# Patient Record
Sex: Male | Born: 1955 | Race: Black or African American | Hispanic: No | Smoking: Former smoker
Health system: Southern US, Community
[De-identification: ages and names within clinical notes are randomized; demographics above are authoritative.]

## PROBLEM LIST (undated history)

## (undated) DIAGNOSIS — N182 Chronic kidney disease, stage 2 (mild): Secondary | ICD-10-CM

## (undated) DIAGNOSIS — E785 Hyperlipidemia, unspecified: Secondary | ICD-10-CM

## (undated) DIAGNOSIS — I491 Atrial premature depolarization: Secondary | ICD-10-CM

## (undated) DIAGNOSIS — I219 Acute myocardial infarction, unspecified: Secondary | ICD-10-CM

## (undated) DIAGNOSIS — K859 Acute pancreatitis without necrosis or infection, unspecified: Secondary | ICD-10-CM

## (undated) DIAGNOSIS — G629 Polyneuropathy, unspecified: Secondary | ICD-10-CM

## (undated) DIAGNOSIS — G4730 Sleep apnea, unspecified: Secondary | ICD-10-CM

## (undated) DIAGNOSIS — K7460 Unspecified cirrhosis of liver: Secondary | ICD-10-CM

## (undated) DIAGNOSIS — I8393 Asymptomatic varicose veins of bilateral lower extremities: Secondary | ICD-10-CM

## (undated) DIAGNOSIS — E876 Hypokalemia: Secondary | ICD-10-CM

## (undated) DIAGNOSIS — I5032 Chronic diastolic (congestive) heart failure: Secondary | ICD-10-CM

## (undated) DIAGNOSIS — E119 Type 2 diabetes mellitus without complications: Secondary | ICD-10-CM

## (undated) DIAGNOSIS — I10 Essential (primary) hypertension: Secondary | ICD-10-CM

## (undated) DIAGNOSIS — IMO0002 Reserved for concepts with insufficient information to code with codable children: Secondary | ICD-10-CM

## (undated) DIAGNOSIS — R809 Proteinuria, unspecified: Secondary | ICD-10-CM

## (undated) DIAGNOSIS — I2510 Atherosclerotic heart disease of native coronary artery without angina pectoris: Secondary | ICD-10-CM

## (undated) DIAGNOSIS — I4510 Unspecified right bundle-branch block: Secondary | ICD-10-CM

## (undated) HISTORY — PX: CORONARY ARTERY BYPASS GRAFT: SHX141

## (undated) HISTORY — DX: Chronic kidney disease, stage 2 (mild): N18.2

## (undated) HISTORY — DX: Hypomagnesemia: E83.42

## (undated) HISTORY — DX: Chronic diastolic (congestive) heart failure: I50.32

## (undated) HISTORY — DX: Hyperlipidemia, unspecified: E78.5

## (undated) HISTORY — DX: Atrial premature depolarization: I49.1

## (undated) HISTORY — DX: Unspecified cirrhosis of liver: K74.60

## (undated) HISTORY — DX: Hypokalemia: E87.6

## (undated) HISTORY — DX: Atherosclerotic heart disease of native coronary artery without angina pectoris: I25.10

## (undated) HISTORY — DX: Proteinuria, unspecified: R80.9

## (undated) HISTORY — DX: Polyneuropathy, unspecified: G62.9

## (undated) HISTORY — DX: Unspecified right bundle-branch block: I45.10

## (undated) HISTORY — DX: Asymptomatic varicose veins of bilateral lower extremities: I83.93

## (undated) HISTORY — DX: Sleep apnea, unspecified: G47.30

## (undated) HISTORY — DX: Type 2 diabetes mellitus without complications: E11.9

## (undated) HISTORY — PX: OTHER SURGICAL HISTORY: SHX169

## (undated) HISTORY — DX: Acute myocardial infarction, unspecified: I21.9

## (undated) HISTORY — DX: Morbid (severe) obesity due to excess calories: E66.01

## (undated) HISTORY — DX: Acute pancreatitis without necrosis or infection, unspecified: K85.90

## (undated) HISTORY — DX: Reserved for concepts with insufficient information to code with codable children: IMO0002

## (undated) HISTORY — DX: Essential (primary) hypertension: I10

---

## 1997-06-12 ENCOUNTER — Encounter: Admission: RE | Admit: 1997-06-12 | Discharge: 1997-09-10 | Payer: Self-pay | Admitting: Endocrinology

## 1997-11-19 ENCOUNTER — Emergency Department (HOSPITAL_COMMUNITY): Admission: EM | Admit: 1997-11-19 | Discharge: 1997-11-19 | Payer: Self-pay | Admitting: Emergency Medicine

## 1997-11-20 ENCOUNTER — Ambulatory Visit (HOSPITAL_COMMUNITY): Admission: RE | Admit: 1997-11-20 | Discharge: 1997-11-20 | Payer: Self-pay | Admitting: Emergency Medicine

## 1997-12-05 ENCOUNTER — Emergency Department (HOSPITAL_COMMUNITY): Admission: EM | Admit: 1997-12-05 | Discharge: 1997-12-06 | Payer: Self-pay | Admitting: Emergency Medicine

## 1998-02-09 ENCOUNTER — Emergency Department (HOSPITAL_COMMUNITY): Admission: EM | Admit: 1998-02-09 | Discharge: 1998-02-09 | Payer: Self-pay | Admitting: Emergency Medicine

## 1998-04-18 ENCOUNTER — Encounter: Admission: RE | Admit: 1998-04-18 | Discharge: 1998-04-19 | Payer: Self-pay | Admitting: Internal Medicine

## 1998-07-25 ENCOUNTER — Encounter: Admission: RE | Admit: 1998-07-25 | Discharge: 1998-07-25 | Payer: Self-pay | Admitting: Internal Medicine

## 1998-09-28 ENCOUNTER — Encounter: Payer: Self-pay | Admitting: Emergency Medicine

## 1998-09-28 ENCOUNTER — Inpatient Hospital Stay (HOSPITAL_COMMUNITY): Admission: EM | Admit: 1998-09-28 | Discharge: 1998-10-01 | Payer: Self-pay | Admitting: Emergency Medicine

## 1998-09-29 ENCOUNTER — Encounter: Payer: Self-pay | Admitting: Endocrinology

## 1998-10-25 ENCOUNTER — Ambulatory Visit (HOSPITAL_COMMUNITY): Admission: RE | Admit: 1998-10-25 | Discharge: 1998-10-25 | Payer: Self-pay | Admitting: Endocrinology

## 1998-11-05 ENCOUNTER — Emergency Department (HOSPITAL_COMMUNITY): Admission: EM | Admit: 1998-11-05 | Discharge: 1998-11-05 | Payer: Self-pay | Admitting: Emergency Medicine

## 1998-11-05 ENCOUNTER — Encounter: Admission: RE | Admit: 1998-11-05 | Discharge: 1999-02-03 | Payer: Self-pay | Admitting: Orthopedic Surgery

## 1998-11-14 ENCOUNTER — Emergency Department (HOSPITAL_COMMUNITY): Admission: EM | Admit: 1998-11-14 | Discharge: 1998-11-14 | Payer: Self-pay | Admitting: Emergency Medicine

## 1998-11-15 ENCOUNTER — Encounter: Payer: Self-pay | Admitting: Emergency Medicine

## 1999-01-30 ENCOUNTER — Ambulatory Visit: Admission: RE | Admit: 1999-01-30 | Discharge: 1999-01-30 | Payer: Self-pay | Admitting: Internal Medicine

## 1999-02-19 ENCOUNTER — Encounter: Admission: RE | Admit: 1999-02-19 | Discharge: 1999-05-20 | Payer: Self-pay | Admitting: Orthopedic Surgery

## 1999-06-10 ENCOUNTER — Encounter: Admission: RE | Admit: 1999-06-10 | Discharge: 1999-09-08 | Payer: Self-pay | Admitting: Orthopedic Surgery

## 2000-08-06 ENCOUNTER — Encounter: Admission: RE | Admit: 2000-08-06 | Discharge: 2000-08-06 | Payer: Self-pay | Admitting: Internal Medicine

## 2000-09-21 ENCOUNTER — Encounter: Admission: RE | Admit: 2000-09-21 | Discharge: 2000-09-21 | Payer: Self-pay | Admitting: Internal Medicine

## 2000-09-30 ENCOUNTER — Encounter: Admission: RE | Admit: 2000-09-30 | Discharge: 2000-09-30 | Payer: Self-pay | Admitting: Internal Medicine

## 2000-11-17 ENCOUNTER — Encounter: Admission: RE | Admit: 2000-11-17 | Discharge: 2000-11-17 | Payer: Self-pay | Admitting: Internal Medicine

## 2001-02-04 ENCOUNTER — Emergency Department (HOSPITAL_COMMUNITY): Admission: EM | Admit: 2001-02-04 | Discharge: 2001-02-04 | Payer: Self-pay

## 2001-03-05 ENCOUNTER — Emergency Department (HOSPITAL_COMMUNITY): Admission: EM | Admit: 2001-03-05 | Discharge: 2001-03-05 | Payer: Self-pay | Admitting: Emergency Medicine

## 2001-03-05 ENCOUNTER — Encounter: Payer: Self-pay | Admitting: Emergency Medicine

## 2001-04-07 ENCOUNTER — Emergency Department (HOSPITAL_COMMUNITY): Admission: EM | Admit: 2001-04-07 | Discharge: 2001-04-07 | Payer: Self-pay | Admitting: Emergency Medicine

## 2001-04-07 ENCOUNTER — Encounter: Payer: Self-pay | Admitting: Emergency Medicine

## 2001-06-28 ENCOUNTER — Encounter (HOSPITAL_BASED_OUTPATIENT_CLINIC_OR_DEPARTMENT_OTHER): Admission: RE | Admit: 2001-06-28 | Discharge: 2001-07-01 | Payer: Self-pay | Admitting: Internal Medicine

## 2001-10-11 ENCOUNTER — Encounter (HOSPITAL_BASED_OUTPATIENT_CLINIC_OR_DEPARTMENT_OTHER): Admission: RE | Admit: 2001-10-11 | Discharge: 2002-01-09 | Payer: Self-pay | Admitting: Orthopedic Surgery

## 2001-11-19 ENCOUNTER — Emergency Department (HOSPITAL_COMMUNITY): Admission: EM | Admit: 2001-11-19 | Discharge: 2001-11-19 | Payer: Self-pay | Admitting: Emergency Medicine

## 2001-12-13 ENCOUNTER — Emergency Department (HOSPITAL_COMMUNITY): Admission: EM | Admit: 2001-12-13 | Discharge: 2001-12-14 | Payer: Self-pay | Admitting: Emergency Medicine

## 2001-12-14 ENCOUNTER — Encounter: Payer: Self-pay | Admitting: Emergency Medicine

## 2001-12-14 ENCOUNTER — Emergency Department (HOSPITAL_COMMUNITY): Admission: EM | Admit: 2001-12-14 | Discharge: 2001-12-14 | Payer: Self-pay | Admitting: Emergency Medicine

## 2002-03-02 ENCOUNTER — Encounter: Admission: RE | Admit: 2002-03-02 | Discharge: 2002-03-02 | Payer: Self-pay | Admitting: Internal Medicine

## 2002-05-09 ENCOUNTER — Emergency Department (HOSPITAL_COMMUNITY): Admission: EM | Admit: 2002-05-09 | Discharge: 2002-05-09 | Payer: Self-pay | Admitting: Emergency Medicine

## 2002-10-25 ENCOUNTER — Emergency Department (HOSPITAL_COMMUNITY): Admission: EM | Admit: 2002-10-25 | Discharge: 2002-10-25 | Payer: Self-pay | Admitting: Emergency Medicine

## 2003-01-19 ENCOUNTER — Encounter (HOSPITAL_BASED_OUTPATIENT_CLINIC_OR_DEPARTMENT_OTHER): Admission: RE | Admit: 2003-01-19 | Discharge: 2003-04-19 | Payer: Self-pay | Admitting: Internal Medicine

## 2003-01-26 ENCOUNTER — Encounter: Admission: RE | Admit: 2003-01-26 | Discharge: 2003-01-26 | Payer: Self-pay | Admitting: Internal Medicine

## 2003-02-11 DIAGNOSIS — I219 Acute myocardial infarction, unspecified: Secondary | ICD-10-CM

## 2003-02-11 HISTORY — DX: Acute myocardial infarction, unspecified: I21.9

## 2003-02-14 ENCOUNTER — Encounter: Admission: RE | Admit: 2003-02-14 | Discharge: 2003-02-14 | Payer: Self-pay | Admitting: Internal Medicine

## 2003-02-23 ENCOUNTER — Ambulatory Visit (HOSPITAL_BASED_OUTPATIENT_CLINIC_OR_DEPARTMENT_OTHER): Admission: RE | Admit: 2003-02-23 | Discharge: 2003-02-23 | Payer: Self-pay | Admitting: Internal Medicine

## 2003-03-01 ENCOUNTER — Inpatient Hospital Stay (HOSPITAL_COMMUNITY): Admission: AD | Admit: 2003-03-01 | Discharge: 2003-03-14 | Payer: Self-pay | Admitting: *Deleted

## 2003-09-20 ENCOUNTER — Encounter (HOSPITAL_BASED_OUTPATIENT_CLINIC_OR_DEPARTMENT_OTHER): Admission: RE | Admit: 2003-09-20 | Discharge: 2003-12-19 | Payer: Self-pay | Admitting: Internal Medicine

## 2003-12-19 ENCOUNTER — Encounter (HOSPITAL_BASED_OUTPATIENT_CLINIC_OR_DEPARTMENT_OTHER): Admission: RE | Admit: 2003-12-19 | Discharge: 2004-01-10 | Payer: Self-pay | Admitting: Internal Medicine

## 2004-01-02 ENCOUNTER — Ambulatory Visit: Payer: Self-pay | Admitting: *Deleted

## 2004-01-03 ENCOUNTER — Ambulatory Visit: Payer: Self-pay

## 2004-01-24 ENCOUNTER — Ambulatory Visit: Payer: Self-pay | Admitting: Cardiology

## 2004-02-08 ENCOUNTER — Ambulatory Visit: Payer: Self-pay | Admitting: *Deleted

## 2004-03-05 ENCOUNTER — Encounter (HOSPITAL_BASED_OUTPATIENT_CLINIC_OR_DEPARTMENT_OTHER): Admission: RE | Admit: 2004-03-05 | Discharge: 2004-04-19 | Payer: Self-pay | Admitting: Internal Medicine

## 2004-07-15 ENCOUNTER — Ambulatory Visit: Payer: Self-pay | Admitting: *Deleted

## 2004-07-18 ENCOUNTER — Ambulatory Visit: Payer: Self-pay | Admitting: *Deleted

## 2004-08-19 ENCOUNTER — Encounter: Admission: RE | Admit: 2004-08-19 | Discharge: 2004-08-19 | Payer: Self-pay | Admitting: Endocrinology

## 2004-11-11 ENCOUNTER — Ambulatory Visit: Payer: Self-pay | Admitting: Cardiology

## 2005-04-22 ENCOUNTER — Ambulatory Visit: Payer: Self-pay | Admitting: Cardiology

## 2005-11-04 ENCOUNTER — Ambulatory Visit: Payer: Self-pay | Admitting: Cardiovascular Disease

## 2005-11-17 ENCOUNTER — Ambulatory Visit: Payer: Self-pay

## 2005-11-27 ENCOUNTER — Ambulatory Visit: Payer: Self-pay | Admitting: Cardiology

## 2006-01-07 ENCOUNTER — Ambulatory Visit: Payer: Self-pay | Admitting: Cardiology

## 2006-04-13 ENCOUNTER — Ambulatory Visit: Payer: Self-pay | Admitting: Cardiology

## 2006-08-21 ENCOUNTER — Ambulatory Visit: Payer: Self-pay | Admitting: Internal Medicine

## 2006-12-24 ENCOUNTER — Ambulatory Visit: Payer: Self-pay | Admitting: Cardiology

## 2006-12-31 ENCOUNTER — Ambulatory Visit: Payer: Self-pay

## 2007-01-04 ENCOUNTER — Encounter: Payer: Self-pay | Admitting: Internal Medicine

## 2007-01-04 DIAGNOSIS — K7460 Unspecified cirrhosis of liver: Secondary | ICD-10-CM | POA: Insufficient documentation

## 2007-01-04 DIAGNOSIS — Z794 Long term (current) use of insulin: Secondary | ICD-10-CM

## 2007-01-04 DIAGNOSIS — E785 Hyperlipidemia, unspecified: Secondary | ICD-10-CM | POA: Insufficient documentation

## 2007-01-04 DIAGNOSIS — I10 Essential (primary) hypertension: Secondary | ICD-10-CM

## 2007-01-04 DIAGNOSIS — E1149 Type 2 diabetes mellitus with other diabetic neurological complication: Secondary | ICD-10-CM

## 2007-01-04 DIAGNOSIS — G4733 Obstructive sleep apnea (adult) (pediatric): Secondary | ICD-10-CM | POA: Insufficient documentation

## 2007-01-04 DIAGNOSIS — I219 Acute myocardial infarction, unspecified: Secondary | ICD-10-CM | POA: Insufficient documentation

## 2007-01-04 DIAGNOSIS — J40 Bronchitis, not specified as acute or chronic: Secondary | ICD-10-CM | POA: Insufficient documentation

## 2007-01-04 DIAGNOSIS — K859 Acute pancreatitis without necrosis or infection, unspecified: Secondary | ICD-10-CM | POA: Insufficient documentation

## 2007-02-08 ENCOUNTER — Ambulatory Visit: Payer: Self-pay | Admitting: Internal Medicine

## 2007-02-12 ENCOUNTER — Encounter: Payer: Self-pay | Admitting: Internal Medicine

## 2007-06-03 ENCOUNTER — Ambulatory Visit: Payer: Self-pay | Admitting: Internal Medicine

## 2007-06-24 ENCOUNTER — Encounter: Payer: Self-pay | Admitting: Internal Medicine

## 2007-09-02 ENCOUNTER — Encounter: Payer: Self-pay | Admitting: Internal Medicine

## 2007-12-23 ENCOUNTER — Ambulatory Visit: Payer: Self-pay | Admitting: Cardiology

## 2008-01-14 ENCOUNTER — Ambulatory Visit: Payer: Self-pay | Admitting: Cardiology

## 2008-09-07 ENCOUNTER — Telehealth: Payer: Self-pay | Admitting: Cardiology

## 2008-09-20 ENCOUNTER — Telehealth: Payer: Self-pay | Admitting: Cardiology

## 2008-09-26 ENCOUNTER — Telehealth: Payer: Self-pay | Admitting: Cardiology

## 2008-10-19 ENCOUNTER — Telehealth: Payer: Self-pay | Admitting: Internal Medicine

## 2008-10-19 ENCOUNTER — Encounter: Payer: Self-pay | Admitting: Internal Medicine

## 2008-10-20 ENCOUNTER — Ambulatory Visit: Payer: Self-pay | Admitting: Internal Medicine

## 2008-11-09 ENCOUNTER — Emergency Department (HOSPITAL_COMMUNITY): Admission: EM | Admit: 2008-11-09 | Discharge: 2008-11-09 | Payer: Self-pay | Admitting: Emergency Medicine

## 2008-11-14 ENCOUNTER — Encounter: Payer: Self-pay | Admitting: Internal Medicine

## 2008-12-25 DIAGNOSIS — I2510 Atherosclerotic heart disease of native coronary artery without angina pectoris: Secondary | ICD-10-CM

## 2008-12-26 ENCOUNTER — Ambulatory Visit: Payer: Self-pay | Admitting: Cardiology

## 2009-03-05 ENCOUNTER — Telehealth: Payer: Self-pay | Admitting: Cardiology

## 2009-04-05 ENCOUNTER — Inpatient Hospital Stay (HOSPITAL_COMMUNITY): Admission: EM | Admit: 2009-04-05 | Discharge: 2009-04-06 | Payer: Self-pay | Admitting: Emergency Medicine

## 2009-04-05 ENCOUNTER — Telehealth: Payer: Self-pay | Admitting: Cardiology

## 2009-04-05 ENCOUNTER — Encounter: Payer: Self-pay | Admitting: Cardiology

## 2009-04-05 ENCOUNTER — Ambulatory Visit: Payer: Self-pay | Admitting: Cardiovascular Disease

## 2009-04-30 ENCOUNTER — Ambulatory Visit: Payer: Self-pay | Admitting: Cardiology

## 2009-04-30 DIAGNOSIS — I739 Peripheral vascular disease, unspecified: Secondary | ICD-10-CM | POA: Insufficient documentation

## 2009-07-16 ENCOUNTER — Encounter: Admission: RE | Admit: 2009-07-16 | Discharge: 2009-07-16 | Payer: Self-pay | Admitting: Orthopedic Surgery

## 2009-10-19 ENCOUNTER — Telehealth (INDEPENDENT_AMBULATORY_CARE_PROVIDER_SITE_OTHER): Payer: Self-pay | Admitting: *Deleted

## 2009-10-23 ENCOUNTER — Telehealth (INDEPENDENT_AMBULATORY_CARE_PROVIDER_SITE_OTHER): Payer: Self-pay | Admitting: *Deleted

## 2009-10-26 ENCOUNTER — Encounter: Payer: Self-pay | Admitting: Internal Medicine

## 2009-12-27 ENCOUNTER — Ambulatory Visit: Payer: Self-pay | Admitting: Cardiology

## 2010-01-07 ENCOUNTER — Telehealth (INDEPENDENT_AMBULATORY_CARE_PROVIDER_SITE_OTHER): Payer: Self-pay

## 2010-01-08 ENCOUNTER — Ambulatory Visit: Payer: Self-pay | Admitting: Cardiology

## 2010-01-08 ENCOUNTER — Ambulatory Visit: Payer: Self-pay

## 2010-01-08 ENCOUNTER — Encounter: Payer: Self-pay | Admitting: Internal Medicine

## 2010-01-08 ENCOUNTER — Encounter: Payer: Self-pay | Admitting: *Deleted

## 2010-01-08 ENCOUNTER — Encounter (HOSPITAL_COMMUNITY)
Admission: RE | Admit: 2010-01-08 | Discharge: 2010-03-12 | Payer: Self-pay | Source: Home / Self Care | Attending: Cardiology | Admitting: Cardiology

## 2010-01-08 ENCOUNTER — Encounter: Payer: Self-pay | Admitting: Cardiology

## 2010-01-10 ENCOUNTER — Telehealth (INDEPENDENT_AMBULATORY_CARE_PROVIDER_SITE_OTHER): Payer: Self-pay | Admitting: Radiology

## 2010-01-10 LAB — CONVERTED CEMR LAB
Alkaline Phosphatase: 79 units/L (ref 39–117)
Bilirubin, Direct: 0.1 mg/dL (ref 0.0–0.3)
HDL: 40.9 mg/dL (ref >39.00)
LDL Cholesterol: 80 mg/dL (ref 0–99)
Total Bilirubin: 0.6 mg/dL (ref 0.3–1.2)
Total CHOL/HDL Ratio: 4
Total Protein: 7.3 g/dL (ref 6.0–8.3)
Triglycerides: 166 mg/dL — ABNORMAL HIGH (ref 0.0–149.0)
VLDL: 33.2 mg/dL (ref 0.0–40.0)

## 2010-01-21 ENCOUNTER — Encounter: Payer: Self-pay | Admitting: Cardiology

## 2010-03-03 ENCOUNTER — Encounter: Payer: Self-pay | Admitting: Orthopedic Surgery

## 2010-03-12 ENCOUNTER — Telehealth: Payer: Self-pay | Admitting: Cardiology

## 2010-03-12 NOTE — Progress Notes (Signed)
Summary: Nuclear results  Phone Note Call from Patient Call back at Home Phone [phone]   Caller: Patient Call For: MD Summary of Call: Pt called this AM wanting Stress test and Lab results. Told him Dr. Jenene Slicker nurse would be in touch. He stated it would be okay to leave results on Answering machine if he was not there. Initial call taken by: Leonia Corona, RT-N,  January 10, 2010 8:41 AM

## 2010-03-12 NOTE — Assessment & Plan Note (Signed)
Summary: Cardiology Nuclear Testing   Nuclear Med Background Indications for Stress Test: Evaluation for Ischemia, Graft Patency   History: Abnormal EKG, CABG, Heart Catheterization, Myocardial Infarction, Myocardial Perfusion Study  History Comments: '05 Cath>CABG x5. 11/20/05 MPS: (-) ischemia.  Symptoms: DOE, Palpitations, SOB    Nuclear Pre-Procedure Cardiac Risk Factors: Claudication, Family History - CAD, History of Smoking, Hypertension, IDDM Type 2, Lipids, PVD, RBBB Caffeine/Decaff Intake: 8:30 PM  NPO After: 11:00 PM Lungs: clear IV 0.9% NS with Angio Cath: 22g     IV Site: R Antecubital IV Started by: Irean Hong, RN Chest Size (in) 52     Height (in): 71 Weight (lb): 285 BMI: 39.89 Tech Comments: Held metoprolol this am.   Nuclear Med Study 1 or 2 day study:  1 day     Stress Test Type:  Treadmill/Lexiscan Reading MD:  Cassell Clement, MD     Referring MD:  J.Hochrein Resting Radionuclide:  Technetium 8m Tetrofosmin     Resting Radionuclide Dose:  11.0 mCi  Stress Radionuclide:  Technetium 29m Tetrofosmin     Stress Radionuclide Dose:  33.0 mCi   Stress Protocol  Max Systolic BP: 181 mm Hg Lexiscan: 0.4 mg   Stress Test Technologist:  Milana Na, EMT-P     Nuclear Technologist:  Doyne Keel, CNMT  Rest Procedure  Myocardial perfusion imaging was performed at rest 45 minutes following the intravenous administration of Technetium 38m Tetrofosmin.  Stress Procedure  The patient received IV Lexiscan 0.4 mg over 15-seconds with concurrent low level exercise and then Technetium 26m Tetrofosmin was injected at 30-seconds while the patient continued walking one more minute.  There were no significant changes and rare pacs with Lexiscan.  Quantitative spect images were obtained after a 45 minute delay.  QPS Raw Data Images:  Normal; no motion artifact; normal heart/lung ratio. Stress Images:  Normal homogeneous uptake in all areas of the myocardium. Rest  Images:  Normal homogeneous uptake in all areas of the myocardium. Subtraction (SDS):  No evidence of ischemia. Transient Ischemic Dilatation:  1.07  (Normal <1.22)  Lung/Heart Ratio:  0.34  (Normal <0.45)  Quantitative Gated Spect Images QGS EDV:  115 ml QGS ESV:  48 ml QGS EF:  58 % QGS cine images:  Normal LV systolic function.  Findings Normal nuclear study      Overall Impression  Exercise Capacity: Lexiscan with mild exercise. BP Response: Normal blood pressure response. Clinical Symptoms: No chest pain ECG Impression: No significant ST segment change suggestive of ischemia.  RBBB. Overall Impression: Normal stress nuclear study. Overall Impression Comments: No ischemia.  Normal LV systolic function.  Appended Document: Cardiology Nuclear Testing  Called patient and left message on machine of normal results

## 2010-03-12 NOTE — Assessment & Plan Note (Signed)
Summary: 1 yr      Allergies Added:   Visit Type:  Follow-up  CC:  CAD.  History of Present Illness: The patient presents for followup of his known coronary disease. He was hospitalized earlier this year with chest pain but ruled out. He has not had a stress perfusion study in over 4 years. His bypass grafts are now 57 years old. He is not exercising as I have encouraged him to do. With his activities of daily living he is not getting chest pressure, neck or arm discomfort. He is not having any new palpitations, presyncope or syncope. He is having no new PND or orthopnea. He has had slowly progressive weight gain. He does have leg pain when he walks. This is particularly in left side in the popliteal region. He had normal ABIs in 2009.  Current Medications (verified): 1)  Cozaar 50 Mg Tabs (Losartan Potassium) .... Take One Tablet By Mouth Daily 2)  Aspirin Ec 325 Mg Tbec (Aspirin) .... Take One Tablet By Mouth Daily 3)  Inspra 25 Mg  Tabs (Eplerenone) .Marland Kitchen.. 1 By Mouth Daily 4)  Simvastatin 80 Mg  Tabs (Simvastatin) .... Take 1 Tablet By Mouth Once A Day 5)  Furosemide 20 Mg Tabs (Furosemide) .... Take 1 Tablet By Mouth Two Times A Day 6)  Humalog Mix 75/25 Pen 75-25 % Susp (Insulin Lispro Prot & Lispro) .... As Directed 7)  Nitrostat 0.4 Mg Subl (Nitroglycerin) .Marland Kitchen.. 1 Tablet Under Tongue At Onset of Chest Pain; You May Repeat Every 5 Minutes For Up To 3 Doses. 8)  Metoprolol Tartrate 50 Mg Tabs (Metoprolol Tartrate) .... Take 1 Tablet By Mouth Once A Day 9)  Bipap 12/ 5 [facility] 10)  Lantus 100 Unit/ml Soln (Insulin Glargine) .... As Directed 11)  Replacement Bpap Mask, Filter and Supplies.  Allergies (verified): 1)  ! Ibuprofen  Past History:  Past Medical History: Reviewed history from 12/25/2008 and no changes required. CAD (ICD-414.00 (status post CABG in 2005 with a LIMA to       the LAD, left radial to second circumflex marginal, saphenous vein       graft to PDA, saphenous vein  graft to lateral subbranch of the       ramus intermediate, and sequential saphenous vein graft to the       medial subbranch of the ramus intermediate).  PANCREATITIS (ICD-577.0) CIRRHOSIS (ICD-571.5) DM (ICD-250.00) BRONCHITIS (ICD-490) HYPERLIPIDEMIA (ICD-272.4) SLEEP APNEA (ICD-780.57) MI (ICD-410.90) HYPERTENSION NEC (ICD-997.91)  Past Surgical History: Reviewed history from 01/04/2007 and no changes required. ENT Surgery Cornary Bypass Graft  Review of Systems       As stated in the HPI and negative for all other systems.   Vital Signs:  Patient profile:   55 year old male Height:      71 inches Weight:      282 pounds BMI:     39.47 Pulse rate:   70 / minute Resp:     18 per minute BP sitting:   102 / 68  (right arm)  Vitals Entered By: Marrion Coy, CNA (December 27, 2009 10:12 AM)  Physical Exam  General:  Well developed, well nourished, in no acute distress. Head:  normocephalic and atraumatic Eyes:  PERRLA/EOM intact; conjunctiva and lids normal. Mouth:  Poor dentition. Oral mucosa normal. Neck:  Neck supple, no JVD. No masses, thyromegaly or abnormal cervical nodes. Chest Wall:  Well healed sternotomy scar Lungs:  Clear bilaterally to auscultation and percussion. Abdomen:  Bowel  sounds positive; abdomen soft and non-tender without masses, organomegaly, or hernias noted. No hepatosplenomegaly. obese Msk:  Back normal, normal gait. Muscle strength and tone normal. Extremities:  No clubbing or cyanosis. Neurologic:  Alert and oriented x 3. Skin:  Intact without lesions or rashes. Psych:  Normal affect.   Detailed Cardiovascular Exam  Neck    Carotids: Carotids full and equal bilaterally without bruits.      Neck Veins: Normal, no JVD.    Heart    Inspection: no deformities or lifts noted.      Palpation: normal PMI with no thrills palpable.      Auscultation: regular rate and rhythm, S1, S2 without murmurs, rubs, gallops, or clicks.    Vascular     Abdominal Aorta: no palpable masses, pulsations, or audible bruits.      Femoral Pulses: normal femoral pulses bilaterally.      Pedal Pulses: diminished left dorsalis pedis pulse and diminished left posterior tibial pulse.      Radial Pulses: absent left radial pulse.      Peripheral Circulation: no clubbing, cyanosis, or edema noted with normal capillary refill.     EKG  Procedure date:  12/27/2009  Findings:      Normal sinus rhythm, rate 77, axis within normal limits, and intervals within normal limits, left ventricular hypertrophy by voltage criteria, and QT prolonged, nonspecific T-wave changes  Impression & Recommendations:  Problem # 1:  CAD (ICD-414.00) The patient is having no new symptoms. However, he is not particularly active. I want him to be on an exercise regimen. He needs screening with a stress test prior to starting this given his past history. However, he says he would not be able to walk on a treadmill. Therefore, he will have a pharmacologic stress perfusion study. Orders: Nuclear Stress Test (Nuc Stress Test) EKG w/ Interpretation (93000)  Problem # 2:  OBESITY, UNSPECIFIED (ICD-278.00) We had a long discussion about the need for weight loss with diet and exercise.  Problem # 3:  PVD (ICD-443.9) He does have palpable pulses with slightly diminished bilaterally. He had normal ABIs 2009. I am going to encourage walking first and if he gets worse I will screen with further imaging.  Problem # 4:  DM (ICD-250.00) His primary physician is no longer working. I have given him the name of one of our physicians to follow up with as a new patient to manage his diabetes and other primary care issues.  Problem # 5:  HYPERLIPIDEMIA (ICD-272.4) I will check a lipid profile. He has been on 80 mg of simvastatin for greater than one year and has no symptoms and can continue this. I will check liver enzymes.  Problem # 6:  HYPERTENSION NEC (ICD-997.91) His blood pressure is  controlled. He will continue the meds as listed. Orders: EKG w/ Interpretation (93000)  Patient Instructions: 1)  Your physician recommends that you schedule a follow-up appointment in: 12 months with Dr Antoine Poche 2)  Your physician recommends that you return for a FASTING lipid and liver profile: same day as stress test  272.0 v58.69 3)  Your physician recommends that you continue on your current medications as directed. Please refer to the Current Medication list given to you today. 4)  You have been referred to [facility] [address] to establish 5)  Your physician has requested that you have an adenosine myoview.  For further information please visit [email or web address]  Please follow instruction sheet, as given.

## 2010-03-12 NOTE — Progress Notes (Signed)
Summary: tightness in chest   Phone Note Call from Patient Call back at Home Phone [phone]   Caller: Patient Reason for Call: Talk to Nurse Details for Reason: per pt calling, c/o tightness by breast area, gas pocket,  Initial call taken by: Lorne Skeens,  April 05, 2009 4:48 PM  Follow-up for Phone Call        spoke w/pt has been having chest tightness under left breast all day, he is not sure if it's gas or his heart, but he is very concerned, he has had a bowel movement today, pt feels if it gets worse he may go to ER advised he should go ahead on over and get checked out, pt agreeable, Bjorn Loser is aware, note faxed to ER Meredith Staggers, RN  April 05, 2009 4:55 PM

## 2010-03-12 NOTE — Progress Notes (Signed)
Summary: bi-pap supplies  Phone Note Call from Patient Call back at [phone]   Caller: Patient Call For: young Summary of Call: need bi-pap prescript [facility] to [facility] Initial call taken by: Rickard Patience,  October 23, 2009 2:56 PM  Follow-up for Phone Call        RX signed by CDY and faxed to [facility] at [phone].Marland Kitchen Pt is aware.Michel Bickers [facility]  October 23, 2009 3:29 PM    New/Updated Medications: * REPLACEMENT BPAP MASK, FILTER AND SUPPLIES.  Prescriptions: REPLACEMENT BPAP MASK, FILTER AND SUPPLIES.   #1 x 0   Entered by:   Michel Bickers CMA   Authorized by:   Waymon Budge MD   Signed by:   Michel Bickers CMA on 10/23/2009   Method used:   Printed then faxed to ...       Chief Executive Officer Environmental education officer)       [address]       [place], [place]  [address]       Ph: [phone]       Fax: [phone]   RxID:   [id number] REPLACEMENT CPAP MASK, FILTER AND SUPPLIES.   #1 x 0   Entered by:   Michel Bickers CMA   Authorized by:   Waymon Budge MD   Signed by:   Michel Bickers CMA on 10/23/2009   Method used:   Printed then faxed to ...       Chief Executive Officer Environmental education officer)       [address]       [place], [place]  [address]       Ph: [phone]       Fax: [phone]   RxID:   [id number]   Appended Document: Orders Update- using BiPAP 12/5 [facility] Medications Added * BIPAP 12/ 5 [facility]        Update med list   Clinical Lists Changes  Medications: Changed medication from * CPAP [facility] unsure of setting.. to * BIPAP 12/ 5 [facility]

## 2010-03-12 NOTE — Letter (Signed)
Summary: ER Notification  Architectural technologist, Main Office  [address]   [place], [place] [address]   Phone: [phone]  Fax: [phone]    April 05, 2009 4:56 PM  [name] [name]  The above referenced patient has been advised to report directly to the Emergency Room. Please see below for more information:  Dx: ____chest tightness________     Private Vehicle  ______xxx_________ or EMS:  ________________   Orders:  Yes ______ or No  __xxx_____   Notify upon arrival:    Trish [phone]       Or _________________   Thank you,    [facility]

## 2010-03-12 NOTE — Progress Notes (Signed)
Summary: rx for bipap supplies  Phone Note Call from Patient Call back at Home Phone [phone]   Caller: Patient Call For: young Reason for Call: Talk to Nurse Summary of Call: pt needs rx for bipap supplies...mask, filters, etc/...  Send order/rx to [facility] Initial call taken by: Eugene Gavia,  October 19, 2009 11:02 AM  Follow-up for Phone Call        Spoke with pt's brother.  He states that [place] needs new order for bipap mask and filter.  Pt was last seen 9/10' and never followed up.  Is it okay to send order now as long as he schedules an rov? Pls advise thanks Follow-up by: Vernie Murders,  October 19, 2009 11:27 AM  Additional Follow-up for Phone Call Additional follow up Details #1::        OK- please get the appointment made. I am passing this on to [facility] I put order on med list Additional Follow-up by: Waymon Budge MD,  October 19, 2009 1:04 PM    Additional Follow-up for Phone Call Additional follow up Details #2::    order faxed to [facility] for cpap supplies  Follow-up by: Oneita Jolly,  October 19, 2009 3:34 PM  New/Updated Medications: * REPLACEMENT CPAP MASK, FILTER AND SUPPLIES.

## 2010-03-12 NOTE — Assessment & Plan Note (Signed)
Summary: eph/jss  Medications Added LANTUS 100 UNIT/ML SOLN (INSULIN GLARGINE) as directed        Visit Type:  Follow-up Primary Provider:  Dr. Joselyn Arrow  CC:  CAD.  History of Present Illness: The patient presents for followup of known coronary disease. Since I last saw him he was hospitalized overnight and ruled out for a myocardial infarction. Since that time he has had no further chest discomfort. He denies any neck or arm discomfort. Said no palpitations, presyncope or syncope. He walks to his neighbors house routinely and with this has had no symptoms. He says is about 3 blocks and involves a hill. He denies any resting shortness of breath and has had no PND or orthopnea.  Problems Prior to Update: 1)  Obesity, Unspecified  (ICD-278.00) 2)  Cad  (ICD-414.00) 3)  Pancreatitis  (ICD-577.0) 4)  Cirrhosis  (ICD-571.5) 5)  Dm  (ICD-250.00) 6)  Bronchitis  (ICD-490) 7)  Hyperlipidemia  (ICD-272.4) 8)  Sleep Apnea  (ICD-780.57) 9)  Mi  (ICD-410.90) 10)  Hypertension Nec  (ICD-997.91)  Current Medications (verified): 1)  Cozaar 50 Mg Tabs (Losartan Potassium) .... Take One Tablet By Mouth Daily 2)  Aspirin Ec 325 Mg Tbec (Aspirin) .... Take One Tablet By Mouth Daily 3)  Inspra 25 Mg  Tabs (Eplerenone) .Marland Kitchen.. 1 By Mouth Daily 4)  Simvastatin 80 Mg  Tabs (Simvastatin) .... Take 1 Tablet By Mouth Once A Day 5)  Furosemide 20 Mg Tabs (Furosemide) .... Take 1 Tablet By Mouth Two Times A Day 6)  Humalog Mix 75/25 Pen 75-25 % Susp (Insulin Lispro Prot & Lispro) .... As Directed 7)  Nitrostat 0.4 Mg Subl (Nitroglycerin) .Marland Kitchen.. 1 Tablet Under Tongue At Onset of Chest Pain; You May Repeat Every 5 Minutes For Up To 3 Doses. 8)  Metoprolol Tartrate 50 Mg Tabs (Metoprolol Tartrate) .... Take 1 Tablet By Mouth Once A Day 9)  Cpap [facility] .... Unsure of Setting.. 10)  Lantus 100 Unit/ml Soln (Insulin Glargine) .... As Directed  Allergies: 1)  ! Ibuprofen  Past History:  Past Medical  History: Reviewed history from 12/25/2008 and no changes required. CAD (ICD-414.00 (status post CABG in 2005 with a LIMA to       the LAD, left radial to second circumflex marginal, saphenous vein       graft to PDA, saphenous vein graft to lateral subbranch of the       ramus intermediate, and sequential saphenous vein graft to the       medial subbranch of the ramus intermediate).  PANCREATITIS (ICD-577.0) CIRRHOSIS (ICD-571.5) DM (ICD-250.00) BRONCHITIS (ICD-490) HYPERLIPIDEMIA (ICD-272.4) SLEEP APNEA (ICD-780.57) MI (ICD-410.90) HYPERTENSION NEC (ICD-997.91)  Past Surgical History: Reviewed history from 01/04/2007 and no changes required. ENT Surgery Cornary Bypass Graft  Review of Systems       As stated in the HPI and negative for all other systems.   Vital Signs:  Patient profile:   55 year old male Height:      71 inches Weight:      280 pounds BMI:     39.19 Pulse rate:   78 / minute BP sitting:   106 / 69  (right arm) Cuff size:   large  Vitals Entered By: Oswald Hillock (April 30, 2009 4:53 PM)  Physical Exam  General:  Well developed, well nourished, in no acute distress. Head:  normocephalic and atraumatic Eyes:  PERRLA/EOM intact; conjunctiva and lids normal. Mouth:  Teeth, gums and palate normal. Oral mucosa  normal. Neck:  Neck supple, no JVD. No masses, thyromegaly or abnormal cervical nodes. Chest Wall:  Well healed sternotomy scar Lungs:  Clear bilaterally to auscultation and percussion. Heart:  regular rate and rhythm, S1, S2 without murmurs, rubs, gallops, or clicks Abdomen:  Bowel sounds positive; abdomen soft and non-tender without masses, organomegaly, or hernias noted. No hepatosplenomegaly. obese Msk:  Back normal, normal gait. Muscle strength and tone normal. Pulses:  diminished left dorsalis pedis pulse and diminished left posterior tibial pulse.   Extremities:  No clubbing or cyanosis. Neurologic:  Alert and oriented x 3. Skin:   Intact without lesions or rashes. Psych:  Normal affect.   Impression & Recommendations:  Problem # 1:  CAD (ICD-414.00) The patient has had no new symptoms. Since I last saw him he is perhaps precipitating slightly more aggressively and risk reduction. At this point no further cardiovascular testing is suggested. Orders: EKG w/ Interpretation (93000)  Problem # 2:  OBESITY, UNSPECIFIED (ICD-278.00) I gave him specific instructions on how to lose weight. Hopefully he will comply.  Problem # 3:  HYPERTENSION NEC (ICD-997.91) His blood pressure is controlled on the meds as listed. He should at weight loss to his blood pressure treatment.  Problem # 4:  HYPERLIPIDEMIA (ICD-272.4) I reviewed the lipids that were done in the hospital. He is at target at his lipids. He would not be able to afford therapeutic equivalent doses of Lipitor or Crestor so he will remain on the simvastatin despite the black box warning.  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 He does get some claudication but it has been quite stable. At the time he did see Dr. Clovis Riley and it was suggested that he continue conservative management. He will continue with this.  Patient Instructions: 1)  Your physician recommends that you schedule a follow-up appointment in: 1 year with Dr Antoine Poche 2)  Your physician recommends that you continue on your current medications as directed. Please refer to the Current Medication list given to you today.

## 2010-03-12 NOTE — Letter (Signed)
Summary: LMN/[facility]  LMN/[facility]   Imported By: Lester Hartford 11/01/2009 11:30:31  _____________________________________________________________________  External Attachment:    Type:   Image     Comment:   External Document

## 2010-03-12 NOTE — Progress Notes (Signed)
Summary: CALLING ABOUT MEDICATION  Medications Added FUROSEMIDE 20 MG TABS (FUROSEMIDE) Take 1 tablet by mouth two times a day       Phone Note Call from Patient Call back at [phone]   Caller: Patient Summary of Call: PT CALLING ABOUT MEDICATIONS Initial call taken by: Judie Grieve,  March 05, 2009 2:28 PM  Follow-up for Phone Call        Returning pt's call with no answer. Marrion Coy, CNA  March 05, 2009 2:48 PM    Follow-up by: Marrion Coy, CNA,  March 05, 2009 2:48 PM  Additional Follow-up for Phone Call Additional follow up Details #1::        per pt calling back phone # [phone] Lorne Skeens  March 07, 2009 11:57 AM   spoke w/pt wants furosemide prescription for 20mg  two times a day, which is what he has been taking, will send in Ben Bolt, RN  March 07, 2009 12:11 PM     New/Updated Medications: FUROSEMIDE 20 MG TABS (FUROSEMIDE) Take 1 tablet by mouth two times a day Prescriptions: FUROSEMIDE 20 MG TABS (FUROSEMIDE) Take 1 tablet by mouth two times a day  #180 x 3   Entered by:   Meredith Staggers, RN   Authorized by:   Rollene Rotunda, MD, [facility]   Signed by:   Meredith Staggers, RN on 03/07/2009   Method used:   Faxed to ...       Chief Executive Officer Environmental education officer)       [address]       [place], [place]  [address]       Ph: [phone]       Fax: [phone]   RxID:   [id number]

## 2010-03-12 NOTE — Progress Notes (Signed)
Summary: Nuc. Pre-Procedure  Phone Note Outgoing Call Call back at [facility] Phone [phone]   Call placed by: Irean Hong, RN,  January 07, 2010 2:50 PM Summary of Call: Left message with information on Myoview Information Sheet (see scanned document for details).      Nuclear Med Background Indications for Stress Test: Evaluation for Ischemia, Graft Patency   History: CABG, Heart Catheterization, Myocardial Infarction, Myocardial Perfusion Study  History Comments: '05 Cath>CABG x5. 11/20/05 MPS: (-) ischemia.     Nuclear Pre-Procedure Cardiac Risk Factors: Claudication, Family History - CAD, History of Smoking, Hypertension, IDDM Type 2, Lipids, PVD, RBBB Height (in): 71

## 2010-03-14 NOTE — Letter (Signed)
Summary: Custom - Lipid  [facility], Main Office  [address]   [place], [place] [address]   Phone: [phone]  Fax: [phone]         January 21, 2010 MRN: [id number]     [name] [name] [address] [place], [place]  [address]     Dear [name],  We have reviewed your cholesterol results.  They are as follows:     Total Cholesterol:    154 (Desirable: less than 200)       HDL  Cholesterol:     40.90  (Desirable: greater than 40 for men and 50 for women)       LDL Cholesterol:       80  (Desirable: less than 100 for low risk and less than 70 for moderate to high risk)       Triglycerides:       166.0  (Desirable: less than 150)  Our recommendations include:  No changes in medications or treatment  Call our office at the number listed above if you have any questions.  Lowering your LDL cholesterol is important, but it is only one of a large number of "risk factors" that may indicate that you are at risk for heart disease, stroke or other complications of hardening of the arteries.  Other risk factors include:   A.  Cigarette Smoking* B.  High Blood Pressure* C.  Obesity* D.   Low HDL Cholesterol (see yours above)* E.   Diabetes Mellitus (higher risk if your is uncontrolled) F.  Family history of premature heart disease G.  Previous history of stroke or cardiovascular disease        *These are risk factors YOU HAVE CONTROL OVER.  For more information, visit .  There is now evidence that lowering the TOTAL CHOLESTEROL AND LDL CHOLESTEROL can reduce the risk of heart disease.  The American Heart Association recommends the following guidelines for the treatment of elevated cholesterol:  1.  If there is now current heart disease and less than two risk factors, TOTAL CHOLESTEROL should be less than 200 and LDL CHOLESTEROL should be less than 100. 2.  If there is current heart disease or two or more risk factors, TOTAL CHOLESTEROL should be less  than 200 and LDL CHOLESTEROL should be less than 70.  A diet low in cholesterol, saturated fat, and calories is the cornerstone of treatment for elevated cholesterol.  Cessation of smoking and exercise are also important in the management of elevated cholesterol and preventing vascular disease.  Studies have shown that 30 to 60 minutes of physical activity most days can help lower blood pressure, lower cholesterol, and keep your weight at a healthy level.  Drug therapy is used when cholesterol levels do not respond to therapeutic lifestyle changes (smoking cessation, diet, and exercise) and remains unacceptably high.  If medication is started, it is important to have you levels checked periodically to evaluate the need for further treatment options.      Thank you,       Sander Nephew, RN for Dr Rollene Rotunda [facility] Team

## 2010-03-20 NOTE — Progress Notes (Signed)
Summary: rx refill/PT RETURNING YOUR CALL   Phone Note From Pharmacy   Caller: [facility] Summary of Call: pt needs a new prescription for COZAAR 50 MG TABS fax to #[phone] Initial call taken by: Roe Coombs,  March 12, 2010 8:43 AM  Follow-up for Phone Call        [facility] for pt to call back. Pt RX was sent in on 03/08/10. Will clarify with pt.  Marrion Coy, CNA  March 13, 2010 2:15 PM  Follow-up by: Marrion Coy, CNA,  March 13, 2010 2:15 PM  Additional Follow-up for Phone Call Additional follow up Details #1::        PT Lorelei Pont PT # [phone] Additional Follow-up by: Roe Coombs,  March 14, 2010 10:17 AM    Additional Follow-up for Phone Call Additional follow up Details #2::    Spoke with  pt. Pt is out of meds. Will send in RX to local pharmacy. Marrion Coy, CNA  March 14, 2010 12:30 PM  Follow-up by: Marrion Coy, CNA,  March 14, 2010 12:30 PM  Prescriptions: COZAAR 50 MG TABS (LOSARTAN POTASSIUM) Take one tablet by mouth daily  #30 x 2   Entered by:   Marrion Coy, CNA   Authorized by:   Rollene Rotunda, MD, [facility]   Signed by:   Marrion Coy, CNA on 03/14/2010   Method used:   Electronically to        [facility] [address] (retail)       [address]       [place], [place]  [id number]       Ph: [phone]       Fax: [phone]   RxID:   [id number]

## 2010-05-01 LAB — CBC
HCT: 42.6 % (ref 39.0–52.0)
MCV: 97.3 fL (ref 78.0–100.0)
MCV: 97.8 fL (ref 78.0–100.0)
Platelets: 163 10*3/uL (ref 150–400)
Platelets: 176 10*3/uL (ref 150–400)
RBC: 4.71 MIL/uL (ref 4.22–5.81)
RDW: 13.8 % (ref 11.5–15.5)
WBC: 7.7 10*3/uL (ref 4.0–10.5)

## 2010-05-01 LAB — GLUCOSE, CAPILLARY: Glucose-Capillary: 198 mg/dL — ABNORMAL HIGH (ref 70–99)

## 2010-05-01 LAB — DIFFERENTIAL
Basophils Relative: 1 % (ref 0–1)
Basophils Relative: 1 % (ref 0–1)
Eosinophils Absolute: 0.2 10*3/uL (ref 0.0–0.7)
Lymphocytes Relative: 45 % (ref 12–46)
Lymphs Abs: 3.2 10*3/uL (ref 0.7–4.0)
Lymphs Abs: 4.1 10*3/uL — ABNORMAL HIGH (ref 0.7–4.0)
Monocytes Absolute: 0.7 10*3/uL (ref 0.1–1.0)
Monocytes Relative: 10 % (ref 3–12)
Monocytes Relative: 10 % (ref 3–12)
Neutro Abs: 2.7 10*3/uL (ref 1.7–7.7)
Neutro Abs: 3.1 10*3/uL (ref 1.7–7.7)
Neutrophils Relative %: 34 % — ABNORMAL LOW (ref 43–77)
Neutrophils Relative %: 43 % (ref 43–77)

## 2010-05-01 LAB — URINALYSIS, ROUTINE W REFLEX MICROSCOPIC
Glucose, UA: NEGATIVE mg/dL
Hgb urine dipstick: NEGATIVE
Protein, ur: NEGATIVE mg/dL
Specific Gravity, Urine: 1.016 (ref 1.005–1.030)
Urobilinogen, UA: 1 mg/dL (ref 0.0–1.0)

## 2010-05-01 LAB — RAPID URINE DRUG SCREEN, HOSP PERFORMED: Tetrahydrocannabinol: NOT DETECTED

## 2010-05-01 LAB — POCT CARDIAC MARKERS
CKMB, poc: 1.7 ng/mL (ref 1.0–8.0)
Troponin i, poc: <0.05 ng/mL (ref 0.00–0.09)

## 2010-05-01 LAB — HEPARIN LEVEL (UNFRACTIONATED): Heparin Unfractionated: 0.11 IU/mL — ABNORMAL LOW (ref 0.30–0.70)

## 2010-05-01 LAB — CARDIAC PANEL(CRET KIN+CKTOT+MB+TROPI)
Relative Index: 1.3 (ref 0.0–2.5)
Total CK: 140 U/L (ref 7–232)
Troponin I: 0.03 ng/mL (ref 0.00–0.06)

## 2010-05-01 LAB — BASIC METABOLIC PANEL
BUN: 10 mg/dL (ref 6–23)
Calcium: 9.2 mg/dL (ref 8.4–10.5)
Creatinine, Ser: 1.03 mg/dL (ref 0.4–1.5)
GFR calc Af Amer: >60 mL/min (ref >60)
GFR calc non Af Amer: >60 mL/min (ref >60)

## 2010-05-01 LAB — CK TOTAL AND CKMB (NOT AT ARMC): Relative Index: 1.3 (ref 0.0–2.5)

## 2010-05-01 LAB — LIPID PANEL
HDL: 44 mg/dL (ref >39)
LDL Cholesterol: 84 mg/dL (ref 0–99)
Total CHOL/HDL Ratio: 3.5 RATIO
Triglycerides: 118 mg/dL (ref <150)
VLDL: 24 mg/dL (ref 0–40)

## 2010-05-01 LAB — URINE MICROSCOPIC-ADD ON

## 2010-05-01 LAB — TROPONIN I: Troponin I: 0.01 ng/mL (ref 0.00–0.06)

## 2010-05-08 ENCOUNTER — Other Ambulatory Visit: Payer: Self-pay | Admitting: Cardiology

## 2010-05-09 NOTE — Telephone Encounter (Signed)
[address] °

## 2010-05-11 ENCOUNTER — Other Ambulatory Visit: Payer: Self-pay | Admitting: *Deleted

## 2010-05-11 MED ORDER — METOPROLOL TARTRATE 50 MG PO TABS: 50.0000 mg | ORAL_TABLET | Freq: Every day | ORAL | Status: DC

## 2010-06-19 ENCOUNTER — Other Ambulatory Visit: Payer: Self-pay | Admitting: *Deleted

## 2010-06-19 MED ORDER — EPLERENONE 25 MG PO TABS: 25.0000 mg | ORAL_TABLET | Freq: Every day | ORAL | Status: DC

## 2010-06-25 NOTE — Assessment & Plan Note (Signed)
[name] [name]                            CARDIOLOGY OFFICE NOTE   [name] [name], [name] [name]                      MRN:          [id number]  DATE:12/23/2007                            DOB:          09/16/1955    PRIMARY CARE PHYSICIAN:  Lavonda Jumbo, MD   REASON FOR PRESENTATION:  The patient with coronary artery disease.   HISTORY OF PRESENT ILLNESS:  The patient is a pleasant 55 year old  African American gentleman with coronary artery disease as described  below.  He returns for yearly followup.  Since I last saw him, he has  done well.  He has had no new cardiovascular symptoms.  He never really  had symptoms that he recalls in the first place.  However, he remains  active.  He states he walks about half hour a day.  With this, he denies  any chest discomfort, neck or arm discomfort.  He has had no  palpitation, presyncope, or syncope.  He has had no PND or orthopnea.  He continues to maintain his weight and does not really restrict his  calories.  He has his diabetes followed by diabetologist as well as Dr.  Lynelle Doctor.  He states his lipids are also well controlled.   PAST MEDICAL HISTORY:  1. Coronary artery disease (status post CABG in 2005 with a LIMA to      the LAD, left radial to second circumflex marginal, saphenous vein      graft to PDA, saphenous vein graft to lateral subbranch of the      ramus intermediate, and sequential saphenous vein graft to the      medial subbranch of the ramus intermediate).  2. Sleep apnea (he reports he does use a CPAP).  3. Diabetes mellitus.  4. Nephropathy.  5. Distal SFA occlusion (0.98 ABI on the right, 0.69 on the left).  6. Dyslipidemia.  7. Morbid obesity.   ALLERGIES/INTOLERANCES:  IBUPROFEN.   MEDICATIONS:  1. Cozaar 100 mg daily.  2. Aspirin 325 mg daily.  3. Insulin.  4. Keppra 25 mg daily.  5. Inspra 25 mg daily.  6. Furosemide 40 mg daily.  7. Metoprolol 50 mg daily.  8. Simvastatin 80 mg  daily.   REVIEW OF SYSTEMS:  As stated in HPI, otherwise negative for other  systems.   PHYSICAL EXAMINATION:  GENERAL:  The patient is in no distress.  VITAL SIGNS:  Blood pressure 105/67, heart rate 66 and regular, weight  272 pounds, and body mass index 38.  HEENT:  Eyes unremarkable.  Pupils are equal, round, and reactive to  light.  Fundi not visualized.  Oral mucosa normal.  NECK:  No jugular vein distention, 45 degrees and carotid upstroke brisk  and symmetrical.  No bruits.  No thyromegaly.  LYMPHATICS:  No cervical, axillary, or inguinal adenopathy.  LUNGS:  Clear to auscultation bilaterally.  BACK:  No costovertebral angle tenderness.  CHEST:  A well-healed sternotomy scar.  HEART:  PMI not displaced or sustained, S1 and S2 within normal limits.  No S3.  No S4.  No clicks, rubs, or murmurs.  ABDOMEN:  Obese, positive bowel sounds normal in frequency and pitch.  No bruits.  No rebound.  No guarding or midline pulsatile mass.  No  hepatomegaly or splenomegaly.  SKIN:  No rashes.  EXTREMITIES:  A 2+ pulses, right radial; absent left radial, 1+ dorsalis  pedis and posterior tibialis on the right; absent dorsalis pedis and  posterior tibialis on the left.  No cyanosis or clubbing.  No edema.  NEUROLOGIC:  Oriented to person, place, and time.  Cranial nerves II  through XII grossly intact.  Motor grossly intact.   EKG sinus rhythm, right bundle-branch block, leftward axis, no acute ST-  T wave changes.   ASSESSMENT/PLAN:  1. Coronary artery disease.  The patient is having no new symptoms.      No further cardiovascular testing is suggested as he had a stress      test was negative in 2007.  I might do this next year or sooner if      he has symptoms.  2. Obesity.  We had another long discussion about weight loss.  He      clearly does not diet like I would like him to.  He eats a lot of      rice.  We discussed some elements of the [employer].  3. Peripheral vascular  disease.  He is not getting any new      claudication though he gets occasional mild leg discomfort.  This      has not changed.  We will continue with risk reduction.  4. Diabetes per Dr. Lynelle Doctor.  5. Dyslipidemia.  He is going to fax me his latest results.  I will      defer him to his primary care physician.  The goal should be an LDL      less than 70 and HDL greater than 50.  6. Hypertension.  Blood pressure is well controlled and he is to      continue his meds as listed.  7. Followup.  I will see him back in 1 year or sooner if he has any      new symptoms.     Rollene Rotunda, MD, [name]  Electronically Signed    JH/MedQ  DD: 12/23/2007  DT: 12/24/2007  Job #: [id number]   cc:   Lavonda Jumbo, M.D.

## 2010-06-25 NOTE — Assessment & Plan Note (Signed)
[facility]                             PULMONARY OFFICE NOTE   NAME:Calkin, AHAD COLARUSSO                      MRN:          [id number]  DATE:08/21/2006                            DOB:          [birth year]    PULMONARY NEW PATIENT.   PROBLEM:  Sleep apnea.   HISTORY:  This 55 year old man had been followed years ago at [facility].  He had had a sleep study, making diagnosis of  obstructive sleep apnea and has been using CPAP from [facility] which was set  at 19 CWP in 2005.  He is not sleeping consistently at night, now  frequently waking and wants to be reassessed.  His current mask leaks  air.  Bedtime is between 10 p.m. at midnight, estimating 30 minutes  sleep latency and waking 3-4 times during the night before finally up at  5 a.m.  He does not think his weight has changed a lot.   MEDICATION:  1. Cozaar 100 mg.  2. Aspirin 325 mg.  3. Inspra 25 mg.  4. Vytorin 10/40.  5. Furosemide 20 mg.  6. Metoprolol 50 mg.  7. Insulin.  8. PRN use of nitroglycerin.   DRUG INTOLERANCE:  IBUPROFEN, WHICH UPSET STOMACH.   REVIEW OF SYSTEMS:  Snoring, daytime sleepiness, occasionally wakes  choking or gasping, nonrestorative sleep, occasional chest tightness for  which he asks a metered inhaler.  He had used one in the past.  Little  wheeze, no purulent sputum, no exertional chest pain, no edema.   PAST HISTORY:  1. Hypertension.  2. Myocardial infarction.  3. Coronary bypass graft (Cardiology, Dr. Antoine Poche).  4. Bronchitis but no other lung disease diagnosed.  5. Diabetes.  6. Elevated cholesterol.  7. Sleep apnea.  8. Blood clots but he does not know what kind.  9. He has had no ENT surgery.  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   SOCIAL HISTORY:  1. Never smoked.  2. Former heavy alcohol.  3. Not married.  4. 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   FAMILY HISTORY:  1. Sleep apnea.  2. Heart disease.  3. Cancer.   OBJECTIVE:  Weight 264 pounds, BP 122/70, pulse 76, room air saturation  98%.  He is alert and seems comfortable.  HEENT:  Missing teeth, palate spacing 4/4, nasal airway unobstructed.  No thyromegaly or stridor.  No neck vein distention.  CHEST:  Quiet lung sounds, no rales, rhonchi or wheeze.  No dullness.  HEART:  Regular without murmur or gallop.  ABDOMEN:  Obese.  EXTREMITIES:  No edema or cyanosis.   IMPRESSION:  1. Obstructive sleep apnea.  We need to establish best pressures and      look for his old sleep studies.  2. Bronchitis, mild but he requests an inhaler which was discussed.   PLAN:  1. Sample Ventolin HFA inhaler 2 puffs q.i.d. p.r.n.  2. CPAP titration, [facility] will provide a new mask.  3. Schedule return 1  month, earlier p.r.n.     Clinton D. Maple Hudson, MD, [facility], FACP  Electronically Signed    CDY/MedQ  DD: 08/21/2006  DT: 08/23/2006  Job #: [id number]   cc:   Dorisann Frames, M.D.

## 2010-06-25 NOTE — Assessment & Plan Note (Signed)
[facility]                            CARDIOLOGY OFFICE NOTE   NAME:Akey, BRELAND TROUTEN                      MRN:          [id number]  DATE:12/24/2006                            DOB:          10-13-1955    PRIMARY CARE PHYSICIAN:  Lavonda Jumbo, M.D.   REASON FOR PRESENTATION:  Evaluate patient with coronary disease and  peripheral vascular disease.   HISTORY OF PRESENT ILLNESS:  The patient is a 55 year old gentleman with  a history of coronary artery disease as described below.  Since last  being seen by Dr. Samule Ohm he has done relatively well.  He has had no  chest discomfort, neck or arm discomfort.  He has had no palpitations,  presyncope, or syncope.  He has had no PND or orthopnea.  He does not  walk or exercise as much as he has been told to.  He does get some  claudication in his left leg which seems to be stable.  He does not have  any resting leg pain.  He follows closely with Dr. Talmage Nap for his  diabetes and Dr. Lynelle Doctor for secondary risk reduction and other problems.  He unfortunately has not lost any weight and remains morbidly obese.   PAST MEDICAL HISTORY:  1. Coronary artery disease (status post CABG in 2005, with a LIMA to      the LAD, left radial to 2nd circumflex marginal, saphenous vein      graft to PDA, saphenous vein graft to a lateral sub-branch of a      ramus intermediate, and sequential saphenous vein graft to a medial      sub-branch of the ramus intermediate).  2. Sleep apnea (currently not on CPAP).  3. Diabetes mellitus.  4. Nephropathy.  5. Distal SFA occlusion (0.98 ABI on the right, 0.69 on the left).  6. Dyslipidemia.   ALLERGIES:  IBUPROFEN.   MEDICATIONS:  1. Cozaar 100 mg daily.  2. Aspirin 325 mg daily.  3. Insulin.  4. Inspra 25 mg daily.  5. Vytorin 10/80 daily.  6. Furosemide 40 mg daily.  7. Metoprolol 50 mg daily.   REVIEW OF SYSTEMS:  As stated in the HPI and otherwise negative for  other  systems.   PHYSICAL EXAMINATION:  GENERAL:  The patient is in no distress.  VITAL SIGNS:  Blood pressure 109/72, heart rate 70 and regular, weight  270 pounds, body mass index 38.  HEENT:  Eyelids unremarkable.  Pupils are equal, round, and reactive to  light.  Fundi not visualized.  Oral mucosa unremarkable.  NECK:  No jugular venous distention.  Wave form within normal limits.  Carotid upstroke brisk and symmetric.  No bruits.  No thyromegaly.  LYMPHATICS:  No cervical, axillary, or inguinal adenopathy.  LUNGS:  Clear to auscultation bilaterally.  BACK:  No costovertebral angle tenderness.  CHEST:  Well healed sternotomy scar.  HEART:  PMI not displaced or sustained.  S1 and S2 within normal limits.  No S3, no S4, no clicks, no rubs, no murmurs.  ABDOMEN:  Obese.  Positive bowel sounds, normal in frequency  and pitch.  No bruits.  No rebound.  No guarding.  No midline pulsatile mass.  No  hepatomegaly.  No splenomegaly.  SKIN:  No rashes.  No nodules.  EXTREMITIES:  Two plus right radial.  Absent left radial.  Two plus  dorsalis pedis and posterior tibialis on the right.  Absent dorsalis  pedis and posterior tibialis on the left.  No edema.  No cyanosis.  No  clubbing.  NEUROLOGIC:  Oriented to person, place and time.  Cranial nerves II-XII  grossly intact.  Motor grossly intact.   ASSESSMENT/PLAN:  1. Coronary disease.  The patient is doing well after coronary artery      bypass graft.  He had a stress perfusion study in 2007.  No further      cardiovascular testing is suggested at this point.  He should      participate more aggressively in risk reduction.  2. Obesity.  We discussed the need to loose weight with diet and      exercise.  I am not sure whether he will ever be compliant with      this.  3. Peripheral vascular disease.  I will get ABIs again this year to      make sure there has not been some critical change.  He has no      resting symptoms, however.  4. Sleep  apnea.  I have taken the liberty of getting him an      appointment followup with Dr. Maple Hudson.  He missed his last one.  He      needs to be titrated on a sleep mask.  5. Diabetes per Dr. Talmage Nap.  6. Dyslipidemia.  He recently had blood work drawn by Dr. Lynelle Doctor and I      would be happy to review this.  7. Hypertension.  Blood pressure is well controlled.  He will continue      medications as listed.   FOLLOWUP:  I will see him back in 1 year or sooner as needed.     Rollene Rotunda, MD, [facility]  Electronically Signed    JH/MedQ  DD: 12/24/2006  DT: 12/24/2006  Job #: [id number]   cc:   Lavonda Jumbo, M.D.

## 2010-06-28 NOTE — Assessment & Plan Note (Signed)
[facility]                              CARDIOLOGY OFFICE NOTE   NAME:[name], [name] GRANIER                      MRN:          [id number]  DATE:11/04/2005                            DOB:          July 17, 1955    This is a 55 year old African-American male patient with history of coronary  artery disease status post CABG in 2005.  He also has peripheral vascular  disease with claudication symptoms on his left lower extremity and known  distal left superficial femoral artery occlusion with reconstitution of a  popliteal artery and probable tibial artery as well.  Left ABI is 0.57 in  2005.   The patient presents today with week history of worsening pain on his left  side.  He says whenever he stands for a short period of time or tries to do  any type of work or exercise he has a pain that begins in his left lower  back and goes all the way down to his knee with aching.  He can sit down for  10 minutes and the pain eases.  He has no rest pain.  Also when he walks  from here out to the parking lot, he does have to stop because of severe  pain in his lower calf.   Patient also says he has had some chest tightness if he over eats or if he  talks, but necessarily when he exercises.  Another complaint is increase in  edema but he admits to eating packages of Ramen noodles as well as hot dogs  and bologna.  He knows he needs to cut back on his salt, but feels he has a  lot of fluid on board.   CURRENT MEDICATIONS:  1. Metoprolol 50 mg 1/2 b.i.d.  2. Furosemide 20 mg daily.  3. Actos 30 mg daily.  4. Zocor 40 mg daily.  5. Cozaar 100 mg daily.  6. Aspirin 325 daily.  7. Insulin.  8. Inspra 25 mg daily.  9. Vytorin 10/40 daily.   PHYSICAL EXAMINATION:  This is a pleasant 55 year old African-American male  in no acute distress.  Blood pressure 118/66.  Pulse 83. Weight 273.  NECK:  Is without JVD, HJR, bruit or thyroid enlargement.  LUNGS:  Decreased  breath sounds but they are clear anterior, posterior and  lateral.  HEART:  Regular rate and rhythm at 80 beats per minute.  Normal S1, S2.  Positive S4.  No murmur, rub, bruit, thrill or heave noted.  ABDOMEN:  Is obese. Normoactive bowel sounds are heard throughout.  EXTREMITIES:  +1 bilateral ankle and pretibial edema.  Poor distal pulses on  the left.  1+ on the right.   IMPRESSION:  1. Coronary artery disease status post coronary artery bypass graft in      January 2005 with the left internal mammary artery to the left anterior      descending, left radial artery to the circumflex marginal, saphenous      vein graft is at posterior descending artery, and saphenous vein graft      to the lateral sub-branch of  the ramus, and sequential saphenous vein      graft to the medial sub-branch of the ramus.  2. Good left ventricular function.  3. Peripheral vascular disease with worsening claudication symptoms and      known distal left superficial femoral artery occlusion.  4. Chest tightness.  Question ischemic.  5. Hyperlipidemia.  6. Diabetes mellitus.  Insulin dependent.  7. Hypertension.   EKG:  Normal sinus rhythm with right bundle branch block.   PLAN AT THIS TIME:  Because of his recent symptoms of chest tightness, we  will order an adenosine Cardiolite to rule out ischemia.  I have asked him  to cut back on his salt intake and told him he could take an extra Lasix at  this time for excessive fluid.  I will order a lower extremity arterial  Dopplers to follow up his claudication.  He also may have an orthopedic  problem as well as claudication symptoms that needs to be looked at further.  He will follow up with Dr. Samule Ohm after these tests are complete.                                  Jacolyn Reedy, PA-C                              Veverly Fells. Excell Seltzer, MD   ML/MedQ  DD:  11/04/2005  DT:  11/06/2005  Job #:  [id number]

## 2010-06-28 NOTE — Consult Note (Signed)
NAME:  [name] [name], [name] [name]                         ACCOUNT NO.:  [id number]   MEDICAL RECORD NO.:  [id number]                   PATIENT TYPE:  INP   LOCATION:  2302                                 FACILITY:  [facility]   PHYSICIAN:  Salvatore Decent. Cornelius Moras, M.D.              DATE OF BIRTH:  31-May-1955   DATE OF CONSULTATION:  03/01/2003  DATE OF DISCHARGE:                                   CONSULTATION   REFERRING PHYSICIAN:  Carole Binning, M.D. [facility]   PRIMARY CARE PHYSICIAN:  Lonia Blood, M.D. at the [facility].   REASON FOR CONSULTATION:  Severe three vessel coronary artery disease with  class II stable angina.   HISTORY OF PRESENT ILLNESS:  The patient is a 55 year old African-American  male with no previous history of coronary artery disease, but risk factors  including type 2 diabetes mellitus and a longstanding history of tobacco  abuse.  The patient was referred by Dr. Mikeal Hawthorne to Dr. [name] for  evaluation of exertional chest pain.  The patient describes a one-year  history of symptoms of substernal chest tightness that is typically brought  on with physical exertion or eating meals.  His pain is usually relieved by  rest.  He has mild exertional shortness of breath.  He denies any episodes  of chest pain occurring at rest or with minimal activity and he denies any  history of nocturnal chest pain, orthopnea, paroxysmal nocturnal dyspnea,  palpitations, or syncope. He has had mild intermittent lower extremity  edema.  He was initially evaluated by Dr. [name] on January 14, and he  subsequently underwent an exercise treadmill test that day.  This was found  to be positive with significant ST segment depression and T wave inversion  in the inferior and anterolateral leads that occurred during the recovery  phase.  He was subsequently scheduled for elective cardiac catheterization.  This was performed today by Dr. [name] and notable for the findings  of  severe three vessel coronary artery disease and preserved left ventricular  function.  Cardiac surgical consultation has been requested.   REVIEW OF SYSTEMS:  GENERAL:  The patient reports feeling well with normal  appetite.  He has not been gaining or losing weight recently.  CARDIAC:  Notable for symptoms of exertional substernal chest tightness as described.  The patient has exertional shortness of breath.  The patient denies resting  shortness of breath, resting chest pain, syncope, palpitations.  RESPIRATORY:  Notable for recent upper respiratory tract infection and  history of bronchitis on a chronic basis in the past.  The patient denies  any productive cough, hemoptysis, or wheezing.  The patient has recently  been diagnosed with obstructive sleep apnea.  GASTROINTESTINAL:  Negative  with exception of history of postprandial epigastric substernal chest  tightness that sounds more suggestive of angina, but at times seems to be  relieved by antacids.  The patient denies a history of hematochezia,  hematemesis, or melena.  The patient has a history of pancreatitis and  alcoholic cirrhosis in the distant past, but has not been active recently.  NEUROLOGY:  Negative.  The patient does have mild numbness in both feet that  is chronic.  The patient denies history suggestive of recent TIA or previous  stroke.  MUSCULOSKELETAL:  Notable for some history of gout and  osteoarthritis.  The patient also apparently had some nerve damage involving  his right arm for which he is disabled following an injury several years  ago.  The patient has occasional problems with his right ankle swelling and  hurting.  PERIPHEROVASCULAR:  The patient describes pain in his left calf  brought on by ambulation and relieved by rest.  This has gone on for more  than two years and occurs with less than one block of ambulation.  GENITOURINARY:  Negative with exception of some nocturia.  The patient  denies  urinary frequency or urgency.  INFECTIOUS:  Notable for recent flare-  up of acute bronchitis which has now improved.  The patient denies fevers or  chills.  HEMATOLOGIC:  Negative.  HEENT:  Notable for poor dentition.  The  patient states he may have an abscess on the upper side.  There is no pain  or loose teeth at present by his report.  He wears corrective glasses for  eyesight for driving.   PAST MEDICAL HISTORY:  1. Type 2 diabetes mellitus.  2. Longstanding tobacco abuse.  3. 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  4. Pancreatitis in the distant past.  5. Obstructive sleep apnea.  6. 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  7. Degenerative arthritis and osteoarthritis.  8. Gout.   PAST SURGICAL HISTORY:  1. Right ulnar nerve reconstruction.  2. Left knee arthroscopy x2.  3. Jaw surgery secondary to abscess distant past.   FAMILY HISTORY:  Notable in that the patient's father had coronary artery  disease and underwent coronary artery bypass grafting.   SOCIAL HISTORY:  The patient is disabled secondary to his work-related  injury.  He lives with his brother and girlfriend.  He is currently on  Medicaid.  He is single.  He has a longstanding history of heavy tobacco  abuse, smoking between 1/2 and 1 pack of cigarettes a day for many years.  The patient has remote history of heavy alcohol abuse, although, he quit  drinking in 1987.   MEDICATIONS:  1. Glucovance 2.5/500 one tablet twice daily.  2. Allegra 60 mg twice daily as needed.  3. Albuterol metered dose inhaler as needed.  4. Tussionex as needed.   As of January 14, the patient has been taking aspirin 325 mg daily, Toprol  XL 25 mg daily.  The patient uses Zantac over-the-counter as needed for  indigestion.   ALLERGIES:  No known drug allergies. The patient reports GI upset with  ibuprofen.  PHYSICAL EXAMINATION:  GENERAL:  A well-appearing, mildly obese, African-  American male who  appears his stated age and in no acute distress.  VITAL SIGNS:  Blood pressure 125/83, pulse 60 and regular.  He is afebrile.  HEENT:  Essentially unrevealing, although, he does have poor dentition.  NECK:  Supple.  There is no cervical or supraclavicular lymphadenopathy.  There is no jugular venous distention.  There is no carotid bruits.  CHEST:  Clear and symmetrical breath sounds bilaterally.  No wheezes or  rhonchi noted.  HEART:  Regular rate and rhythm.  No murmurs, rubs, or gallops noted.  ABDOMEN:  Mildly obese, but soft and nontender.  Bowel sounds are present.  There are no palpable masses.  The liver edge is not enlarged.  EXTREMITIES:  Warm and well-perfused.  There is no lower extremity edema.  Distal pulses are diminished, particularly on the left lower leg in  comparison with the right.  There is a palpable right posterior tibial  pulse, but no palpable pulse on the left.  There is no sign of venous  insufficiency.  There are mild changes of diabetes with hemosiderosis and  some shininess of the skin, but there are no open skin lesions or  ulcerations.  Examination of the left hand is notable for palpable radial  and ulnar pulse with an intact palmar arch circulation based upon Alan test.  RECTAL:  GENITOURINARY:  Both deferred.  NEUROLOGY:  Grossly nonfocal and symmetrical throughout.   DIAGNOSTIC TESTS:  Cardiac catheterization performed today by Dr. [name]  has been reviewed.  This demonstrates severe three vessel coronary artery  disease with preserved left ventricular function.  Specifically, there is  40% distal stenosis of the left main coronary artery.  There is 60 to 70%  stenosis of the proximal left anterior descending coronary artery beginning  just after the bifurcation of the left main coronary artery.  There is  subtotal occlusion of a large ramus intermediate branch which bifurcates.  There is 70% stenosis of the left circumflex coronary artery before a  large  second circumflex marginal branch.  There is 100% occlusion of the mid right  coronary artery with left to right collateral filling of the distal right  coronary circulation.  Overall ejection fraction is estimated at 55 to 60%  with mild inferior wall hypokinesis.   LABORATORY DATA:  Blood work obtained January 14, includes hemoglobin 15.8,  hematocrit 45.7%, white blood cell count 7500, platelet count 214,000.  Coagulation profile was normal. Serum electrolytes were normal with a  baseline serum creatinine of 1.2.   12-lead electrocardiogram demonstrates normal sinus rhythm with first degree  AV block.  Hemoglobin A1C obtained in December of 2004 was severely elevated  at 9.6.   IMPRESSION:  Severe three vessel coronary artery disease with class II  exertional angina and preserved left ventricular function.  The patient has type 2 diabetes mellitus which has not been well controlled.  He has  longstanding and ongoing tobacco abuse.  He has remote history of alcohol  abuse with alcoholic cirrhosis and a history of pancreatitis in the past.  He has obstructive sleep apnea and likely peripheral vascular disease with  left lower extremity claudication.  I believe coronary artery disease would  best be treated with surgical revascularization.   PLAN:  I have outlined options at length with the patient and his family.  Alternative treatment strategies have been discussed.  He understands and  accepts all associated risks of surgery including, but not limited to risk  of death, stroke, myocardial infarction, congestive heart failure,  respiratory failure, pneumonia, bleeding requiring blood transfusions,  arrhythmia, infection, and recurrent coronary artery disease.  All of his  questions have been addressed.  We tentatively plan to proceed with surgery  first thing tomorrow.  Salvatore Decent. Cornelius Moras, M.D.    CHO/MEDQ  D:  03/01/2003  T:   03/02/2003  Job:  [id number]   cc:   Carole Binning, M.D. [facility]   Lonia Blood, M.D.  [address] Resident  [place], [place] [address]  Fax: [phone]

## 2010-06-28 NOTE — Assessment & Plan Note (Signed)
[facility]                            CARDIOLOGY OFFICE NOTE   NAME:Mizner, KENNIS WISSMANN                      MRN:          [id number]  DATE:01/07/2006                            DOB:          05-17-1955    PRIMARY CARDIOLOGIST:  Dr. Randa Evens.   PRIMARY CARE PHYSICIAN:  Dr. Dorisann Frames at [facility], [address], [place].  Fax  number [phone].   PATIENT PROFILE:  A 55 year old, African-American male with a prior  history of CAD and peripheral vascular disease who presents for office  followup.   PROBLEMS:  1. Coronary artery disease.      a.     March 02, 2003, status post coronary artery bypass graft       x5 with left internal mammary artery to the left anterior       descending, left radial artery to the obtuse marginal 2, vein       graft to the posterior descending artery, and sequential vein       graft to the lateral and medial sub branches of the ramus       intermedius performed by Dr. Cornelius Moras.      b.     November 20, 2005, adenosine Myoview revealing no scar or       ischemia with septal motion consistent with prior coronary artery       bypass graft, ejection fraction 63%.  2. Hypertension.  3. Hyperlipidemia.  4. Peripheral vascular disease.      a.     May 2005, ankle-brachial index suggestive of distal left       superficial femoral artery occlusion with reconstitution with the       popliteal artery and probable tibial disease.      b.     November 17, 2005, repeat ankle-brachial indices revealing an       ankle-brachial index on the right of 0.98, ankle-brachial index on       the left of 0.69.  Findings felt to be stable.  5. Type 2 diabetes mellitus.  6. Obesity.  7. Normal left ventricular function.   HISTORY OF PRESENT ILLNESS:  A 55 year old, African-American male who  was last seen in clinic in September 2007.  At that time, he was  complaining of left leg  discomfort which was primarily arthritic and  knee pain for which he sees Dr. Wyline Mood.  He did note, though, that he  had some left calf discomfort if he over exerted himself.  He was also  having some atypical chest tightness and subsequently underwent ABIs  which were stable as well as an adenosine Myoview which showed no  ischemia with nl LV function as outlined in the problem list. Since his  last visit, he has done relatively well.  He is on Disability and does  not exercise very often but denies any chest pain or shortness of  breath.  For the most part, he is able to do the things that he would  like to do.  He does experience  left calf pain approximately twice a  week if he over exerts himself like when walking long distances.  This  tightness lasts about 3 minutes and resolves with rest or decrease in  pace.  His biggest complaint is left knee pain which he believes is  arthritic in nature and has followup with Dr. Wyline Mood scheduled.   Home medications:  1. Metoprolol 50 mg 1/2 tablet b.i.d.  2. Cozaar 100 mg daily.  3. Aspirin 325 mg daily.  4. Humalog 20 units in the a.m. and 40 units in the p.m.  5. Inspra 25 mg daily.  6. Vytorin 10/40 mg daily.  7. Lasix 40 mg daily.   PHYSICAL EXAM:  Blood pressure 100/60, heart rate 80, respirations 16.  He is afebrile.  His weight is 267.12 pounds, down from 273 pounds in  September.  A pleasant, African-American male in no acute distress, awake, alert,  and oriented x3.  NECK:  No bruits or JVD.  LUNGS:  Respirations regular and unlabored, clear to auscultation.  CARDIAC:  Regular S1, S2.  No S3, S4, murmurs.  ABDOMEN:  Obese, soft, nontender, nondistended.  Bowel sounds present  x4.  EXTREMITIES:  Warm, dry without clubbing, cyanosis, and trace lower  extremity edema.  Dorsalis pedis pulses are 2+ bilaterally.   ACCESSORY CLINICAL FINDINGS:  Please see problem list for ABI and  Myoview results.   ASSESSMENT AND PLAN:  1.  Coronary artery disease.  Mr. Coufal continues to do reasonably      well without any chest pain, shortness of breath.  He recently had      a negative Myoview in October.  I will continue his beta blocker,      angiotensin receptor blocker, aspirin, statin.  2. Hypertension, well controlled.  Continue beta blocker and      angiotensin receptor blocker.  3. Hyperlipidemia.  Continue statin therapy.  The patient is      tolerating this well.  4. Type 2 diabetes mellitus followed by his primary care physician.      He remains on insulin therapy as prescribed.  5. Peripheral vascular disease.  He has recently underwent ankle-      brachial indices which are stable.  He has had stable symptoms over      several years.  He is encouraged to walk more and, otherwise,      continue aspirin and statin therapy.   DISPOSITION:  The patient will followup with Dr. Samule Ohm in approximately  3 months or sooner if necessary.      Nicolasa Ducking, ANP  Electronically Signed      Salvadore Farber, MD  Electronically Signed   CB/MedQ  DD: 01/07/2006  DT: 01/08/2006  Job #: [id number]

## 2010-06-28 NOTE — Op Note (Signed)
NAME:  [name], [name]                         ACCOUNT NO.:  [id number]   MEDICAL RECORD NO.:  [id number]                   PATIENT TYPE:  INP   LOCATION:  2302                                 FACILITY:  [facility]   PHYSICIAN:  Salvatore Decent. Cornelius Moras, M.D.              DATE OF BIRTH:  October 16, 1955   DATE OF PROCEDURE:  03/02/2003  DATE OF DISCHARGE:                                 OPERATIVE REPORT   PREOPERATIVE DIAGNOSIS:  Severe three vessel coronary artery disease with  class 2 progressive angina.   POSTOPERATIVE DIAGNOSIS:  Severe three vessel coronary artery disease with  class 2 progressive angina.   PROCEDURE:  Median sternotomy for coronary artery bypass grafting x 5 (left  right internal mammary artery to distal left anterior descending  coronary  artery, left radial artery to second circumflex marginal branch, saphenous  vein graft to posterior descending coronary artery, saphenous vein graft to  lateral sub branch of the ramus intermedius branch, and sequential saphenous  vein graft to medial sub branch of the ramus intermedius branch, endoscopic  saphenous vein harvest from right thigh).   SURGEON:  Salvatore Decent. Cornelius Moras, M.D.   ASSISTANT:  Gwenith Daily. Tyrone Sage, M.D.   SECOND ASSISTANT:  Rowe Clack, P.A.-C.   THIRD ASSISTANT:  Jerold Coombe, P.A.-C.   ANESTHESIA:  General.   BRIEF CLINICAL NOTE:  The patient is a 55 year old African American male  with a history of type 2 diabetes mellitus and hyperlipidemia.  He presents  with symptoms of exertional angina.  He underwent an exercise treadmill  stress test by Dr. [name] and this was abnormal.  He underwent  elective cardiac catheterization on January 19 by Dr. [name].  This  demonstrates severe three vessel coronary artery disease with preserved left  ventricular  function.  A full consultation note has been dictated  previously.  The patient and his family have been counseled at risk  regarding the indications,  risks, and potential benefits of coronary artery  bypass grafting.  Alternative treatment strategies have been discussed.  He  understands and accepts all associated risks of surgery and desires to  proceed as described.   OPERATIVE NOTE IN DETAIL:  The patient was brought to the operating room on  the above mentioned date and central monitoring is established by the  anesthesia service under the care and direction of Dr. [name].  Specifically, a Swan-Ganz catheter was placed through the right internal  jugular approach.  A right radial arterial line is placed.  Intravenous  antibiotics are administered. With the patient lying supine on the operating  table, the patient's left hand carefully examined.  A pulse oximetry probe  was placed on his left index finger.  The pulse oximetry wave form is  monitored while the left radial pulse is briefly occluded.  There remains a  normal pulse oximetry wave form despite occlusion of the radial pulse  consistent with an intact palmar arch circulation.  General endotracheal  anesthesia was induced uneventfully.  A Foley catheter was placed.  The  patient's chest, left upper extremity, abdomen, both groins, and both lower  extremities are prepped and draped in a sterile manner.   The left radial artery is harvested to be utilized as a conduit for bypass  grafting through a longitudinal incision along the volar aspect of the  forearm.  Care was taken during the dissection to avoid injury to the  superficial thenar branch or the left radial nerve.  The Harmonic scalpel  was utilized to divide all intervening side branches of the artery and its  associated veins.  The artery was mobilized from just below the first  osseous perforating branch below the antecubital fossa all the way to just  above the wrist.  The distal end of the artery was transected and the distal  stump doubly oversewn with suture ligatures.  The artery was then flushed  with  heparinized solution and the proximal end of the artery transected and  the proximal stump doubly oversewn with suture ligatures.  The radial artery  conduit is removed and placed in a small container with heparinized  patient's blood and a small amount of papaverine solution for storage.  The  forearm incision is irrigated with saline solution and inspected for  hemostasis.  The forearm incision is then closed using standard two layer  closure with running absorbable suture.   Simultaneously, saphenous vein is obtained from the patient's right thigh  using endoscopic vein harvest technique.  The saphenous vein is felt to be a  good quality conduit as is the left radial artery conduit.   A median sternotomy incision was performed and the left internal mammary  artery dissected from the chest wall and prepared for bypass grafting.  The  left internal mammary artery is somewhat small caliber but good quality and  has excellent forward flow.  The patient is heparinized systemically.  The  pericardium was opened.  The ascending aorta is normal in appearance.  The  ascending aorta and right atrium are cannulated for cardiopulmonary bypass.  Adequate heparinization is verified.  Cardiopulmonary bypass was begun and  the surface of the heart is inspected.  The heart is essentially normal with  very mild left ventricular  hypertrophy.  Distal sites are selected for  coronary bypass grafting.  Portions of saphenus vein, the left radial  artery, and left internal mammary artery are all prepared for bypass  grafting.  A cardioplegia catheter was placed in the ascending aorta.  A  temperature probe was placed in the left ventricular septum.   The patient was cooled to 32 degrees systemic temperature.  The aortic Cross  clamp was applied and cardioplegia delivered in an antegrade fashion through  the aortic root.  Ice saline flush is applied for topical hypothermia.  The initial cardioplegia arrest  and myocardial cooling is felt to be excellent.  Repeat doses of cardioplegia are administered intermittently throughout the  crossclamp portion of the operation, both through the aortic root and down  the subsequently placed vein grafts to maintain septal temperature below 15  degrees Centigrade.   The following distal coronary anastomoses are performed:   1. The posterior descending artery was grafted with a saphenous vein graft     in an end-to-side fashion.  This coronary measures 1.5 mm in diameter and     is of fair quality at the site of distal  bypass.  2. The second circumflex marginal branch is grafted with the left radial     artery in an end-to-side fashion.  This coronary measures 1.5 mm in     diameter and is of good quality at the site of distal bypass.  3. The lateral sub branch of the ramus intermediate graft is grafted with a     saphenous vein graft in a side-to-side fashion.  The ramus intermediate     branch is a large vessel which bifurcates proximally.  The lateral sub     branch is sub totally occluded and this is the anastomosis performed at     this juncture.  The vessel measures 1.5 mm in diameter at the site of     distal bypass, is intramyocardial, but is otherwise fair to good quality     target.  4. The medial sub branch of the ramus intermediate graft is grafted using a     sequential saphenous vein graft off the vein placed to the lateral sub     branch.  This coronary measures 1.5 mm in diameter and is of good quality     at the site of distal bypass.  5. The distal left anterior descending coronary artery is grafted with the     left internal mammary artery in an end-to-side fashion. This coronary     measures 2 mm in diameter and is of good quality.   All three proximal anastomoses are performed directly to the ascending aorta  prior to removal of the aortic Cross clamp.  The proximal end of the left  radial artery is sewn directly to the aorta as is  both saphenous vein  grafts.  All air is evacuated from the aortic root.  The septal temperature  is noted to rise rapidly upon reperfusion of the left internal mammary  artery.  The aortic Cross clamp is then removed after a total Cross clamp  time of 85 minutes.  The heart is defibrillated and sinus rhythm  subsequently resumes spontaneously.  All proximal and distal anastomoses are  inspected for hemostasis and appropriate graft orientation. Epicardial  pacing wires are affixed to the right ventricular outflow tract and to the  right atrial appendage.  The patient is rewarmed to greater than 37 degrees  Centigrade temperature.  The patient is weaned from cardiopulmonary bypass  without difficulty.  The patient's rhythm at separation from bypass is  normal sinus rhythm.  No inotropic support is required.  Total  cardiopulmonary bypass time for the operation is 105 minutes.   The venous and arterial cannulae are removed uneventfully.  Protamine is administered to reverse the anticoagulation.  The mediastinum and the left  chest are irrigated with saline solution containing vancomycin.  Meticulous  surgical hemostasis is ascertained.  The mediastinum and the left chest are  drained with three chest tubes placed through separate stab incisions  inferiorly.  The median sternotomy incision is closed in routine fashion  using double strand sternal wire for the sternal closure.  The right lower  extremity incision is closed in multiple layers in routine fashion. All skin  incisions are closed with subcuticular skin closure.  The patient tolerated  the procedure well and is transported to the surgical intensive care unit in  stable condition.  There are no interoperative complications.  All sponge,  instrument, and needle counts were verified as correct at the completion of  the operation.  No blood products were administered.  Salvatore Decent. Cornelius Moras,  M.D.    CHO/MEDQ  D:  03/02/2003  T:  03/02/2003  Job:  [id number]   cc:   Carole Binning, M.D. [facility]

## 2010-06-28 NOTE — Assessment & Plan Note (Signed)
[facility]                            CARDIOLOGY OFFICE NOTE   NAME:[name] [name], [name] [name]                      MRN:          [id number]  DATE:04/13/2006                            DOB:          03-Nov-1955    PRIMARY CARE PHYSICIAN:  Dorisann Frames, M.D., [facility].   HISTORY OF PRESENT ILLNESS:  [name] [name] is a 55 year old gentleman with  coronary disease and peripheral arterial disease.  He is status post  coronary artery bypass grafting in January 2005.  Recent exercise test  demonstrated no evidence of ischemia or scar and an ejection fraction of  63%.   He also has lower extremity peripheral arterial disease with occlusion  of the left superficial femoral artery and an ABI of 0.69 on that side.  No significant disease on the right.   [name] [name] says he is doing well.  Unfortunately, he is not getting any  exercise.  He does say he is not having any claudication, chest  discomfort, PND, orthopnea, edema, palpitations, or syncope.  He tells  me today for the first time that he has had discomfort in his left  forearm and hand ever since his bypass surgery in which the left radial  artery was used.  He says he has throbbing pain when the hand is raised,  but it is better when it is low.  He has never had any tissue loss on  that hand.   CURRENT MEDICATIONS:  1. Cozaar 100 mg daily.  2. Aspirin 325 mg daily.  3. Insulin.  4. Inspra 25 mg daily.  5. Vytorin 10/40 one daily.  6. Lasix 40 mg twice daily.  7. Metoprolol succinate 50 mg daily.   PHYSICAL EXAMINATION:  GENERAL:  He is generally well appearing, in no  distress.  VITAL SIGNS: Heart rate 67, blood pressure 100/70.  Weight 271 pounds.  NECK:  He has no jugular venous distention, thyromegaly, or  lymphadenopathy.  LUNGS:  Respiratory effort is normal.  Lungs are clear to auscultation.  CARDIOVASCULAR:  He has a nondisplaced point of maximal cardiac impulse.  He  has a regular rate and rhythm without murmur, rub, or gallop.  ABDOMEN: Soft, nondistended, nontender.  There is no hepatosplenomegaly.  Bowel sounds are normal.  There is no midline pulsatile mass.  EXTREMITIES:  The left radial pulse is absent.  The left ulnar pulse is  2+.  I checked pulse oximetry on all five fingers of the left hand.  Pulse oximetry signal is excellent and equal in all five fingers,  implying normal perfusion and a patent palmar arch.   IMPRESSION AND RECOMMENDATIONS:  1. Coronary disease: Doing nicely after CABG.  Continue current      medical therapy including aspirin, beta blocker, ARB.  2. Hypertension: Very nicely controlled.  Continue current      medications.  3. Type 2 diabetes mellitus:  Followed by Dr. Talmage Nap.  4. Peripheral arterial disease:  Asymptomatic.  Continue conservative      management.  Encouraged exercise on a daily basis.  5. Hypercholesterolemia:  Managed by  Dr. Talmage Nap.  Goal LDL less than      70.  6. Left arm pain:  This does not appear to be ischemic despite the      radial artery harvest.  I asked him to discuss this with his      orthopedist.     Salvadore Farber, MD  Electronically Signed    WED/MedQ  DD: 04/13/2006  DT: 04/13/2006  Job #: [id number]

## 2010-06-28 NOTE — Cardiovascular Report (Signed)
NAME:  [name] [name], [name] [name]                         ACCOUNT NO.:  [id number]   MEDICAL RECORD NO.:  [id number]                   PATIENT TYPE:  INP   LOCATION:  2399                                 FACILITY:  [facility]   PHYSICIAN:  Carole Binning, M.D. [facility]         DATE OF BIRTH:  16-Dec-1955   DATE OF PROCEDURE:  03/01/2003  DATE OF DISCHARGE:                              CARDIAC CATHETERIZATION   PROCEDURE PERFORMED:  Left heart catheterization with coronary angiography  and left ventriculography.   INDICATIONS:  Mr. Mcelreath is a 55 year old male with diabetes mellitus.  He  has been having exertional angina.  An exercise treadmill test was markedly  positive with 2-3 mm of downsloping ST segment depression in the inferior  and anterolateral leads occurring during the recovery phase of exercise.  We, therefore, opted to proceed with cardiac catheterization.   PROCEDURAL NOTE:  A 6-French sheath was placed in the right femoral artery.  Coronary angiography was performed with 6-French JL4 and JR4 catheters.  Left ventriculography was performed with an angled pigtail catheter.   CONTRAST:  Omnipaque.   COMPLICATIONS:  There were no complications.   RESULTS:   HEMODYNAMICS:  1. Left ventricular pressure 108/16.  2. Aortic pressure 108/74.  3. There is no aortic valve gradient.   LEFT VENTRICULOGRAM:  There is mild hypokinesis of the inferior wall.  Ejection fraction is calculated at 58%.  There is no mitral regurgitation.   CORONARY ANGIOGRAPHY (RIGHT DOMINANT):  1. Left main has a tubular 40-50% stenosis in the mid to distal vessel.  2. Left anterior descending artery has a 60% at its ostium.  The LAD gives     rise to a small first diagonal branch.  3. Left circumflex has a diffuse 75% stenosis in the mid vessel.  The     circumflex gives rise to a large branch in ramus intermedius.  In the     proximal portion of the ramus intermedius there is a long 90% stenosis     associated  with what appears to be a spontaneous dissection.  There is     TIMI-2 flow in the vessel beyond this.  The circumflex also gives rise to     a normal-sized first obtuse marginal branch and a large second obtuse     marginal branch.  4. Right coronary artery is a dominant vessel.  There is a diffuse 70%     stenosis in the proximal vessel followed by 100% occlusion of the mid     vessel.  The distal right coronary artery consisting of a normal-sized     posterior descending artery and 3 small posterolateral branches fills via     left-to-right collaterals.   IMPRESSION:  1. Left ventricular systolic function in the low range of normal with mild     hypokinesis of the inferior wall.  2. 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   PLAN:  The patient will be referred for coronary artery bypass surgery.                                               Carole Binning, M.D. [facility]    MWP/MEDQ  D:  03/01/2003  T:  03/02/2003  Job:  [id number]   cc:   Lonia Blood, M.D.  [address] Resident  [place], [place] [address]  Fax: [phone]   Cardiac Cath Lab

## 2010-06-28 NOTE — Discharge Summary (Signed)
NAME:  [name] [name], [name] [name]                         ACCOUNT NO.:  [id number]   MEDICAL RECORD NO.:  [id number]                   PATIENT TYPE:  INP   LOCATION:  2023                                 FACILITY:  [facility]   PHYSICIAN:  Salvatore Decent. Cornelius Moras, M.D.              DATE OF BIRTH:  1955/05/27   DATE OF ADMISSION:  03/01/2003  DATE OF DISCHARGE:  03/14/2003                                 DISCHARGE SUMMARY   ADMISSION DIAGNOSIS:  Coronary artery disease.   PAST MEDICAL HISTORY:  1. Diabetes mellitus type 2.  2. 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  3. Pancreatitis in the distant past.  4. Obstructive sleep apnea.  5. Degenerative arthritis and osteoarthritis.  6. Gout.  7. Longstanding tobacco abuse.  8. 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   PAST SURGICAL HISTORY:  1. Left knee arthroscopy x2.  2. Jaw surgery secondary to abscess in the distant past.   ALLERGIES:  No known drug allergies. He does have GI upset with IBUPROFEN.   DISCHARGE DIAGNOSES:  1. Severe three vessel coronary artery disease with class II stable angina,     status post coronary artery bypass grafting.  2. Postoperative colonic ileus, resolving.   HISTORY OF PRESENT ILLNESS:  [name] [name] is a 55 year old African American  male.  He is followed by Dr. Mikeal Hawthorne at the [facility] and  he reported to her a one year history of symptoms of substernal chest  tightness brought on by activity or eating.  Because of these symptoms, Dr.  Mikeal Hawthorne referred [name] [name] to Dr. [name].  He was evaluated by Dr.  [name] on February 25, 2003, this evaluation included an exercise  treadmill test.  The test was found to be positive with significant ST  segment depression and T-wave inversion during the recovery phase.  Dr.  [name] recommended proceeding with cardiac catheterization and [name] [name]  agreed with this plan.   HOSPITAL COURSE:  March 01, 2003, [name] [name] was electively admitted to  [facility] in the care of Dr. Loraine Leriche Pulsipher.  He  underwent cardiac catheterization which revealed severe three vessel  coronary artery disease with preserved left ventricular function.  Ejection  fraction was estimated to be 58%.  Because his lesions were not amenable to  PCI, cardiac surgery consultation was requested.  He was evaluated later in  the day by Dr. Tressie Stalker.  After examination of the patient and review  of the available records, Dr. Cornelius Moras agreed that preceding with coronary  artery bypass grafting was the preferred treatment choice for this  gentleman.  The procedure risks and benefits were all discussed with [name] and he elected to proceed with surgery.  He was scheduled for March 02, 2003.   Preoperative arterial evaluation included a carotid Duplex examination which  revealed no significant carotid artery stenosis.  His upper extremity  examination included Allen's tests, these were normal bilaterally.  Lower  extremity examination included ankle brachial indices, results on the right  greater than 1.0, on the left 0.57.   On March 02, 2003, [name] [name] underwent the following surgical procedure  with Dr. Tressie Stalker.  Coronary artery bypass grafting x5.  Grafts placed  at the time of the procedure were left internal mammary artery graft to left  anterior descending coronary artery, left radial artery was grafted to the  second obtuse marginal artery, saphenous vein was grafted to posterior  descending coronary artery, saphenous vein was grafted in sequential fashion  to the first intermediate and second intermediate artery.  Vein was  harvested from the right thigh via the endovein harvesting technique through  the mid calf via an open surgical technique.  [name] [name] tolerated this  procedure well, transferring in stable condition to the SICU.  He remained   hemodynamically stable in the immediate postoperative period and was  extubated several hours after arrival in the intensive care unit.  [name] required only routine care in the intensive care unit and was  transferred to unit 2000 on the afternoon of postoperative day #1.   Of note, his admitting hemoglobin A1C was elevated at 9.6.  He was seen in  consultation by the diabetes team.  He will be followed as an outpatient by  the nutrition diabetes management center.   [name] [name] had a transient rise in his white blood cells after surgery on  postoperative day #2.  This was felt most likely to be due to some  atelectasis.  His white blood cells were down to 9.6 by postoperative day  #5.  He also required oxygen supplementation for several days after surgery.  In addition to his atelectasis, he also had some volume overload.  He was  treated with Lasix as well as encouraged aggressive pulmonary toilet with  incentive spirometer and the flutter valve.  His supplemental oxygen was  slowly weaned and he was able to be removed from the oxygen on postoperative  day #7.  His oxygen saturations have remained 94 to 98% on room air.  [name] postoperative course was slowed by a colonic ileus.   Postoperative day #5, his abdomen was noted to be quite distended.  He had  few bowel sounds.  He was mildly tender.  He also had some diarrhea.  He was  treated conservatively.  An abdominal series revealed a mild colonic ileus.  He continued to have hypoactive bowel sounds and abdominal distention,  however, he also continued to pass flatus and some loose bowel movements.   By postoperative day #8, since his ileus was not improving, GI consultation  was requested.  He was evaluated by Dr. Russella Dar.  His recommendation was to  add Reglan and Zelnorm for bowel function.  Over the next several days, he  clinically made improvements.  His abdominal distention decreased.  His bowel sounds increased.   He continued to have some loose stools and  continued to pass flatus.   March 12, 2003, GI recommendation was to continue Zelnorm and Reglan for  two more days and then discontinue.  [name] [name] will be followed up in Dr.  Ardell Isaacs office in March.  [name] [name] has otherwise been making very good  progress and recovering from his surgery.  He has been working with the  cardiac rehab staff since postoperative  day #2 and is ambulating  independently now.   This morning, March 14, 2003, postoperative day #11, he reports feeling  very well.  His vital signs are stable.  Blood pressure 106/60, he is  afebrile.  His room air saturations 99%.  He has had fair glycemic control  since his surgery.  On March 13, 2003, glucose levels ranged 71 to 173.  He will require close follow-up of his diabetes after discharge from the  hospital.  His weight is 216.8 pounds.  This is well below his 229 pound  preoperative weight.  His heart is maintaining normal sinus rhythm,  approximately 67 beats per minute, his lungs are clear to auscultation  bilaterally.  He is tolerating his diet well.  On examination this morning,  his abdomen is softer with active bowel sounds.  It is nontender.  All of  his incisions continue to heal well.  His left hand is neurovascularly  intact.  He does have mild right lower extremity edema.  He is ambulating  independently in the hallway.  His pain is well controlled.  [name] [name] is  stable from cardiac surgery standpoint.  GI service also feels he is ready  for discharge and will be followed up as an outpatient.  [name] [name] is  discharged home this afternoon, March 13, 2002.   LABORATORY DATA:  March 10, 2003, CBC with white blood cells 10.7,  hemoglobin 12.5, hematocrit 36.1, platelets 336.  Chemistries included  sodium 132, potassium 4.3, chloride 99, CO2 25, BUN 9, creatinine 1.1,  glucose 92.  Admission hemoglobin A1C 9.8.   CONDITION ON DISCHARGE:  Improved.    DISCHARGE INSTRUCTIONS:  1. Medications:     a. Lopressor 25 mg b.i.d.     b. Glucotrol XL 5 mg daily.     c. Actos 30 mg daily.     d. Zocor 40 mg daily.     e. Aspirin 325 mg daily.     f. Colace 200 mg daily.  2. Pain management:  He may have Tylenol 325 mg one to two p.o. q.4h. for     mild pain.  3. Activity:  He has been asked to refrain from any driving or heavy     lifting, pushing or pulling.  Also instructed to continue his breathing     exercises and daily walking.  4. His diet should continue to be a heart healthy diabetic diet.  5. Wound care:  He is to shower daily with mild soap and water.  If his     incision shows any sign of infection or if he has a fever greater than     101 degrees F., he is to call Dr. Orvan July office.  6. Follow-up:     a. He should expect contact from the outpatient diabetes education center        for diabetes classes.     b. Dr. Mikeal Hawthorne should see him at the [facility]       approximately a week after discharge, specifically to discuss his        diabetes medications.     c. Dr. [name] would like to see him in his office on March 28, 2003, at 2:15 p.m.  He will have a chest x-ray that day.     d. Dr. Cornelius Moras would like to see him at the [facility] office in approximately        three  weeks.  The office will call for date and time for this        appointment.     e. Dr. Russella Dar would like to see him in his office in mid to late March of        this year.  He has been asked to call to arrange that appointment and        he has been given the office phone number.      Toribio Harbour, N.P.                  Salvatore Decent. Cornelius Moras, M.D.    CTK/MEDQ  D:  03/14/2003  T:  03/15/2003  Job:  [id number]   cc:   Lonia Blood, M.D.  [address] Resident  [place], [place] [address]  Fax: [phone]

## 2010-07-17 ENCOUNTER — Encounter: Payer: Self-pay | Admitting: Cardiology

## 2010-08-12 ENCOUNTER — Encounter: Payer: Self-pay | Admitting: Cardiology

## 2010-08-12 ENCOUNTER — Ambulatory Visit (INDEPENDENT_AMBULATORY_CARE_PROVIDER_SITE_OTHER): Payer: Medicare Other | Admitting: Cardiology

## 2010-08-12 VITALS — BP 96/78 | HR 73 | Resp 16 | Ht 71.0 in | Wt 282.0 lb

## 2010-08-12 DIAGNOSIS — G4730 Sleep apnea, unspecified: Secondary | ICD-10-CM

## 2010-08-12 DIAGNOSIS — E669 Obesity, unspecified: Secondary | ICD-10-CM

## 2010-08-12 DIAGNOSIS — I2510 Atherosclerotic heart disease of native coronary artery without angina pectoris: Secondary | ICD-10-CM

## 2010-08-12 DIAGNOSIS — R609 Edema, unspecified: Secondary | ICD-10-CM | POA: Insufficient documentation

## 2010-08-12 DIAGNOSIS — E119 Type 2 diabetes mellitus without complications: Secondary | ICD-10-CM

## 2010-08-12 DIAGNOSIS — IMO0002 Reserved for concepts with insufficient information to code with codable children: Secondary | ICD-10-CM

## 2010-08-12 DIAGNOSIS — E785 Hyperlipidemia, unspecified: Secondary | ICD-10-CM

## 2010-08-12 NOTE — Assessment & Plan Note (Signed)
He does not wear the CPAP as he should.  I encouraged better compliance.

## 2010-08-12 NOTE — Assessment & Plan Note (Signed)
The blood pressure is at target. No change in medications is indicated. We will continue with therapeutic lifestyle changes (TLC).  

## 2010-08-12 NOTE — Assessment & Plan Note (Signed)
I discussed conservative therapies .  I will check an echo to evaluate for the possibility of elevated pulmonary pressures especially in the face of his sleep apnea.

## 2010-08-12 NOTE — Progress Notes (Signed)
HPI The patient presents for follow up of known CAD. At the last appt I sent him for a stress perfusion study.  This demonstrated no ischemia or infarct.  The patient denies any new symptoms such as chest discomfort, neck or arm discomfort. There has been no new shortness of breath, PND or orthopnea. There have been no reported palpitations, presyncope or syncope.  He has not been successful with weight loss and he has had continued lower extremity edema.   Allergies  Allergen Reactions  . Ibuprofen     Current Outpatient Prescriptions  Medication Sig Dispense Refill  . aspirin EC 325 MG tablet Take 325 mg by mouth daily.        Marland Kitchen eplerenone (INSPRA) 25 MG tablet Take 1 tablet (25 mg total) by mouth daily.  30 tablet  8  . Febuxostat (ULORIC) 80 MG TABS Take by mouth.        . furosemide (LASIX) 20 MG tablet Take 20 mg by mouth 2 (two) times daily.        . insulin glargine (LANTUS) 100 UNIT/ML injection Inject into the skin as directed.        . insulin lispro protamine-insulin lispro (HUMALOG MIX 75/25) (75-25) 100 UNIT/ML SUSP Inject into the skin as directed.        Marland Kitchen losartan (COZAAR) 50 MG tablet Take 50 mg by mouth daily.        . metoprolol (LOPRESSOR) 50 MG tablet take 1 tablet by mouth once daily  90 tablet  3  . nitroGLYCERIN (NITROSTAT) 0.4 MG SL tablet Place 0.4 mg under the tongue every 5 (five) minutes as needed.        . simvastatin (ZOCOR) 80 MG tablet Take 80 mg by mouth daily.        Marland Kitchen DISCONTD: metoprolol (LOPRESSOR) 50 MG tablet Take 1 tablet (50 mg total) by mouth daily.  30 tablet  11    Past Medical History  Diagnosis Date  . CAD (coronary artery disease)     status post CABG in 2005 w LIMA to LAD, left radial to second circumflex marginal, saphenous vein graft to PDA, saphenous vein graft to lateral subbrach of ramus intermediate, and sequential saphenous vein graft to the medial subbranch of ramus intermediate  . Acute pancreatitis   . Cirrhosis of liver without  mention of alcohol   . Type II or unspecified type diabetes mellitus without mention of complication, not stated as uncontrolled   . Bronchitis, not specified as acute or chronic   . Other and unspecified hyperlipidemia   . Unspecified sleep apnea   . Acute myocardial infarction, unspecified site, episode of care unspecified   . Complications affecting other specified body systems, hypertension     Past Surgical History  Procedure Date  . Ent surgery   . Coronary artery bypass graft     ROS:  As stated in the HPI and negative for all other systems.  PHYSICAL EXAM BP 96/78  Pulse 73  Resp 16  Ht 5\' 11"  (1.803 m)  Wt 282 lb (127.914 kg)  BMI 39.33 kg/m2 GENERAL:  Well appearing HEENT:  Pupils equal round and reactive, fundi not visualized, oral mucosa unremarkable NECK:  7 cm jugular venous distention, waveform within normal limits, carotid upstroke brisk and symmetric, no bruits, no thyromegaly LYMPHATICS:  No cervical, inguinal adenopathy LUNGS:  Clear to auscultation bilaterally BACK:  No CVA tenderness CHEST:  Well healed sternotomy scar. HEART:  PMI not displaced or sustained,S1  and S2 within normal limits, no S3, no S4, no clicks, no rubs, no murmurs ABD:  Flat, positive bowel sounds normal in frequency in pitch, no bruits, no rebound, no guarding, no midline pulsatile mass, no hepatomegaly, no splenomegaly EXT:  2 plus pulses throughout, moderate edema, no cyanosis no clubbing, right radial scar SKIN:  No rashes no nodules NEURO:  Cranial nerves II through XII grossly intact, motor grossly intact throughout PSYCH:  Cognitively intact, oriented to person place and time   EKG:  Normal sinus rhythm, rate 73, right bundle branch block, left atrial enlargement, QTC prolonged, no acute ST-T wave changes.  ASSESSMENT AND PLAN

## 2010-08-12 NOTE — Assessment & Plan Note (Signed)
The patient has no new sypmtoms.  No further cardiovascular testing is indicated.  We will continue with aggressive risk reduction and meds as listed.  

## 2010-08-12 NOTE — Assessment & Plan Note (Signed)
The patient understands the need to lose weight with diet and exercise. We have discussed specific strategies for this.  

## 2010-08-12 NOTE — Patient Instructions (Signed)
You are being scheduled for a 2 D Echo due to your edema. This is to look at your heart pumping function. Please continue your current medications as listed Follow up with Dr Antoine Poche in 6 months.

## 2010-08-13 ENCOUNTER — Ambulatory Visit (HOSPITAL_COMMUNITY): Payer: Medicare Other | Attending: Family Medicine | Admitting: Radiology

## 2010-08-13 DIAGNOSIS — E119 Type 2 diabetes mellitus without complications: Secondary | ICD-10-CM | POA: Insufficient documentation

## 2010-08-13 DIAGNOSIS — I10 Essential (primary) hypertension: Secondary | ICD-10-CM | POA: Insufficient documentation

## 2010-08-13 DIAGNOSIS — R609 Edema, unspecified: Secondary | ICD-10-CM

## 2010-08-13 DIAGNOSIS — I079 Rheumatic tricuspid valve disease, unspecified: Secondary | ICD-10-CM | POA: Insufficient documentation

## 2010-08-13 DIAGNOSIS — I2510 Atherosclerotic heart disease of native coronary artery without angina pectoris: Secondary | ICD-10-CM | POA: Insufficient documentation

## 2010-08-13 DIAGNOSIS — E785 Hyperlipidemia, unspecified: Secondary | ICD-10-CM | POA: Insufficient documentation

## 2010-09-27 ENCOUNTER — Other Ambulatory Visit: Payer: Self-pay | Admitting: *Deleted

## 2010-09-27 MED ORDER — LOSARTAN POTASSIUM 50 MG PO TABS: 50.0000 mg | ORAL_TABLET | Freq: Every day | ORAL | Status: DC

## 2010-09-27 NOTE — Telephone Encounter (Signed)
rx sent in today

## 2010-11-13 ENCOUNTER — Other Ambulatory Visit: Payer: Self-pay

## 2010-11-13 MED ORDER — NITROGLYCERIN 0.4 MG SL SUBL: 0.4000 mg | SUBLINGUAL_TABLET | SUBLINGUAL | Status: DC | PRN

## 2010-11-14 ENCOUNTER — Telehealth: Payer: Self-pay | Admitting: Cardiology

## 2010-11-14 NOTE — Telephone Encounter (Signed)
Pharmacy calling wanting clarification of nitroglycerin rx please call

## 2010-11-14 NOTE — Telephone Encounter (Signed)
Called [name] [name] - NTG will be refilled with 4 bottle of #25 tablets.

## 2010-12-19 ENCOUNTER — Telehealth: Payer: Self-pay | Admitting: Cardiology

## 2010-12-19 NOTE — Telephone Encounter (Signed)
Pt wants to talk to you about procedure he is having please call

## 2010-12-20 ENCOUNTER — Telehealth: Payer: Self-pay | Admitting: Cardiology

## 2010-12-20 NOTE — Telephone Encounter (Signed)
Pt calling because he is scheduled to have a colonoscopy and wants to know if it is OK.  He is esp. concerned about his DM.  Instructed pt that the MD doing the procedure should instruct him on how to take his insulin and what to do if his BS drops.  He will call them on Monday morning.

## 2010-12-20 NOTE — Telephone Encounter (Signed)
Home phone number disconnected. Other number rang and someone picked up but wouldn't say anything.

## 2010-12-20 NOTE — Telephone Encounter (Signed)
Pt rtn call to pam °

## 2011-02-21 ENCOUNTER — Telehealth: Payer: Self-pay | Admitting: Cardiology

## 2011-02-21 MED ORDER — ALPRAZOLAM 0.25 MG PO TABS: 0.2500 mg | ORAL_TABLET | Freq: Three times a day (TID) | ORAL | Status: DC | PRN

## 2011-02-21 NOTE — Telephone Encounter (Signed)
New Problem:    A relative of the patient called in to report, while the patient sat nearby (perfectly coherent with his operational cell phone) that his sister passed and he is having chest tightness when he thinks about her. He is not taking his nitro pills but would like you to prescribe something to help him calm down when his chest gets tight. His pharmacy is [facility] on [address]. Please call back.

## 2011-02-21 NOTE — Telephone Encounter (Signed)
Xanax 2.5 mg q8 hours prn anxiety.  Disp number 20 no refills.

## 2011-02-21 NOTE — Telephone Encounter (Signed)
Will forward to MD.  

## 2011-04-11 ENCOUNTER — Telehealth: Payer: Self-pay | Admitting: Internal Medicine

## 2011-04-11 MED ORDER — AZITHROMYCIN 250 MG PO TABS: ORAL_TABLET | ORAL | Status: AC

## 2011-04-11 NOTE — Telephone Encounter (Signed)
Called, spoke with pt.    He c/o runny nose, prod cough with a small amount of white mucus, and increased SOB x 1 wk.  Denies wheezing, chest tightness, and f/c/s.  He is requesting rx to be sent to pharm.  However, he was last seen by Dr. Maple Hudson 10/20/2008.  He is aware he will need to come in for OV.  We did schedule this for May 07, 2011 (first available) but pt would like to be worked in sooner as he is sick now.  Dr. Delena Bali, pls advise if pt can be worked in?  Thanks!  [facility] [address] [employer].   Allergies verified Allergies  Allergen Reactions  . Ibuprofen

## 2011-04-11 NOTE — Telephone Encounter (Signed)
Per CY-okay to give Zpak #1 take as directed no refills and keep appt for 05-07-11 to follow up with CY. Patient is aware of RX sent and to keep appt.

## 2011-04-23 ENCOUNTER — Telehealth: Payer: Self-pay | Admitting: Internal Medicine

## 2011-04-23 NOTE — Telephone Encounter (Signed)
This has been signed and faxed back 

## 2011-04-23 NOTE — Telephone Encounter (Signed)
Form received and placed on CDY cart for review. 

## 2011-04-23 NOTE — Telephone Encounter (Signed)
[name] [name] with [facility] refaxing forms for Dr. Young. 

## 2011-04-30 ENCOUNTER — Telehealth: Payer: Self-pay | Admitting: Internal Medicine

## 2011-04-30 NOTE — Telephone Encounter (Signed)
[facility] WHERE FORM IS SO WE CAN REFAX--THANKS TD

## 2011-05-01 NOTE — Telephone Encounter (Signed)
I called [facility] and asked that they refax another form as the one we faxed back to them has been sent to HIM to scan in [facility] goes to [address] scanning office. I will await a call back as I left this on voicemail for the pharmacy department at [facility].

## 2011-05-02 NOTE — Telephone Encounter (Signed)
This information is on CY's cart to sign and refax to [facility].

## 2011-05-02 NOTE — Telephone Encounter (Signed)
CY has signed the paperwork and I have faxed back to [facility]; sent to HIM to scan in [facility].

## 2011-05-07 ENCOUNTER — Ambulatory Visit (INDEPENDENT_AMBULATORY_CARE_PROVIDER_SITE_OTHER): Payer: Medicare Other | Admitting: Internal Medicine

## 2011-05-07 ENCOUNTER — Encounter: Payer: Self-pay | Admitting: Internal Medicine

## 2011-05-07 VITALS — BP 110/58 | HR 74 | Ht 71.0 in | Wt 291.0 lb

## 2011-05-07 DIAGNOSIS — J40 Bronchitis, not specified as acute or chronic: Secondary | ICD-10-CM

## 2011-05-07 DIAGNOSIS — I219 Acute myocardial infarction, unspecified: Secondary | ICD-10-CM

## 2011-05-07 DIAGNOSIS — J45909 Unspecified asthma, uncomplicated: Secondary | ICD-10-CM

## 2011-05-07 DIAGNOSIS — G4730 Sleep apnea, unspecified: Secondary | ICD-10-CM

## 2011-05-07 DIAGNOSIS — G4733 Obstructive sleep apnea (adult) (pediatric): Secondary | ICD-10-CM

## 2011-05-07 MED ORDER — FLUTICASONE PROPIONATE 50 MCG/ACT NA SUSP: NASAL | Status: DC

## 2011-05-07 NOTE — Patient Instructions (Signed)
Order- DME- [facility]ra    Autotitrate CPAP for pressure recommendation 5-20 cwp x 2 weeks   Dx OSA  Order- schedule PFT dx asthma  Script sent for Flonase nasal spray for allergy

## 2011-05-07 NOTE — Progress Notes (Signed)
05/06/11- 31 yoM former smoker with sleep apnea, bronchitis, SAR, CAD/MI/CABG LOV-10/20/08  He comes for recheck. Has not worn CPAP since MI/CABG. Thinks he is breathing well. Does admit he needs to restart CPAP. Daytime sleepiness, snoring, aware he stops breathing at night.  Occasional cough with cold. Does get "congested" with a little wheeze and he coughs with grass mowing. Still has a nebulizer machine which helps.  ROS-see HPI Constitutional:   No-   weight loss, night sweats, fevers, chills, fatigue, lassitude. HEENT:   No-  headaches, difficulty swallowing, tooth/dental problems, sore throat,       No-  sneezing, itching, ear ache, nasal congestion, post nasal drip,  CV:  No-   chest pain, orthopnea, PND, swelling in lower extremities, anasarca,  dizziness, palpitations Resp: + shortness of breath with exertion or at rest.              No-   productive cough,  No non-productive cough,  No- coughing up of blood.              No-   change in color of mucus.  + wheezing.   Skin: No-   rash or lesions. GI:  No-   heartburn, indigestion, abdominal pain, nausea, vomiting,  GU: . MS:  No-   joint pain or swelling.   Neuro-     nothing unusual Psych:  No- change in mood or affect. No depression or anxiety.  No memory loss.   OBJ- Physical Exam General- Alert, Oriented, Affect-appropriate, Distress- none acute, obese Skin- rash-none, lesions- none, excoriation- none Lymphadenopathy- none Head- atraumatic            Eyes- Gross vision intact, PERRLA, conjunctivae and secretions clear            Ears- Hearing, canals-normal            Nose- Clear, no-Septal dev, mucus, polyps, erosion, perforation             Throat- Mallampati IV , mucosa clear , drainage- none, tonsils- atrophic, missing teeth Neck- flexible , trachea midline, no stridor , thyroid nl, carotid no bruit Chest - symmetrical excursion , unlabored           Heart/CV- RRR , no murmur , no gallop  , no rub, nl s1 s2                 - JVD- none , edema- none, stasis changes- none, varices- none           Lung- clear to P&A, wheeze- none, cough- none , dullness-none, rub- none           Chest wall-  Abd-  Br/ Gen/ Rectal- Not done, not indicated Extrem- cyanosis- none, clubbing, none, atrophy- none, strength- nl. Vascular graft donor site scar left forearm. Neuro- grossly intact to observation

## 2011-05-11 NOTE — Assessment & Plan Note (Signed)
Plan PFT 

## 2011-05-11 NOTE — Assessment & Plan Note (Signed)
He had been using BiPAP because he could not tolerate necessary pressures by CPAP in the past. [employer].  Plan- autotitration.

## 2011-05-14 ENCOUNTER — Other Ambulatory Visit: Payer: Self-pay | Admitting: *Deleted

## 2011-05-14 MED ORDER — EPLERENONE 25 MG PO TABS: 25.0000 mg | ORAL_TABLET | Freq: Every day | ORAL | Status: DC

## 2011-05-23 ENCOUNTER — Telehealth: Payer: Self-pay | Admitting: Internal Medicine

## 2011-05-23 MED ORDER — FLUTICASONE PROPIONATE 50 MCG/ACT NA SUSP: NASAL | Status: DC

## 2011-05-23 NOTE — Telephone Encounter (Signed)
P 

## 2011-05-23 NOTE — Telephone Encounter (Signed)
Pt needed RX resent to pharmacy.

## 2011-05-23 NOTE — Telephone Encounter (Signed)
Will send back to [facility] to hold until papers come through for CY.

## 2011-05-23 NOTE — Telephone Encounter (Signed)
Per CY-yes okay to order.

## 2011-05-23 NOTE — Telephone Encounter (Signed)
I spoke with patient to find out what was going on; he advised that he is having more increased SOB and coughing-doesn't feel like he needs and abx or cough syrup at this time; however [facility] is requesting to do pulse ox test on patient. CY Please advise if you are okay with sending order. Thanks    P.S. Pt rescheduled his PFT from today 05-23-2011 to 05-29-2011. Didn't feel he could perform the test today.

## 2011-06-10 ENCOUNTER — Telehealth: Payer: Self-pay | Admitting: Internal Medicine

## 2011-06-10 DIAGNOSIS — J449 Chronic obstructive pulmonary disease, unspecified: Secondary | ICD-10-CM

## 2011-06-10 NOTE — Telephone Encounter (Signed)
I spoke with patient about results and he verbalized understanding and had no questions. Order has been sent to [facility] per pt request

## 2011-06-10 NOTE — Telephone Encounter (Signed)
On ONOX 06/05/11-  He desaturated on room air almost 34 minutes.  Recommend - order DME Home O2 for sleep 2 l/m  Dx COPD

## 2011-06-10 NOTE — Telephone Encounter (Signed)
Called, spoke with [place].  Calling to check on the status of o2 order that was faxed on 06/06/11.  States he faxed over order for o2 along with a summary on the overnight pulse ox.  Dr. Maple Hudson, have you seen this?  Pls advise.  Thank you.

## 2011-06-12 ENCOUNTER — Encounter: Payer: Self-pay | Admitting: Internal Medicine

## 2011-06-13 ENCOUNTER — Telehealth: Payer: Self-pay | Admitting: Internal Medicine

## 2011-06-13 NOTE — Telephone Encounter (Signed)
I spoke with [name] [name] from [facility] and she states they have already received order and pt is already set up. She is going to call jake and make him aware of this. Nothing further was needed

## 2011-06-18 ENCOUNTER — Ambulatory Visit (INDEPENDENT_AMBULATORY_CARE_PROVIDER_SITE_OTHER): Payer: Medicare Other | Admitting: Internal Medicine

## 2011-06-18 DIAGNOSIS — J40 Bronchitis, not specified as acute or chronic: Secondary | ICD-10-CM

## 2011-06-18 DIAGNOSIS — R0602 Shortness of breath: Secondary | ICD-10-CM

## 2011-06-18 LAB — PULMONARY FUNCTION TEST

## 2011-06-18 NOTE — Progress Notes (Signed)
PFT done today. 

## 2011-06-19 ENCOUNTER — Ambulatory Visit (INDEPENDENT_AMBULATORY_CARE_PROVIDER_SITE_OTHER): Payer: Medicare Other | Admitting: Internal Medicine

## 2011-06-19 ENCOUNTER — Ambulatory Visit (INDEPENDENT_AMBULATORY_CARE_PROVIDER_SITE_OTHER)
Admission: RE | Admit: 2011-06-19 | Discharge: 2011-06-19 | Disposition: A | Discharge status: Home / Self Care | Payer: Medicare Other | Source: Ambulatory Visit | Attending: Internal Medicine | Admitting: Internal Medicine

## 2011-06-19 ENCOUNTER — Encounter: Payer: Self-pay | Admitting: Internal Medicine

## 2011-06-19 VITALS — BP 110/62 | HR 77 | Ht 71.0 in | Wt 292.0 lb

## 2011-06-19 DIAGNOSIS — G4730 Sleep apnea, unspecified: Secondary | ICD-10-CM

## 2011-06-19 DIAGNOSIS — R0989 Other specified symptoms and signs involving the circulatory and respiratory systems: Secondary | ICD-10-CM

## 2011-06-19 DIAGNOSIS — R0600 Dyspnea, unspecified: Secondary | ICD-10-CM

## 2011-06-19 DIAGNOSIS — J40 Bronchitis, not specified as acute or chronic: Secondary | ICD-10-CM

## 2011-06-19 DIAGNOSIS — R0609 Other forms of dyspnea: Secondary | ICD-10-CM

## 2011-06-19 NOTE — Patient Instructions (Signed)
Continue oxygen at night at 2 L  Work with Christoper Allegra to do the CPAP download as planned  Order- CXR   Dx dyspnea

## 2011-06-19 NOTE — Progress Notes (Signed)
05/06/11- 58 yoM former smoker with sleep apnea, bronchitis, SAR, CAD/MI/CABG LOV-10/20/08  He comes for recheck. Has not worn CPAP since MI/CABG. Thinks he is breathing well. Does admit he needs to restart CPAP. Daytime sleepiness, snoring, aware he stops breathing at night.  Occasional cough with cold. Does get "congested" with a little wheeze and he coughs with grass mowing. Still has a nebulizer machine which helps.  06/19/11- 89 yoM former smoker with sleep apnea, bronchitis, SAR, CAD/MI/CABG Not wearing CPAP at this time-getting with [facility] to check pressure to restart; However he is still using O2; review PFT with patient. Has not used CPAP since his CABG in 2005, planning autotitration with his DME company/ [facility] to restart as discussed previously. Continues oxygen 2 L for sleep/ [employer]. PFT 06/18/2011-moderate restriction. Flows are normal for measure volume with FEV1/FVC 0.84 and insignificant response to bronchodilator. DLCO 52%.  ROS-see HPI Constitutional:   No-   weight loss, night sweats, fevers, chills, fatigue, lassitude. HEENT:   No-  headaches, difficulty swallowing, tooth/dental problems, sore throat,       No-  sneezing, itching, ear ache, nasal congestion, post nasal drip,  CV:  No-   chest pain, orthopnea, PND, swelling in lower extremities, anasarca,  dizziness, palpitations Resp: + shortness of breath with exertion or at rest.              No-   productive cough,  No non-productive cough,  No- coughing up of blood.              No-   change in color of mucus.  + wheezing.   Skin: No-   rash or lesions. GI:  No-   heartburn, indigestion, abdominal pain, nausea, vomiting,  GU: . MS:  No-   joint pain or swelling.   Neuro-     nothing unusual Psych:  No- change in mood or affect. No depression or anxiety.  No memory loss.   OBJ- Physical Exam General- Alert, Oriented, Affect-appropriate, Distress- none acute, obese Skin- rash-none, lesions- none, excoriation- none.  Thickened skin on back ? Lipodystrophy? Brother is similar. Lymphadenopathy- none Head- atraumatic            Eyes- Gross vision intact, PERRLA, conjunctivae and secretions clear            Ears- Hearing, canals-normal            Nose- Clear, no-Septal dev, mucus, polyps, erosion, perforation             Throat- Mallampati IV , mucosa clear , drainage- none, tonsils- atrophic, missing teeth Neck- flexible , trachea midline, no stridor , thyroid nl, carotid no bruit Chest - symmetrical excursion , unlabored           Heart/CV- RRR , no murmur , no gallop  , no rub, nl s1 s2                           - JVD- none , edema- none, stasis changes- none, varices- none           Lung- clear to P&A, wheeze- none, cough- none , dullness-none, rub- none           Chest wall- sternotomy scar Abd-  Br/ Gen/ Rectal- Not done, not indicated Extrem- cyanosis- none, clubbing, none, atrophy- none, strength- nl. Vascular graft donor site scar left forearm. Neuro- grossly intact to observation

## 2011-06-22 NOTE — Assessment & Plan Note (Signed)
Last chest x-ray in 2010 showed cardiomegaly and mild bronchitis changes. I'm not sure if these are the basis for a moderate restrictive deficit PFT. Plan-chest x-ray

## 2011-06-22 NOTE — Assessment & Plan Note (Signed)
Medical importance of CPAP therapy for sleep apnea was reviewed in the context of his cardiac disease and complex medical problems. He expresses intent to go forward with CPAP.

## 2011-06-26 NOTE — Progress Notes (Signed)
Quick Note:  Pt aware of results. ______ 

## 2011-06-27 ENCOUNTER — Encounter: Payer: Self-pay | Admitting: Internal Medicine

## 2011-09-02 ENCOUNTER — Telehealth: Payer: Self-pay | Admitting: Internal Medicine

## 2011-09-02 NOTE — Telephone Encounter (Signed)
Called, spoke with pt's brother, Link Snuffer, who is requesting we send an order to [place] for pt's bipap machine because [facility] cancelled the previous order bc pt did not show up for his appts.  Link Snuffer was not exactly sure of what the order needed to say so advised I would call [employer] to clarify this.  Sharyn Creamer at [phone], spoke with Okey Regal who states they did cancel the order sent from our office in March bc pt did not come in for his appt.  She is unsure why pt is calling here for a new order and requesting to call his brother, Link Snuffer, to clarify what they are trying to do.  Okey Regal states she will call back with an update.

## 2011-09-02 NOTE — Telephone Encounter (Signed)
Okey Regal called back.  States she spoke with pt's brother, Link Snuffer.  Pt does have a bipap but Eddie reported to [place] that it is messed up.  Okey Regal advised Eddie to have pt contact them.  They will then see what is wrong with the machine.  If anything is needed from our office, Okey Regal states Christoper Allegra will then contact us for what is needed.  Nothing further needed at this time.  I did call Link Snuffer, pt's brother.  He is understanding of plan and voiced no further questions/concerns at this time.

## 2011-10-10 ENCOUNTER — Other Ambulatory Visit: Payer: Self-pay | Admitting: Cardiology

## 2011-10-10 NOTE — Telephone Encounter (Signed)
Pt uses [facility], for effient

## 2011-10-14 ENCOUNTER — Telehealth: Payer: Self-pay | Admitting: Cardiology

## 2011-10-14 MED ORDER — EPLERENONE 25 MG PO TABS: 25.0000 mg | ORAL_TABLET | Freq: Every day | ORAL | Status: DC

## 2011-10-14 NOTE — Telephone Encounter (Signed)
Fax Received. Refill Completed. [name] [name] (R.M.A)   

## 2011-10-14 NOTE — Telephone Encounter (Signed)
F/u   Pt called to f/u on status of refill as verified preferred pharmacy.  No rx to pick up, please research and call pt to advise.

## 2011-10-20 ENCOUNTER — Other Ambulatory Visit: Payer: Self-pay | Admitting: *Deleted

## 2011-10-20 MED ORDER — NITROGLYCERIN 0.4 MG SL SUBL: 0.4000 mg | SUBLINGUAL_TABLET | SUBLINGUAL | Status: DC | PRN

## 2011-10-21 ENCOUNTER — Encounter: Payer: Self-pay | Admitting: Cardiology

## 2011-10-21 ENCOUNTER — Ambulatory Visit (INDEPENDENT_AMBULATORY_CARE_PROVIDER_SITE_OTHER): Payer: Medicare Other | Admitting: Cardiology

## 2011-10-21 VITALS — BP 120/74 | HR 77 | Ht 71.0 in | Wt 277.4 lb

## 2011-10-21 DIAGNOSIS — I219 Acute myocardial infarction, unspecified: Secondary | ICD-10-CM

## 2011-10-21 DIAGNOSIS — R079 Chest pain, unspecified: Secondary | ICD-10-CM

## 2011-10-21 DIAGNOSIS — IMO0002 Reserved for concepts with insufficient information to code with codable children: Secondary | ICD-10-CM

## 2011-10-21 DIAGNOSIS — I739 Peripheral vascular disease, unspecified: Secondary | ICD-10-CM

## 2011-10-21 DIAGNOSIS — E785 Hyperlipidemia, unspecified: Secondary | ICD-10-CM

## 2011-10-21 DIAGNOSIS — I2510 Atherosclerotic heart disease of native coronary artery without angina pectoris: Secondary | ICD-10-CM

## 2011-10-21 NOTE — Progress Notes (Signed)
HPI The patient presents for follow up of known CAD.  Since I last saw him he has had some discomfort in his chest.  He thinks that this might have been indigestion. In retrospect he never really had any symptoms with his previous heart attack. He's not exercising but he does push a lawnmower and says he does not bring on chest discomfort. He denies any new shortness of breath, PND or orthopnea. He's had no palpitations, presyncope or syncope. He's not been eating as much and has had a 15 pound weight loss. She's had less swelling in his legs.    Allergies  Allergen Reactions  . Ibuprofen     Current Outpatient Prescriptions  Medication Sig Dispense Refill  . ALPRAZolam (XANAX) 0.25 MG tablet Take 1 tablet (0.25 mg total) by mouth 3 (three) times daily as needed for sleep (for anxiety).  20 tablet  0  . aspirin EC 325 MG tablet Take 325 mg by mouth daily.        Marland Kitchen eplerenone (INSPRA) 25 MG tablet Take 1 tablet (25 mg total) by mouth daily.  30 tablet  4  . Febuxostat (ULORIC) 80 MG TABS Take by mouth.        . fluticasone (FLONASE) 50 MCG/ACT nasal spray 1-2 puffs into each nostril, every night at bedtime  16 g  prn  . furosemide (LASIX) 20 MG tablet Take 20 mg by mouth 2 (two) times daily.        . insulin glargine (LANTUS) 100 UNIT/ML injection Inject into the skin as directed.        . insulin lispro protamine-insulin lispro (HUMALOG MIX 75/25) (75-25) 100 UNIT/ML SUSP Inject into the skin as directed.        Marland Kitchen losartan (COZAAR) 50 MG tablet Take 1 tablet (50 mg total) by mouth daily.  90 tablet  3  . metoprolol (LOPRESSOR) 50 MG tablet take 1 tablet by mouth once daily  90 tablet  3  . nitroGLYCERIN (NITROSTAT) 0.4 MG SL tablet Place 1 tablet (0.4 mg total) under the tongue every 5 (five) minutes as needed.  25 tablet  3  . rosuvastatin (CRESTOR) 40 MG tablet Take 40 mg by mouth daily.      Marland Kitchen DISCONTD: simvastatin (ZOCOR) 80 MG tablet Take 80 mg by mouth daily.          Past Medical  History  Diagnosis Date  . CAD (coronary artery disease)     status post CABG in 2005 w LIMA to LAD, left radial to second circumflex marginal, saphenous vein graft to PDA, saphenous vein graft to lateral subbrach of ramus intermediate, and sequential saphenous vein graft to the medial subbranch of ramus intermediate  . Acute pancreatitis   . Cirrhosis of liver without mention of alcohol   . Type II or unspecified type diabetes mellitus without mention of complication, not stated as uncontrolled   . Bronchitis, not specified as acute or chronic   . Other and unspecified hyperlipidemia   . Unspecified sleep apnea   . Acute myocardial infarction, unspecified site, episode of care unspecified   . Complications affecting other specified body systems, hypertension     Past Surgical History  Procedure Date  . Ent surgery   . Coronary artery bypass graft     ROS:  As stated in the HPI and negative for all other systems.  PHYSICAL EXAM BP 120/74  Pulse 77  Ht 5\' 11"  (1.803 m)  Wt 277 lb 6.4  oz (125.828 kg)  BMI 38.69 kg/m2 GENERAL:  Well appearing HEENT:  Pupils equal round and reactive, fundi not visualized, oral mucosa unremarkable, poor dentition.   NECK:  No jugular venous distention, waveform within normal limits, carotid upstroke brisk and symmetric, no bruits, no thyromegaly LYMPHATICS:  No cervical, inguinal adenopathy LUNGS:  Clear to auscultation bilaterally BACK:  No CVA tenderness CHEST:  Well healed sternotomy scar. HEART:  PMI not displaced or sustained,S1 and S2 within normal limits, no S3, no S4, no clicks, no rubs, no murmurs ABD:  Flat, positive bowel sounds normal in frequency in pitch, no bruits, no rebound, no guarding, no midline pulsatile mass, no hepatomegaly, no splenomegaly, obese, umbilical hernia EXT:  2 plus pulses throughout, trace edema, no cyanosis no clubbing, right radial scar SKIN:  No rashes no nodules NEURO:  Cranial nerves II through XII grossly  intact, motor grossly intact throughout PSYCH:  Cognitively intact, oriented to person place and time   EKG:  Normal sinus rhythm, rate 74, right bundle branch block, left atrial enlargement, QTC prolonged, no acute ST-T wave changes.  10/21/2011  ASSESSMENT AND PLAN  CAD -  Given his chest discomfort now I would like to screen him with an exercise treadmill test. However, he says he would not be a walk on a treadmill. Therefore, he will have a [employer].   OBESITY, UNSPECIFIED -  I am so proud of him for his weight loss and I encourage more of the same.    Edema -  This resolved.  No change in therapy is indicated.   Hyperlipidemia - He is followed by endocrinology for management of this and his diabetes. I will defer to their management.   HYPERTENSION NEC -  The blood pressure is at target. No change in medications is indicated. We will continue with therapeutic lifestyle changes (TLC).

## 2011-10-21 NOTE — Patient Instructions (Addendum)
The current medical regimen is effective;  continue present plan and medications.  Your physician has requested that you have a lexiscan myoview. For further information please visit [email or web address]. Please follow instruction sheet, as given.  Follow up in 1 year with Dr Hochrein.  You will receive a letter in the mail 2 months before you are due.  Please call us when you receive this letter to schedule your follow up appointment.  

## 2011-10-23 ENCOUNTER — Ambulatory Visit (INDEPENDENT_AMBULATORY_CARE_PROVIDER_SITE_OTHER): Payer: Medicare Other | Admitting: Internal Medicine

## 2011-10-23 ENCOUNTER — Encounter: Payer: Self-pay | Admitting: Internal Medicine

## 2011-10-23 VITALS — BP 122/60 | HR 73 | Ht 71.0 in | Wt 280.2 lb

## 2011-10-23 DIAGNOSIS — Z23 Encounter for immunization: Secondary | ICD-10-CM

## 2011-10-23 DIAGNOSIS — G4733 Obstructive sleep apnea (adult) (pediatric): Secondary | ICD-10-CM

## 2011-10-23 DIAGNOSIS — J40 Bronchitis, not specified as acute or chronic: Secondary | ICD-10-CM

## 2011-10-23 MED ORDER — ALBUTEROL SULFATE HFA 108 (90 BASE) MCG/ACT IN AERS: 2.0000 | INHALATION_SPRAY | RESPIRATORY_TRACT | Status: DC | PRN

## 2011-10-23 NOTE — Progress Notes (Signed)
05/06/11- 85 yoM former smoker with sleep apnea, bronchitis, SAR, CAD/MI/CABG LOV-10/20/08  He comes for recheck. Has not worn CPAP since MI/CABG. Thinks he is breathing well. Does admit he needs to restart CPAP. Daytime sleepiness, snoring, aware he stops breathing at night.  Occasional cough with cold. Does get "congested" with a little wheeze and he coughs with grass mowing. Still has a nebulizer machine which helps.  06/19/11- 18 yoM former smoker with sleep apnea, bronchitis, SAR, CAD/MI/CABG Not wearing CPAP at this time-getting with [employer] to check pressure to restart; However he is still using O2; review PFT with patient. Has not used CPAP since his CABG in 2005, planning autotitration with his DME company/ [employer] to restart as discussed previously. Continues oxygen 2 L for sleep/ [employer]. PFT 06/18/2011-moderate restriction. Flows are normal for measure volume with FEV1/FVC 0.84 and insignificant response to bronchodilator. DLCO 52%.  10/23/11- 1 yoM former smoker with sleep apnea, bronchitis, SAR, CAD/MI/CABG    wife here Not using CPAP (due to recent moved) and has not used O2 in about 1 month. They will be in their new home until next month. Wife confirms that he is snoring now without his CPAP. He is pending a cardiac stress test and I again explained concerns of untreated sleep apnea and hypoxemia with background cardiac disease. He feels well. Occasional use of rescue inhaler with little wheeze or cough. CXR 06/26/11-  IMPRESSION:  Stable cardiac enlargement.  Original Report Authenticated By: Rosealee Albee, M.D.   ROS-see HPI Constitutional:   No-   weight loss, night sweats, fevers, chills, fatigue, lassitude. HEENT:   No-  headaches, difficulty swallowing, tooth/dental problems, sore throat,       No-  sneezing, itching, ear ache, nasal congestion, post nasal drip,  CV:  No-   chest pain, orthopnea, PND, swelling in lower extremities, anasarca,  dizziness, palpitations Resp: +  shortness of breath with exertion or at rest.              No-   productive cough,  No non-productive cough,  No- coughing up of blood.              No-   change in color of mucus.  + Occasional wheezing.   Skin: No-   rash or lesions. GI:  No-   heartburn, indigestion, abdominal pain, nausea, vomiting,  GU: . MS:  No-   joint pain or swelling.   Neuro-     nothing unusual Psych:  No- change in mood or affect. No depression or anxiety.  No memory loss.   OBJ- Physical Exam General- Alert, Oriented, Affect-appropriate, Distress- none acute, obese Skin- rash-none, lesions- none, excoriation- none. Thickened skin on back ? Lipodystrophy? Brother is similar. Lymphadenopathy- none Head- atraumatic            Eyes- Gross vision intact, PERRLA, conjunctivae and secretions clear            Ears- Hearing, canals-normal            Nose- Clear, no-Septal dev, mucus, polyps, erosion, perforation             Throat- Mallampati IV , mucosa clear , drainage- none, tonsils- atrophic, +missing teeth Neck- flexible , trachea midline, no stridor , thyroid nl, carotid no bruit Chest - symmetrical excursion , unlabored           Heart/CV- RRR , no murmur , no gallop  , no rub, nl s1 s2                           -  JVD- none , edema- none, stasis changes- none, varices- none           Lung- clear to P&A, wheeze- none, cough- none , dullness-none, rub- none           Chest wall- sternotomy scar Abd-  Br/ Gen/ Rectal- Not done, not indicated Extrem- cyanosis- none, clubbing, none, atrophy- none, strength- nl. Vascular graft donor site scar left forearm. Neuro- grossly intact to observation

## 2011-10-23 NOTE — Patient Instructions (Addendum)
Order- DME Gaspar Garbe   On room air/ off CPAP ( At [address] [phone], [place])   Dx OSA  Flu vax  Refill script for ventolin rescue inhaler sent

## 2011-10-29 ENCOUNTER — Ambulatory Visit (HOSPITAL_COMMUNITY): Payer: Medicare Other | Attending: Cardiology | Admitting: Radiology

## 2011-10-29 VITALS — BP 110/72 | Ht 71.0 in | Wt 277.0 lb

## 2011-10-29 DIAGNOSIS — R079 Chest pain, unspecified: Secondary | ICD-10-CM

## 2011-10-29 DIAGNOSIS — E119 Type 2 diabetes mellitus without complications: Secondary | ICD-10-CM | POA: Insufficient documentation

## 2011-10-29 DIAGNOSIS — I4510 Unspecified right bundle-branch block: Secondary | ICD-10-CM | POA: Insufficient documentation

## 2011-10-29 DIAGNOSIS — I10 Essential (primary) hypertension: Secondary | ICD-10-CM | POA: Insufficient documentation

## 2011-10-29 DIAGNOSIS — I739 Peripheral vascular disease, unspecified: Secondary | ICD-10-CM | POA: Insufficient documentation

## 2011-10-29 DIAGNOSIS — I2510 Atherosclerotic heart disease of native coronary artery without angina pectoris: Secondary | ICD-10-CM

## 2011-10-29 MED ORDER — TECHNETIUM TC 99M SESTAMIBI GENERIC - CARDIOLITE
10.0000 | Freq: Once | INTRAVENOUS | Status: AC | PRN
  Administered 2011-10-29: 10 via INTRAVENOUS

## 2011-10-29 MED ORDER — TECHNETIUM TC 99M SESTAMIBI GENERIC - CARDIOLITE
30.0000 | Freq: Once | INTRAVENOUS | Status: AC | PRN
  Administered 2011-10-29: 30 via INTRAVENOUS

## 2011-10-29 MED ORDER — REGADENOSON 0.4 MG/5ML IV SOLN
0.4000 mg | Freq: Once | INTRAVENOUS | Status: AC
  Administered 2011-10-29: 0.4 mg via INTRAVENOUS

## 2011-10-29 NOTE — Progress Notes (Signed)
[facility] SITE 3 NUCLEAR MED [address] [place] [address] [phone]  Cardiology Nuclear Med Study  [name] [name] is a 56 y.o. male     MRN : [id number]     DOB: August 12, 1955  Procedure Date: 10/29/2011  Nuclear Med Background Indication for Stress Test:  Evaluation for Ischemia and Graft Patency History:  '05 MI-Heart Cath-CABGx5 08/13/10 ECHO: EF: 50-55% Cardiac Risk Factors: History of Smoking, Hypertension, IDDM Type 2, Lipids, PVD and RBBB  Symptoms:  Chest Pain   Nuclear Pre-Procedure Caffeine/Decaff Intake:  None > 12 hrs NPO After: 11:00pm   Lungs:  clear O2 Sat: 96% on room air. IV 0.9% NS with Angio Cath:  22g  IV Site: R Antecubital x 1, tolerated well IV Started by:  Irean Hong, RN  Chest Size (in):  46 Cup Size: n/a  Height: 5\' 11"  (1.803 m)  Weight:  277 lb (125.646 kg)  BMI:  Body mass index is 38.63 kg/(m^2). Tech Comments:  FBS was 161 at 7:00am, no insulin today.Took metoprolol this am    Nuclear Med Study 1 or 2 day study: 1 day  Stress Test Type:  Treadmill/Lexiscan  Reading MD: Marca Ancona, MD  Order Authorizing Provider:  Rollene Rotunda, MD  Resting Radionuclide: Technetium 86m Sestamibi  Resting Radionuclide Dose: 11.0 mCi   Stress Radionuclide:  Technetium 48m Sestamibi  Stress Radionuclide Dose: 33.0 mCi           Stress Protocol Rest HR: 69 Stress HR: 100  Rest BP: 110/72 Stress BP: 147/70  Exercise Time (min): n/a METS: n/a   Predicted Max HR: 163 bpm % Max HR: 61.35 bpm Rate Pressure Product: 21308   Dose of Adenosine (mg):  n/a Dose of Lexiscan: 0.4 mg  Dose of Atropine (mg): n/a Dose of Dobutamine: n/a mcg/kg/min (at max HR)  Stress Test Technologist: Milana Na, EMT-P  Nuclear Technologist:  Domenic Polite, CNMT     Rest Procedure:  Myocardial perfusion imaging was performed at rest 45 minutes following the intravenous administration of Technetium 69m Sestamibi. Rest ECG: NSR-RBBB  Stress  Procedure:  The patient received IV Lexiscan 0.4 mg over 15-seconds with concurrent low level exercise and then Technetium 39m Sestamibi was injected at 30-seconds while the patient continued walking one more minute. There were no significant changes with Lexiscan. Quantitative spect images were obtained after a 45-minute delay. Stress ECG: No significant change from baseline ECG  QPS Raw Data Images:  Normal; no motion artifact; normal heart/lung ratio. Stress Images:  Normal homogeneous uptake in all areas of the myocardium. Rest Images:  Normal homogeneous uptake in all areas of the myocardium. Subtraction (SDS):  There is no evidence of scar or ischemia. Transient Ischemic Dilatation (Normal <1.22):  0.91 Lung/Heart Ratio (Normal <0.45):  0,40  Quantitative Gated Spect Images QGS EDV:  100 ml QGS ESV:  42 ml  Impression Exercise Capacity:  Lexiscan with no exercise. BP Response:  Normal blood pressure response. Clinical Symptoms:  Short of breath, weird. ECG Impression:  RBBB, no change with infusion.  Comparison with Prior Nuclear Study: No images to compare  Overall Impression:  Normal stress nuclear study.  LV Ejection Fraction: 58%.  LV Wall Motion:  septal bounce noted consistent with prior CABG.   Marca Ancona 10/29/2011

## 2011-10-31 NOTE — Assessment & Plan Note (Signed)
Plan-refill Ventolin, flu vaccine

## 2011-10-31 NOTE — Assessment & Plan Note (Signed)
He is staying at his brothers home for another month and discontinued oxygen and CPAP until reestablished at the new address. I educated him and his wife about obstructive sleep apnea and cardiac disease in the importance of compliance with treatment. We can at least assess overnight oximetry on room air. Plan-overnight oximetry on room air

## 2011-11-25 ENCOUNTER — Encounter: Payer: Self-pay | Admitting: Internal Medicine

## 2012-04-01 ENCOUNTER — Other Ambulatory Visit: Payer: Self-pay | Admitting: Cardiology

## 2012-04-09 ENCOUNTER — Other Ambulatory Visit: Payer: Self-pay

## 2012-04-09 MED ORDER — EPLERENONE 25 MG PO TABS: 25.0000 mg | ORAL_TABLET | Freq: Every day | ORAL | Status: DC

## 2012-04-09 NOTE — Telephone Encounter (Signed)
Patient called in to request refill for Inspra 25 mg be sent to [facility]. Refilled 90 R-1

## 2012-04-21 ENCOUNTER — Telehealth: Payer: Self-pay | Admitting: Internal Medicine

## 2012-04-21 NOTE — Telephone Encounter (Signed)
[facility]-was hung up on several times; Rx's on CY's cart to have CY sign and Ill fax back in the morning.

## 2012-04-22 ENCOUNTER — Ambulatory Visit: Payer: Medicare Other | Admitting: Internal Medicine

## 2012-04-22 NOTE — Telephone Encounter (Signed)
[facility] aware rx will be faxed today. Carron Curie, CMA

## 2012-05-17 ENCOUNTER — Telehealth: Payer: Self-pay | Admitting: Internal Medicine

## 2012-05-17 MED ORDER — FLUTICASONE PROPIONATE 50 MCG/ACT NA SUSP: NASAL | Status: DC

## 2012-05-17 NOTE — Telephone Encounter (Signed)
Pt seen 10-2011 and due for appt April 21,2014; pt aware that Rx has been sent and to keep appt for more refills. Nothing more needed at this time.

## 2012-05-17 NOTE — Telephone Encounter (Signed)
lmomtcb x1 

## 2012-05-31 ENCOUNTER — Ambulatory Visit: Payer: Medicare Other | Admitting: Internal Medicine

## 2012-06-11 ENCOUNTER — Ambulatory Visit: Payer: Medicare Other | Admitting: Internal Medicine

## 2012-09-23 ENCOUNTER — Other Ambulatory Visit: Payer: Self-pay | Admitting: Cardiology

## 2012-09-24 ENCOUNTER — Telehealth: Payer: Self-pay | Admitting: Cardiology

## 2012-09-24 NOTE — Telephone Encounter (Signed)
Pt requesting refill of Inspra at [facility]

## 2012-09-29 ENCOUNTER — Other Ambulatory Visit: Payer: Self-pay

## 2012-09-29 MED ORDER — EPLERENONE 25 MG PO TABS: ORAL_TABLET | ORAL | Status: DC

## 2012-10-26 ENCOUNTER — Encounter: Payer: Self-pay | Admitting: Cardiology

## 2012-10-26 ENCOUNTER — Ambulatory Visit (INDEPENDENT_AMBULATORY_CARE_PROVIDER_SITE_OTHER): Payer: Medicare Other | Admitting: Cardiology

## 2012-10-26 VITALS — BP 130/78 | HR 76 | Ht 71.0 in | Wt 291.1 lb

## 2012-10-26 DIAGNOSIS — I739 Peripheral vascular disease, unspecified: Secondary | ICD-10-CM

## 2012-10-26 DIAGNOSIS — I2510 Atherosclerotic heart disease of native coronary artery without angina pectoris: Secondary | ICD-10-CM

## 2012-10-26 DIAGNOSIS — IMO0002 Reserved for concepts with insufficient information to code with codable children: Secondary | ICD-10-CM

## 2012-10-26 MED ORDER — TORSEMIDE 20 MG PO TABS: 20.0000 mg | ORAL_TABLET | Freq: Two times a day (BID) | ORAL | Status: DC

## 2012-10-26 MED ORDER — POTASSIUM CHLORIDE CRYS ER 20 MEQ PO TBCR: 20.0000 meq | EXTENDED_RELEASE_TABLET | Freq: Every day | ORAL | Status: DC

## 2012-10-26 NOTE — Progress Notes (Signed)
HPI The patient presents for follow up of known CAD. Last year when I saw him he did have some chest discomfort.  I sent him for a stress perfusion study which demonstrated no evidence of ischemia or infarct.  Since I last saw him he has had no further chest discomfort. Unfortunately he's gained about 14 pounds. I had a conversation with him about diet and clearly is not following any dietary restrictions and doesn't watch salt. He does have some increased lower extremity swelling. He denies any new PND or orthopnea. He's not having any chest pressure, neck or arm discomfort. He's not having any palpitations, presyncope or syncope.   Allergies  Allergen Reactions  . Ibuprofen     Current Outpatient Prescriptions  Medication Sig Dispense Refill  . albuterol (VENTOLIN HFA) 108 (90 BASE) MCG/ACT inhaler Inhale 2 puffs into the lungs every 4 (four) hours as needed for wheezing or shortness of breath.  1 Inhaler  prn  . ALPRAZolam (XANAX) 0.25 MG tablet Take 1 tablet (0.25 mg total) by mouth 3 (three) times daily as needed for sleep (for anxiety).  20 tablet  0  . aspirin EC 325 MG tablet Take 325 mg by mouth daily.        Marland Kitchen eplerenone (INSPRA) 25 MG tablet take 1 tablet by mouth once daily  60 tablet  0  . Febuxostat (ULORIC) 80 MG TABS Take by mouth.        . fluticasone (FLONASE) 50 MCG/ACT nasal spray 1-2 puffs into each nostril, every night at bedtime  16 g  0  . furosemide (LASIX) 20 MG tablet Take 20 mg by mouth 2 (two) times daily.        . insulin glargine (LANTUS) 100 UNIT/ML injection Inject into the skin as directed.        . insulin lispro protamine-insulin lispro (HUMALOG MIX 75/25) (75-25) 100 UNIT/ML SUSP Inject into the skin as directed.        Marland Kitchen losartan (COZAAR) 50 MG tablet Take 1 tablet (50 mg total) by mouth daily.  90 tablet  3  . metoprolol (LOPRESSOR) 50 MG tablet take 1 tablet by mouth once daily  90 tablet  3  . nitroGLYCERIN (NITROSTAT) 0.4 MG SL tablet Place 1 tablet  (0.4 mg total) under the tongue every 5 (five) minutes as needed.  25 tablet  3  . rosuvastatin (CRESTOR) 40 MG tablet Take 40 mg by mouth daily.      . [DISCONTINUED] simvastatin (ZOCOR) 80 MG tablet Take 80 mg by mouth daily.         No current facility-administered medications for this visit.    Past Medical History  Diagnosis Date  . CAD (coronary artery disease)     status post CABG in 2005 w LIMA to LAD, left radial to second circumflex marginal, saphenous vein graft to PDA, saphenous vein graft to lateral subbrach of ramus intermediate, and sequential saphenous vein graft to the medial subbranch of ramus intermediate  . Acute pancreatitis   . Cirrhosis of liver without mention of alcohol   . Type II or unspecified type diabetes mellitus without mention of complication, not stated as uncontrolled   . Bronchitis, not specified as acute or chronic   . Other and unspecified hyperlipidemia   . Unspecified sleep apnea   . Acute myocardial infarction, unspecified site, episode of care unspecified   . Complications affecting other specified body systems, hypertension     Past Surgical History  Procedure  Laterality Date  . Ent surgery    . Coronary artery bypass graft      ROS:  As stated in the HPI and negative for all other systems.  PHYSICAL EXAM BP 130/78  Pulse 76  Ht 5\' 11"  (1.803 m)  Wt 291 lb 1.9 oz (132.051 kg)  BMI 40.62 kg/m2  SpO2 97% GENERAL:  Well appearing HEENT:  Pupils equal round and reactive, fundi not visualized, oral mucosa unremarkable, poor dentition.   NECK:  No jugular venous distention, waveform within normal limits, carotid upstroke brisk and symmetric, no bruits, no thyromegaly LYMPHATICS:  No cervical, inguinal adenopathy LUNGS:  Clear to auscultation bilaterally BACK:  No CVA tenderness CHEST:  Well healed sternotomy scar. HEART:  PMI not displaced or sustained,S1 and S2 within normal limits, no S3, no S4, no clicks, no rubs, no murmurs ABD:   Flat, positive bowel sounds normal in frequency in pitch, no bruits, no rebound, no guarding, no midline pulsatile mass, no hepatomegaly, no splenomegaly, obese, umbilical hernia EXT:  2 plus pulses throughout, trace edema, no cyanosis no clubbing, right radial scar SKIN:  No rashes no nodules, tense edema NEURO:  Cranial nerves II through XII grossly intact, motor grossly intact throughout PSYCH:  Cognitively intact, oriented to person place and time   EKG:  Normal sinus rhythm, rate 74, right bundle branch block, left atrial enlargement, QTC prolonged, no acute ST-T wave changes.  10/26/2012  ASSESSMENT AND PLAN  CAD -  The patient has no new sypmtoms.  No further cardiovascular testing is indicated.  We will continue with aggressive risk reduction and meds as listed.   OBESITY, UNSPECIFIED -  We again had a long discussion about the need to change his diet. He is not at all taking care of himself.   Edema -  With his increased edema I will switch him to Demadex 20 mg daily. I will add potassium. He will have a basic metabolic profile in 7 days.   Hyperlipidemia - He is followed by endocrinology for management of this and his diabetes. I will defer to their management.   HYPERTENSION NEC -  The blood pressure is at target. No change in medications is indicated. We will continue with therapeutic lifestyle changes (TLC).

## 2012-10-26 NOTE — Patient Instructions (Addendum)
Please stop Furosemide Start Demadex 20 mg twice a day and potassium chloride 20 MEQ a day Continue all other medications as listed.  Return in 1 week for blood work (BMP)  Follow up in 1 year with Dr Antoine Poche.  You will receive a letter in the mail 2 months before you are due.  Please call us when you receive this letter to schedule your follow up appointment.

## 2012-11-01 ENCOUNTER — Other Ambulatory Visit (INDEPENDENT_AMBULATORY_CARE_PROVIDER_SITE_OTHER): Payer: Medicare Other

## 2012-11-01 DIAGNOSIS — I2510 Atherosclerotic heart disease of native coronary artery without angina pectoris: Secondary | ICD-10-CM

## 2012-11-01 DIAGNOSIS — IMO0002 Reserved for concepts with insufficient information to code with codable children: Secondary | ICD-10-CM

## 2012-11-02 ENCOUNTER — Other Ambulatory Visit: Payer: Medicare Other

## 2012-11-02 LAB — BASIC METABOLIC PANEL
BUN: 15 mg/dL (ref 6–23)
CO2: 25 mEq/L (ref 19–32)
Chloride: 100 mEq/L (ref 96–112)
Glucose, Bld: 353 mg/dL — ABNORMAL HIGH (ref 70–99)
Potassium: 3.7 mEq/L (ref 3.5–5.1)

## 2013-03-04 ENCOUNTER — Telehealth: Payer: Self-pay | Admitting: Cardiology

## 2013-03-04 NOTE — Telephone Encounter (Signed)
New Message  Pt called states that his throat is swollen and is requesting a script for an antibiotic. Please call

## 2013-03-04 NOTE — Telephone Encounter (Signed)
Pt aware to call PCP

## 2013-03-31 ENCOUNTER — Telehealth: Payer: Self-pay | Admitting: Internal Medicine

## 2013-03-31 MED ORDER — IPRATROPIUM BROMIDE 0.02 % IN SOLN: 0.5000 mg | Freq: Four times a day (QID) | RESPIRATORY_TRACT | Status: DC | PRN

## 2013-03-31 MED ORDER — ALBUTEROL SULFATE (2.5 MG/3ML) 0.083% IN NEBU: 2.5000 mg | INHALATION_SOLUTION | Freq: Four times a day (QID) | RESPIRATORY_TRACT | Status: DC | PRN

## 2013-03-31 NOTE — Telephone Encounter (Signed)
Called and spoke with Caren Griffins. She reports she has RX's on pt for ipratropium and albuterol nebs. They have PRN directions. Medicare will no longer cover directions to state PRN. She has refaxed over RX's she has on file. I do not even see either of these medications on his list. Please advise CDY thanks

## 2013-03-31 NOTE — Telephone Encounter (Signed)
i think we can give an interval- like every 6 hours, if needed, and still be ok. We just can't just say take prn.

## 2013-03-31 NOTE — Telephone Encounter (Signed)
OK- Rx was sent to the pt's pharmacy  Pt aware that he must keep appt for additional refills

## 2013-03-31 NOTE — Telephone Encounter (Signed)
We last saw him in 2013 and do not show duoneb/ ipratropium + albuterol on his med list.  We can send Rx for albuterol 0.083 # 75, 1 every 6 hours as needed, and ipratropium 0.5 mg # 75 to take 1 of each by neb, every 6 hours if needed, ref x 3  He needs to make [place] appointment.

## 2013-03-31 NOTE — Telephone Encounter (Signed)
I called and spoke with the pt and made him an appt for March  However, the note below states that meds will not be covered when written to take PRN  Please advise thanks!

## 2013-04-22 ENCOUNTER — Telehealth: Payer: Self-pay | Admitting: Internal Medicine

## 2013-04-22 NOTE — Telephone Encounter (Signed)
Rx cannot say as needed. Phone note on 2-19 discuss this as well. Per CY ok to say every 6 hours and leave off the as needed part. Pharmacy is aware. Bayonne Bing, CMA

## 2013-04-29 ENCOUNTER — Ambulatory Visit (INDEPENDENT_AMBULATORY_CARE_PROVIDER_SITE_OTHER): Payer: Medicare Other | Admitting: Internal Medicine

## 2013-04-29 ENCOUNTER — Encounter: Payer: Self-pay | Admitting: Internal Medicine

## 2013-04-29 VITALS — BP 130/80 | HR 72 | Ht 71.0 in | Wt 290.2 lb

## 2013-04-29 DIAGNOSIS — G4733 Obstructive sleep apnea (adult) (pediatric): Secondary | ICD-10-CM

## 2013-04-29 DIAGNOSIS — E669 Obesity, unspecified: Secondary | ICD-10-CM

## 2013-04-29 DIAGNOSIS — J40 Bronchitis, not specified as acute or chronic: Secondary | ICD-10-CM

## 2013-04-29 MED ORDER — ALBUTEROL SULFATE HFA 108 (90 BASE) MCG/ACT IN AERS: 2.0000 | INHALATION_SPRAY | RESPIRATORY_TRACT | Status: DC | PRN

## 2013-04-29 MED ORDER — IPRATROPIUM BROMIDE 0.02 % IN SOLN: 0.5000 mg | Freq: Four times a day (QID) | RESPIRATORY_TRACT | Status: DC | PRN

## 2013-04-29 MED ORDER — ALPRAZOLAM 0.25 MG PO TABS: 0.2500 mg | ORAL_TABLET | Freq: Three times a day (TID) | ORAL | Status: DC | PRN

## 2013-04-29 MED ORDER — ALBUTEROL SULFATE (2.5 MG/3ML) 0.083% IN NEBU: 2.5000 mg | INHALATION_SOLUTION | Freq: Four times a day (QID) | RESPIRATORY_TRACT | Status: DC | PRN

## 2013-04-29 MED ORDER — FLUTICASONE PROPIONATE 50 MCG/ACT NA SUSP: NASAL | Status: DC

## 2013-04-29 NOTE — Patient Instructions (Signed)
Meds refilled  Order- schedule split protocol NPSG     Dx OSA   Please call as needed

## 2013-04-29 NOTE — Progress Notes (Signed)
05/06/11- 49 yoM former smoker with sleep apnea, bronchitis, SAR, CAD/MI/CABG LOV-10/20/08  He comes for recheck. Has not worn CPAP since MI/CABG. Thinks he is breathing well. Does admit he needs to restart CPAP. Daytime sleepiness, snoring, aware he stops breathing at night.  Occasional cough with cold. Does get "congested" with a little wheeze and he coughs with grass mowing. Still has a nebulizer machine which helps.  06/19/11- 43 yoM former smoker with sleep apnea, bronchitis, SAR, CAD/MI/CABG Not wearing CPAP at this time-getting with [employer] to check pressure to restart; However he is still using O2; review PFT with patient. Has not used CPAP since his CABG in 2005, planning autotitration with his DME company/ [employer] to restart as discussed previously. Continues oxygen 2 L for sleep/ [employer]. PFT 06/18/2011-moderate restriction. Flows are normal for measure volume with FEV1/FVC 0.84 and insignificant response to bronchodilator. DLCO 52%.  10/23/11- 21 yoM former smoker with sleep apnea, bronchitis, SAR, CAD/MI/CABG    wife here Not using CPAP (due to recent moved) and has not used O2 in about 1 month. They will be in their new home until next month. Wife confirms that he is snoring now without his CPAP. He is pending a cardiac stress test and I again explained concerns of untreated sleep apnea and hypoxemia with background cardiac disease. He feels well. Occasional use of rescue inhaler with little wheeze or cough. CXR 06/26/11-  IMPRESSION:  Stable cardiac enlargement.  Original Report Authenticated By: Angelita Ingles, M.D.   04/29/13- 19 yoM former smoker with sleep apnea, bronchitis, SAR, CAD/MI/CABG    wife here FOLLOWS FOR: has not used CPAP since 2005 after his open heart surgery. Wife reports loud snoring. No routine breathing problems, cough or wheeze. No recent cardiac event. Does not eat rescue inhaler and is not using home oxygen/[employer]. When he tried using CPAP after his heart  surgery, the pressure hurt his sternum.  ROS-see HPI Constitutional:   No-   weight loss, night sweats, fevers, chills, fatigue, lassitude. HEENT:   No-  headaches, difficulty swallowing, tooth/dental problems, sore throat,       No-  sneezing, itching, ear ache, nasal congestion, post nasal drip,  CV:  No-   chest pain, orthopnea, PND, swelling in lower extremities, anasarca,  dizziness, palpitations Resp: no-shortness of breath with exertion or at rest.              No-   productive cough,  No non-productive cough,  No- coughing up of blood.              No-   change in color of mucus.  + Occasional wheezing.   Skin: No-   rash or lesions. GI:  No-   heartburn, indigestion, abdominal pain, nausea, vomiting,  GU: . MS:  No-   joint pain or swelling.   Neuro-     nothing unusual Psych:  No- change in mood or affect. No depression or anxiety.  No memory loss.   OBJ- Physical Exam General- Alert, Oriented, Affect-appropriate, Distress- none acute, obese Skin- rash-none, lesions- none, excoriation- none. Thickened skin on back ? Lipodystrophy? Brother is similar. Lymphadenopathy- none Head- atraumatic            Eyes- Gross vision intact, PERRLA, conjunctivae and secretions clear            Ears- Hearing, canals-normal            Nose- Clear, no-Septal dev, mucus, polyps, erosion, perforation  Throat- Mallampati IV , mucosa clear , drainage- none, tonsils- atrophic, +missing teeth Neck- flexible , trachea midline, no stridor , thyroid nl, carotid no bruit Chest - symmetrical excursion , unlabored           Heart/CV- RRR , no murmur , no gallop  , no rub, nl s1 s2                           - JVD- none , edema- none, stasis changes- none, varices- none           Lung- clear to P&A, wheeze- none, cough- none , dullness-none, rub- none           Chest wall- sternotomy scar Abd-  Br/ Gen/ Rectal- Not done, not indicated Extrem- cyanosis- none, clubbing, none, atrophy- none,  strength- nl. Vascular graft donor site scar left forearm. Neuro- grossly intact to observation

## 2013-05-09 ENCOUNTER — Other Ambulatory Visit: Payer: Self-pay | Admitting: Cardiology

## 2013-05-11 ENCOUNTER — Encounter (HOSPITAL_BASED_OUTPATIENT_CLINIC_OR_DEPARTMENT_OTHER): Payer: Medicare Other

## 2013-05-20 NOTE — Assessment & Plan Note (Signed)
Plan-refill nebulizer solution. watch need to resume oxygen during sleep

## 2013-05-20 NOTE — Assessment & Plan Note (Signed)
Weight loss discussed and encouraged

## 2013-05-20 NOTE — Assessment & Plan Note (Signed)
We discussed sleep apnea and CPAP. Recognized need to update sleep study. Plan- schedule sleep study to reestablish diagnosis

## 2013-06-09 ENCOUNTER — Encounter (HOSPITAL_BASED_OUTPATIENT_CLINIC_OR_DEPARTMENT_OTHER): Payer: Medicare Other

## 2013-06-22 ENCOUNTER — Other Ambulatory Visit: Payer: Self-pay | Admitting: Orthopaedic Surgery

## 2013-06-22 DIAGNOSIS — R221 Localized swelling, mass and lump, neck: Secondary | ICD-10-CM

## 2013-06-22 DIAGNOSIS — M5104 Intervertebral disc disorders with myelopathy, thoracic region: Secondary | ICD-10-CM

## 2013-06-22 DIAGNOSIS — M542 Cervicalgia: Secondary | ICD-10-CM

## 2013-06-26 ENCOUNTER — Encounter (HOSPITAL_BASED_OUTPATIENT_CLINIC_OR_DEPARTMENT_OTHER): Payer: Medicare Other

## 2013-06-29 ENCOUNTER — Other Ambulatory Visit: Payer: Medicare Other

## 2013-06-30 ENCOUNTER — Ambulatory Visit: Payer: Medicare Other | Admitting: Internal Medicine

## 2013-07-05 ENCOUNTER — Telehealth: Payer: Self-pay | Admitting: Internal Medicine

## 2013-07-05 ENCOUNTER — Inpatient Hospital Stay
Admission: RE | Admit: 2013-07-05 | Discharge: 2013-07-05 | Disposition: A | Discharge status: Home / Self Care | Payer: Medicare Other | Source: Ambulatory Visit | Attending: Orthopaedic Surgery | Admitting: Orthopaedic Surgery

## 2013-07-05 ENCOUNTER — Other Ambulatory Visit: Payer: Medicare Other

## 2013-07-05 NOTE — Discharge Instructions (Signed)

## 2013-07-05 NOTE — Telephone Encounter (Signed)
The Rx has been received for CY. This has been signed and faxed back to [facility] [phone]. I attempted to call pharmacy multiple times and was hung up on. I will leave for Triage to attempt to call back in the AM so they are aware.

## 2013-07-06 NOTE — Telephone Encounter (Signed)
lmtcb x1 

## 2013-07-07 NOTE — Telephone Encounter (Signed)
Called and spoke with [facility] and they did receive the rx and they are waiting on the pharmacists to review the order for the meds.  Nothing further is needed.

## 2013-08-17 ENCOUNTER — Other Ambulatory Visit: Payer: Self-pay | Admitting: *Deleted

## 2013-08-17 MED ORDER — NITROGLYCERIN 0.4 MG SL SUBL: 0.4000 mg | SUBLINGUAL_TABLET | SUBLINGUAL | Status: DC | PRN

## 2013-09-16 IMAGING — CR DG CHEST 2V
2 series · 2 of 2 positions shown · non-contrast
Comparison: 04/05/2009

CLINICAL DATA: Chest tightness

CHEST - 2 VIEW

[view not recorded (1 of 2)]
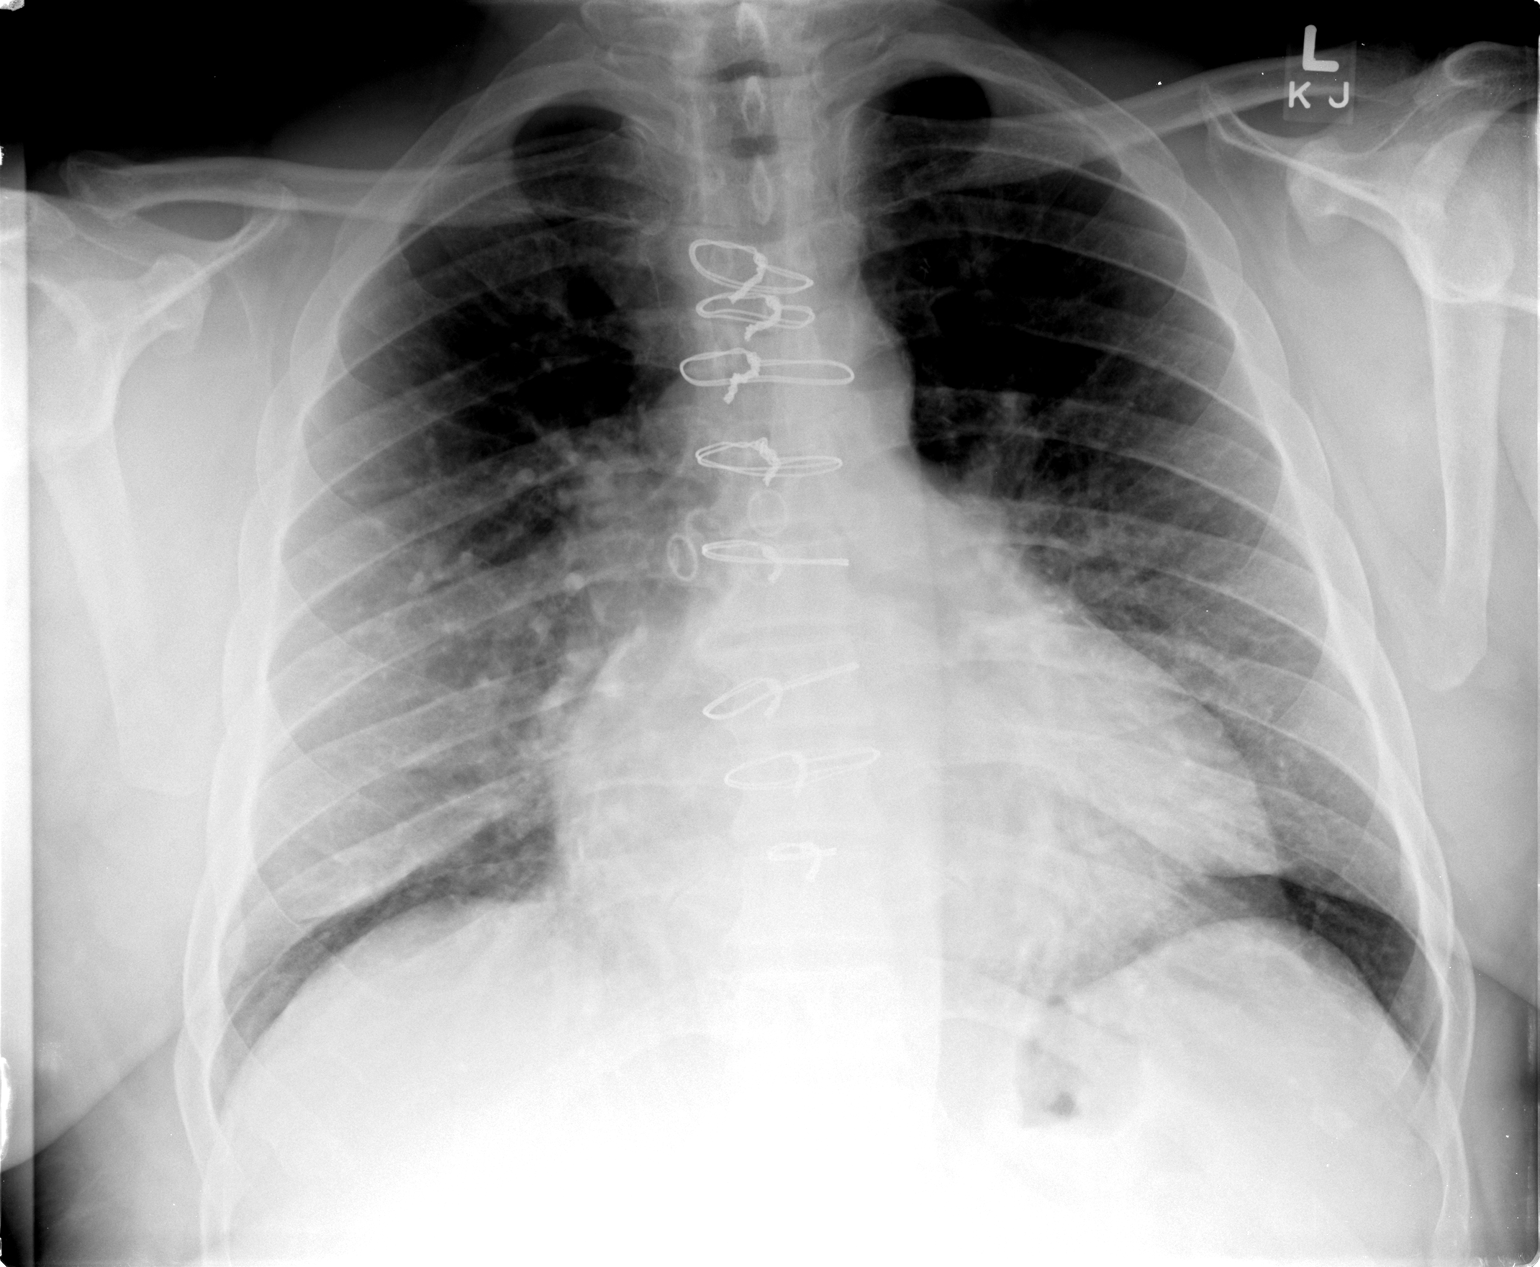

[view not recorded (2 of 2)]
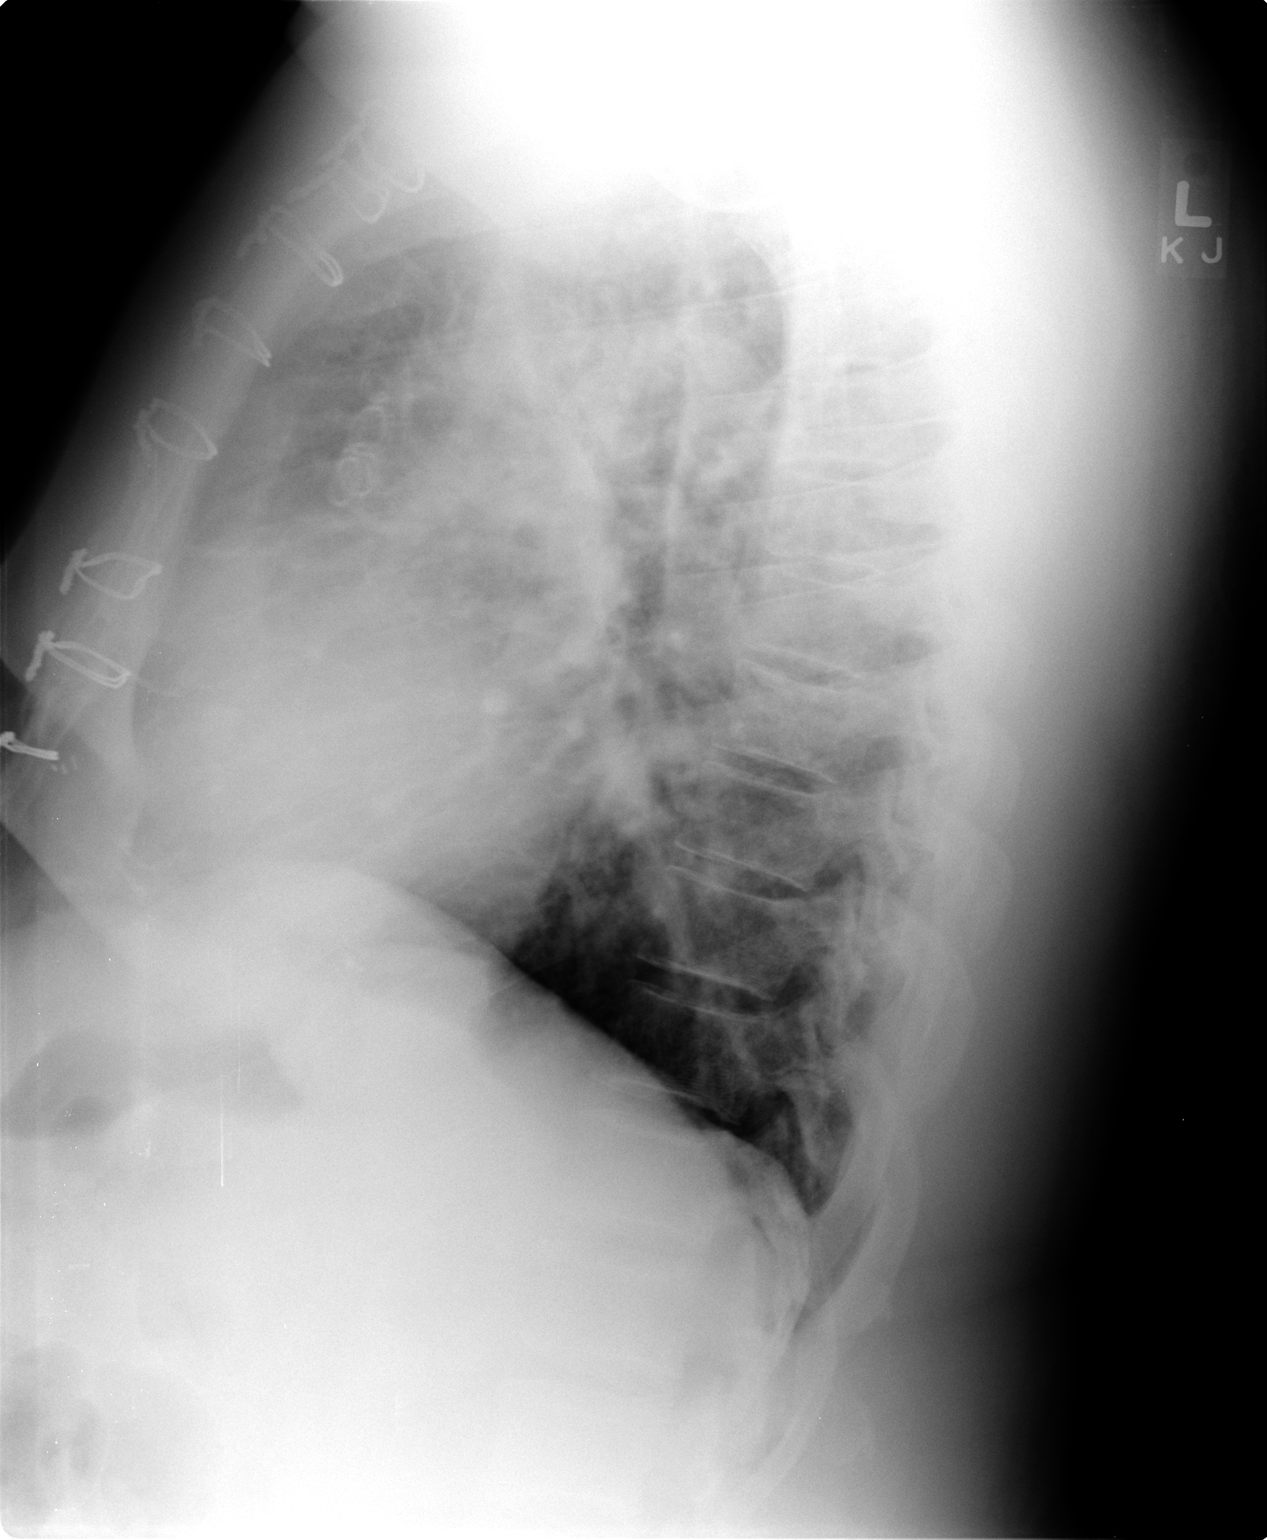

[2 of 2 positions shown; findings below may reference images not displayed]

FINDINGS: Prior median sternotomy and CABG procedure.

Stable moderate cardiac enlargement.

No pleural effusion or edema.

No airspace consolidation.
IMPRESSION: Stable cardiac enlargement.

## 2013-10-27 ENCOUNTER — Encounter: Payer: Self-pay | Admitting: Cardiology

## 2013-10-27 ENCOUNTER — Ambulatory Visit (INDEPENDENT_AMBULATORY_CARE_PROVIDER_SITE_OTHER): Payer: Medicare Other | Admitting: Cardiology

## 2013-10-27 VITALS — BP 116/70 | HR 77 | Ht 71.0 in | Wt 285.7 lb

## 2013-10-27 DIAGNOSIS — I2510 Atherosclerotic heart disease of native coronary artery without angina pectoris: Secondary | ICD-10-CM

## 2013-10-27 DIAGNOSIS — I2583 Coronary atherosclerosis due to lipid rich plaque: Principal | ICD-10-CM

## 2013-10-27 DIAGNOSIS — I739 Peripheral vascular disease, unspecified: Secondary | ICD-10-CM

## 2013-10-27 NOTE — Progress Notes (Signed)
HPI The patient presents for follow up of known CAD.  Since I last saw him he has had some chest pain.  This is sporadic.  He thinks that it is similar to reflux but is not sure that it is not similar to his previous reflux.  He does also describe some left leg pain which limits his ambulation.  He reports that he watches his diet more.  However, he is not exercising.  He does have left greater than right lower extremity swelling that did improve with demadex.   He does have some increased lower extremity swelling. He denies any new PND or orthopnea. . He's not having any palpitations, presyncope or syncope.   Allergies  Allergen Reactions  . Ibuprofen     Current Outpatient Prescriptions  Medication Sig Dispense Refill  . albuterol (PROVENTIL) (2.5 MG/3ML) 0.083% nebulizer solution Take 3 mLs (2.5 mg total) by nebulization every 6 (six) hours as needed for wheezing or shortness of breath.  75 mL  prn  . albuterol (VENTOLIN HFA) 108 (90 BASE) MCG/ACT inhaler Inhale 2 puffs into the lungs every 4 (four) hours as needed for wheezing or shortness of breath.  1 Inhaler  prn  . ALPRAZolam (XANAX) 0.25 MG tablet Take 1 tablet (0.25 mg total) by mouth 3 (three) times daily as needed for sleep (for anxiety).  20 tablet  5  . aspirin EC 325 MG tablet Take 325 mg by mouth daily.        Marland Kitchen eplerenone (INSPRA) 25 MG tablet take 1 tablet by mouth once daily  90 tablet  1  . Febuxostat (ULORIC) 80 MG TABS Take by mouth.        . fluticasone (FLONASE) 50 MCG/ACT nasal spray 1-2 puffs into each nostril, every night at bedtime  16 g  prn  . insulin glargine (LANTUS) 100 UNIT/ML injection Inject into the skin as directed. 60-70 units every morning      . insulin lispro protamine-insulin lispro (HUMALOG MIX 75/25) (75-25) 100 UNIT/ML SUSP Inject into the skin as directed.        Marland Kitchen ipratropium (ATROVENT) 0.02 % nebulizer solution Take 2.5 mLs (0.5 mg total) by nebulization every 6 (six) hours as needed for  wheezing or shortness of breath.  75 mL  prn  . JARDIANCE 25 MG TABS Take 1 tablet by mouth daily.      Marland Kitchen losartan (COZAAR) 50 MG tablet Take 25 mg by mouth daily.      . metoprolol (LOPRESSOR) 50 MG tablet take 1 tablet by mouth once daily  90 tablet  3  . nitroGLYCERIN (NITROSTAT) 0.4 MG SL tablet Place 1 tablet (0.4 mg total) under the tongue every 5 (five) minutes as needed.  25 tablet  3  . potassium chloride SA (K-DUR,KLOR-CON) 20 MEQ tablet Take 1 tablet (20 mEq total) by mouth daily.  30 tablet  11  . rosuvastatin (CRESTOR) 40 MG tablet Take 40 mg by mouth daily.      Marland Kitchen torsemide (DEMADEX) 20 MG tablet Take 1 tablet (20 mg total) by mouth 2 (two) times daily.  60 tablet  11  . [DISCONTINUED] simvastatin (ZOCOR) 80 MG tablet Take 80 mg by mouth daily.         No current facility-administered medications for this visit.    Past Medical History  Diagnosis Date  . CAD (coronary artery disease)     status post CABG in 2005 w LIMA to LAD, left radial to second  circumflex marginal, saphenous vein graft to PDA, saphenous vein graft to lateral subbrach of ramus intermediate, and sequential saphenous vein graft to the medial subbranch of ramus intermediate  . Acute pancreatitis   . Cirrhosis of liver without mention of alcohol   . Type II or unspecified type diabetes mellitus without mention of complication, not stated as uncontrolled   . Bronchitis, not specified as acute or chronic   . Other and unspecified hyperlipidemia   . Unspecified sleep apnea   . Acute myocardial infarction, unspecified site, episode of care unspecified   . Complications affecting other specified body systems, hypertension     Past Surgical History  Procedure Laterality Date  . Ent surgery    . Coronary artery bypass graft      ROS:  As stated in the HPI and negative for all other systems.  PHYSICAL EXAM BP 116/70  Pulse 77  Ht 5\' 11"  (1.803 m)  Wt 285 lb 11.2 oz (129.593 kg)  BMI 39.86 kg/m2 GENERAL:   Well appearing HEENT:  Pupils equal round and reactive, fundi not visualized, oral mucosa unremarkable, poor dentition.   NECK:  No jugular venous distention, waveform within normal limits, carotid upstroke brisk and symmetric, no bruits, no thyromegaly LYMPHATICS:  No cervical, inguinal adenopathy LUNGS:  Clear to auscultation bilaterally BACK:  No CVA tenderness CHEST:  Well healed sternotomy scar. HEART:  PMI not displaced or sustained,S1 and S2 within normal limits, no S3, no S4, no clicks, no rubs, no murmurs ABD:  Flat, positive bowel sounds normal in frequency in pitch, no bruits, no rebound, no guarding, no midline pulsatile mass, no hepatomegaly, no splenomegaly, obese, umbilical hernia EXT:  2 plus pulses throughout, left leg edema mild, no cyanosis no clubbing, right radial scar SKIN:  No rashes no nodules, tense edema NEURO:  Cranial nerves II through XII grossly intact, motor grossly intact throughout PSYCH:  Cognitively intact, oriented to person place and time   EKG:  Normal sinus rhythm, rate 77, right bundle branch block, left atrial enlargement, QTC prolonged, no acute ST-T wave changes, PACs, no acute ST T wave changes.  10/27/2013  ASSESSMENT AND PLAN  CAD -  Given his chest pain stress testing is indicated.  He would not be able to walk on a treadmill and so he will have a [employer].   OBESITY, UNSPECIFIED -  The patient understands the need to lose weight with diet and exercise. We have discussed specific strategies for this.  Edema -  Demadex 20 mg was added daily. He will continue this and we did talk about salt and fluid retriction.    PVD -  He has some foot pain.  I will check ABIs.    Hyperlipidemia - He is followed by endocrinology for management of this and his diabetes. I will defer to their management.   HYPERTENSION NEC -  The blood pressure is at target. No change in medications is indicated. We will continue with therapeutic lifestyle  changes (TLC).

## 2013-10-27 NOTE — Patient Instructions (Signed)
Your physician recommends that you schedule a follow-up appointment in:  6 months With dr. Percival Spanish  We are ordering two test for you to get done

## 2013-11-03 ENCOUNTER — Encounter (HOSPITAL_COMMUNITY): Payer: Medicare Other

## 2013-11-04 ENCOUNTER — Encounter (HOSPITAL_COMMUNITY): Payer: Medicare Other

## 2013-11-09 ENCOUNTER — Telehealth (HOSPITAL_COMMUNITY): Payer: Self-pay

## 2013-11-09 NOTE — Telephone Encounter (Signed)
Encounter complete. 

## 2013-11-10 ENCOUNTER — Inpatient Hospital Stay (HOSPITAL_COMMUNITY): Admission: RE | Admit: 2013-11-10 | Payer: Medicare Other | Source: Ambulatory Visit

## 2013-11-15 ENCOUNTER — Encounter (HOSPITAL_COMMUNITY): Payer: Medicare Other

## 2013-11-15 ENCOUNTER — Ambulatory Visit (HOSPITAL_COMMUNITY)
Admission: RE | Admit: 2013-11-15 | Discharge: 2013-11-15 | Disposition: A | Discharge status: Home / Self Care | Payer: Medicare Other | Source: Ambulatory Visit | Attending: Cardiovascular Disease | Admitting: Cardiovascular Disease

## 2013-11-15 ENCOUNTER — Telehealth (HOSPITAL_COMMUNITY): Payer: Self-pay | Admitting: *Deleted

## 2013-11-15 DIAGNOSIS — E785 Hyperlipidemia, unspecified: Secondary | ICD-10-CM | POA: Insufficient documentation

## 2013-11-15 DIAGNOSIS — I739 Peripheral vascular disease, unspecified: Secondary | ICD-10-CM | POA: Diagnosis not present

## 2013-11-15 DIAGNOSIS — J449 Chronic obstructive pulmonary disease, unspecified: Secondary | ICD-10-CM | POA: Diagnosis not present

## 2013-11-15 DIAGNOSIS — R079 Chest pain, unspecified: Secondary | ICD-10-CM | POA: Insufficient documentation

## 2013-11-15 DIAGNOSIS — I10 Essential (primary) hypertension: Secondary | ICD-10-CM | POA: Diagnosis not present

## 2013-11-15 DIAGNOSIS — E119 Type 2 diabetes mellitus without complications: Secondary | ICD-10-CM | POA: Insufficient documentation

## 2013-11-15 DIAGNOSIS — R0600 Dyspnea, unspecified: Secondary | ICD-10-CM | POA: Insufficient documentation

## 2013-11-15 DIAGNOSIS — R42 Dizziness and giddiness: Secondary | ICD-10-CM | POA: Insufficient documentation

## 2013-11-15 DIAGNOSIS — I2510 Atherosclerotic heart disease of native coronary artery without angina pectoris: Secondary | ICD-10-CM | POA: Insufficient documentation

## 2013-11-15 DIAGNOSIS — I4510 Unspecified right bundle-branch block: Secondary | ICD-10-CM | POA: Insufficient documentation

## 2013-11-15 DIAGNOSIS — I2583 Coronary atherosclerosis due to lipid rich plaque: Secondary | ICD-10-CM

## 2013-11-15 MED ORDER — TECHNETIUM TC 99M SESTAMIBI GENERIC - CARDIOLITE
30.7000 | Freq: Once | INTRAVENOUS | Status: AC | PRN
  Administered 2013-11-15: 30.7 via INTRAVENOUS

## 2013-11-15 MED ORDER — REGADENOSON 0.4 MG/5ML IV SOLN
0.4000 mg | Freq: Once | INTRAVENOUS | Status: AC
  Administered 2013-11-15: 0.4 mg via INTRAVENOUS

## 2013-11-15 MED ORDER — AMINOPHYLLINE 25 MG/ML IV SOLN
75.0000 mg | Freq: Once | INTRAVENOUS | Status: AC
  Administered 2013-11-15: 75 mg via INTRAVENOUS

## 2013-11-15 NOTE — Procedures (Addendum)
[name] [address] [place] [address] [phone]  Cardiology Nuclear Med Study  [name] [name] is a 58 y.o. male     MRN : [id number]     DOB: 01-26-1956  Procedure Date: 11/15/2013  Nuclear Med Background Indication for Stress Test:  Graft Patency and Abnormal EKG History:  COPD and CAD;MI;CABG X5-2005;SEIZURES;Last NUC MPI on 10/19/2011-nonischemic;EF=58% Cardiac Risk Factors: Family History - CAD, History of Smoking, Hypertension, IDDM Type 2, Lipids, Obesity, PVD and RBBB  Symptoms:  Chest Pain, DOE, Fatigue and Light-Headedness   Nuclear Pre-Procedure Caffeine/Decaff Intake:  1:00am NPO After: 11am   IV Site: R Hand  IV 0.9% NS with Angio Cath:  22g  Chest Size (in):  52"  IV Started by: [name] Course, RN  Height: 5\' 11"  (1.803 m)  Cup Size: n/a  BMI:  Body mass index is 39.91 kg/(m^2). Weight:  286 lb (129.729 kg)   Tech Comments:  n/a    Nuclear Med Study 1 or 2 day study: 2 day  Stress Test Type:  [place] Provider:  Benjamin [name]   Resting Radionuclide: Technetium 64m Sestamibi  Resting Radionuclide Dose: 30.1 mCi   Stress Radionuclide:  Technetium 71m Sestamibi  Stress Radionuclide Dose: 30.7 mCi           Stress Protocol Rest HR: 78 Stress HR: 91  Rest BP: 119/68 Stress BP: 129/65  Exercise Time (min): n/a METS: n/a   Predicted Max HR: 161 bpm % Max HR: 56.52 bpm Rate Pressure Product: 12922  Dose of Adenosine (mg):  n/a Dose of Lexiscan: 0.4 mg  Dose of Atropine (mg): n/a Dose of Dobutamine: n/a mcg/kg/min (at max HR)  Stress Test Technologist: [name] [name], CCT Nuclear Technologist: [name] [name], CNMT   Rest Procedure:  Myocardial perfusion imaging was performed at rest 45 minutes following the intravenous administration of Technetium 84m Sestamibi. Stress Procedure:  The patient received IV Lexiscan 0.4 mg over 15-seconds.  Technetium 63m Sestamibi injected  at 30-seconds.  Patient experienced SOB and 75 mg Aminophylline IV was administered.  There were no significant changes with Lexiscan.  Quantitative spect images were obtained after a 45 minute delay.  Transient Ischemic Dilatation (Normal <1.22):  0.60 QGS EDV:  n/a ml QGS ESV:  n/a ml LV Ejection Fraction: Study not gated       Rest ECG: NSR-RBBB  Stress ECG: No significant change from baseline ECG  QPS Raw Data Images:  Normal; no motion artifact; normal heart/lung ratio. Stress Images:  Normal homogeneous uptake in all areas of the myocardium. Rest Images:  Normal homogeneous uptake in all areas of the myocardium. Subtraction (SDS):  No evidence of ischemia.  Impression Exercise Capacity:  Lexiscan with no exercise. BP Response:  Normal blood pressure response. Clinical Symptoms:  No significant symptoms noted. ECG Impression:  No significant ST segment change suggestive of ischemia. Comparison with Prior Nuclear Study: No significant change from previous study  Overall Impression:  Normal stress nuclear study.  LV Wall Motion:  Non gated study   [name] Harp, MD  11/16/2013 3:59 PM

## 2013-11-16 ENCOUNTER — Encounter (HOSPITAL_COMMUNITY): Payer: Medicare Other

## 2013-11-16 ENCOUNTER — Ambulatory Visit (HOSPITAL_COMMUNITY)
Admission: RE | Admit: 2013-11-16 | Discharge: 2013-11-16 | Disposition: A | Discharge status: Home / Self Care | Payer: Medicare Other | Source: Ambulatory Visit | Attending: Cardiovascular Disease | Admitting: Cardiovascular Disease

## 2013-11-16 DIAGNOSIS — J449 Chronic obstructive pulmonary disease, unspecified: Secondary | ICD-10-CM | POA: Insufficient documentation

## 2013-11-16 DIAGNOSIS — Z794 Long term (current) use of insulin: Secondary | ICD-10-CM | POA: Insufficient documentation

## 2013-11-16 DIAGNOSIS — I739 Peripheral vascular disease, unspecified: Secondary | ICD-10-CM | POA: Diagnosis not present

## 2013-11-16 DIAGNOSIS — I10 Essential (primary) hypertension: Secondary | ICD-10-CM | POA: Diagnosis not present

## 2013-11-16 DIAGNOSIS — I4510 Unspecified right bundle-branch block: Secondary | ICD-10-CM | POA: Diagnosis not present

## 2013-11-16 DIAGNOSIS — E669 Obesity, unspecified: Secondary | ICD-10-CM | POA: Diagnosis not present

## 2013-11-16 DIAGNOSIS — I2510 Atherosclerotic heart disease of native coronary artery without angina pectoris: Secondary | ICD-10-CM | POA: Insufficient documentation

## 2013-11-16 DIAGNOSIS — E119 Type 2 diabetes mellitus without complications: Secondary | ICD-10-CM | POA: Diagnosis not present

## 2013-11-16 DIAGNOSIS — E785 Hyperlipidemia, unspecified: Secondary | ICD-10-CM | POA: Diagnosis not present

## 2013-11-16 DIAGNOSIS — Z8249 Family history of ischemic heart disease and other diseases of the circulatory system: Secondary | ICD-10-CM | POA: Insufficient documentation

## 2013-11-16 DIAGNOSIS — R9431 Abnormal electrocardiogram [ECG] [EKG]: Secondary | ICD-10-CM | POA: Diagnosis present

## 2013-11-16 DIAGNOSIS — Z951 Presence of aortocoronary bypass graft: Secondary | ICD-10-CM | POA: Insufficient documentation

## 2013-11-16 DIAGNOSIS — I252 Old myocardial infarction: Secondary | ICD-10-CM | POA: Diagnosis not present

## 2013-11-16 DIAGNOSIS — Z87891 Personal history of nicotine dependence: Secondary | ICD-10-CM | POA: Insufficient documentation

## 2013-11-16 MED ORDER — TECHNETIUM TC 99M SESTAMIBI GENERIC - CARDIOLITE
30.1000 | Freq: Once | INTRAVENOUS | Status: AC | PRN
  Administered 2013-11-16: 30.1 via INTRAVENOUS

## 2013-11-16 NOTE — Progress Notes (Signed)
Spoke with friend - he will have patient return call for stress test results

## 2013-11-23 ENCOUNTER — Other Ambulatory Visit: Payer: Self-pay | Admitting: Cardiology

## 2013-12-20 ENCOUNTER — Other Ambulatory Visit: Payer: Self-pay | Admitting: Cardiology

## 2013-12-26 ENCOUNTER — Other Ambulatory Visit: Payer: Self-pay | Admitting: *Deleted

## 2013-12-26 DIAGNOSIS — E878 Other disorders of electrolyte and fluid balance, not elsewhere classified: Secondary | ICD-10-CM

## 2014-02-13 ENCOUNTER — Telehealth: Payer: Self-pay | Admitting: Cardiology

## 2014-02-13 NOTE — Telephone Encounter (Signed)
Pt recently switched from Lantus to [facility] for long acting insulin coverage.  Pt having concern because he read about potential side effects of new medicine. States "feels a bit nervous" and heart felt like it was "fluttering a bit". Denies CP, SOB, BP & pulse normal when checked.  Advised to f/u w/ prescriber, he states he is seeing him today to have him switch back to Lantus.

## 2014-02-13 NOTE — Telephone Encounter (Signed)
Please call,question about his medicine(Toujeo).He have been having some side effects.

## 2014-06-13 ENCOUNTER — Other Ambulatory Visit: Payer: Self-pay | Admitting: Cardiology

## 2014-06-13 DIAGNOSIS — I739 Peripheral vascular disease, unspecified: Secondary | ICD-10-CM

## 2014-06-22 ENCOUNTER — Encounter (HOSPITAL_COMMUNITY): Payer: Medicaid Other

## 2014-06-23 ENCOUNTER — Telehealth (HOSPITAL_COMMUNITY): Payer: Self-pay | Admitting: *Deleted

## 2014-06-23 MED ORDER — NITROGLYCERIN 0.4 MG SL SUBL: 0.4000 mg | SUBLINGUAL_TABLET | SUBLINGUAL | Status: DC | PRN

## 2014-06-23 NOTE — Telephone Encounter (Signed)
Spoke with pt, refill sent to the pharmacy. Patient denies chest pain. Follow up scheduled

## 2014-06-23 NOTE — Telephone Encounter (Signed)
Pt wants nitroglycerin called into his pharmacy

## 2014-06-30 ENCOUNTER — Other Ambulatory Visit: Payer: Self-pay | Admitting: Cardiology

## 2014-07-18 ENCOUNTER — Inpatient Hospital Stay (HOSPITAL_COMMUNITY): Admission: RE | Admit: 2014-07-18 | Payer: Medicaid Other | Source: Ambulatory Visit

## 2014-08-10 ENCOUNTER — Encounter (HOSPITAL_COMMUNITY): Payer: Medicaid Other

## 2014-08-22 ENCOUNTER — Other Ambulatory Visit: Payer: Self-pay | Admitting: Cardiology

## 2014-08-22 ENCOUNTER — Ambulatory Visit (INDEPENDENT_AMBULATORY_CARE_PROVIDER_SITE_OTHER): Payer: Medicare Other | Admitting: Cardiology

## 2014-08-22 ENCOUNTER — Ambulatory Visit (HOSPITAL_COMMUNITY)
Admission: RE | Admit: 2014-08-22 | Discharge: 2014-08-22 | Disposition: A | Discharge status: Home / Self Care | Payer: Medicare Other | Source: Ambulatory Visit | Attending: Cardiology | Admitting: Cardiology

## 2014-08-22 ENCOUNTER — Encounter: Payer: Self-pay | Admitting: Cardiology

## 2014-08-22 VITALS — BP 108/62 | HR 75 | Ht 71.0 in | Wt 281.1 lb

## 2014-08-22 DIAGNOSIS — I739 Peripheral vascular disease, unspecified: Secondary | ICD-10-CM

## 2014-08-22 DIAGNOSIS — I2510 Atherosclerotic heart disease of native coronary artery without angina pectoris: Secondary | ICD-10-CM | POA: Insufficient documentation

## 2014-08-22 DIAGNOSIS — E119 Type 2 diabetes mellitus without complications: Secondary | ICD-10-CM | POA: Diagnosis not present

## 2014-08-22 DIAGNOSIS — R609 Edema, unspecified: Secondary | ICD-10-CM | POA: Diagnosis not present

## 2014-08-22 DIAGNOSIS — I10 Essential (primary) hypertension: Secondary | ICD-10-CM | POA: Diagnosis not present

## 2014-08-22 DIAGNOSIS — E785 Hyperlipidemia, unspecified: Secondary | ICD-10-CM | POA: Diagnosis not present

## 2014-08-22 NOTE — Progress Notes (Signed)
HPI The patient presents for follow up of known CAD.  At the last visit he had some chest pain and lower extremity pain and edema.  He had a Lexiscan Myoview that was negative for evidence of ischemia.  He has done OK since then although he continues to have lower extremity edema.  He reports that he is on his feet all of the time.  The patient denies any new symptoms such as chest discomfort, neck or arm discomfort. There has been no new shortness of breath, PND or orthopnea. There have been no reported palpitations, presyncope or syncope.  He walks his grandchildren and will push a mower without cardiac complaints.     Allergies  Allergen Reactions  . Ibuprofen     Current Outpatient Prescriptions  Medication Sig Dispense Refill  . albuterol (PROVENTIL) (2.5 MG/3ML) 0.083% nebulizer solution Take 3 mLs (2.5 mg total) by nebulization every 6 (six) hours as needed for wheezing or shortness of breath. 75 mL prn  . albuterol (VENTOLIN HFA) 108 (90 BASE) MCG/ACT inhaler Inhale 2 puffs into the lungs every 4 (four) hours as needed for wheezing or shortness of breath. 1 Inhaler prn  . ALPRAZolam (XANAX) 0.25 MG tablet Take 1 tablet (0.25 mg total) by mouth 3 (three) times daily as needed for sleep (for anxiety). 20 tablet 5  . aspirin EC 325 MG tablet Take 325 mg by mouth daily.      Marland Kitchen eplerenone (INSPRA) 25 MG tablet take 1 tablet by mouth once daily 30 tablet 3  . Febuxostat (ULORIC) 80 MG TABS Take by mouth.      . fluticasone (FLONASE) 50 MCG/ACT nasal spray 1-2 puffs into each nostril, every night at bedtime 16 g prn  . insulin glargine (LANTUS) 100 UNIT/ML injection Inject into the skin as directed. 60-70 units every morning    . insulin lispro protamine-insulin lispro (HUMALOG MIX 75/25) (75-25) 100 UNIT/ML SUSP Inject into the skin as directed.      Marland Kitchen ipratropium (ATROVENT) 0.02 % nebulizer solution Take 2.5 mLs (0.5 mg total) by nebulization every 6 (six) hours as needed for wheezing or  shortness of breath. 75 mL prn  . JARDIANCE 25 MG TABS Take 1 tablet by mouth daily.    Marland Kitchen losartan (COZAAR) 50 MG tablet Take 25 mg by mouth daily.    . metoprolol (LOPRESSOR) 50 MG tablet take 1 tablet by mouth once daily 90 tablet 3  . nitroGLYCERIN (NITROSTAT) 0.4 MG SL tablet Place 1 tablet (0.4 mg total) under the tongue every 5 (five) minutes as needed. 25 tablet 3  . potassium chloride SA (K-DUR,KLOR-CON) 20 MEQ tablet Take 1 tablet (20 mEq total) by mouth daily. 30 tablet 11  . rosuvastatin (CRESTOR) 40 MG tablet Take 40 mg by mouth daily.    Marland Kitchen torsemide (DEMADEX) 20 MG tablet take 1 tablet by mouth twice a day 60 tablet 6  . traMADol (ULTRAM) 50 MG tablet Take 1 tablet by mouth as needed.  0  . [DISCONTINUED] simvastatin (ZOCOR) 80 MG tablet Take 80 mg by mouth daily.       No current facility-administered medications for this visit.    Past Medical History  Diagnosis Date  . CAD (coronary artery disease)     status post CABG in 2005 w LIMA to LAD, left radial to second circumflex marginal, saphenous vein graft to PDA, saphenous vein graft to lateral subbrach of ramus intermediate, and sequential saphenous vein graft to the medial subbranch  of ramus intermediate  . Acute pancreatitis   . Cirrhosis of liver without mention of alcohol   . Type II or unspecified type diabetes mellitus without mention of complication, not stated as uncontrolled   . Bronchitis, not specified as acute or chronic   . Other and unspecified hyperlipidemia   . Unspecified sleep apnea   . Acute myocardial infarction, unspecified site, episode of care unspecified   . Complications affecting other specified body systems, hypertension     Past Surgical History  Procedure Laterality Date  . Ent surgery    . Coronary artery bypass graft      ROS:  As stated in the HPI and negative for all other systems.  PHYSICAL EXAM BP 108/62 mmHg  Pulse 75  Ht 5\' 11"  (1.803 m)  Wt 281 lb 1.6 oz (127.506 kg)  BMI  39.22 kg/m2 GENERAL:  Well appearing HEENT:  Pupils equal round and reactive, fundi not visualized, oral mucosa unremarkable, poor dentition.   NECK:  No jugular venous distention, waveform within normal limits, carotid upstroke brisk and symmetric, no bruits, no thyromegaly LYMPHATICS:  No cervical, inguinal adenopathy LUNGS:  Clear to auscultation bilaterally BACK:  No CVA tenderness CHEST:  Well healed sternotomy scar. HEART:  PMI not displaced or sustained,S1 and S2 within normal limits, no S3, no S4, no clicks, no rubs, no murmurs ABD:  Flat, positive bowel sounds normal in frequency in pitch, no bruits, no rebound, no guarding, no midline pulsatile mass, no hepatomegaly, no splenomegaly, obese, umbilical hernia EXT:  2 plus pulses throughout, left leg edema mild, no cyanosis no clubbing, right radial scar SKIN:  No rashes no nodules, tense edema NEURO:  Cranial nerves II through XII grossly intact, motor grossly intact throughout PSYCH:  Cognitively intact, oriented to person place and time   EKG:  Normal sinus rhythm, rate 77, right bundle branch block, left atrial enlargement, QTC prolonged, no acute ST-T wave changes, PACs, no acute ST T wave changes.  08/22/2014  ASSESSMENT AND PLAN  CAD -  The patient has no new sypmtoms.  No further cardiovascular testing is indicated.  We will continue with aggressive risk reduction and meds as listed.   OBESITY, UNSPECIFIED -  The patient understands the need to lose weight with diet and exercise. We have discussed specific strategies for this.  Edema -  We discussed conservative therapy with knee high compression stockings, keeping feet elevated and watching salt.   PVD -  I will check the results of the ABIs done today.    Hyperlipidemia - He is followed by endocrinology for management of this and his diabetes. I will defer to their management.   HYPERTENSION NEC -  The blood pressure is at target. No change in medications is  indicated. We will continue with therapeutic lifestyle changes (TLC).

## 2014-08-22 NOTE — Patient Instructions (Signed)
Your physician wants you to follow-up in: 1 Year. You will receive a reminder letter in the mail two months in advance. If you don't receive a letter, please call our office to schedule the follow-up appointment.  

## 2014-08-23 ENCOUNTER — Encounter: Payer: Self-pay | Admitting: Cardiology

## 2014-10-10 ENCOUNTER — Telehealth: Payer: Self-pay | Admitting: Internal Medicine

## 2014-10-10 NOTE — Telephone Encounter (Signed)
Received request from pharmacy for medication refill. Patient has not been seen in our office since 04/2013 Called and spoke with patient, advised him that he needs an appointment Patient scheduled to see Dr. Annamaria Boots 10/12/2014 at 3:15pm  Nothing further needed.

## 2014-10-12 ENCOUNTER — Telehealth: Payer: Self-pay | Admitting: Internal Medicine

## 2014-10-12 ENCOUNTER — Ambulatory Visit: Payer: Medicare Other | Admitting: Internal Medicine

## 2014-10-12 NOTE — Telephone Encounter (Signed)
Im sorry but I can't refill xanax. Ask PCP for this.  Routine ov as available.   Perhaps TP can see him sooner.

## 2014-10-12 NOTE — Telephone Encounter (Signed)
Spoke with pt, states he had an appt today with CY but had to cancel d/t car troubles.  Pt last seen 04/2013.  Pt requesting xanax refill- I asked pt if his pcp has been filling this since we last saw him, states he has not been taking this since CY last filled it.  Next available ov is 02/2015.    CY please advise if you're ok with refilling xanax, and if pt can be worked in sooner than Jan. 2017.  thanks

## 2014-10-12 NOTE — Telephone Encounter (Signed)
Called and spoke pt. Informed him of the recs per CY. Appt made with TP on 9.8.2016. Pt verbalized understanding and denied any further questions or concerns at this time.

## 2014-10-19 ENCOUNTER — Ambulatory Visit: Payer: Medicare Other | Admitting: Adult Health

## 2014-10-20 ENCOUNTER — Encounter: Payer: Self-pay | Admitting: Adult Health

## 2014-10-20 ENCOUNTER — Ambulatory Visit (INDEPENDENT_AMBULATORY_CARE_PROVIDER_SITE_OTHER): Payer: Medicare Other | Admitting: Adult Health

## 2014-10-20 ENCOUNTER — Telehealth: Payer: Self-pay | Admitting: Internal Medicine

## 2014-10-20 VITALS — BP 138/78 | HR 85 | Temp 98.6°F | Ht 71.0 in | Wt 285.0 lb

## 2014-10-20 DIAGNOSIS — J40 Bronchitis, not specified as acute or chronic: Secondary | ICD-10-CM

## 2014-10-20 DIAGNOSIS — G4733 Obstructive sleep apnea (adult) (pediatric): Secondary | ICD-10-CM

## 2014-10-20 NOTE — Telephone Encounter (Signed)
Opened in error

## 2014-10-20 NOTE — Progress Notes (Signed)
05/06/11- 1 yoM former smoker with sleep apnea, bronchitis, SAR, CAD/MI/CABG LOV-10/20/08  He comes for recheck. Has not worn CPAP since MI/CABG. Thinks he is breathing well. Does admit he needs to restart CPAP. Daytime sleepiness, snoring, aware he stops breathing at night.  Occasional cough with cold. Does get "congested" with a little wheeze and he coughs with grass mowing. Still has a nebulizer machine which helps.  06/19/11- 9 yoM former smoker with sleep apnea, bronchitis, SAR, CAD/MI/CABG Not wearing CPAP at this time-getting with [employer] to check pressure to restart; However he is still using O2; review PFT with patient. Has not used CPAP since his CABG in 2005, planning autotitration with his DME company/ [employer] to restart as discussed previously. Continues oxygen 2 L for sleep/ [employer]. PFT 06/18/2011-moderate restriction. Flows are normal for measure volume with FEV1/FVC 0.84 and insignificant response to bronchodilator. DLCO 52%.  10/23/11- 10 yoM former smoker with sleep apnea, bronchitis, SAR, CAD/MI/CABG    wife here Not using CPAP (due to recent moved) and has not used O2 in about 1 month. They will be in their new home until next month. Wife confirms that he is snoring now without his CPAP. He is pending a cardiac stress test and I again explained concerns of untreated sleep apnea and hypoxemia with background cardiac disease. He feels well. Occasional use of rescue inhaler with little wheeze or cough. CXR 06/26/11-  IMPRESSION:  Stable cardiac enlargement.  Original Report Authenticated By: Angelita Ingles, M.D.   04/29/13- 59 yoM former smoker with sleep apnea, bronchitis, SAR, CAD/MI/CABG    wife here FOLLOWS FOR: has not used CPAP since 2005 after his open heart surgery. Wife reports loud snoring. No routine breathing problems, cough or wheeze. No recent cardiac event. Does not eat rescue inhaler and is not using home oxygen/[employer]. When he tried using CPAP after his heart  surgery, the pressure hurt his sternum  10/20/2014 Follow up OSA /Bronchitis  Returns for work in  Wants to get back on CPAP .  Was suppose to have sleep study but did not go for repeat study .  Previously on CPAP greater then 10 years ago, Was seen last year and recommended to have a repeat sleep study . He continues to have daytime sleepiness. We discussed setting up sleep study .  Has some sob on/off, no chest pain, wheezing or orthopnea.  Seems to be chronic for patient. No cough or fever.    ROS-see HPI Constitutional:   No-   weight loss, night sweats, fevers, chills, + fatigue, lassitude. HEENT:   No-  headaches, difficulty swallowing, tooth/dental problems, sore throat,       No-  sneezing, itching, ear ache, nasal congestion, post nasal drip,  CV:  No-   chest pain, orthopnea, PND, swelling in lower extremities, anasarca,  dizziness, palpitations Resp:             No-   productive cough,  No non-productive cough,  No- coughing up of blood.              No-   change in color of mucus.  Skin: No-   rash or lesions. GI:  No-   heartburn, indigestion, abdominal pain, nausea, vomiting,  GU: . MS:  No-   joint pain or swelling.   Neuro-     nothing unusual Psych:  No- change in mood or affect. No depression or anxiety.  No memory loss.   OBJ- Physical Exam General- Alert, Oriented, Affect-appropriate,  Distress- none acute, obese Vitals signs reviewed  Skin- rash-none, lesions- none, excoriation- none.  Lymphadenopathy- none Head- atraumatic            Eyes- Gross vision intact, PERRLA, conjunctivae and secretions clear            Ears- Hearing, canals-normal            Nose- Clear, no-Septal dev, mucus, polyps, erosion, perforation             Throat- Mallampati IV , mucosa clear , drainage- none, tonsils- atrophic, poor dentition  Neck- flexible , trachea midline, no stridor , thyroid nl, carotid no bruit Chest - symmetrical excursion , unlabored           Heart/CV- RRR , no  murmur , no gallop  , no rub, nl s1 s2                           - JVD- none , edema- none, stasis changes- none, varices- none           Lung- clear to P&A, wheeze- none, cough- none , dullness-none, rub- none           Chest wall- sternotomy scar Abd-  Br/ Gen/ Rectal- Not done, not indicated Extrem- cyanosis- none, clubbing, none, atrophy- none, strength- nl. Vascular graft donor site scar left forearm. Neuro- grossly intact to observation

## 2014-10-20 NOTE — Patient Instructions (Addendum)
We will set you up for a split night sleep study .  Work on weight loss .  Do not drive if sleepy.  Follow up Dr. Annamaria Boots  In 6-8 weeks and As needed   Flu shot today

## 2014-10-23 ENCOUNTER — Other Ambulatory Visit: Payer: Self-pay | Admitting: Cardiology

## 2014-10-24 NOTE — Assessment & Plan Note (Signed)
Appears stable without flare   Plan  Flu shot today

## 2014-10-24 NOTE — Assessment & Plan Note (Signed)
Previous OSA -will need repeat Sleep study to restart CPAP   Plan  We will set you up for a split night sleep study .  Work on weight loss .  Do not drive if sleepy.  Follow up Dr. Annamaria Boots  In 6-8 weeks and As needed

## 2015-01-12 ENCOUNTER — Encounter (HOSPITAL_BASED_OUTPATIENT_CLINIC_OR_DEPARTMENT_OTHER): Payer: Medicare Other

## 2015-01-15 ENCOUNTER — Other Ambulatory Visit: Payer: Self-pay | Admitting: Cardiology

## 2015-01-16 NOTE — Telephone Encounter (Signed)
Rx request sent to pharmacy.  

## 2015-02-15 ENCOUNTER — Ambulatory Visit: Payer: Medicare Other | Admitting: Internal Medicine

## 2015-02-15 DIAGNOSIS — G4733 Obstructive sleep apnea (adult) (pediatric): Secondary | ICD-10-CM

## 2015-02-15 NOTE — Progress Notes (Signed)
Pt was not seen by MD today as he had not had sleep study done. Pt is getting appt set up with Lynnae Sandhoff at sleep lab and will let me know when appt is so I can ensure ROV with CY.

## 2015-02-25 ENCOUNTER — Encounter (HOSPITAL_COMMUNITY): Payer: Self-pay | Admitting: Emergency Medicine

## 2015-02-25 ENCOUNTER — Emergency Department (HOSPITAL_COMMUNITY): Payer: Medicare Other

## 2015-02-25 ENCOUNTER — Emergency Department (HOSPITAL_COMMUNITY)
Admission: EM | Admit: 2015-02-25 | Discharge: 2015-02-25 | Disposition: A | Discharge status: Home / Self Care | Payer: Medicare Other | Attending: Emergency Medicine | Admitting: Emergency Medicine

## 2015-02-25 DIAGNOSIS — Z7982 Long term (current) use of aspirin: Secondary | ICD-10-CM | POA: Diagnosis not present

## 2015-02-25 DIAGNOSIS — I252 Old myocardial infarction: Secondary | ICD-10-CM | POA: Insufficient documentation

## 2015-02-25 DIAGNOSIS — Z951 Presence of aortocoronary bypass graft: Secondary | ICD-10-CM | POA: Diagnosis not present

## 2015-02-25 DIAGNOSIS — Z7951 Long term (current) use of inhaled steroids: Secondary | ICD-10-CM | POA: Insufficient documentation

## 2015-02-25 DIAGNOSIS — Z794 Long term (current) use of insulin: Secondary | ICD-10-CM | POA: Insufficient documentation

## 2015-02-25 DIAGNOSIS — Z8669 Personal history of other diseases of the nervous system and sense organs: Secondary | ICD-10-CM | POA: Insufficient documentation

## 2015-02-25 DIAGNOSIS — Z8719 Personal history of other diseases of the digestive system: Secondary | ICD-10-CM | POA: Diagnosis not present

## 2015-02-25 DIAGNOSIS — Z87891 Personal history of nicotine dependence: Secondary | ICD-10-CM | POA: Insufficient documentation

## 2015-02-25 DIAGNOSIS — I2510 Atherosclerotic heart disease of native coronary artery without angina pectoris: Secondary | ICD-10-CM | POA: Diagnosis not present

## 2015-02-25 DIAGNOSIS — F419 Anxiety disorder, unspecified: Secondary | ICD-10-CM | POA: Insufficient documentation

## 2015-02-25 DIAGNOSIS — E119 Type 2 diabetes mellitus without complications: Secondary | ICD-10-CM | POA: Insufficient documentation

## 2015-02-25 DIAGNOSIS — Z79899 Other long term (current) drug therapy: Secondary | ICD-10-CM | POA: Insufficient documentation

## 2015-02-25 DIAGNOSIS — E785 Hyperlipidemia, unspecified: Secondary | ICD-10-CM | POA: Diagnosis not present

## 2015-02-25 DIAGNOSIS — R251 Tremor, unspecified: Secondary | ICD-10-CM | POA: Diagnosis not present

## 2015-02-25 DIAGNOSIS — Z8709 Personal history of other diseases of the respiratory system: Secondary | ICD-10-CM | POA: Insufficient documentation

## 2015-02-25 DIAGNOSIS — R000 Tachycardia, unspecified: Secondary | ICD-10-CM | POA: Insufficient documentation

## 2015-02-25 LAB — BASIC METABOLIC PANEL
Anion gap: 10 (ref 5–15)
BUN: 16 mg/dL (ref 6–20)
CO2: 27 mmol/L (ref 22–32)
CREATININE: 1.44 mg/dL — AB (ref 0.61–1.24)
Calcium: 8.8 mg/dL — ABNORMAL LOW (ref 8.9–10.3)
Chloride: 100 mmol/L — ABNORMAL LOW (ref 101–111)
GFR, EST AFRICAN AMERICAN: 59 mL/min — AB (ref >60)
GFR, EST NON AFRICAN AMERICAN: 51 mL/min — AB (ref >60)
Glucose, Bld: 195 mg/dL — ABNORMAL HIGH (ref 65–99)
POTASSIUM: 3.6 mmol/L (ref 3.5–5.1)
SODIUM: 137 mmol/L (ref 135–145)

## 2015-02-25 LAB — I-STAT TROPONIN, ED
TROPONIN I, POC: 0 ng/mL (ref 0.00–0.08)
Troponin i, poc: 0 ng/mL (ref 0.00–0.08)

## 2015-02-25 LAB — URINALYSIS, ROUTINE W REFLEX MICROSCOPIC
Bilirubin Urine: NEGATIVE
Glucose, UA: >1000 mg/dL — AB
Hgb urine dipstick: NEGATIVE
Ketones, ur: NEGATIVE mg/dL
LEUKOCYTES UA: NEGATIVE
Nitrite: NEGATIVE
PROTEIN: NEGATIVE mg/dL
Specific Gravity, Urine: 1.016 (ref 1.005–1.030)
pH: 5 (ref 5.0–8.0)

## 2015-02-25 LAB — CBC WITH DIFFERENTIAL/PLATELET
BASOS PCT: 0 %
Basophils Absolute: 0 10*3/uL (ref 0.0–0.1)
EOS ABS: 0.1 10*3/uL (ref 0.0–0.7)
Eosinophils Relative: 1 %
HCT: 46.9 % (ref 39.0–52.0)
Hemoglobin: 15.7 g/dL (ref 13.0–17.0)
Lymphocytes Relative: 8 %
Lymphs Abs: 1.1 10*3/uL (ref 0.7–4.0)
MCH: 33.1 pg (ref 26.0–34.0)
MCHC: 33.5 g/dL (ref 30.0–36.0)
MCV: 98.7 fL (ref 78.0–100.0)
Monocytes Absolute: 1.1 10*3/uL — ABNORMAL HIGH (ref 0.1–1.0)
Monocytes Relative: 8 %
NEUTROS PCT: 83 %
Neutro Abs: 11.4 10*3/uL — ABNORMAL HIGH (ref 1.7–7.7)
Platelets: 200 10*3/uL (ref 150–400)
RBC: 4.75 MIL/uL (ref 4.22–5.81)
RDW: 14.2 % (ref 11.5–15.5)
WBC: 13.7 10*3/uL — ABNORMAL HIGH (ref 4.0–10.5)

## 2015-02-25 LAB — I-STAT CG4 LACTIC ACID, ED
LACTIC ACID, VENOUS: 1.75 mmol/L (ref 0.5–2.0)
Lactic Acid, Venous: 2.23 mmol/L (ref 0.5–2.0)

## 2015-02-25 LAB — BRAIN NATRIURETIC PEPTIDE: B Natriuretic Peptide: 54.3 pg/mL (ref 0.0–100.0)

## 2015-02-25 LAB — URINE MICROSCOPIC-ADD ON: Bacteria, UA: NONE SEEN

## 2015-02-25 LAB — MAGNESIUM: Magnesium: 1.7 mg/dL (ref 1.7–2.4)

## 2015-02-25 MED ORDER — ACETAMINOPHEN 325 MG PO TABS
325.0000 mg | ORAL_TABLET | Freq: Once | ORAL | Status: AC
  Administered 2015-02-25: 325 mg via ORAL
  Filled 2015-02-25 (×2): qty 1

## 2015-02-25 MED ORDER — ACETAMINOPHEN 325 MG PO TABS
650.0000 mg | ORAL_TABLET | Freq: Once | ORAL | Status: AC
  Administered 2015-02-25: 650 mg via ORAL
  Filled 2015-02-25: qty 2

## 2015-02-25 MED ORDER — LORAZEPAM 0.5 MG PO TABS
0.5000 mg | ORAL_TABLET | Freq: Once | ORAL | Status: AC
  Administered 2015-02-25: 0.5 mg via ORAL
  Filled 2015-02-25: qty 1

## 2015-02-25 MED ORDER — SODIUM CHLORIDE 0.9 % IV BOLUS (SEPSIS)
500.0000 mL | Freq: Once | INTRAVENOUS | Status: AC
  Administered 2015-02-25: 500 mL via INTRAVENOUS

## 2015-02-25 NOTE — ED Notes (Signed)
Awake. Verbally responsive. A/O x4. Resp even and unlabored. No audible adventitious breath sounds noted. ABC's intact. ST on monitor. Pt having episodes of ST at 135-145bpm on monitor. Dr. Viviana Simpler aware with new orders noted. Family at bedside.

## 2015-02-25 NOTE — ED Notes (Addendum)
Awake. Verbally responsive. A/O x4. Resp even and unlabored. No audible adventitious breath sounds noted. ABC's intact. ST on monitor. Family at bedside. 

## 2015-02-25 NOTE — ED Notes (Addendum)
Awake. Verbally responsive. A/O x4. Resp even and unlabored. No audible adventitious breath sounds noted. ABC's intact. ST on monitor. Pt requesting to sit on side of bed for comfort. Family at bedside.

## 2015-02-25 NOTE — ED Notes (Signed)
Awake. Verbally responsive. A/O x4. Resp even and unlabored. No audible adventitious breath sounds noted. ABC's intact. ST on monitor. Family at bedside. 

## 2015-02-25 NOTE — ED Notes (Signed)
Awake. Verbally responsive. A/O x4. Resp even and unlabored. No audible adventitious breath sounds noted. ABC's intact.  

## 2015-02-25 NOTE — ED Notes (Signed)
Bed: [id number] Expected date:  Expected time:  Means of arrival:  Comments: EMS 60 yo male with uncontrolled tremors/SOB

## 2015-02-25 NOTE — Discharge Instructions (Signed)
°  Return without fail for worsening symptoms, including palpitations, feeling like you are about to pass out or will pass out, difficulty breathing, chest pain, vomiting and unable to keep down food/fluids, fever, or any other symptoms concerning to you. Please follow-up with your primary care doctor this week for close follow-up.     Panic Attacks Panic attacks are sudden, short feelings of great fear or discomfort. You may have them for no reason when you are relaxed, when you are uneasy (anxious), or when you are sleeping.  HOME CARE  Take all your medicines as told.  Check with your doctor before starting new medicines.  Keep all doctor visits. GET HELP IF:  You are not able to take your medicines as told.  Your symptoms do not get better.  Your symptoms get worse. GET HELP RIGHT AWAY IF:  Your attacks seem different than your normal attacks.  You have thoughts about hurting yourself or others.  You take panic attack medicine and you have a side effect. MAKE SURE YOU:  Understand these instructions.  Will watch your condition.  Will get help right away if you are not doing well or get worse.   This information is not intended to replace advice given to you by your health care provider. Make sure you discuss any questions you have with your health care provider.   Document Released: 03/01/2010 Document Revised: 11/17/2012 Document Reviewed: 09/10/2012 Elsevier Interactive Patient Education [employer].

## 2015-02-25 NOTE — ED Notes (Signed)
Awake. Verbally responsive. A/O x4. Resp even and unlabored. No audible adventitious breath sounds noted. ABC's intact. ST on monitor. IV infusing NS at 578ml/hr without difficulty. Family at bedside.

## 2015-02-25 NOTE — ED Notes (Addendum)
Per [facility]  Pt. From home with complaint of panic attack with tremors, pt. Stated that he woke up at 2am this morning with anxiety and started shaking . Pt. Stated that he had this tremors several years ago every time he got anxious. Pt. Stated that he is on multiple maintenance of medications and he is taking them regularly. Denies hx of seizure.Pt. Also stated that he feels like he have hernia on his right groin which he just felt also this morning, pain at 5/10. Pt. Ambulatory upon arrival to ED with steady gait. No SOB. No tremors noted upon arrival to ED.

## 2015-02-25 NOTE — ED Provider Notes (Signed)
CSN: [id number]     Arrival date & time 02/25/15  [id number] History   First MD Initiated Contact with Patient 02/25/15 [phone]     Chief Complaint  Patient presents with  . Panic Attack  . Tremors     (Consider location/radiation/quality/duration/timing/severity/associated sxs/prior Treatment) HPI 60 year old male who presents with panic attack. History of CAD, HTN, HLD, DM. States history of anxiety with panic attacks for which he has taken xanax, prescribed by his PCP. States many stressors recently, including the funeral of a friend today. States he was worried about bills he has not yet paid and was thinking about them all last night. Also with a friend in the hospital with aneurysm, he states. States he woke up at 2 AM in the morning feeling very shaky, tremulous, anxious, and short of breath. He took all his morning medication at [facility] and went to bed. Woke up at 5 AM again with shakiness, tremulousness, and anxiety. Says this is typical of his panic attacks, but last one was a few years ago in the setting of his sister's death. Denies any chest pain, syncope or near syncope, orthopnea or PND, increased lower extremity edema, cough, recent illnesses, fevers or chills, abdominal pain, nausea or vomiting, HA, numbness/weakness, melena, hematochezia. States that he otherwise has been in his usual state of health.   Past Medical History  Diagnosis Date  . CAD (coronary artery disease)     status post CABG in 2005 w LIMA to LAD, left radial to second circumflex marginal, saphenous vein graft to PDA, saphenous vein graft to lateral subbrach of ramus intermediate, and sequential saphenous vein graft to the medial subbranch of ramus intermediate  . Acute pancreatitis   . Cirrhosis of liver without mention of alcohol   . Type II or unspecified type diabetes mellitus without mention of complication, not stated as uncontrolled   . Bronchitis, not specified as acute or chronic   . Other and unspecified  hyperlipidemia   . Unspecified sleep apnea   . Acute myocardial infarction, unspecified site, episode of care unspecified   . Complications affecting other specified body systems, hypertension    Past Surgical History  Procedure Laterality Date  . Ent surgery    . Coronary artery bypass graft     Family History  Problem Relation Age of Onset  . Breast cancer Mother   . Prostate cancer Father   . Coronary artery disease Father     underwent CABG   Social History  Substance Use Topics  . Smoking status: Former Smoker    Quit date: 10/20/2004  . Smokeless tobacco: None  . Alcohol Use: Yes     Comment: occasional    Review of Systems 10/14 systems reviewed and are negative other than those stated in the HPI   Allergies  Ibuprofen  Home Medications   Prior to Admission medications   Medication Sig Start Date End Date Taking? Authorizing Provider  ALPRAZolam (XANAX) 0.25 MG tablet Take 1 tablet (0.25 mg total) by mouth 3 (three) times daily as needed for sleep (for anxiety). 04/29/13  Yes Deneise Lever, MD  aspirin EC 325 MG tablet Take 325 mg by mouth daily.     Yes Historical Provider, MD  eplerenone (INSPRA) 25 MG tablet take 1 tablet by mouth once daily 10/24/14  Yes Minus Breeding, MD  fluticasone [facility]) 50 MCG/ACT nasal spray Place 1 spray into both nostrils daily.   Yes Historical Provider, MD  HUMALOG KWIKPEN 200 UNIT/ML  SOPN Inject 10-30 Units into the skin 3 (three) times daily with meals. 20 units with smaller meals 30 units with regular meals and 10 units with snacks 02/21/15  Yes Historical Provider, MD  JARDIANCE 25 MG TABS Take 25 mg by mouth daily.  04/03/13  Yes Historical Provider, MD  LANTUS SOLOSTAR 100 UNIT/ML Solostar Pen Inject 70 Units into the skin every morning.  02/02/15  Yes Historical Provider, MD  losartan (COZAAR) 50 MG tablet Take 25 mg by mouth daily. 09/27/10  Yes Minus Breeding, MD  metoprolol (LOPRESSOR) 50 MG tablet take 1 tablet by mouth  once daily 05/08/10  Yes Minus Breeding, MD  nitroGLYCERIN (NITROSTAT) 0.4 MG SL tablet Place 1 tablet (0.4 mg total) under the tongue every 5 (five) minutes as needed. 06/23/14  Yes Minus Breeding, MD  rosuvastatin (CRESTOR) 20 MG tablet Take 20 mg by mouth every evening. 12/31/14  Yes Historical Provider, MD  torsemide (DEMADEX) 20 MG tablet take 1 tablet by mouth twice a day 01/16/15  Yes Minus Breeding, MD  albuterol (PROVENTIL) (2.5 MG/3ML) 0.083% nebulizer solution Take 3 mLs (2.5 mg total) by nebulization every 6 (six) hours as needed for wheezing or shortness of breath. Patient not taking: Reported on 02/25/2015 04/29/13   Deneise Lever, MD  ipratropium (ATROVENT) 0.02 % nebulizer solution Take 2.5 mLs (0.5 mg total) by nebulization every 6 (six) hours as needed for wheezing or shortness of breath. Patient not taking: Reported on 02/25/2015 04/29/13   Deneise Lever, MD  potassium chloride SA (K-DUR,KLOR-CON) 20 MEQ tablet Take 1 tablet (20 mEq total) by mouth daily. Patient not taking: Reported on 10/20/2014 10/26/12   Minus Breeding, MD   BP 95/62 mmHg  Pulse 121  Temp(Src) 98.7 F (37.1 C) (Oral)  Resp 18  SpO2 96% Physical Exam Physical Exam  Nursing note and vitals reviewed. Constitutional: Well developed, well nourished, non-toxic, and in no acute distress Head: Normocephalic and atraumatic.  Mouth/Throat: Oropharynx is clear and moist.  Neck: Normal range of motion. Neck supple.  Cardiovascular: Normal rate and regular rhythm.  +1 pitting edema (stable per patient) Pulmonary/Chest: Effort normal and breath sounds normal.  Abdominal: Soft. Obese. There is no tenderness. There is no rebound and no guarding.  Musculoskeletal: Normal range of motion.  Neurological: Alert, no facial droop, fluent speech, moves all extremities symmetrically Skin: Skin is warm and dry.  Psychiatric: Cooperative  ED Course  Procedures (including critical care time) Labs Review Labs Reviewed  CBC  WITH DIFFERENTIAL/PLATELET - Abnormal; Notable for the following:    WBC 13.7 (*)    Neutro Abs 11.4 (*)    Monocytes Absolute 1.1 (*)    All other components within normal limits  BASIC METABOLIC PANEL - Abnormal; Notable for the following:    Chloride 100 (*)    Glucose, Bld 195 (*)    Creatinine, Ser 1.44 (*)    Calcium 8.8 (*)    GFR calc non Af Amer 51 (*)    GFR calc Af Amer 59 (*)    All other components within normal limits  URINALYSIS, ROUTINE W REFLEX MICROSCOPIC (NOT AT [facility]) - Abnormal; Notable for the following:    Glucose, UA >1000 (*)    All other components within normal limits  URINE MICROSCOPIC-ADD ON - Abnormal; Notable for the following:    Squamous Epithelial / LPF 0-5 (*)    All other components within normal limits  I-STAT CG4 LACTIC ACID, ED - Abnormal; Notable for the  following:    Lactic Acid, Venous 2.23 (*)    All other components within normal limits  BRAIN NATRIURETIC PEPTIDE  MAGNESIUM  I-STAT TROPOININ, ED  I-STAT TROPOININ, ED  I-STAT CG4 LACTIC ACID, ED  I-STAT CG4 LACTIC ACID, ED    Imaging Review Dg Chest 2 View  02/25/2015  CLINICAL DATA:  Shortness of breath with anxiety and tremors. EXAM: CHEST  2 VIEW COMPARISON:  06/19/2011 FINDINGS: Median sternotomy wires unchanged. Lungs are adequately inflated with mild prominence of the perihilar markings suggesting vascular congestion. No evidence of lobar consolidation or effusion. Mild stable cardiomegaly. Remainder of the exam is unchanged. IMPRESSION: Mild stable cardiomegaly with mild vascular congestion. Electronically Signed   By: Marin Olp M.D.   On: 02/25/2015 07:54   I have personally reviewed and evaluated these images and lab results as part of my medical decision-making.   EKG Interpretation   Date/Time:  Sunday February 25 2015 09:52:35 EST Ventricular Rate:  135 PR Interval:  172 QRS Duration: 118 QT Interval:  367 QTC Calculation: 550 R Axis:   -32 Text Interpretation:   Sinus tachycardia Consider right atrial enlargement  Right bundle branch block LVH by voltage ST elevation, consider inferior  injury Prolonged QT interval Confirmed by LIU MD, Hinton Dyer [id number]) on  02/25/2015 10:00:34 AM      MDM   Final diagnoses:  Tremulousness  Tachycardia  Anxiety    60 year old male with history of CAD, DM, HTN, HLD who presents with two episodes of anxiety, sob, tremulousness this morning, now resolved. He is well appearing on arrival, calm, and says aside from occasional restlessness, he feels much better than before. Initial vital signs are within normal limits, and not concerning. Cardiopulmonary exam unremarkable and remainder of exam non-focal. Presentation seems consistent with stated prior history of panic attack and anxiety, but given age and comorbidities additional work up was pursued.   Basic blood work without significant electrolyte or metabolic derangements. Serial troponins are negative. CXR aside from mild pulmonary vascular congestion without evidence of fluid overload by history/exam. EKG initially showing bigeminy, which resolved on cardiac monitor. Becomes increasingly intermittently tachycardic and was briefly 120-130s for a few minutes, before coming down to 110s on it's own. States he was asymptomatic during this. EKg during tachycardic suggestive of sinus tachycardia. Unclear of etiology of this at this time. Unlikely PE as having no other symptoms and no risk factors for this. On metoprolol and states that he believes he did take this medication this morning. No evidence of infectious causes. Discussed with patient, unclear of etiology of tachycardia and discussed potential admission for observation and cardiac monitoring. He states that he would like to be discharged home. Reviewed risk and benefits, and reviewed strict return instructions. He expressed understanding of all discharge instructions and felt comfortable with the plan of care.   Forde Dandy, MD 02/25/15 [phone]

## 2015-07-02 ENCOUNTER — Telehealth: Payer: Self-pay | Admitting: Internal Medicine

## 2015-07-02 NOTE — Telephone Encounter (Signed)
Spoke with pt and scheduled appt for Friday. Nothing further needed.

## 2015-07-02 NOTE — Telephone Encounter (Signed)
Pt can be worked in on Wednesday or Friday in held spots. Thanks

## 2015-07-02 NOTE — Telephone Encounter (Signed)
Pt requesting a visit with Dr Annamaria Boots today if possible for increased allergies and some SOB x 1 week.  Please advise if this patient can be worked in to the be seen this week. Thanks.  (this patient and friend Rollene Fare are both [facility] patient's and want to be seen the same day as they ride together)

## 2015-07-06 ENCOUNTER — Encounter: Payer: Self-pay | Admitting: Internal Medicine

## 2015-07-06 ENCOUNTER — Ambulatory Visit (INDEPENDENT_AMBULATORY_CARE_PROVIDER_SITE_OTHER): Payer: Medicare Other | Admitting: Internal Medicine

## 2015-07-06 VITALS — BP 132/78 | HR 82 | Ht 71.0 in | Wt 292.8 lb

## 2015-07-06 DIAGNOSIS — J40 Bronchitis, not specified as acute or chronic: Secondary | ICD-10-CM

## 2015-07-06 DIAGNOSIS — E669 Obesity, unspecified: Secondary | ICD-10-CM

## 2015-07-06 DIAGNOSIS — G4733 Obstructive sleep apnea (adult) (pediatric): Secondary | ICD-10-CM

## 2015-07-06 DIAGNOSIS — I519 Heart disease, unspecified: Secondary | ICD-10-CM

## 2015-07-06 MED ORDER — ALBUTEROL SULFATE HFA 108 (90 BASE) MCG/ACT IN AERS: 2.0000 | INHALATION_SPRAY | Freq: Four times a day (QID) | RESPIRATORY_TRACT | Status: DC | PRN

## 2015-07-06 MED ORDER — FLUTICASONE PROPIONATE 50 MCG/ACT NA SUSP: 1.0000 | Freq: Every day | NASAL | Status: DC

## 2015-07-06 NOTE — Assessment & Plan Note (Signed)
Most of his exertional dyspnea reflects deconditioning, obesity and cardiac status Plan-refill meds as requested and discussed

## 2015-07-06 NOTE — Patient Instructions (Addendum)
Please reorder split NPSG   Dx OSA, hx heart disease  Refills sent for flonase and albuterol rescue inhaler

## 2015-07-06 NOTE — Assessment & Plan Note (Signed)
We need to update documentation before ordering CPAP. I think he is willing to go forward now with repeat sleep study as discussed.

## 2015-07-06 NOTE — Progress Notes (Signed)
05/06/11- 62 yoM former smoker with sleep apnea, bronchitis, SAR, CAD/MI/CABG LOV-10/20/08  He comes for recheck. Has not worn CPAP since MI/CABG. Thinks he is breathing well. Does admit he needs to restart CPAP. Daytime sleepiness, snoring, aware he stops breathing at night.  Occasional cough with cold. Does get "congested" with a little wheeze and he coughs with grass mowing. Still has a nebulizer machine which helps.  06/19/11- 47 yoM former smoker with sleep apnea, bronchitis, SAR, CAD/MI/CABG Not wearing CPAP at this time-getting with [employer] to check pressure to restart; However he is still using O2; review PFT with patient. Has not used CPAP since his CABG in 2005, planning autotitration with his DME company/ [employer] to restart as discussed previously. Continues oxygen 2 L for sleep/ [employer]. PFT 06/18/2011-moderate restriction. Flows are normal for measure volume with FEV1/FVC 0.84 and insignificant response to bronchodilator. DLCO 52%.  10/23/11- 40 yoM former smoker with sleep apnea, bronchitis, SAR, CAD/MI/CABG    wife here Not using CPAP (due to recent moved) and has not used O2 in about 1 month. They will be in their new home until next month. Wife confirms that he is snoring now without his CPAP. He is pending a cardiac stress test and I again explained concerns of untreated sleep apnea and hypoxemia with background cardiac disease. He feels well. Occasional use of rescue inhaler with little wheeze or cough. CXR 06/26/11-  IMPRESSION:  Stable cardiac enlargement.  Original Report Authenticated By: Angelita Ingles, M.D.   04/29/13- 10 yoM former smoker with sleep apnea, bronchitis, SAR, CAD/MI/CABG    wife here FOLLOWS FOR: has not used CPAP since 2005 after his open heart surgery. Wife reports loud snoring. No routine breathing problems, cough or wheeze. No recent cardiac event. Does not eat rescue inhaler and is not using home oxygen/[employer]. When he tried using CPAP after his heart  surgery, the pressure hurt his sternum.  07/06/2015-60 year old male former smoker followed for OSA, chronic bronchitis, SAR, CAD/MI/CABG    LOV 10/20/14- NP- scheduled split-night sleep study FOLLOWS FOR: Never had sleep study; has SOB with exertion at times.  Blames heat and humidity for exertional dyspnea with little cough or wheeze. Admits daytime sleepiness and wife still reports loud snoring. He agrees to go forward with sleep study previously scheduled but he never set a date before. CXR 02/25/2015- IMPRESSION: Mild stable cardiomegaly with mild vascular congestion. Electronically Signed  By: Marin Olp M.D.  On: 02/25/2015 07:54  ROS-see HPI Constitutional:   No-   weight loss, night sweats, fevers, chills, fatigue, lassitude. HEENT:   No-  headaches, difficulty swallowing, tooth/dental problems, sore throat,       No-  sneezing, itching, ear ache, nasal congestion, post nasal drip,  CV:  No-   chest pain, orthopnea, PND, swelling in lower extremities, anasarca,  dizziness, palpitations Resp: + of breath with exertion or at rest.              No-   productive cough,  No non-productive cough,  No- coughing up of blood.              No-   change in color of mucus.  + Occasional wheezing.   Skin: No-   rash or lesions. GI:  No-   heartburn, indigestion, abdominal pain, nausea, vomiting,  GU: . MS:  No-   joint pain or swelling.   Neuro-     nothing unusual Psych:  No- change in mood or affect. No depression or  anxiety.  No memory loss.   OBJ- Physical Exam General- Alert, Oriented, Affect-appropriate, Distress- none acute, obese, + yawning Skin- rash-none, lesions- none, excoriation- none. Lymphadenopathy- none Head- atraumatic            Eyes- Gross vision intact, PERRLA, conjunctivae and secretions clear            Ears- Hearing, canals-normal            Nose- Clear, no-Septal dev, mucus, polyps, erosion, perforation             Throat- Mallampati IV , mucosa clear ,  drainage- none, tonsils- atrophic, +missing teeth Neck- flexible , trachea midline, no stridor , thyroid nl, carotid no bruit Chest - symmetrical excursion , unlabored           Heart/CV- RRR , no murmur , no gallop  , no rub, nl s1 s2                           - JVD- none , edema- none, stasis changes- none, varices- none           Lung- clear to P&A, wheeze- none, cough- none , dullness-none, rub- none           Chest wall- sternotomy scar Abd-  Br/ Gen/ Rectal- Not done, not indicated Extrem- cyanosis- none, clubbing, none, atrophy- none, strength- nl. Vascular graft donor site scar left forearm. Neuro- grossly intact to observation

## 2015-07-06 NOTE — Assessment & Plan Note (Signed)
He remains too heavy. Discussed effort to lose weight.

## 2015-08-20 ENCOUNTER — Other Ambulatory Visit: Payer: Self-pay | Admitting: *Deleted

## 2015-08-20 MED ORDER — EPLERENONE 25 MG PO TABS: 25.0000 mg | ORAL_TABLET | Freq: Every day | ORAL | Status: DC

## 2015-08-31 ENCOUNTER — Ambulatory Visit (HOSPITAL_BASED_OUTPATIENT_CLINIC_OR_DEPARTMENT_OTHER): Payer: Medicare Other

## 2015-09-24 ENCOUNTER — Other Ambulatory Visit: Payer: Self-pay | Admitting: Cardiology

## 2015-09-24 NOTE — Telephone Encounter (Signed)
Rx(s) sent to pharmacy electronically.  

## 2015-09-26 ENCOUNTER — Telehealth: Payer: Self-pay | Admitting: Cardiology

## 2015-09-26 MED ORDER — TORSEMIDE 20 MG PO TABS: 20.0000 mg | ORAL_TABLET | Freq: Two times a day (BID) | ORAL | 1 refills | Status: DC

## 2015-09-26 NOTE — Telephone Encounter (Signed)
Spoke with pt, Follow up scheduled and refill sent to the pharmacy.

## 2015-09-26 NOTE — Telephone Encounter (Signed)
°*  STAT* If patient is at the pharmacy, call can be transferred to refill team.   1. Which medications need to be refilled? (please list name of each medication and dose if known) Demiex   2. Which pharmacy/location (including street and city if local pharmacy) is medication to be sent to? [facility] on [address]. Do they need a 30 day or 90 day supply? [place]

## 2015-10-11 ENCOUNTER — Encounter (HOSPITAL_BASED_OUTPATIENT_CLINIC_OR_DEPARTMENT_OTHER): Payer: Medicare Other

## 2015-10-18 ENCOUNTER — Ambulatory Visit (INDEPENDENT_AMBULATORY_CARE_PROVIDER_SITE_OTHER): Payer: Medicare Other | Admitting: Physician Assistant

## 2015-10-18 ENCOUNTER — Encounter: Payer: Self-pay | Admitting: Physician Assistant

## 2015-10-18 VITALS — BP 120/60 | HR 80 | Ht 71.0 in | Wt 287.0 lb

## 2015-10-18 DIAGNOSIS — I5032 Chronic diastolic (congestive) heart failure: Secondary | ICD-10-CM | POA: Diagnosis not present

## 2015-10-18 DIAGNOSIS — R0609 Other forms of dyspnea: Secondary | ICD-10-CM

## 2015-10-18 DIAGNOSIS — M79606 Pain in leg, unspecified: Secondary | ICD-10-CM

## 2015-10-18 MED ORDER — LOSARTAN POTASSIUM 50 MG PO TABS: 25.0000 mg | ORAL_TABLET | Freq: Every day | ORAL | 11 refills | Status: DC

## 2015-10-18 MED ORDER — TORSEMIDE 20 MG PO TABS: 20.0000 mg | ORAL_TABLET | Freq: Two times a day (BID) | ORAL | 11 refills | Status: DC

## 2015-10-18 MED ORDER — POTASSIUM CHLORIDE CRYS ER 20 MEQ PO TBCR: 20.0000 meq | EXTENDED_RELEASE_TABLET | Freq: Every day | ORAL | 11 refills | Status: DC

## 2015-10-18 MED ORDER — NITROGLYCERIN 0.4 MG SL SUBL: 0.4000 mg | SUBLINGUAL_TABLET | SUBLINGUAL | 11 refills | Status: DC | PRN

## 2015-10-18 MED ORDER — METOPROLOL TARTRATE 50 MG PO TABS: 50.0000 mg | ORAL_TABLET | Freq: Every day | ORAL | 11 refills | Status: DC

## 2015-10-18 MED ORDER — ROSUVASTATIN CALCIUM 20 MG PO TABS: 20.0000 mg | ORAL_TABLET | Freq: Every evening | ORAL | 11 refills | Status: DC

## 2015-10-18 MED ORDER — EPLERENONE 25 MG PO TABS: 25.0000 mg | ORAL_TABLET | Freq: Every day | ORAL | 11 refills | Status: DC

## 2015-10-18 NOTE — Progress Notes (Signed)
Cardiology Office Note   Date:  10/18/2015   ID:  [name], DOB 1955/04/15, MRN [id number]  PCP:  Antony Blackbird, MD  Cardiologist:  Dr Pedro Earls, Suanne Marker, PA-C   History of Present Illness: [name] [name] is a 60 y.o. male with a history of MI, CABG 2005 w/ LIMA-LAD, L rad-OM2, SVG-PDA, SVG-RI lat sub-branch-SVG-RI mid sub-branch, HTN, HLD, DM, OSA, pancreatitis, remote tob/ETOH use, morbid obesity, MV 2015 was normal, not gated; EF 50-55% by echo 2012  Saw Dr Annamaria Boots 06/2015, O2 2lpm qhs, not on CPAP, DOE felt 2nd deconditioning, obesity, cardiac status  NEHAN BROCKS presents for routine evaluation and follow-up.  In some ways, Mr. Wirtanen has been doing very well. He is able to use a push on his yard only having to stop once. He is short of breath climbing stairs, but this has not changed in the last 6 months or so. He is able to walk on flat ground for 30 minutes at a time, and does this on a regular basis.  He admits to putting salt on everything. He drinks a great deal of soda. He no longer eats fried foods and is not making some of the more fat laden dishes that he used to make. He quit smoking at the time of his bypass surgery, and has not started back. He quit drinking in 1987 and has not started back.  He is concerned about his lower extremity edema. It is worse on the right than on the left. The right lower extremity is the one where things were taken for his bypass surgery. He admits that he tends to fall asleep on the couch, with his legs down. He usually ends up sleeping there all night long. He doesn't think that he has problems lying flat to sleep, he just feels like he falls asleep on the couch and never gets up. He admits that if he elevates his legs which he used to do in a recliner, the edema is much less.  After a while walking, he has L>R lower extremity pain, but this is not new and has not changed recently. He does not have pain in his feet, numbness or  tingling.   Past Medical History:  Diagnosis Date  . Acute myocardial infarction, unspecified site, episode of care unspecified 2005  . Acute pancreatitis   . Bronchitis, not specified as acute or chronic   . CAD (coronary artery disease)    status post CABG in 2005 w LIMA to LAD, left radial to second circumflex marginal, saphenous vein graft to PDA, saphenous vein graft to lateral subbrach of ramus intermediate, and sequential saphenous vein graft to the medial subbranch of ramus intermediate  . Cirrhosis of liver without mention of alcohol   . Complications affecting other specified body systems, hypertension   . Other and unspecified hyperlipidemia   . Type II or unspecified type diabetes mellitus without mention of complication, not stated as uncontrolled   . Unspecified sleep apnea     Past Surgical History:  Procedure Laterality Date  . CORONARY ARTERY BYPASS GRAFT    . ENT surgery      Current Outpatient Prescriptions  Medication Sig Dispense Refill  . albuterol (PROVENTIL HFA;VENTOLIN HFA) 108 (90 Base) MCG/ACT inhaler Inhale 2 puffs into the lungs every 6 (six) hours as needed for wheezing or shortness of breath. 1 Inhaler 12  . albuterol (PROVENTIL) (2.5 MG/3ML) 0.083% nebulizer solution Take 3 mLs (2.5 mg total) by  nebulization every 6 (six) hours as needed for wheezing or shortness of breath. 75 mL prn  . ALPRAZolam (XANAX) 0.25 MG tablet Take 1 tablet (0.25 mg total) by mouth 3 (three) times daily as needed for sleep (for anxiety). 20 tablet 5  . aspirin EC 325 MG tablet Take 325 mg by mouth daily.      Marland Kitchen eplerenone (INSPRA) 25 MG tablet Take 1 tablet (25 mg total) by mouth daily. PLEASE CONTACT OFFICE FOR ADDITIONAL REFILLS 2ND ATTEMPT 15 tablet 0  . fluticasone (FLONASE) 50 MCG/ACT nasal spray Place 1 spray into both nostrils daily. 16 g 12  . HUMALOG KWIKPEN 200 UNIT/ML SOPN Inject 10-30 Units into the skin 3 (three) times daily with meals. 20 units with smaller meals  30 units with regular meals and 10 units with snacks  0  . ipratropium (ATROVENT) 0.02 % nebulizer solution Take 2.5 mLs (0.5 mg total) by nebulization every 6 (six) hours as needed for wheezing or shortness of breath. 75 mL prn  . JARDIANCE 25 MG TABS Take 25 mg by mouth daily.     Marland Kitchen LANTUS SOLOSTAR 100 UNIT/ML Solostar Pen Inject 70 Units into the skin every morning.   0  . losartan (COZAAR) 50 MG tablet Take 25 mg by mouth daily.    . metoprolol (LOPRESSOR) 50 MG tablet take 1 tablet by mouth once daily 90 tablet 3  . nitroGLYCERIN (NITROSTAT) 0.4 MG SL tablet Place 1 tablet (0.4 mg total) under the tongue every 5 (five) minutes as needed. 25 tablet 3  . potassium chloride SA (K-DUR,KLOR-CON) 20 MEQ tablet Take 1 tablet (20 mEq total) by mouth daily. (Patient not taking: Reported on 07/06/2015) 30 tablet 11  . rosuvastatin (CRESTOR) 20 MG tablet Take 20 mg by mouth every evening.  0  . torsemide (DEMADEX) 20 MG tablet Take 1 tablet (20 mg total) by mouth 2 (two) times daily. 60 tablet 1   No current facility-administered medications for this visit.     Allergies:   Ibuprofen    Social History:  The patient  reports that he quit smoking about 11 years ago. He does not have any smokeless tobacco history on file. He reports that he drinks alcohol.   Family History:  The patient's family history includes Breast cancer in his mother; Coronary artery disease in his father; Prostate cancer in his father.    ROS:  Please see the history of present illness. All other systems are reviewed and negative.    PHYSICAL EXAM: VS:  BP 120/60   Pulse 80   Ht 5\' 11"  (1.803 m)   Wt 287 lb (130.2 kg)   BMI 40.03 kg/m  , BMI Body mass index is 40.03 kg/m. GEN: Well nourished, well developed, male in no acute distress  HEENT: normal for age  Neck: no JVD seen but difficult to assess secondary to body habitus, no carotid bruit, no masses Cardiac: RRR; soft murmur, no rubs, or gallops Respiratory:   Decreased breath sounds bases bilaterally, normal work of breathing GI: soft, nontender, nondistended, + BS MS: no deformity or atrophy; R>L lower extremity edema; distal pulses are 2+ in both upper extremities, decreased in both lower extremities but both feet are warm and capillary refill is present but delayed   Skin: warm and dry, no rash Neuro:  Strength and sensation are intact Psych: euthymic mood, full affect   EKG:  EKG is not ordered today.   Recent Labs: 02/25/2015: B Natriuretic Peptide 54.3; BUN  16; Creatinine, Ser 1.44; Hemoglobin 15.7; Magnesium 1.7; Platelets 200; Potassium 3.6; Sodium 137    Lipid Panel    Component Value Date/Time   CHOL 154 01/08/2010 0837   TRIG 166.0 (H) 01/08/2010 0837   HDL 40.90 01/08/2010 0837   CHOLHDL 4 01/08/2010 0837   VLDL 33.2 01/08/2010 0837   LDLCALC 80 01/08/2010 0837     Wt Readings from Last 3 Encounters:  10/18/15 287 lb (130.2 kg)  07/06/15 292 lb 12.8 oz (132.8 kg)  10/20/14 285 lb (129.3 kg)     Other studies Reviewed: Additional studies/ records that were reviewed today include: Office notes and testing.  ASSESSMENT AND PLAN:  1.  Chronic diastolic CHF: His weight is stable. His symptoms do not seem to have been greatly changed. At this time, I am encouraging dietary compliance with reducing his sodium intake and reducing his soda intake. He is also encouraged to get a recliner or other means to elevate his legs. He thinks that there is a low likelihood of him not continuing to follow sleep in front of the TV. He is encouraged to track his weight at home. If his symptoms worsen or sore extremity edema worsens, he is to contact us. For now, no medication changes. We are happy to refill all of his cardiac medications.  2. Leg pain with ambulation, possible PAD: He has decreased lower extremity pulses although the edema may contribute to this. He feels that his symptoms are baseline. He had ABIs ordered in 2015 that were  never completed. Follow for change in symptoms.  3. Dyspnea on exertion: His symptoms seem at baseline. Most of his volume overload is in his lower extremities. His neck veins are difficult to see because of his body habitus, but he does not have a lot of congestion in his lungs. Continue low-dose Demadex and follow up with Dr. Annamaria Boots.   Current medicines are reviewed at length with the patient today.  The patient has concerns regarding medicines.  The following changes have been made:  Refills on cardiac medications were put in  Labs/ tests ordered today include:  No orders of the defined types were placed in this encounter. His labs are followed by his primary care physician and by his diabetes doctor, we will obtain copies   Disposition:   FU with Dr Percival Spanish  Signed, Rosaria Ferries, PA-C  10/18/2015 1:43 PM    [place] Phone: [phone]; Fax: [phone]  This note was written with the assistance of speech recognition software. Please excuse any transcriptional errors.

## 2015-10-18 NOTE — Patient Instructions (Signed)
Elevate feet as much as possible while sitting  Low sodium diet  No fried foods  Decrease or stop soda's  Continue walking   Your physician wants you to follow-up in: 1 year with Dr.Hochrein. Low-Sodium Eating Plan Sodium raises blood pressure and causes water to be held in the body. Getting less sodium from food will help lower your blood pressure, reduce any swelling, and protect your heart, liver, and kidneys. We get sodium by adding salt (sodium chloride) to food. Most of our sodium comes from canned, boxed, and frozen foods. Restaurant foods, fast foods, and pizza are also very high in sodium. Even if you take medicine to lower your blood pressure or to reduce fluid in your body, getting less sodium from your food is important. WHAT IS MY PLAN? Most people should limit their sodium intake to 2,300 mg a day. Your health care provider recommends that you limit your sodium intake to __________ a day.  WHAT DO I NEED TO KNOW ABOUT THIS EATING PLAN? For the low-sodium eating plan, you will follow these general guidelines:  Choose foods with a % Daily Value for sodium of less than 5% (as listed on the food label).   Use salt-free seasonings or herbs instead of table salt or sea salt.   Check with your health care provider or pharmacist before using salt substitutes.   Eat fresh foods.  Eat more vegetables and fruits.  Limit canned vegetables. If you do use them, rinse them well to decrease the sodium.   Limit cheese to 1 oz (28 g) per day.   Eat lower-sodium products, often labeled as "lower sodium" or "no salt added."  Avoid foods that contain monosodium glutamate (MSG). MSG is sometimes added to [place] food and some canned foods.  Check food labels (Nutrition Facts labels) on foods to learn how much sodium is in one serving.  Eat more home-cooked food and less restaurant, buffet, and fast food.  When eating at a restaurant, ask that your food be prepared with less  salt, or no salt if possible.  HOW DO I READ FOOD LABELS FOR SODIUM INFORMATION? The Nutrition Facts label lists the amount of sodium in one serving of the food. If you eat more than one serving, you must multiply the listed amount of sodium by the number of servings. Food labels may also identify foods as:  Sodium free--Less than 5 mg in a serving.  Very low sodium--35 mg or less in a serving.  Low sodium--140 mg or less in a serving.  Light in sodium--50% less sodium in a serving. For example, if a food that usually has 300 mg of sodium is changed to become light in sodium, it will have 150 mg of sodium.  Reduced sodium--25% less sodium in a serving. For example, if a food that usually has 400 mg of sodium is changed to reduced sodium, it will have 300 mg of sodium. WHAT FOODS CAN I EAT? Grains Low-sodium cereals, including oats, puffed wheat and rice, and shredded wheat cereals. Low-sodium crackers. Unsalted rice and pasta. Lower-sodium bread.  Vegetables Frozen or fresh vegetables. Low-sodium or reduced-sodium canned vegetables. Low-sodium or reduced-sodium tomato sauce and paste. Low-sodium or reduced-sodium tomato and vegetable juices.  Fruits Fresh, frozen, and canned fruit. Fruit juice.  Meat and Other Protein Products Low-sodium canned tuna and salmon. Fresh or frozen meat, poultry, seafood, and fish. Lamb. Unsalted nuts. Dried beans, peas, and lentils without added salt. Unsalted canned beans. Homemade soups without salt. Eggs.  Dairy Milk. Soy milk. Ricotta cheese. Low-sodium or reduced-sodium cheeses. Yogurt.  Condiments Fresh and dried herbs and spices. Salt-free seasonings. Onion and garlic powders. Low-sodium varieties of mustard and ketchup. Fresh or refrigerated horseradish. Lemon juice.  Fats and Oils Reduced-sodium salad dressings. Unsalted butter.  Other Unsalted popcorn and pretzels.  The items listed above may not be a complete list of recommended foods  or beverages. Contact your dietitian for more options. WHAT FOODS ARE NOT RECOMMENDED? Grains Instant hot cereals. Bread stuffing, pancake, and biscuit mixes. Croutons. Seasoned rice or pasta mixes. Noodle soup cups. Boxed or frozen macaroni and cheese. Self-rising flour. Regular salted crackers. Vegetables Regular canned vegetables. Regular canned tomato sauce and paste. Regular tomato and vegetable juices. Frozen vegetables in sauces. Salted [place] fries. Olives. Angie Fava. Relishes. Sauerkraut. Salsa. Meat and Other Protein Products Salted, canned, smoked, spiced, or pickled meats, seafood, or fish. Bacon, ham, sausage, hot dogs, corned beef, chipped beef, and packaged luncheon meats. Salt pork. Jerky. Pickled herring. Anchovies, regular canned tuna, and sardines. Salted nuts. Dairy Processed cheese and cheese spreads. Cheese curds. Blue cheese and cottage cheese. Buttermilk.  Condiments Onion and garlic salt, seasoned salt, table salt, and sea salt. Canned and packaged gravies. Worcestershire sauce. Tartar sauce. Barbecue sauce. Teriyaki sauce. Soy sauce, including reduced sodium. Steak sauce. Fish sauce. Oyster sauce. Cocktail sauce. Horseradish that you find on the shelf. Regular ketchup and mustard. Meat flavorings and tenderizers. Bouillon cubes. Hot sauce. Tabasco sauce. Marinades. Taco seasonings. Relishes. Fats and Oils Regular salad dressings. Salted butter. Margarine. Ghee. Bacon fat.  Other Potato and tortilla chips. Corn chips and puffs. Salted popcorn and pretzels. Canned or dried soups. Pizza. Frozen entrees and pot pies.  The items listed above may not be a complete list of foods and beverages to avoid. Contact your dietitian for more information.   This information is not intended to replace advice given to you by your health care provider. Make sure you discuss any questions you have with your health care provider.   Document Released: 07/19/2001 Document Revised:  02/17/2014 Document Reviewed: 12/01/2012 Elsevier Interactive Patient Education 2016 [place] will receive a reminder letter in the mail two months in advance. If you don't receive a letter, please call our office to schedule the follow-up appointment.

## 2015-11-06 ENCOUNTER — Ambulatory Visit: Payer: Medicare Other | Admitting: Internal Medicine

## 2015-12-13 ENCOUNTER — Telehealth: Payer: Self-pay

## 2015-12-13 DIAGNOSIS — I2510 Atherosclerotic heart disease of native coronary artery without angina pectoris: Secondary | ICD-10-CM

## 2015-12-13 NOTE — Telephone Encounter (Signed)
Patient called Dr.Jordan received lab work from your PCP.He advised you need to have cmet and lipid panel.Advised you can have done any day next week at [facility] lab.Lab orders mailed to patient.

## 2016-03-24 ENCOUNTER — Ambulatory Visit (INDEPENDENT_AMBULATORY_CARE_PROVIDER_SITE_OTHER): Payer: Medicare Other | Admitting: Family Medicine

## 2016-03-24 ENCOUNTER — Encounter: Payer: Self-pay | Admitting: Family Medicine

## 2016-03-24 VITALS — BP 106/62 | HR 79 | Temp 97.5°F | Ht 68.01 in | Wt 287.0 lb

## 2016-03-24 DIAGNOSIS — I5032 Chronic diastolic (congestive) heart failure: Secondary | ICD-10-CM | POA: Diagnosis not present

## 2016-03-24 DIAGNOSIS — R609 Edema, unspecified: Secondary | ICD-10-CM

## 2016-03-24 DIAGNOSIS — E1149 Type 2 diabetes mellitus with other diabetic neurological complication: Secondary | ICD-10-CM | POA: Diagnosis not present

## 2016-03-24 DIAGNOSIS — IMO0001 Reserved for inherently not codable concepts without codable children: Secondary | ICD-10-CM

## 2016-03-24 DIAGNOSIS — G4733 Obstructive sleep apnea (adult) (pediatric): Secondary | ICD-10-CM

## 2016-03-24 DIAGNOSIS — I25708 Atherosclerosis of coronary artery bypass graft(s), unspecified, with other forms of angina pectoris: Secondary | ICD-10-CM | POA: Diagnosis not present

## 2016-03-24 DIAGNOSIS — R600 Localized edema: Secondary | ICD-10-CM | POA: Diagnosis not present

## 2016-03-24 DIAGNOSIS — G63 Polyneuropathy in diseases classified elsewhere: Secondary | ICD-10-CM

## 2016-03-24 DIAGNOSIS — I10 Essential (primary) hypertension: Secondary | ICD-10-CM

## 2016-03-24 DIAGNOSIS — I213 ST elevation (STEMI) myocardial infarction of unspecified site: Secondary | ICD-10-CM | POA: Diagnosis not present

## 2016-03-24 DIAGNOSIS — M792 Neuralgia and neuritis, unspecified: Secondary | ICD-10-CM

## 2016-03-24 DIAGNOSIS — Z794 Long term (current) use of insulin: Secondary | ICD-10-CM

## 2016-03-24 DIAGNOSIS — I739 Peripheral vascular disease, unspecified: Secondary | ICD-10-CM | POA: Diagnosis not present

## 2016-03-24 DIAGNOSIS — K859 Acute pancreatitis without necrosis or infection, unspecified: Secondary | ICD-10-CM | POA: Diagnosis not present

## 2016-03-24 DIAGNOSIS — K7030 Alcoholic cirrhosis of liver without ascites: Secondary | ICD-10-CM | POA: Diagnosis not present

## 2016-03-24 LAB — COMPREHENSIVE METABOLIC PANEL
ALT: 10 U/L (ref 9–46)
AST: 17 U/L (ref 10–35)
Albumin: 3.9 g/dL (ref 3.6–5.1)
Alkaline Phosphatase: 80 U/L (ref 40–115)
BUN: 16 mg/dL (ref 7–25)
CO2: 25 mmol/L (ref 20–31)
Calcium: 8.6 mg/dL (ref 8.6–10.3)
Chloride: 98 mmol/L (ref 98–110)
Creat: 1.23 mg/dL (ref 0.70–1.25)
Glucose, Bld: 167 mg/dL — ABNORMAL HIGH (ref 65–99)
Potassium: 3.2 mmol/L — ABNORMAL LOW (ref 3.5–5.3)
Sodium: 136 mmol/L (ref 135–146)
Total Bilirubin: 0.6 mg/dL (ref 0.2–1.2)
Total Protein: 8.5 g/dL — ABNORMAL HIGH (ref 6.1–8.1)

## 2016-03-24 MED ORDER — NITROGLYCERIN 0.4 MG SL SUBL: 0.4000 mg | SUBLINGUAL_TABLET | SUBLINGUAL | 11 refills | Status: DC | PRN

## 2016-03-24 NOTE — Progress Notes (Signed)
Pre visit review using our clinic review tool, if applicable. No additional management support is needed unless otherwise documented below in the visit note. 

## 2016-03-24 NOTE — Progress Notes (Signed)
[name] [name] is a 61 y.o. male is here to [facility].   History of Present Illness:    1. Edema, unspecified type. Increased swelling, abdomen. Mild edema legs - stable. No increased CP, SOB.    2. Polyneuropathy associated with underlying disease ([place]). Pain in extremities, numbness with tingling. On no medications for this.   3. Radicular pain in left arm. Left ulnar distribution of hand. Some pain in medial elbow. Known pathology in neck.    4. OSA (obstructive sleep apnea). Not on CPAP.      PMHx, SurgHx, SocialHx, Medications, and Allergies were reviewed in the Visit Navigator and updated as appropriate.    Past Medical History:  Diagnosis Date  . Acute myocardial infarction, unspecified site, episode of care unspecified 2005  . Acute pancreatitis   . Bronchitis, not specified as acute or chronic   . CAD (coronary artery disease)    status post CABG in 2005 w LIMA to LAD, left radial to second circumflex marginal, saphenous vein graft to PDA, saphenous vein graft to lateral subbrach of ramus intermediate, and sequential saphenous vein graft to the medial subbranch of ramus intermediate  . Cirrhosis of liver without mention of alcohol   . Complications affecting other specified body systems, hypertension   . Other and unspecified hyperlipidemia   . Sleep apnea   . Type II or unspecified type diabetes mellitus without mention of complication, not stated as uncontrolled   . Unspecified sleep apnea     Past Surgical History:  Procedure Laterality Date  . CORONARY ARTERY BYPASS GRAFT    . ENT surgery      Family History  Problem Relation Age of Onset  . Breast cancer Mother   . Prostate cancer Father   . Coronary artery disease Father     underwent CABG    Social History  Substance Use Topics  . Smoking status: Former Smoker    Quit date: 10/20/2004  . Smokeless tobacco: Never Used  . Alcohol use Yes     Comment: occasional     Current Medications  and Allergies:    Current Outpatient Prescriptions:  .  ACCU-CHEK AVIVA PLUS test strip, , Disp: , Rfl:  .  albuterol (PROVENTIL HFA;VENTOLIN HFA) 108 (90 Base) MCG/ACT inhaler, Inhale 2 puffs into the lungs every 6 (six) hours as needed for wheezing or shortness of breath., Disp: 1 Inhaler, Rfl: 12 .  albuterol (PROVENTIL) (2.5 MG/3ML) 0.083% nebulizer solution, Take 3 mLs (2.5 mg total) by nebulization every 6 (six) hours as needed for wheezing or shortness of breath., Disp: 75 mL, Rfl: prn .  aspirin EC 325 MG tablet, Take 325 mg by mouth daily.  , Disp: , Rfl:  .  B-D ULTRAFINE III SHORT PEN 31G X 8 MM MISC, , Disp: , Rfl: 1 .  eplerenone (INSPRA) 25 MG tablet, Take 1 tablet (25 mg total) by mouth daily., Disp: 30 tablet, Rfl: 11 .  fluticasone (FLONASE) 50 MCG/ACT nasal spray, Place 1 spray into both nostrils daily., Disp: 16 g, Rfl: 12 .  HUMALOG KWIKPEN 200 UNIT/ML SOPN, Inject 10-30 Units into the skin 3 (three) times daily with meals. 20 units with smaller meals 30 units with regular meals and 10 units with snacks. Sliding Scale, Disp: , Rfl: 0 .  ipratropium (ATROVENT) 0.02 % nebulizer solution, Take 2.5 mLs (0.5 mg total) by nebulization every 6 (six) hours as needed for wheezing or shortness of breath., Disp: 75 mL, Rfl: prn .  JARDIANCE 25 MG TABS, Take 25 mg by mouth daily. , Disp: , Rfl:  .  Lancets (ACCU-CHEK MULTICLIX) lancets, , Disp: , Rfl:  .  LANTUS SOLOSTAR 100 UNIT/ML Solostar Pen, Inject 70 Units into the skin every morning. , Disp: , Rfl: 0 .  losartan (COZAAR) 25 MG tablet, Take 25 mg by mouth daily., Disp: , Rfl: 0 .  metoprolol (LOPRESSOR) 50 MG tablet, Take 1 tablet (50 mg total) by mouth daily., Disp: 30 tablet, Rfl: 11 .  nitroGLYCERIN (NITROSTAT) 0.4 MG SL tablet, Place 1 tablet (0.4 mg total) under the tongue every 5 (five) minutes as needed., Disp: 25 tablet, Rfl: 11 .  nystatin cream (MYCOSTATIN), 1 application daily as needed., Disp: , Rfl: 0 .  potassium  chloride SA (K-DUR,KLOR-CON) 20 MEQ tablet, Take 1 tablet (20 mEq total) by mouth daily., Disp: 30 tablet, Rfl: 11 .  rosuvastatin (CRESTOR) 20 MG tablet, Take 1 tablet (20 mg total) by mouth every evening., Disp: 30 tablet, Rfl: 11 .  torsemide (DEMADEX) 20 MG tablet, Take 1 tablet (20 mg total) by mouth 2 (two) times daily., Disp: 60 tablet, Rfl: 11   Allergies  Allergen Reactions  . Ibuprofen Other (See Comments)    Stomach pain      Patient Information Form: Screening and ROS    Do you feel safe in relationships? yes PHQ-2: negative  Review of Systems  General:  Negative for unexplained weight loss, fever Skin: Negative for new or changing mole, sore that won't heal HEENT: Negative for trouble hearing, trouble seeing, ringing in ears, mouth sores, hoarseness, change in voice, dysphagia CV:  Negative for chest pain, palpitations Resp: Negative for cough, dyspnea, hemoptysis GI: Negative for nausea, vomiting, diarrhea, constipation, abdominal pain, melena, hematochezia GU: Negative for dysuria, incontinence, urinary hesitance, hematuria, vaginal or penile discharge, polyuria MSK: Negative for muscle cramps or aches, joint pain or swelling Neuro: Negative for headaches, weakness, numbness, dizziness, passing out/fainting Psych: Negative for depression, anxiety, memory problems   Vitals:   Vitals:   03/24/16 1357  BP: 106/62  Pulse: 79  Temp: 97.5 F (36.4 C)  TempSrc: Oral  SpO2: 94%  Weight: 287 lb (130.2 kg)  Height: 5' 8.01" (1.728 m)     Body mass index is 43.62 kg/m.   Physical Exam:    General: Alert, cooperative, appears stated age and no distress.  HEENT:  Normocephalic, without obvious abnormality, atraumatic. Conjunctivae/corneas clear. PERRL, EOM's intact. Normal TM's and external ear canals both ears. Nares normal. Septum midline. Mucosa normal. No drainage or sinus tenderness. Lips, mucosa, and tongue normal; teeth and gums normal.  Lungs: Clear to  auscultation bilaterally.  Heart:: Regular rate and rhythm, S1, S2 normal, no murmur, click, rub or gallop.  Abdomen: Protuberant. Non tender.  Extremities: Extremities normal, atraumatic, no cyanosis or edema.  Pulses: 2+ and symmetric.  Skin: Skin color, texture, turgor normal. No rashes or lesions.  Neurologic: Alert and oriented X 3, normal strength and tone. Normal symmetric. reflexes. Normal coordination and gait.  Psych: Alert,oriented, in NAD with a full range of affect, normal behavior and no psychotic features      Assessment and Plan:    [place] was seen today for establish care.  Diagnoses and all orders for this visit:  Edema, unspecified type Comments: Worsened. Labs pending. Orders: -     Comprehensive metabolic panel -     Brain natriuretic peptide  Polyneuropathy associated with underlying disease ([place]) Comments: Peripheral Neuropathy. DMII. Checking labs,  requesting previous records. PVD mild 2016. Considering Neurontin. Orders: -     Comprehensive metabolic panel  Radicular pain in left arm Comments: Radiation to left lateral hand. Known cervical spine pathology. Right handed.  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  Obstructive sleep apnea Comments: Referral to titrate and restart CPAP.   Alcoholic cirrhosis of liver without ascites (HCC) Comments: Hx of. Abstinent from ETOH for many years.   Coronary artery disease of bypass graft of native heart with stable angina pectoris (H[facility]) -     nitroGLYCERIN (NITROSTAT) 0.4 MG SL tablet; Place 1 tablet (0.4 mg total) under the tongue every 5 (five) minutes as needed.  Essential hypertension  PVD (peripheral vascular disease) (HCC)  Diastolic CHF, chronic (HCC)  Acute ST elevation myocardial infarction (STEMI),  unspecified artery (HCC)  Acute pancreatitis without infection or necrosis, unspecified pancreatitis type   . Reviewed expectations re: course of current medical issues. . Discussed self-management of symptoms. . Outlined signs and symptoms indicating need for more acute intervention. . Patient verbalized understanding and all questions were answered. . See orders for this visit as documented in the electronic medical record. . Patient received an After Visit Summary.  Records requested if needed. I spent 60 minutes with this patient, greater than 50% was face-to-face time counseling regarding the above diagnoses.    Briscoe Deutscher, [place], [place] 03/24/2016   Follow-up: No Follow-up on file.  Meds ordered this encounter  Medications  . ACCU-CHEK AVIVA PLUS test strip  . B-D ULTRAFINE III SHORT PEN 31G X 8 MM MISC    Refill:  1  . Lancets (ACCU-CHEK MULTICLIX) lancets  . losartan (COZAAR) 25 MG tablet    Sig: Take 25 mg by mouth daily.    Refill:  0  . nitroGLYCERIN (NITROSTAT) 0.4 MG SL tablet    Sig: Place 1 tablet (0.4 mg total) under the tongue every 5 (five) minutes as needed.    Dispense:  25 tablet    Refill:  11   Medications Discontinued During This Encounter  Medication Reason  . losartan (COZAAR) 50 MG tablet   . ALPRAZolam (XANAX) 0.25 MG tablet   . nitroGLYCERIN (NITROSTAT) 0.4 MG SL tablet Reorder   Orders Placed This Encounter  Procedures  . Comprehensive metabolic panel  . Brain natriuretic peptide  . AMB referral to sports medicine  . Nocturnal polysomnography (NPSG)

## 2016-03-25 LAB — BRAIN NATRIURETIC PEPTIDE: Brain Natriuretic Peptide: 30.3 pg/mL (ref <100)

## 2016-03-27 ENCOUNTER — Ambulatory Visit: Payer: Self-pay

## 2016-03-27 ENCOUNTER — Ambulatory Visit (INDEPENDENT_AMBULATORY_CARE_PROVIDER_SITE_OTHER): Payer: Medicare Other | Admitting: Sports Medicine

## 2016-03-27 ENCOUNTER — Telehealth: Payer: Self-pay | Admitting: Sports Medicine

## 2016-03-27 ENCOUNTER — Encounter: Payer: Self-pay | Admitting: Sports Medicine

## 2016-03-27 VITALS — BP 108/60 | HR 77 | Ht 68.0 in | Wt 291.2 lb

## 2016-03-27 DIAGNOSIS — E1149 Type 2 diabetes mellitus with other diabetic neurological complication: Secondary | ICD-10-CM

## 2016-03-27 DIAGNOSIS — M79602 Pain in left arm: Secondary | ICD-10-CM | POA: Diagnosis not present

## 2016-03-27 DIAGNOSIS — M4722 Other spondylosis with radiculopathy, cervical region: Secondary | ICD-10-CM | POA: Diagnosis not present

## 2016-03-27 DIAGNOSIS — G5622 Lesion of ulnar nerve, left upper limb: Secondary | ICD-10-CM

## 2016-03-27 DIAGNOSIS — Z794 Long term (current) use of insulin: Secondary | ICD-10-CM

## 2016-03-27 DIAGNOSIS — M792 Neuralgia and neuritis, unspecified: Secondary | ICD-10-CM | POA: Diagnosis not present

## 2016-03-27 DIAGNOSIS — IMO0001 Reserved for inherently not codable concepts without codable children: Secondary | ICD-10-CM

## 2016-03-27 MED ORDER — GABAPENTIN 300 MG PO CAPS: ORAL_CAPSULE | ORAL | 1 refills | Status: DC

## 2016-03-27 NOTE — Assessment & Plan Note (Signed)
He actually has a subluxing/dislocating ulnar nerve at the cubital tunnel.  He has previously undergone a ulnar nerve transposition on the right and understands this would be the recommended procedure if he was interested in surgery but he would like to defer this as long as possible.  I do suspect there is some component of double crush as stated below and this will need to be watched closely.  Trial of gabapentin.

## 2016-03-27 NOTE — Assessment & Plan Note (Signed)
The patient does have a history of cervical spondylosis with radiculitis and I suspect he is actually having some double crush symptoms with this.  He would like to defer any type of steroid treatment at this time but would likely be a candidate for Dosepaks and potential epidural steroid injections if worsening severe symptoms however he will need further imaging if any lack of improvement.

## 2016-03-27 NOTE — Progress Notes (Signed)
[name] [name] - 61 y.o. male MRN [id number]  Date of birth: 01-06-1956  Office Visit Note: Visit Date: 03/27/2016 PCP: Briscoe Deutscher, DO Referred by: Briscoe Deutscher, DO  Subjective: Chief Complaint  Patient presents with  . pain in left wrist    Pt c/o chronic left wrist pain since 2005. He had a similar problem on the right hand but they did surgery on his elbow and those sx have since resolved.    HPI: Patient was referred by Dr. Juleen China for evaluation of long-standing left hand discomfort.  He has had associated left shoulder and arm pain.  He had a prior surgery on the right elbow for ulnar nerve transposition due to similar symptoms.  He denies any pain in the first through third fingers.  No significant changes in grip strength.  No recent trauma.  He does report both the elbows were injured during a work-related incident.  He has undergone EMGs in the past reveal a significant ulnar nerve neuropathy from the elbow he is not interested in surgery at this time.  He is not use.  Previously has had steroid Dosepaks that were effective however he would like to avoid this due to his underlying medical conditions. ROS: Pt denies any change in bowel or bladder habits, muscle weakness, numbness or falls associated with this pain.  Otherwise per HPI.  Objective:  VS:  HT:5\' 8"  (172.7 cm)   WT:291 lb 3.2 oz (132.1 kg)  BMI:44.4    BP:108/60  HR:77bpm  TEMP: ( )  RESP:96 % Physical Exam: GENERAL: WDWN, NAD, Non-toxic appearing  PSYCH: Alert & Appropriately interactive Not depressed or anxious appearing   UPPER EXTREMITIES: SKIN: No significant rashes/lesions/ulcerations.  CV: No significant pitting edema. No clubbing or cyanosis. Radial pulses are 2+/4  NEURO: Sensation is intact to light touch Gross motor coordination is normal DTRs & Pathologic Reflexes  LEFT UE:  No significant scaring.  He does have a well-healed postsurgical incision along the right medial elbow from prior  ulnar nerve transposition.  Well aligned, no significant deformity  No effusion or swelling  TTP over: Medial elbow over the cubital tunnel with a positive Tinel's at this area.  No focal bony tenderness.  ROM: Full overhead range of motion of the shoulder with elbow flexion extension from: 3 to 140.  Cervical range of motion markedly limited in extension.  Upper extremity strength in all myotomes is 5 out of 5 he has pain with brachial plexus squeeze with a negative Spurling's compression test and Lhermitte's compression test.    Imaging & Procedures:  LIMITED MSK ULTRASOUND OF: LEFT ELBOW Images were obtained and interpreted by myself, Teresa Coombs, DO  Images have been saved and stored to PACS system. Images obtained on: GE S7 Ultrasound machine  FINDINGS: Flexion-extension views of the cubital tunnel were obtained today that showed a well-seated ulnar nerve in extension with subluxation/dislocation out of the cubital tunnel with flexion.  Nerve is also markedly swollen.  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  He does describe some potential early peripheral neuropathy symptoms that should be helped by gabapentin as well.      Radicular pain in left arm   Ulnar neuritis, left  He actually has a subluxing/dislocating ulnar nerve at the cubital tunnel.  He has previously undergone a ulnar nerve transposition on the right and understands this would be the recommended procedure if he was interested in surgery but he would like to defer this as long as possible.  I do suspect there is some component of double crush as stated below and this will need to be watched closely.  Trial of  gabapentin.      Relevant Medications   gabapentin (NEURONTIN) 300 MG capsule   Osteoarthritis of spine with radiculopathy, cervical region    The patient does have a history of cervical spondylosis with radiculitis and I suspect he is actually having some double crush symptoms with this.  He would like to defer any type of steroid treatment at this time but would likely be a candidate for Dosepaks and potential epidural steroid injections if worsening severe symptoms however he will need further imaging if any lack of improvement.         Other Visit Diagnoses    Left arm pain    -  Primary   Relevant Orders   [place] LIMITED JOINT SPACE STRUCTURES UP LEFT      Follow-up: Return in about 6 weeks (around 05/08/2016).   Past Medical/Family/Surgical/Social History: Medications & Allergies reviewed per EMR Patient Active Problem List   Diagnosis Date Noted  . Radicular pain in left arm 03/27/2016  . Ulnar neuritis, left 03/27/2016  . Osteoarthritis of spine with radiculopathy, cervical region 03/27/2016  . Diastolic CHF, chronic ([place]) 03/24/2016  . Edema 08/12/2010  . PVD (peripheral vascular disease) ([place]) 04/30/2009  . Morbid obesity ([place]) 12/26/2008  . 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 HLD (hyperlipidemia) 01/04/2007  . Acute MI 01/04/2007  . Bronchitis 01/04/2007  . Hepatic cirrhosis ([place]) 01/04/2007  . Pancreatitis 01/04/2007  . Obstructive sleep apnea 01/04/2007  . HTN (hypertension) 01/04/2007   Past Medical History:  Diagnosis Date  . Acute myocardial infarction, unspecified site, episode of care unspecified 2005  . Acute pancreatitis   . Bronchitis, not specified as acute or chronic   . CAD (coronary artery disease)    status post CABG in 2005 w LIMA to LAD, left radial to second circumflex marginal, saphenous vein graft to PDA, saphenous vein graft to lateral subbrach of ramus intermediate, and sequential saphenous  vein graft to the medial subbranch of ramus intermediate  . Cirrhosis of liver without mention of alcohol   . Complications affecting other specified body systems, hypertension   . Other and unspecified hyperlipidemia   . Sleep apnea   . Type II or unspecified type diabetes mellitus without mention of complication, not stated as uncontrolled   . Unspecified sleep apnea    Family History  Problem Relation Age of Onset  . Breast cancer Mother   . Prostate cancer Father   . Coronary artery disease Father     underwent CABG   Past Surgical History:  Procedure Laterality Date  . CORONARY ARTERY BYPASS GRAFT    . ENT surgery     Social History   Occupational History  . Not on file.   Social History Main Topics  . Smoking status: Former Smoker    Quit date: 10/20/2004  . Smokeless tobacco: Never Used  . Alcohol use Yes     Comment: occasional  . Drug use: No  . Sexual activity: Not on file

## 2016-03-27 NOTE — Assessment & Plan Note (Signed)
Patient has multiple medical conditions that a placed in a high risk for surgical intervention.  He does describe some potential early peripheral neuropathy symptoms that should be helped by gabapentin as well.

## 2016-03-27 NOTE — Telephone Encounter (Signed)
Pt called stating he read the side effects of the medication Dr. Paulla Fore prescribed him today (hallucinations) and he was wondering if Dr. Paulla Fore could prescribe him something different. He said he has taken "cortizone in pill form" and he was okay with that, please call pt when possible to discuss, MS

## 2016-03-28 ENCOUNTER — Other Ambulatory Visit: Payer: Self-pay

## 2016-03-28 MED ORDER — VITAMIN B-6 100 MG PO TABS: ORAL_TABLET | ORAL | Status: DC

## 2016-03-28 MED ORDER — METHYLPREDNISOLONE 4 MG PO TBPK: ORAL_TABLET | ORAL | 0 refills | Status: DC

## 2016-03-28 NOTE — Telephone Encounter (Signed)
It is okay to not take the gabapentin although I think this would be beneficial considering worth trying in the future.  I have sent a prescription for Medrol Dosepak and we can see if this will be beneficial for him.  Also please asked him to pick up vitamin B6 over-the-counter and have him take that on a daily basis to help with nerve health.

## 2016-03-28 NOTE — Telephone Encounter (Signed)
Will forward to Dr. Rigby to advise.  

## 2016-03-28 NOTE — Telephone Encounter (Signed)
Called pt and advised. Med list updated.  

## 2016-03-31 ENCOUNTER — Ambulatory Visit: Payer: Medicare Other | Admitting: Sports Medicine

## 2016-04-07 ENCOUNTER — Telehealth: Payer: Self-pay | Admitting: Surgical

## 2016-04-07 NOTE — Telephone Encounter (Signed)
-----   Message from Briscoe Deutscher, DO sent at 04/06/2016  8:34 PM EST ----- Please help me make sure patient has appt for CPE or follow up if not due within the next 1-2 months.

## 2016-04-07 NOTE — Telephone Encounter (Signed)
Scheduled patient for follow up on 05/26/16

## 2016-05-09 ENCOUNTER — Encounter (HOSPITAL_BASED_OUTPATIENT_CLINIC_OR_DEPARTMENT_OTHER): Payer: Medicare Other

## 2016-05-26 ENCOUNTER — Ambulatory Visit: Payer: Medicare Other | Admitting: Family Medicine

## 2016-06-10 ENCOUNTER — Telehealth: Payer: Self-pay | Admitting: *Deleted

## 2016-06-10 ENCOUNTER — Ambulatory Visit: Payer: Medicare Other | Admitting: Family Medicine

## 2016-06-10 NOTE — Telephone Encounter (Signed)
Called and spoke to patient regarding his AWV. Asked pt if he would like to stay after his 1:30 appt with Dr Juleen China to complete his AWV. He states he will ask his brother [name] [name] if he has another appt and if not he will call back to schedule. Please schedule AWV at 2:00 if pt calls back.

## 2016-06-18 ENCOUNTER — Encounter: Payer: Self-pay | Admitting: Family Medicine

## 2016-06-18 ENCOUNTER — Ambulatory Visit (INDEPENDENT_AMBULATORY_CARE_PROVIDER_SITE_OTHER): Payer: Medicare Other | Admitting: Family Medicine

## 2016-06-18 VITALS — BP 116/74 | HR 98 | Temp 98.0°F | Ht 68.0 in | Wt 289.4 lb

## 2016-06-18 DIAGNOSIS — E1149 Type 2 diabetes mellitus with other diabetic neurological complication: Secondary | ICD-10-CM

## 2016-06-18 DIAGNOSIS — IMO0001 Reserved for inherently not codable concepts without codable children: Secondary | ICD-10-CM

## 2016-06-18 DIAGNOSIS — Z0000 Encounter for general adult medical examination without abnormal findings: Secondary | ICD-10-CM | POA: Diagnosis not present

## 2016-06-18 DIAGNOSIS — M4722 Other spondylosis with radiculopathy, cervical region: Secondary | ICD-10-CM

## 2016-06-18 DIAGNOSIS — I10 Essential (primary) hypertension: Secondary | ICD-10-CM

## 2016-06-18 DIAGNOSIS — Z794 Long term (current) use of insulin: Secondary | ICD-10-CM | POA: Diagnosis not present

## 2016-06-18 LAB — POCT GLYCOSYLATED HEMOGLOBIN (HGB A1C): Hemoglobin A1C: 9.9

## 2016-06-18 MED ORDER — ZOSTER VAC RECOMB ADJUVANTED 50 MCG/0.5ML IM SUSR: 0.5000 mL | Freq: Once | INTRAMUSCULAR | 0 refills | Status: AC

## 2016-06-18 NOTE — Progress Notes (Signed)
[name] [name] is a 61 y.o. male is here for follow up.  History of Present Illness:   [name] [name] CMA acting as scribe for Dr. Juleen China.  CC: Patient comes in today for a follow up for his diabetes. He has been taking all medications like he should. He has been taking blood sugar readings. This AM blood sugar was 150. He is having some pain in both legs and feet t this time.   HPI:   1. Essential hypertension.  Patient is here to follow up on hypertension. Home blood pressure readings at goal. Avoiding excessive salt intake. Trying to exercise on a regular basis. Denies chest pain. Taking medications as prescribed without side effects.   Wt Readings from Last 3 Encounters:  06/18/16 289 lb 6.4 oz (131.3 kg)  03/27/16 291 lb 3.2 oz (132.1 kg)  03/24/16 287 lb (130.2 kg)    reports that he quit smoking about 11 years ago. He has never used smokeless tobacco. BP Readings from Last 3 Encounters:  06/18/16 116/74  03/27/16 108/60  03/24/16 106/62   Lab Results  Component Value Date   CREATININE 1.23 03/24/2016     2. 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 Current symptoms: No polyuria, polydipsia, blurry vision, chest pain, dyspnea or claudication.  Mild foot burning, numbness or pain. Taking medication compliantly without noted sided effects. No episodes of hypoglycemia.  On ACE inhibitor or angiotensin II receptor blocker?  Yes. On Aspirin? Yes.   4. Health care maintenance. Due for shingles vaccine.    5. Osteoarthritis of spine with radiculopathy, cervical region. Chronic. Controlled on current medications. No noted side effects.    Health Maintenance Due  Topic Date Due  . Hepatitis C Screening  May 25, 1955  . OPHTHALMOLOGY EXAM  01/17/1964  . HIV Screening  01/16/1969  . TETANUS/TDAP  01/16/1973  . COLONOSCOPY  01/17/2004  . PNEUMOCOCCAL POLYSACCHARIDE VACCINE (2) 06/26/2013   PMHx, SurgHx, SocialHx, FamHx, Medications, and  Allergies were reviewed in the Visit Navigator and updated as appropriate.   Patient Active Problem List   Diagnosis Date Noted  . Radicular pain in left arm 03/27/2016  . Ulnar neuritis, left 03/27/2016  . Osteoarthritis of spine with radiculopathy, cervical region 03/27/2016  . Diastolic CHF, chronic ([place]) 03/24/2016  . Edema 08/12/2010  . PVD (peripheral vascular disease) ([place]) 04/30/2009  . Morbid obesity ([place]) 12/26/2008  . 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 HLD (hyperlipidemia) 01/04/2007  . Acute MI ([place]) 01/04/2007  . Bronchitis 01/04/2007  . Hepatic cirrhosis ([place]) 01/04/2007  . Pancreatitis 01/04/2007  . Obstructive sleep apnea 01/04/2007  . HTN (hypertension) 01/04/2007   Social History  Substance Use Topics  . Smoking status: Former Smoker    Quit date: 10/20/2004  . Smokeless tobacco: Never Used  . Alcohol use Yes     Comment: occasional   Current Medications and Allergies:   .  albuterol (PROVENTIL HFA;VENTOLIN HFA) 108 (90 Base) MCG/ACT inhaler, Inhale 2 puffs into the lungs every 6 (six) hours as needed for wheezing or shortness of breath., Disp: 1 Inhaler, Rfl: 12 .  albuterol (PROVENTIL) (2.5 MG/3ML) 0.083% nebulizer solution, Take 3 mLs (2.5 mg total) by nebulization every 6 (six) hours as needed for wheezing or shortness of breath., Disp: 75 mL, Rfl: prn .  aspirin EC 325 MG tablet, Take 325 mg by mouth daily.  , Disp: , Rfl:  .  eplerenone (INSPRA) 25 MG tablet, Take 1 tablet (25 mg  total) by mouth daily., Disp: 30 tablet, Rfl: 11 .  fluticasone (FLONASE) 50 MCG/ACT nasal spray, Place 1 spray into both nostrils daily., Disp: 16 g, Rfl: 12 .  HUMALOG KWIKPEN 200 UNIT/ML SOPN, Inject 10-30 Units into the skin 3 (three) times daily with meals. 20 units with smaller meals 30 units with regular meals and 10 units with snacks. Sliding Scale, Disp: , Rfl: 0 .  ipratropium (ATROVENT) 0.02 % nebulizer solution, Take  2.5 mLs (0.5 mg total) by nebulization every 6 (six) hours as needed for wheezing or shortness of breath., Disp: 75 mL, Rfl: prn .  JARDIANCE 25 MG TABS, Take 25 mg by mouth daily. , Disp: , Rfl:  .  LANTUS SOLOSTAR 100 UNIT/ML Solostar Pen, Inject 70 Units into the skin every morning. , Disp: , Rfl: 0 .  losartan (COZAAR) 25 MG tablet, Take 25 mg by mouth daily., Disp: , Rfl: 0 .  metoprolol (LOPRESSOR) 50 MG tablet, Take 1 tablet (50 mg total) by mouth daily., Disp: 30 tablet, Rfl: 11 .  nitroGLYCERIN (NITROSTAT) 0.4 MG SL tablet, Place 1 tablet (0.4 mg total) under the tongue every 5 (five) minutes as needed., Disp: 25 tablet, Rfl: 11 .  nystatin cream (MYCOSTATIN), 1 application daily as needed., Disp: , Rfl: 0 .  potassium chloride SA (K-DUR,KLOR-CON) 20 MEQ tablet, Take 1 tablet (20 mEq total) by mouth daily., Disp: 30 tablet, Rfl: 11 .  pyridOXINE (VITAMIN B-6) 100 MG tablet, Take daily to help with nerve health, Disp: , Rfl:  .  rosuvastatin (CRESTOR) 20 MG tablet, Take 1 tablet (20 mg total) by mouth every evening., Disp: 30 tablet, Rfl: 11 .  torsemide (DEMADEX) 20 MG tablet, Take 1 tablet (20 mg total) by mouth 2 (two) times daily., Disp: 60 tablet, Rfl: 11  Allergies  Allergen Reactions  . Ibuprofen Other (See Comments)    Stomach pain   Review of Systems   Review of Systems  Constitutional: Negative for chills, fever and malaise/fatigue.  HENT: Positive for congestion. Negative for ear pain, sinus pain and sore throat.   Eyes: Negative for blurred vision and double vision.  Respiratory: Negative for cough, shortness of breath and wheezing.   Cardiovascular: Negative for chest pain, palpitations and leg swelling.  Gastrointestinal: Negative for abdominal pain, constipation, diarrhea and vomiting.  Genitourinary: Negative for dysuria.  Musculoskeletal: Positive for neck pain. Negative for back pain and joint pain.  Skin: Negative for itching and rash.  Neurological: Negative  for dizziness and headaches.  Psychiatric/Behavioral: Negative for depression, hallucinations and memory loss.   Vitals:   Vitals:   06/18/16 1336  BP: 116/74  Pulse: 98  Temp: 98 F (36.7 C)  TempSrc: Oral  SpO2: 94%  Weight: 289 lb 6.4 oz (131.3 kg)  Height: 5\' 8"  (1.727 m)     Body mass index is 44 kg/m.   Physical Exam:   Physical Exam  Constitutional: He is oriented to person, place, and time. He appears well-developed and well-nourished. No distress.  HENT:  Head: Normocephalic and atraumatic.  Right Ear: External ear normal.  Left Ear: External ear normal.  Nose: Nose normal.  Mouth/Throat: Oropharynx is clear and moist.  Eyes: Conjunctivae and EOM are normal. Pupils are equal, round, and reactive to light.  Neck: Normal range of motion. Neck supple.  Cardiovascular: Normal rate, regular rhythm, normal heart sounds and intact distal pulses.   Pulmonary/Chest: Effort normal and breath sounds normal.  Abdominal: Soft. Bowel sounds are normal.  Musculoskeletal: Normal range of motion.  Feet:  Right Foot:  Skin Integrity: Positive for callus and dry skin. Negative for ulcer, blister or skin breakdown.  Left Foot:  Skin Integrity: Positive for callus and dry skin. Negative for ulcer, blister or skin breakdown.  Neurological: He is alert and oriented to person, place, and time.  Psychiatric: He has a normal mood and affect. His behavior is normal. Judgment and thought content normal.  Nursing note and vitals reviewed.   Results for orders placed or performed in visit on 06/18/16  POCT glycosylated hemoglobin (Hb A1C)  Result Value Ref Range   Hemoglobin A1C 9.9    Assessment and Plan:   Elishah was seen today for follow-up.  Diagnoses and all orders for this visit:  Essential hypertension Comments: Well controlled.  No signs of complications, medication side effects, or red flags.  Continue current regimen.    Morbid obesity ([place]) Comments: Well  controlled.  No signs of complications, medication side effects, or red flags.  Continue current regimen.    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 A1c today is uncontrolled. He is working on diet and monitoring blood sugars. He sees a podiatrist. Orders: -     POCT glycosylated hemoglobin (Hb A1C)  Health care maintenance Comments: Shingrix prescription printed for patient to take to pharmacy. Orders: -     Zoster Vac Recomb Adjuvanted (SHINGRIX) injection; Inject 0.5 mLs into the muscle once.  Osteoarthritis of spine with radiculopathy, cervical region Comments: Well controlled.  No signs of complications, medication side effects, or red flags.  Continue current regimen.      . Reviewed expectations re: course of current medical issues. . Discussed self-management of symptoms. . Outlined signs and symptoms indicating need for more acute intervention. . Patient verbalized understanding and all questions were answered. . See orders for this visit as documented in the electronic medical record. . Patient received an After Visit Summary.   CMA served as Education administrator during this visit. History, Physical, and Plan performed by medical provider. Documentation and orders reviewed and attested to. Briscoe Deutscher, D.O.  Briscoe Deutscher, DO [place], [place] 06/19/2016  Future Appointments Date Time Provider [place]  06/20/2016 8:00 PM [facility]  09/18/2016 1:15 PM Briscoe Deutscher, DO [facility] None

## 2016-06-20 ENCOUNTER — Encounter (HOSPITAL_BASED_OUTPATIENT_CLINIC_OR_DEPARTMENT_OTHER): Payer: Medicare Other

## 2016-06-24 ENCOUNTER — Telehealth: Payer: Self-pay | Admitting: Family Medicine

## 2016-06-24 ENCOUNTER — Telehealth: Payer: Self-pay | Admitting: Internal Medicine

## 2016-06-24 NOTE — Telephone Encounter (Signed)
Patient called to advise that he would like to do the sleep study at home and would like to speak to Dr. Juleen China or nurse more on the specifics. Please call and advise patient as necessary.

## 2016-06-24 NOTE — Telephone Encounter (Signed)
CY the soonest appt I can do for patient is 07/17/16 unless you would be willing to be double booked. Please advise. Thanks.

## 2016-06-24 NOTE — Telephone Encounter (Signed)
Called and spoke to pt. Pt states his PCP, Dr. Juleen China, ordered a NPSG. Pt is requesting this be changed to a home sleep study. Pt initially called PCP and asked them to change the order and was told to call our office since [facility] has order a sleep study in the past.   Dr. Annamaria Boots please advise. Thanks.

## 2016-06-24 NOTE — Telephone Encounter (Signed)
Spoke with patient and Pulmonology has already ordered a sleep study. He is going to call them to change to a home sleep study.

## 2016-06-24 NOTE — Telephone Encounter (Signed)
I don't see the note where the study was ordered. I want to make sure he has the required office visit before his study so Medicare will not deny it. Lets ask Joellen Jersey to get him in with me in the next two weeks sometime, so I can order the HST after that visit.

## 2016-06-25 NOTE — Telephone Encounter (Signed)
6/7 should be ok for this

## 2016-06-25 NOTE — Telephone Encounter (Signed)
I spoke with the pt and scheduled appt with CDY for 07/17/16

## 2016-06-29 ENCOUNTER — Encounter (HOSPITAL_BASED_OUTPATIENT_CLINIC_OR_DEPARTMENT_OTHER): Payer: Medicare Other

## 2016-07-17 ENCOUNTER — Ambulatory Visit (INDEPENDENT_AMBULATORY_CARE_PROVIDER_SITE_OTHER): Payer: Medicare Other | Admitting: Internal Medicine

## 2016-07-17 ENCOUNTER — Encounter: Payer: Self-pay | Admitting: Internal Medicine

## 2016-07-17 VITALS — BP 118/62 | HR 98 | Ht 68.0 in | Wt 294.0 lb

## 2016-07-17 DIAGNOSIS — J3089 Other allergic rhinitis: Secondary | ICD-10-CM

## 2016-07-17 DIAGNOSIS — G4733 Obstructive sleep apnea (adult) (pediatric): Secondary | ICD-10-CM

## 2016-07-17 DIAGNOSIS — J40 Bronchitis, not specified as acute or chronic: Secondary | ICD-10-CM | POA: Diagnosis not present

## 2016-07-17 DIAGNOSIS — J302 Other seasonal allergic rhinitis: Secondary | ICD-10-CM | POA: Insufficient documentation

## 2016-07-17 DIAGNOSIS — R609 Edema, unspecified: Secondary | ICD-10-CM | POA: Diagnosis not present

## 2016-07-17 MED ORDER — FLUTICASONE PROPIONATE 50 MCG/ACT NA SUSP
1.0000 | Freq: Every day | NASAL | 12 refills | Status: DC
Start: 2016-07-17 — End: 2018-06-14

## 2016-07-17 NOTE — Assessment & Plan Note (Signed)
He is willing to have a new sleep study at the request of his PCP. We can get this done as a home study to confirm the diagnosis. Since he is already using oxygen, he will after work with Medicare rules in order to get both CPAP and oxygen paid for. Assuming he qualifies on his diagnostic study, we probably will need to set him up with the CPAP titration study at the sleep Center so that continued need for oxygen on therapeutic CPAP pressure can be verified.  plan-home sleep test at his request.

## 2016-07-17 NOTE — Progress Notes (Signed)
HPI  male former smoker followed for OSA, chronic bronchitis, SAR, CAD/MI/CABG/ CHF, HBP, DM, cirrhosis liver, Continues oxygen 2 L for sleep/ [employer]. PFT 06/18/2011-moderate restriction. Flows are normal for measure volume with FEV1/FVC 0.84 and insignificant response to bronchodilator. DLCO 52%.   ----------------------------------------------------------------------------------  07/06/2015-61 year old male former smoker followed for OSA, chronic bronchitis, SAR, CAD/MI/CABG    LOV 10/20/14- NP- scheduled split-night sleep study FOLLOWS FOR: Never had sleep study; has SOB with exertion at times.  Blames heat and humidity for exertional dyspnea with little cough or wheeze. Admits daytime sleepiness and wife still reports loud snoring. He agrees to go forward with sleep study previously scheduled but he never set a date before. CXR 02/25/2015- IMPRESSION: Mild stable cardiomegaly with mild vascular congestion. Electronically Signed  By: Marin Olp M.D.  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 He admits loud snoring and daytime sleepiness. His PCP has asked that he get rechecked. Occasional wheezing-uses nebulizer 3 times per week. Asks Flonase refill for rhinitis.  ROS-see HPI Constitutional:   No-   weight loss, night sweats, fevers, chills, + fatigue, lassitude. HEENT:   No-  headaches, difficulty swallowing, tooth/dental problems, sore throat,       No-  sneezing, itching, ear ache, nasal congestion, post nasal drip,  CV:  No-   chest pain, orthopnea, PND, + swelling in lower extremities, anasarca,  dizziness, palpitations Resp: + Shortness of breath with exertion or at rest.              No-   productive cough,  No non-productive cough,  No-  coughing up of blood.              No-   change in color of mucus.  + Occasional wheezing.   Skin: No-   rash or lesions. GI:  No-   heartburn, indigestion, abdominal pain, nausea, vomiting,  GU: . MS:  No-   joint pain or swelling.   Neuro-     nothing unusual Psych:  No- change in mood or affect. No depression or anxiety.  No memory loss.   OBJ- Physical Exam General- Alert, Oriented, Affect-appropriate, Distress- none acute, obese,  Skin- rash-none, lesions- none, excoriation- none. + Increased skin turgor? Edema Lymphadenopathy- none Head- atraumatic            Eyes- Gross vision intact, PERRLA, conjunctivae and secretions clear            Ears- Hearing, canals-normal            Nose- Clear, no-Septal dev, mucus, polyps, erosion, perforation             Throat- Mallampati IV , mucosa clear , drainage- none, tonsils- atrophic, +missing teeth Neck- flexible , trachea midline, no stridor , thyroid nl, carotid no bruit Chest - symmetrical excursion , unlabored           Heart/CV- RRR , no murmur , no gallop  , no rub, nl s1 s2                           - JVD- none , edema- none, stasis changes- none, varices- none           Lung- clear to P&A, wheeze-  none, cough- none , dullness-none, rub- none           Chest wall- sternotomy scar Abd-  Br/ Gen/ Rectal- Not done, not indicated Extrem- cyanosis- none, clubbing, none, atrophy- none, strength- nl. Vascular graft donor site scar left forearm. + [place] Neuro- grossly intact to observation

## 2016-07-17 NOTE — Assessment & Plan Note (Signed)
Stable perennial need for nebulizer treatments about 3 times a week. He feels comfortably controlled.

## 2016-07-17 NOTE — Assessment & Plan Note (Signed)
Keeps Flonase available, used for intervals when needed.

## 2016-07-17 NOTE — Assessment & Plan Note (Signed)
Weight loss is strongly encouraged again

## 2016-07-17 NOTE — Assessment & Plan Note (Signed)
He and his brother share a brawny feel to the skin of his back which I think is probably interstitial edema or some kind of lipodystrophy, noted x years.

## 2016-07-17 NOTE — Patient Instructions (Addendum)
Order- unattended home sleep test    Dx OSA       Don't wear your oxygen the night that you do this test.  Script sent for Flonase  We may need to set up a sleep center study to show that you need both CPAP and oxygen. We will get the home study done first.  Please call for directions after this first study.

## 2016-08-05 DIAGNOSIS — G4733 Obstructive sleep apnea (adult) (pediatric): Secondary | ICD-10-CM | POA: Diagnosis not present

## 2016-08-12 ENCOUNTER — Other Ambulatory Visit: Payer: Self-pay | Admitting: *Deleted

## 2016-08-12 DIAGNOSIS — G4733 Obstructive sleep apnea (adult) (pediatric): Secondary | ICD-10-CM

## 2016-08-18 ENCOUNTER — Telehealth: Payer: Self-pay

## 2016-08-18 NOTE — Telephone Encounter (Signed)
Received notification from [employer] that patient never submitted his cologuard sample.    Order has been suspended.  They do not need a new order.  Order will be reactivated if patient submits a sample within the next year.

## 2016-09-15 ENCOUNTER — Telehealth: Payer: Self-pay | Admitting: Cardiology

## 2016-09-15 DIAGNOSIS — I25708 Atherosclerosis of coronary artery bypass graft(s), unspecified, with other forms of angina pectoris: Secondary | ICD-10-CM

## 2016-09-15 MED ORDER — NITROGLYCERIN 0.4 MG SL SUBL: 0.4000 mg | SUBLINGUAL_TABLET | SUBLINGUAL | 2 refills | Status: DC | PRN

## 2016-09-15 NOTE — Telephone Encounter (Signed)
Patient of Dr. Percival Spanish calling regarding BP/HR/dizziness  He reports BP last night was 98/64 and HR 74 The other day, after being outside, bringing in the trash cans his BP was 130/108 and HR 116. He states the air was thick this day and he can't breathe when the air is thick. He states he sat down for a bit and his heart slowed down. He states he felt faint and almost blacked out. He states this is not a normal occurrence for him. He got some Pedialyte which has helped. Advised patient on proper hydration and avoiding strenuous activity when it is hot/humid outside since he reports he cannot tolerate this type of weather. He states his usual BP is 118/70s. Advised to monitor home BPs/HR for about a week or so (twice daily) for trends to ensure no highs/lows. Verified med list with patient. Informed him that I would route message to MD for advice/recommendations.   NTG refilled per patient request, but NO chest pain

## 2016-09-15 NOTE — Telephone Encounter (Signed)
New Message     Pt c/o BP issue: STAT if pt c/o blurred vision, one-sided weakness or slurred speech  1. What are your last 5 BP readings? 116 hr  130/108   2. Are you having any other symptoms (ex. Dizziness, headache, blurred vision, passed out)? Dizziness with exertion for 2 days  3. What is your BP issue? Pt has been having dizziness with exertion , pt states his blood sugar was low 87

## 2016-09-16 NOTE — Telephone Encounter (Signed)
I would not suggest any med changes at this time.

## 2016-09-16 NOTE — Telephone Encounter (Signed)
LM with MD recommendation of no med changes/call w/concerns

## 2016-09-18 ENCOUNTER — Ambulatory Visit: Payer: Medicare Other | Admitting: Family Medicine

## 2016-09-19 ENCOUNTER — Ambulatory Visit: Payer: Medicare Other | Admitting: Family Medicine

## 2016-10-12 ENCOUNTER — Other Ambulatory Visit: Payer: Self-pay | Admitting: Physician Assistant

## 2016-10-14 NOTE — Telephone Encounter (Signed)
Rx(s) sent to pharmacy electronically.  

## 2016-10-26 NOTE — Progress Notes (Signed)
Cardiology Office Note   Date:  10/29/2016   ID:  [name] [name], DOB 11-Dec-1955, MRN [id number]  PCP:  Briscoe Deutscher, DO  Cardiologist:   Minus Breeding, MD  Referring:  Briscoe Deutscher, DO  Chief Complaint  Patient presents with  . Coronary Artery Disease      History of Present Illness: [name] [name] is a 61 y.o. male who presents for one year follow up of CAD.  Since I last saw him he has done well.  He did call once with dizziness that he says was related to low BS.  However, he denies any cardiovascular symptoms.  The patient denies any new symptoms such as chest discomfort, neck or arm discomfort. There has been no new shortness of breath, PND or orthopnea. There have been no reported palpitations or syncope.         Past Medical History:  Diagnosis Date  . Acute myocardial infarction, unspecified site, episode of care unspecified 2005  . Acute pancreatitis   . CAD (coronary artery disease)    status post CABG in 2005 w LIMA to LAD, left radial to second circumflex marginal, saphenous vein graft to PDA, saphenous vein graft to lateral subbrach of ramus intermediate, and sequential saphenous vein graft to the medial subbranch of ramus intermediate  . Cirrhosis of liver without mention of alcohol   . Complications affecting other specified body systems, hypertension   . Hyperlipidemia   . Neuropathy   . Other and unspecified hyperlipidemia   . Proteinuria   . Sleep apnea   . Type II or unspecified type diabetes mellitus without mention of complication, not stated as uncontrolled     Past Surgical History:  Procedure Laterality Date  . CORONARY ARTERY BYPASS GRAFT    . ENT surgery       Current Outpatient Prescriptions  Medication Sig Dispense Refill  . ACCU-CHEK AVIVA PLUS test strip     . albuterol (PROVENTIL HFA;VENTOLIN HFA) 108 (90 Base) MCG/ACT inhaler Inhale 2 puffs into the lungs every 6 (six) hours as needed for wheezing or shortness of breath. 1  Inhaler 12  . albuterol (PROVENTIL) (2.5 MG/3ML) 0.083% nebulizer solution Take 3 mLs (2.5 mg total) by nebulization every 6 (six) hours as needed for wheezing or shortness of breath. 75 mL prn  . aspirin EC 325 MG tablet Take 325 mg by mouth daily.      . B-D ULTRAFINE III SHORT PEN 31G X 8 MM MISC   1  . eplerenone (INSPRA) 25 MG tablet take 1 tablet by mouth once daily 30 tablet 1  . fluticasone (FLONASE) 50 MCG/ACT nasal spray Place 1 spray into both nostrils daily. 16 g 12  . HUMALOG KWIKPEN 200 UNIT/ML SOPN Inject 10-30 Units into the skin 3 (three) times daily with meals. 20 units with smaller meals 30 units with regular meals and 10 units with snacks. Sliding Scale  0  . ipratropium (ATROVENT) 0.02 % nebulizer solution Take 2.5 mLs (0.5 mg total) by nebulization every 6 (six) hours as needed for wheezing or shortness of breath. 75 mL prn  . JARDIANCE 25 MG TABS Take 25 mg by mouth daily.     . Lancets (ACCU-CHEK MULTICLIX) lancets     . LANTUS SOLOSTAR 100 UNIT/ML Solostar Pen Inject 70 Units into the skin every morning.   0  . losartan (COZAAR) 25 MG tablet Take 25 mg by mouth daily.  0  . methylPREDNISolone (MEDROL DOSEPAK) 4 MG TBPK  tablet Take by mouth as directed. Take 6 tablets on the first day prescribed then as directed. 21 tablet 0  . metoprolol (LOPRESSOR) 50 MG tablet Take 1 tablet (50 mg total) by mouth daily. 30 tablet 11  . nitroGLYCERIN (NITROSTAT) 0.4 MG SL tablet Place 1 tablet (0.4 mg total) under the tongue every 5 (five) minutes as needed. 25 tablet 2  . nystatin cream (MYCOSTATIN) 1 application daily as needed.  0  . potassium chloride SA (K-DUR,KLOR-CON) 20 MEQ tablet Take 1 tablet (20 mEq total) by mouth daily. 30 tablet 11  . pyridOXINE (VITAMIN B-6) 100 MG tablet Take daily to help with nerve health    . rosuvastatin (CRESTOR) 20 MG tablet Take 1 tablet (20 mg total) by mouth every evening. 30 tablet 11  . torsemide (DEMADEX) 20 MG tablet Take 1 tablet (20 mg  total) by mouth 2 (two) times daily. 60 tablet 11  . traMADol (ULTRAM) 50 MG tablet      No current facility-administered medications for this visit.     Allergies:   Ibuprofen    ROS:  Please see the history of present illness.   Otherwise, review of systems are positive for none.   All other systems are reviewed and negative.    PHYSICAL EXAM: VS:  BP 124/77   Pulse 94   Ht 5\' 9"  (1.753 m)   Wt 292 lb 3.2 oz (132.5 kg)   BMI 43.15 kg/m  , BMI Body mass index is 43.15 kg/m.  GENERAL:  Well appearing NECK:  No jugular venous distention, waveform within normal limits, carotid upstroke brisk and symmetric, no bruits, no thyromegaly LUNGS:  Clear to auscultation bilaterally CHEST:  Unremarkable HEART:  PMI not displaced or sustained,S1 and S2 within normal limits, no S3, no S4, no clicks, no rubs, no murmurs ABD:  Flat, positive bowel sounds normal in frequency in pitch, no bruits, no rebound, no guarding, no midline pulsatile mass, no hepatomegaly, no splenomegaly, obese EXT:  2 plus pulses throughout, bilateral mild ankle edema, no cyanosis no clubbing, chronic venous stasis changes.    EKG:  EKG is ordered today. The ekg ordered today demonstrates Sinus rhythm, rate 94, right bundle branch block, left ventricular hypertrophy by voltage criteria, no acute ST-T wave changes.   Recent Labs: 03/24/2016: ALT 10; Brain Natriuretic Peptide 30.3; BUN 16; Creat 1.23; Potassium 3.2; Sodium 136     Wt Readings from Last 3 Encounters:  10/27/16 292 lb 3.2 oz (132.5 kg)  07/17/16 294 lb (133.4 kg)  06/18/16 289 lb 6.4 oz (131.3 kg)      Other studies Reviewed: Additional studies/ records that were reviewed today include: Labs. Review of the above records demonstrates:  Please see elsewhere in the note.     ASSESSMENT AND PLAN:  CAD:  The patient has no new sypmtoms.  No further cardiovascular testing is indicated.  We will continue with aggressive risk reduction and meds as  listed.  DYSLIPIDEMIA:  His LDL was 72 and HDL 53 last year in Oct.  He will remain on the meds as listed.  He should get another lipid profile next month and I will defer to Dr. Buddy Duty or Briscoe Deutscher, DO  DM:  A1C was 9.6 in August.  Unfortunately he doesn't feel well when it goes into the 90s.  He follows with Dr. Buddy Duty.    HTN:  He has called with some fluctuating BPs including systolics in the 32R.  However, this has not been  a recent problem.  He will continue the meds as listed.   SLEEP APNEA:  He sees Dr. Annamaria Boots.  He had a home test in June and I reviewed these results.  He has not been contacted so I sent a message.  He had positive results and will likely need further therapy.   Current medicines are reviewed at length with the patient today.  The patient does not have concerns regarding medicines.  The following changes have been made:  no change  Labs/ tests ordered today include: None  Orders Placed This Encounter  Procedures  . EKG 12-Lead     Disposition:   FU with me in one year.     Signed, Minus Breeding, MD  10/29/2016 1:29 PM    [facility]

## 2016-10-27 ENCOUNTER — Encounter: Payer: Self-pay | Admitting: Cardiology

## 2016-10-27 ENCOUNTER — Ambulatory Visit (INDEPENDENT_AMBULATORY_CARE_PROVIDER_SITE_OTHER): Payer: Medicare Other | Admitting: Cardiology

## 2016-10-27 VITALS — BP 124/77 | HR 94 | Ht 69.0 in | Wt 292.2 lb

## 2016-10-27 DIAGNOSIS — E118 Type 2 diabetes mellitus with unspecified complications: Secondary | ICD-10-CM

## 2016-10-27 DIAGNOSIS — G4730 Sleep apnea, unspecified: Secondary | ICD-10-CM | POA: Diagnosis not present

## 2016-10-27 DIAGNOSIS — I10 Essential (primary) hypertension: Secondary | ICD-10-CM | POA: Diagnosis not present

## 2016-10-27 DIAGNOSIS — I2510 Atherosclerotic heart disease of native coronary artery without angina pectoris: Secondary | ICD-10-CM

## 2016-10-27 NOTE — Patient Instructions (Signed)
Medication Instruction: DECREASE- Aspirin 81 mg daily  If you need a refill on your cardiac medications before your next appointment, please call your pharmacy.  Labwork: None Ordered  Testing/Procedures: None Ordered  Follow-Up: Your physician wants you to follow-up in: 1 Year. You should receive a reminder letter in the mail two months in advance. If you do not receive a letter, please call our office [phone].   Thank you for choosing [facility] at [facility]!!

## 2016-10-29 ENCOUNTER — Encounter: Payer: Self-pay | Admitting: Cardiology

## 2016-10-29 ENCOUNTER — Telehealth: Payer: Self-pay | Admitting: Internal Medicine

## 2016-10-29 DIAGNOSIS — G4733 Obstructive sleep apnea (adult) (pediatric): Secondary | ICD-10-CM

## 2016-10-29 DIAGNOSIS — I2510 Atherosclerotic heart disease of native coronary artery without angina pectoris: Secondary | ICD-10-CM | POA: Insufficient documentation

## 2016-10-29 NOTE — Telephone Encounter (Signed)
Spoke with pt, he states he never received his results from the home sleep study test. He had one in June. CY can you read the report and send back to triage. Thanks!

## 2016-10-31 NOTE — Telephone Encounter (Signed)
Patient calling again for HST results, CB is [phone].

## 2016-10-31 NOTE — Telephone Encounter (Signed)
His sleep study confirmed obstructive sleep apnea averaging 37.5 apnea episodes per hour Please order CPAP auto 5-20, mask of choice, humidifier, supplies, Air view for DX OSA. Please make sure he has a return appointment with me scheduled within 3 months.

## 2016-10-31 NOTE — Telephone Encounter (Signed)
PT is aware that we are awaiting results from CY.  CY please advise. Thanks.

## 2016-10-31 NOTE — Telephone Encounter (Signed)
Spoke with pt. He is aware of his sleep study results. Order for CPAP has been placed. ROV has been scheduled. Nothing further was needed.

## 2016-11-10 ENCOUNTER — Other Ambulatory Visit: Payer: Self-pay | Admitting: Physician Assistant

## 2016-11-11 NOTE — Telephone Encounter (Signed)
Rx(s) sent to pharmacy electronically.  

## 2016-11-18 ENCOUNTER — Encounter (HOSPITAL_COMMUNITY): Payer: Self-pay

## 2016-11-18 ENCOUNTER — Emergency Department (HOSPITAL_COMMUNITY)
Admission: EM | Admit: 2016-11-18 | Discharge: 2016-11-18 | Disposition: A | Discharge status: Home / Self Care | Payer: Medicare Other | Attending: Emergency Medicine | Admitting: Emergency Medicine

## 2016-11-18 ENCOUNTER — Telehealth: Payer: Self-pay | Admitting: Family Medicine

## 2016-11-18 DIAGNOSIS — R51 Headache: Secondary | ICD-10-CM | POA: Diagnosis present

## 2016-11-18 DIAGNOSIS — Z5321 Procedure and treatment not carried out due to patient leaving prior to being seen by health care provider: Secondary | ICD-10-CM | POA: Diagnosis not present

## 2016-11-18 NOTE — ED Triage Notes (Signed)
Pt states that he was in the kitchen, felt flushed and had a headache. Now resolved, denies dizziness, states he felt it was his anxiety and he is out of his medication.

## 2016-11-18 NOTE — ED Notes (Signed)
Pt up to nurse first desk stating that he doesn't want to wait to be seen. LWBS after triage.

## 2016-11-18 NOTE — Telephone Encounter (Signed)
Spoke with patient and explained that he would need to be seen to get prescription. Patient has not been seen since May 2018 and has canceled several appointments. He said that he will call back to schedule appointment.

## 2016-11-18 NOTE — Telephone Encounter (Signed)
Patient was seen last night at [facility] for an anxiety attack. He states that he is out of his anxiety medication. Wants to know if Dr. Juleen China will call in a new rx. Patient uses  [facility] - , [place] - [place] [phone] (Phone) [phone] (Fax)

## 2016-11-25 ENCOUNTER — Ambulatory Visit: Payer: Medicare Other | Admitting: Family Medicine

## 2016-12-08 ENCOUNTER — Other Ambulatory Visit: Payer: Self-pay | Admitting: Cardiology

## 2016-12-09 ENCOUNTER — Ambulatory Visit: Payer: Medicare Other | Admitting: Family Medicine

## 2016-12-10 ENCOUNTER — Ambulatory Visit (INDEPENDENT_AMBULATORY_CARE_PROVIDER_SITE_OTHER): Payer: Medicare Other | Admitting: Family Medicine

## 2016-12-10 ENCOUNTER — Encounter: Payer: Self-pay | Admitting: Family Medicine

## 2016-12-10 VITALS — BP 114/62 | HR 94 | Temp 97.4°F | Ht 69.0 in | Wt 292.2 lb

## 2016-12-10 DIAGNOSIS — I8393 Asymptomatic varicose veins of bilateral lower extremities: Secondary | ICD-10-CM

## 2016-12-10 DIAGNOSIS — I25708 Atherosclerosis of coronary artery bypass graft(s), unspecified, with other forms of angina pectoris: Secondary | ICD-10-CM | POA: Diagnosis not present

## 2016-12-10 DIAGNOSIS — F419 Anxiety disorder, unspecified: Secondary | ICD-10-CM | POA: Diagnosis not present

## 2016-12-10 DIAGNOSIS — Z23 Encounter for immunization: Secondary | ICD-10-CM

## 2016-12-10 MED ORDER — LORAZEPAM 0.5 MG PO TABS: 0.5000 mg | ORAL_TABLET | Freq: Two times a day (BID) | ORAL | 1 refills | Status: DC | PRN

## 2016-12-10 MED ORDER — NITROGLYCERIN 0.4 MG SL SUBL: 0.4000 mg | SUBLINGUAL_TABLET | SUBLINGUAL | 2 refills | Status: DC | PRN

## 2016-12-10 NOTE — Progress Notes (Signed)
[name] [name] is a 61 y.o. male is here for follow up.  History of Present Illness:   [name] [name] CMA acting as scribe for Dr. Juleen [name].  HPI: Patient comes in today for follow up.   Diabetes: Patient sees endocrinologist for this. Patient reported that the last A1C was a 9.0. We will not address diabetes since endocrinology follows this. Endocrine ROS: negative for - palpitations, polydipsia/polyuria, skin changes or unexpected weight changes.  Anxiety: Patient was taken to the hospital for anxiety attack. He was having dizzy spells. He has taken Ativan and Xanax in the past. We will send in the Ativan for the patient today. Neurological ROS: negative for - confusion, dizziness, gait disturbance, headaches, memory loss or weakness. Psychological ROS: negative for - depression, mood swings, obsessive thoughts or suicidal ideation.  Health Maintenance Due  Topic Date Due  . HIV Screening  01/16/1969  . TETANUS/TDAP  01/16/1973  . COLONOSCOPY  01/17/2004  . PNEUMOCOCCAL POLYSACCHARIDE VACCINE (2) 06/26/2013   Depression screen PHQ 2/9 03/24/2016  Decreased Interest 0  Down, Depressed, Hopeless 0  PHQ - 2 Score 0   PMHx, SurgHx, SocialHx, FamHx, Medications, and Allergies were reviewed in the Visit Navigator and updated as appropriate.   Patient Active Problem List   Diagnosis Date Noted  . Coronary artery disease involving native coronary artery of native heart without angina pectoris 10/29/2016  . Seasonal and perennial allergic rhinitis 07/17/2016  . Radicular pain in left arm 03/27/2016  . Ulnar neuritis, left 03/27/2016  . Osteoarthritis of spine with radiculopathy, cervical region 03/27/2016  . Diastolic CHF, chronic ([place]) 03/24/2016  . Edema 08/12/2010  . PVD (peripheral vascular disease) ([place]) 04/30/2009  . Morbid obesity ([place]) 12/26/2008  . 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 HLD (hyperlipidemia) 01/04/2007  .  Acute MI ([place]) 01/04/2007  . Bronchitis 01/04/2007  . Hepatic cirrhosis ([place]) 01/04/2007  . Pancreatitis 01/04/2007  . Obstructive sleep apnea 01/04/2007  . HTN (hypertension) 01/04/2007   Social History  Substance Use Topics  . Smoking status: Former Smoker    Quit date: 10/20/2004  . Smokeless tobacco: Never Used  . Alcohol use Yes     Comment: occasional   Current Medications and Allergies:   .  ACCU-CHEK AVIVA PLUS test strip, , Disp: , Rfl:  .  albuterol (PROVENTIL HFA;VENTOLIN HFA) 108 (90 Base) MCG/ACT inhaler, Inhale 2 puffs into the lungs every 6 (six) hours as needed for wheezing or shortness of breath., Disp: 1 Inhaler, Rfl: 12 .  albuterol (PROVENTIL) (2.5 MG/3ML) 0.083% nebulizer solution, Take 3 mLs (2.5 mg total) by nebulization every 6 (six) hours as needed for wheezing or shortness of breath., Disp: 75 mL, Rfl: prn .  aspirin EC 81 MG tablet, Take 81 mg by mouth daily. , Disp: , Rfl:  .  B-D ULTRAFINE III SHORT PEN 31G X 8 MM MISC, , Disp: , Rfl: 1 .  eplerenone (INSPRA) 25 MG tablet, take 1 tablet by mouth once daily, Disp: 30 tablet, Rfl: 11 .  fluticasone (FLONASE) 50 MCG/ACT nasal spray, Place 1 spray into both nostrils daily., Disp: 16 g, Rfl: 12 .  HUMALOG KWIKPEN 200 UNIT/ML SOPN, Inject 10-30 Units into the skin 3 (three) times daily with meals. 20 units with smaller meals 30 units with regular meals and 10 units with snacks. Sliding Scale, Disp: , Rfl: 0 .  ipratropium (ATROVENT) 0.02 % nebulizer solution, Take 2.5 mLs (0.5 mg total) by nebulization every 6 (  six) hours as needed for wheezing or shortness of breath., Disp: 75 mL, Rfl: prn .  JARDIANCE 25 MG TABS, Take 25 mg by mouth daily. , Disp: , Rfl:  .  Lancets (ACCU-CHEK MULTICLIX) lancets, , Disp: , Rfl:  .  LANTUS SOLOSTAR 100 UNIT/ML Solostar Pen, Inject 70 Units into the skin every morning. , Disp: , Rfl: 0 .  losartan (COZAAR) 25 MG tablet, Take 25 mg by mouth daily., Disp: , Rfl: 0 .   methylPREDNISolone (MEDROL DOSEPAK) 4 MG TBPK tablet, Take by mouth as directed. Take 6 tablets on the first day prescribed then as directed., Disp: 21 tablet, Rfl: 0 .  metoprolol tartrate (LOPRESSOR) 50 MG tablet, take 1 tablet by mouth once daily, Disp: 30 tablet, Rfl: 10 .  nitroGLYCERIN (NITROSTAT) 0.4 MG SL tablet, Place 1 tablet (0.4 mg total) under the tongue every 5 (five) minutes as needed., Disp: 25 tablet, Rfl: 2 .  nystatin cream (MYCOSTATIN), 1 application daily as needed., Disp: , Rfl: 0 .  potassium chloride SA (K-DUR,KLOR-CON) 20 MEQ tablet, Take 1 tablet (20 mEq total) by mouth daily., Disp: 30 tablet, Rfl: 11 .  pyridOXINE (VITAMIN B-6) 100 MG tablet, Take daily to help with nerve health, Disp: , Rfl:  .  rosuvastatin (CRESTOR) 20 MG tablet, Take 1 tablet (20 mg total) by mouth every evening., Disp: 30 tablet, Rfl: 11 .  torsemide (DEMADEX) 20 MG tablet, take 1 tablet by mouth twice a day, Disp: 60 tablet, Rfl: 10 .  traMADol (ULTRAM) 50 MG tablet, , Disp: , Rfl:    Allergies  Allergen Reactions  . Ibuprofen Other (See Comments)    Stomach pain   Review of Systems   Pertinent items are noted in the HPI. Otherwise, ROS is negative.  Vitals:   Vitals:   12/10/16 1045  BP: 114/62  Pulse: 94  Temp: (!) 97.4 F (36.3 C)  TempSrc: Oral  SpO2: 95%  Weight: 292 lb 3.2 oz (132.5 kg)  Height: 5\' 9"  (1.753 m)     Body mass index is 43.15 kg/m.   Physical Exam:   Physical Exam  Constitutional: He is oriented to person, place, and time. He appears well-developed and well-nourished. No distress.  HENT:  Head: Normocephalic and atraumatic.  Nose: Nose normal.  Mouth/Throat: Oropharynx is clear and moist.  Eyes: Pupils are equal, round, and reactive to light. Conjunctivae and EOM are normal.  Neck: Normal range of motion. Neck supple.  Cardiovascular: Normal rate, regular rhythm, normal heart sounds and intact distal pulses.   Pulmonary/Chest: Effort normal and  breath sounds normal.  Abdominal: Soft. Bowel sounds are normal.  Musculoskeletal: Normal range of motion.  Neurological: He is alert and oriented to person, place, and time.  Skin: Skin is warm and dry.  Left foot, second toenail removed recently - healing well without signs of infection.  Psychiatric: He has a normal mood and affect. His behavior is normal. Judgment and thought content normal.  Nursing note and vitals reviewed.   Results for orders placed or performed in visit on 06/18/16  POCT glycosylated hemoglobin (Hb A1C)  Result Value Ref Range   Hemoglobin A1C 9.9    Assessment and Plan:   Diagnoses and all orders for this visit:  Anxiety -     LORazepam (ATIVAN) 0.5 MG tablet; Take 1 tablet (0.5 mg total) by mouth 2 (two) times daily as needed for anxiety.  Coronary artery disease of bypass graft of native heart with stable angina  pectoris (HCC) -     nitroGLYCERIN (NITROSTAT) 0.4 MG SL tablet; Place 1 tablet (0.4 mg total) under the tongue every 5 (five) minutes as needed.  Need for immunization against influenza -     Flu Vaccine QUAD 36+ mos IM  Asymptomatic varicose veins of both lower extremities -     Ambulatory referral to Vascular Surgery   . Reviewed expectations re: course of current medical issues. . Discussed self-management of symptoms. . Outlined signs and symptoms indicating need for more acute intervention. . Patient verbalized understanding and all questions were answered. Marland Kitchen Health Maintenance issues including appropriate healthy diet, exercise, and smoking avoidance were discussed with patient. . See orders for this visit as documented in the electronic medical record. . Patient received an After Visit Summary.  CMA served as Education administrator during this visit. History, Physical, and Plan performed by medical provider. The above documentation has been reviewed and is accurate and complete. Briscoe Deutscher, D.O.  Briscoe Deutscher, DO [place], [place] 12/11/2016  Future Appointments Date Time Provider [place]  02/09/2017 11:30 AM Baird Lyons D, MD [facility] None  03/13/2017 1:20 PM Briscoe Deutscher, DO [facility] None

## 2016-12-10 NOTE — Progress Notes (Deleted)
[name] [name] is a 61 y.o. male is here for follow up.  History of Present Illness:   HPI:  Health Maintenance Due  Topic Date Due  . HIV Screening  01/16/1969  . TETANUS/TDAP  01/16/1973  . COLONOSCOPY  01/17/2004  . PNEUMOCOCCAL POLYSACCHARIDE VACCINE (2) 06/26/2013  . INFLUENZA VACCINE  09/10/2016   Depression screen PHQ 2/9 03/24/2016  Decreased Interest 0  Down, Depressed, Hopeless 0  PHQ - 2 Score 0   PMHx, SurgHx, SocialHx, FamHx, Medications, and Allergies were reviewed in the Visit Navigator and updated as appropriate.   Patient Active Problem List   Diagnosis Date Noted  . Coronary artery disease involving native coronary artery of native heart without angina pectoris 10/29/2016  . Seasonal and perennial allergic rhinitis 07/17/2016  . Radicular pain in left arm 03/27/2016  . Ulnar neuritis, left 03/27/2016  . Osteoarthritis of spine with radiculopathy, cervical region 03/27/2016  . Diastolic CHF, chronic ([place]) 03/24/2016  . Edema 08/12/2010  . PVD (peripheral vascular disease) ([place]) 04/30/2009  . Morbid obesity ([place]) 12/26/2008  . 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 HLD (hyperlipidemia) 01/04/2007  . Acute MI ([place]) 01/04/2007  . Bronchitis 01/04/2007  . Hepatic cirrhosis ([place]) 01/04/2007  . Pancreatitis 01/04/2007  . Obstructive sleep apnea 01/04/2007  . HTN (hypertension) 01/04/2007   Social History  Substance Use Topics  . Smoking status: Former Smoker    Quit date: 10/20/2004  . Smokeless tobacco: Never Used  . Alcohol use Yes     Comment: occasional   Current Medications and Allergies:   Current Outpatient Prescriptions:  .  ACCU-CHEK AVIVA PLUS test strip, , Disp: , Rfl:  .  albuterol (PROVENTIL HFA;VENTOLIN HFA) 108 (90 Base) MCG/ACT inhaler, Inhale 2 puffs into the lungs every 6 (six) hours as needed for wheezing or shortness of breath., Disp: 1 Inhaler, Rfl: 12 .  albuterol (PROVENTIL)  (2.5 MG/3ML) 0.083% nebulizer solution, Take 3 mLs (2.5 mg total) by nebulization every 6 (six) hours as needed for wheezing or shortness of breath., Disp: 75 mL, Rfl: prn .  aspirin EC 81 MG tablet, Take 81 mg by mouth daily. , Disp: , Rfl:  .  B-D ULTRAFINE III SHORT PEN 31G X 8 MM MISC, , Disp: , Rfl: 1 .  eplerenone (INSPRA) 25 MG tablet, take 1 tablet by mouth once daily, Disp: 30 tablet, Rfl: 11 .  fluticasone (FLONASE) 50 MCG/ACT nasal spray, Place 1 spray into both nostrils daily., Disp: 16 g, Rfl: 12 .  HUMALOG KWIKPEN 200 UNIT/ML SOPN, Inject 10-30 Units into the skin 3 (three) times daily with meals. 20 units with smaller meals 30 units with regular meals and 10 units with snacks. Sliding Scale, Disp: , Rfl: 0 .  ipratropium (ATROVENT) 0.02 % nebulizer solution, Take 2.5 mLs (0.5 mg total) by nebulization every 6 (six) hours as needed for wheezing or shortness of breath., Disp: 75 mL, Rfl: prn .  JARDIANCE 25 MG TABS, Take 25 mg by mouth daily. , Disp: , Rfl:  .  Lancets (ACCU-CHEK MULTICLIX) lancets, , Disp: , Rfl:  .  LANTUS SOLOSTAR 100 UNIT/ML Solostar Pen, Inject 70 Units into the skin every morning. , Disp: , Rfl: 0 .  losartan (COZAAR) 25 MG tablet, Take 25 mg by mouth daily., Disp: , Rfl: 0 .  methylPREDNISolone (MEDROL DOSEPAK) 4 MG TBPK tablet, Take by mouth as directed. Take 6 tablets on the first day prescribed then as directed., Disp: 21  tablet, Rfl: 0 .  metoprolol tartrate (LOPRESSOR) 50 MG tablet, take 1 tablet by mouth once daily, Disp: 30 tablet, Rfl: 10 .  nitroGLYCERIN (NITROSTAT) 0.4 MG SL tablet, Place 1 tablet (0.4 mg total) under the tongue every 5 (five) minutes as needed., Disp: 25 tablet, Rfl: 2 .  nystatin cream (MYCOSTATIN), 1 application daily as needed., Disp: , Rfl: 0 .  potassium chloride SA (K-DUR,KLOR-CON) 20 MEQ tablet, Take 1 tablet (20 mEq total) by mouth daily., Disp: 30 tablet, Rfl: 11 .  pyridOXINE (VITAMIN B-6) 100 MG tablet, Take daily to help  with nerve health, Disp: , Rfl:  .  rosuvastatin (CRESTOR) 20 MG tablet, Take 1 tablet (20 mg total) by mouth every evening., Disp: 30 tablet, Rfl: 11 .  torsemide (DEMADEX) 20 MG tablet, take 1 tablet by mouth twice a day, Disp: 60 tablet, Rfl: 10 .  traMADol (ULTRAM) 50 MG tablet, , Disp: , Rfl:   Allergies  Allergen Reactions  . Ibuprofen Other (See Comments)    Stomach pain   Review of Systems   Pertinent items are noted in the HPI. Otherwise, ROS is negative.  Vitals:   Vitals:   12/10/16 1045  BP: 114/62  Pulse: 94  Temp: (!) 97.4 F (36.3 C)  TempSrc: Oral  SpO2: 95%  Weight: 292 lb 3.2 oz (132.5 kg)  Height: 5\' 9"  (1.753 m)     Body mass index is 43.15 kg/m.   Physical Exam:   Physical Exam   Results for orders placed or performed in visit on 06/18/16  POCT glycosylated hemoglobin (Hb A1C)  Result Value Ref Range   Hemoglobin A1C 9.9    Assessment and Plan:   There are no diagnoses linked to this encounter.  . Reviewed expectations re: course of current medical issues. . Discussed self-management of symptoms. . Outlined signs and symptoms indicating need for more acute intervention. . Patient verbalized understanding and all questions were answered. Marland Kitchen Health Maintenance issues including appropriate healthy diet, exercise, and smoking avoidance were discussed with patient. . See orders for this visit as documented in the electronic medical record. . Patient received an After Visit Summary.   Briscoe Deutscher, DO [place], [place] 12/10/2016  Future Appointments Date Time Provider [place]  02/09/2017 11:30 AM Deneise Lever, MD [facility] None

## 2016-12-16 ENCOUNTER — Telehealth: Payer: Self-pay | Admitting: Family Medicine

## 2016-12-16 MED ORDER — LOSARTAN POTASSIUM 25 MG PO TABS: 25.0000 mg | ORAL_TABLET | Freq: Every day | ORAL | 2 refills | Status: DC

## 2016-12-16 NOTE — Telephone Encounter (Signed)
Please advise on refill. Historical provider.  

## 2016-12-16 NOTE — Telephone Encounter (Signed)
MEDICATION:  losartan (COZAAR) 25 MG tablet PHARMACY:   [facility] - [place], [place] - [place] [phone] (Phone) [phone] (Fax)    IS THIS A 90 DAY SUPPLY : Y  IS PATIENT OUT OF MEDICATION: Y  IF NOT; HOW MUCH IS LEFT:   LAST APPOINTMENT DATE: @10 /31/2018  NEXT APPOINTMENT DATE:@2 /02/2017  OTHER COMMENTS:    **Let patient know to contact pharmacy at the end of the day to make sure medication is ready. **  ** Please notify patient to allow 48-72 hours to process**  **Encourage patient to contact the pharmacy for refills or they can request refills through [facility]**

## 2016-12-16 NOTE — Telephone Encounter (Signed)
Prescription sent to pharmacy.

## 2016-12-16 NOTE — Telephone Encounter (Signed)
Okay refill. 

## 2017-01-21 ENCOUNTER — Ambulatory Visit (INDEPENDENT_AMBULATORY_CARE_PROVIDER_SITE_OTHER): Payer: Medicare Other

## 2017-01-21 ENCOUNTER — Encounter: Payer: Self-pay | Admitting: Sports Medicine

## 2017-01-21 ENCOUNTER — Ambulatory Visit: Payer: Self-pay

## 2017-01-21 ENCOUNTER — Ambulatory Visit (INDEPENDENT_AMBULATORY_CARE_PROVIDER_SITE_OTHER): Payer: Medicare Other | Admitting: Sports Medicine

## 2017-01-21 VITALS — BP 112/68 | HR 100 | Ht 69.0 in | Wt 288.8 lb

## 2017-01-21 DIAGNOSIS — M25562 Pain in left knee: Secondary | ICD-10-CM | POA: Diagnosis not present

## 2017-01-21 DIAGNOSIS — M1990 Unspecified osteoarthritis, unspecified site: Secondary | ICD-10-CM | POA: Insufficient documentation

## 2017-01-21 DIAGNOSIS — M170 Bilateral primary osteoarthritis of knee: Secondary | ICD-10-CM | POA: Diagnosis not present

## 2017-01-21 DIAGNOSIS — M25462 Effusion, left knee: Secondary | ICD-10-CM

## 2017-01-21 NOTE — Progress Notes (Signed)
[name] [name]. [name] [name], [facility]  [name] [name] - 61 y.o. male MRN [id number]  Date of birth: 12-02-1955   Scribe for today's visit: Wendy Poet, ATC    SUBJECTIVE:  [name] [name] is here for Initial Assessment (L knee pain) .    His L knee symptoms INITIALLY: Began approximately a year ago w/ no MOI. Described as mild pain but no significant swelling.  Pain radiates to the L lower leg Worsened with weight bearing, walking greater than 20-30 min Improved with rest and sometimes w/ Tylenol Additional associated symptoms include: none noted specific to his current L knee issues    At this time symptoms show no change compared to onset. He has been rest, Tylenol and topical pain cream.  Hx of 2 knee surgeries to remove "cartilage" (menisectomies?)   ROS Reports night time disturbances. Denies fevers, chills, or night sweats. Denies unexplained weight loss. Denies personal history of cancer. Denies changes in bowel or bladder habits. Reports recent unreported falls.  Slipped and fell on the ice. Denies new or worsening dyspnea or wheezing. Denies headaches or dizziness.  Reports numbness, tingling or weakness  In the extremities.  Denies dizziness or presyncopal episodes Reports lower extremity edema     HISTORY & PERTINENT PRIOR DATA:  Prior History reviewed and updated per electronic medical record.  Significant history, findings, studies and interim changes include:  reports that he quit smoking about 12 years ago. he has never used smokeless tobacco. Recent Labs    06/18/16 1354  HGBA1C 9.9   No specialty comments available. Problem  Primary Osteoarthritis of Both Knees     OBJECTIVE:  VS:  HT:5\' 9"  (175.3 cm)   WT:288 lb 12.8 oz (131 kg)  BMI:42.63    BP:112/68  HR:100bpm  TEMP: ( )  RESP:95 %  PHYSICAL EXAM: Constitutional: WDWN, Non-toxic appearing. Psychiatric: Alert &  appropriately interactive. Not depressed or anxious appearing. Respiratory: No increased work of breathing. Trachea Midline Eyes: Pupils are equal. EOM intact without nystagmus. No scleral icterus Cardiovascular:  Peripheral Pulses: peripheral pulses symmetrical 2+ pitting edema bilaterally. No clubbing or cyanosis appreciated Capillary Refill is normal, less than 2 seconds Sensory Exam: intact to light touch  Left Knee Exam: Generalized eczematous rashes of the entire lower legs.  No open ulcerations or lesions. Alignment & Contours: normal Gait: normal and wide-based Effusion: small   Generalized Synovitis: moderate  Tenderness to palpation over: Generalized with medial greater than lateral.  Pain with patellar facet palpation ROM: normal, full and No significant limitations Strength: normal and No significant weakness, extensor mechanism intact Ligamentous testing: Stable to varus/valgus strain Stable to anterior/posterior drawer Solid Lachman's Mcmurray's: Positive Medial and Positive Lateral   ASSESSMENT & PLAN:   1. Left knee pain, unspecified chronicity   2. Primary osteoarthritis of both knees   3. Effusion of left knee    PLAN:  Ultrasound-guided injection performed today.  ++++++++++++++++++++++++++++++++++++++++++++ Orders & Meds: Orders Placed This Encounter  Procedures  . DG Knee 1-2 Views Left  . US GUIDED NEEDLE PLACEMENT(NO LINKED CHARGES)    No orders of the defined types were placed in this encounter.   ++++++++++++++++++++++++++++++++++++++++++++ Follow-up: Return in about 10 weeks (around 04/01/2017).   Pertinent documentation may be included in additional procedure notes, imaging studies, problem based documentation and patient instructions. Please see these sections of the encounter for additional information regarding this visit. CMA/ATC served as Education administrator during this  visit. History, Physical, and Plan performed by medical provider. Documentation and  orders reviewed and attested to.      Gerda Diss, [facility] Physician

## 2017-01-21 NOTE — Procedures (Signed)
PROCEDURE NOTE -  ULTRASOUND GUIDEDInjection: Left knee Images were obtained and interpreted by myself, Teresa Coombs, DO  Images have been saved and stored to PACS system. Images obtained on: GE S7 Ultrasound machine  ULTRASOUND FINDINGS:  Moderate degree of synovitis with no significant effusion.  DESCRIPTION OF PROCEDURE:  The patient's clinical condition is marked by substantial pain and/or significant functional disability. Other conservative therapy has not provided relief, is contraindicated, or not appropriate. There is a reasonable likelihood that injection will significantly improve the patient's pain and/or functional impairment.  After discussing the risks, benefits and expected outcomes of the injection and all questions were reviewed and answered, the patient wished to undergo the above named procedure. Verbal consent was obtained.  The ultrasound was used to identify the target structure and adjacent neurovascular structures. The skin was then prepped in sterile fashion and the target structure was injected under direct visualization using sterile technique as below:  Left PREP: Alcohol, Ethel Chloride,  APPROACH: superiolateral, single injection, 25g 1.5in. INJECTATE:  2cc 0.5% marcaine, 2cc 40mg /mL DepoMedrol  ASPIRATE: N/A DRESSING: Band-Aid     Post procedural instructions including recommending icing and warning signs for infection were reviewed.  This procedure was well tolerated and there were no complications.   IMPRESSION: Succesful US Guided Injection

## 2017-01-21 NOTE — Patient Instructions (Signed)

## 2017-02-05 ENCOUNTER — Encounter: Payer: Medicare Other | Admitting: Vascular Surgery

## 2017-02-09 ENCOUNTER — Ambulatory Visit: Payer: Medicare Other | Admitting: Internal Medicine

## 2017-03-04 ENCOUNTER — Telehealth: Payer: Self-pay | Admitting: Cardiology

## 2017-03-04 NOTE — Telephone Encounter (Signed)
Incoming call from the patient. He stated that he has been having increased anxiety lately which is causing him to have dizziness and heart rates around the 120's. He stated that when he sits down, relaxes and takes deep breaths that his heart rate calms down and he feels better. He has been advised to call his PCP and discuss the anxiety with them. He stated that he was waiting on a return phone call from them. He will call back with any further needs.

## 2017-03-10 ENCOUNTER — Ambulatory Visit (INDEPENDENT_AMBULATORY_CARE_PROVIDER_SITE_OTHER): Payer: Medicare Other | Admitting: Orthopaedic Surgery

## 2017-03-10 ENCOUNTER — Encounter (INDEPENDENT_AMBULATORY_CARE_PROVIDER_SITE_OTHER): Payer: Self-pay | Admitting: Orthopaedic Surgery

## 2017-03-10 ENCOUNTER — Encounter: Payer: Medicare Other | Admitting: Vascular Surgery

## 2017-03-10 ENCOUNTER — Ambulatory Visit (INDEPENDENT_AMBULATORY_CARE_PROVIDER_SITE_OTHER): Payer: Medicare Other

## 2017-03-10 VITALS — BP 128/78 | HR 97 | Ht 68.0 in | Wt 285.0 lb

## 2017-03-10 DIAGNOSIS — M4722 Other spondylosis with radiculopathy, cervical region: Secondary | ICD-10-CM

## 2017-03-10 DIAGNOSIS — M25511 Pain in right shoulder: Secondary | ICD-10-CM

## 2017-03-12 ENCOUNTER — Ambulatory Visit: Payer: Self-pay | Admitting: *Deleted

## 2017-03-12 NOTE — Telephone Encounter (Signed)
Pt   Has  Sinus  Congestion  Nasal  Stuffiness      Has  Been  Using   flonase   As   Well   As   Tylenol    He  Has  A  Slight headache   With  Stuffy  Nose  Alternating  With  Runny  Nose    He   States  He  Uses  Bi pap  And  Has  Had   5  Open  Heart   operation  In past   -  Pt had  An  appt  tommorow  With  Dr  Juleen China  But it  Was  Canceled by office .Ellie   AT  [place]  Notified .  appt  Made  DR [name]  For 130  Pm  Pt  Advised  Drink lots  Water  Avoid  NSAID   And  Decongestants    Reason for Disposition . [1] Using nasal washes and pain medicine > 24 hours AND [2] sinus pain (around cheekbone or eye) persists  Answer Assessment - Initial Assessment Questions 1. ONSET: "When did the cough begin?"        Pt   Reports   Dry  Cough   Off    And  On  Yesterday       2. SEVERITY: "How bad is the cough today?"           Cough  Is  Not  Bad    Has   Nasal  Stuffiness         3. RESPIRATORY DISTRESS: "Describe your breathing."         Breathing  Ok     4. FEVER: "Do you have a fever?" If so, ask: "What is your temperature, how was it measured, and when did it start?"         98.7   Orally     5. HEMOPTYSIS: "Are you coughing up any blood?" If so ask: "How much?" (flecks, streaks, tablespoons, etc.)         No  hemoptysis    6. TREATMENT: "What have you done so far to treat the cough?" (e.g., meds, fluids, humidifier)         Pt  Reports   Drinking  Lots  Of  Liquids  And  Taking  Tylenol     7. CARDIAC HISTORY: "Do you have any history of heart disease?" (e.g., heart attack, congestive heart failure)           Pt  Has  A  History   5    Bypass   Surgery     8. LUNG HISTORY: "Do you have any history of lung disease?"  (e.g., pulmonary embolus, asthma, emphysema)          USES BI PAP   9. PE RISK FACTORS: "Do you have a history of blood clots?" (or: recent major surgery, recent prolonged travel, bedridden )         No   10. OTHER SYMPTOMS: "Do you have any other symptoms? (e.g.,  runny nose, wheezing, chest pain)         Stuffy  Nose    Alternates  With  Runny  Nose    11. PREGNANCY: "Is there any chance you are pregnant?" "When was your last menstrual period?"       no 12. TRAVEL: "Have you traveled out of the country in  the last month?" (e.g., travel history, exposures)       no  Protocols used: COUGH - ACUTE NON-PRODUCTIVE-A-AH

## 2017-03-13 ENCOUNTER — Ambulatory Visit (INDEPENDENT_AMBULATORY_CARE_PROVIDER_SITE_OTHER): Payer: Medicare Other | Admitting: Family Medicine

## 2017-03-13 ENCOUNTER — Encounter: Payer: Self-pay | Admitting: Family Medicine

## 2017-03-13 ENCOUNTER — Ambulatory Visit: Payer: Medicare Other | Admitting: Family Medicine

## 2017-03-13 VITALS — BP 128/74 | HR 103 | Temp 98.6°F | Ht 68.0 in | Wt 296.0 lb

## 2017-03-13 DIAGNOSIS — R059 Cough, unspecified: Secondary | ICD-10-CM

## 2017-03-13 DIAGNOSIS — R52 Pain, unspecified: Secondary | ICD-10-CM

## 2017-03-13 DIAGNOSIS — R05 Cough: Secondary | ICD-10-CM

## 2017-03-13 DIAGNOSIS — F419 Anxiety disorder, unspecified: Secondary | ICD-10-CM

## 2017-03-13 DIAGNOSIS — R0981 Nasal congestion: Secondary | ICD-10-CM

## 2017-03-13 LAB — POCT INFLUENZA A/B
Influenza A, POC: NEGATIVE
Influenza B, POC: NEGATIVE

## 2017-03-13 MED ORDER — IPRATROPIUM BROMIDE 0.06 % NA SOLN
2.0000 | Freq: Four times a day (QID) | NASAL | 0 refills | Status: DC
Start: 2017-03-13 — End: 2019-04-04

## 2017-03-13 MED ORDER — AZITHROMYCIN 250 MG PO TABS: ORAL_TABLET | ORAL | 0 refills | Status: DC

## 2017-03-13 MED ORDER — GUAIFENESIN-CODEINE 100-10 MG/5ML PO SOLN: 5.0000 mL | Freq: Three times a day (TID) | ORAL | 0 refills | Status: DC | PRN

## 2017-03-13 NOTE — Patient Instructions (Signed)
Start the atrovent.  Start cough syrup for your cough.  Start the zpack if your symptoms worsen or do not improve in a few days.  Please stay well hydrated.  Please let me know if your symptoms worsen or fail to improve.  Take care, Dr [name]

## 2017-03-13 NOTE — Progress Notes (Signed)
    Subjective:  [name] [name] is a 62 y.o. male who presents today for same-day appointment with a chief complaint of cough.   HPI:  Cough, Acute Issue Started about 2 days ago.  Worsened over that time.  Associated with rhinorrhea, sore throat, malaise, and headache.  Has not tried any treatments.  No fevers or chills.  Concerned that he may have flu as he has had several relatives that were recently diagnosed with this.  No treatments tried.  No obvious precipitating events.  No other obvious alleviating or aggravating factors.  Anxiety, established problem, stable Patient with several year history of anxiety.  Notes that he had a panic attack just prior to his visit today due to concern and stress over transportation and worry about being late.  Currently, his symptoms are much improved.  Denies any SI or HI.  He is not currently taking anything for his anxiety.  ROS: Per HPI  PMH: He reports that he quit smoking about 12 years ago. he has never used smokeless tobacco. He reports that he drinks alcohol. He reports that he does not use drugs.  Objective:  Physical Exam: BP 128/74 (BP Location: Right Arm, Patient Position: Sitting, Cuff Size: Large)   Pulse (!) 103   Temp 98.6 F (37 C) (Oral)   Ht 5\' 8"  (1.727 m)   Wt 296 lb (134.3 kg)   SpO2 93%   BMI 45.01 kg/m   Gen: NAD, resting comfortably HEENT: TMs clear bilaterally.  Nasal mucosa erythematous with clear nasal discharge.  Maxillary sinuses with normal transillumination bilaterally.  Oropharynx slightly erythematous with no exudate.  Moist mucous membranes.  No lymphadenopathy. CV: RRR with no murmurs appreciated Pulm: NWOB, CTAB with no crackles, wheezes, or rhonchi  Rapid flu negative  Assessment/Plan:  Cough Likely secondary to viral URI. No signs of bacterial infection. Start atrovent for rhinorrhea/sinus congestion. Start guaifenesin-codeine cough syrup for cough. Sent in a "pocket prescription" for azithromycin  with strict instruction to not start unless symptoms worse or fail to improve within the next several days.  Encouraged good oral hydration. Return precautions reviewed. Follow up as needed.   Anxiety Currently stable. Advised close follow up with PCP to discuss treatment options.   Algis Greenhouse. [name], MD 03/13/2017 2:44 PM

## 2017-03-13 NOTE — Telephone Encounter (Signed)
Noted  

## 2017-03-18 ENCOUNTER — Telehealth: Payer: Self-pay | Admitting: Family Medicine

## 2017-03-18 NOTE — Telephone Encounter (Signed)
Please advise if the patient can be worked in or needs to select another day next week to come in. This is time sensitive due to the patient's brother coming on Friday this week.  Copied from [place]. Topic: General - Other >> Mar 18, 2017  2:30 PM Ivar Drape wrote: Reason for CRM:   Patient needs to come in to discuss his medication and he wants to come in with his brother, Nissim Fleischer, who has an appt on 03/20/17 at 2pm.  He wants to know if he can be worked in with his brother's appt because they live so far and want to come together.  Please advise.

## 2017-03-18 NOTE — Telephone Encounter (Signed)
Please advise no open app

## 2017-03-19 NOTE — Telephone Encounter (Signed)
Called patient gave app informed to be here early and bring list of issues.

## 2017-03-19 NOTE — Telephone Encounter (Signed)
Okay to put both of them, just makes sure to tell them to get here early and it would be best if they could have their list of issues to discuss written for [place] to be efficient.

## 2017-03-20 ENCOUNTER — Ambulatory Visit: Payer: Medicare Other | Admitting: Family Medicine

## 2017-03-20 ENCOUNTER — Encounter: Payer: Self-pay | Admitting: Family Medicine

## 2017-03-20 ENCOUNTER — Ambulatory Visit (INDEPENDENT_AMBULATORY_CARE_PROVIDER_SITE_OTHER): Payer: Medicare Other | Admitting: Family Medicine

## 2017-03-20 VITALS — BP 130/74 | HR 72 | Temp 97.6°F | Wt 294.4 lb

## 2017-03-20 DIAGNOSIS — G63 Polyneuropathy in diseases classified elsewhere: Secondary | ICD-10-CM | POA: Insufficient documentation

## 2017-03-20 DIAGNOSIS — R079 Chest pain, unspecified: Secondary | ICD-10-CM | POA: Diagnosis not present

## 2017-03-20 DIAGNOSIS — I2510 Atherosclerotic heart disease of native coronary artery without angina pectoris: Secondary | ICD-10-CM

## 2017-03-20 DIAGNOSIS — F418 Other specified anxiety disorders: Secondary | ICD-10-CM

## 2017-03-20 DIAGNOSIS — I10 Essential (primary) hypertension: Secondary | ICD-10-CM | POA: Diagnosis not present

## 2017-03-20 DIAGNOSIS — I5032 Chronic diastolic (congestive) heart failure: Secondary | ICD-10-CM

## 2017-03-20 DIAGNOSIS — E1142 Type 2 diabetes mellitus with diabetic polyneuropathy: Secondary | ICD-10-CM | POA: Insufficient documentation

## 2017-03-20 DIAGNOSIS — E118 Type 2 diabetes mellitus with unspecified complications: Secondary | ICD-10-CM

## 2017-03-20 DIAGNOSIS — E782 Mixed hyperlipidemia: Secondary | ICD-10-CM

## 2017-03-20 DIAGNOSIS — E1149 Type 2 diabetes mellitus with other diabetic neurological complication: Secondary | ICD-10-CM

## 2017-03-20 DIAGNOSIS — IMO0001 Reserved for inherently not codable concepts without codable children: Secondary | ICD-10-CM

## 2017-03-20 DIAGNOSIS — G4733 Obstructive sleep apnea (adult) (pediatric): Secondary | ICD-10-CM

## 2017-03-20 DIAGNOSIS — K7030 Alcoholic cirrhosis of liver without ascites: Secondary | ICD-10-CM

## 2017-03-20 DIAGNOSIS — I739 Peripheral vascular disease, unspecified: Secondary | ICD-10-CM | POA: Diagnosis not present

## 2017-03-20 DIAGNOSIS — Z794 Long term (current) use of insulin: Secondary | ICD-10-CM

## 2017-03-20 LAB — COMPREHENSIVE METABOLIC PANEL
AG Ratio: 0.8 (calc) — ABNORMAL LOW (ref 1.0–2.5)
ALT: 8 U/L — ABNORMAL LOW (ref 9–46)
AST: 14 U/L (ref 10–35)
Albumin: 3.6 g/dL (ref 3.6–5.1)
Alkaline phosphatase (APISO): 88 U/L (ref 40–115)
BUN: 14 mg/dL (ref 7–25)
CO2: 30 mmol/L (ref 20–32)
Calcium: 8.9 mg/dL (ref 8.6–10.3)
Chloride: 96 mmol/L — ABNORMAL LOW (ref 98–110)
Creat: 1.01 mg/dL (ref 0.70–1.25)
Globulin: 4.3 g/dL (calc) — ABNORMAL HIGH (ref 1.9–3.7)
Glucose, Bld: 151 mg/dL — ABNORMAL HIGH (ref 65–99)
Potassium: 3 mmol/L — ABNORMAL LOW (ref 3.5–5.3)
Sodium: 138 mmol/L (ref 135–146)
Total Bilirubin: 0.6 mg/dL (ref 0.2–1.2)
Total Protein: 7.9 g/dL (ref 6.1–8.1)

## 2017-03-20 LAB — CBC WITH DIFFERENTIAL/PLATELET
Basophils Absolute: 41 cells/uL (ref 0–200)
Basophils Relative: 0.6 %
Eosinophils Absolute: 197 cells/uL (ref 15–500)
Eosinophils Relative: 2.9 %
HCT: 41.1 % (ref 38.5–50.0)
Hemoglobin: 14.9 g/dL (ref 13.2–17.1)
Lymphs Abs: 3692 cells/uL (ref 850–3900)
MCH: 32.4 pg (ref 27.0–33.0)
MCHC: 36.3 g/dL — ABNORMAL HIGH (ref 32.0–36.0)
MCV: 89.3 fL (ref 80.0–100.0)
MPV: 10.3 fL (ref 7.5–12.5)
Monocytes Relative: 9.9 %
Neutro Abs: 2196 cells/uL (ref 1500–7800)
Neutrophils Relative %: 32.3 %
Platelets: 258 10*3/uL (ref 140–400)
RBC: 4.6 10*6/uL (ref 4.20–5.80)
RDW: 12.8 % (ref 11.0–15.0)
Total Lymphocyte: 54.3 %
WBC mixed population: 673 cells/uL (ref 200–950)
WBC: 6.8 10*3/uL (ref 3.8–10.8)

## 2017-03-20 LAB — TSH: TSH: 1.71 mIU/L (ref 0.40–4.50)

## 2017-03-20 MED ORDER — FLUOXETINE HCL 20 MG PO CAPS: 20.0000 mg | ORAL_CAPSULE | Freq: Every morning | ORAL | 3 refills | Status: DC

## 2017-03-20 NOTE — Progress Notes (Signed)
[name] [name] is a 62 y.o. male is here for follow up.  History of Present Illness:   [name] [name], CMA acting as scribe for Dr. Briscoe Deutscher.   HPI: Patient complains that when he gets up fast or is out in the yard working that he feels like his heart is beating real fast. He has checked it and it can be anywhere from 90 to 130. But if he will let himself calm down with will bo back to normal. He called cardiologist and told him to come here and be evaluated for anxiety.  Health Maintenance Due  Topic Date Due  . HIV Screening  01/16/1969  . TETANUS/TDAP  01/16/1973  . COLONOSCOPY  01/17/2004  . PNEUMOCOCCAL POLYSACCHARIDE VACCINE (2) 06/26/2013  . HEMOGLOBIN A1C  12/19/2016   Depression screen PHQ 2/9 03/24/2016  Decreased Interest 0  Down, Depressed, Hopeless 0  PHQ - 2 Score 0   PMHx, SurgHx, SocialHx, FamHx, Medications, and Allergies were reviewed in the Visit Navigator and updated as appropriate.   Patient Active Problem List   Diagnosis Date Noted  . Primary osteoarthritis of both knees 01/21/2017  . Coronary artery disease involving native coronary artery of native heart without angina pectoris 10/29/2016  . Seasonal and perennial allergic rhinitis 07/17/2016  . Radicular pain in left arm 03/27/2016  . Ulnar neuritis, left 03/27/2016  . Osteoarthritis of spine with radiculopathy, cervical region 03/27/2016  . Diastolic CHF, chronic ([place]) 03/24/2016  . Edema 08/12/2010  . PVD (peripheral vascular disease) ([place]) 04/30/2009  . Morbid obesity ([place]) 12/26/2008  . 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 HLD (hyperlipidemia) 01/04/2007  . Acute MI ([place]) 01/04/2007  . Bronchitis 01/04/2007  . Hepatic cirrhosis ([place]) 01/04/2007  . Pancreatitis 01/04/2007  . Obstructive sleep apnea 01/04/2007  . HTN (hypertension) 01/04/2007   Social History   Tobacco Use  . Smoking status: Former Smoker    Last attempt to quit:  10/20/2004    Years since quitting: 12.4  . Smokeless tobacco: Never Used  Substance Use Topics  . Alcohol use: Yes    Comment: occasional  . Drug use: No   Current Medications and Allergies:   .  ACCU-CHEK AVIVA PLUS test strip, , Disp: , Rfl:  .  albuterol (PROVENTIL HFA;VENTOLIN HFA) 108 (90 Base) MCG/ACT inhaler, Inhale 2 puffs into the lungs every 6 (six) hours as needed for wheezing or shortness of breath., Disp: 1 Inhaler, Rfl: 12 .  albuterol (PROVENTIL) (2.5 MG/3ML) 0.083% nebulizer solution, Take 3 mLs (2.5 mg total) by nebulization every 6 (six) hours as needed for wheezing or shortness of breath., Disp: 75 mL, Rfl: prn .  aspirin EC 81 MG tablet, Take 81 mg by mouth daily. , Disp: , Rfl:  .  B-D ULTRAFINE III SHORT PEN 31G X 8 MM MISC, , Disp: , Rfl: 1 .  eplerenone (INSPRA) 25 MG tablet, take 1 tablet by mouth once daily, Disp: 30 tablet, Rfl: 11 .  fluticasone (FLONASE) 50 MCG/ACT nasal spray, Place 1 spray into both nostrils daily., Disp: 16 g, Rfl: 12 .  guaiFENesin-codeine 100-10 MG/5ML syrup, Take 5 mLs by mouth 3 (three) times daily as needed for cough., Disp: 120 mL, Rfl: 0 .  HUMALOG KWIKPEN 200 UNIT/ML SOPN, Inject 10-30 Units into the skin 3 (three) times daily with meals. 20 units with smaller meals 30 units with regular meals and 10 units with snacks. Sliding Scale, Disp: , Rfl: 0 .  ipratropium (  ATROVENT) 0.02 % nebulizer solution, Take 2.5 mLs (0.5 mg total) by nebulization every 6 (six) hours as needed for wheezing or shortness of breath., Disp: 75 mL, Rfl: prn .  ipratropium (ATROVENT) 0.06 % nasal spray, Place 2 sprays into both nostrils 4 (four) times daily., Disp: 15 mL, Rfl: 0 .  Lancets (ACCU-CHEK MULTICLIX) lancets, , Disp: , Rfl:  .  LANTUS SOLOSTAR 100 UNIT/ML Solostar Pen, Inject 70 Units into the skin every morning. , Disp: , Rfl: 0 .  LORazepam (ATIVAN) 0.5 MG tablet, Take 1 tablet (0.5 mg total) by mouth 2 (two) times daily as needed for anxiety., Disp:  30 tablet, Rfl: 1 .  losartan (COZAAR) 50 MG tablet, Take 1 tablet by mouth daily., Disp: , Rfl: 0 .  methylPREDNISolone (MEDROL DOSEPAK) 4 MG TBPK tablet, Take by mouth as directed. Take 6 tablets on the first day prescribed then as directed., Disp: 21 tablet, Rfl: 0 .  metoprolol tartrate (LOPRESSOR) 50 MG tablet, take 1 tablet by mouth once daily, Disp: 30 tablet, Rfl: 10 .  nitroGLYCERIN (NITROSTAT) 0.4 MG SL tablet, Place 1 tablet (0.4 mg total) under the tongue every 5 (five) minutes as needed., Disp: 25 tablet, Rfl: 2 .  nystatin cream (MYCOSTATIN), 1 application daily as needed., Disp: , Rfl: 0 .  potassium chloride SA (K-DUR,KLOR-CON) 20 MEQ tablet, Take 1 tablet (20 mEq total) by mouth daily., Disp: 30 tablet, Rfl: 11 .  pyridOXINE (VITAMIN B-6) 100 MG tablet, Take daily to help with nerve health, Disp: , Rfl:  .  rosuvastatin (CRESTOR) 20 MG tablet, Take 1 tablet (20 mg total) by mouth every evening., Disp: 30 tablet, Rfl: 11 .  torsemide (DEMADEX) 20 MG tablet, take 1 tablet by mouth twice a day, Disp: 60 tablet, Rfl: 10 .  traMADol (ULTRAM) 50 MG tablet, , Disp: , Rfl:   Allergies  Allergen Reactions  . Ibuprofen Other (See Comments)    Stomach pain   Review of Systems   Pertinent items are noted in the HPI. Otherwise, ROS is negative.  Vitals:   Vitals:   03/20/17 1355  BP: 130/74  Pulse: 72  Temp: 97.6 F (36.4 C)  TempSrc: Oral  SpO2: 94%  Weight: 294 lb 6.4 oz (133.5 kg)     Body mass index is 44.76 kg/m.  Physical Exam:   Physical Exam  Constitutional: He is oriented to person, place, and time. He appears well-developed and well-nourished. No distress.  HENT:  Head: Normocephalic and atraumatic.  Right Ear: External ear normal.  Left Ear: External ear normal.  Nose: Nose normal.  Mouth/Throat: Oropharynx is clear and moist.  Eyes: Conjunctivae and EOM are normal. Pupils are equal, round, and reactive to light.  Neck: Normal range of motion. Neck  supple.  Cardiovascular: Normal rate, regular rhythm, normal heart sounds and intact distal pulses.  Pulmonary/Chest: Effort normal and breath sounds normal.  Abdominal: Soft. Bowel sounds are normal.  Musculoskeletal: Normal range of motion.  Neurological: He is alert and oriented to person, place, and time.  Skin: Skin is warm and dry.  Psychiatric: He has a normal mood and affect. His behavior is normal. Judgment and thought content normal.  Nursing note and vitals reviewed.     Assessment and Plan:   1. Coronary artery disease involving native coronary artery of native heart without angina pectoris s/p CABG 2005 w/ LIMA-LAD, L rad-OM2, SVG-PDA, SVG-RI lat sub-branch-SVG-RI mid sub-branch. - Ambulatory referral to Cardiology - EKG 12-Lead above - CBC  with Differential/Platelet - Comprehensive metabolic panel  2. Lightheaded Patient originally complains of anxiety.  When discussing his episodes of anxiety he reveals that it only seems to happen when he goes into his front yard to do yard work.  He becomes lightheaded he has intermittently had chest tightness.  No syncope.  No overt chest pain.  He has not checked his blood pressure, heart rate, or blood sugars during these episodes.  He did call his cardiologist's office a few weeks ago with this concern.  He complained of anxiety, so was told to follow-up with me instead of getting an appointment with cardiology.  While his symptoms may be related to anxiety, hypoglycemia, or number of other factors with his multiple medical issues, I am concerned that he should see cardiology again to establish whether or not he needs new cardiac testing.  3. Depression with anxiety EKG above. I've asked him to hold the  - FLUoxetine (PROZAC) 20 MG capsule; Take 1 capsule (20 mg total) by mouth every morning.  Dispense: 90 capsule; Refill: 3 - TSH; Future  4. Obstructive sleep apnea Using CPAP.  5. Polyneuropathy associated with underlying disease  ([place]) Stable.  6. Alcoholic cirrhosis of liver without ascites ([place])  Lab Results  Component Value Date   ALT 10 03/24/2016   AST 17 03/24/2016   ALKPHOS 80 03/24/2016   BILITOT 0.6 03/24/2016   7. Diastolic CHF, chronic (HCC)  8. Type 2 diabetes mellitus with complication, without long-term current use of insulin ([place]) Followed by Endocrinology.  Current symptoms: no hypoglycemia, no medication side effects noted, has dysesthesias in the feet.  Taking medication compliantly without noted sided effects [x]   YES  []   NO  On ACE inhibitor or angiotensin II receptor blocker? [x]   YES  []   NO On Aspirin? [x]   YES  []   NO   Lab Results  Component Value Date   CHOL 154 01/08/2010   HDL 40.90 01/08/2010   LDLCALC 80 01/08/2010   TRIG 166.0 (H) 01/08/2010   CHOLHDL 4 01/08/2010     Lab Results  Component Value Date   CREATININE 1.01 03/20/2017   - CBC with Differential/Platelet - Comprehensive metabolic panel - TSH  9. Morbid obesity ([place]) The patient is asked to make an attempt to improve diet and exercise patterns to aid in medical management of this problem.   10. PVD (peripheral vascular disease) (HCC) Mild LE disease.   11. Essential hypertension Avoiding excessive salt intake? []   YES  [x]   NO Trying to exercise on a regular basis? []   YES  [x]   NO  Wt Readings from Last 3 Encounters:  03/20/17 294 lb 6.4 oz (133.5 kg)  03/13/17 296 lb (134.3 kg)  03/10/17 285 lb (129.3 kg)   BP Readings from Last 3 Encounters:  03/20/17 130/74  03/13/17 128/74  03/10/17 128/78   Lab Results  Component Value Date   CREATININE 1.01 03/20/2017   - losartan (COZAAR) 50 MG tablet; Take 1 tablet by mouth daily.; Refill: 0   . Reviewed expectations re: course of current medical issues. . Discussed self-management of symptoms. . Outlined signs and symptoms indicating need for more acute intervention. . Patient verbalized understanding and all questions were  answered. Marland Kitchen Health Maintenance issues including appropriate healthy diet, exercise, and smoking avoidance were discussed with patient. . See orders for this visit as documented in the electronic medical record. . Patient received an After Visit Summary.  CMA served as Education administrator during this  visit. History, Physical, and Plan performed by medical provider. The above documentation has been reviewed and is accurate and complete. Briscoe Deutscher, D.O.  Briscoe Deutscher, DO [place], [facility] 03/20/2017

## 2017-03-23 ENCOUNTER — Ambulatory Visit: Payer: Medicare Other | Admitting: Internal Medicine

## 2017-04-01 ENCOUNTER — Ambulatory Visit: Payer: Medicare Other | Admitting: Sports Medicine

## 2017-04-02 ENCOUNTER — Telehealth: Payer: Self-pay | Admitting: Cardiology

## 2017-04-02 NOTE — Telephone Encounter (Signed)
OK 

## 2017-04-02 NOTE — Telephone Encounter (Signed)
New message      Pt want to switch from Dr Percival Spanish to Dr Irish Lack because [address] location is closer to his home.  Is this ok?

## 2017-04-06 NOTE — Telephone Encounter (Signed)
OK with me.

## 2017-04-08 ENCOUNTER — Other Ambulatory Visit: Payer: Self-pay

## 2017-04-08 ENCOUNTER — Ambulatory Visit: Payer: Medicare Other | Admitting: Sports Medicine

## 2017-04-08 DIAGNOSIS — Z0289 Encounter for other administrative examinations: Secondary | ICD-10-CM

## 2017-04-08 DIAGNOSIS — I8390 Asymptomatic varicose veins of unspecified lower extremity: Secondary | ICD-10-CM

## 2017-04-10 ENCOUNTER — Encounter: Payer: Self-pay | Admitting: Sports Medicine

## 2017-04-13 ENCOUNTER — Ambulatory Visit (INDEPENDENT_AMBULATORY_CARE_PROVIDER_SITE_OTHER): Payer: Medicare Other | Admitting: Vascular Surgery

## 2017-04-13 ENCOUNTER — Encounter: Payer: Self-pay | Admitting: Vascular Surgery

## 2017-04-13 ENCOUNTER — Other Ambulatory Visit: Payer: Self-pay

## 2017-04-13 ENCOUNTER — Ambulatory Visit (HOSPITAL_COMMUNITY)
Admission: RE | Admit: 2017-04-13 | Discharge: 2017-04-13 | Disposition: A | Discharge status: Home / Self Care | Payer: Medicare Other | Source: Ambulatory Visit | Attending: Vascular Surgery | Admitting: Vascular Surgery

## 2017-04-13 VITALS — BP 150/86 | HR 99 | Temp 97.0°F | Resp 16 | Ht 68.0 in | Wt 295.0 lb

## 2017-04-13 DIAGNOSIS — I872 Venous insufficiency (chronic) (peripheral): Secondary | ICD-10-CM | POA: Insufficient documentation

## 2017-04-13 DIAGNOSIS — I83813 Varicose veins of bilateral lower extremities with pain: Secondary | ICD-10-CM

## 2017-04-13 DIAGNOSIS — I8390 Asymptomatic varicose veins of unspecified lower extremity: Secondary | ICD-10-CM | POA: Insufficient documentation

## 2017-04-13 DIAGNOSIS — I70219 Atherosclerosis of native arteries of extremities with intermittent claudication, unspecified extremity: Secondary | ICD-10-CM | POA: Diagnosis not present

## 2017-04-13 DIAGNOSIS — I739 Peripheral vascular disease, unspecified: Secondary | ICD-10-CM | POA: Insufficient documentation

## 2017-04-13 NOTE — Progress Notes (Addendum)
Requested by:  Briscoe Deutscher, [address] [place], [place] [address]  Reason for consultation: varicose veins, LLE claudication    History of Present Illness   [name] [name] is a 62 y.o. (Feb 02, 1956) male who presents with chief complaint: varicose veins, LLE claudication.  He is complaining of pain and swelling in bilateral lower extremities left greater than right.  He has tried compression for several days in a row which did improve on the symptoms as well as the swelling however he is no longer wearing these compression socks on a daily basis.  He denies any history of venous ulcerations or DVT.  Surgical history significant for coronary artery bypass grafting with harvested right greater saphenous vein.  He is on daily torsemide prescribed by cardiology.  Patient also complains of claudication of left lower extremity.  He notices the most when walking uphill.  If he stops for several minutes he can continue the same distance before left calf pain returns.  He denies rest pain or active tissue ischemia of bilateral lower extremities.  He is taking an aspirin and statin daily.  He is a former tobacco smoker who quit in 2007.  Past Medical History:  Diagnosis Date  . Acute myocardial infarction, unspecified site, episode of care unspecified 2005  . Acute pancreatitis   . CAD (coronary artery disease)    status post CABG in 2005 w LIMA to LAD, left radial to second circumflex marginal, saphenous vein graft to PDA, saphenous vein graft to lateral subbrach of ramus intermediate, and sequential saphenous vein graft to the medial subbranch of ramus intermediate  . Cirrhosis of liver without mention of alcohol   . Complications affecting other specified body systems, hypertension   . Hyperlipidemia   . Neuropathy   . Other and unspecified hyperlipidemia   . Proteinuria   . Sleep apnea   . Type II or unspecified type diabetes mellitus without mention of complication, not stated as  uncontrolled     Past Surgical History:  Procedure Laterality Date  . CORONARY ARTERY BYPASS GRAFT    . ENT surgery      Social History   Socioeconomic History  . Marital status: Single    Spouse name: Not on file  . Number of children: Not on file  . Years of education: Not on file  . Highest education level: Not on file  Social Needs  . Financial resource strain: Not on file  . Food insecurity - worry: Not on file  . Food insecurity - inability: Not on file  . Transportation needs - medical: Not on file  . Transportation needs - non-medical: Not on file  Occupational History  . Not on file  Tobacco Use  . Smoking status: Former Smoker    Last attempt to quit: 10/20/2004    Years since quitting: 12.4  . Smokeless tobacco: Never Used  Substance and Sexual Activity  . Alcohol use: Yes    Comment: occasional  . Drug use: No  . Sexual activity: Not on file  Other Topics Concern  . Not on file  Social History Narrative   Lives alone, apartment "friends smoke". Disability. Is disabled secondary to his work-related injury.     Family History  Problem Relation Age of Onset  . Breast cancer Mother   . Prostate cancer Father   . Coronary artery disease Father        underwent CABG    Current Outpatient Medications  Medication Sig Dispense  Refill  . ACCU-CHEK AVIVA PLUS test strip     . albuterol (PROVENTIL HFA;VENTOLIN HFA) 108 (90 Base) MCG/ACT inhaler Inhale 2 puffs into the lungs every 6 (six) hours as needed for wheezing or shortness of breath. 1 Inhaler 12  . albuterol (PROVENTIL) (2.5 MG/3ML) 0.083% nebulizer solution Take 3 mLs (2.5 mg total) by nebulization every 6 (six) hours as needed for wheezing or shortness of breath. 75 mL prn  . aspirin EC 81 MG tablet Take 81 mg by mouth daily.     . B-D ULTRAFINE III SHORT PEN 31G X 8 MM MISC   1  . eplerenone (INSPRA) 25 MG tablet take 1 tablet by mouth once daily 30 tablet 11  . FLUoxetine (PROZAC) 20 MG capsule Take  1 capsule (20 mg total) by mouth every morning. 90 capsule 3  . fluticasone (FLONASE) 50 MCG/ACT nasal spray Place 1 spray into both nostrils daily. 16 g 12  . guaiFENesin-codeine 100-10 MG/5ML syrup Take 5 mLs by mouth 3 (three) times daily as needed for cough. 120 mL 0  . HUMALOG KWIKPEN 200 UNIT/ML SOPN Inject 10-30 Units into the skin 3 (three) times daily with meals. 20 units with smaller meals 30 units with regular meals and 10 units with snacks. Sliding Scale  0  . ipratropium (ATROVENT) 0.02 % nebulizer solution Take 2.5 mLs (0.5 mg total) by nebulization every 6 (six) hours as needed for wheezing or shortness of breath. 75 mL prn  . ipratropium (ATROVENT) 0.06 % nasal spray Place 2 sprays into both nostrils 4 (four) times daily. 15 mL 0  . Lancets (ACCU-CHEK MULTICLIX) lancets     . LANTUS SOLOSTAR 100 UNIT/ML Solostar Pen Inject 70 Units into the skin every morning.   0  . LORazepam (ATIVAN) 0.5 MG tablet Take 1 tablet (0.5 mg total) by mouth 2 (two) times daily as needed for anxiety. 30 tablet 1  . losartan (COZAAR) 50 MG tablet Take 1 tablet by mouth daily.  0  . methylPREDNISolone (MEDROL DOSEPAK) 4 MG TBPK tablet Take by mouth as directed. Take 6 tablets on the first day prescribed then as directed. 21 tablet 0  . metoprolol tartrate (LOPRESSOR) 50 MG tablet take 1 tablet by mouth once daily 30 tablet 10  . nitroGLYCERIN (NITROSTAT) 0.4 MG SL tablet Place 1 tablet (0.4 mg total) under the tongue every 5 (five) minutes as needed. 25 tablet 2  . nystatin cream (MYCOSTATIN) 1 application daily as needed.  0  . potassium chloride SA (K-DUR,KLOR-CON) 20 MEQ tablet Take 1 tablet (20 mEq total) by mouth daily. 30 tablet 11  . pyridOXINE (VITAMIN B-6) 100 MG tablet Take daily to help with nerve health    . rosuvastatin (CRESTOR) 20 MG tablet Take 1 tablet (20 mg total) by mouth every evening. 30 tablet 11  . torsemide (DEMADEX) 20 MG tablet take 1 tablet by mouth twice a day 60 tablet 10  .  traMADol (ULTRAM) 50 MG tablet      No current facility-administered medications for this visit.     Allergies  Allergen Reactions  . Ibuprofen Other (See Comments)    Stomach pain    REVIEW OF SYSTEMS (negative unless checked):   Cardiac:  []  Chest pain or chest pressure? []  Shortness of breath upon activity? []  Shortness of breath when lying flat? []  Irregular heart rhythm?  Vascular:  []  Pain in calf, thigh, or hip brought on by walking? []  Pain in feet at night that wakes  you up from your sleep? []  Blood clot in your veins? []  Leg swelling?  Pulmonary:  []  Oxygen at home? []  Productive cough? []  Wheezing?  Neurologic:  []  Sudden weakness in arms or legs? []  Sudden numbness in arms or legs? []  Sudden onset of difficult speaking or slurred speech? []  Temporary loss of vision in one eye? []  Problems with dizziness?  Gastrointestinal:  []  Blood in stool? []  Vomited blood?  Genitourinary:  []  Burning when urinating? []  Blood in urine?  Psychiatric:  []  Major depression  Hematologic:  []  Bleeding problems? []  Problems with blood clotting?  Dermatologic:  []  Rashes or ulcers?  Constitutional:  []  Fever or chills?  Ear/Nose/Throat:  []  Change in hearing? []  Nose bleeds? []  Sore throat?  Musculoskeletal:  []  Back pain? []  Joint pain? []  Muscle pain?   Physical Examination     Vitals:   04/13/17 1441  BP: (!) 150/86  Pulse: 99  Resp: 16  Temp: (!) 97 F (36.1 C)  TempSrc: Oral  SpO2: 94%  Weight: 295 lb (133.8 kg)  Height: 5\' 8"  (1.727 m)   Body mass index is 44.85 kg/m.  General Alert, O x 3, Obese, NAD  Head [place]/AT,    Ear/Nose/ Throat Hearing grossly intact, nares without erythema or drainage      Neck Supple, mid-line trachea,    Pulmonary Sym exp, good B air movt, CTA B  Cardiac RRR, Nl S1, S2,   Vascular Vessel Right Left  Radial Palpable Palpable  Brachial Palpable Palpable  Carotid Palpable, No Bruit Palpable, No Bruit   Aorta Not palpable N/A  Femoral Palpable Palpable  Popliteal Not palpable Not palpable  PT Faintly palpable Not palpable  DP Not palpable Not palpable    Gastro- intestinal soft, non-distended, non-tender to palpation,   Musculo- skeletal M/S 5/5 throughout  , Extremities without ischemic changes  , Varicose veins noted bilateral, distal, medial shins; no venous ulcerations     Psychiatric Judgement intact, Mood & affect appropriate for pt's clinical situation  Dermatologic See M/S exam for extremity exam, No rashes otherwise noted  Lymphatic  Palpable lymph nodes: None    Non-invasive Vascular Imaging   BLE Venous Insufficiency Duplex   RLE:   Negative for DVT and SVT,   R GSV harvested   0.86mm SSV ,  Negative for deep venous reflux  LLE:  Negative for DVT and SVT,   L GSV 0.44mm maximal diameter,   0.38mm SSV reflux,  CFV, femoral, and popliteal vein with venous reflux   Medical Decision Making   [name] [name] is a 62 y.o. male who presents with: BLE varicose veins as well as LLE claudication   L GSV <32mm in maximal diameter; reflex noted in left common femoral vein, femoral vein and popliteal vein  Recommended daily compression and elevation  Symptoms and exam consistent with left calf claudication  Recheck bilateral lower extremity edema and varicose veins as well as ABIs in 3 months  Return sooner if claudication or edema worsens  Continue aspirin and statin regimen   Dagoberto Ligas, PA-C [facility] Office: [phone]  04/13/2017, 3:14 PM  History and exam findings as above.  Patient does have evidence of superficial reflux and deep vein reflux in the left lower extremity.  His vein is not large enough currently to consider laser ablation.  Mainstay of therapy is going to be compression therapy and I discussed compliance of this with the patient today.  He  will return in 3 months to see if his symptoms have  improved.  Fortunately he has no skin breakdown currently.  We will perform ABIs on him when he returns in 3 months since in the past he does have an element of peripheral arterial disease as well.  Patient was told he could elevate the legs and use extra strength Tylenol intermittently for inflammation and aching.  I told him he did not need narcotic pain medication for his current symptoms.  Ruta Hinds, MD [facility] Office: [phone] Pager: [phone]

## 2017-04-14 NOTE — Addendum Note (Signed)
Addended by: Lianne Cure A on: 04/14/2017 03:47 PM   Modules accepted: Orders

## 2017-04-21 ENCOUNTER — Encounter (INDEPENDENT_AMBULATORY_CARE_PROVIDER_SITE_OTHER): Payer: Self-pay | Admitting: Orthopaedic Surgery

## 2017-04-21 NOTE — Progress Notes (Signed)
Office Visit Note   Patient: [name] [name]           Date of Birth: [birth year]           MRN: [id number] Visit Date: 03/10/2017              Requested by: Briscoe Deutscher, [address] [place], [place] [address] PCP: Briscoe Deutscher, DO   Assessment & Plan: Visit Diagnoses:  1. Acute pain of right shoulder   2. Other spondylosis with radiculopathy, cervical region     Plan: We will proceed with cervical myelogram CT scan as previously planned 3 years ago.  His symptoms have progressed.  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 Follow-Up Instructions: Return if symptoms worsen or fail to improve.   Orders:  Orders Placed This Encounter  Procedures  . XR Shoulder Right  . DG Myelogram Cervical   No orders of the defined types were placed in this encounter.     Procedures: No procedures performed   Clinical Data: No additional findings.   Subjective: Chief Complaint  Patient presents with  . Left Knee - Pain  . Right Shoulder - Pain    HPI 62 year old male seen with right shoulder pain is been present for a few months.  Has some pain with outstretched reaching and overhead activity.  He denies chills or fever.  Cervical spondylosis mid cervical region.  He is not able to tolerate MRI scans even with medications.  2015 myelogram CT scan was ordered but was canceled by patient.  Previous electrical studies showed left carpal tunnel syndrome, left cubital tunnel and changes consistent with C7 greater than C6 radiculopathy on the left.  Review of Systems positive for coronary artery disease with bypass surgery.  Positive for diastolic heart failure, morbid obesity and some dependent diabetes, hyperlipidemia, hypertension, hepatic cirrhosis.  Former smoker.  Obstructive sleep apnea, ulnar neuritis left arm.  Lower  extremity venous stasis.  Otherwise negative as it pertains HPI.   Objective: Vital Signs: BP 128/78   Pulse 97   Ht 5\' 8"  (1.727 m)   Wt 285 lb (129.3 kg)   BMI 43.33 kg/m   Physical Exam  Constitutional: He is oriented to person, place, and time. He appears well-developed and well-nourished.  HENT:  Head: Normocephalic and atraumatic.  Eyes: EOM are normal. Pupils are equal, round, and reactive to light.  Neck: No tracheal deviation present. No thyromegaly present.  Cardiovascular: Normal rate.  Well healed median sternotomy incision.  Pulmonary/Chest: Effort normal. He has no wheezes.  Abdominal: Soft. Bowel sounds are normal.  Neurological: He is alert and oriented to person, place, and time.  Skin: Skin is warm and dry. Capillary refill takes less than 2 seconds.  Psychiatric: He has a normal mood and affect. His behavior is normal. Judgment and thought content normal.    Ortho Exam patient has significant  brachial plexus tenderness both right and left.  Upper extremity reflexes are 2+.  Some tenderness to palpation of the carpal canal.  He has some lower extremity edema without pretibial ulceration. Specialty Comments:  No specialty comments available.  Imaging: No results found.   PMFS History: Patient Active Problem List   Diagnosis Date Noted  . Varicose vein of leg 04/13/2017  . Atherosclerotic peripheral vascular disease with intermittent claudication ([place]) 04/13/2017  . Polyneuropathy associated  with underlying disease ([place]) 03/20/2017  . Primary osteoarthritis of both knees 01/21/2017  . Coronary artery disease involving native coronary artery of native heart without angina pectoris 10/29/2016  . Seasonal and perennial allergic rhinitis 07/17/2016  . Radicular pain in left arm 03/27/2016  . Ulnar neuritis, left 03/27/2016  . Osteoarthritis of spine with radiculopathy, cervical region 03/27/2016  . Diastolic CHF, chronic ([place]) 03/24/2016  . Edema 08/12/2010   . PVD (peripheral vascular disease) ([place]) 04/30/2009  . Morbid obesity ([place]) 12/26/2008  . 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 HLD (hyperlipidemia) 01/04/2007  . Acute MI ([place]) 01/04/2007  . Bronchitis 01/04/2007  . Hepatic cirrhosis ([place]) 01/04/2007  . Pancreatitis 01/04/2007  . Obstructive sleep apnea 01/04/2007  . HTN (hypertension) 01/04/2007   Past Medical History:  Diagnosis Date  . Acute myocardial infarction, unspecified site, episode of care unspecified 2005  . Acute pancreatitis   . CAD (coronary artery disease)    status post CABG in 2005 w LIMA to LAD, left radial to second circumflex marginal, saphenous vein graft to PDA, saphenous vein graft to lateral subbrach of ramus intermediate, and sequential saphenous vein graft to the medial subbranch of ramus intermediate  . Cirrhosis of liver without mention of alcohol   . Complications affecting other specified body systems, hypertension   . Hyperlipidemia   . Neuropathy   . Other and unspecified hyperlipidemia   . Proteinuria   . Sleep apnea   . Type II or unspecified type diabetes mellitus without mention of complication, not stated as uncontrolled     Family History  Problem Relation Age of Onset  . Breast cancer Mother   . Prostate cancer Father   . Coronary artery disease Father        underwent CABG    Past Surgical History:  Procedure Laterality Date  . CORONARY ARTERY BYPASS GRAFT    . ENT surgery     Social History   Occupational History  . Not on file  Tobacco Use  . Smoking status: Former Smoker    Last attempt to quit: 10/20/2004    Years since quitting: 12.5  . Smokeless tobacco: Never Used  Substance and Sexual Activity  . Alcohol use: Yes    Comment: occasional  . Drug use: No  . Sexual activity: Not on file

## 2017-04-27 ENCOUNTER — Ambulatory Visit (INDEPENDENT_AMBULATORY_CARE_PROVIDER_SITE_OTHER): Payer: Medicare Other | Admitting: Physician Assistant

## 2017-04-27 ENCOUNTER — Encounter: Payer: Self-pay | Admitting: Physician Assistant

## 2017-04-27 VITALS — BP 118/70 | HR 97 | Ht 68.0 in | Wt 292.6 lb

## 2017-04-27 DIAGNOSIS — I2510 Atherosclerotic heart disease of native coronary artery without angina pectoris: Secondary | ICD-10-CM

## 2017-04-27 DIAGNOSIS — R002 Palpitations: Secondary | ICD-10-CM

## 2017-04-27 DIAGNOSIS — I5032 Chronic diastolic (congestive) heart failure: Secondary | ICD-10-CM | POA: Diagnosis not present

## 2017-04-27 DIAGNOSIS — I4510 Unspecified right bundle-branch block: Secondary | ICD-10-CM | POA: Diagnosis not present

## 2017-04-27 DIAGNOSIS — I491 Atrial premature depolarization: Secondary | ICD-10-CM

## 2017-04-27 MED ORDER — METOPROLOL TARTRATE 50 MG PO TABS: 50.0000 mg | ORAL_TABLET | Freq: Two times a day (BID) | ORAL | 3 refills | Status: DC

## 2017-04-27 NOTE — Patient Instructions (Signed)
Medication Instructions:  Your physician has recommended you make the following change in your medication:  1.  INCREASE the Lopressor to twice a day  Labwork: TODAY:  BMET & MAGNESIUM  Testing/Procedures: Your physician has requested that you have an echocardiogram BEFORE 05/11/17. Echocardiography is a painless test that uses sound waves to create images of your heart. It provides your doctor with information about the size and shape of your heart and how well your heart's chambers and valves are working. This procedure takes approximately one hour. There are no restrictions for this procedure.   Follow-Up: Your physician recommends that you schedule a follow-up appointment in: 05/11/17 ARRIVE AT 11:15 TO SEE DAYNA DUNN, PA-C   Any Other Special Instructions Will Be Listed Below (If Applicable). Echocardiogram An echocardiogram, or echocardiography, uses sound waves (ultrasound) to produce an image of your heart. The echocardiogram is simple, painless, obtained within a short period of time, and offers valuable information to your health care provider. The images from an echocardiogram can provide information such as:  Evidence of coronary artery disease (CAD).  Heart size.  Heart muscle function.  Heart valve function.  Aneurysm detection.  Evidence of a past heart attack.  Fluid buildup around the heart.  Heart muscle thickening.  Assess heart valve function.  Tell a health care provider about:  Any allergies you have.  All medicines you are taking, including vitamins, herbs, eye drops, creams, and over-the-counter medicines.  Any problems you or family members have had with anesthetic medicines.  Any blood disorders you have.  Any surgeries you have had.  Any medical conditions you have.  Whether you are pregnant or may be pregnant. What happens before the procedure? No special preparation is needed. Eat and drink normally. What happens during the  procedure?  In order to produce an image of your heart, gel will be applied to your chest and a wand-like tool (transducer) will be moved over your chest. The gel will help transmit the sound waves from the transducer. The sound waves will harmlessly bounce off your heart to allow the heart images to be captured in real-time motion. These images will then be recorded.  You may need an IV to receive a medicine that improves the quality of the pictures. What happens after the procedure? You may return to your normal schedule including diet, activities, and medicines, unless your health care provider tells you otherwise. This information is not intended to replace advice given to you by your health care provider. Make sure you discuss any questions you have with your health care provider. Document Released: 01/25/2000 Document Revised: 09/15/2015 Document Reviewed: 10/04/2012 Elsevier Interactive Patient Education  2017 [employer].     If you need a refill on your cardiac medications before your next appointment, please call your pharmacy.

## 2017-04-27 NOTE — Progress Notes (Signed)
Cardiology Office Note    Date:  04/27/2017  ID:  [name] [name], DOB Jun 29, 1955, MRN [id number] PCP:  Briscoe Deutscher, DO  Cardiologist:  Dr. Percival Spanish   Chief Complaint: palpitations  History of Present Illness:  [name] [name] is a 62 y.o. male with history of CAD s/p CABG 2005, morbid obesity, chronic diastolic CHF (BNP normal 03/2295), essential HTN, RBBB, pancreatitis, cirrhosis, HLD, varicose veins, neuropathy, OSA (followed by Dr. Annamaria Boots) who presents for yearly follow-up. Overall he has done well since bypass with several nuclear stress tests since then, with last nuclear stress test in 2015 that was normal, non-gated. EF was 58% by nuc in 2013. Last echo in 2012 showed EF 50-55%, mildly dilated RV and mod-severely dilated RA. 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 His father prayed over his liver for 12 hours straight and he has been told it has been healed ever since then. He has not touched alcohol since. He has been followed by [facility] for varicose veins with recommendation for compression. ABIs are pending. He recently saw PCP 03/2017 with complaints of intermittent heart racing and chest tightness.  EKG appeared to show NSR with irregular rhythm, intermittent ectopic atrial beating and chronic RBBB. Last labs showed TSH wnl, Cr 1.01, K 3.0, albumin 3.6, AST/ALT OK, Hgb 14.9, plt 258, 03/2017; A1C 9.9 06/2016.  For the past few months he's noticed an intermittent sensation of heart racing about once a day, lasting up to 30 minutes at a time. It comes on without provocation regardless of what he is doing. It has happened when he is doing nothing at all and also with activity. He will sometimes check his HR and it will be up to 117. He sits down, focuses on his breathing and it eventually eases off. It sometimes makes him feel lightheaded when it goes fast. He has noticed some  indigestion after eating but has not had any indigestion or chest tightness with exertion. In fact he said he tends to feel better when physically active because it takes his mind off any symptoms he might have. Denies dyspnea. He feels perfectly fine today. EKG shows intermittent fluctuation from sinus into an ectopic atrial rhythm with a higher HR in the upper 90s. Denies Etoh, tobacco, drug use. He's not been entirely faithful with his potassium - took yesterday and the day before, but not today. He forgets most days. He reports compliance with CPAP.   Past Medical History:  Diagnosis Date  . Acute myocardial infarction, unspecified site, episode of care unspecified 2005  . Acute pancreatitis   . CAD (coronary artery disease)    a. CABG in 2005 w LIMA to LAD, left radial to second circumflex marginal, saphenous vein graft to PDA, saphenous vein graft to lateral subbrach of ramus intermediate, and sequential saphenous vein graft to the medial subbranch of ramus intermediate.  . Chronic diastolic CHF (congestive heart failure) ([place])   . Cirrhosis of liver without mention of alcohol   . Complications affecting other specified body systems, hypertension   . Essential hypertension   . Hyperlipidemia   . Morbid obesity ([place])   . Neuropathy   . Other and unspecified hyperlipidemia   . Proteinuria   . RBBB   . Sleep apnea   . Type II or unspecified type diabetes mellitus without mention of complication, not stated as uncontrolled   .  Varicose veins of both lower extremities     Past Surgical History:  Procedure Laterality Date  . CORONARY ARTERY BYPASS GRAFT    . ENT surgery      Current Medications: Current Meds  Medication Sig  . ACCU-CHEK AVIVA PLUS test strip   . albuterol (PROVENTIL HFA;VENTOLIN HFA) 108 (90 Base) MCG/ACT inhaler Inhale 2 puffs into the lungs every 6 (six) hours as needed for wheezing or shortness of breath.  Marland Kitchen albuterol (PROVENTIL) (2.5 MG/3ML) 0.083% nebulizer  solution Take 3 mLs (2.5 mg total) by nebulization every 6 (six) hours as needed for wheezing or shortness of breath.  Marland Kitchen aspirin EC 81 MG tablet Take 81 mg by mouth daily.   . B-D ULTRAFINE III SHORT PEN 31G X 8 MM MISC   . eplerenone (INSPRA) 25 MG tablet take 1 tablet by mouth once daily  . FLUoxetine (PROZAC) 20 MG capsule Take 1 capsule (20 mg total) by mouth every morning.  . fluticasone (FLONASE) 50 MCG/ACT nasal spray Place 1 spray into both nostrils daily.  Marland Kitchen guaiFENesin-codeine 100-10 MG/5ML syrup Take 5 mLs by mouth 3 (three) times daily as needed for cough.  Marland Kitchen HUMALOG KWIKPEN 200 UNIT/ML SOPN Inject 10-30 Units into the skin 3 (three) times daily with meals. 20 units with smaller meals 30 units with regular meals and 10 units with snacks. Sliding Scale  . ipratropium (ATROVENT) 0.02 % nebulizer solution Take 2.5 mLs (0.5 mg total) by nebulization every 6 (six) hours as needed for wheezing or shortness of breath.  Marland Kitchen ipratropium (ATROVENT) 0.06 % nasal spray Place 2 sprays into both nostrils 4 (four) times daily.  . Lancets (ACCU-CHEK MULTICLIX) lancets   . LANTUS SOLOSTAR 100 UNIT/ML Solostar Pen Inject 70 Units into the skin every morning.   Marland Kitchen LORazepam (ATIVAN) 0.5 MG tablet Take 1 tablet (0.5 mg total) by mouth 2 (two) times daily as needed for anxiety.  Marland Kitchen losartan (COZAAR) 50 MG tablet Take 1 tablet by mouth daily.  . metoprolol tartrate (LOPRESSOR) 50 MG tablet take 1 tablet by mouth once daily  . nitroGLYCERIN (NITROSTAT) 0.4 MG SL tablet Place 1 tablet (0.4 mg total) under the tongue every 5 (five) minutes as needed.  . nystatin cream (MYCOSTATIN) 1 application daily as needed.  . potassium chloride SA (K-DUR,KLOR-CON) 20 MEQ tablet Take 1 tablet (20 mEq total) by mouth daily.  Marland Kitchen pyridOXINE (VITAMIN B-6) 100 MG tablet Take daily to help with nerve health  . rosuvastatin (CRESTOR) 20 MG tablet Take 1 tablet (20 mg total) by mouth every evening.  . torsemide (DEMADEX) 20 MG tablet  take 1 tablet by mouth twice a day  . traMADol (ULTRAM) 50 MG tablet   . [DISCONTINUED] simvastatin (ZOCOR) 80 MG tablet Take 80 mg by mouth daily.      Allergies:   Ibuprofen   Social History   Socioeconomic History  . Marital status: Single    Spouse name: None  . Number of children: None  . Years of education: None  . Highest education level: None  Social Needs  . Financial resource strain: None  . Food insecurity - worry: None  . Food insecurity - inability: None  . Transportation needs - medical: None  . Transportation needs - non-medical: None  Occupational History  . None  Tobacco Use  . Smoking status: Former Smoker    Last attempt to quit: 10/20/2004    Years since quitting: 12.5  . Smokeless tobacco: Never Used  Substance and  Sexual Activity  . Alcohol use: Yes    Comment: occasional  . Drug use: No  . Sexual activity: None  Other Topics Concern  . None  Social History Narrative   Lives alone, apartment "friends smoke". Disability. Is disabled secondary to his work-related injury.      Family History:  Family History  Problem Relation Age of Onset  . Breast cancer Mother   . Prostate cancer Father   . Coronary artery disease Father        underwent CABG    ROS:   Please see the history of present illness. No bleeding. All other systems are reviewed and otherwise negative.    PHYSICAL EXAM:   VS:  BP 118/70 (BP Location: Left Arm, Patient Position: Sitting, Cuff Size: Large)   Pulse 97   Ht 5\' 8"  (1.727 m)   Wt 292 lb 9.6 oz (132.7 kg)   SpO2 93%   BMI 44.49 kg/m   BMI: Body mass index is 44.49 kg/m. GEN: Well nourished, well developed obese AAM, in no acute distress  HEENT: normocephalic, atraumatic Neck: no JVD, carotid bruits, or masses Cardiac: regularly irregular rhythm; no murmurs, rubs, or gallops, no edema -> compression hose in place Respiratory:  clear to auscultation bilaterally, normal work of breathing GI: soft, nontender,  nondistended, + BS MS: no deformity or atrophy  Skin: warm and dry, no rash Neuro:  Alert and Oriented x 3, Strength and sensation are intact, follows commands Psych: euthymic mood, full affect  Wt Readings from Last 3 Encounters:  04/27/17 292 lb 9.6 oz (132.7 kg)  04/13/17 295 lb (133.8 kg)  03/20/17 294 lb 6.4 oz (133.5 kg)      Studies/Labs Reviewed:   EKG:  EKG was ordered today and personally reviewed by me and demonstrates NSR with intermittent runs of low ectopic atrial rhythm with faster rate than native rhythm, RBBB with similar nonspecific ST-T changes as before (reviewed with Dr. Curt Bears)  Recent Labs: 03/20/2017: ALT 8; BUN 14; Creat 1.01; Hemoglobin 14.9; Platelets 258; Potassium 3.0; Sodium 138; TSH 1.71   Lipid Panel    Component Value Date/Time   CHOL 154 01/08/2010 0837   TRIG 166.0 (H) 01/08/2010 0837   HDL 40.90 01/08/2010 0837   CHOLHDL 4 01/08/2010 0837   VLDL 33.2 01/08/2010 0837   LDLCALC 80 01/08/2010 0837    Additional studies/ records that were reviewed today include: Summarized above.    ASSESSMENT & PLAN:   1. Palpitations with intermittent low ectopic atrial rhythm/borderline ectopic atrial tach - reviewed with Dr. Curt Bears in clinic today with the above interpretation. He does not feel this represents atrial flutter at this time. The P wave is inverted on his ectopic atrial rhythm. Per our discussion, will hold off on event monitor and go ahead and increase Lopressor to BID (he's not sure why he's only on this once a day). Recheck potassium along with magnesium. I anticipate his K will still be low. Recent TSH and Hgb were normal. Will also update echocardiogram. 2. CAD - his symptoms seem to be more suggestive of arrhythmia. F/u echo and symptoms.  Continue current regimen except escalate metoprolol to BID. If blood pressure becomes an issue, can cut back losartan. 3. Hyperlipidemia - continue statin. He clarified that he is on Crestor, not  Zocor. 4. Chronic diastolic CHF - appears euvolemic on exam. He reports significant improvement with compression as advised by [facility]. 5. RBBB - no bradycardia noted.  Disposition: F/u with myself  in ~2 weeks. Advised patient to call if no symptom improvement before then.   Medication Adjustments/Labs and Tests Ordered: Current medicines are reviewed at length with the patient today.  Concerns regarding medicines are outlined above. Medication changes, Labs and Tests ordered today are summarized above and listed in the Patient Instructions accessible in Encounters.   Signed, Charlie Pitter, PA-C  04/27/2017 3:29 PM    [facility] [address], [place], [place]  [address] Phone: [phone]; Fax: [phone]

## 2017-04-28 ENCOUNTER — Telehealth: Payer: Self-pay | Admitting: Physician Assistant

## 2017-04-28 DIAGNOSIS — Z79899 Other long term (current) drug therapy: Secondary | ICD-10-CM

## 2017-04-28 LAB — BASIC METABOLIC PANEL
BUN/Creatinine Ratio: 12 (ref 10–24)
BUN: 15 mg/dL (ref 8–27)
CO2: 29 mmol/L (ref 20–29)
CREATININE: 1.23 mg/dL (ref 0.76–1.27)
Calcium: 9.3 mg/dL (ref 8.6–10.2)
Chloride: 90 mmol/L — ABNORMAL LOW (ref 96–106)
GFR calc Af Amer: 72 mL/min/{1.73_m2} (ref >59)
GFR calc non Af Amer: 62 mL/min/{1.73_m2} (ref >59)
GLUCOSE: 234 mg/dL — AB (ref 65–99)
Potassium: 2.9 mmol/L — ABNORMAL LOW (ref 3.5–5.2)
SODIUM: 137 mmol/L (ref 134–144)

## 2017-04-28 LAB — MAGNESIUM: MAGNESIUM: 1.6 mg/dL (ref 1.6–2.3)

## 2017-04-28 MED ORDER — POTASSIUM CHLORIDE CRYS ER 20 MEQ PO TBCR: 40.0000 meq | EXTENDED_RELEASE_TABLET | Freq: Two times a day (BID) | ORAL | 3 refills | Status: DC

## 2017-04-28 MED ORDER — MAGNESIUM OXIDE -MG SUPPLEMENT 400 (240 MG) MG PO TABS: 400.0000 mg | ORAL_TABLET | Freq: Every day | ORAL | 1 refills | Status: DC

## 2017-04-28 NOTE — Telephone Encounter (Signed)
-----   Message from Charlie Pitter, [place] sent at 04/28/2017 12:00 PM EDT ----- Please let patient know potassium is extremely low. It has traditionally run quite low. Would recommend to increase potassium to 2 tablets BID. He was previously intermittently noncompliant with this- very important to take regularly. Magnesium is also low. Start MagOx 400mg  daily. Please increase dietary intake of healthy sources of potassium/magnesium including bananas, squash, yogurt, white beans, sweet potatoes, leafy greens, fish, whole grains, avocados.  For sure needs repeat BMET/Mg on Friday - please run as stat. Dayna Dunn PA-C

## 2017-04-28 NOTE — Telephone Encounter (Signed)
New Message ° ° °Patient is returning call in reference to labs. Please call to discuss.  °

## 2017-05-01 ENCOUNTER — Telehealth: Payer: Self-pay

## 2017-05-01 ENCOUNTER — Telehealth: Payer: Self-pay | Admitting: Cardiology

## 2017-05-01 ENCOUNTER — Other Ambulatory Visit: Payer: Medicare Other

## 2017-05-01 DIAGNOSIS — Z79899 Other long term (current) drug therapy: Secondary | ICD-10-CM

## 2017-05-01 DIAGNOSIS — E876 Hypokalemia: Secondary | ICD-10-CM

## 2017-05-01 LAB — BASIC METABOLIC PANEL
BUN/Creatinine Ratio: 11 (ref 10–24)
BUN: 14 mg/dL (ref 8–27)
CALCIUM: 8.6 mg/dL (ref 8.6–10.2)
CO2: 31 mmol/L — AB (ref 20–29)
Chloride: 94 mmol/L — ABNORMAL LOW (ref 96–106)
Creatinine, Ser: 1.28 mg/dL — ABNORMAL HIGH (ref 0.76–1.27)
GFR, EST AFRICAN AMERICAN: 68 mL/min/{1.73_m2} (ref >59)
GFR, EST NON AFRICAN AMERICAN: 59 mL/min/{1.73_m2} — AB (ref >59)
Glucose: 305 mg/dL — ABNORMAL HIGH (ref 65–99)
POTASSIUM: 2.9 mmol/L — AB (ref 3.5–5.2)
Sodium: 135 mmol/L (ref 134–144)

## 2017-05-01 LAB — MAGNESIUM: MAGNESIUM: 1.6 mg/dL (ref 1.6–2.3)

## 2017-05-01 NOTE — Telephone Encounter (Signed)
Patient stated he received a call regarding labs. I explained to patient that it may have been the automated recording for upcoming lab appt scheduled for Monday. Patient confirmed that appt and thankful for the call.

## 2017-05-01 NOTE — Telephone Encounter (Signed)
Received a critical lab value of potassium 2.9. I called patient he states he is taking kdur 20 meq (1 tablet) two times a day. Reviewed lab result with DOD (Dr. Caryl Comes), per Dr. Caryl Comes, INCREASE kdur to 40 meq 4 times a day for today and tomorrow CONTINUE with kdur 40 meq two times a day. Instructed patient to hold Demadex tomorrow and repeat BMET on Monday 05/04/17. Patient in agreement with treatment plan and verbalized understanding.

## 2017-05-01 NOTE — Telephone Encounter (Signed)
New Message:    Pt returning a call concerning his lab work

## 2017-05-01 NOTE — Telephone Encounter (Signed)
See result note.  

## 2017-05-04 ENCOUNTER — Other Ambulatory Visit: Payer: Medicare Other | Admitting: *Deleted

## 2017-05-04 ENCOUNTER — Telehealth: Payer: Self-pay | Admitting: Physician Assistant

## 2017-05-04 DIAGNOSIS — E876 Hypokalemia: Secondary | ICD-10-CM

## 2017-05-04 LAB — BASIC METABOLIC PANEL
BUN/Creatinine Ratio: 10 (ref 10–24)
BUN: 11 mg/dL (ref 8–27)
CALCIUM: 8.7 mg/dL (ref 8.6–10.2)
CO2: 22 mmol/L (ref 20–29)
Chloride: 100 mmol/L (ref 96–106)
Creatinine, Ser: 1.1 mg/dL (ref 0.76–1.27)
GFR calc Af Amer: 82 mL/min/{1.73_m2} (ref >59)
GFR, EST NON AFRICAN AMERICAN: 71 mL/min/{1.73_m2} (ref >59)
Glucose: 181 mg/dL — ABNORMAL HIGH (ref 65–99)
POTASSIUM: 3.2 mmol/L — AB (ref 3.5–5.2)
Sodium: 136 mmol/L (ref 134–144)

## 2017-05-04 NOTE — Telephone Encounter (Signed)
New message    Patient calling for lab results and discuss Potassium adjustment.

## 2017-05-04 NOTE — Telephone Encounter (Signed)
Informed patient that results have been forwarded to [facility], [facility] to review and explained that he would get a call with additional recommendations if needed. Patient verbalized understanding and thankful for the call.

## 2017-05-05 ENCOUNTER — Telehealth: Payer: Self-pay | Admitting: *Deleted

## 2017-05-05 DIAGNOSIS — Z79899 Other long term (current) drug therapy: Secondary | ICD-10-CM

## 2017-05-05 NOTE — Telephone Encounter (Signed)
-----   Message from Charlie Pitter, [place] sent at 05/05/2017 12:47 PM EDT ----- Please get a BMET that day, await echo. Do not necessarily need to add appt Thurs unless new or worsening sx. Please make sure pt understands med directions as I am not convinced he is taking his potassium and magnesium how he should be. Please clarify exactly what dose/Mg of what he is currently taking.  As per Dr. Aquilla Hacker' recommendation it appears he wants him to take 1 torsemide a day and potassium chloride 2 tablets 3 times a day. He also should be taking Magnesium Oxide twice a day. If he had not started this yet, please begin taking once a day.  (There has been some confusion as the result note from 4 days ago came to my inbox and I acted on it, but Triage had gotten a call in the meantime and took it to DOD). Dayna Dunn PA-C

## 2017-05-07 ENCOUNTER — Other Ambulatory Visit: Payer: Self-pay

## 2017-05-07 ENCOUNTER — Ambulatory Visit (HOSPITAL_COMMUNITY): Payer: Medicare Other | Attending: Cardiology

## 2017-05-07 ENCOUNTER — Other Ambulatory Visit: Payer: Medicare Other

## 2017-05-07 DIAGNOSIS — I340 Nonrheumatic mitral (valve) insufficiency: Secondary | ICD-10-CM | POA: Insufficient documentation

## 2017-05-07 DIAGNOSIS — Z79899 Other long term (current) drug therapy: Secondary | ICD-10-CM

## 2017-05-07 DIAGNOSIS — I10 Essential (primary) hypertension: Secondary | ICD-10-CM | POA: Diagnosis not present

## 2017-05-07 DIAGNOSIS — I491 Atrial premature depolarization: Secondary | ICD-10-CM | POA: Diagnosis not present

## 2017-05-07 DIAGNOSIS — E119 Type 2 diabetes mellitus without complications: Secondary | ICD-10-CM | POA: Insufficient documentation

## 2017-05-07 DIAGNOSIS — I2510 Atherosclerotic heart disease of native coronary artery without angina pectoris: Secondary | ICD-10-CM | POA: Diagnosis not present

## 2017-05-07 MED ORDER — PERFLUTREN LIPID MICROSPHERE
1.0000 mL | INTRAVENOUS | Status: AC | PRN
  Administered 2017-05-07: 2 mL via INTRAVENOUS

## 2017-05-08 ENCOUNTER — Telehealth: Payer: Self-pay | Admitting: Physician Assistant

## 2017-05-08 LAB — BASIC METABOLIC PANEL
BUN/Creatinine Ratio: 9 — ABNORMAL LOW (ref 10–24)
BUN: 10 mg/dL (ref 8–27)
CALCIUM: 9 mg/dL (ref 8.6–10.2)
CHLORIDE: 103 mmol/L (ref 96–106)
CO2: 23 mmol/L (ref 20–29)
Creatinine, Ser: 1.17 mg/dL (ref 0.76–1.27)
GFR calc Af Amer: 76 mL/min/{1.73_m2} (ref >59)
GFR calc non Af Amer: 66 mL/min/{1.73_m2} (ref >59)
Glucose: 173 mg/dL — ABNORMAL HIGH (ref 65–99)
POTASSIUM: 3.8 mmol/L (ref 3.5–5.2)
Sodium: 140 mmol/L (ref 134–144)

## 2017-05-08 NOTE — Telephone Encounter (Signed)
-----   Message from Charlie Pitter, [place] sent at 05/07/2017  3:08 PM EDT ----- Please let patient know echo overall reassuring, EF is low-normal. There is mild leaking of mitral valve. Right ventricle is mildly dilated which may be related to history of sleep apnea. Keep f/u as planned.  I also went back and reviewed the result note from 3/25. I did not get the reply to by inbox, but to reiterate, the potassium dose should be 2 tablets three times a day, not 1 tablet three times a day (see my instructions on that phone note).  Dayna Dunn PA-C

## 2017-05-08 NOTE — Telephone Encounter (Signed)
New Message    Patient is returning call in reference to labs.

## 2017-05-08 NOTE — Telephone Encounter (Signed)
Returned pts call and discussed echo/lab results. See result note.

## 2017-05-10 ENCOUNTER — Encounter: Payer: Self-pay | Admitting: Physician Assistant

## 2017-05-10 DIAGNOSIS — E876 Hypokalemia: Secondary | ICD-10-CM | POA: Insufficient documentation

## 2017-05-10 DIAGNOSIS — I491 Atrial premature depolarization: Secondary | ICD-10-CM | POA: Insufficient documentation

## 2017-05-10 NOTE — Progress Notes (Signed)
Cardiology Office Note    Date:  05/11/2017  ID:  [name] [name], DOB 07-25-1955, MRN [id number] PCP:  Briscoe Deutscher, DO  Cardiologist:  Dr. Percival Spanish   Chief Complaint: f/u palpitations  History of Present Illness:  [name] [name] is a 62 y.o. male with history of CAD s/p CABG 2005, morbid obesity, chronic diastolic CHF (BNP normal 07/5033), hypokalemia (intermittently noncompliant with supplementation) essential HTN, RBBB, pancreatitis, cirrhosis, HLD, varicose veins, neuropathy, OSA (followed by Dr. Annamaria Boots) who presents for yearly follow-up. Overall he has done well since bypass with several nuclear stress tests since then, with last nuclear stress test in 2015 that was normal, non-gated. 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 He states his father prayed over his liver for 12 hours straight and he has been told it has been healed ever since then. He has not touched alcohol since. He has been followed by [facility] for varicose veins with recommendation for compression. ABIs are pending.   He recently saw PCP 03/2017 with complaints of intermittent heart racing and chest tightness. EKG appeared to show NSR with irregular rhythm, intermittent ectopic atrial beating, and chronic RBBB. (The computer interpretation was Afib/flutter but per review with cardiology MD, not felt to be the case.) He was seen in cardiology clinic 04/27/17 at which time rhythm strips continued to show runs of what appeared to be an low ectopic atrial rhythm (just shy of tachycardic) upon review by Dr. Curt Bears. He reported compliance with CPAP. Lopressor was increased to BID as he had only been taking this once daily. He was found to have marked hypokalemia down to 2.9 in the setting of noncompliance with supplement. Mg was 1.6. Although I'd acted on the result it was simultaneously taken to the DOD as well given the  critical result who advised he decrease torsemide to once daily and increase potassium to TID. I also had recommended to add MagOx 400mg  daily but the patient did not do so yet. F/u labs 05/07/17 showed K 3.8, Cr 1.17; otherwise recent TSH wnl, albumin 3.6, AST/ALT OK, Hgb 14.9, plt 258, 03/2017; A1C 9.9 06/2016. 2D echo 05/07/17 showed EF 46-56%, normal diastolic parameters, mild MR, mildly dilated RV, moderately dilated RA, poor acoustic windows.  The patient returns for follow-up overall feeling much better. He's not had any further chest tightness or racing heart. He "tested himself" this past weekend by raking in the yard and doing yardwork and felt great. He has chronic mild dyspnea which is unchanged. Edema has slightly increased since reduction in torsemide.    Past Medical History:  Diagnosis Date  . Acute myocardial infarction, unspecified site, episode of care unspecified 2005  . Acute pancreatitis   . CAD (coronary artery disease)    a. CABG in 2005 w LIMA to LAD, left radial to second circumflex marginal, saphenous vein graft to PDA, saphenous vein graft to lateral subbrach of ramus intermediate, and sequential saphenous vein graft to the medial subbranch of ramus intermediate.  . Chronic diastolic CHF (congestive heart failure) ([place])   . Cirrhosis of liver without mention of alcohol   . Complications affecting other specified body systems, hypertension   . Ectopic atrial rhythm   . Essential hypertension   . Hyperlipidemia   . Hypokalemia    a. intermitttent noncompliance with potassium supplement.  . Hypomagnesemia   . Morbid obesity ([place])   .  Neuropathy   . Other and unspecified hyperlipidemia   . Proteinuria   . RBBB   . Sleep apnea   . Type II or unspecified type diabetes mellitus without mention of complication, not stated as uncontrolled   . Varicose veins of both lower extremities     Past Surgical History:  Procedure Laterality Date  . CORONARY ARTERY BYPASS GRAFT      . ENT surgery      Current Medications: Current Meds  Medication Sig  . ACCU-CHEK AVIVA PLUS test strip   . albuterol (PROVENTIL HFA;VENTOLIN HFA) 108 (90 Base) MCG/ACT inhaler Inhale 2 puffs into the lungs every 6 (six) hours as needed for wheezing or shortness of breath.  Marland Kitchen albuterol (PROVENTIL) (2.5 MG/3ML) 0.083% nebulizer solution Take 3 mLs (2.5 mg total) by nebulization every 6 (six) hours as needed for wheezing or shortness of breath.  Marland Kitchen aspirin EC 81 MG tablet Take 81 mg by mouth daily.   . B-D ULTRAFINE III SHORT PEN 31G X 8 MM MISC   . eplerenone (INSPRA) 25 MG tablet take 1 tablet by mouth once daily  . FLUoxetine (PROZAC) 20 MG capsule Take 1 capsule (20 mg total) by mouth every morning.  . fluticasone (FLONASE) 50 MCG/ACT nasal spray Place 1 spray into both nostrils daily.  Marland Kitchen guaiFENesin-codeine 100-10 MG/5ML syrup Take 5 mLs by mouth 3 (three) times daily as needed for cough.  Marland Kitchen HUMALOG KWIKPEN 200 UNIT/ML SOPN Inject 10-30 Units into the skin 3 (three) times daily with meals. 20 units with smaller meals 30 units with regular meals and 10 units with snacks. Sliding Scale  . ipratropium (ATROVENT) 0.02 % nebulizer solution Take 2.5 mLs (0.5 mg total) by nebulization every 6 (six) hours as needed for wheezing or shortness of breath.  Marland Kitchen ipratropium (ATROVENT) 0.06 % nasal spray Place 2 sprays into both nostrils 4 (four) times daily.  . Lancets (ACCU-CHEK MULTICLIX) lancets   . LANTUS SOLOSTAR 100 UNIT/ML Solostar Pen Inject 70 Units into the skin every morning.   Marland Kitchen LORazepam (ATIVAN) 0.5 MG tablet Take 1 tablet (0.5 mg total) by mouth 2 (two) times daily as needed for anxiety.  Marland Kitchen losartan (COZAAR) 50 MG tablet Take 1 tablet by mouth daily.  . Magnesium Oxide 400 (240 Mg) MG TABS Take 1 tablet (400 mg total) by mouth daily.  . metoprolol tartrate (LOPRESSOR) 50 MG tablet Take 1 tablet (50 mg total) by mouth 2 (two) times daily.  . nitroGLYCERIN (NITROSTAT) 0.4 MG SL tablet Place  1 tablet (0.4 mg total) under the tongue every 5 (five) minutes as needed.  . nystatin cream (MYCOSTATIN) Apply 1 application topically daily as needed for dry skin.   Marland Kitchen pyridoxine (B-6) 100 MG tablet Take 100 mg by mouth daily. To help with nerve health  . rosuvastatin (CRESTOR) 20 MG tablet Take 1 tablet (20 mg total) by mouth every evening.  . torsemide (DEMADEX) 20 MG tablet take 1 tablet by mouth twice a day (Taking once a day)  . traMADol (ULTRAM) 50 MG tablet Take 50 mg by mouth every 12 (twelve) hours.   . [DISCONTINUED] potassium chloride SA (K-DUR,KLOR-CON) 20 MEQ tablet Take 66meq three times daily   Allergies:   Ibuprofen   Social History   Socioeconomic History  . Marital status: Single    Spouse name: Not on file  . Number of children: Not on file  . Years of education: Not on file  . Highest education level: Not on file  Occupational History  . Not on file  Social Needs  . Financial resource strain: Not on file  . Food insecurity:    Worry: Not on file    Inability: Not on file  . Transportation needs:    Medical: Not on file    Non-medical: Not on file  Tobacco Use  . Smoking status: Former Smoker    Last attempt to quit: 10/20/2004    Years since quitting: 12.5  . Smokeless tobacco: Never Used  Substance and Sexual Activity  . Alcohol use: Yes    Comment: occasional  . Drug use: No  . Sexual activity: Not on file  Lifestyle  . Physical activity:    Days per week: Not on file    Minutes per session: Not on file  . Stress: Not on file  Relationships  . Social connections:    Talks on phone: Not on file    Gets together: Not on file    Attends religious service: Not on file    Active member of club or organization: Not on file    Attends meetings of clubs or organizations: Not on file    Relationship status: Not on file  Other Topics Concern  . Not on file  Social History Narrative   Lives alone, apartment "friends smoke". Disability. Is disabled  secondary to his work-related injury.      Family History:  Family History  Problem Relation Age of Onset  . Breast cancer Mother   . Prostate cancer Father   . Coronary artery disease Father        underwent CABG     ROS:   Please see the history of present illness.  All other systems are reviewed and otherwise negative.    PHYSICAL EXAM:   VS:  BP 115/65   Pulse 71   Ht 5\' 9"  (1.753 m)   Wt (!) 302 lb (137 kg)   BMI 44.60 kg/m   BMI: Body mass index is 44.6 kg/m. GEN: Well nourished, well developed morbidly obese AAM, in no acute distress  HEENT: normocephalic, atraumatic Neck: no JVD, carotid bruits, or masses Cardiac: Irregular rhythm, controlled rate, no murmurs, rubs, or gallops, trace-1+ BLE edema (difficult to ascertain edema versus body tissue given his obesity), compression hose in place Respiratory:  clear to auscultation bilaterally, normal work of breathing GI: soft, nontender, nondistended, + BS MS: no deformity or atrophy  Skin: warm and dry, no rash Neuro:  Alert and Oriented x 3, Strength and sensation are intact, follows commands Psych: euthymic mood, full affect  Wt Readings from Last 3 Encounters:  05/11/17 (!) 302 lb (137 kg)  04/27/17 292 lb 9.6 oz (132.7 kg)  04/13/17 295 lb (133.8 kg)      Studies/Labs Reviewed:   EKG:  EKG was ordered today and personally reviewed by me and demonstrates NSR 71bpm with occasional ectopic atrial beating, RBBB, LVH. TWI I, avL, V4-V6 which has intermittently been noted  Recent Labs: 03/20/2017: ALT 8; Hemoglobin 14.9; Platelets 258; TSH 1.71 05/01/2017: Magnesium 1.6 05/07/2017: BUN 10; Creatinine, Ser 1.17; Potassium 3.8; Sodium 140   Lipid Panel    Component Value Date/Time   CHOL 154 01/08/2010 0837   TRIG 166.0 (H) 01/08/2010 0837   HDL 40.90 01/08/2010 0837   CHOLHDL 4 01/08/2010 0837   VLDL 33.2 01/08/2010 0837   LDLCALC 80 01/08/2010 0837    Additional studies/ records that were reviewed today  include: Summarized above.    ASSESSMENT &  PLAN:   1. Low ectopic atrial rhythm - reviewed strips again with Dr. Curt Bears in clinic today (including strip I uncovered from 2017 felt to represent an ectopic atrial tach). The patient still has occasional ectopic atrial rhythm on rhythm strip but HR is much improved and now in the 70s. He is now asymptomatic without further chest pain or SOB. Continue present regimen. We did discuss risk of worsening arrhythmias if he does not get a handle on his weight. 2. Chronic diastolic CHF - he has gained some lower extremity edema back after torsemide was reduced. Will titrate torsemide back to prior dose of 20mg  BID as he seemed to be stable on this for quite some time. Reviewed 2g sodium restriction, 2L fluid restriction with patient. He also is likely gaining body weight as well as he continues to drink excessive sodas. We discussed importance of cutting down sugar and fluid. Initial BP by the tech was 98 systolic but I think this likely had something to do with the irregularity of his rhythm, and difficulty auscultating the true first reappearance of heart sounds. Personal recheck by me was 115/65. If his BP begins to run on the softer side, we can decrease his losartan. 3. Hypokalemia - recently improved after patient became compliant with his potassium supplement. See above regarding adjustment of torsemide back to prior dose - will titrate KCl to 15meq TID. He assures he can comply with this. He is also on epleronone so will recheck BMET/Mg in 1 week. 4. CAD - he recently tested the waters with exertion and felt fine doing so. Continue surveillance, ASA, BB, statin.  Disposition: The pt wishes to establish care in our [facility] office as he is closer. I've requested he follow-up here in 3 months with next available physician. Will send a message to Dr. Percival Spanish making sure he is OK with this switch.   Medication Adjustments/Labs and Tests  Ordered: Current medicines are reviewed at length with the patient today.  Concerns regarding medicines are outlined above. Medication changes, Labs and Tests ordered today are summarized above and listed in the Patient Instructions accessible in Encounters.   Signed, [name] Pitter, PA-C  05/11/2017 12:44 PM    [facility] [address], [place], [place]  [address] Phone: [phone]; Fax: [phone]

## 2017-05-11 ENCOUNTER — Other Ambulatory Visit: Payer: Medicare Other

## 2017-05-11 ENCOUNTER — Encounter: Payer: Self-pay | Admitting: Physician Assistant

## 2017-05-11 ENCOUNTER — Ambulatory Visit (INDEPENDENT_AMBULATORY_CARE_PROVIDER_SITE_OTHER): Payer: Medicare Other | Admitting: Physician Assistant

## 2017-05-11 ENCOUNTER — Telehealth: Payer: Self-pay | Admitting: Cardiology

## 2017-05-11 ENCOUNTER — Telehealth: Payer: Self-pay | Admitting: Physician Assistant

## 2017-05-11 VITALS — BP 115/65 | HR 71 | Ht 69.0 in | Wt 302.0 lb

## 2017-05-11 DIAGNOSIS — I491 Atrial premature depolarization: Secondary | ICD-10-CM | POA: Diagnosis not present

## 2017-05-11 DIAGNOSIS — I5032 Chronic diastolic (congestive) heart failure: Secondary | ICD-10-CM | POA: Diagnosis not present

## 2017-05-11 DIAGNOSIS — I2510 Atherosclerotic heart disease of native coronary artery without angina pectoris: Secondary | ICD-10-CM

## 2017-05-11 DIAGNOSIS — E876 Hypokalemia: Secondary | ICD-10-CM | POA: Diagnosis not present

## 2017-05-11 MED ORDER — POTASSIUM CHLORIDE CRYS ER 20 MEQ PO TBCR: 40.0000 meq | EXTENDED_RELEASE_TABLET | Freq: Three times a day (TID) | ORAL | 11 refills | Status: DC

## 2017-05-11 MED ORDER — TORSEMIDE 20 MG PO TABS: 20.0000 mg | ORAL_TABLET | Freq: Two times a day (BID) | ORAL | 10 refills | Status: DC

## 2017-05-11 NOTE — Telephone Encounter (Signed)
Left message for patient to call back  

## 2017-05-11 NOTE — Telephone Encounter (Signed)
Please call the patient. I wanted to make sure he knows to check his blood pressure periodically since we are increasing his torsemide. If he notices it's running on the low side (I.e. Less than 225 systolic) and/or he's having dizziness with standing or weakness, he should hold his losartan and notify our office.  Dayna Dunn PA-C

## 2017-05-11 NOTE — Telephone Encounter (Signed)
Pt called back.  Instructed to monitor BP periodically and report BP's under 256 systolic and/or any dizziness with standing or weakness.  Advised if this happens to hold losartan and call office.

## 2017-05-11 NOTE — Patient Instructions (Signed)
Your physician has recommended you make the following change in your medication:  1.) START the magnesium oxide ASAP 2.) increase torsemide to 20 mg twice a day 3.) increase potassium to 20 meq--take 2 tablets (40 meq) three times a        Day  Your physician recommends that you return for lab work in: about 1 week (BMET, [place])  Your physician recommends that you schedule a follow-up appointment in: 3 months with [facility] cardiologist--needs assigned new physician--previously with [facility] office.

## 2017-05-12 ENCOUNTER — Other Ambulatory Visit: Payer: Self-pay | Admitting: *Deleted

## 2017-05-12 DIAGNOSIS — I10 Essential (primary) hypertension: Secondary | ICD-10-CM

## 2017-05-12 MED ORDER — FUROSEMIDE 20 MG PO TABS: 20.0000 mg | ORAL_TABLET | Freq: Two times a day (BID) | ORAL | 3 refills | Status: DC

## 2017-05-12 MED ORDER — LOSARTAN POTASSIUM 50 MG PO TABS
25.0000 mg | ORAL_TABLET | Freq: Every day | ORAL | 0 refills | Status: DC
Start: 2017-05-12 — End: 2018-03-03

## 2017-05-12 MED ORDER — MAGNESIUM OXIDE 400 MG PO CAPS: 400.0000 mg | ORAL_CAPSULE | Freq: Every day | ORAL | 0 refills | Status: DC

## 2017-05-19 ENCOUNTER — Telehealth: Payer: Self-pay | Admitting: Physician Assistant

## 2017-05-19 NOTE — Telephone Encounter (Signed)
Patient aware and verbalized understanding. °

## 2017-05-19 NOTE — Telephone Encounter (Signed)
Called pt re: appt with Dr. Saunders Revel to establish with new cardiologist, transferring from [place] to [facility] st. Left a message for pt call back.  Appt 07/23/17 with Dr. Saunders Revel @ 2:00

## 2017-05-19 NOTE — Telephone Encounter (Signed)
Returned call to patient who states he is having issues with constipation, has not had BM since Thursday.  Denies distention, bloating, abd pain.   States he feels nauseous.   No vomiting.   Patient was wondering if this could be caused by the change/increase in medications.  Patient states he is has started taking Miralax and stool softeners.   Patient also reports he decreased his torsemide to 20mg  daily as his swelling has improved (increased to BID at OV with Melina Copa PA on 4/1.   Advised patient to hydrate (2L restriction and sodium restriction) with water, try to mobilize.   Patient is aware he has repeat labs this week as well.    Routed to PA for review/further recommendations.

## 2017-05-19 NOTE — Telephone Encounter (Signed)
New message     Pt c/o medication issue:  1. Name of Medication: potassium chloride SA (K-DUR,KLOR-CON) 20 MEQ tablet Take 2 tablets (40 mEq total) by mouth 3 (three) times daily.         2. How are you currently taking this medication (dosage and times per day)?  2 tab 3x a day   3. Are you having a reaction (difficulty breathing--STAT)?  yes  4. What is your medication issue?  Patient is having problem with severe constipation has not how bowel movement since Thursday

## 2017-05-19 NOTE — Telephone Encounter (Signed)
See below - Dr. Percival Spanish gave permission to switch cardiologists to [facility]. At my recent OV, recommended 3 month f/u to establish with next available MD. Please facilitate arranging (I don't see this was scheduled - might have been pending Dr. Rosezella [place] review). Kaydyn Sayas PA-C   ===View-only below this line===  ----- Message ----- From: Minus Breeding, MD Sent: 05/11/2017   9:07 PM To: Charlie Pitter, PA-C Subject: RE: Patient switch                             Yes ----- Message ----- From: Charlie Pitter, PA-C Sent: 05/11/2017   1:15 PM To: Minus Breeding, MD, Charlie Pitter, PA-C Subject: Patient switch                                 I saw this patient of yours in clinic the last 2 times. [facility] office is much closer to his house. He'd like to follow up here. Is that OK with you? Bralynn Donado

## 2017-05-19 NOTE — Telephone Encounter (Signed)
Covering for [place] - Reviewed most recent progress note and telephone encounter from today. Agree with reducing Torsemide to 20 mg daily if edema has improved.  Agree with using Miralax and stool softeners as these are safe to use with his current medication regimen. Encourage water intake (previously consuming a large number of sodas by review of notes). Should follow-up with PCP if symptoms persist.   Thanks, [place]

## 2017-05-21 ENCOUNTER — Other Ambulatory Visit: Payer: Medicare Other

## 2017-05-22 ENCOUNTER — Other Ambulatory Visit: Payer: Medicare Other

## 2017-05-22 DIAGNOSIS — E876 Hypokalemia: Secondary | ICD-10-CM

## 2017-05-22 DIAGNOSIS — I491 Atrial premature depolarization: Secondary | ICD-10-CM

## 2017-05-22 DIAGNOSIS — I2510 Atherosclerotic heart disease of native coronary artery without angina pectoris: Secondary | ICD-10-CM

## 2017-05-22 DIAGNOSIS — I5032 Chronic diastolic (congestive) heart failure: Secondary | ICD-10-CM

## 2017-05-23 LAB — BASIC METABOLIC PANEL
BUN / CREAT RATIO: 8 — AB (ref 10–24)
BUN: 11 mg/dL (ref 8–27)
CALCIUM: 9.1 mg/dL (ref 8.6–10.2)
CO2: 23 mmol/L (ref 20–29)
Chloride: 103 mmol/L (ref 96–106)
Creatinine, Ser: 1.37 mg/dL — ABNORMAL HIGH (ref 0.76–1.27)
GFR, EST AFRICAN AMERICAN: 63 mL/min/{1.73_m2} (ref >59)
GFR, EST NON AFRICAN AMERICAN: 55 mL/min/{1.73_m2} — AB (ref >59)
Glucose: 98 mg/dL (ref 65–99)
Potassium: 3.9 mmol/L (ref 3.5–5.2)
Sodium: 140 mmol/L (ref 134–144)

## 2017-05-23 LAB — MAGNESIUM: Magnesium: 1.8 mg/dL (ref 1.6–2.3)

## 2017-05-26 ENCOUNTER — Telehealth: Payer: Self-pay | Admitting: Family Medicine

## 2017-05-26 ENCOUNTER — Telehealth: Payer: Self-pay

## 2017-05-26 DIAGNOSIS — I10 Essential (primary) hypertension: Secondary | ICD-10-CM

## 2017-05-26 NOTE — Telephone Encounter (Signed)
Patient was seen by cardiology and was told that the potassium that he is taking so much potassium that he is becoming constipated. Was told to call our office to see if you would give him something to help with it. He is taking miralax daily with a stool softer. He is having bowel movement every day but it is hard. He normally has twice a day.

## 2017-05-26 NOTE — Telephone Encounter (Signed)
Copied from [place] [phone]. Topic: Quick Communication - See Telephone Encounter >> May 26, 2017 12:29 PM Arletha Grippe wrote: CRM for notification. See Telephone encounter for: 05/26/17.pt would like to have a call back from assistant or provider.  He says it is about his potassium level.  Please call back to [phone]

## 2017-05-26 NOTE — Telephone Encounter (Signed)
-----   Message from [facility], [place] sent at 05/25/2017  6:22 AM EDT ----- K and Mg levels are normal but slight bump in SCr (measure of kidney function), after increasing diuretic dose. Recommend rechecking SCr again in 1 week to insure no further increase in level.

## 2017-05-26 NOTE — Telephone Encounter (Signed)
See note

## 2017-05-26 NOTE — Telephone Encounter (Signed)
Informed patient of results and verbal understanding expressed.  Repeat BMET scheduled this Friday. Patient agrees with treatment plan.

## 2017-05-27 NOTE — Telephone Encounter (Signed)
Patient informed will call if any questions.  

## 2017-05-27 NOTE — Telephone Encounter (Signed)
Potassium causes diarrhea. He may increase Miralax to BID. Increase fiber.

## 2017-05-29 ENCOUNTER — Other Ambulatory Visit: Payer: Medicare Other | Admitting: *Deleted

## 2017-05-29 ENCOUNTER — Ambulatory Visit: Payer: Medicare Other | Admitting: Internal Medicine

## 2017-05-29 DIAGNOSIS — I10 Essential (primary) hypertension: Secondary | ICD-10-CM

## 2017-05-30 LAB — BASIC METABOLIC PANEL
BUN/Creatinine Ratio: 9 — ABNORMAL LOW (ref 10–24)
BUN: 12 mg/dL (ref 8–27)
CHLORIDE: 100 mmol/L (ref 96–106)
CO2: 22 mmol/L (ref 20–29)
Calcium: 9.3 mg/dL (ref 8.6–10.2)
Creatinine, Ser: 1.32 mg/dL — ABNORMAL HIGH (ref 0.76–1.27)
GFR calc Af Amer: 66 mL/min/{1.73_m2} (ref >59)
GFR calc non Af Amer: 57 mL/min/{1.73_m2} — ABNORMAL LOW (ref >59)
GLUCOSE: 124 mg/dL — AB (ref 65–99)
Potassium: 3.9 mmol/L (ref 3.5–5.2)
SODIUM: 139 mmol/L (ref 134–144)

## 2017-06-02 ENCOUNTER — Other Ambulatory Visit: Payer: Self-pay | Admitting: Family Medicine

## 2017-06-02 ENCOUNTER — Telehealth: Payer: Self-pay | Admitting: Physician Assistant

## 2017-06-02 DIAGNOSIS — I25708 Atherosclerosis of coronary artery bypass graft(s), unspecified, with other forms of angina pectoris: Secondary | ICD-10-CM

## 2017-06-02 NOTE — Progress Notes (Signed)
Pt has been made aware of normal result and verbalized understanding.  jw 06/02/17

## 2017-06-02 NOTE — Telephone Encounter (Signed)
Returned pts call and went over lab results.  See result note.

## 2017-06-02 NOTE — Telephone Encounter (Signed)
-----   Message from Consuelo Pandy, [place] sent at 06/02/2017  4:23 PM EDT ----- Kidney function remains stable. No further increase in SCr. This is good. Can continue on medications. No changes.

## 2017-06-02 NOTE — Telephone Encounter (Signed)
New message ° ° ° °Patient calling for lab results °

## 2017-06-09 ENCOUNTER — Ambulatory Visit (INDEPENDENT_AMBULATORY_CARE_PROVIDER_SITE_OTHER): Payer: Medicare Other | Admitting: Orthopaedic Surgery

## 2017-06-13 ENCOUNTER — Other Ambulatory Visit: Payer: Self-pay | Admitting: Physician Assistant

## 2017-06-13 ENCOUNTER — Telehealth: Payer: Self-pay | Admitting: Physician Assistant

## 2017-06-13 DIAGNOSIS — I5032 Chronic diastolic (congestive) heart failure: Secondary | ICD-10-CM

## 2017-06-13 MED ORDER — FUROSEMIDE 20 MG PO TABS: 20.0000 mg | ORAL_TABLET | Freq: Two times a day (BID) | ORAL | 3 refills | Status: DC

## 2017-06-13 NOTE — Telephone Encounter (Signed)
Pt called stating his pharmacy was out of lasix. He has not had lasix in 2 days. He has gained 5 lbs. He is not short of breath and is wearing compression stockings. I advised him to take three 30 mg tablets for three days with extra potassium for weight gain. I advised him to call back if he continued to gain weight. He expressed understanding of the plan.

## 2017-07-13 ENCOUNTER — Ambulatory Visit: Payer: Medicare Other | Admitting: Internal Medicine

## 2017-07-22 ENCOUNTER — Telehealth: Payer: Self-pay | Admitting: Internal Medicine

## 2017-07-22 NOTE — Telephone Encounter (Signed)
Spoke with patient who was concerned about whether or not he needed to come in for lab check.  I informed him to take his medication as prescribed and we will contact him if labs need to be drawn.  He verbalized understanding.

## 2017-07-22 NOTE — Telephone Encounter (Signed)
New message   Patient calling to request order for labs

## 2017-07-23 ENCOUNTER — Encounter: Payer: Self-pay | Admitting: Family Medicine

## 2017-07-23 ENCOUNTER — Institutional Professional Consult (permissible substitution): Payer: Medicare Other | Admitting: Internal Medicine

## 2017-07-23 ENCOUNTER — Encounter (HOSPITAL_COMMUNITY): Payer: Medicare Other

## 2017-07-23 ENCOUNTER — Ambulatory Visit (INDEPENDENT_AMBULATORY_CARE_PROVIDER_SITE_OTHER): Payer: Medicare Other | Admitting: Family Medicine

## 2017-07-23 ENCOUNTER — Ambulatory Visit: Payer: Medicare Other | Admitting: Vascular Surgery

## 2017-07-23 VITALS — BP 122/82 | HR 68 | Temp 97.8°F | Ht 69.0 in | Wt 293.6 lb

## 2017-07-23 DIAGNOSIS — M1712 Unilateral primary osteoarthritis, left knee: Secondary | ICD-10-CM | POA: Diagnosis not present

## 2017-07-23 MED ORDER — DICLOFENAC SODIUM 1 % TD GEL: 2.0000 g | Freq: Four times a day (QID) | TRANSDERMAL | 1 refills | Status: DC

## 2017-07-23 NOTE — Patient Instructions (Signed)
Try tumeric over the counter. Make sure has black pepper for better absorption, but all the vitamin will have this.   Put the voltaren gel on the knee up to 4x/day  Sending to ortho to see if candidate for rooster injections as you are not a surgical candidate.   Dr. Juleen China will need to decide if she would be willing to give you tramadol, but I think we need to see if ortho can offer any other treatment before we start chronic opioid pain control.
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
## 2017-08-04 ENCOUNTER — Ambulatory Visit (INDEPENDENT_AMBULATORY_CARE_PROVIDER_SITE_OTHER): Payer: Medicare Other | Admitting: Orthopaedic Surgery

## 2017-08-10 ENCOUNTER — Other Ambulatory Visit: Payer: Self-pay | Admitting: Family Medicine

## 2017-08-10 DIAGNOSIS — I25708 Atherosclerosis of coronary artery bypass graft(s), unspecified, with other forms of angina pectoris: Secondary | ICD-10-CM

## 2017-08-17 ENCOUNTER — Encounter: Payer: Self-pay | Admitting: Internal Medicine

## 2017-08-17 ENCOUNTER — Ambulatory Visit (INDEPENDENT_AMBULATORY_CARE_PROVIDER_SITE_OTHER): Payer: Medicare Other | Admitting: Internal Medicine

## 2017-08-17 VITALS — BP 130/70 | HR 95 | Ht 69.0 in | Wt 297.0 lb

## 2017-08-17 DIAGNOSIS — I5032 Chronic diastolic (congestive) heart failure: Secondary | ICD-10-CM | POA: Diagnosis not present

## 2017-08-17 DIAGNOSIS — G4733 Obstructive sleep apnea (adult) (pediatric): Secondary | ICD-10-CM | POA: Diagnosis not present

## 2017-08-17 NOTE — Patient Instructions (Signed)
Order- DME [facility]- reorder CPAP auto 5-20, mask of choice, humidifier, supplies, AirView  Dx OSA  Please call as needed

## 2017-08-17 NOTE — Progress Notes (Signed)
HPI  male former smoker followed for OSA, chronic bronchitis, SAR, CAD/MI/CABG/ CHF, HBP, DM, cirrhosis liver, Continues oxygen 2 L for sleep/ [facility]. PFT 06/18/2011-moderate restriction. Flows are normal for measure volume with FEV1/FVC 0.84 and insignificant response to bronchodilator. DLCO 52%. HST-08/05/2016-AHI 37.5/hour, desaturation to 67%, body weight 294 pounds ---------------------------------------------------------------------------------- 07/17/16- 62 year old male former smoker followed for OSA, chronic bronchitis, SAR, CAD/MI/CABG, DM sleep study patient wants a sleep study to get back on a CPAP machine  O2 2 L sleep/[facility] Failed CPAP after CABG 2005 because pressure hurt his sternum. He admits loud snoring and daytime sleepiness. His PCP has asked that he get rechecked. Occasional wheezing-uses nebulizer 3 times per week. Asks Flonase refill for rhinitis.  08/17/2017- 62 year old male former smoker followed for OSA, chronic bronchitis, SAR, CAD/MI/CABG, dCHF, DM2, hepatic cirrhosis, HST-08/05/2016-AHI 37.5/hour, desaturation to 67%, body weight 294 pounds O2-not using-[facility] -----FOLLOWS FOR: He has not been using CPAP due to non compliance. His DME made him turn his machine in. Neb Atrovent// albuterol, albuterol HFA We had ordered CPAP but he says he got sick, could not use CPAP and DME took it back for noncompliance.  He says he was very sick for a while with CHF, influenza and electrolyte imbalance problems.  No longer using his oxygen during sleep. He is willing to try CPAP again, understanding it would help his heart.  ROS-see HPI   + = positive Constitutional:   No-   weight loss, night sweats, fevers, chills, + fatigue, lassitude. HEENT:   No-  headaches, difficulty swallowing, tooth/dental problems, sore throat,       No-  sneezing, itching, ear ache, nasal congestion, post nasal drip,  CV:  No-   chest pain, orthopnea, PND, + swelling in lower extremities, anasarca,   dizziness, palpitations Resp: + Shortness of breath with exertion or at rest.              No-   productive cough,  No non-productive cough,  No- coughing up of blood.              No-   change in color of mucus.  + Occasional wheezing.   Skin: No-   rash or lesions. GI:  No-   heartburn, indigestion, abdominal pain, nausea, vomiting,  GU: . MS:  No-   joint pain or swelling.   Neuro-     nothing unusual Psych:  No- change in mood or affect. No depression or anxiety.  No memory loss.   OBJ- Physical Exam General- Alert, Oriented, Affect-appropriate, Distress- none acute, obese,  Skin- rash-none, lesions- none, excoriation- none. + Increased skin turgor? Edema Lymphadenopathy- none Head- atraumatic            Eyes- Gross vision intact, PERRLA, conjunctivae and secretions clear            Ears- Hearing, canals-normal            Nose- Clear, no-Septal dev, mucus, polyps, erosion, perforation             Throat- Mallampati IV , mucosa clear , drainage- none, tonsils- atrophic, +missing teeth Neck- flexible , trachea midline, no stridor , thyroid nl, carotid no bruit Chest - symmetrical excursion , unlabored           Heart/CV- RRR , no murmur , no gallop  , no rub, nl s1 s2                           -  JVD- none , edema- none, stasis changes- none, varices- none           Lung- clear to P&A, wheeze- none, cough- none , dullness-none, rub- none           Chest wall- sternotomy scar Abd-  Br/ Gen/ Rectal- Not done, not indicated Extrem- cyanosis- none, clubbing, none, atrophy- none, strength- nl. Vascular graft donor site scar left forearm. + Cane Neuro- grossly intact to observation

## 2017-08-18 ENCOUNTER — Telehealth: Payer: Self-pay | Admitting: Internal Medicine

## 2017-08-18 DIAGNOSIS — G4733 Obstructive sleep apnea (adult) (pediatric): Secondary | ICD-10-CM

## 2017-08-18 NOTE — Telephone Encounter (Signed)
Ok to explain that to him, and schedule a CPAP titration for dx OSA

## 2017-08-18 NOTE — Assessment & Plan Note (Signed)
He is not in overt right heart failure at this visit, but we may need to update PFT to sort out cardiac vs pulmonary dyspnea.

## 2017-08-18 NOTE — Assessment & Plan Note (Addendum)
He dropped off of CPAP use and machine was taken back.  He says he is motivated now to stick with it and wants to try again.  Oxygen saturation got low during his home sleep test.  We will need to see if that corrects on CPAP and he understands we may need to schedule a CPAP titration study at the sleep center.  For now he will not try to attach oxygen to his new set up until we see how he does.  I think it is important that we keep it simple for him. Plan-reorder CPAP auto 5-20.  Careful reeducation discussion done.

## 2017-08-18 NOTE — Telephone Encounter (Signed)
Returned call to [place].  Patient has been non compliant with his CPAP machine, so it has been picked up.  For the Patient to get a another CPAP machine, he will have to have a CPAP titration done.  Dr. Annamaria Boots, please advise

## 2017-08-19 NOTE — Telephone Encounter (Signed)
Spoke with patient. He stated that [facility] picked up his CPAP machine about 3 months ago. He is willing to proceed with the cpap titration. Advised him that I would place the order today and someone would call him soon to get this scheduled. He verbalized understanding. Nothing else needed at time of call.

## 2017-08-25 ENCOUNTER — Ambulatory Visit (INDEPENDENT_AMBULATORY_CARE_PROVIDER_SITE_OTHER): Payer: Medicare Other | Admitting: Orthopaedic Surgery

## 2017-08-27 ENCOUNTER — Ambulatory Visit (INDEPENDENT_AMBULATORY_CARE_PROVIDER_SITE_OTHER): Payer: Medicare Other | Admitting: Internal Medicine

## 2017-08-27 ENCOUNTER — Encounter: Payer: Self-pay | Admitting: Internal Medicine

## 2017-08-27 VITALS — BP 126/72 | HR 81 | Ht 69.0 in | Wt 299.0 lb

## 2017-08-27 DIAGNOSIS — I2510 Atherosclerotic heart disease of native coronary artery without angina pectoris: Secondary | ICD-10-CM

## 2017-08-27 DIAGNOSIS — I739 Peripheral vascular disease, unspecified: Secondary | ICD-10-CM

## 2017-08-27 DIAGNOSIS — I5032 Chronic diastolic (congestive) heart failure: Secondary | ICD-10-CM

## 2017-08-27 DIAGNOSIS — I491 Atrial premature depolarization: Secondary | ICD-10-CM

## 2017-08-27 NOTE — Patient Instructions (Addendum)
Medication Instructions:  Your physician recommends that you continue on your current medications as directed. Please refer to the Current Medication list given to you today.  -- If you need a refill on your cardiac medications before your next appointment, please call your pharmacy. --  Labwork: BMET/MAGNESIUM  Testing/Procedures: None ordered  Follow-Up: Your physician wants you to follow-up in: 3 mionths with Dayna Dunn     Thank you for choosing [facility]!!    Any Other Special Instructions Will Be Listed Below (If Applicable).

## 2017-08-27 NOTE — Progress Notes (Signed)
Follow-up Outpatient Visit Date: 08/27/2017  Primary Care Provider: Briscoe Deutscher, [place] [address]  Chief Complaint: Follow-up coronary artery disease and chronic diastolic heart failure  HPI:  [name] [name] is a 62 y.o. year-old male with history of coronary artery disease status post CABG (2005), ectopic atrial rhythm, chronic HFpEF, hypokalemia, essential hypertension, pancreatitis, cirrhosis, hyperlipidemia, varicose veins, neuropathy, obstructive sleep apnea, and morbid obesity, who presents for follow-up of coronary artery disease and chronic diastolic heart failure.  He was last seen in our office by Melina Copa, PA, in April.  At that time, advised to increase torsemide back to 20 mg twice daily.    Today, [name] [name] reports that he has been feeling better with less edema and shortness of breath.  He also denies having orthopnea and PND.  Since taking his potassium supplementation more regularly, he also has fewer palpitations previously attributed to an ectopic atrial rhythm.  He denies chest pain and lightheadedness.  He has stable exertional dyspnea when grass, which is unchanged since last year.  He also has intermittent leg pains with activity, which are being followed by Dr. Oneida Alar (vascular surgery).  [name] [name] reports that he is currently undergoing sleep evaluation by Dr. Annamaria Boots and suspects that he will need to wear a CPAP machine.  He reports that he has not been compliant with his diet, as he continues to eat quite a bit of salt and also consumes multiple sugary sodas a day.  --------------------------------------------------------------------------------------------------  Past Medical History:  Diagnosis Date  . Acute myocardial infarction, unspecified site, episode of care unspecified 2005  . Acute pancreatitis   . CAD (coronary artery disease)    a. CABG in 2005 w LIMA to LAD, left radial to second circumflex marginal, saphenous vein graft to  PDA, saphenous vein graft to lateral subbrach of ramus intermediate, and sequential saphenous vein graft to the medial subbranch of ramus intermediate.  . Chronic diastolic CHF (congestive heart failure) ([place])   . Cirrhosis of liver without mention of alcohol   . Complications affecting other specified body systems, hypertension   . Ectopic atrial rhythm   . Essential hypertension   . Hyperlipidemia   . Hypokalemia    a. intermitttent noncompliance with potassium supplement.  . Hypomagnesemia   . Morbid obesity ([place])   . Neuropathy   . Other and unspecified hyperlipidemia   . Proteinuria   . RBBB   . Sleep apnea   . Type II or unspecified type diabetes mellitus without mention of complication, not stated as uncontrolled   . Varicose veins of both lower extremities    Past Surgical History:  Procedure Laterality Date  . CORONARY ARTERY BYPASS GRAFT    . ENT surgery      Current Meds  Medication Sig  . ACCU-CHEK AVIVA PLUS test strip   . albuterol (PROVENTIL HFA;VENTOLIN HFA) 108 (90 Base) MCG/ACT inhaler Inhale 2 puffs into the lungs every 6 (six) hours as needed for wheezing or shortness of breath.  Marland Kitchen albuterol (PROVENTIL) (2.5 MG/3ML) 0.083% nebulizer solution Take 3 mLs (2.5 mg total) by nebulization every 6 (six) hours as needed for wheezing or shortness of breath.  Marland Kitchen aspirin EC 81 MG tablet Take 81 mg by mouth daily.   . B-D ULTRAFINE III SHORT PEN 31G X 8 MM MISC   . diclofenac sodium (VOLTAREN) 1 % GEL Apply 2 g topically 4 (four) times daily. To your knee  . eplerenone (INSPRA) 25 MG tablet  take 1 tablet by mouth once daily  . fluticasone (FLONASE) 50 MCG/ACT nasal spray Place 1 spray into both nostrils daily.  . furosemide (LASIX) 20 MG tablet Take 1 tablet (20 mg total) by mouth 2 (two) times daily.  Marland Kitchen guaiFENesin-codeine 100-10 MG/5ML syrup Take 5 mLs by mouth 3 (three) times daily as needed for cough.  Marland Kitchen HUMALOG KWIKPEN 200 UNIT/ML SOPN Inject 10-30 Units into the skin  3 (three) times daily with meals. 20 units with smaller meals 30 units with regular meals and 10 units with snacks. Sliding Scale  . ipratropium (ATROVENT) 0.02 % nebulizer solution Take 2.5 mLs (0.5 mg total) by nebulization every 6 (six) hours as needed for wheezing or shortness of breath.  Marland Kitchen ipratropium (ATROVENT) 0.06 % nasal spray Place 2 sprays into both nostrils 4 (four) times daily.  . Lancets (ACCU-CHEK MULTICLIX) lancets   . LANTUS SOLOSTAR 100 UNIT/ML Solostar Pen Inject 70 Units into the skin every morning.   Marland Kitchen losartan (COZAAR) 50 MG tablet Take 0.5 tablets (25 mg total) by mouth daily.  . Magnesium Oxide 400 MG CAPS Take 1 capsule (400 mg total) by mouth daily.  . nitroGLYCERIN (NITROSTAT) 0.4 MG SL tablet PLACE 1 TABLET UNDER THE TONGUE EVERY 5 MINUTES IF NEEDED  . potassium chloride SA (K-DUR,KLOR-CON) 20 MEQ tablet Take 2 tablets (40 mEq total) by mouth 3 (three) times daily.  . rosuvastatin (CRESTOR) 20 MG tablet Take 1 tablet (20 mg total) by mouth every evening.  . torsemide (DEMADEX) 20 MG tablet Take 1 tablet (20 mg total) by mouth 2 (two) times daily.    Allergies: Ibuprofen  Social History   Tobacco Use  . Smoking status: Former Smoker    Packs/day: 1.50    Years: 30.00    Pack years: 45.00    Last attempt to quit: 10/20/2004    Years since quitting: 12.8  . Smokeless tobacco: Never Used  Substance Use Topics  . Alcohol use: Yes    Comment: occasional  . Drug use: No    Family History  Problem Relation Age of Onset  . Breast cancer Mother   . Prostate cancer Father   . Coronary artery disease Father        underwent CABG    Review of Systems: A 12-system review of systems was performed and was negative except as noted in the HPI.  --------------------------------------------------------------------------------------------------  Physical Exam: BP 126/72   Pulse 81   Ht 5\' 9"  (1.753 m)   Wt 299 lb (135.6 kg)   BMI 44.15 kg/m   General:  Morbidly obese man, seated comfortably in the exam room. HEENT: No conjunctival pallor or scleral icterus. Moist mucous membranes.  OP clear. Neck: Supple without lymphadenopathy, thyromegaly, JVD, or HJR, though evaluation is quite limited by body habitus. Lungs: Normal work of breathing. Clear to auscultation bilaterally without wheezes or crackles. Heart: Distant heart sounds.  Regular rate and rhythm without murmurs, rubs, or gallops.  Unable to assess PMI due to body habitus. Abd: Bowel sounds present. Soft, NT/ND.  Unable to assess HSM due to body habitus. Ext: Trace pretibial edema bilaterally. Skin: Warm and dry without rash.  EKG: Normal sinus rhythm and intermittent ectopic atrial rhythm with left axis deviation, LVH, nonspecific ST/T changes, and mild QT prolongation.  Lab Results  Component Value Date   WBC 6.8 03/20/2017   HGB 14.9 03/20/2017   HCT 41.1 03/20/2017   MCV 89.3 03/20/2017   PLT 258 03/20/2017  Lab Results  Component Value Date   NA 137 08/27/2017   K 3.8 08/27/2017   CL 102 08/27/2017   CO2 24 08/27/2017   BUN 11 08/27/2017   CREATININE 1.12 08/27/2017   GLUCOSE 115 (H) 08/27/2017   ALT 8 (L) 03/20/2017    Lab Results  Component Value Date   CHOL 154 01/08/2010   HDL 40.90 01/08/2010   LDLCALC 80 01/08/2010   TRIG 166.0 (H) 01/08/2010   CHOLHDL 4 01/08/2010    --------------------------------------------------------------------------------------------------  ASSESSMENT AND PLAN: Coronary artery disease No symptoms to suggest worsening coronary insufficiency.  Continue current medications for secondary prevention.  Chronic HFpEF Overall, [name] [name] seems stable to improved from this spring.  He still has trace edema on exam.  I think he should continue taking torsemide 20 mg twice daily as well as his current potassium supplementation.  I will recheck a potassium and magnesium level today given current ectopic atrial rhythm.  Ectopic  atrial rhythm Again noted today, albeit minimally symptomatic.  I will check potassium and magnesium levels, given that electrolyte deficiencies in the past have aggravated this.  Peripheral vascular disease Stable claudication.  Continue follow-up with vascular surgery.  Morbid obesity BMI greater than 40 with multiple comorbidities.  We discussed the importance of weight loss through exercise as well as dietary changes.  In particular, I have asked [name] [name] to limit his salt and sugar intake.  Follow-up: Return to clinic with Melina Copa, PA, in 3 months.  Given my transition to [place], I will have [name] [name] continue long-term follow-up with Dr. Marlou Porch, as he wishes to remain at the [facility] office.  [name], MD 08/29/2017 1:55 PM

## 2017-08-28 ENCOUNTER — Telehealth: Payer: Self-pay

## 2017-08-28 LAB — MAGNESIUM: MAGNESIUM: 2.1 mg/dL (ref 1.6–2.3)

## 2017-08-28 LAB — BASIC METABOLIC PANEL
BUN / CREAT RATIO: 10 (ref 10–24)
BUN: 11 mg/dL (ref 8–27)
CALCIUM: 9.1 mg/dL (ref 8.6–10.2)
CHLORIDE: 102 mmol/L (ref 96–106)
CO2: 24 mmol/L (ref 20–29)
Creatinine, Ser: 1.12 mg/dL (ref 0.76–1.27)
GFR calc non Af Amer: 70 mL/min/{1.73_m2} (ref >59)
GFR, EST AFRICAN AMERICAN: 80 mL/min/{1.73_m2} (ref >59)
GLUCOSE: 115 mg/dL — AB (ref 65–99)
POTASSIUM: 3.8 mmol/L (ref 3.5–5.2)
Sodium: 137 mmol/L (ref 134–144)

## 2017-08-28 NOTE — Telephone Encounter (Signed)
Notes recorded by Frederik Schmidt, RN on 08/28/2017 at 9:51 AM EDT Informed patient of lab results/recommendations. He verbalized understanding. ------

## 2017-08-28 NOTE — Telephone Encounter (Signed)
-----   Message from [name], MD sent at 08/28/2017  9:34 AM EDT ----- Please let [name] know that his potassium and magnesium levels are normal, as is his kidney function.  He should continue his current medications and follow-up as planned in about 3 months.

## 2017-08-29 ENCOUNTER — Encounter: Payer: Self-pay | Admitting: Internal Medicine

## 2017-08-29 DIAGNOSIS — I5032 Chronic diastolic (congestive) heart failure: Secondary | ICD-10-CM | POA: Insufficient documentation

## 2017-09-05 ENCOUNTER — Other Ambulatory Visit: Payer: Self-pay | Admitting: Family Medicine

## 2017-09-05 DIAGNOSIS — I25708 Atherosclerosis of coronary artery bypass graft(s), unspecified, with other forms of angina pectoris: Secondary | ICD-10-CM

## 2017-09-10 ENCOUNTER — Encounter (HOSPITAL_COMMUNITY): Payer: Medicare Other

## 2017-09-10 ENCOUNTER — Ambulatory Visit: Payer: Medicare Other | Admitting: Vascular Surgery

## 2017-09-18 ENCOUNTER — Encounter (HOSPITAL_BASED_OUTPATIENT_CLINIC_OR_DEPARTMENT_OTHER): Payer: Medicare Other

## 2017-09-22 ENCOUNTER — Ambulatory Visit (INDEPENDENT_AMBULATORY_CARE_PROVIDER_SITE_OTHER): Payer: Medicare Other | Admitting: Orthopaedic Surgery

## 2017-10-04 ENCOUNTER — Other Ambulatory Visit: Payer: Self-pay | Admitting: Family Medicine

## 2017-10-04 DIAGNOSIS — I25708 Atherosclerosis of coronary artery bypass graft(s), unspecified, with other forms of angina pectoris: Secondary | ICD-10-CM

## 2017-10-23 ENCOUNTER — Encounter (HOSPITAL_BASED_OUTPATIENT_CLINIC_OR_DEPARTMENT_OTHER): Payer: Medicare Other

## 2017-11-05 ENCOUNTER — Other Ambulatory Visit: Payer: Self-pay | Admitting: Family Medicine

## 2017-11-05 DIAGNOSIS — I25708 Atherosclerosis of coronary artery bypass graft(s), unspecified, with other forms of angina pectoris: Secondary | ICD-10-CM

## 2017-11-23 ENCOUNTER — Ambulatory Visit: Payer: Medicare Other | Admitting: Internal Medicine

## 2017-11-26 ENCOUNTER — Ambulatory Visit: Payer: Medicare Other | Admitting: Internal Medicine

## 2017-11-28 ENCOUNTER — Other Ambulatory Visit: Payer: Self-pay | Admitting: Family Medicine

## 2017-11-28 DIAGNOSIS — I25708 Atherosclerosis of coronary artery bypass graft(s), unspecified, with other forms of angina pectoris: Secondary | ICD-10-CM

## 2017-12-01 ENCOUNTER — Other Ambulatory Visit: Payer: Self-pay | Admitting: Physician Assistant

## 2017-12-03 ENCOUNTER — Ambulatory Visit: Payer: Medicare Other | Admitting: Internal Medicine

## 2017-12-08 ENCOUNTER — Other Ambulatory Visit: Payer: Self-pay | Admitting: Cardiology

## 2017-12-08 NOTE — Telephone Encounter (Signed)
Please refill this medication under Dr. Percival Spanish, primary cardiologist, because Dr. Saunders Revel is in the [place] office now. Thanks

## 2017-12-08 NOTE — Telephone Encounter (Signed)
Dr. Percival Spanish is the primary cardiologist. Please address

## 2018-02-09 ENCOUNTER — Ambulatory Visit: Payer: Medicare Other | Admitting: Physician Assistant

## 2018-02-11 ENCOUNTER — Telehealth: Payer: Self-pay | Admitting: Internal Medicine

## 2018-02-11 NOTE — Telephone Encounter (Signed)
Spoke with pt. States that he is not feeling well. Pt has been scheduled to see Beth tomorrow at 4:15pm per his request. Nothing further was needed at this time.

## 2018-02-12 ENCOUNTER — Ambulatory Visit (INDEPENDENT_AMBULATORY_CARE_PROVIDER_SITE_OTHER): Payer: Medicare Other | Admitting: Primary Care

## 2018-02-12 ENCOUNTER — Encounter: Payer: Self-pay | Admitting: Primary Care

## 2018-02-12 DIAGNOSIS — J40 Bronchitis, not specified as acute or chronic: Secondary | ICD-10-CM

## 2018-02-12 MED ORDER — HYDROCODONE-HOMATROPINE 5-1.5 MG/5ML PO SYRP: 5.0000 mL | ORAL_SOLUTION | Freq: Four times a day (QID) | ORAL | 0 refills | Status: DC | PRN

## 2018-02-12 MED ORDER — AZITHROMYCIN 250 MG PO TABS: ORAL_TABLET | ORAL | 0 refills | Status: DC

## 2018-02-12 NOTE — Patient Instructions (Addendum)
   Zpack as prescribed  Hycodan cough syrup every 6 hours as needed for cough - do not drive, combine with other sedation medication  Mucinex twice day (get this over the counter)  Use nebulizer every 4-6 hours for sob/wheezing       Acute Bronchitis, Adult Acute bronchitis is when air tubes (bronchi) in the lungs suddenly get swollen. The condition can make it hard to breathe. It can also cause these symptoms:  A cough.  Coughing up clear, yellow, or green mucus.  Wheezing.  Chest congestion.  Shortness of breath.  A fever.  Body aches.  Chills.  A sore throat. Follow these instructions at home:  Medicines  Take over-the-counter and prescription medicines only as told by your doctor.  If you were prescribed an antibiotic medicine, take it as told by your doctor. Do not stop taking the antibiotic even if you start to feel better. General instructions  Rest.  Drink enough fluids to keep your pee (urine) pale yellow.  Avoid smoking and secondhand smoke. If you smoke and you need help quitting, ask your doctor. Quitting will help your lungs heal faster.  Use an inhaler, cool mist vaporizer, or humidifier as told by your doctor.  Keep all follow-up visits as told by your doctor. This is important. How is this prevented? To lower your risk of getting this condition again:  Wash your hands often with soap and water. If you cannot use soap and water, use hand sanitizer.  Avoid contact with people who have cold symptoms.  Try not to touch your hands to your mouth, nose, or eyes.  Make sure to get the flu shot every year. Contact a doctor if:  Your symptoms do not get better in 2 weeks. Get help right away if:  You cough up blood.  You have chest pain.  You have very bad shortness of breath.  You become dehydrated.  You faint (pass out) or keep feeling like you are going to pass out.  You keep throwing up (vomiting).  You have a very bad  headache.  Your fever or chills gets worse. This information is not intended to replace advice given to you by your health care provider. Make sure you discuss any questions you have with your health care provider. Document Released: 07/16/2007 Document Revised: 09/10/2016 Document Reviewed: 07/18/2015 Elsevier Interactive Patient Education  2019 [employer].

## 2018-02-12 NOTE — Progress Notes (Signed)
@Patient  ID: [name] [name], male    DOB: 10-29-1955, 63 y.o.   MRN: [id number]  Chief Complaint  Patient presents with  . Acute Visit    cough with yellow mucus, SOB with exertion, chest tightness and wheezing x1 week    Referring provider: Briscoe Deutscher, DO  HPI: 63 year old male, former smoker quit 2006 (45 pack year hx). PMH significant for OSA, bronchitis, seasonal allergies. Patient of Dr. Annamaria Boots, last seen on 08/17/17.   02/12/2018 Patient presents today with acute complaints of productive cough with yellow mucus x 1 week. Feels that he is breathing hard. Hasn't used nebulizer. Afebrile.    Allergies  Allergen Reactions  . Ibuprofen Other (See Comments)    Stomach pain    Immunization History  Administered Date(s) Administered  . Influenza Split 11/11/2010, 10/23/2011, 11/10/2012  . Influenza,inj,Quad PF,6+ Mos 11/10/2013, 12/10/2016  . Influenza-Unspecified 11/30/2015  . Pneumococcal Polysaccharide-23 06/26/2008    Past Medical History:  Diagnosis Date  . Acute myocardial infarction, unspecified site, episode of care unspecified 2005  . Acute pancreatitis   . CAD (coronary artery disease)    a. CABG in 2005 w LIMA to LAD, left radial to second circumflex marginal, saphenous vein graft to PDA, saphenous vein graft to lateral subbrach of ramus intermediate, and sequential saphenous vein graft to the medial subbranch of ramus intermediate.  . Chronic diastolic CHF (congestive heart failure) ([place])   . Cirrhosis of liver without mention of alcohol   . Complications affecting other specified body systems, hypertension   . Ectopic atrial rhythm   . Essential hypertension   . Hyperlipidemia   . Hypokalemia    a. intermitttent noncompliance with potassium supplement.  . Hypomagnesemia   . Morbid obesity ([place])   . Neuropathy   . Other and unspecified hyperlipidemia   . Proteinuria   . RBBB   . Sleep apnea   . Type II or unspecified type diabetes mellitus without  mention of complication, not stated as uncontrolled   . Varicose veins of both lower extremities     Tobacco History: Social History   Tobacco Use  Smoking Status Former Smoker  . Packs/day: 1.50  . Years: 30.00  . Pack years: 45.00  . Last attempt to quit: 10/20/2004  . Years since quitting: 13.3  Smokeless Tobacco Never Used   Counseling given: Not Answered   Outpatient Medications Prior to Visit  Medication Sig Dispense Refill  . ACCU-CHEK AVIVA PLUS test strip     . albuterol (PROVENTIL HFA;VENTOLIN HFA) 108 (90 Base) MCG/ACT inhaler Inhale 2 puffs into the lungs every 6 (six) hours as needed for wheezing or shortness of breath. 1 Inhaler 12  . albuterol (PROVENTIL) (2.5 MG/3ML) 0.083% nebulizer solution Take 3 mLs (2.5 mg total) by nebulization every 6 (six) hours as needed for wheezing or shortness of breath. 75 mL prn  . aspirin EC 81 MG tablet Take 81 mg by mouth daily.     . B-D ULTRAFINE III SHORT PEN 31G X 8 MM MISC   1  . diclofenac sodium (VOLTAREN) 1 % GEL Apply 2 g topically 4 (four) times daily. To your knee 100 g 1  . eplerenone (INSPRA) 25 MG tablet TAKE 1 TABLET BY MOUTH ONCE DAILY 30 tablet 8  . fluticasone (FLONASE) 50 MCG/ACT nasal spray Place 1 spray into both nostrils daily. 16 g 12  . HUMALOG KWIKPEN 200 UNIT/ML SOPN Inject 10-30 Units into the skin 3 (three) times daily with meals.  20 units with smaller meals 30 units with regular meals and 10 units with snacks. Sliding Scale  0  . ipratropium (ATROVENT) 0.02 % nebulizer solution Take 2.5 mLs (0.5 mg total) by nebulization every 6 (six) hours as needed for wheezing or shortness of breath. 75 mL prn  . ipratropium (ATROVENT) 0.06 % nasal spray Place 2 sprays into both nostrils 4 (four) times daily. 15 mL 0  . Lancets (ACCU-CHEK MULTICLIX) lancets     . LANTUS SOLOSTAR 100 UNIT/ML Solostar Pen Inject 70 Units into the skin every morning.   0  . losartan (COZAAR) 50 MG tablet Take 0.5 tablets (25 mg total) by  mouth daily. 30 tablet 0  . Magnesium Oxide 400 (240 Mg) MG TABS TAKE 1 TABLET(400 MG) BY MOUTH DAILY 90 tablet 0  . Magnesium Oxide 400 MG CAPS Take 1 capsule (400 mg total) by mouth daily. 30 capsule 0  . nitroGLYCERIN (NITROSTAT) 0.4 MG SL tablet PLACE 1 TABLET UNDER THE TONGUE EVERY 5 MINUTES IF NEEDED 25 tablet 0  . potassium chloride SA (K-DUR,KLOR-CON) 20 MEQ tablet Take 2 tablets (40 mEq total) by mouth 3 (three) times daily. 240 tablet 11  . rosuvastatin (CRESTOR) 20 MG tablet Take 1 tablet (20 mg total) by mouth every evening. 30 tablet 11  . torsemide (DEMADEX) 20 MG tablet Take 1 tablet (20 mg total) by mouth 2 (two) times daily. 60 tablet 10  . guaiFENesin-codeine 100-10 MG/5ML syrup Take 5 mLs by mouth 3 (three) times daily as needed for cough. 120 mL 0  . furosemide (LASIX) 20 MG tablet Take 1 tablet (20 mg total) by mouth 2 (two) times daily. 60 tablet 3  . metoprolol tartrate (LOPRESSOR) 50 MG tablet Take 1 tablet (50 mg total) by mouth 2 (two) times daily. 180 tablet 3   No facility-administered medications prior to visit.     Review of Systems  Review of Systems  Constitutional: Negative.   Respiratory: Positive for cough, shortness of breath and wheezing.   Cardiovascular: Negative.     Physical Exam  BP 124/62 (BP Location: Right Arm, Cuff Size: Normal)   Pulse 99   Temp 99.3 F (37.4 C)   Ht 5\' 9"  (1.753 m)   Wt 298 lb 6.4 oz (135.4 kg)   SpO2 94%   BMI 44.07 kg/m  Physical Exam Constitutional:      General: He is not in acute distress.    Appearance: He is well-developed. He is obese.  HENT:     Head: Normocephalic and atraumatic.  Eyes:     Pupils: Pupils are equal, round, and reactive to light.  Neck:     Musculoskeletal: Normal range of motion and neck supple.  Cardiovascular:     Rate and Rhythm: Normal rate and regular rhythm.  Pulmonary:     Effort: Pulmonary effort is normal. No respiratory distress.     Breath sounds: Wheezing present.    Abdominal:     General: Bowel sounds are normal.     Palpations: Abdomen is soft.     Tenderness: There is no abdominal tenderness.  Skin:    General: Skin is warm and dry.     Findings: No erythema or rash.  Neurological:     Mental Status: He is alert and oriented to person, place, and time.  Psychiatric:        Behavior: Behavior normal.        Judgment: Judgment normal.      Lab Results:  CBC    Component Value Date/Time   WBC 6.8 03/20/2017 1514   RBC 4.60 03/20/2017 1514   HGB 14.9 03/20/2017 1514   HCT 41.1 03/20/2017 1514   PLT 258 03/20/2017 1514   MCV 89.3 03/20/2017 1514   MCH 32.4 03/20/2017 1514   MCHC 36.3 (H) 03/20/2017 1514   RDW 12.8 03/20/2017 1514   LYMPHSABS 3,692 03/20/2017 1514   MONOABS 1.1 (H) 02/25/2015 0754   EOSABS 197 03/20/2017 1514   BASOSABS 41 03/20/2017 1514    BMET    Component Value Date/Time   NA 137 08/27/2017 1637   K 3.8 08/27/2017 1637   CL 102 08/27/2017 1637   CO2 24 08/27/2017 1637   GLUCOSE 115 (H) 08/27/2017 1637   GLUCOSE 151 (H) 03/20/2017 1514   BUN 11 08/27/2017 1637   CREATININE 1.12 08/27/2017 1637   CREATININE 1.01 03/20/2017 1514   CALCIUM 9.1 08/27/2017 1637   GFRNONAA 70 08/27/2017 1637   GFRAA 80 08/27/2017 1637    BNP    Component Value Date/Time   BNP 30.3 03/24/2016 1519    ProBNP No results found for: PROBNP  Imaging: No results found.   Assessment & Plan:   Bronchitis - Tx Zpack as prescribed  - Recommend using Albuterol nebulizer every 6 hours for sob/wheezing - RX hycodan for cough suppressant. PMP awareness tool accessed, not unexpected prescriptions found      Martyn Ehrich, NP 02/12/2018

## 2018-02-12 NOTE — Assessment & Plan Note (Signed)
-   Tx Zpack as prescribed  - Recommend using Albuterol nebulizer every 6 hours for sob/wheezing - RX hycodan for cough suppressant. PMP awareness tool accessed, not unexpected prescriptions found

## 2018-03-02 ENCOUNTER — Other Ambulatory Visit: Payer: Self-pay | Admitting: Physician Assistant

## 2018-03-02 DIAGNOSIS — I10 Essential (primary) hypertension: Secondary | ICD-10-CM

## 2018-03-03 ENCOUNTER — Other Ambulatory Visit: Payer: Self-pay | Admitting: *Deleted

## 2018-03-03 ENCOUNTER — Telehealth: Payer: Self-pay | Admitting: Physician Assistant

## 2018-03-03 DIAGNOSIS — I10 Essential (primary) hypertension: Secondary | ICD-10-CM

## 2018-03-03 MED ORDER — LOSARTAN POTASSIUM 50 MG PO TABS: 25.0000 mg | ORAL_TABLET | Freq: Every day | ORAL | 1 refills | Status: DC

## 2018-03-03 NOTE — Telephone Encounter (Signed)
Spoke with the pt and advised him [facility] recommendation re: the Naproxen.. he is going to talk with Dr. Danielle Dess with [facility] to see if he has any other recommendations instead of an NSAID. He will call back and let us know what he starts so we can add to his med list.

## 2018-03-03 NOTE — Telephone Encounter (Signed)
Naproxen increases cardiovascular risk and BP. Ok to take since prescribed, but would recommend lowest effective dose for shortest duration.

## 2018-03-03 NOTE — Telephone Encounter (Signed)
New Message:    Patient calling concern about some medication that his foot doctor. Naprohen 500 mg. Patient calling and would like to know if he should be taking this medication. Please call patient.

## 2018-03-03 NOTE — Telephone Encounter (Signed)
Spoke with the pt and he reports that he was put on Naproxen 500mg  once a day... for foot problems... advised him that I will forward to our pharmacist to check and see if appropriate with his other meds and history. Pt agreed.

## 2018-03-10 ENCOUNTER — Other Ambulatory Visit: Payer: Self-pay | Admitting: Physician Assistant

## 2018-03-15 ENCOUNTER — Other Ambulatory Visit: Payer: Self-pay | Admitting: Family Medicine

## 2018-03-23 LAB — HEPATIC FUNCTION PANEL
ALT: 9 — AB (ref 10–40)
AST: 15 (ref 14–40)
Bilirubin, Total: 0.6

## 2018-03-23 LAB — BASIC METABOLIC PANEL
BUN: 13 (ref 4–21)
CO2: 27 — AB (ref 13–22)
Chloride: 102 (ref 99–108)
Creatinine: 1.1 (ref 0.6–1.3)
Glucose: 123
Potassium: 3.7 (ref 3.4–5.3)
Sodium: 137 (ref 137–147)

## 2018-03-23 LAB — LIPID PANEL
Cholesterol: 155 (ref 0–200)
Cholesterol: 155 (ref 0–200)
HDL: 52 (ref 35–70)
HDL: 52 (ref 35–70)
LDL Cholesterol: 63
LDL Cholesterol: 63
Triglycerides: 198 — AB (ref 40–160)
Triglycerides: 198 — AB (ref 40–160)

## 2018-03-23 LAB — TSH
TSH: 2.99 (ref 0.41–5.90)
TSH: 2.99 (ref <=5.90)

## 2018-03-23 LAB — COMPREHENSIVE METABOLIC PANEL
Albumin: 3.8 (ref 3.5–5.0)
Calcium: 9.4 (ref 8.7–10.7)
GFR calc Af Amer: 82
GFR calc non Af Amer: 63

## 2018-03-24 LAB — MICROALBUMIN, URINE: Microalb, Ur: 22.48

## 2018-04-18 ENCOUNTER — Other Ambulatory Visit: Payer: Self-pay | Admitting: Physician Assistant

## 2018-06-02 ENCOUNTER — Other Ambulatory Visit: Payer: Self-pay | Admitting: Physician Assistant

## 2018-06-09 ENCOUNTER — Other Ambulatory Visit: Payer: Self-pay | Admitting: Physician Assistant

## 2018-06-09 ENCOUNTER — Telehealth: Payer: Self-pay | Admitting: Internal Medicine

## 2018-06-09 NOTE — Telephone Encounter (Signed)
Attempted to return call at 5:45 pm but placed in voice que and unable to get through.  Will need to call back 06/10/18.

## 2018-06-10 NOTE — Telephone Encounter (Signed)
Attempted to call at 8:30 not opened voice message call back at a later time.

## 2018-06-14 ENCOUNTER — Telehealth: Payer: Self-pay | Admitting: Internal Medicine

## 2018-06-14 MED ORDER — FLUTICASONE PROPIONATE 50 MCG/ACT NA SUSP: 1.0000 | Freq: Every day | NASAL | 12 refills | Status: DC

## 2018-06-14 MED ORDER — HYDROCODONE-HOMATROPINE 5-1.5 MG/5ML PO SYRP: 5.0000 mL | ORAL_SOLUTION | Freq: Four times a day (QID) | ORAL | 0 refills | Status: DC | PRN

## 2018-06-14 NOTE — Telephone Encounter (Signed)
Hycodan refill e-sent

## 2018-06-14 NOTE — Telephone Encounter (Signed)
Pt aware. Nothing further needed 

## 2018-06-14 NOTE — Telephone Encounter (Signed)
Refill of flonase nasal spray has been sent to [name] [name]'s preferred pharmacy.  Called and spoke with [name] [name] to verify which cough syrup he was requesting a refill of and [name] [name] stated he was requesting a refill of Hycodan 5-1.5mg /93ml cough syrup. Rx was prescribed by Derl Barrow 02/12/2018 with instructions for [name] [name] to take 70mls every 6 hours prn #268ml with  0 RF.  [name] [name] stated that his cough is a dry cough which began again x5 days ago. [name] [name] denies any complaints of fever or SOB just stated that he is now coughing again.  Dr. Annamaria Boots, please advise if you are okay refilling the Hycodan cough syrup for [name] [name] and if you are, Rx needs to be sent to [facility] off [address]. Thanks!  Allergies  Allergen Reactions  . Ibuprofen Other (See Comments)    Stomach pain     Current Outpatient Medications:  .  ACCU-CHEK AVIVA PLUS test strip, , Disp: , Rfl:  .  albuterol (PROVENTIL HFA;VENTOLIN HFA) 108 (90 Base) MCG/ACT inhaler, Inhale 2 puffs into the lungs every 6 (six) hours as needed for wheezing or shortness of breath., Disp: 1 Inhaler, Rfl: 12 .  albuterol (PROVENTIL) (2.5 MG/3ML) 0.083% nebulizer solution, Take 3 mLs (2.5 mg total) by nebulization every 6 (six) hours as needed for wheezing or shortness of breath., Disp: 75 mL, Rfl: prn .  aspirin EC 81 MG tablet, Take 81 mg by mouth daily. , Disp: , Rfl:  .  azithromycin (ZITHROMAX) 250 MG tablet, Zpack taper as directed, Disp: 6 tablet, Rfl: 0 .  B-D ULTRAFINE III SHORT PEN 31G X 8 MM MISC, , Disp: , Rfl: 1 .  diclofenac sodium (VOLTAREN) 1 % GEL, APPLY 2 GRAMS TOPICALLY 4 TIMES DAILY TO KNEE, Disp: 100 g, Rfl: 1 .  eplerenone (INSPRA) 25 MG tablet, TAKE 1 TABLET BY MOUTH ONCE DAILY, Disp: 30 tablet, Rfl: 8 .  fluticasone (FLONASE) 50 MCG/ACT nasal spray, Place 1 spray into both nostrils daily., Disp: 16 g, Rfl: 12 .  furosemide (LASIX) 20 MG tablet, Take 1 tablet (20 mg total) by mouth 2 (two) times daily., Disp: 60 tablet, Rfl: 3 .  HUMALOG KWIKPEN 200 UNIT/ML  SOPN, Inject 10-30 Units into the skin 3 (three) times daily with meals. 20 units with smaller meals 30 units with regular meals and 10 units with snacks. Sliding Scale, Disp: , Rfl: 0 .  HYDROcodone-homatropine (HYCODAN) 5-1.5 MG/5ML syrup, Take 5 mLs by mouth every 6 (six) hours as needed for cough., Disp: 240 mL, Rfl: 0 .  ipratropium (ATROVENT) 0.02 % nebulizer solution, Take 2.5 mLs (0.5 mg total) by nebulization every 6 (six) hours as needed for wheezing or shortness of breath., Disp: 75 mL, Rfl: prn .  ipratropium (ATROVENT) 0.06 % nasal spray, Place 2 sprays into both nostrils 4 (four) times daily., Disp: 15 mL, Rfl: 0 .  Lancets (ACCU-CHEK MULTICLIX) lancets, , Disp: , Rfl:  .  LANTUS SOLOSTAR 100 UNIT/ML Solostar Pen, Inject 70 Units into the skin every morning. , Disp: , Rfl: 0 .  losartan (COZAAR) 50 MG tablet, Take 0.5 tablets (25 mg total) by mouth daily., Disp: 45 tablet, Rfl: 1 .  Magnesium Oxide 400 (240 Mg) MG TABS, TAKE 1 TABLET(400 MG) BY MOUTH DAILY, Disp: 90 tablet, Rfl: 0 .  Magnesium Oxide 400 MG CAPS, Take 1 capsule (400 mg total) by mouth daily., Disp: 30 capsule, Rfl: 0 .  metoprolol tartrate (LOPRESSOR) 50 MG tablet, Take 1 tablet (50 mg total)  by mouth 2 (two) times daily. [name] [name] needs to call and make appt by June to receive further refills - 1st attempt, Disp: 180 tablet, Rfl: 0 .  nitroGLYCERIN (NITROSTAT) 0.4 MG SL tablet, PLACE 1 TABLET UNDER THE TONGUE EVERY 5 MINUTES IF NEEDED, Disp: 25 tablet, Rfl: 0 .  potassium chloride SA (K-DUR,KLOR-CON) 20 MEQ tablet, Take 2 tablets (40 mEq total) by mouth 3 (three) times daily., Disp: 240 tablet, Rfl: 11 .  rosuvastatin (CRESTOR) 20 MG tablet, Take 1 tablet (20 mg total) by mouth every evening., Disp: 30 tablet, Rfl: 11 .  torsemide (DEMADEX) 20 MG tablet, Take 1 tablet (20 mg total) by mouth 2 (two) times daily. [name] [name] needs to make appt to get further refills, Disp: 180 tablet, Rfl: 0

## 2018-06-21 ENCOUNTER — Telehealth: Payer: Self-pay | Admitting: *Deleted

## 2018-06-21 NOTE — Telephone Encounter (Signed)
YOUR CARDIOLOGY TEAM HAS ARRANGED FOR AN E-VISIT FOR YOUR APPOINTMENT - PLEASE REVIEW IMPORTANT INFORMATION BELOW SEVERAL DAYS PRIOR TO YOUR APPOINTMENT  Due to the recent COVID-19 pandemic, we are transitioning in-person office visits to tele-medicine visits in an effort to decrease unnecessary exposure to our patients, their families, and staff. These visits are billed to your insurance just like a normal visit is. We also encourage you to sign up for MyChart if you have not already done so. You will need a smartphone if possible. For patients that do not have this, we can still complete the visit using a regular telephone but do prefer a smartphone to enable video when possible. You may have a family member that lives with you that can help. If possible, we also ask that you have a blood pressure cuff and scale at home to measure your blood pressure, heart rate and weight prior to your scheduled appointment. Patients with clinical needs that need an in-person evaluation and testing will still be able to come to the office if absolutely necessary. If you have any questions, feel free to call our office.     YOUR PROVIDER WILL BE USING THE FOLLOWING PLATFORM TO COMPLETE YOUR VISIT: [email or web address]   . IF USING MYCHART - How to Download the MyChart App to Your SmartPhone   - If Apple, go to [employer] and type in MyChart in the search bar and download the app. If Android, ask patient to go to [employer] and type in [place] in the search bar and download the app. The app is free but as with any other app downloads, your phone may require you to verify saved payment information or Apple/Android password.  - You will need to then log into the app with your MyChart username and password, and select [facility] as your healthcare provider to link the account.  - When it is time for your visit, go to the MyChart app, find appointments, and click Begin Video Visit. Be sure to Select Allow for your device to  access the Microphone and Camera for your visit. You will then be connected, and your provider will be with you shortly.  **If you have any issues connecting or need assistance, please contact MyChart service desk [phone])**  **If using a computer, in order to ensure the best quality for your visit, you will need to use either of the following Internet Browsers: Insurance underwriter or [employer]**  . IF USING DOXIMITY or [email or web address] - The staff will give you instructions on receiving your link to join the meeting the day of your visit.      2-3 DAYS BEFORE YOUR APPOINTMENT  You will receive a telephone call from one of our [place] team members - your caller ID may say "Unknown caller." If this is a video visit, we will walk you through how to get the video launched on your phone. We will remind you check your blood pressure, heart rate and weight prior to your scheduled appointment. If you have an Apple Watch or Kardia, please upload any pertinent ECG strips the day before or morning of your appointment to [place]. Our staff will also make sure you have reviewed the consent and agree to move forward with your scheduled tele-health visit.     THE DAY OF YOUR APPOINTMENT  Approximately 15 minutes prior to your scheduled appointment, you will receive a telephone call from one of [place] team - your caller ID may say "Unknown caller."  Our staff will confirm medications, vital signs for the day and any symptoms you may be experiencing. Please have this information available prior to the time of visit start. It may also be helpful for you to have a pad of paper and pen handy for any instructions given during your visit. They will also walk you through joining the smartphone meeting if this is a video visit.    CONSENT FOR TELE-HEALTH VISIT - PLEASE REVIEW  PT GAVE VERBAL CONSENT.. DENIED [place]  I hereby voluntarily request, consent and authorize [facility] and its employed  or contracted physicians, physician assistants, nurse practitioners or other licensed health care professionals (the Practitioner), to provide me with telemedicine health care services (the "Services") as deemed necessary by the treating Practitioner. I acknowledge and consent to receive the Services by the Practitioner via telemedicine. I understand that the telemedicine visit will involve communicating with the Practitioner through live audiovisual communication technology and the disclosure of certain medical information by electronic transmission. I acknowledge that I have been given the opportunity to request an in-person assessment or other available alternative prior to the telemedicine visit and am voluntarily participating in the telemedicine visit.  I understand that I have the right to withhold or withdraw my consent to the use of telemedicine in the course of my care at any time, without affecting my right to future care or treatment, and that the Practitioner or I may terminate the telemedicine visit at any time. I understand that I have the right to inspect all information obtained and/or recorded in the course of the telemedicine visit and may receive copies of available information for a reasonable fee.  I understand that some of the potential risks of receiving the Services via telemedicine include:  Marland Kitchen Delay or interruption in medical evaluation due to technological equipment failure or disruption; . Information transmitted may not be sufficient (e.g. poor resolution of images) to allow for appropriate medical decision making by the Practitioner; and/or  . In rare instances, security protocols could fail, causing a breach of personal health information.  Furthermore, I acknowledge that it is my responsibility to provide information about my medical history, conditions and care that is complete and accurate to the best of my ability. I acknowledge that Practitioner's advice, recommendations,  and/or decision may be based on factors not within their control, such as incomplete or inaccurate data provided by me or distortions of diagnostic images or specimens that may result from electronic transmissions. I understand that the practice of medicine is not an exact science and that Practitioner makes no warranties or guarantees regarding treatment outcomes. I acknowledge that I will receive a copy of this consent concurrently upon execution via email to the email address I last provided but may also request a printed copy by calling the office of [facility].    I understand that my insurance will be billed for this visit.   I have read or had this consent read to me. . I understand the contents of this consent, which adequately explains the benefits and risks of the Services being provided via telemedicine.  . I have been provided ample opportunity to ask questions regarding this consent and the Services and have had my questions answered to my satisfaction. . I give my informed consent for the services to be provided through the use of telemedicine in my medical care  By participating in this telemedicine visit I agree to the above.

## 2018-06-23 ENCOUNTER — Encounter: Payer: Self-pay | Admitting: Physician Assistant

## 2018-06-23 LAB — HEPATIC FUNCTION PANEL
ALT: 9 — AB (ref 10–40)
ALT: 9 — AB (ref 10–40)
AST: 15 (ref 14–40)
AST: 15 (ref 14–40)
Bilirubin, Total: 0.6
Bilirubin, Total: 0.6

## 2018-06-23 LAB — BASIC METABOLIC PANEL
BUN: 12 (ref 4–21)
BUN: 12 (ref 4–21)
CO2: 28 — AB (ref 13–22)
CO2: 28 — AB (ref 13–22)
Chloride: 100 (ref 99–108)
Chloride: 100 (ref 99–108)
Creatinine: 1.2 (ref 0.6–1.3)
Creatinine: 1.2 (ref 0.6–1.3)
Glucose: 169
Potassium: 3.7 (ref 3.4–5.3)
Potassium: 3.7 (ref 3.4–5.3)
Sodium: 138 (ref 137–147)
Sodium: 138 (ref 137–147)

## 2018-06-23 LAB — COMPREHENSIVE METABOLIC PANEL
Albumin: 3.6 (ref 3.5–5.0)
Albumin: 3.6 (ref 3.5–5.0)
Calcium: 9.1 (ref 8.7–10.7)
Calcium: 9.1 (ref 8.7–10.7)
GFR calc Af Amer: 76
GFR calc Af Amer: 76
GFR calc non Af Amer: 63
GFR calc non Af Amer: 63

## 2018-06-23 LAB — HEMOGLOBIN A1C: Hemoglobin A1C: 9.1

## 2018-06-23 NOTE — Progress Notes (Signed)
Virtual Visit via Video Note   This visit type was conducted due to national recommendations for restrictions regarding the COVID-19 Pandemic (e.g. social distancing) in an effort to limit this patient's exposure and mitigate transmission in our community.  Due to his co-morbid illnesses, this patient is at least at moderate risk for complications without adequate follow up.  This format is felt to be most appropriate for this patient at this time.  All issues noted in this document were discussed and addressed.  A limited physical exam was performed with this format.  Please refer to the patient's chart for his consent to telehealth for [facility].   Date:  06/24/2018   ID:  [name] [name], DOB [birth year], MRN [id number]  Patient Location: Home Provider Location: Home  PCP:  Briscoe Deutscher, DO  Cardiologist:  [name], MD -> to eventually transition to Dr. Marlou Porch upon Dr. Darnelle Bos change to [facility] office Electrophysiologist:  None   Evaluation Performed:  Follow-Up Visit  Chief Complaint:  Routine f/u rhythm, CAD, CHF  History of Present Illness:    [name] [name] is a 63 y.o. male with CAD s/p CABG 2005, morbid obesity, chronic diastolic CHF (BNP normal 03/9189), hypokalemia (intermittently noncompliant with supplementation), probable CKD stage II, ectopic atrial tachycardia, essential HTN, RBBB, pancreatitis, cirrhosis, HLD, varicose veins, neuropathy, OSA (followed by Dr. Annamaria Boots) who presents for follow-up.   Overall he has done well since bypass with several nuclear stress tests since then, with last nuclear stress test in 2015 that was normal, non-gated. 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 He stated his father prayed over his liver for 12 hours straight and he has been told it has been healed ever since then. He has not touched alcohol since. He has  been followed by [facility] for varicose veins with recommendation for compression. He recently saw PCP 03/2017 with complaints of intermittent heart racing and chest tightness. EKG appeared to show NSR with irregular rhythm, intermittent ectopic atrial beating, and chronic RBBB. (The computer interpretation was Afib/flutter but per review with cardiology MD, not felt to be the case.) He was seen in cardiology clinic 04/27/17 at which time rhythm strips continued to show runs of what appeared to be an low ectopic atrial rhythm (just shy of tachycardic) upon review by Dr. Curt Bears. His metoprolol was increased to BID. He was also found to be hypokalemic and hypomagnesemic requiring supplementation. 2D echo 05/07/17 showed EF 66-06%, normal diastolic parameters, mild MR, mildly dilated RV, moderately dilated RA, poor acoustic windows. Last labs by [facility] 03/2018 showed albumin 3.8, AST/ALT wnl, BUN 13, Cr 1.09 (improved from prior), Tcho 155, LDL 63, plt 258, K 3.7, TSH wnl, Na 137, Hgb 14.9. He reports endocrinologist just took more blood yesterday too which has not yet resulted.  He returns for virtual follow-up overall saying he feels well. He did notice increased SOB and swelling last week when he began to skip his PM dose of torsemide. His SOB resolved once he got back on track with usual dosing. He was able to walk around a long time yesterday without any particular cardiac limitation. He has not had any CP. Of note, he always has some degree of edema related to venous reflux - he finds it resolves when he elevates his legs which he hasn't been faithful with lately. No palpitations or racing heart lately. Furosemide  and torsemide were on his list below - he confirms he is only on torsemide. The patient does not have symptoms concerning for COVID-19 infection (fever, chills, cough, or new shortness of breath).    Past Medical History:  Diagnosis Date   Acute myocardial infarction, unspecified site, episode of care  unspecified 2005   Acute pancreatitis    CAD (coronary artery disease)    a. CABG in 2005 w LIMA to LAD, left radial to second circumflex marginal, saphenous vein graft to PDA, saphenous vein graft to lateral subbrach of ramus intermediate, and sequential saphenous vein graft to the medial subbranch of ramus intermediate.   Chronic diastolic CHF (congestive heart failure) (HCC)    Cirrhosis of liver without mention of alcohol    CKD (chronic kidney disease), stage II    Complications affecting other specified body systems, hypertension    Ectopic atrial rhythm    Essential hypertension    Hyperlipidemia    Hypokalemia    a. intermitttent noncompliance with potassium supplement.   Hypomagnesemia    Morbid obesity (HCC)    Neuropathy    Other and unspecified hyperlipidemia    Proteinuria    RBBB    Sleep apnea    Type II or unspecified type diabetes mellitus without mention of complication, not stated as uncontrolled    Varicose veins of both lower extremities    Past Surgical History:  Procedure Laterality Date   CORONARY ARTERY BYPASS GRAFT     ENT surgery       Current Meds  Medication Sig   ACCU-CHEK AVIVA PLUS test strip    albuterol (PROVENTIL HFA;VENTOLIN HFA) 108 (90 Base) MCG/ACT inhaler Inhale 2 puffs into the lungs every 6 (six) hours as needed for wheezing or shortness of breath.   albuterol (PROVENTIL) (2.5 MG/3ML) 0.083% nebulizer solution Take 3 mLs (2.5 mg total) by nebulization every 6 (six) hours as needed for wheezing or shortness of breath.   aspirin EC 81 MG tablet Take 81 mg by mouth daily.    B-D ULTRAFINE III SHORT PEN 31G X 8 MM MISC    diclofenac sodium (VOLTAREN) 1 % GEL APPLY 2 GRAMS TOPICALLY 4 TIMES DAILY TO KNEE   eplerenone (INSPRA) 25 MG tablet TAKE 1 TABLET BY MOUTH ONCE DAILY   fluticasone (FLONASE) 50 MCG/ACT nasal spray Place 1 spray into both nostrils daily.   furosemide (LASIX) 20 MG tablet Take 1 tablet (20 mg  total) by mouth 2 (two) times daily. Patient confirms he is not taking   HUMALOG KWIKPEN 200 UNIT/ML SOPN Inject 10-30 Units into the skin 3 (three) times daily with meals. 20 units with smaller meals 30 units with regular meals and 10 units with snacks. Sliding Scale   HYDROcodone-homatropine (HYCODAN) 5-1.5 MG/5ML syrup Take 5 mLs by mouth every 6 (six) hours as needed for cough.   ipratropium (ATROVENT) 0.02 % nebulizer solution Take 2.5 mLs (0.5 mg total) by nebulization every 6 (six) hours as needed for wheezing or shortness of breath.   ipratropium (ATROVENT) 0.06 % nasal spray Place 2 sprays into both nostrils 4 (four) times daily.   Lancets (ACCU-CHEK MULTICLIX) lancets    LANTUS SOLOSTAR 100 UNIT/ML Solostar Pen Inject 67 Units into the skin every morning.    losartan (COZAAR) 50 MG tablet Take 0.5 tablets (25 mg total) by mouth daily.   Magnesium Oxide 400 MG CAPS Take 1 capsule (400 mg total) by mouth daily.   metoprolol tartrate (LOPRESSOR) 50 MG tablet Take  1 tablet (50 mg total) by mouth 2 (two) times daily. Pt needs to call and make appt by June to receive further refills - 1st attempt   nitroGLYCERIN (NITROSTAT) 0.4 MG SL tablet PLACE 1 TABLET UNDER THE TONGUE EVERY 5 MINUTES IF NEEDED   potassium chloride SA (K-DUR,KLOR-CON) 20 MEQ tablet Take 2 tablets (40 mEq total) by mouth 3 (three) times daily.   rosuvastatin (CRESTOR) 20 MG tablet Take 1 tablet (20 mg total) by mouth every evening.   torsemide (DEMADEX) 20 MG tablet Take 1 tablet (20 mg total) by mouth 2 (two) times daily. Pt needs to make appt to get further refills   [DISCONTINUED] Magnesium Oxide 400 (240 Mg) MG TABS TAKE 1 TABLET(400 MG) BY MOUTH DAILY     Allergies:   Ibuprofen   Social History   Tobacco Use   Smoking status: Former Smoker    Packs/day: 1.50    Years: 30.00    Pack years: 45.00    Last attempt to quit: 10/20/2004    Years since quitting: 13.6   Smokeless tobacco: Never Used    Substance Use Topics   Alcohol use: Yes    Comment: occasional   Drug use: No     Family Hx: The patient's family history includes Breast cancer in his mother; Coronary artery disease in his father; Prostate cancer in his father.  ROS:   Please see the history of present illness.    All other systems reviewed and are negative.   Prior CV studies:   Most recent pertinent cardiac studies are outlined above.   Labs/Other Tests and Data Reviewed:    EKG:  An ECG dated 08/27/17 was personally reviewed today and demonstrated:  Normal sinus rhythm and intermittent ectopic atrial rhythm with left axis deviation, LVH, nonspecific ST/T changes, Qtc 457ms (in setting of QRS widening)  Recent Labs: 08/27/2017: BUN 11; Creatinine, Ser 1.12; Magnesium 2.1; Potassium 3.8; Sodium 137   Recent Lipid Panel Lab Results  Component Value Date/Time   CHOL 154 01/08/2010 08:37 AM   TRIG 166.0 (H) 01/08/2010 08:37 AM   HDL 40.90 01/08/2010 08:37 AM   CHOLHDL 4 01/08/2010 08:37 AM   LDLCALC 80 01/08/2010 08:37 AM    Wt Readings from Last 3 Encounters:  06/24/18 298 lb 6.4 oz (135.4 kg)  02/12/18 298 lb 6.4 oz (135.4 kg)  08/27/17 299 lb (135.6 kg)     Objective:    Vital Signs:  BP 120/61    Pulse 89    Ht 5\' 9"  (1.753 m)    Wt 298 lb 6.4 oz (135.4 kg)    BMI 44.07 kg/m    VITAL SIGNS:  reviewed  General - obese pleasant AAM in no acute distress Pulm - No labored breathing, no coughing during visit, no audible wheezing, speaking in full sentences Neuro - A+Ox3, no slurred speech, answers questions appropriately Extremities - trace BLE edema Psych - Pleasant affect  ASSESSMENT & PLAN:    1. Ectoptic atrial rhythm - asymptomatic. HR controlled by today's visit. Given asymptomatic nature, will hold course with current beta blocker dose and plan EKG at next OV in September. He will call if he experiences any breakthrough symptoms. 2. Chronic diastolic CHF - likely had exacerbation last  week in setting of skipping PM torsemide dose. He is now back to baseline. If symptoms recur, will need OV. I told him he can take torsemide all in one dose (40mg  qam) if needed, just that I think he  likely needs 40mg  total daily to maintain euvolemia. He will touch base with vascular team about compression hose recommendations given venous disease. Will remove erroneous furosemide from med list. 3. CAD - no recent angina. Continue ASA, BB, statin. LDL at goal in 03/2018. 4. Hypokalemia - recheck in 03/2018 was much improved. He reports his endocrinologist rechecked this yesterday and is awaiting results. He is on diuretics torsemide and epleronone. Med compliance encouraged.  COVID-19 Education: The signs and symptoms of COVID-19 were discussed with the patient and how to seek care for testing (follow up with PCP or arrange E-visit).  The importance of social distancing was discussed today.  Time:   Today, I have spent 22 minutes with the patient with telehealth technology discussing the above problems.     Medication Adjustments/Labs and Tests Ordered: Current medicines are reviewed at length with the patient today.  Concerns regarding medicines are outlined above.   Disposition:  Follow up September 2020 with Dr. Marlou Porch to establish care for in-person visit  Signed, Felipa Evener  06/24/2018 11:26 AM    [place]

## 2018-06-24 ENCOUNTER — Other Ambulatory Visit: Payer: Self-pay

## 2018-06-24 ENCOUNTER — Telehealth (INDEPENDENT_AMBULATORY_CARE_PROVIDER_SITE_OTHER): Payer: Medicare Other | Admitting: Physician Assistant

## 2018-06-24 ENCOUNTER — Encounter: Payer: Self-pay | Admitting: Physician Assistant

## 2018-06-24 VITALS — BP 120/61 | HR 89 | Ht 69.0 in | Wt 298.4 lb

## 2018-06-24 DIAGNOSIS — I2510 Atherosclerotic heart disease of native coronary artery without angina pectoris: Secondary | ICD-10-CM

## 2018-06-24 DIAGNOSIS — I10 Essential (primary) hypertension: Secondary | ICD-10-CM

## 2018-06-24 DIAGNOSIS — I491 Atrial premature depolarization: Secondary | ICD-10-CM | POA: Diagnosis not present

## 2018-06-24 DIAGNOSIS — E876 Hypokalemia: Secondary | ICD-10-CM

## 2018-06-24 DIAGNOSIS — I25708 Atherosclerosis of coronary artery bypass graft(s), unspecified, with other forms of angina pectoris: Secondary | ICD-10-CM

## 2018-06-24 DIAGNOSIS — I5032 Chronic diastolic (congestive) heart failure: Secondary | ICD-10-CM

## 2018-06-24 MED ORDER — POTASSIUM CHLORIDE CRYS ER 20 MEQ PO TBCR: 40.0000 meq | EXTENDED_RELEASE_TABLET | Freq: Three times a day (TID) | ORAL | 11 refills | Status: DC

## 2018-06-24 MED ORDER — NITROGLYCERIN 0.4 MG SL SUBL: 0.4000 mg | SUBLINGUAL_TABLET | SUBLINGUAL | 0 refills | Status: DC | PRN

## 2018-06-24 MED ORDER — METOPROLOL TARTRATE 50 MG PO TABS: 50.0000 mg | ORAL_TABLET | Freq: Two times a day (BID) | ORAL | 3 refills | Status: DC

## 2018-06-24 MED ORDER — ROSUVASTATIN CALCIUM 20 MG PO TABS: 20.0000 mg | ORAL_TABLET | Freq: Every evening | ORAL | 11 refills | Status: DC

## 2018-06-24 MED ORDER — LOSARTAN POTASSIUM 50 MG PO TABS: 25.0000 mg | ORAL_TABLET | Freq: Every day | ORAL | 1 refills | Status: DC

## 2018-06-24 MED ORDER — TORSEMIDE 20 MG PO TABS: 20.0000 mg | ORAL_TABLET | Freq: Two times a day (BID) | ORAL | 3 refills | Status: DC

## 2018-06-24 NOTE — Patient Instructions (Addendum)
Medication Instructions:  Your physician recommends that you continue on your current medications as directed. Please refer to the Current Medication list given to you today.   Make sure to take 2 torsemide tablets a day and don't skip doses. This can either be all at once or split up a few hours apart.   If you need a refill on your cardiac medications before your next appointment, please call your pharmacy.   Lab work: None ordered  If you have labs (blood work) drawn today and your tests are completely normal, you will receive your results only by: Marland Kitchen MyChart Message (if you have MyChart) OR . A paper copy in the mail If you have any lab test that is abnormal or we need to change your treatment, we will call you to review the results.  Testing/Procedures: None ordered   Follow-Up: You have been scheduled to see Dr. Candee Furbish, 10/25/2018 at 10:40 a.m., in the office.  Please arrive 15 mins prior to your appointment for registration,  Any Other Special Instructions Will Be Listed Below (If Applicable)  For patients with congestive heart failure, we give them these special instructions:  1. Follow a low-salt diet - you are allowed no more than 2,000mg  of sodium per day. Watch your fluid intake. In general, you should not be taking in more than 2 liters of fluid per day (no more than 8 glasses per day). This includes sources of water in foods like soup, coffee, tea, milk, etc.  2. Weigh yourself on the same scale at same time of day and keep a log.  3. Call your doctor: (Anytime you feel any of the following symptoms)  - 3lb weight gain overnight or 5lb within a few days  - Shortness of breath, with or without a dry hacking cough  - Swelling in the hands, feet or stomach  - If you have to sleep on extra pillows at night in order to breathe   IT IS IMPORTANT TO LET YOUR DOCTOR KNOW EARLY ON IF YOU ARE HAVING SYMPTOMS SO WE CAN HELP YOU!

## 2018-06-29 ENCOUNTER — Telehealth: Payer: Self-pay | Admitting: Physician Assistant

## 2018-06-29 NOTE — Telephone Encounter (Signed)
New message   Pt c/o medication issue:  1. Name of Medication:metoprolol tartrate (LOPRESSOR) 50 MG tablet  2. How are you currently taking this medication (dosage and times per day)? Twice daily  3. Are you having a reaction (difficulty breathing--STAT)? No    4. What is your medication issue? Patient has question about how to take this medication. Please call.

## 2018-06-29 NOTE — Telephone Encounter (Signed)
Returned pts call.  He wanted to go back over the med changes made in his virtual visit this week. Pt made aware no changes were made, just reiterated to pt to make sure take 2 Torsemides a day and don't skip any doses. Pt thanked me for the call and verbalized understanding,.

## 2018-07-01 ENCOUNTER — Other Ambulatory Visit: Payer: Self-pay | Admitting: Physician Assistant

## 2018-07-01 DIAGNOSIS — I25708 Atherosclerosis of coronary artery bypass graft(s), unspecified, with other forms of angina pectoris: Secondary | ICD-10-CM

## 2018-07-07 ENCOUNTER — Telehealth: Payer: Self-pay | Admitting: Physician Assistant

## 2018-07-07 ENCOUNTER — Telehealth: Payer: Self-pay | Admitting: Internal Medicine

## 2018-07-07 NOTE — Telephone Encounter (Signed)
STAT if HR is under 50 or over 120 (normal HR is 60-100 beats per minute)  1) What is your heart rate? Now it is 85 right noe 111 a few minutes ago, earlier 91 today and after he sit down for a while went down to 91  2) Do you have a log of your heart rate readings (document readings)check it every day  3) Do you have any other symptoms?no ,feels nervous when his rate is up , right now his blood pressure is 138/72

## 2018-07-07 NOTE — Telephone Encounter (Signed)
Called pt re: phone message below.  Pt states that he "thought' he missed his Metoprolol medication yesterday.  Pt will come for in office visit tomorrow with EKG.

## 2018-07-07 NOTE — Telephone Encounter (Signed)
Patient stated he has been having palpitations and his HR has been going up and down. Patient stated his HR 85 to 127. Asked patient if he has taking his metoprolol. Patient stated he takes his medications at 3 am. Informed patient that he should be taking his metoprolol twice daily, and if he took it at 3 am he should take it again now. Informed patient that metoprolol can help with his palpitations and lower his HR. Patient will start taking his metoprolol BID and will call our office back if he HR does not stay below 100. Will forward to [facility] PA, who recently had a virtual visit with her for further advisement.

## 2018-07-07 NOTE — Progress Notes (Signed)
Cardiology Office Note    Date:  07/08/2018  ID:  [name], DOB 15-Mar-1955, MRN [id number] PCP:  Briscoe Deutscher, DO  Cardiologist:  [name], MD (will establish with Dr. Marlou Porch for next OV)   Chief Complaint: palpitations  History of Present Illness:  [name] [name] is a 63 y.o. 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 Annamaria Boots) who presents for evaluation of palpitations.  Overall he has done well since bypass with several nuclear stress tests since then, with last nuclear stress test in 2015 that was normal, non-gated. 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 He stated his father prayed over his liver for 12 hours straight and he has been told it has been healed ever since then. He has not touched alcohol since. He has been followed by [facility] for varicose veins with recommendation for compression. He saw PCP 03/2017 with complaints of intermittent heart racing and chest tightness. EKG appeared to show NSR with irregular rhythm, intermittent ectopic atrial beating, and chronic RBBB. (The computer interpretation was Afib/flutter but per review with cardiology MD, not felt to be the case.) He was seen in cardiology clinic 04/2017 at which time rhythm strips continued to show runs of what appeared to be an low ectopic atrial rhythm (just shy of tachycardic) upon review by Dr. Curt Bears. His metoprolol was increased to BID. He was also found to be hypokalemic and hypomagnesemic requiring supplementation. 2D echo 05/07/17 showed EF 16-07%, normal diastolic parameters, mild MR,  mildly dilated RV, moderately dilated RA, poor acoustic windows. Last labs by [facility] 06/23/2018 showed albumin 3.6, AST/ALT OK, BUN 12, Cr 1.17, K 3.7, 03/2018 Tchol 155, LDL 63, plt 258, K 3.7, TSH wnl, Na 137, Hgb 14.9, 08/2017 Mg 2.1.  I saw him in virtual follow-up 06/24/18 and he was feeling relatively stable. However, he called in yesterday reporting increased heart rate, ranging 85-127. His NSR HR tends to be in the 80s-90s and atrial tachy rate in the 120s. He was brought in for an in-person OV with EKG today to exclude another persistent rhythm. His EKG shows NSR HR in the 80s. He now reports he thinks he had missed a few days of his metoprolol which had contributed. He's been spending a lot of time helping care for his brother and forgot to take it. His palpitations resolved with re-addition of his metoprolol. EKG shows NSR. He reports chronic stable dyspnea on exertion and no chest pain. No angina while doing yardwork. He does admit he needs to be more active as he's been rather sedentary.    Past Medical History:  Diagnosis Date   Acute myocardial infarction, unspecified site, episode of care unspecified 2005   Acute pancreatitis    CAD (coronary artery disease)    a. CABG in 2005 w LIMA to LAD, left radial to second circumflex marginal, saphenous vein graft to PDA, saphenous vein graft to lateral subbrach of ramus intermediate, and sequential saphenous vein graft to the medial subbranch of ramus intermediate.   Chronic diastolic CHF (congestive heart failure) (HCC)    Cirrhosis of liver without mention of alcohol    CKD (chronic  kidney disease), stage II    Complications affecting other specified body systems, hypertension    Ectopic atrial rhythm    Essential hypertension    Hyperlipidemia    Hypokalemia    a. intermitttent noncompliance with potassium supplement.   Hypomagnesemia    Morbid obesity (HCC)    Neuropathy    Other and unspecified hyperlipidemia     Proteinuria    RBBB    Sleep apnea    Type II or unspecified type diabetes mellitus without mention of complication, not stated as uncontrolled    Varicose veins of both lower extremities     Past Surgical History:  Procedure Laterality Date   CORONARY ARTERY BYPASS GRAFT     ENT surgery      Current Medications: Current Meds  Medication Sig   ACCU-CHEK AVIVA PLUS test strip    albuterol (PROVENTIL HFA;VENTOLIN HFA) 108 (90 Base) MCG/ACT inhaler Inhale 2 puffs into the lungs every 6 (six) hours as needed for wheezing or shortness of breath.   albuterol (PROVENTIL) (2.5 MG/3ML) 0.083% nebulizer solution Take 3 mLs (2.5 mg total) by nebulization every 6 (six) hours as needed for wheezing or shortness of breath.   aspirin EC 81 MG tablet Take 81 mg by mouth daily.    B-D ULTRAFINE III SHORT PEN 31G X 8 MM MISC    diclofenac sodium (VOLTAREN) 1 % GEL APPLY 2 GRAMS TOPICALLY 4 TIMES DAILY TO KNEE   eplerenone (INSPRA) 25 MG tablet TAKE 1 TABLET BY MOUTH ONCE DAILY   fluticasone (FLONASE) 50 MCG/ACT nasal spray Place 1 spray into both nostrils daily.   HUMALOG KWIKPEN 200 UNIT/ML SOPN Inject 10-30 Units into the skin 3 (three) times daily with meals. 20 units with smaller meals 30 units with regular meals and 10 units with snacks. Sliding Scale   HYDROcodone-homatropine (HYCODAN) 5-1.5 MG/5ML syrup Take 5 mLs by mouth every 6 (six) hours as needed for cough.   ipratropium (ATROVENT) 0.02 % nebulizer solution Take 2.5 mLs (0.5 mg total) by nebulization every 6 (six) hours as needed for wheezing or shortness of breath.   ipratropium (ATROVENT) 0.06 % nasal spray Place 2 sprays into both nostrils 4 (four) times daily.   Lancets (ACCU-CHEK MULTICLIX) lancets    LANTUS SOLOSTAR 100 UNIT/ML Solostar Pen Inject 67 Units into the skin every morning.    losartan (COZAAR) 50 MG tablet Take 0.5 tablets (25 mg total) by mouth daily.   Magnesium Oxide 400 MG CAPS Take 1 capsule (400  mg total) by mouth daily.   metoprolol tartrate (LOPRESSOR) 50 MG tablet Take 1 tablet (50 mg total) by mouth 2 (two) times daily.   nitroGLYCERIN (NITROSTAT) 0.4 MG SL tablet Place 1 tablet (0.4 mg total) under the tongue every 5 (five) minutes as needed for chest pain.   potassium chloride SA (K-DUR) 20 MEQ tablet Take 2 tablets (40 mEq total) by mouth 3 (three) times daily.   rosuvastatin (CRESTOR) 20 MG tablet Take 1 tablet (20 mg total) by mouth every evening.   torsemide (DEMADEX) 20 MG tablet Take 1 tablet (20 mg total) by mouth 2 (two) times daily.   [DISCONTINUED] simvastatin (ZOCOR) 80 MG tablet Take 80 mg by mouth daily.        Allergies:   Ibuprofen   Social History   Socioeconomic History   Marital status: Single    Spouse name: Not on file   Number of children: Not on file   Years of education: Not on  file   Highest education level: Not on file  Occupational History   Not on file  Social Needs   Financial resource strain: Not on file   Food insecurity:    Worry: Not on file    Inability: Not on file   Transportation needs:    Medical: Not on file    Non-medical: Not on file  Tobacco Use   Smoking status: Former Smoker    Packs/day: 1.50    Years: 30.00    Pack years: 45.00    Last attempt to quit: 10/20/2004    Years since quitting: 13.7   Smokeless tobacco: Never Used  Substance and Sexual Activity   Alcohol use: Yes    Comment: occasional   Drug use: No   Sexual activity: Not on file  Lifestyle   Physical activity:    Days per week: Not on file    Minutes per session: Not on file   Stress: Not on file  Relationships   Social connections:    Talks on phone: Not on file    Gets together: Not on file    Attends religious service: Not on file    Active member of club or organization: Not on file    Attends meetings of clubs or organizations: Not on file    Relationship status: Not on file  Other Topics Concern   Not on file    Social History Narrative   Lives alone, apartment "friends smoke". Disability. Is disabled secondary to his work-related injury.      Family History:  The patient's family history includes Breast cancer in his mother; Coronary artery disease in his father; Prostate cancer in his father.  ROS:   Please see the history of present illness.  All other systems are reviewed and otherwise negative.    PHYSICAL EXAM:   VS:  BP 108/76    Pulse 81    Ht 5\' 9"  (1.753 m)    Wt 298 lb (135.2 kg)    SpO2 98%    BMI 44.01 kg/m   BMI: Body mass index is 44.01 kg/m. GEN: Well nourished, well developed morbidly obese AAM, in no acute distress HEENT: normocephalic, atraumatic Neck: no JVD, carotid bruits, or masses Cardiac: RRR; no murmurs, rubs, or gallops, trace pretibial edema similar to prior Respiratory:  clear to auscultation bilaterally, normal work of breathing GI: soft, nontender, nondistended, + BS MS: no deformity or atrophy Skin: warm and dry, no rash, + varicose veins Neuro:  Alert and Oriented x 3, Strength and sensation are intact, follows commands Psych: euthymic mood, full affect  Wt Readings from Last 3 Encounters:  07/08/18 298 lb (135.2 kg)  06/24/18 298 lb 6.4 oz (135.4 kg)  02/12/18 298 lb 6.4 oz (135.4 kg)      Studies/Labs Reviewed:   EKG:  EKG was ordered today and personally reviewed by me and demonstrates NSR 81bpm, RBBB, left axis deviation, LVH  Recent Labs: 08/27/2017: BUN 11; Creatinine, Ser 1.12; Magnesium 2.1; Potassium 3.8; Sodium 137   Lipid Panel    Component Value Date/Time   CHOL 154 01/08/2010 0837   TRIG 166.0 (H) 01/08/2010 0837   HDL 40.90 01/08/2010 0837   CHOLHDL 4 01/08/2010 0837   VLDL 33.2 01/08/2010 0837   LDLCALC 80 01/08/2010 0837    Additional studies/ records that were reviewed today include: Summarized above.   ASSESSMENT & PLAN:   1. Tachycardia with h/o ectopic atrial rhythm - suspect breakthrough was exacerbated by  missing metoprolol. Med compliance emphasized. He is in NSR today. He will call if he has recurrent palpitations or worsening symptoms. If he does, would plan to hold losartan, titrate metoprolol, and obtain event monitor to trend HR. 2. Chronic diastolic CHF - he has had periodic exacerbations in settiing of missing torsemide dose but reports recent compliance. He appears generally euvolemic. Discussed importance of compression hose as previously suggested by [facility]. Also discussed importance of remaining active as tolerated. 3. CAD - no recent angina. Continue ASA, BB, statin. 4. Hypokalemia - recheck earlier this month was 3.7. He is already on a lot of supplementation. Given prior compliance issues I suspect this is the maximum he will be able to take.  Disposition: F/u with Dr. Marlou Porch as previously arranged 10/2018.  Medication Adjustments/Labs and Tests Ordered: Current medicines are reviewed at length with the patient today.  Concerns regarding medicines are outlined above. Medication changes, Labs and Tests ordered today are summarized above and listed in the Patient Instructions accessible in Encounters.   Signed, Charlie Pitter, PA-C  07/08/2018 12:29 PM    [facility] [place], [place], [place]  [address] Phone: [phone]; Fax: [phone]

## 2018-07-07 NOTE — Telephone Encounter (Signed)
Called [employer] they must have paper fax due to Medicare guidelines. They faxed order on 5/22 and will refax again today. CY is on vacation but another provider can sign. Pt says he has enough to last another week. Nothing further needed.

## 2018-07-07 NOTE — Telephone Encounter (Signed)
[name] [name] was previously aware that he should be taking his metoprolol BID as we specifically talked about it last visit.   If he has not been taking it BID, agree with plan to begin BID If he has been taking it BID, please increase metoprolol to 2 tablets BID for now.   Regardless, can we get him in tomorrow for an OV with EKG/rhythm strip with me to make sure he has not developed a new arrhythmia different than his previous form? Marisol Glazer PA-C

## 2018-07-08 ENCOUNTER — Other Ambulatory Visit: Payer: Self-pay

## 2018-07-08 ENCOUNTER — Encounter: Payer: Self-pay | Admitting: Physician Assistant

## 2018-07-08 ENCOUNTER — Ambulatory Visit (INDEPENDENT_AMBULATORY_CARE_PROVIDER_SITE_OTHER): Payer: Medicare Other | Admitting: Physician Assistant

## 2018-07-08 VITALS — BP 108/76 | HR 81 | Ht 69.0 in | Wt 298.0 lb

## 2018-07-08 DIAGNOSIS — E876 Hypokalemia: Secondary | ICD-10-CM

## 2018-07-08 DIAGNOSIS — R000 Tachycardia, unspecified: Secondary | ICD-10-CM

## 2018-07-08 DIAGNOSIS — I2510 Atherosclerotic heart disease of native coronary artery without angina pectoris: Secondary | ICD-10-CM | POA: Diagnosis not present

## 2018-07-08 DIAGNOSIS — I491 Atrial premature depolarization: Secondary | ICD-10-CM

## 2018-07-08 DIAGNOSIS — I5032 Chronic diastolic (congestive) heart failure: Secondary | ICD-10-CM | POA: Diagnosis not present

## 2018-07-08 NOTE — Patient Instructions (Addendum)
Medication Instructions:  Your physician recommends that you continue on your current medications as directed. Please refer to the Current Medication list given to you today. REMEMBER TO TAKE YOUR METOPROLOL  If you need a refill on your cardiac medications before your next appointment, please call your pharmacy.   Lab work: None ordered  If you have labs (blood work) drawn today and your tests are completely normal, you will receive your results only by: Marland Kitchen MyChart Message (if you have MyChart) OR . A paper copy in the mail If you have any lab test that is abnormal or we need to change your treatment, we will call you to review the results.  Testing/Procedures: None ordered  Follow-Up: At [facility], you and your health needs are our priority.  As part of our continuing mission to provide you with exceptional heart care, we have created designated Provider Care Teams.  These Care Teams include your primary Cardiologist (physician) and Advanced Practice Providers (APPs -  Physician Assistants and Nurse Practitioners) who all work together to provide you with the care you need, when you need it. You will need a follow up appointment on 10/25/2018 at 10:40 A.M.  Any Other Special Instructions Will Be Listed Below (If Applicable).

## 2018-07-14 ENCOUNTER — Telehealth: Payer: Self-pay | Admitting: Internal Medicine

## 2018-07-14 NOTE — Telephone Encounter (Signed)
Called [facility] and spoke with Adrianne checking to see when it was that the form had been faxed to our office and Adrianne stated they faxed it Monday, 6/1 but after Adrianne stated the fax number that she sent it to, found out that it was sent to our old office fax. I provide Adrianne with our new address, phone, and fax number and she stated she is going to resend the form again.  Routing to [employer] for her to be on the lookout for the form that needs to be signed by Dr. Annamaria Boots.

## 2018-07-15 NOTE — Telephone Encounter (Signed)
Received [facility] fax for pt's nebulizer equipment in CY's mail today 07/15/2018. Will place in CY's look-at bin and keep this encounter open to f/u on once the paperwork has been signed and faxed back.

## 2018-07-18 ENCOUNTER — Other Ambulatory Visit: Payer: Self-pay | Admitting: Physician Assistant

## 2018-07-19 NOTE — Telephone Encounter (Signed)
Forms have been signed by CY and sent to fax. Nothing further needed at this time.

## 2018-07-19 NOTE — Telephone Encounter (Signed)
Raquel Sarna, please advise if forms have been taken care of by CY. Thanks!

## 2018-07-20 ENCOUNTER — Telehealth: Payer: Self-pay

## 2018-07-20 NOTE — Telephone Encounter (Signed)
Pt called asking if he could get an order for compression stockings mailed to him.   Called and advised him that he has not been in the office in a year and was suppose to have followed up in June of last year for a visit. Appt made for him to be seen and we will take care of getting him new compression hose at that time.   York Cerise, CMA

## 2018-07-27 ENCOUNTER — Other Ambulatory Visit: Payer: Self-pay

## 2018-07-27 DIAGNOSIS — I83813 Varicose veins of bilateral lower extremities with pain: Secondary | ICD-10-CM

## 2018-07-30 ENCOUNTER — Telehealth: Payer: Self-pay

## 2018-07-30 NOTE — Telephone Encounter (Signed)
Pt called and said that he is having pain in his veins and wanted to know what he should do.   Called pt back and told him to use extra strength tylenol for pain. He said that they were feeling tight as well. Advised him that he should elevate his legs as much as possible and this will hopefully help with the swelling and tightness. He has an appt next week and was advised to keep this.   York Cerise, CMA

## 2018-08-02 ENCOUNTER — Encounter (HOSPITAL_COMMUNITY): Payer: Medicare Other

## 2018-08-02 ENCOUNTER — Ambulatory Visit: Payer: Medicare Other

## 2018-08-05 ENCOUNTER — Telehealth: Payer: Self-pay | Admitting: Internal Medicine

## 2018-08-05 ENCOUNTER — Telehealth (HOSPITAL_COMMUNITY): Payer: Self-pay | Admitting: Rehabilitation

## 2018-08-05 DIAGNOSIS — G4733 Obstructive sleep apnea (adult) (pediatric): Secondary | ICD-10-CM

## 2018-08-05 MED ORDER — HYDROCODONE-HOMATROPINE 5-1.5 MG/5ML PO SYRP: 5.0000 mL | ORAL_SOLUTION | Freq: Four times a day (QID) | ORAL | 0 refills | Status: DC | PRN

## 2018-08-05 NOTE — Telephone Encounter (Signed)
Hycodan refill e-sent  Please order HST for dx OSA, with instruction to call me for result and recommendation about 2 weeks after the study is done.

## 2018-08-05 NOTE — Telephone Encounter (Signed)
Called and spoke with pt letting him know that CY said he could try Delsym which is avail OTC. Pt verbalized understanding. Nothing further needed.

## 2018-08-05 NOTE — Telephone Encounter (Signed)

## 2018-08-05 NOTE — Telephone Encounter (Signed)
Suggest he first try otc Delsym cough syrup. It would likely be cheaper than co-pay with his insurance.

## 2018-08-05 NOTE — Telephone Encounter (Signed)
Called and spoke with pt. Pt wants to have another home sleep study performed so he can try to go back on CPAP. Pt is also requesting a refill of the hycodan cough syrup. Dr. Annamaria Boots, please advise if you are okay with [place] ordering another sleep study for pt and if you are fine refilling pt's Hycodan.  Med needs to be sent to [facility] on [address]. Thank you!  Allergies  Allergen Reactions  . Ibuprofen Other (See Comments)    Stomach pain    Current Outpatient Medications:  .  ACCU-CHEK AVIVA PLUS test strip, , Disp: , Rfl:  .  albuterol (PROVENTIL HFA;VENTOLIN HFA) 108 (90 Base) MCG/ACT inhaler, Inhale 2 puffs into the lungs every 6 (six) hours as needed for wheezing or shortness of breath., Disp: 1 Inhaler, Rfl: 12 .  albuterol (PROVENTIL) (2.5 MG/3ML) 0.083% nebulizer solution, Take 3 mLs (2.5 mg total) by nebulization every 6 (six) hours as needed for wheezing or shortness of breath., Disp: 75 mL, Rfl: prn .  aspirin EC 81 MG tablet, Take 81 mg by mouth daily. , Disp: , Rfl:  .  B-D ULTRAFINE III SHORT PEN 31G X 8 MM MISC, , Disp: , Rfl: 1 .  diclofenac sodium (VOLTAREN) 1 % GEL, APPLY 2 GRAMS TOPICALLY 4 TIMES DAILY TO KNEE, Disp: 100 g, Rfl: 1 .  eplerenone (INSPRA) 25 MG tablet, TAKE 1 TABLET BY MOUTH ONCE DAILY, Disp: 30 tablet, Rfl: 8 .  fluticasone (FLONASE) 50 MCG/ACT nasal spray, Place 1 spray into both nostrils daily., Disp: 16 g, Rfl: 12 .  HUMALOG KWIKPEN 200 UNIT/ML SOPN, Inject 10-30 Units into the skin 3 (three) times daily with meals. 20 units with smaller meals 30 units with regular meals and 10 units with snacks. Sliding Scale, Disp: , Rfl: 0 .  HYDROcodone-homatropine (HYCODAN) 5-1.5 MG/5ML syrup, Take 5 mLs by mouth every 6 (six) hours as needed for cough., Disp: 240 mL, Rfl: 0 .  ipratropium (ATROVENT) 0.02 % nebulizer solution, Take 2.5 mLs (0.5 mg total) by nebulization every 6 (six) hours as needed for wheezing or shortness of breath., Disp: 75 mL, Rfl: prn .   ipratropium (ATROVENT) 0.06 % nasal spray, Place 2 sprays into both nostrils 4 (four) times daily., Disp: 15 mL, Rfl: 0 .  Lancets (ACCU-CHEK MULTICLIX) lancets, , Disp: , Rfl:  .  LANTUS SOLOSTAR 100 UNIT/ML Solostar Pen, Inject 67 Units into the skin every morning. , Disp: , Rfl: 0 .  losartan (COZAAR) 50 MG tablet, Take 0.5 tablets (25 mg total) by mouth daily., Disp: 45 tablet, Rfl: 1 .  Magnesium Oxide 400 MG CAPS, Take 1 capsule (400 mg total) by mouth daily., Disp: 30 capsule, Rfl: 0 .  metoprolol tartrate (LOPRESSOR) 50 MG tablet, Take 1 tablet (50 mg total) by mouth 2 (two) times daily., Disp: 180 tablet, Rfl: 3 .  nitroGLYCERIN (NITROSTAT) 0.4 MG SL tablet, Place 1 tablet (0.4 mg total) under the tongue every 5 (five) minutes as needed for chest pain., Disp: 25 tablet, Rfl: 0 .  potassium chloride SA (K-DUR) 20 MEQ tablet, Take 2 tablets (40 mEq total) by mouth 3 (three) times daily., Disp: 240 tablet, Rfl: 11 .  rosuvastatin (CRESTOR) 20 MG tablet, Take 1 tablet (20 mg total) by mouth every evening., Disp: 30 tablet, Rfl: 11 .  torsemide (DEMADEX) 20 MG tablet, Take 1 tablet (20 mg total) by mouth 2 (two) times daily., Disp: 180 tablet, Rfl: 3

## 2018-08-05 NOTE — Telephone Encounter (Signed)
Called and spoke with Patient.  Patient stated Hycodan prescription was not covered by his insurance, and he could not afford it.  On Goodrx, Hycodan coupon at [employer] $22.04. Offered to print coupon for Patient.  Patient stated he needed something cheaper covered by insurance.    Message routed to Dr Annamaria Boots to advise on cheaper cough meds  Allergies  Allergen Reactions  . Ibuprofen Other (See Comments)    Stomach pain   Current Outpatient Medications on File Prior to Visit  Medication Sig Dispense Refill  . ACCU-CHEK AVIVA PLUS test strip     . albuterol (PROVENTIL HFA;VENTOLIN HFA) 108 (90 Base) MCG/ACT inhaler Inhale 2 puffs into the lungs every 6 (six) hours as needed for wheezing or shortness of breath. 1 Inhaler 12  . albuterol (PROVENTIL) (2.5 MG/3ML) 0.083% nebulizer solution Take 3 mLs (2.5 mg total) by nebulization every 6 (six) hours as needed for wheezing or shortness of breath. 75 mL prn  . aspirin EC 81 MG tablet Take 81 mg by mouth daily.     . B-D ULTRAFINE III SHORT PEN 31G X 8 MM MISC   1  . diclofenac sodium (VOLTAREN) 1 % GEL APPLY 2 GRAMS TOPICALLY 4 TIMES DAILY TO KNEE 100 g 1  . eplerenone (INSPRA) 25 MG tablet TAKE 1 TABLET BY MOUTH ONCE DAILY 30 tablet 8  . fluticasone (FLONASE) 50 MCG/ACT nasal spray Place 1 spray into both nostrils daily. 16 g 12  . HUMALOG KWIKPEN 200 UNIT/ML SOPN Inject 10-30 Units into the skin 3 (three) times daily with meals. 20 units with smaller meals 30 units with regular meals and 10 units with snacks. Sliding Scale  0  . HYDROcodone-homatropine (HYCODAN) 5-1.5 MG/5ML syrup Take 5 mLs by mouth every 6 (six) hours as needed for cough. 240 mL 0  . ipratropium (ATROVENT) 0.02 % nebulizer solution Take 2.5 mLs (0.5 mg total) by nebulization every 6 (six) hours as needed for wheezing or shortness of breath. 75 mL prn  . ipratropium (ATROVENT) 0.06 % nasal spray Place 2 sprays into both nostrils 4 (four) times daily. 15 mL 0  . Lancets (ACCU-CHEK  MULTICLIX) lancets     . LANTUS SOLOSTAR 100 UNIT/ML Solostar Pen Inject 67 Units into the skin every morning.   0  . losartan (COZAAR) 50 MG tablet Take 0.5 tablets (25 mg total) by mouth daily. 45 tablet 1  . Magnesium Oxide 400 MG CAPS Take 1 capsule (400 mg total) by mouth daily. 30 capsule 0  . metoprolol tartrate (LOPRESSOR) 50 MG tablet Take 1 tablet (50 mg total) by mouth 2 (two) times daily. 180 tablet 3  . nitroGLYCERIN (NITROSTAT) 0.4 MG SL tablet Place 1 tablet (0.4 mg total) under the tongue every 5 (five) minutes as needed for chest pain. 25 tablet 0  . potassium chloride SA (K-DUR) 20 MEQ tablet Take 2 tablets (40 mEq total) by mouth 3 (three) times daily. 240 tablet 11  . rosuvastatin (CRESTOR) 20 MG tablet Take 1 tablet (20 mg total) by mouth every evening. 30 tablet 11  . torsemide (DEMADEX) 20 MG tablet Take 1 tablet (20 mg total) by mouth 2 (two) times daily. 180 tablet 3  . [DISCONTINUED] simvastatin (ZOCOR) 80 MG tablet Take 80 mg by mouth daily.       No current facility-administered medications on file prior to visit.

## 2018-08-05 NOTE — Telephone Encounter (Signed)
Called and spoke with Patient.  Dr Annamaria Boots recommendations given. Understanding stated. HST order placed.  Nothing further at this time. Per CY- Hycodan refill e-sent  Please order HST for dx OSA, with instruction to call me for result and recommendation about 2 weeks after the study is done

## 2018-08-06 ENCOUNTER — Ambulatory Visit (HOSPITAL_COMMUNITY): Payer: Medicare Other

## 2018-08-06 ENCOUNTER — Encounter: Payer: Self-pay | Admitting: Family

## 2018-08-06 ENCOUNTER — Ambulatory Visit: Payer: Medicare Other

## 2018-08-06 ENCOUNTER — Other Ambulatory Visit: Payer: Self-pay

## 2018-08-06 ENCOUNTER — Ambulatory Visit (HOSPITAL_COMMUNITY)
Admission: RE | Admit: 2018-08-06 | Discharge: 2018-08-06 | Disposition: A | Discharge status: Home / Self Care | Payer: Medicare Other | Source: Ambulatory Visit | Attending: Vascular Surgery | Admitting: Vascular Surgery

## 2018-08-06 ENCOUNTER — Ambulatory Visit (INDEPENDENT_AMBULATORY_CARE_PROVIDER_SITE_OTHER): Payer: Medicare Other | Admitting: Family

## 2018-08-06 VITALS — BP 120/74 | HR 89 | Temp 97.9°F | Resp 14 | Ht 69.0 in | Wt 305.5 lb

## 2018-08-06 DIAGNOSIS — Z87891 Personal history of nicotine dependence: Secondary | ICD-10-CM

## 2018-08-06 DIAGNOSIS — E1165 Type 2 diabetes mellitus with hyperglycemia: Secondary | ICD-10-CM

## 2018-08-06 DIAGNOSIS — IMO0002 Reserved for concepts with insufficient information to code with codable children: Secondary | ICD-10-CM

## 2018-08-06 DIAGNOSIS — I83813 Varicose veins of bilateral lower extremities with pain: Secondary | ICD-10-CM

## 2018-08-06 DIAGNOSIS — I779 Disorder of arteries and arterioles, unspecified: Secondary | ICD-10-CM | POA: Diagnosis not present

## 2018-08-06 DIAGNOSIS — I872 Venous insufficiency (chronic) (peripheral): Secondary | ICD-10-CM | POA: Diagnosis not present

## 2018-08-06 DIAGNOSIS — E1151 Type 2 diabetes mellitus with diabetic peripheral angiopathy without gangrene: Secondary | ICD-10-CM

## 2018-08-06 NOTE — Patient Instructions (Addendum)
To decrease swelling in your feet and legs: Elevate feet above slightly bent knees, feet above heart, overnight and 3-4 times per day for 20 minutes.   To measure for knee high compression hose: Measure the length of calf (from the crease of the knee to the bottom of the heel), largest circumference of calf, and ankle circumference first thing in the morning before your legs have a chance to swell.  Take these 3 measurements with you to obtain 20-30 or 15-20 mm mercury graduated knee high compression hose.  Put the stockings on in the morning, remove at bedtime.     Chronic Venous Insufficiency Chronic venous insufficiency, also called venous stasis, is a condition that prevents blood from being pumped effectively through the veins in your legs. Blood may no longer be pumped effectively from the legs back to the heart. This condition can range from mild to severe. With proper treatment, you should be able to continue with an active life. What are the causes? Chronic venous insufficiency occurs when the vein walls become stretched, weakened, or damaged, or when valves within the vein are damaged. Some common causes of this include:  High blood pressure inside the veins (venous hypertension).  Increased blood pressure in the leg veins from long periods of sitting or standing.  A blood clot that blocks blood flow in a vein (deep vein thrombosis, DVT).  Inflammation of a vein (phlebitis) that causes a blood clot to form.  Tumors in the pelvis that cause blood to back up. What increases the risk? The following factors may make you more likely to develop this condition:  Having a family history of this condition.  Obesity.  Pregnancy.  Living without enough physical activity or exercise (sedentary lifestyle).  Smoking.  Having a job that requires long periods of standing or sitting in one place.  Being a certain age. Women in their 55s and 19s and men in their 55s are more  likely to develop this condition. What are the signs or symptoms? Symptoms of this condition include:  Veins that are enlarged, bulging, or twisted (varicose veins).  Skin breakdown or ulcers.  Reddened or discolored skin on the front of the leg.  Brown, smooth, tight, and painful skin just above the ankle, usually on the inside of the leg (lipodermatosclerosis).  Swelling. How is this diagnosed? This condition may be diagnosed based on:  Your medical history.  A physical exam.  Tests, such as: ? A procedure that creates an image of a blood vessel and nearby organs and provides information about blood flow through the blood vessel (duplex ultrasound). ? A procedure that tests blood flow (plethysmography). ? A procedure to look at the veins using X-ray and dye (venogram). How is this treated? The goals of treatment are to help you return to an active life and to minimize pain or disability. Treatment depends on the severity of your condition, and it may include:  Wearing compression stockings. These can help relieve symptoms and help prevent your condition from getting worse. However, they do not cure the condition.  Sclerotherapy. This is a procedure involving an injection of a material that "dissolves" damaged veins.  Surgery. This may involve: ? Removing a diseased vein (vein stripping). ? Cutting off blood flow through the vein (laser ablation surgery). ? Repairing a valve. Follow these instructions at home:      Wear compression stockings as told by your health care provider. These stockings help to prevent blood clots and reduce swelling  in your legs.  Take over-the-counter and prescription medicines only as told by your health care provider.  Stay active by exercising, walking, or doing different activities. Ask your health care provider what activities are safe for you and how much exercise you need.  Drink enough fluid to keep your urine clear or pale yellow.   Do not use any products that contain nicotine or tobacco, such as cigarettes and e-cigarettes. If you need help quitting, ask your health care provider.  Keep all follow-up visits as told by your health care provider. This is important. Contact a health care provider if:  You have redness, swelling, or more pain in the affected area.  You see a red streak or line that extends up or down from the affected area.  You have skin breakdown or a loss of skin in the affected area, even if the breakdown is small.  You get an injury in the affected area. Get help right away if:  You get an injury and an open wound in the affected area.  You have severe pain that does not get better with medicine.  You have sudden numbness or weakness in the foot or ankle below the affected area, or you have trouble moving your foot or ankle.  You have a fever and you have worse or persistent symptoms.  You have chest pain.  You have shortness of breath. Summary  Chronic venous insufficiency, also called venous stasis, is a condition that prevents blood from being pumped effectively through the veins in your legs.  Chronic venous insufficiency occurs when the vein walls become stretched, weakened, or damaged, or when valves within the vein are damaged.  Treatment for this condition depends on how severe your condition is, and it may involve wearing compression stockings or having a procedure.  Make sure you stay active by exercising, walking, or doing different activities. Ask your health care provider what activities are safe for you and how much exercise you need. This information is not intended to replace advice given to you by your health care provider. Make sure you discuss any questions you have with your health care provider. Document Released: 06/02/2006 Document Revised: 12/17/2015 Document Reviewed: 12/17/2015 Elsevier Interactive Patient Education  2019 [place].     Peripheral  Vascular Disease  Peripheral vascular disease (PVD) is a disease of the blood vessels that are not part of your heart and brain. A simple term for PVD is poor circulation. In most cases, PVD narrows the blood vessels that carry blood from your heart to the rest of your body. This can reduce the supply of blood to your arms, legs, and internal organs, like your stomach or kidneys. However, PVD most often affects a person's lower legs and feet. Without treatment, PVD tends to get worse. PVD can also lead to acute ischemic limb. This is when an arm or leg suddenly cannot get enough blood. This is a medical emergency. Follow these instructions at home: Lifestyle  Do not use any products that contain nicotine or tobacco, such as cigarettes and e-cigarettes. If you need help quitting, ask your doctor.  Lose weight if you are overweight. Or, stay at a healthy weight as told by your doctor.  Eat a diet that is low in fat and cholesterol. If you need help, ask your doctor.  Exercise regularly. Ask your doctor for activities that are right for you. General instructions  Take over-the-counter and prescription medicines only as told by your doctor.  Take  good care of your feet: ? Wear comfortable shoes that fit well. ? Check your feet often for any cuts or sores.  Keep all follow-up visits as told by your doctor This is important. Contact a doctor if:  You have cramps in your legs when you walk.  You have leg pain when you are at rest.  You have coldness in a leg or foot.  Your skin changes.  You are unable to get or have an erection (erectile dysfunction).  You have cuts or sores on your feet that do not heal. Get help right away if:  Your arm or leg turns cold, numb, and blue.  Your arms or legs become red, warm, swollen, painful, or numb.  You have chest pain.  You have trouble breathing.  You suddenly have weakness in your face, arm, or leg.  You become very confused or you  cannot speak.  You suddenly have a very bad headache.  You suddenly cannot see. Summary  Peripheral vascular disease (PVD) is a disease of the blood vessels.  A simple term for PVD is poor circulation. Without treatment, PVD tends to get worse.  Treatment may include exercise, low fat and low cholesterol diet, and quitting smoking. This information is not intended to replace advice given to you by your health care provider. Make sure you discuss any questions you have with your health care provider. Document Released: 04/23/2009 Document Revised: 03/06/2016 Document Reviewed: 03/06/2016 Elsevier Interactive Patient Education  2019 [employer].

## 2018-08-06 NOTE — Progress Notes (Signed)
[facility] [place]   CC: Follow up peripheral artery occlusive disease and chronic venous insufficiency   History of Present Illness [name] [name] is a 63 y.o. male whom Dr. Oneida Alar and Arlee Muslim PA-C saw once at initial evaluation in March 2019 for chronic venous insufficiency and varicose veins. He was also found to have left calf claudication.  He was complaining of pain and swelling in bilateral lower extremities, left greater than right.  He had tried compression for several days in a row which did improve the symptoms as well as the swelling however he was no longer wearing these compression socks on a daily basis.  At that visit he denied any history of venous ulcerations or DVT.  Surgical history significant for coronary artery bypass grafting with harvested right greater saphenous vein.  He is on daily torsemide prescribed by cardiology.  At his 04-13-17 visit with Dr. Oneida Alar, patient had evidence of superficial reflux and deep vein reflux in the left lower extremity.  His vein was not large enough at that time to consider laser ablation.  Mainstay of therapy is going to be compression therapy and Dr. Oneida Alar discussed compliance of this with the patient.   Pt was to return in 3 months to see if his symptoms had improved.  Fortunately he had no skin breakdown at that time  We will perform ABIs when he returns in 3 months since in the past he had an element of peripheral arterial disease as well.  Patient was told he could elevate the legs and use extra strength Tylenol intermittently for inflammation and aching. Dr. Oneida Alar told him he did not need narcotic pain medication for his symptoms at that time.  Patient also complains of claudication of left lower extremity.  He notices the most when walking uphill.  If he stops for several minutes he can continue the same distance before left calf pain returns.  He denies rest pain or active tissue ischemia of bilateral lower  extremities.    He states he had a fracture of his right proximal phalangeal bones in 2000; he has occasional pain in that foot.   He mows his grass with a push mower, and states he is able to complete this. He admits to drinking lots of sugared colas.   Diabetic: Yes, 9.1 A1C on 06-23-18 from his endocrinologist office, Dr. Buddy Duty, uncontrolled Tobacco use: former smoker, quit in 2006, smoked 1.5 ppd x 30 years  Pt meds include: Statin :Yes Betablocker: Yes ASA: Yes Other anticoagulants/antiplatelets: no  Past Medical History:  Diagnosis Date  . Acute myocardial infarction, unspecified site, episode of care unspecified 2005  . Acute pancreatitis   . CAD (coronary artery disease)    a. CABG in 2005 w LIMA to LAD, left radial to second circumflex marginal, saphenous vein graft to PDA, saphenous vein graft to lateral subbrach of ramus intermediate, and sequential saphenous vein graft to the medial subbranch of ramus intermediate.  . Chronic diastolic CHF (congestive heart failure) ([place])   . Cirrhosis of liver without mention of alcohol   . CKD (chronic kidney disease), stage II   . Complications affecting other specified body systems, hypertension   . Ectopic atrial rhythm   . Essential hypertension   . Hyperlipidemia   . Hypokalemia    a. intermitttent noncompliance with potassium supplement.  . Hypomagnesemia   . Morbid obesity ([place])   . Neuropathy   . Other and unspecified hyperlipidemia   . Proteinuria   .  RBBB   . Sleep apnea   . Type II or unspecified type diabetes mellitus without mention of complication, not stated as uncontrolled   . Varicose veins of both lower extremities     Social History Social History   Tobacco Use  . Smoking status: Former Smoker    Packs/day: 1.50    Years: 30.00    Pack years: 45.00    Quit date: 10/20/2004    Years since quitting: 13.8  . Smokeless tobacco: Never Used  Substance Use Topics  . Alcohol use: Yes    Comment: occasional   . Drug use: No    Family History Family History  Problem Relation Age of Onset  . Breast cancer Mother   . Prostate cancer Father   . Coronary artery disease Father        underwent CABG    Past Surgical History:  Procedure Laterality Date  . CORONARY ARTERY BYPASS GRAFT    . ENT surgery      Allergies  Allergen Reactions  . Ibuprofen Other (See Comments)    Stomach pain    Current Outpatient Medications  Medication Sig Dispense Refill  . ACCU-CHEK AVIVA PLUS test strip     . albuterol (PROVENTIL HFA;VENTOLIN HFA) 108 (90 Base) MCG/ACT inhaler Inhale 2 puffs into the lungs every 6 (six) hours as needed for wheezing or shortness of breath. 1 Inhaler 12  . albuterol (PROVENTIL) (2.5 MG/3ML) 0.083% nebulizer solution Take 3 mLs (2.5 mg total) by nebulization every 6 (six) hours as needed for wheezing or shortness of breath. 75 mL prn  . aspirin EC 81 MG tablet Take 81 mg by mouth daily.     . B-D ULTRAFINE III SHORT PEN 31G X 8 MM MISC   1  . diclofenac sodium (VOLTAREN) 1 % GEL APPLY 2 GRAMS TOPICALLY 4 TIMES DAILY TO KNEE 100 g 1  . eplerenone (INSPRA) 25 MG tablet TAKE 1 TABLET BY MOUTH ONCE DAILY 30 tablet 8  . fluticasone (FLONASE) 50 MCG/ACT nasal spray Place 1 spray into both nostrils daily. 16 g 12  . HUMALOG KWIKPEN 200 UNIT/ML SOPN Inject 10-30 Units into the skin 3 (three) times daily with meals. 20 units with smaller meals 30 units with regular meals and 10 units with snacks. Sliding Scale  0  . HYDROcodone-homatropine (HYCODAN) 5-1.5 MG/5ML syrup Take 5 mLs by mouth every 6 (six) hours as needed for cough. 240 mL 0  . ipratropium (ATROVENT) 0.02 % nebulizer solution Take 2.5 mLs (0.5 mg total) by nebulization every 6 (six) hours as needed for wheezing or shortness of breath. 75 mL prn  . ipratropium (ATROVENT) 0.06 % nasal spray Place 2 sprays into both nostrils 4 (four) times daily. 15 mL 0  . Lancets (ACCU-CHEK MULTICLIX) lancets     . LANTUS SOLOSTAR 100 UNIT/ML  Solostar Pen Inject 67 Units into the skin every morning.   0  . losartan (COZAAR) 50 MG tablet Take 0.5 tablets (25 mg total) by mouth daily. 45 tablet 1  . Magnesium Oxide 400 MG CAPS Take 1 capsule (400 mg total) by mouth daily. 30 capsule 0  . metoprolol tartrate (LOPRESSOR) 50 MG tablet Take 1 tablet (50 mg total) by mouth 2 (two) times daily. 180 tablet 3  . nitroGLYCERIN (NITROSTAT) 0.4 MG SL tablet Place 1 tablet (0.4 mg total) under the tongue every 5 (five) minutes as needed for chest pain. 25 tablet 0  . potassium chloride SA (K-DUR) 20 MEQ tablet  Take 2 tablets (40 mEq total) by mouth 3 (three) times daily. 240 tablet 11  . rosuvastatin (CRESTOR) 20 MG tablet Take 1 tablet (20 mg total) by mouth every evening. 30 tablet 11  . torsemide (DEMADEX) 20 MG tablet Take 1 tablet (20 mg total) by mouth 2 (two) times daily. 180 tablet 3   No current facility-administered medications for this visit.     ROS: See HPI for pertinent positives and negatives.   Physical Examination  Vitals:   08/06/18 0952  BP: 120/74  Pulse: 89  Resp: 14  Temp: 97.9 F (36.6 C)  TempSrc: Temporal  SpO2: 99%  Weight: (!) 305 lb 8 oz (138.6 kg)  Height: 5\' 9"  (1.753 m)   Body mass index is 45.11 kg/m.  General: A&O x 3, WDWN, morbidly obese male. Gait: slow, steady, using cane HENT: Large neck Eyes: Pupils equal Pulmonary: Respirations are non labored, distant breath sounds in all fields, no rales, rhonchi, or wheezes Cardiac: regular rhythm, no detected murmur.         Carotid Bruits Right Left   Negative Negative   Radial pulses: 2+ right, absent left (radial artery harvested for CABG)   Adominal aortic pulse is not palpable                         VASCULAR EXAM: Extremities without ischemic changes, without Gangrene; without open wounds. 1-2+ non pitting edema in both lower legs and feet, 1+ pitting edema on both feet. Long thick toenails. Overgrowth of dry skin at medial aspect right  foot.                                                                                                          LE Pulses Right Left       FEMORAL  not palpable, morbidly obese  not palpable        POPLITEAL  not palpable   not palpable       POSTERIOR TIBIAL  not palpable   not palpable        DORSALIS PEDIS      ANTERIOR TIBIAL not palpable  not palpable    Abdomen: softly distended, obese, NT, no palpable masses. Skin: no rashes, no cellulitis, no ulcers noted. See Extremities. Musculoskeletal: no muscle wasting or atrophy.  Neurologic: A&O X 3; appropriate affect, Sensation is diminished to touch lat left 3-5 fingers and lateral aspect left hand, and in feet; MOTOR FUNCTION:  moving all extremities equally, motor strength 5/5 throughout. Speech is fluent/normal. CN 2-12 intact. Psychiatric: Thought content is normal, mood appropriate for clinical situation. Loquacious.     ASSESSMENT: SHIVANK PINEDO is a 63 y.o. male who presents for follow up of peripheral artery occlusive disease and chronic venous insufficiency.   PAOD: ABI's today show mild disease bilaterally with all monophasic waveforms.  Compared to 2016 ABI's from 2016 at [facility], this is a decline in the right, stable in the left.  I advised him to gradually increase his walking distance and  time, in a safe environment. Daily seated leg exercises discussed and demonstrated.   Chronic venous insufficiency: 1-2+ non pitting edema in both lower legs and feet, 1+ pitting edema on both feet. No venous ulcers.  20-30 mm Hg knee high compression hose measured for and given to pt. Elevation of his legs discussed, see Patient Instructions.   His arteriosclerotic risk factors include uncontrolled DM, 30 year hx of smoking (quit in 2006), CAD, CKD, OSA, and morbid obesity.   DATA  ABI (Date: 08/06/2018): ABI Findings: +---------+------------------+-----+----------+--------+ Right    Rt Pressure (mmHg)IndexWaveform   Comment  +---------+------------------+-----+----------+--------+ Brachial 117                                       +---------+------------------+-----+----------+--------+ ATA      92                0.79 monophasic         +---------+------------------+-----+----------+--------+ PTA      100               0.85 monophasic         +---------+------------------+-----+----------+--------+ Great Toe91                0.78                    +---------+------------------+-----+----------+--------+  +---------+------------------+-----+----------+-------+ Left     Lt Pressure (mmHg)IndexWaveform  Comment +---------+------------------+-----+----------+-------+ Brachial 116                                      +---------+------------------+-----+----------+-------+ ATA      103               0.88 monophasic        +---------+------------------+-----+----------+-------+ PTA      92                0.79 monophasic        +---------+------------------+-----+----------+-------+ Great Toe94                0.80                   +---------+------------------+-----+----------+-------+  +-------+-----------+-----------+------------+------------+ ABI/TBIToday's ABIToday's TBIPrevious ABIPrevious TBI +-------+-----------+-----------+------------+------------+ Right  0.85       0.78       1.1         0.86         +-------+-----------+-----------+------------+------------+ Left   0.88       0.8        0.86        0.58         +-------+-----------+-----------+------------+------------+  Previous ABI 08/22/14 at [facility] location.   Summary: Right: Resting right ankle-brachial index indicates mild right lower extremity arterial disease. The right toe-brachial index is normal. RT great toe pressure = 91 mmHg.  Left: Resting left ankle-brachial index indicates mild left lower extremity arterial disease. The left toe-brachial  index is normal. LT Great toe pressure = 94 mmHg.    PLAN:  Based on the patient's vascular studies and examination, pt will return to clinic in 1 year with ABI's. I advised him to notify us if he develops concerns re the circulation in his feet or legs.   I discussed in depth with the patient the nature of atherosclerosis, and emphasized the importance of  maximal medical management including strict control of blood pressure, blood glucose, and lipid levels, obtaining regular exercise, and continued cessation of smoking.  The patient is aware that without maximal medical management the underlying atherosclerotic disease process will progress, limiting the benefit of any interventions.  The patient was given information about PAD including signs, symptoms, treatment, what symptoms should prompt the patient to seek immediate medical care, and risk reduction measures to take.  Clemon Chambers, RN, MSN, FNP-C [facility] Phone: [phone]  Clinic MD: Donzetta Matters  08/06/18 9:58 AM

## 2018-09-03 ENCOUNTER — Other Ambulatory Visit: Payer: Self-pay | Admitting: Physician Assistant

## 2018-09-03 DIAGNOSIS — I10 Essential (primary) hypertension: Secondary | ICD-10-CM

## 2018-09-05 ENCOUNTER — Other Ambulatory Visit: Payer: Self-pay | Admitting: Cardiology

## 2018-09-05 ENCOUNTER — Other Ambulatory Visit: Payer: Self-pay | Admitting: Physician Assistant

## 2018-09-08 ENCOUNTER — Other Ambulatory Visit: Payer: Self-pay

## 2018-09-08 ENCOUNTER — Ambulatory Visit: Payer: Medicare Other

## 2018-09-08 ENCOUNTER — Telehealth: Payer: Self-pay | Admitting: Internal Medicine

## 2018-09-08 DIAGNOSIS — G4733 Obstructive sleep apnea (adult) (pediatric): Secondary | ICD-10-CM

## 2018-09-08 MED ORDER — HYDROCODONE-HOMATROPINE 5-1.5 MG/5ML PO SYRP: 5.0000 mL | ORAL_SOLUTION | Freq: Four times a day (QID) | ORAL | 0 refills | Status: DC | PRN

## 2018-09-08 NOTE — Telephone Encounter (Signed)
Called and spoke with pt letting him know the Rx was sent to pharmacy. Pt verbalized understanding. Nothing further needed.

## 2018-09-08 NOTE — Telephone Encounter (Signed)
Script e-sent for 120 ml Hycodan as requested

## 2018-09-08 NOTE — Telephone Encounter (Signed)
Called and spoke with pt. Pt is requesting a refill of the Hycodan cough syrup. Pt said due to the cost of the med, on 6/25, he had the pharmacy only fill 180ml of the cough syrup instead of the entire 255ml which was prescribed.  Called pt's pharmacy and confirmed with them that pt only had 134ml filled 6/25.  Pt is requesting to receive Rx of the Hycodan with 132ml for him to take as needed for cough. Dr. Annamaria Boots, please advise on this for pt. Thanks!

## 2018-09-10 DIAGNOSIS — G4733 Obstructive sleep apnea (adult) (pediatric): Secondary | ICD-10-CM

## 2018-09-15 ENCOUNTER — Ambulatory Visit (INDEPENDENT_AMBULATORY_CARE_PROVIDER_SITE_OTHER): Payer: Medicare Other | Admitting: Podiatry

## 2018-09-15 ENCOUNTER — Encounter: Payer: Self-pay | Admitting: Podiatry

## 2018-09-15 ENCOUNTER — Other Ambulatory Visit: Payer: Self-pay

## 2018-09-15 DIAGNOSIS — B351 Tinea unguium: Secondary | ICD-10-CM | POA: Insufficient documentation

## 2018-09-15 DIAGNOSIS — L84 Corns and callosities: Secondary | ICD-10-CM | POA: Diagnosis not present

## 2018-09-15 DIAGNOSIS — M79675 Pain in left toe(s): Secondary | ICD-10-CM

## 2018-09-15 DIAGNOSIS — E1159 Type 2 diabetes mellitus with other circulatory complications: Secondary | ICD-10-CM | POA: Diagnosis not present

## 2018-09-15 DIAGNOSIS — E1142 Type 2 diabetes mellitus with diabetic polyneuropathy: Secondary | ICD-10-CM

## 2018-09-15 DIAGNOSIS — I739 Peripheral vascular disease, unspecified: Secondary | ICD-10-CM

## 2018-09-15 DIAGNOSIS — E1140 Type 2 diabetes mellitus with diabetic neuropathy, unspecified: Secondary | ICD-10-CM | POA: Insufficient documentation

## 2018-09-15 DIAGNOSIS — M79674 Pain in right toe(s): Secondary | ICD-10-CM

## 2018-09-15 NOTE — Progress Notes (Signed)
This patient presents to the office with chief complaint of long thick nails and diabetic feet.  This patient  says there  is  no pain and discomfort in his  feet.  This patient says there are long thick painful nails.  These nails are painful walking and wearing shoes.  Patient has no history of infection or drainage from both feet.  Patient is unable to  self treat his own nails .  Patient has severe callus right foot with no pain.  This patient presents  to the office today for treatment of the  long nails and a foot evaluation due to history of  Diabetes.  He has been diagnosed with PVD and neuropathy.  General Appearance  Alert, conversant and in no acute stress.  Vascular  Dorsalis pedis and posterior tibial  pulses are not  palpable  Bilaterally due to severe swelling  B/L.Marland Kitchen  Capillary return is within normal limits  bilaterally. Temperature is within normal limits  bilaterally.  Neurologic  Senn-Weinstein monofilament wire test diminished   bilaterally. Muscle power within normal limits bilaterally.  Nails Thick disfigured discolored nails with subungual debris  from hallux to fifth toes bilaterally. No evidence of bacterial infection or drainage bilaterally.  Orthopedic  No limitations of motion of motion feet .  No crepitus or effusions noted.  No bony pathology or digital deformities noted.  Skin  normotropic skin with no porokeratosis noted bilaterally.  No signs of infections or ulcers noted.  Thickened callus on medial aspect right foot.   Onychomycosis  Diabetes with angiopathy and neuropathy.  IE  Debride nails x 10.  A diabetic foot exam was performed and there is  evidence of both  vascular or neurologic pathology.   RTC 3 months..  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  Patient to make an appointment with Liliane Channel.  RTC 10 weeks.  Debride callus  right foot.   Gardiner Barefoot DPM

## 2018-10-04 ENCOUNTER — Other Ambulatory Visit: Payer: Medicare Other | Admitting: Orthotics

## 2018-10-05 ENCOUNTER — Ambulatory Visit (INDEPENDENT_AMBULATORY_CARE_PROVIDER_SITE_OTHER): Payer: Medicare Other | Admitting: Family Medicine

## 2018-10-05 ENCOUNTER — Encounter: Payer: Self-pay | Admitting: Family Medicine

## 2018-10-05 ENCOUNTER — Other Ambulatory Visit: Payer: Self-pay

## 2018-10-05 VITALS — Ht 69.0 in | Wt 301.0 lb

## 2018-10-05 DIAGNOSIS — Z794 Long term (current) use of insulin: Secondary | ICD-10-CM

## 2018-10-05 DIAGNOSIS — K859 Acute pancreatitis without necrosis or infection, unspecified: Secondary | ICD-10-CM

## 2018-10-05 DIAGNOSIS — I5032 Chronic diastolic (congestive) heart failure: Secondary | ICD-10-CM | POA: Diagnosis not present

## 2018-10-05 DIAGNOSIS — K7030 Alcoholic cirrhosis of liver without ascites: Secondary | ICD-10-CM

## 2018-10-05 DIAGNOSIS — I10 Essential (primary) hypertension: Secondary | ICD-10-CM | POA: Diagnosis not present

## 2018-10-05 DIAGNOSIS — E1169 Type 2 diabetes mellitus with other specified complication: Secondary | ICD-10-CM

## 2018-10-05 DIAGNOSIS — IMO0001 Reserved for inherently not codable concepts without codable children: Secondary | ICD-10-CM

## 2018-10-05 DIAGNOSIS — E1142 Type 2 diabetes mellitus with diabetic polyneuropathy: Secondary | ICD-10-CM

## 2018-10-05 DIAGNOSIS — E1149 Type 2 diabetes mellitus with other diabetic neurological complication: Secondary | ICD-10-CM

## 2018-10-05 DIAGNOSIS — R600 Localized edema: Secondary | ICD-10-CM

## 2018-10-05 DIAGNOSIS — E782 Mixed hyperlipidemia: Secondary | ICD-10-CM

## 2018-10-05 DIAGNOSIS — E1159 Type 2 diabetes mellitus with other circulatory complications: Secondary | ICD-10-CM

## 2018-10-05 NOTE — Progress Notes (Signed)
Virtual Visit via Video   Due to the COVID-19 pandemic, this visit was completed with telemedicine (audio/video) technology to reduce patient and provider exposure as well as to preserve personal protective equipment.   I connected with [name] by a video enabled telemedicine application and verified that I am speaking with the correct person using two identifiers. Location patient: Home Location provider: [facility], Office Persons participating in the virtual visit: RAWLAND SERINO, Briscoe Deutscher, DO   I discussed the limitations of evaluation and management by telemedicine and the availability of in person appointments. The patient expressed understanding and agreed to proceed.  Care Team   Patient Care Team: Briscoe Deutscher, DO as PCP - General (Family Medicine) [name], MD as PCP - Cardiology (Cardiology) Delrae Rend, MD as Consulting Physician (Endocrinology) Gardiner Barefoot, DPM as Consulting Physician (Podiatry) Deneise Lever, MD as Consulting Physician (Pulmonary Disease) [name], [name], NP as Nurse Practitioner (Vascular Surgery) [facility] as Consulting Physician  Subjective:   HPI: See Assessment and Plan section for Problem Based Charting of issues discussed today.   Patient Active Problem List   Diagnosis Date Noted  . Bilateral lower extremity edema 10/27/2018  . Type 2 diabetes mellitus with other specified complication ([place]), followed by Dr. Buddy Duty 10/14/2018  . Pain due to onychomycosis of toenails of both feet 09/15/2018  . Type 2 diabetes mellitus with vascular disease ([place]) 09/15/2018  . Chronic heart failure with preserved ejection fraction ([place]) 08/29/2017  . Ectopic atrial rhythm 05/10/2017  . Varicose vein of leg 04/13/2017  . Peripheral vascular disease ([place]) 04/13/2017  . Diabetes mellitus with polyneuropathy ([place]) 03/20/2017  . Primary osteoarthritis of both knees 01/21/2017  . Coronary artery disease  involving native coronary artery of native heart without angina pectoris 10/29/2016  . Seasonal and perennial allergic rhinitis 07/17/2016  . Ulnar neuritis, left 03/27/2016  . Osteoarthritis of spine with radiculopathy, cervical region 03/27/2016  . Diastolic CHF, chronic ([place]) 03/24/2016  . Edema 08/12/2010  . PVD (peripheral vascular disease) ([place]) 04/30/2009  . Morbid obesity ([place]) 12/26/2008  . HLD (hyperlipidemia) 01/04/2007  . Acute MI ([place]) 01/04/2007  . Bronchitis 01/04/2007  . Hepatic cirrhosis ([place]) 01/04/2007  . Pancreatitis 01/04/2007  . Obstructive sleep apnea 01/04/2007  . HTN (hypertension) 01/04/2007    Social History   Tobacco Use  . Smoking status: Former Smoker    Packs/day: 1.50    Years: 30.00    Pack years: 45.00    Quit date: 10/20/2004    Years since quitting: 14.0  . Smokeless tobacco: Never Used  Substance Use Topics  . Alcohol use: Yes    Comment: occasional   Current Outpatient Medications:  .  ACCU-CHEK AVIVA PLUS test strip, , Disp: , Rfl:  .  albuterol (PROVENTIL HFA;VENTOLIN HFA) 108 (90 Base) MCG/ACT inhaler, Inhale 2 puffs into the lungs every 6 (six) hours as needed for wheezing or shortness of breath. (Patient not taking: Reported on 09/15/2018), Disp: 1 Inhaler, Rfl: 12 .  albuterol (PROVENTIL) (2.5 MG/3ML) 0.083% nebulizer solution, Take 3 mLs (2.5 mg total) by nebulization every 6 (six) hours as needed for wheezing or shortness of breath. (Patient not taking: Reported on 09/15/2018), Disp: 75 mL, Rfl: prn .  aspirin EC 81 MG tablet, Take 81 mg by mouth daily. , Disp: , Rfl:  .  B-D ULTRAFINE III SHORT PEN 31G X 8 MM MISC, , Disp: , Rfl: 1 .  diclofenac sodium (VOLTAREN) 1 %  GEL, APPLY 2 GRAMS TOPICALLY 4 TIMES DAILY TO KNEE (Patient not taking: Reported on 09/15/2018), Disp: 100 g, Rfl: 1 .  eplerenone (INSPRA) 25 MG tablet, TAKE 1 TABLET BY MOUTH ONCE DAILY (Patient not taking: Reported on 09/15/2018), Disp: 30 tablet, Rfl: 2 .  fluticasone  (FLONASE) 50 MCG/ACT nasal spray, Place 1 spray into both nostrils daily. (Patient not taking: Reported on 09/15/2018), Disp: 16 g, Rfl: 12 .  HUMALOG KWIKPEN 200 UNIT/ML SOPN, Inject 10-30 Units into the skin 3 (three) times daily with meals. 20 units with smaller meals 30 units with regular meals and 10 units with snacks. Sliding Scale, Disp: , Rfl: 0 .  HYDROcodone-homatropine (HYCODAN) 5-1.5 MG/5ML syrup, Take 5 mLs by mouth every 6 (six) hours as needed for cough. (Patient not taking: Reported on 09/15/2018), Disp: 120 mL, Rfl: 0 .  ipratropium (ATROVENT) 0.02 % nebulizer solution, Take 2.5 mLs (0.5 mg total) by nebulization every 6 (six) hours as needed for wheezing or shortness of breath. (Patient not taking: Reported on 09/15/2018), Disp: 75 mL, Rfl: prn .  ipratropium (ATROVENT) 0.06 % nasal spray, Place 2 sprays into both nostrils 4 (four) times daily. (Patient not taking: Reported on 09/15/2018), Disp: 15 mL, Rfl: 0 .  Lancets (ACCU-CHEK MULTICLIX) lancets, , Disp: , Rfl:  .  LANTUS SOLOSTAR 100 UNIT/ML Solostar Pen, Inject 67 Units into the skin every morning. , Disp: , Rfl: 0 .  losartan (COZAAR) 50 MG tablet, TAKE 1/2 TABLET BY MOUTH EVERY DAY (Patient not taking: Reported on 09/15/2018), Disp: 45 tablet, Rfl: 1 .  Magnesium Oxide 400 (240 Mg) MG TABS, TAKE 1 TABLET(400 MG) BY MOUTH DAILY (Patient not taking: Reported on 09/15/2018), Disp: 90 tablet, Rfl: 0 .  Magnesium Oxide 400 MG CAPS, Take 1 capsule (400 mg total) by mouth daily. (Patient not taking: Reported on 09/15/2018), Disp: 30 capsule, Rfl: 0 .  metoprolol tartrate (LOPRESSOR) 50 MG tablet, Take 1 tablet (50 mg total) by mouth 2 (two) times daily., Disp: 180 tablet, Rfl: 3 .  nitroGLYCERIN (NITROSTAT) 0.4 MG SL tablet, Place 1 tablet (0.4 mg total) under the tongue every 5 (five) minutes as needed for chest pain. (Patient not taking: Reported on 09/15/2018), Disp: 25 tablet, Rfl: 0 .  potassium chloride SA (K-DUR) 20 MEQ tablet, Take 2 tablets  (40 mEq total) by mouth 3 (three) times daily. (Patient not taking: Reported on 09/15/2018), Disp: 240 tablet, Rfl: 11 .  rosuvastatin (CRESTOR) 20 MG tablet, Take 1 tablet (20 mg total) by mouth every evening. (Patient not taking: Reported on 09/15/2018), Disp: 30 tablet, Rfl: 11 .  torsemide (DEMADEX) 20 MG tablet, Take 1 tablet (20 mg total) by mouth 2 (two) times daily. (Patient not taking: Reported on 09/15/2018), Disp: 180 tablet, Rfl: 3  Allergies  Allergen Reactions  . Ibuprofen Other (See Comments)    Stomach pain   Objective:   VITALS: Per patient if applicable, see vitals. GENERAL: Alert, appears well and in no acute distress. HEENT: Atraumatic, conjunctiva clear, no obvious abnormalities on inspection of external nose and ears. NECK: Normal movements of the head and neck. CARDIOPULMONARY: No increased WOB. Speaking in clear sentences. I:E ratio WNL.  MS: Moves all visible extremities without noticeable abnormality. PSYCH: Pleasant and cooperative, well-groomed. Speech normal rate and rhythm. Affect is appropriate. Insight and judgement are appropriate. Attention is focused, linear, and appropriate.  NEURO: CN grossly intact. Oriented as arrived to appointment on time with no prompting. Moves both UE equally.  SKIN:  No obvious lesions, wounds, erythema, or cyanosis noted on face or hands.  Depression screen [facility] 2/9 10/05/2018 03/24/2016  Decreased Interest 0 0  Down, Depressed, Hopeless 0 0  PHQ - 2 Score 0 0  Altered sleeping 0 -  Tired, decreased energy 0 -  Change in appetite 0 -  Feeling bad or failure about yourself  0 -  Trouble concentrating 0 -  Moving slowly or fidgety/restless 0 -  Suicidal thoughts 0 -  PHQ-9 Score 0 -  Difficult doing work/chores Not difficult at all -   Assessment and Plan:   HTN (hypertension) Followed by Cardiology. No concerns today. Tolerating medications without side effects.   Type 2 diabetes mellitus with vascular disease ([place]) Followed  by Endocrinology. Due for labs.   Morbid obesity ([place]) Wt Readings from Last 3 Encounters:  10/25/18 (!) 307 lb 6.4 oz (139.4 kg)  10/13/18 (!) 305 lb (138.3 kg)  10/05/18 (!) 301 lb (136.5 kg)   The patient is asked to make an attempt to improve diet and exercise patterns to aid in medical management of this problem.   HLD (hyperlipidemia) Is the patient taking medications without problems? Yes. Does the patient complain of muscle aches?  No. Trying to exercise on a regular basis? No. Compliant with diet? No.  Lab Results  Component Value Date   CHOL 135 10/07/2018   HDL 45.30 10/07/2018   LDLCALC 80 01/08/2010   LDLDIRECT 71.0 10/07/2018   TRIG 232.0 (H) 10/07/2018   CHOLHDL 3 10/07/2018   Lab Results  Component Value Date   ALT 11 10/07/2018   AST 15 10/07/2018   ALKPHOS 77 10/07/2018   BILITOT 0.5 10/07/2018      Chronic heart failure with preserved ejection fraction ([place]) Echo 05/08/17  Study Conclusions  - Left ventricle: The cavity size was normal. Wall thickness was   normal. Systolic function was normal. The estimated ejection   fraction was in the range of 50% to 55%. Left ventricular   diastolic function parameters were normal. - Mitral valve: There was mild regurgitation. - Right ventricle: The cavity size was mildly dilated. - Right atrium: The atrium was moderately dilated.  Orders Placed This Encounter  Procedures  . DG Chest 2 View  . Brain natriuretic peptide  . CBC with Differential/Platelet  . Comprehensive metabolic panel  . Hemoglobin A1c  . Lipid panel  . Magnesium  . TSH  . POCT INR   . COVID-19 Education: The signs and symptoms of COVID-19 were discussed with the patient and how to seek care for testing if needed. The importance of social distancing was discussed today. . Reviewed expectations re: course of current medical issues. . Discussed self-management of symptoms. . Outlined signs and symptoms indicating need for more acute  intervention. . Patient verbalized understanding and all questions were answered. Marland Kitchen Health Maintenance issues including appropriate healthy diet, exercise, and smoking avoidance were discussed with patient. . See orders for this visit as documented in the electronic medical record.  Briscoe Deutscher, DO  Records requested if needed. Time spent: 25 minutes, of which >50% was spent in obtaining information about his symptoms, reviewing his previous labs, evaluations, and treatments, counseling him about his condition (please see the discussed topics above), and developing a plan to further investigate it; he had a number of questions which I addressed.

## 2018-10-07 ENCOUNTER — Ambulatory Visit (INDEPENDENT_AMBULATORY_CARE_PROVIDER_SITE_OTHER): Payer: Medicare Other

## 2018-10-07 ENCOUNTER — Other Ambulatory Visit: Payer: Self-pay

## 2018-10-07 ENCOUNTER — Other Ambulatory Visit (INDEPENDENT_AMBULATORY_CARE_PROVIDER_SITE_OTHER): Payer: Medicare Other

## 2018-10-07 DIAGNOSIS — I5032 Chronic diastolic (congestive) heart failure: Secondary | ICD-10-CM

## 2018-10-07 DIAGNOSIS — I10 Essential (primary) hypertension: Secondary | ICD-10-CM

## 2018-10-07 DIAGNOSIS — E1149 Type 2 diabetes mellitus with other diabetic neurological complication: Secondary | ICD-10-CM

## 2018-10-07 DIAGNOSIS — R600 Localized edema: Secondary | ICD-10-CM

## 2018-10-07 DIAGNOSIS — IMO0001 Reserved for inherently not codable concepts without codable children: Secondary | ICD-10-CM

## 2018-10-07 DIAGNOSIS — E782 Mixed hyperlipidemia: Secondary | ICD-10-CM

## 2018-10-07 DIAGNOSIS — K7030 Alcoholic cirrhosis of liver without ascites: Secondary | ICD-10-CM

## 2018-10-07 DIAGNOSIS — Z794 Long term (current) use of insulin: Secondary | ICD-10-CM

## 2018-10-07 LAB — CBC WITH DIFFERENTIAL/PLATELET
Basophils Absolute: 0 10*3/uL (ref 0.0–0.1)
Basophils Relative: 0.6 % (ref 0.0–3.0)
Eosinophils Absolute: 0.2 10*3/uL (ref 0.0–0.7)
Eosinophils Relative: 2.6 % (ref 0.0–5.0)
HCT: 42 % (ref 39.0–52.0)
Hemoglobin: 14.1 g/dL (ref 13.0–17.0)
Lymphocytes Relative: 48.1 % — ABNORMAL HIGH (ref 12.0–46.0)
Lymphs Abs: 3.5 10*3/uL (ref 0.7–4.0)
MCHC: 33.5 g/dL (ref 30.0–36.0)
MCV: 98.8 fl (ref 78.0–100.0)
Monocytes Absolute: 0.8 10*3/uL (ref 0.1–1.0)
Monocytes Relative: 10.8 % (ref 3.0–12.0)
Neutro Abs: 2.7 10*3/uL (ref 1.4–7.7)
Neutrophils Relative %: 37.9 % — ABNORMAL LOW (ref 43.0–77.0)
Platelets: 198 10*3/uL (ref 150.0–400.0)
RBC: 4.25 Mil/uL (ref 4.22–5.81)
RDW: 15.2 % (ref 11.5–15.5)
WBC: 7.2 10*3/uL (ref 4.0–10.5)

## 2018-10-07 LAB — COMPREHENSIVE METABOLIC PANEL
ALT: 11 U/L (ref 0–53)
AST: 15 U/L (ref 0–37)
Albumin: 3.6 g/dL (ref 3.5–5.2)
Alkaline Phosphatase: 77 U/L (ref 39–117)
BUN: 14 mg/dL (ref 6–23)
CO2: 27 mEq/L (ref 19–32)
Calcium: 8.8 mg/dL (ref 8.4–10.5)
Chloride: 101 mEq/L (ref 96–112)
Creatinine, Ser: 1.14 mg/dL (ref 0.40–1.50)
GFR: 78.08 mL/min (ref >60.00)
Glucose, Bld: 160 mg/dL — ABNORMAL HIGH (ref 70–99)
Potassium: 3.4 mEq/L — ABNORMAL LOW (ref 3.5–5.1)
Sodium: 138 mEq/L (ref 135–145)
Total Bilirubin: 0.5 mg/dL (ref 0.2–1.2)
Total Protein: 8 g/dL (ref 6.0–8.3)

## 2018-10-07 LAB — LIPID PANEL
Cholesterol: 135 mg/dL (ref 0–200)
HDL: 45.3 mg/dL (ref >39.00)
NonHDL: 89.6
Total CHOL/HDL Ratio: 3
Triglycerides: 232 mg/dL — ABNORMAL HIGH (ref 0.0–149.0)
VLDL: 46.4 mg/dL — ABNORMAL HIGH (ref 0.0–40.0)

## 2018-10-07 LAB — MAGNESIUM: Magnesium: 1.5 mg/dL (ref 1.5–2.5)

## 2018-10-07 LAB — TSH: TSH: 3.06 u[IU]/mL (ref 0.35–4.50)

## 2018-10-07 LAB — PROTIME-INR
INR: 1.1 ratio — ABNORMAL HIGH (ref 0.8–1.0)
Prothrombin Time: 12.8 s (ref 9.6–13.1)

## 2018-10-07 LAB — HEMOGLOBIN A1C: Hgb A1c MFr Bld: 10.3 % — ABNORMAL HIGH (ref 4.6–6.5)

## 2018-10-07 LAB — BRAIN NATRIURETIC PEPTIDE: Pro B Natriuretic peptide (BNP): 80 pg/mL (ref 0.0–100.0)

## 2018-10-07 LAB — LDL CHOLESTEROL, DIRECT: Direct LDL: 71 mg/dL

## 2018-10-11 ENCOUNTER — Other Ambulatory Visit: Payer: Self-pay

## 2018-10-11 DIAGNOSIS — R059 Cough, unspecified: Secondary | ICD-10-CM

## 2018-10-11 DIAGNOSIS — E782 Mixed hyperlipidemia: Secondary | ICD-10-CM

## 2018-10-11 DIAGNOSIS — R05 Cough: Secondary | ICD-10-CM

## 2018-10-13 ENCOUNTER — Ambulatory Visit (INDEPENDENT_AMBULATORY_CARE_PROVIDER_SITE_OTHER): Payer: Medicare Other

## 2018-10-13 ENCOUNTER — Other Ambulatory Visit: Payer: Medicare Other

## 2018-10-13 ENCOUNTER — Other Ambulatory Visit: Payer: Self-pay

## 2018-10-13 VITALS — BP 126/62 | Temp 98.1°F | Ht 69.0 in | Wt 305.0 lb

## 2018-10-13 DIAGNOSIS — Z0000 Encounter for general adult medical examination without abnormal findings: Secondary | ICD-10-CM | POA: Diagnosis not present

## 2018-10-13 DIAGNOSIS — R059 Cough, unspecified: Secondary | ICD-10-CM

## 2018-10-13 DIAGNOSIS — R05 Cough: Secondary | ICD-10-CM

## 2018-10-13 NOTE — Patient Instructions (Signed)
[name] [name] , Thank you for taking time to come for your Medicare Wellness Visit. I appreciate your ongoing commitment to your health goals. Please review the following plan we discussed and let me know if I can assist you in the future.   Screening recommendations/referrals: Colorectal Screening: recommended to follow up with Dr. Paulita Fujita   Vision and Dental Exams: Recommended annual ophthalmology exams for early detection of glaucoma and other disorders of the eye Recommended annual dental exams for proper oral hygiene  Diabetic Exams: Diabetic Eye Exam: up to date  Diabetic Foot Exam: completed 10/04/18  Vaccinations: Influenza vaccine:  recommended this fall either at PCP office or through your local pharmacy  Pneumococcal vaccine: next at age 13 Tdap vaccine: Please call your insurance company to determine your out of pocket expense. You may also receive this vaccine at your local pharmacy or Health Dept. Shingles vaccine: Please call your insurance company to determine your out of pocket expense for the Shingrix vaccine. You may receive this vaccine at your local pharmacy.  Advanced directives: Advance directives discussed with you today. I have provided a copy for you to complete at home and have notarized. Once this is complete please bring a copy in to our office so we can scan it into your chart.  Goals: Recommend to drink at least 6-8 8oz glasses of water per day and to decrease portion sizes by eating 3 small healthy meals and at least 2 healthy snacks per day.   Next appointment: Please schedule your Annual Wellness Visit with your Nurse Health Advisor in one year.  Preventive Care 40-64 Years, Male Preventive care refers to lifestyle choices and visits with your health care provider that can promote health and wellness. What does preventive care include?  A yearly physical exam. This is also called an annual well check.  Dental exams once or twice a year.  Routine eye  exams. Ask your health care provider how often you should have your eyes checked.  Personal lifestyle choices, including:  Daily care of your teeth and gums.  Regular physical activity.  Eating a healthy diet.  Avoiding tobacco and drug use.  Limiting alcohol use.  Practicing safe sex.  Taking low-dose aspirin every day starting at age 69 if recommended by your health care provider.  Taking vitamin and mineral supplements as recommended by your health care provider. What happens during an annual well check? The services and screenings done by your health care provider during your annual well check will depend on your age, overall health, lifestyle risk factors, and family history of disease. Counseling  Your health care provider may ask you questions about your:  Alcohol use.  Tobacco use.  Drug use.  Emotional well-being.  Home and relationship well-being.  Sexual activity.  Eating habits.  Work and work Statistician. Screening  You may have the following tests or measurements:  Height, weight, and BMI.  Blood pressure.  Lipid and cholesterol levels. These may be checked every 5 years, or more frequently if you are over 55 years old.  Skin check.  Lung cancer screening. You may have this screening every year starting at age 61 if you have a 30-pack-year history of smoking and currently smoke or have quit within the past 15 years.  Fecal occult blood test (FOBT) of the stool. You may have this test every year starting at age 81.  Flexible sigmoidoscopy or colonoscopy. You may have a sigmoidoscopy every 5 years or a colonoscopy every 10 years  starting at age 24.  Prostate cancer screening. Recommendations will vary depending on your family history and other risks.  Hepatitis C blood test.  Hepatitis B blood test.  Sexually transmitted disease (STD) testing.  Diabetes screening. This is done by checking your blood sugar (glucose) after you have not eaten  for a while (fasting). You may have this done every 1-3 years. Discuss your test results, treatment options, and if necessary, the need for more tests with your health care provider. Vaccines  Your health care provider may recommend certain vaccines, such as:  Influenza vaccine. This is recommended every year.  Tetanus, diphtheria, and acellular pertussis (Tdap, Td) vaccine. You may need a Td booster every 10 years.  Zoster vaccine. You may need this after age 56.  Pneumococcal 13-valent conjugate (PCV13) vaccine. You may need this if you have certain conditions and have not been vaccinated.  Pneumococcal polysaccharide (PPSV23) vaccine. You may need one or two doses if you smoke cigarettes or if you have certain conditions. Talk to your health care provider about which screenings and vaccines you need and how often you need them. This information is not intended to replace advice given to you by your health care provider. Make sure you discuss any questions you have with your health care provider. Document Released: 02/23/2015 Document Revised: 10/17/2015 Document Reviewed: 11/28/2014 Elsevier Interactive Patient Education  2017 [employer].

## 2018-10-13 NOTE — Progress Notes (Signed)
Subjective:   [name] [name] is a 63 y.o. male who presents for an Initial Medicare Annual Wellness Visit.  Review of Systems   Cardiac Risk Factors include: sedentary lifestyle;advanced age (>20men, >45 women);smoking/ tobacco exposure;hypertension;male gender;dyslipidemia;diabetes mellitus    Objective:    Today's Vitals   10/13/18 1417  BP: 126/62  Temp: 98.1 F (36.7 C)  TempSrc: Temporal  Weight: (!) 305 lb (138.3 kg)  Height: 5\' 9"  (1.753 m)   Body mass index is 45.04 kg/m.  Advanced Directives 10/13/2018 04/13/2017 02/25/2015 10/20/2014  Does Patient Have a Medical Advance Directive? No No No No  Would patient like information on creating a medical advance directive? Yes (MAU/Ambulatory/Procedural Areas - Information given) - No - patient declined information -    Current Medications (verified) Outpatient Encounter Medications as of 10/13/2018  Medication Sig   ACCU-CHEK AVIVA PLUS test strip    albuterol (PROVENTIL HFA;VENTOLIN HFA) 108 (90 Base) MCG/ACT inhaler Inhale 2 puffs into the lungs every 6 (six) hours as needed for wheezing or shortness of breath.   albuterol (PROVENTIL) (2.5 MG/3ML) 0.083% nebulizer solution Take 3 mLs (2.5 mg total) by nebulization every 6 (six) hours as needed for wheezing or shortness of breath.   aspirin EC 81 MG tablet Take 81 mg by mouth daily.    B-D ULTRAFINE III SHORT PEN 31G X 8 MM MISC    diclofenac sodium (VOLTAREN) 1 % GEL APPLY 2 GRAMS TOPICALLY 4 TIMES DAILY TO KNEE   eplerenone (INSPRA) 25 MG tablet TAKE 1 TABLET BY MOUTH ONCE DAILY   fluticasone (FLONASE) 50 MCG/ACT nasal spray Place 1 spray into both nostrils daily.   HUMALOG KWIKPEN 200 UNIT/ML SOPN Inject 10-30 Units into the skin 3 (three) times daily with meals. 20 units with smaller meals 30 units with regular meals and 10 units with snacks. Sliding Scale   HYDROcodone-homatropine (HYCODAN) 5-1.5 MG/5ML syrup Take 5 mLs by mouth every 6 (six) hours as needed for  cough.   ipratropium (ATROVENT) 0.02 % nebulizer solution Take 2.5 mLs (0.5 mg total) by nebulization every 6 (six) hours as needed for wheezing or shortness of breath.   ipratropium (ATROVENT) 0.06 % nasal spray Place 2 sprays into both nostrils 4 (four) times daily.   Lancets (ACCU-CHEK MULTICLIX) lancets    LANTUS SOLOSTAR 100 UNIT/ML Solostar Pen Inject 67 Units into the skin every morning.    losartan (COZAAR) 50 MG tablet TAKE 1/2 TABLET BY MOUTH EVERY DAY   Magnesium Oxide 400 (240 Mg) MG TABS TAKE 1 TABLET(400 MG) BY MOUTH DAILY   metoprolol tartrate (LOPRESSOR) 50 MG tablet Take 1 tablet (50 mg total) by mouth 2 (two) times daily.   nitroGLYCERIN (NITROSTAT) 0.4 MG SL tablet Place 1 tablet (0.4 mg total) under the tongue every 5 (five) minutes as needed for chest pain.   potassium chloride SA (K-DUR) 20 MEQ tablet Take 2 tablets (40 mEq total) by mouth 3 (three) times daily.   rosuvastatin (CRESTOR) 20 MG tablet Take 1 tablet (20 mg total) by mouth every evening.   torsemide (DEMADEX) 20 MG tablet Take 1 tablet (20 mg total) by mouth 2 (two) times daily.   [DISCONTINUED] simvastatin (ZOCOR) 80 MG tablet Take 80 mg by mouth daily.     No facility-administered encounter medications on file as of 10/13/2018.     Allergies (verified) Ibuprofen   History: Past Medical History:  Diagnosis Date   Acute myocardial infarction, unspecified site, episode of care unspecified 2005  Acute pancreatitis    CAD (coronary artery disease)    a. CABG in 2005 w LIMA to LAD, left radial to second circumflex marginal, saphenous vein graft to PDA, saphenous vein graft to lateral subbrach of ramus intermediate, and sequential saphenous vein graft to the medial subbranch of ramus intermediate.   Chronic diastolic CHF (congestive heart failure) (HCC)    Cirrhosis of liver without mention of alcohol    CKD (chronic kidney disease), stage II    Complications affecting other specified body  systems, hypertension    Ectopic atrial rhythm    Essential hypertension    Hyperlipidemia    Hypokalemia    a. intermitttent noncompliance with potassium supplement.   Hypomagnesemia    Morbid obesity (HCC)    Neuropathy    Other and unspecified hyperlipidemia    Proteinuria    RBBB    Sleep apnea    Type II or unspecified type diabetes mellitus without mention of complication, not stated as uncontrolled    Varicose veins of both lower extremities    Past Surgical History:  Procedure Laterality Date   CORONARY ARTERY BYPASS GRAFT     ENT surgery     Family History  Problem Relation Age of Onset   Breast cancer Mother    Prostate cancer Father    Coronary artery disease Father        underwent CABG   Social History   Socioeconomic History   Marital status: Significant Other    Spouse name: Not on file   Number of children: Not on file   Years of education: Not on file   Highest education level: Not on file  Occupational History   Occupation: Disabled     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 strain: Not on file   Food insecurity    Worry: Never true    Inability: Never true   Transportation needs    Medical: Not on file    Non-medical: Not on file  Tobacco Use   Smoking status: Former Smoker    Packs/day: 1.50    Years: 30.00    Pack years: 45.00    Quit date: 10/20/2004    Years since quitting: 13.9   Smokeless tobacco: Never Used  Substance and Sexual Activity   Alcohol use: Yes    Comment: occasional   Drug use: No   Sexual activity: Not on file  Lifestyle   Physical activity    Days per week: Not on file    Minutes per session: Not on file   Stress: Not on file  Relationships   Social connections    Talks on phone: Not on file    Gets together: Not on file    Attends religious service: Not on file    Active member of club or organization: Not on file    Attends meetings of  clubs or organizations: Not on file    Relationship status: Living with partner  Other Topics Concern   Not on file  Social History Narrative   Lives with significant other x 30+ years, apartment "friends smoke". Disability. Is disabled secondary to his work-related injury.       Has over 20 grandchildren    Tobacco Counseling Counseling given: Not Answered   Clinical Intake:  Pre-visit preparation completed: No  Nutritional Status: BMI > 30  Obese Diabetes: Yes CBG done?: No Did pt. bring in CBG monitor from home?: No  How often  do you need to have someone help you when you read instructions, pamphlets, or other written materials from your doctor or pharmacy?: 3 - Sometimes  Interpreter Needed?: No  Information entered by :: Denman George LPN  Activities of Daily Living In your present state of health, do you have any difficulty performing the following activities: 10/13/2018 10/05/2018  Hearing? N Y  Comment - Pt said every now and then.  Will get hearing tested.  Vision? N N  Difficulty concentrating or making decisions? N N  Walking or climbing stairs? Y Y  Comment - Sometimes climbing stairs.  Dressing or bathing? N N  Doing errands, shopping? N N  Preparing Food and eating ? N -  Using the Toilet? N -  In the past six months, have you accidently leaked urine? N -  Do you have problems with loss of bowel control? N -  Managing your Medications? N -  Managing your Finances? N -  Housekeeping or managing your Housekeeping? N -  Some recent data might be hidden     Immunizations and Health Maintenance Immunization History  Administered Date(s) Administered   Influenza Split 11/11/2010, 10/23/2011, 11/10/2012   Influenza,inj,Quad PF,6+ Mos 11/10/2013, 12/10/2016   Influenza-Unspecified 11/30/2015   Pneumococcal Polysaccharide-23 06/26/2008   Health Maintenance Due  Topic Date Due   HIV Screening  01/16/1969   TETANUS/TDAP  01/16/1973   COLONOSCOPY   01/17/2004   INFLUENZA VACCINE  09/11/2018    Patient Care Team: Briscoe Deutscher, DO as PCP - General (Family Medicine) End, Harrell Gave, MD as PCP - Cardiology (Cardiology) Delrae Rend, MD as Consulting Physician (Endocrinology) Gardiner Barefoot, DPM as Consulting Physician (Podiatry) Deneise Lever, MD as Consulting Physician (Pulmonary Disease) [name], [name], NP as Nurse Practitioner (Vascular Surgery) [facility] as Consulting Physician  Indicate any recent Medical Services you may have received from other than [facility] providers in the past year (date may be approximate).    Assessment:   This is a routine wellness examination for Anselm.  Hearing/Vision screen No exam data present  Dietary issues and exercise activities discussed: Talked at length about food choices and decreasing the amount of sodium intake.   He is currently eating a lot of processed foods and drinking mostly soda.   Current Exercise Habits: The patient does not participate in regular exercise at present  Goals     DIET - [place]     Patient drinks a lot of coke and is going to start working towards drinking more water      DIET - REDUCE SODIUM INTAKE     Patient is going to work on changing eating habits to fewer fried foods and sodium filled meats       Depression Screen PHQ 2/9 Scores 10/05/2018 03/24/2016  PHQ - 2 Score 0 0  PHQ- 9 Score 0 -    Fall Risk Fall Risk  10/13/2018  Falls in the past year? 0  Number falls in past yr: 0  Injury with Fall? 0  Risk for fall due to : Impaired balance/gait  Follow up Education provided    Is the patient's home free of loose throw rugs in walkways, pet beds, electrical cords, etc?   yes      Grab bars in the bathroom? no      Handrails on the stairs?   yes      Adequate lighting?   yes  Timed Get Up and Go performed: completed  and within normal timeframe; slight hesitation when arising from chair    Cognitive Function-no cognitive concerns at this time         Screening Tests Health Maintenance  Topic Date Due   HIV Screening  01/16/1969   TETANUS/TDAP  01/16/1973   COLONOSCOPY  01/17/2004   INFLUENZA VACCINE  09/11/2018   OPHTHALMOLOGY EXAM  01/01/2019   FOOT EXAM  03/17/2019   HEMOGLOBIN A1C  04/09/2019   Hepatitis C Screening  Completed    Qualifies for Shingles Vaccine? Discussed and patient will check with pharmacy for coverage.  Patient education handout provided    Cancer Screenings: Lung: Low Dose CT Chest recommended if Age 73-80 years, 30 pack-year currently smoking OR have quit w/in 15years. Patient does not qualify. Colorectal: patient plans to follow up with Dr. Paulita Fujita      Plan:    I have personally reviewed and addressed the Medicare Annual Wellness questionnaire and have noted the following in the patients chart:  A. Medical and social history B. Use of alcohol, tobacco or illicit drugs  C. Current medications and supplements D. Functional ability and status E.  Nutritional status F.  Physical activity G. Advance directives H. List of other physicians I.  Hospitalizations, surgeries, and ER visits in previous 12 months J.  [place] such as hearing and vision if needed, cognitive and depression L. Referrals, records requested, and appointments- none   In addition, I have reviewed and discussed with patient certain preventive protocols, quality metrics, and best practice recommendations. A written personalized care plan for preventive services as well as general preventive health recommendations were provided to patient.   Signed,  Denman George, LPN  Nurse Health Advisor   Nurse Notes:  Patient is asking for an rx for compression hose to be sent to [facility].  States that current pair are worn.  Please advise.

## 2018-10-13 NOTE — Progress Notes (Signed)
I have reviewed documentation for AWV and Advance Care planning provided by [facility], I agree with documentation, I was immediately available for any questions. Imre Vecchione, DO   

## 2018-10-14 DIAGNOSIS — Z794 Long term (current) use of insulin: Secondary | ICD-10-CM | POA: Insufficient documentation

## 2018-10-14 DIAGNOSIS — E1169 Type 2 diabetes mellitus with other specified complication: Secondary | ICD-10-CM | POA: Insufficient documentation

## 2018-10-20 ENCOUNTER — Telehealth: Payer: Self-pay | Admitting: Internal Medicine

## 2018-10-20 DIAGNOSIS — G4733 Obstructive sleep apnea (adult) (pediatric): Secondary | ICD-10-CM

## 2018-10-20 NOTE — Telephone Encounter (Signed)
Home sleep test showed severe obstructive sleep apnea, averaging 32 apneas/ hour with drops in blood oxygen levels.  Recommend we order DME, CPAP auto 5-20, mask of choice,huidifier, supplies, AirView  Please make sure he has f/u ov in 31-90 days per insurance requirements.

## 2018-10-20 NOTE — Telephone Encounter (Signed)
Spoke with pt and notified of results per Dr. Annamaria Boots. Pt verbalized understanding and denied any questions. CPAP ordered and ov with CDY scheduled for 12/29/2018

## 2018-10-20 NOTE — Telephone Encounter (Signed)
Call returned to patient, he states had a home sleep study back in July.   HST 047/29/2020 (scanned into epic)  CY please advise of HST results. Thanks.

## 2018-10-21 ENCOUNTER — Telehealth: Payer: Self-pay | Admitting: Internal Medicine

## 2018-10-21 NOTE — Telephone Encounter (Signed)
Returned call to [place] at [facility].  Bethanne Ginger states patient must have OV within 6 months of ordering a CPAP machine.  Since patient has not had OV since January 2020, he will need a scheduled OV now in order to fill the referral request for CPAP.   Patient currently has OV for Nov 2020.  Contacted patient by phone and explained [facility] call to state patient's insurance requires OV within 6 month of CPAP order.  Patient will need to come in for visit before they can set up CPAP. Patient acknowledged understanding and OV scheduled for 11/01/18 at 2:00 pm.  Patient needed afternoon appt a couple of weeks away to plan for his transportation.  Dr. Annamaria Boots had no available appointments and patient agreed to schedule with NP.  Nothing further needed at this time.

## 2018-10-25 ENCOUNTER — Encounter: Payer: Self-pay | Admitting: Cardiology

## 2018-10-25 ENCOUNTER — Ambulatory Visit (INDEPENDENT_AMBULATORY_CARE_PROVIDER_SITE_OTHER): Payer: Medicare Other | Admitting: Cardiology

## 2018-10-25 ENCOUNTER — Encounter: Payer: Self-pay | Admitting: Family Medicine

## 2018-10-25 ENCOUNTER — Other Ambulatory Visit: Payer: Self-pay

## 2018-10-25 ENCOUNTER — Encounter (INDEPENDENT_AMBULATORY_CARE_PROVIDER_SITE_OTHER): Payer: Self-pay

## 2018-10-25 VITALS — BP 102/60 | HR 91 | Ht 69.0 in | Wt 307.4 lb

## 2018-10-25 DIAGNOSIS — I25708 Atherosclerosis of coronary artery bypass graft(s), unspecified, with other forms of angina pectoris: Secondary | ICD-10-CM

## 2018-10-25 DIAGNOSIS — I5032 Chronic diastolic (congestive) heart failure: Secondary | ICD-10-CM | POA: Diagnosis not present

## 2018-10-25 DIAGNOSIS — I10 Essential (primary) hypertension: Secondary | ICD-10-CM

## 2018-10-25 DIAGNOSIS — E876 Hypokalemia: Secondary | ICD-10-CM | POA: Diagnosis not present

## 2018-10-25 DIAGNOSIS — I491 Atrial premature depolarization: Secondary | ICD-10-CM

## 2018-10-25 NOTE — Progress Notes (Signed)
Cardiology Office Note:    Date:  10/25/2018   ID:  [name] [name], DOB [birth year], MRN [id number]  PCP:  Briscoe Deutscher, DO  Cardiologist:  Candee Furbish, MD  Electrophysiologist:  None   Referring MD: Briscoe Deutscher, DO   Heart failure follow-up  History of Present Illness:    [name] [name] is a 63 y.o. male with CAD post CABG in 2005 with morbid obesity chronic diastolic heart failure prior BMP normal ectopic atrial tachycardia right bundle branch block cirrhosis obstructive sleep apnea followed by Dr. Annamaria Boots here to reestablish, follow-up from virtual visit on 06/24/2018.  Nuclear stress test 2015 normal.  No alcohol.  Prior cardiology rhythm strips appeared to be ectopic atrial rhythm.  Dr. Curt Bears reviewed.  Metoprolol increased to twice daily.  EF low normal.  Minor shortness of breath with activity.  No chest pain.  Using TED hose.  Trying to do the best he can to lose weight.  Stop sodas.  Dr. Juleen China is working hard with him on this.  Fluid restriction and salt restriction once again reviewed.  NYHA class I-II.  Past Medical History:  Diagnosis Date  . Acute myocardial infarction, unspecified site, episode of care unspecified 2005  . Acute pancreatitis   . CAD (coronary artery disease)    a. CABG in 2005 w LIMA to LAD, left radial to second circumflex marginal, saphenous vein graft to PDA, saphenous vein graft to lateral subbrach of ramus intermediate, and sequential saphenous vein graft to the medial subbranch of ramus intermediate.  . Chronic diastolic CHF (congestive heart failure) ([place])   . Cirrhosis of liver without mention of alcohol   . CKD (chronic kidney disease), stage II   . Complications affecting other specified body systems, hypertension   . Ectopic atrial rhythm   . Essential hypertension   . Hyperlipidemia   . Hypokalemia    a. intermitttent noncompliance with potassium supplement.  . Hypomagnesemia   . Morbid obesity ([place])   . Neuropathy   . Other and  unspecified hyperlipidemia   . Proteinuria   . RBBB   . Sleep apnea   . Type II or unspecified type diabetes mellitus without mention of complication, not stated as uncontrolled   . Varicose veins of both lower extremities     Past Surgical History:  Procedure Laterality Date  . CORONARY ARTERY BYPASS GRAFT    . ENT surgery      Current Medications: Current Meds  Medication Sig  . ACCU-CHEK AVIVA PLUS test strip   . albuterol (PROVENTIL HFA;VENTOLIN HFA) 108 (90 Base) MCG/ACT inhaler Inhale 2 puffs into the lungs every 6 (six) hours as needed for wheezing or shortness of breath.  Marland Kitchen albuterol (PROVENTIL) (2.5 MG/3ML) 0.083% nebulizer solution Take 3 mLs (2.5 mg total) by nebulization every 6 (six) hours as needed for wheezing or shortness of breath.  Marland Kitchen aspirin EC 81 MG tablet Take 81 mg by mouth daily.   . B-D ULTRAFINE III SHORT PEN 31G X 8 MM MISC   . diclofenac sodium (VOLTAREN) 1 % GEL APPLY 2 GRAMS TOPICALLY 4 TIMES DAILY TO KNEE  . eplerenone (INSPRA) 25 MG tablet TAKE 1 TABLET BY MOUTH ONCE DAILY  . fluticasone (FLONASE) 50 MCG/ACT nasal spray Place 1 spray into both nostrils daily.  . furosemide (LASIX) 20 MG tablet Take 20 mg by mouth 2 (two) times daily.  Marland Kitchen HUMALOG KWIKPEN 200 UNIT/ML SOPN Inject 10-30 Units into the skin 3 (three) times daily with meals. [place]  units with smaller meals 30 units with regular meals and 10 units with snacks. Sliding Scale  . HYDROcodone-homatropine (HYCODAN) 5-1.5 MG/5ML syrup Take 5 mLs by mouth every 6 (six) hours as needed for cough.  Marland Kitchen ipratropium (ATROVENT) 0.02 % nebulizer solution Take 2.5 mLs (0.5 mg total) by nebulization every 6 (six) hours as needed for wheezing or shortness of breath.  Marland Kitchen ipratropium (ATROVENT) 0.06 % nasal spray Place 2 sprays into both nostrils 4 (four) times daily.  . Lancets (ACCU-CHEK MULTICLIX) lancets   . LANTUS SOLOSTAR 100 UNIT/ML Solostar Pen Inject 67 Units into the skin every morning.   Marland Kitchen losartan (COZAAR) 50  MG tablet TAKE 1/2 TABLET BY MOUTH EVERY DAY  . Magnesium Oxide 400 (240 Mg) MG TABS TAKE 1 TABLET(400 MG) BY MOUTH DAILY  . metoprolol tartrate (LOPRESSOR) 50 MG tablet Take 1 tablet (50 mg total) by mouth 2 (two) times daily.  . nitroGLYCERIN (NITROSTAT) 0.4 MG SL tablet Place 1 tablet (0.4 mg total) under the tongue every 5 (five) minutes as needed for chest pain.  . potassium chloride SA (K-DUR) 20 MEQ tablet Take 2 tablets (40 mEq total) by mouth 3 (three) times daily.  . rosuvastatin (CRESTOR) 20 MG tablet Take 1 tablet (20 mg total) by mouth every evening.  . torsemide (DEMADEX) 20 MG tablet Take 1 tablet (20 mg total) by mouth 2 (two) times daily.     Allergies:   Ibuprofen   Social History   Socioeconomic History  . Marital status: Significant Other    Spouse name: Not on file  . Number of children: Not on file  . Years of education: Not on file  . Highest education level: Not on file  Occupational History  . Occupation: Disabled     Comment: previously worked in Press photographer  . Financial resource strain: Not on file  . Food insecurity    Worry: Never true    Inability: Never true  . Transportation needs    Medical: Not on file    Non-medical: Not on file  Tobacco Use  . Smoking status: Former Smoker    Packs/day: 1.50    Years: 30.00    Pack years: 45.00    Quit date: 10/20/2004    Years since quitting: 14.0  . Smokeless tobacco: Never Used  Substance and Sexual Activity  . Alcohol use: Yes    Comment: occasional  . Drug use: No  . Sexual activity: Not on file  Lifestyle  . Physical activity    Days per week: Not on file    Minutes per session: Not on file  . Stress: Not on file  Relationships  . Social Herbalist on phone: Not on file    Gets together: Not on file    Attends religious service: Not on file    Active member of club or organization: Not on file    Attends meetings of clubs or organizations: Not on file    Relationship  status: Living with partner  Other Topics Concern  . Not on file  Social History Narrative   Lives with significant other x 30+ years, apartment "friends smoke". Disability. Is disabled secondary to his work-related injury.       Has over 20 grandchildren      Family History: The patient's family history includes Breast cancer in his mother; Coronary artery disease in his father; Prostate cancer in his father.  ROS:   Please see the  history of present illness.    No fevers chills nausea vomiting syncope bleeding all other systems reviewed and are negative.  EKGs/Labs/Other Studies Reviewed:    The following studies were reviewed today: Echo stress test as above  EKG: RBBB 81  Recent Labs: 10/07/2018: ALT 11; BUN 14; Creatinine, Ser 1.14; Hemoglobin 14.1; Magnesium 1.5; Platelets 198.0; Potassium 3.4; Pro B Natriuretic peptide (BNP) 80.0; Sodium 138; TSH 3.06  Recent Lipid Panel    Component Value Date/Time   CHOL 135 10/07/2018 1447   TRIG 232.0 (H) 10/07/2018 1447   HDL 45.30 10/07/2018 1447   CHOLHDL 3 10/07/2018 1447   VLDL 46.4 (H) 10/07/2018 1447   LDLCALC 80 01/08/2010 0837   LDLDIRECT 71.0 10/07/2018 1447    Physical Exam:    VS:  BP 102/60   Pulse 91   Ht 5\' 9"  (1.753 m)   Wt (!) 307 lb 6.4 oz (139.4 kg)   SpO2 94%   BMI 45.40 kg/m     Wt Readings from Last 3 Encounters:  10/25/18 (!) 307 lb 6.4 oz (139.4 kg)  10/13/18 (!) 305 lb (138.3 kg)  10/05/18 (!) 301 lb (136.5 kg)     GEN:  Well nourished, well developed in no acute distress HEENT: Normal NECK: No JVD; No carotid bruits LYMPHATICS: No lymphadenopathy CARDIAC: RRR, no murmurs, rubs, gallops RESPIRATORY:  Clear to auscultation without rales, wheezing or rhonchi  ABDOMEN: Soft, non-tender, non-distended MUSCULOSKELETAL:  Chronic LE edema; TED hose.  No deformity  SKIN: Warm and dry NEUROLOGIC:  Alert and oriented x 3 PSYCHIATRIC:  Normal affect   ASSESSMENT:    1. Chronic diastolic CHF  (congestive heart failure) ([place])   2. Coronary artery disease of bypass graft of native heart with stable angina pectoris ([place])   3. Essential hypertension   4. Hypokalemia   5. Ectopic atrial rhythm    PLAN:    In order of problems listed above:  Coronary disease -Status post CABG stable no anginal symptoms.  Chronic diastolic heart failure - on torsemide.  Compression hose.  Hinton Dyer removed erroneous furosemide from med list previously.  Continue with eplerenone as well and potassium supplementation.  Dr. Juleen China is been watching.  Weight loss essential.  Salt and fluid restriction discussed again.  Morbid obesity -Working on decreasing sodas.  Weight loss.  Certainly playing a major role in all of his symptoms.  Ectopic atrial rhythm - Metoprolol.  Asymptomatic.  Hypokalemia - Dr. Juleen China is monitoring.  Continue with eplerenone and potassium supplementation while on Demadex.   Medication Adjustments/Labs and Tests Ordered: Current medicines are reviewed at length with the patient today.  Concerns regarding medicines are outlined above.  No orders of the defined types were placed in this encounter.  No orders of the defined types were placed in this encounter.   Patient Instructions  Your physician recommends that you continue on your current medications as directed. Please refer to the Current Medication list given to you today.  Your physician wants you to follow-up in: [place] will receive a reminder letter in the mail two months in advance. If you don't receive a letter, please call our office to schedule the follow-up appointment.     Signed, Candee Furbish, MD  10/25/2018 11:17 AM    [place]

## 2018-10-25 NOTE — Patient Instructions (Signed)
Your physician recommends that you continue on your current medications as directed. Please refer to the Current Medication list given to you today.  Your physician wants you to follow-up in: [place] will receive a reminder letter in the mail two months in advance. If you don't receive a letter, please call our office to schedule the follow-up appointment.

## 2018-10-27 DIAGNOSIS — R600 Localized edema: Secondary | ICD-10-CM | POA: Insufficient documentation

## 2018-10-27 NOTE — Assessment & Plan Note (Addendum)
Followed by Endocrinology. Due for labs.

## 2018-10-27 NOTE — Assessment & Plan Note (Signed)
Echo 05/08/17  Study Conclusions  - Left ventricle: The cavity size was normal. Wall thickness was   normal. Systolic function was normal. The estimated ejection   fraction was in the range of 50% to 55%. Left ventricular   diastolic function parameters were normal. - Mitral valve: There was mild regurgitation. - Right ventricle: The cavity size was mildly dilated. - Right atrium: The atrium was moderately dilated.

## 2018-10-27 NOTE — Assessment & Plan Note (Signed)
Followed by Cardiology. No concerns today. Tolerating medications without side effects.

## 2018-10-27 NOTE — Assessment & Plan Note (Signed)
Is the patient taking medications without problems? Yes. Does the patient complain of muscle aches?  No. Trying to exercise on a regular basis? No. Compliant with diet? No.  Lab Results  Component Value Date   CHOL 135 10/07/2018   HDL 45.30 10/07/2018   LDLCALC 80 01/08/2010   LDLDIRECT 71.0 10/07/2018   TRIG 232.0 (H) 10/07/2018   CHOLHDL 3 10/07/2018   Lab Results  Component Value Date   ALT 11 10/07/2018   AST 15 10/07/2018   ALKPHOS 77 10/07/2018   BILITOT 0.5 10/07/2018

## 2018-10-27 NOTE — Assessment & Plan Note (Signed)
Wt Readings from Last 3 Encounters:  10/25/18 (!) 307 lb 6.4 oz (139.4 kg)  10/13/18 (!) 305 lb (138.3 kg)  10/05/18 (!) 301 lb (136.5 kg)   The patient is asked to make an attempt to improve diet and exercise patterns to aid in medical management of this problem.

## 2018-11-01 ENCOUNTER — Ambulatory Visit (INDEPENDENT_AMBULATORY_CARE_PROVIDER_SITE_OTHER): Payer: Medicare Other | Admitting: Primary Care

## 2018-11-01 ENCOUNTER — Other Ambulatory Visit: Payer: Self-pay

## 2018-11-01 ENCOUNTER — Encounter: Payer: Self-pay | Admitting: Primary Care

## 2018-11-01 VITALS — BP 130/68 | HR 90 | Ht 69.0 in | Wt 306.4 lb

## 2018-11-01 DIAGNOSIS — G4733 Obstructive sleep apnea (adult) (pediatric): Secondary | ICD-10-CM | POA: Diagnosis not present

## 2018-11-01 DIAGNOSIS — J40 Bronchitis, not specified as acute or chronic: Secondary | ICD-10-CM

## 2018-11-01 MED ORDER — GUAIFENESIN ER 600 MG PO TB12: 1200.0000 mg | ORAL_TABLET | Freq: Two times a day (BID) | ORAL | 1 refills | Status: DC

## 2018-11-01 NOTE — Progress Notes (Signed)
@Patient  ID: [name] [name], male    DOB: [birth year], 63 y.o.   MRN: [id number]  Chief Complaint  Patient presents with  . Follow-up    ov needed per insurance to set up on cpap.  pt states he's doing well.     Referring provider: Briscoe Deutscher, DO  HPI: 63 year old male, former smoker quit 2006 (45 pack year hx). PMH significant for OSA, seasonal allergies, diastolic CHF, hypertension, type 2 diabetes, hepatic cirrhosis, pancreatitis, PVD, HLD, obesity. Patient of Dr. Annamaria Boots, last seen in January 2020 for bronchitis. Saw Dr. Annamaria Boots in July 2019, restarted CPAP and recommended 3 month follow-up.   11/01/2018 Patient presents today for annual OSA follow-up.  Unortunately there is an issue with CPAP order is outside 72-month window for a new order.  He was told by Ace Gins that he needed an office visit.  States that he has not been on CPAP since 2005-2006.  Sleeping okay, occasionally will wake up 2-3 times at night.  He reports some trouble falling back to sleep.  He has been told that he does snore loudly.   He is having no breathing issues.  Occasional mild intermittent dyspnea with walking/wearing mask. Associated cough and chest congestion, unable to get mucus up. Using nebulizer three or more times a day  Significant testing: HST-08/05/2016-AHI 37.5/hour, desaturation to 67%, body weight 294 pounds  Spirometry 2013- FVC 2.7 (56%), FEV1 2.26 (66%), ratio 84, DLCO 52%.  Moderate restriction, flow loop normal. No sig BD response  Allergies  Allergen Reactions  . Ibuprofen Other (See Comments)    Stomach pain    Immunization History  Administered Date(s) Administered  . Influenza Split 11/11/2010, 10/23/2011, 11/10/2012  . Influenza,inj,Quad PF,6+ Mos 11/10/2013, 12/10/2016  . Influenza-Unspecified 11/30/2015  . Pneumococcal Polysaccharide-23 06/26/2008    Past Medical History:  Diagnosis Date  . Acute myocardial infarction, unspecified site, episode of care unspecified 2005  .  Acute pancreatitis   . CAD (coronary artery disease)    a. CABG in 2005 w LIMA to LAD, left radial to second circumflex marginal, saphenous vein graft to PDA, saphenous vein graft to lateral subbrach of ramus intermediate, and sequential saphenous vein graft to the medial subbranch of ramus intermediate.  . Chronic diastolic CHF (congestive heart failure) ([place])   . Cirrhosis of liver without mention of alcohol   . CKD (chronic kidney disease), stage II   . Complications affecting other specified body systems, hypertension   . Ectopic atrial rhythm   . Essential hypertension   . Hyperlipidemia   . Hypokalemia    a. intermitttent noncompliance with potassium supplement.  . Hypomagnesemia   . Morbid obesity ([place])   . Neuropathy   . Other and unspecified hyperlipidemia   . Proteinuria   . RBBB   . Sleep apnea   . Type II or unspecified type diabetes mellitus without mention of complication, not stated as uncontrolled   . Varicose veins of both lower extremities     Tobacco History: Social History   Tobacco Use  Smoking Status Former Smoker  . Packs/day: 1.50  . Years: 30.00  . Pack years: 45.00  . Quit date: 10/20/2004  . Years since quitting: 14.0  Smokeless Tobacco Never Used   Counseling given: Not Answered   Outpatient Medications Prior to Visit  Medication Sig Dispense Refill  . ACCU-CHEK AVIVA PLUS test strip     . albuterol (PROVENTIL HFA;VENTOLIN HFA) 108 (90 Base) MCG/ACT inhaler Inhale 2 puffs into the  lungs every 6 (six) hours as needed for wheezing or shortness of breath. 1 Inhaler 12  . albuterol (PROVENTIL) (2.5 MG/3ML) 0.083% nebulizer solution Take 3 mLs (2.5 mg total) by nebulization every 6 (six) hours as needed for wheezing or shortness of breath. 75 mL prn  . aspirin EC 81 MG tablet Take 81 mg by mouth daily.     . B-D ULTRAFINE III SHORT PEN 31G X 8 MM MISC   1  . diclofenac sodium (VOLTAREN) 1 % GEL APPLY 2 GRAMS TOPICALLY 4 TIMES DAILY TO KNEE 100 g 1   . eplerenone (INSPRA) 25 MG tablet TAKE 1 TABLET BY MOUTH ONCE DAILY 30 tablet 2  . fluticasone (FLONASE) 50 MCG/ACT nasal spray Place 1 spray into both nostrils daily. 16 g 12  . furosemide (LASIX) 20 MG tablet Take 20 mg by mouth 2 (two) times daily.    Marland Kitchen HUMALOG KWIKPEN 200 UNIT/ML SOPN Inject 10-30 Units into the skin 3 (three) times daily with meals. 20 units with smaller meals 30 units with regular meals and 10 units with snacks. Sliding Scale  0  . HYDROcodone-homatropine (HYCODAN) 5-1.5 MG/5ML syrup Take 5 mLs by mouth every 6 (six) hours as needed for cough. 120 mL 0  . ipratropium (ATROVENT) 0.02 % nebulizer solution Take 2.5 mLs (0.5 mg total) by nebulization every 6 (six) hours as needed for wheezing or shortness of breath. 75 mL prn  . ipratropium (ATROVENT) 0.06 % nasal spray Place 2 sprays into both nostrils 4 (four) times daily. 15 mL 0  . Lancets (ACCU-CHEK MULTICLIX) lancets     . LANTUS SOLOSTAR 100 UNIT/ML Solostar Pen Inject 67 Units into the skin every morning.   0  . losartan (COZAAR) 50 MG tablet TAKE 1/2 TABLET BY MOUTH EVERY DAY 45 tablet 1  . Magnesium Oxide 400 (240 Mg) MG TABS TAKE 1 TABLET(400 MG) BY MOUTH DAILY 90 tablet 0  . metoprolol tartrate (LOPRESSOR) 50 MG tablet Take 1 tablet (50 mg total) by mouth 2 (two) times daily. 180 tablet 3  . nitroGLYCERIN (NITROSTAT) 0.4 MG SL tablet Place 1 tablet (0.4 mg total) under the tongue every 5 (five) minutes as needed for chest pain. 25 tablet 0  . potassium chloride SA (K-DUR) 20 MEQ tablet Take 2 tablets (40 mEq total) by mouth 3 (three) times daily. 240 tablet 11  . rosuvastatin (CRESTOR) 20 MG tablet Take 1 tablet (20 mg total) by mouth every evening. 30 tablet 11  . torsemide (DEMADEX) 20 MG tablet Take 1 tablet (20 mg total) by mouth 2 (two) times daily. 180 tablet 3   No facility-administered medications prior to visit.    Review of Systems  Review of Systems  Constitutional: Positive for unexpected weight  change.       Weight gain  Respiratory: Positive for cough. Negative for shortness of breath and wheezing.        NP cough, intermittent DOE   Cardiovascular: Negative.    Physical Exam  BP 130/68 (BP Location: Right Arm, Cuff Size: Normal)   Pulse 90   Ht 5\' 9"  (1.753 m)   Wt (!) 306 lb 6.4 oz (139 kg)   SpO2 98%   BMI 45.25 kg/m  Physical Exam Constitutional:      General: He is not in acute distress.    Appearance: Normal appearance. He is obese. He is not ill-appearing.  HENT:     Mouth/Throat:     Mouth: Mucous membranes are moist.  Pharynx: Oropharynx is clear.     Comments: Mallampati class III Cardiovascular:     Rate and Rhythm: Normal rate and regular rhythm.  Pulmonary:     Breath sounds: No wheezing or rales.     Comments: Diminished Skin:    General: Skin is warm and dry.  Neurological:     General: No focal deficit present.     Mental Status: He is alert and oriented to person, place, and time. Mental status is at baseline.  Psychiatric:        Mood and Affect: Mood normal.        Behavior: Behavior normal.        Thought Content: Thought content normal.        Judgment: Judgment normal.      Lab Results:  CBC    Component Value Date/Time   WBC 7.2 10/07/2018 1447   RBC 4.25 10/07/2018 1447   HGB 14.1 10/07/2018 1447   HCT 42.0 10/07/2018 1447   PLT 198.0 10/07/2018 1447   MCV 98.8 10/07/2018 1447   MCH 32.4 03/20/2017 1514   MCHC 33.5 10/07/2018 1447   RDW 15.2 10/07/2018 1447   LYMPHSABS 3.5 10/07/2018 1447   MONOABS 0.8 10/07/2018 1447   EOSABS 0.2 10/07/2018 1447   BASOSABS 0.0 10/07/2018 1447    BMET    Component Value Date/Time   NA 138 10/07/2018 1447   NA 137 08/27/2017 1637   K 3.4 (L) 10/07/2018 1447   CL 101 10/07/2018 1447   CO2 27 10/07/2018 1447   GLUCOSE 160 (H) 10/07/2018 1447   BUN 14 10/07/2018 1447   BUN 11 08/27/2017 1637   CREATININE 1.14 10/07/2018 1447   CREATININE 1.01 03/20/2017 1514   CALCIUM 8.8  10/07/2018 1447   GFRNONAA 70 08/27/2017 1637   GFRAA 80 08/27/2017 1637    BNP    Component Value Date/Time   BNP 30.3 03/24/2016 1519    ProBNP    Component Value Date/Time   PROBNP 80.0 10/07/2018 1447    Imaging: Dg Chest 2 View  Result Date: 10/13/2018 CLINICAL DATA:  Cough and chest congestion. Coronary artery disease. EXAM: CHEST - 2 VIEW COMPARISON:  10/07/2018 FINDINGS: Mild cardiomegaly and pulmonary vascular congestion are stable. No evidence of pulmonary consolidation or pleural effusion. Prior CABG again noted. IMPRESSION: Stable mild cardiomegaly and pulmonary vascular congestion. No active lung disease. Electronically Signed   By: Marlaine Hind M.D.   On: 10/13/2018 17:07   Dg Chest 2 View  Result Date: 10/08/2018 CLINICAL DATA:  Cough EXAM: CHEST - 2 VIEW COMPARISON:  02/25/2015 FINDINGS: Post sternotomy changes. Cardiomegaly with mild central congestion. Diffuse interstitial opacity. Slight increased left infrahilar opacity. No pneumothorax. IMPRESSION: 1. Cardiomegaly with vascular congestion 2. Increased left infrahilar opacity, possible infiltrate. Short interval radiographic follow-up is suggested. Electronically Signed   By: Donavan Foil M.D.   On: 10/08/2018 03:22     Assessment & Plan:   Obstructive sleep apnea - HST-08/05/2016-AHI 37.5/hour - Reports disrupted sleep, snoring and daytime fatigue - Epworth score 17 - Referral CPAP start, auto titrate 5 to 15 cm H2O - Follow-up in 4 to 6 weeks  Bronchitis - Chronic bronchitis symptoms - No signs of bacterial infection - Recommend mucinex 1,200mg  BID - Needs PFTs    Martyn Ehrich, NP 11/01/2018

## 2018-11-01 NOTE — Assessment & Plan Note (Signed)
-   HST-08/05/2016-AHI 37.5/hour - Reports disrupted sleep, snoring and daytime fatigue - Epworth score 17 - Referral CPAP start, auto titrate 5 to 15 cm H2O - Follow-up in 4 to 6 weeks

## 2018-11-01 NOTE — Assessment & Plan Note (Signed)
-   Chronic bronchitis symptoms - No signs of bacterial infection - Recommend mucinex 1,200mg  BID - Needs PFTs

## 2018-11-01 NOTE — Patient Instructions (Signed)
  RX: - Mucinex 1,200mg  twice daily for chest congestion  Referral: - [facility] new CPAP start (5-15cm h20, mask of choice, humidification and supplies)  Orders: - Needs PFTs  Follow-up - 6 week OSA follow-up with PFTs prior (or same day)

## 2018-11-19 ENCOUNTER — Telehealth: Payer: Self-pay

## 2018-11-19 DIAGNOSIS — I5032 Chronic diastolic (congestive) heart failure: Secondary | ICD-10-CM

## 2018-11-19 NOTE — Telephone Encounter (Signed)
Called patient thought that we were going to send order in to medical supply.

## 2018-11-19 NOTE — Telephone Encounter (Signed)
Copied from [place] [id number]. Topic: General - Other >> Nov 19, 2018  1:19 PM Sheran Luz wrote: Patient requesting call back from [facility] to discuss circulation stockings that were supposed to be ordered at last OV

## 2018-11-21 NOTE — Telephone Encounter (Signed)
DME ordered for compression stockings. May need to fax to store.

## 2018-11-21 NOTE — Addendum Note (Signed)
Addended by: Briscoe Deutscher R on: 11/21/2018 06:57 AM   Modules accepted: Orders

## 2018-11-22 ENCOUNTER — Telehealth: Payer: Self-pay | Admitting: Family Medicine

## 2018-11-22 ENCOUNTER — Telehealth: Payer: Self-pay | Admitting: Internal Medicine

## 2018-11-22 MED ORDER — HYDROCODONE-HOMATROPINE 5-1.5 MG/5ML PO SYRP: 5.0000 mL | ORAL_SOLUTION | Freq: Four times a day (QID) | ORAL | 0 refills | Status: DC | PRN

## 2018-11-22 NOTE — Telephone Encounter (Signed)
See note  Copied from [place] [phone] [phone]. Topic: General - Other >> Nov 19, 2018  1:19 PM Sheran Luz wrote: Patient requesting call back from [facility] to discuss circulation stockings that were supposed to be ordered at last OV

## 2018-11-22 NOTE — Telephone Encounter (Signed)
TE was routed to wrong practice . will route to correct one . Patient called in regards to DME ordered for compression stockings. May need to fax to store.   Please advise  Call back number [phone]

## 2018-11-22 NOTE — Telephone Encounter (Signed)
Hycodan refill e-sent 

## 2018-11-22 NOTE — Telephone Encounter (Signed)
Patient expecting a call in regards to DME ordered for compression stockings. May need to fax to store.   Please advise  Call back number [phone]

## 2018-11-22 NOTE — Telephone Encounter (Signed)
Pt is requesting a refill on Hycodan. This was last refilled by Dr. Annamaria Boots on 09/08/2018 #170mL. Pt's last OV was with Beth on 11/01/2018.  CY - please advise on refill. Thanks.

## 2018-11-22 NOTE — Telephone Encounter (Signed)
Called and spoke w/ pt to let him know his Hycodan prescription has been sent to his preferred pharmacy. Pt verbalized understanding with no additional questions or concerns. Nothing further needed at this time.

## 2018-11-22 NOTE — Telephone Encounter (Signed)
See note

## 2018-11-23 NOTE — Telephone Encounter (Signed)
Called number busy.   I have faxed order to [facility] supply. Patient will need to call them to make time to come in and get measured for them. Contact number for [facility] [phone]. I have faxed order with patient demo to them already.

## 2018-11-23 NOTE — Telephone Encounter (Signed)
See other open message for documentation this is duplicate.

## 2018-11-25 NOTE — Telephone Encounter (Signed)
Number busy

## 2018-11-29 NOTE — Telephone Encounter (Signed)
Called patient gave information will call for appointment.

## 2018-12-03 ENCOUNTER — Other Ambulatory Visit: Payer: Self-pay | Admitting: *Deleted

## 2018-12-03 MED ORDER — EPLERENONE 25 MG PO TABS: 25.0000 mg | ORAL_TABLET | Freq: Every day | ORAL | 2 refills | Status: DC

## 2018-12-10 ENCOUNTER — Other Ambulatory Visit: Payer: Self-pay | Admitting: Physician Assistant

## 2018-12-23 ENCOUNTER — Other Ambulatory Visit: Payer: Self-pay | Admitting: Internal Medicine

## 2018-12-23 DIAGNOSIS — J40 Bronchitis, not specified as acute or chronic: Secondary | ICD-10-CM

## 2018-12-24 ENCOUNTER — Other Ambulatory Visit (HOSPITAL_COMMUNITY)
Admission: RE | Admit: 2018-12-24 | Discharge: 2018-12-24 | Disposition: A | Discharge status: Home / Self Care | Payer: Medicare Other | Source: Ambulatory Visit | Attending: Internal Medicine | Admitting: Internal Medicine

## 2018-12-24 DIAGNOSIS — Z01812 Encounter for preprocedural laboratory examination: Secondary | ICD-10-CM | POA: Insufficient documentation

## 2018-12-24 DIAGNOSIS — Z20828 Contact with and (suspected) exposure to other viral communicable diseases: Secondary | ICD-10-CM | POA: Insufficient documentation

## 2018-12-25 LAB — SARS CORONAVIRUS 2 (TAT 6-24 HRS): SARS Coronavirus 2: NEGATIVE

## 2018-12-27 ENCOUNTER — Telehealth: Payer: Self-pay | Admitting: Internal Medicine

## 2018-12-27 ENCOUNTER — Ambulatory Visit: Payer: Medicare Other | Admitting: Internal Medicine

## 2018-12-27 NOTE — Telephone Encounter (Signed)
Spoke to pt & scheduled covid test.  Nothing further needed. 

## 2019-01-13 ENCOUNTER — Telehealth: Payer: Self-pay | Admitting: Internal Medicine

## 2019-01-13 MED ORDER — HYDROCODONE-HOMATROPINE 5-1.5 MG/5ML PO SYRP: 5.0000 mL | ORAL_SOLUTION | Freq: Four times a day (QID) | ORAL | 0 refills | Status: DC | PRN

## 2019-01-13 NOTE — Telephone Encounter (Signed)
Called and spoke with pt letting him know his hycodan had been refilled and pt verbalized understanding. Nothing further needed.

## 2019-01-13 NOTE — Telephone Encounter (Signed)
Called and spoke with pt. Pt is requesting to have his hycodan cough syrup refilled as this is the only thing that helps with his cough. Dr. Annamaria Boots, please advise if you are okay refilling this for pt.  Allergies  Allergen Reactions  . Ibuprofen Other (See Comments)    Stomach pain     Current Outpatient Medications:  .  ACCU-CHEK AVIVA PLUS test strip, , Disp: , Rfl:  .  albuterol (PROVENTIL HFA;VENTOLIN HFA) 108 (90 Base) MCG/ACT inhaler, Inhale 2 puffs into the lungs every 6 (six) hours as needed for wheezing or shortness of breath., Disp: 1 Inhaler, Rfl: 12 .  albuterol (PROVENTIL) (2.5 MG/3ML) 0.083% nebulizer solution, Take 3 mLs (2.5 mg total) by nebulization every 6 (six) hours as needed for wheezing or shortness of breath., Disp: 75 mL, Rfl: prn .  aspirin EC 81 MG tablet, Take 81 mg by mouth daily. , Disp: , Rfl:  .  B-D ULTRAFINE III SHORT PEN 31G X 8 MM MISC, , Disp: , Rfl: 1 .  diclofenac sodium (VOLTAREN) 1 % GEL, APPLY 2 GRAMS TOPICALLY 4 TIMES DAILY TO KNEE, Disp: 100 g, Rfl: 1 .  eplerenone (INSPRA) 25 MG tablet, Take 1 tablet (25 mg total) by mouth daily., Disp: 90 tablet, Rfl: 2 .  fluticasone (FLONASE) 50 MCG/ACT nasal spray, Place 1 spray into both nostrils daily., Disp: 16 g, Rfl: 12 .  furosemide (LASIX) 20 MG tablet, Take 20 mg by mouth 2 (two) times daily., Disp: , Rfl:  .  guaiFENesin (MUCINEX) 600 MG 12 hr tablet, Take 2 tablets (1,200 mg total) by mouth 2 (two) times daily., Disp: 60 tablet, Rfl: 1 .  HUMALOG KWIKPEN 200 UNIT/ML SOPN, Inject 10-30 Units into the skin 3 (three) times daily with meals. 20 units with smaller meals 30 units with regular meals and 10 units with snacks. Sliding Scale, Disp: , Rfl: 0 .  HYDROcodone-homatropine (HYCODAN) 5-1.5 MG/5ML syrup, Take 5 mLs by mouth every 6 (six) hours as needed for cough., Disp: 120 mL, Rfl: 0 .  ipratropium (ATROVENT) 0.02 % nebulizer solution, Take 2.5 mLs (0.5 mg total) by nebulization every 6 (six) hours as  needed for wheezing or shortness of breath., Disp: 75 mL, Rfl: prn .  ipratropium (ATROVENT) 0.06 % nasal spray, Place 2 sprays into both nostrils 4 (four) times daily., Disp: 15 mL, Rfl: 0 .  Lancets (ACCU-CHEK MULTICLIX) lancets, , Disp: , Rfl:  .  LANTUS SOLOSTAR 100 UNIT/ML Solostar Pen, Inject 67 Units into the skin every morning. , Disp: , Rfl: 0 .  losartan (COZAAR) 50 MG tablet, TAKE 1/2 TABLET BY MOUTH EVERY DAY, Disp: 45 tablet, Rfl: 1 .  MAGNESIUM-OXIDE 400 (241.3 Mg) MG tablet, TAKE 1 TABLET(400 MG) BY MOUTH DAILY, Disp: 90 tablet, Rfl: 2 .  metoprolol tartrate (LOPRESSOR) 50 MG tablet, Take 1 tablet (50 mg total) by mouth 2 (two) times daily., Disp: 180 tablet, Rfl: 3 .  nitroGLYCERIN (NITROSTAT) 0.4 MG SL tablet, Place 1 tablet (0.4 mg total) under the tongue every 5 (five) minutes as needed for chest pain., Disp: 25 tablet, Rfl: 0 .  potassium chloride SA (K-DUR) 20 MEQ tablet, Take 2 tablets (40 mEq total) by mouth 3 (three) times daily., Disp: 240 tablet, Rfl: 11 .  rosuvastatin (CRESTOR) 20 MG tablet, Take 1 tablet (20 mg total) by mouth every evening., Disp: 30 tablet, Rfl: 11 .  torsemide (DEMADEX) 20 MG tablet, Take 1 tablet (20 mg total) by mouth 2 (two)  times daily., Disp: 180 tablet, Rfl: 3

## 2019-01-13 NOTE — Telephone Encounter (Signed)
Hycodan refill e-sent

## 2019-02-01 ENCOUNTER — Other Ambulatory Visit: Payer: Self-pay | Admitting: Family Medicine

## 2019-02-01 NOTE — Telephone Encounter (Signed)
Pt called to see if his medication that is cream for his knee could be refilled. Scheduled him a TOC visit with Dr. [name] on 04/03/2018. Please advise.

## 2019-02-21 ENCOUNTER — Inpatient Hospital Stay (HOSPITAL_COMMUNITY): Admission: RE | Admit: 2019-02-21 | Payer: Medicare Other | Source: Ambulatory Visit

## 2019-02-24 ENCOUNTER — Ambulatory Visit: Payer: Medicare Other | Admitting: Internal Medicine

## 2019-04-01 ENCOUNTER — Telehealth: Payer: Self-pay

## 2019-04-01 NOTE — Telephone Encounter (Signed)
Patient is requesting this his TOC be virtual for Monday because he do not have transportation.

## 2019-04-01 NOTE — Telephone Encounter (Signed)
[place] for virtual,patient aware

## 2019-04-04 ENCOUNTER — Encounter: Payer: Self-pay | Admitting: Family Medicine

## 2019-04-04 ENCOUNTER — Ambulatory Visit (INDEPENDENT_AMBULATORY_CARE_PROVIDER_SITE_OTHER): Payer: Medicare Other | Admitting: Family Medicine

## 2019-04-04 DIAGNOSIS — I2510 Atherosclerotic heart disease of native coronary artery without angina pectoris: Secondary | ICD-10-CM | POA: Diagnosis not present

## 2019-04-04 DIAGNOSIS — E1169 Type 2 diabetes mellitus with other specified complication: Secondary | ICD-10-CM

## 2019-04-04 DIAGNOSIS — E782 Mixed hyperlipidemia: Secondary | ICD-10-CM | POA: Diagnosis not present

## 2019-04-04 DIAGNOSIS — I25708 Atherosclerosis of coronary artery bypass graft(s), unspecified, with other forms of angina pectoris: Secondary | ICD-10-CM

## 2019-04-04 DIAGNOSIS — G4733 Obstructive sleep apnea (adult) (pediatric): Secondary | ICD-10-CM

## 2019-04-04 DIAGNOSIS — I10 Essential (primary) hypertension: Secondary | ICD-10-CM

## 2019-04-04 DIAGNOSIS — Z794 Long term (current) use of insulin: Secondary | ICD-10-CM

## 2019-04-04 MED ORDER — NITROGLYCERIN 0.4 MG SL SUBL: 0.4000 mg | SUBLINGUAL_TABLET | SUBLINGUAL | 0 refills | Status: DC | PRN

## 2019-04-04 NOTE — Assessment & Plan Note (Signed)
Reported blood pressures are at goal.  Continue eplerenone 25 mg daily, losartan 25 mg daily, metoprolol tartrate 50 mg twice daily, torsemide 20 mg twice daily.

## 2019-04-04 NOTE — Progress Notes (Signed)
[name] [name] is a 64 y.o. male who presents today for a virtual office visit. He is transferring care today.   Assessment/Plan:  Chronic Problems Addressed Today: HTN (hypertension) Reported blood pressures are at goal.  Continue eplerenone 25 mg daily, losartan 25 mg daily, metoprolol tartrate 50 mg twice daily, torsemide 20 mg twice daily.  Obstructive sleep apnea Continue management per pulmonology.  HLD (hyperlipidemia) Need lipid panel next blood draw.  He will follow-up in about 6 months to have this done.  We will continue Crestor 20 mg daily.  Type 2 diabetes mellitus with other specified complication ([place]), followed by Dr. Buddy Duty Continue basal bolus regimen per endocrinology.  Coronary artery disease involving native coronary artery of native heart without angina pectoris Continue aspirin and statin per cardiology.    Subjective:  HPI:  His stable, chronic medical conditions are outlined below:   # Essential Hypertension / CAD s/p MI / CHF / PVD / Dyslipidemia - Follows with cardiology - On aspirin 81mg  daily, crestor 20mg  daily, eplerenone 25mg  daily, losartan 25mg  daily, metoprolol tartrate 50mg  twice daily, and torsemide 20mg  twice daily - Home BPs 110s/70s  # T2DM - Follows with endocrinology - Dr Buddy Duty - On basal-bolus with lantus and humalog  # OSA on CPAP - Follows with pulmonology  PMH:  The following were reviewed and entered/updated in epic: Past Medical History:  Diagnosis Date  . Acute myocardial infarction, unspecified site, episode of care unspecified 2005  . Acute pancreatitis   . CAD (coronary artery disease)    a. CABG in 2005 w LIMA to LAD, left radial to second circumflex marginal, saphenous vein graft to PDA, saphenous vein graft to lateral subbrach of ramus intermediate, and sequential saphenous vein graft to the medial subbranch of ramus intermediate.  . Chronic diastolic CHF (congestive heart failure) ([place])   . Cirrhosis of liver  without mention of alcohol   . CKD (chronic kidney disease), stage II   . Complications affecting other specified body systems, hypertension   . Ectopic atrial rhythm   . Essential hypertension   . Hyperlipidemia   . Hypokalemia    a. intermitttent noncompliance with potassium supplement.  . Hypomagnesemia   . Morbid obesity ([place])   . Neuropathy   . Other and unspecified hyperlipidemia   . Proteinuria   . RBBB   . Sleep apnea   . Type II or unspecified type diabetes mellitus without mention of complication, not stated as uncontrolled   . Varicose veins of both lower extremities    Patient Active Problem List   Diagnosis Date Noted  . Bilateral lower extremity edema 10/27/2018  . Type 2 diabetes mellitus with other specified complication ([facility]), followed by Dr. Buddy Duty 10/14/2018  . Chronic heart failure with preserved ejection fraction ([place]) 08/29/2017  . Ectopic atrial rhythm 05/10/2017  . Varicose vein of leg 04/13/2017  . Peripheral vascular disease ([place]) 04/13/2017  . Primary osteoarthritis of both knees 01/21/2017  . Coronary artery disease involving native coronary artery of native heart without angina pectoris 10/29/2016  . Seasonal and perennial allergic rhinitis 07/17/2016  . Ulnar neuritis, left 03/27/2016  . Osteoarthritis of spine with radiculopathy, cervical region 03/27/2016  . PVD (peripheral vascular disease) ([place]) 04/30/2009  . HLD (hyperlipidemia) 01/04/2007  . Hepatic cirrhosis ([place]) 01/04/2007  . Obstructive sleep apnea 01/04/2007  . HTN (hypertension) 01/04/2007   Past Surgical History:  Procedure Laterality Date  . CORONARY ARTERY BYPASS GRAFT     2005  Family History  Problem Relation Age of Onset  . Breast cancer Mother   . Prostate cancer Father   . Coronary artery disease Father        underwent CABG    Medications- reviewed and updated Current Outpatient Medications  Medication Sig Dispense Refill  . ACCU-CHEK AVIVA PLUS test strip     .  albuterol (PROVENTIL HFA;VENTOLIN HFA) 108 (90 Base) MCG/ACT inhaler Inhale 2 puffs into the lungs every 6 (six) hours as needed for wheezing or shortness of breath. 1 Inhaler 12  . albuterol (PROVENTIL) (2.5 MG/3ML) 0.083% nebulizer solution Take 3 mLs (2.5 mg total) by nebulization every 6 (six) hours as needed for wheezing or shortness of breath. 75 mL prn  . aspirin EC 81 MG tablet Take 81 mg by mouth daily.     . B-D ULTRAFINE III SHORT PEN 31G X 8 MM MISC   1  . diclofenac Sodium (VOLTAREN) 1 % GEL APPLY 2 GRAMS TOPICALLY 4 TIMES DAILY TO KNEE 100 g 2  . eplerenone (INSPRA) 25 MG tablet Take 1 tablet (25 mg total) by mouth daily. 90 tablet 2  . fluticasone (FLONASE) 50 MCG/ACT nasal spray Place 1 spray into both nostrils daily. 16 g 12  . furosemide (LASIX) 20 MG tablet Take 20 mg by mouth 2 (two) times daily.    Marland Kitchen guaiFENesin (MUCINEX) 600 MG 12 hr tablet Take 2 tablets (1,200 mg total) by mouth 2 (two) times daily. 60 tablet 1  . HUMALOG KWIKPEN 200 UNIT/ML SOPN Inject 10-30 Units into the skin 3 (three) times daily with meals. 20 units with smaller meals 30 units with regular meals and 10 units with snacks. Sliding Scale  0  . HYDROcodone-homatropine (HYCODAN) 5-1.5 MG/5ML syrup Take 5 mLs by mouth every 6 (six) hours as needed for cough. 120 mL 0  . ipratropium (ATROVENT) 0.02 % nebulizer solution Take 2.5 mLs (0.5 mg total) by nebulization every 6 (six) hours as needed for wheezing or shortness of breath. 75 mL prn  . Lancets (ACCU-CHEK MULTICLIX) lancets     . LANTUS SOLOSTAR 100 UNIT/ML Solostar Pen Inject 67 Units into the skin every morning.   0  . losartan (COZAAR) 50 MG tablet TAKE 1/2 TABLET BY MOUTH EVERY DAY 45 tablet 1  . MAGNESIUM-OXIDE 400 (241.3 Mg) MG tablet TAKE 1 TABLET(400 MG) BY MOUTH DAILY 90 tablet 2  . metoprolol tartrate (LOPRESSOR) 50 MG tablet Take 1 tablet (50 mg total) by mouth 2 (two) times daily. 180 tablet 3  . nitroGLYCERIN (NITROSTAT) 0.4 MG SL tablet Place  1 tablet (0.4 mg total) under the tongue every 5 (five) minutes as needed for chest pain. 25 tablet 0  . potassium chloride SA (K-DUR) 20 MEQ tablet Take 2 tablets (40 mEq total) by mouth 3 (three) times daily. 240 tablet 11  . rosuvastatin (CRESTOR) 20 MG tablet Take 1 tablet (20 mg total) by mouth every evening. 30 tablet 11  . torsemide (DEMADEX) 20 MG tablet Take 1 tablet (20 mg total) by mouth 2 (two) times daily. 180 tablet 3   No current facility-administered medications for this visit.    Allergies-reviewed and updated Allergies  Allergen Reactions  . Ibuprofen Other (See Comments)    Stomach pain    Social History   Socioeconomic History  . Marital status: Significant Other    Spouse name: Not on file  . Number of children: Not on file  . Years of education: Not on file  .  Highest education level: Not on file  Occupational History  . Occupation: Disabled     Comment: previously worked in delivery   Tobacco Use  . Smoking status: Former Smoker    Packs/day: 1.50    Years: 30.00    Pack years: 45.00    Quit date: 10/20/2004    Years since quitting: 14.4  . Smokeless tobacco: Never Used  Substance and Sexual Activity  . Alcohol use: Yes    Comment: occasional  . Drug use: No  . Sexual activity: Not on file  Other Topics Concern  . Not on file  Social History Narrative   Lives with significant other x 30+ years, apartment "friends smoke". Disability. Is disabled secondary to his work-related injury.       Has over 20 grandchildren    Social Determinants of Health   Financial Resource Strain:   . Difficulty of Paying Living Expenses: Not on file  Food Insecurity: No Food Insecurity  . Worried About Charity fundraiser in the Last Year: Never true  . Ran Out of Food in the Last Year: Never true  Transportation Needs:   . Lack of Transportation (Medical): Not on file  . Lack of Transportation (Non-Medical): Not on file  Physical Activity:   . Days of Exercise  per Week: Not on file  . Minutes of Exercise per Session: Not on file  Stress:   . Feeling of Stress : Not on file  Social Connections: Unknown  . Frequency of Communication with Friends and Family: Not on file  . Frequency of Social Gatherings with Friends and Family: Not on file  . Attends Religious Services: Not on file  . Active Member of Clubs or Organizations: Not on file  . Attends Archivist Meetings: Not on file  . Marital Status: Living with partner        Objective/Observations  Physical Exam: Gen: NAD, resting comfortably Pulm: Normal work of breathing Neuro: Grossly normal, moves all extremities Psych: Normal affect and thought content  Virtual Visit via Video   I connected with [name] on 04/04/19 at  2:20 PM EST by a video enabled telemedicine application and verified that I am speaking with the correct person using two identifiers. The limitations of evaluation and management by telemedicine and the availability of in person appointments were discussed. The patient expressed understanding and agreed to proceed.   Patient location: Home Provider location: [place] [facility] Persons participating in the virtual visit: Myself and Patient  Time Spent: 48 minutes of total time was spent on the date of the encounter performing the following actions: chart review prior to seeing the patient, obtaining history, performing a medically necessary exam, counseling on the treatment plan, placing orders, and documenting in our EHR.        Algis Greenhouse. [name], MD 04/04/2019 2:59 PM

## 2019-04-04 NOTE — Assessment & Plan Note (Signed)
Need lipid panel next blood draw.  He will follow-up in about 6 months to have this done.  We will continue Crestor 20 mg daily.

## 2019-04-04 NOTE — Assessment & Plan Note (Signed)
Continue management per pulmonology. 

## 2019-04-04 NOTE — Assessment & Plan Note (Signed)
Continue basal bolus regimen per endocrinology.

## 2019-04-04 NOTE — Assessment & Plan Note (Signed)
Continue aspirin and statin per cardiology.

## 2019-04-05 ENCOUNTER — Telehealth: Payer: Self-pay | Admitting: Internal Medicine

## 2019-04-05 MED ORDER — HYDROCODONE-HOMATROPINE 5-1.5 MG/5ML PO SYRP: 5.0000 mL | ORAL_SOLUTION | Freq: Four times a day (QID) | ORAL | 0 refills | Status: DC | PRN

## 2019-04-05 NOTE — Telephone Encounter (Signed)
Patient is requesting a refill of Hycodan to be sent to [facility] on [address].  Hycodan, take 58mls every 6 hrs as needed for cough, last refill 18ml, no refills, 01/13/2019.   Allergies  Allergen Reactions  . Ibuprofen Other (See Comments)    Stomach pain   Current Outpatient Medications on File Prior to Visit  Medication Sig Dispense Refill  . ACCU-CHEK AVIVA PLUS test strip     . albuterol (PROVENTIL HFA;VENTOLIN HFA) 108 (90 Base) MCG/ACT inhaler Inhale 2 puffs into the lungs every 6 (six) hours as needed for wheezing or shortness of breath. 1 Inhaler 12  . albuterol (PROVENTIL) (2.5 MG/3ML) 0.083% nebulizer solution Take 3 mLs (2.5 mg total) by nebulization every 6 (six) hours as needed for wheezing or shortness of breath. 75 mL prn  . aspirin EC 81 MG tablet Take 81 mg by mouth daily.     . B-D ULTRAFINE III SHORT PEN 31G X 8 MM MISC   1  . diclofenac Sodium (VOLTAREN) 1 % GEL APPLY 2 GRAMS TOPICALLY 4 TIMES DAILY TO KNEE 100 g 2  . eplerenone (INSPRA) 25 MG tablet Take 1 tablet (25 mg total) by mouth daily. 90 tablet 2  . fluticasone (FLONASE) 50 MCG/ACT nasal spray Place 1 spray into both nostrils daily. 16 g 12  . furosemide (LASIX) 20 MG tablet Take 20 mg by mouth 2 (two) times daily.    Marland Kitchen guaiFENesin (MUCINEX) 600 MG 12 hr tablet Take 2 tablets (1,200 mg total) by mouth 2 (two) times daily. 60 tablet 1  . HUMALOG KWIKPEN 200 UNIT/ML SOPN Inject 10-30 Units into the skin 3 (three) times daily with meals. 20 units with smaller meals 30 units with regular meals and 10 units with snacks. Sliding Scale  0  . HYDROcodone-homatropine (HYCODAN) 5-1.5 MG/5ML syrup Take 5 mLs by mouth every 6 (six) hours as needed for cough. 120 mL 0  . ipratropium (ATROVENT) 0.02 % nebulizer solution Take 2.5 mLs (0.5 mg total) by nebulization every 6 (six) hours as needed for wheezing or shortness of breath. 75 mL prn  . Lancets (ACCU-CHEK MULTICLIX) lancets     . LANTUS SOLOSTAR 100 UNIT/ML Solostar Pen  Inject 67 Units into the skin every morning.   0  . losartan (COZAAR) 50 MG tablet TAKE 1/2 TABLET BY MOUTH EVERY DAY 45 tablet 1  . MAGNESIUM-OXIDE 400 (241.3 Mg) MG tablet TAKE 1 TABLET(400 MG) BY MOUTH DAILY 90 tablet 2  . metoprolol tartrate (LOPRESSOR) 50 MG tablet Take 1 tablet (50 mg total) by mouth 2 (two) times daily. 180 tablet 3  . nitroGLYCERIN (NITROSTAT) 0.4 MG SL tablet Place 1 tablet (0.4 mg total) under the tongue every 5 (five) minutes as needed for chest pain. 25 tablet 0  . potassium chloride SA (K-DUR) 20 MEQ tablet Take 2 tablets (40 mEq total) by mouth 3 (three) times daily. 240 tablet 11  . rosuvastatin (CRESTOR) 20 MG tablet Take 1 tablet (20 mg total) by mouth every evening. 30 tablet 11  . torsemide (DEMADEX) 20 MG tablet Take 1 tablet (20 mg total) by mouth 2 (two) times daily. 180 tablet 3  . [DISCONTINUED] simvastatin (ZOCOR) 80 MG tablet Take 80 mg by mouth daily.       No current facility-administered medications on file prior to visit.   Message routed to Dr. Annamaria Boots

## 2019-04-05 NOTE — Telephone Encounter (Signed)
Called the patient and let him know the prescription was sent in by Dr. Annamaria Boots. Patient voiced understanding. Nothing further needed at this time.

## 2019-04-05 NOTE — Telephone Encounter (Signed)
Hycodan refill e-sent

## 2019-04-14 ENCOUNTER — Ambulatory Visit: Payer: Medicare Other | Admitting: Internal Medicine

## 2019-05-12 ENCOUNTER — Other Ambulatory Visit: Payer: Self-pay | Admitting: Physician Assistant

## 2019-05-12 DIAGNOSIS — I10 Essential (primary) hypertension: Secondary | ICD-10-CM

## 2019-05-17 ENCOUNTER — Telehealth: Payer: Self-pay | Admitting: Internal Medicine

## 2019-05-17 NOTE — Telephone Encounter (Signed)
Spoke with pt. He is requesting a refill on Hycodan. This was last filled by Dr. Annamaria Boots on 04/05/19 #140mL. Pt's last OV was with CY in 10/2018. Pt has a pending OV with CY in May. He is aware that CY will not be back in the office until Thursday.  CY - please advise. Thanks.

## 2019-05-20 MED ORDER — HYDROCODONE-HOMATROPINE 5-1.5 MG/5ML PO SYRP: 5.0000 mL | ORAL_SOLUTION | Freq: Four times a day (QID) | ORAL | 0 refills | Status: DC | PRN

## 2019-05-20 NOTE — Telephone Encounter (Signed)
Pt calling back about the status on the Hycodan. Pt's pharmacy of choice [facility] on [address]. Pt can be reached [phone].

## 2019-05-20 NOTE — Telephone Encounter (Signed)
ATC patient, LMTCB.  Dr. Annamaria Boots please advise on this refill request that was sent on 05/17/19.

## 2019-05-20 NOTE — Telephone Encounter (Signed)
Hycodan refill e-sent

## 2019-05-20 NOTE — Telephone Encounter (Signed)
I called and spoke with the patient and made him aware that his refill has been sent to the pharmacy. Nothing further is needed.

## 2019-05-30 ENCOUNTER — Telehealth: Payer: Self-pay | Admitting: *Deleted

## 2019-05-30 NOTE — Telephone Encounter (Signed)
Left pt a message re: appt 06/03/2019.  Would like for pt to come in office instead of virtual. Dayna will be in office all day.

## 2019-06-01 NOTE — Telephone Encounter (Signed)
Called pt back.  He has agreed to come in office for his appt 06/03/2019 to see Melina Copa, PA-C.  Pt aware to arrive at 11:30 for registration.

## 2019-06-02 ENCOUNTER — Ambulatory Visit: Payer: Medicare Other | Admitting: Orthopedic Surgery

## 2019-06-02 NOTE — Progress Notes (Signed)
Cardiology Office Note    Date:  06/03/2019   ID:  KYRONE WITTIG, DOB 10-14-1955, MRN [id number]  PCP:  [name], MD  Cardiologist:  Candee Furbish, MD  Electrophysiologist:  None   Chief Complaint: f/u CAD, CHF  History of Present Illness:   [name] [name] is a 64 y.o. 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 Annamaria Boots) who presents for routine follow-up.  Overall he has done well since bypass with several nuclear stress tests since then, with last nuclear stress test in 2015 that was normal, non-gated. 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 He stated his father prayed over his liver for 12 hours straight and he has been told it has been healed ever since then. He has not touched alcohol since. He has been followed by [facility] for chronic edema/venous reflux with recommendation for compression stockings. He saw PCP in 2019 with complaints of intermittent heart racing and chest tightness. EKG appeared to show NSR with irregular rhythm, intermittent ectopic atrial beating, and chronic RBBB. (The computer interpretation was Afib/flutter but per review with cardiology MD, not felt to be the case.) He was seen in cardiology clinic 04/2017 at which time rhythm strips continued to show runs of what appeared to be an low ectopic atrial rhythm (just shy of tachycardic) upon review by Dr. Curt Bears. His metoprolol was increased. He was also found to be hypokalemic and hypomagnesemic requiring supplementation. This has been a moving target at times as he doesn't  always take his supplementation as prescribed. 2D echo 05/07/17 showed EF [id number], normal diastolic parameters, mild MR, mildly dilated RV, moderately dilated RA, poor acoustic windows. Last labs personally reviewed 09/2018 LDL 71 (followed by PCP), TSH wnl, BNP wnl, Hgb 14.1, Plt 198, K 3.4, Cr 1.14, LFTs ok, trig 232, Mg 1.5.  He is seen back for follow-up and overall reports he's feeling stable from cardiac standpoint without chest pain but does report recurrent edema.He says his stockings from [facility] broke down and he tried to call them a year ago to discuss recommendations for replacement but had issues at that time. As a result he says his legs started swelling back. In June 2020 at his vascular appointment this was listed as 2+ non pitting edema in both lower legs and feet, 1+ pitting edema on both feet. He was measured for hose at that time and it was provided to him. They have also been managing his PAD. He has to open his shoes up wider to fit in them. He has chronic unchanged dyspnea with higher levels of activity. He reports taking torsemide as directed. He is only taking his potassium twice a day instead of TID as prescribed. He uses CPAP. He denies any orthopnea or palpitations. Weight is on a slow creep upward. He drinks a lot of regular sodas daily and likes to eat potato chips at night. We discussed sodium/fluid restriction today.   Past Medical History:  Diagnosis Date  . Acute myocardial infarction, unspecified site, episode of care unspecified 2005  . Acute pancreatitis   . CAD (coronary artery disease)  a. CABG in 2005 w LIMA to LAD, left radial to second circumflex marginal, saphenous vein graft to PDA, saphenous vein graft to lateral subbrach of ramus intermediate, and sequential saphenous vein graft to the medial subbranch of ramus intermediate.  . Chronic diastolic CHF (congestive heart failure) ([place])   . Cirrhosis of liver without mention of alcohol   . CKD (chronic kidney  disease), stage II   . Complications affecting other specified body systems, hypertension   . Ectopic atrial rhythm   . Essential hypertension   . Hyperlipidemia   . Hypokalemia    a. intermitttent noncompliance with potassium supplement.  . Hypomagnesemia   . Morbid obesity ([place])   . Neuropathy   . Other and unspecified hyperlipidemia   . Proteinuria   . RBBB   . Sleep apnea   . Type II or unspecified type diabetes mellitus without mention of complication, not stated as uncontrolled   . Varicose veins of both lower extremities     Past Surgical History:  Procedure Laterality Date  . CORONARY ARTERY BYPASS GRAFT     2005    Current Medications: Current Meds  Medication Sig  . ACCU-CHEK AVIVA PLUS test strip   . albuterol (PROVENTIL HFA;VENTOLIN HFA) 108 (90 Base) MCG/ACT inhaler Inhale 2 puffs into the lungs every 6 (six) hours as needed for wheezing or shortness of breath.  Marland Kitchen albuterol (PROVENTIL) (2.5 MG/3ML) 0.083% nebulizer solution Take 3 mLs (2.5 mg total) by nebulization every 6 (six) hours as needed for wheezing or shortness of breath.  Marland Kitchen aspirin EC 81 MG tablet Take 81 mg by mouth daily.   . B-D ULTRAFINE III SHORT PEN 31G X 8 MM MISC   . diclofenac Sodium (VOLTAREN) 1 % GEL APPLY 2 GRAMS TOPICALLY 4 TIMES DAILY TO KNEE  . eplerenone (INSPRA) 25 MG tablet Take 1 tablet (25 mg total) by mouth daily.  . fluticasone (FLONASE) 50 MCG/ACT nasal spray Place 1 spray into both nostrils daily.  Marland Kitchen guaiFENesin (MUCINEX) 600 MG 12 hr tablet Take 2 tablets (1,200 mg total) by mouth 2 (two) times daily.  Marland Kitchen HYDROcodone-homatropine (HYCODAN) 5-1.5 MG/5ML syrup Take 5 mLs by mouth every 6 (six) hours as needed for cough.  Marland Kitchen ipratropium (ATROVENT) 0.02 % nebulizer solution Take 2.5 mLs (0.5 mg total) by nebulization every 6 (six) hours as needed for wheezing or shortness of breath.  . Lancets (ACCU-CHEK MULTICLIX) lancets   . losartan (COZAAR) 50 MG tablet TAKE 1/2 TABLET BY MOUTH EVERY  DAY  . MAGNESIUM-OXIDE 400 (241.3 Mg) MG tablet TAKE 1 TABLET(400 MG) BY MOUTH DAILY  . metoprolol tartrate (LOPRESSOR) 50 MG tablet Take 1 tablet (50 mg total) by mouth 2 (two) times daily.  . nitroGLYCERIN (NITROSTAT) 0.4 MG SL tablet Place 1 tablet (0.4 mg total) under the tongue every 5 (five) minutes as needed for chest pain.  . potassium chloride SA (K-DUR) 20 MEQ tablet Take 2 tablets (40 mEq total) by mouth 3 (three) times daily.  . rosuvastatin (CRESTOR) 20 MG tablet Take 1 tablet (20 mg total) by mouth every evening.  . torsemide (DEMADEX) 20 MG tablet Take 1 tablet (20 mg total) by mouth 2 (two) times daily.  . [DISCONTINUED] furosemide (LASIX) 20 MG tablet Take 20 mg by mouth 2 (two) times daily.   Allergies:   Ibuprofen   Social History   Socioeconomic History  . Marital status: Significant Other    Spouse name: Not on file  . Number of children: Not  on file  . Years of education: Not on file  . Highest education level: Not on file  Occupational History  . Occupation: Disabled     Comment: previously worked in delivery   Tobacco Use  . Smoking status: Former Smoker    Packs/day: 1.50    Years: 30.00    Pack years: 45.00    Quit date: 10/20/2004    Years since quitting: 14.6  . Smokeless tobacco: Never Used  Substance and Sexual Activity  . Alcohol use: Yes    Comment: occasional  . Drug use: No  . Sexual activity: Not on file  Other Topics Concern  . Not on file  Social History Narrative   Lives with significant other x 30+ years, apartment "friends smoke". Disability. Is disabled secondary to his work-related injury.       Has over 20 grandchildren    Social Determinants of Health   Financial Resource Strain:   . Difficulty of Paying Living Expenses:   Food Insecurity: No Food Insecurity  . Worried About Charity fundraiser in the Last Year: Never true  . Ran Out of Food in the Last Year: Never true  Transportation Needs:   . Lack of Transportation  (Medical):   Marland Kitchen Lack of Transportation (Non-Medical):   Physical Activity:   . Days of Exercise per Week:   . Minutes of Exercise per Session:   Stress:   . Feeling of Stress :   Social Connections: Unknown  . Frequency of Communication with Friends and Family: Not on file  . Frequency of Social Gatherings with Friends and Family: Not on file  . Attends Religious Services: Not on file  . Active Member of Clubs or Organizations: Not on file  . Attends Archivist Meetings: Not on file  . Marital Status: Living with partner     Family History:  The patient's family history includes Breast cancer in his mother; Coronary artery disease in his father; Prostate cancer in his father.  ROS:   Please see the history of present illness. He also reports he twisted his right ankle the other day and has an ortho appointment pending - has had prior interventions on this ankle. All other systems are reviewed and otherwise negative.    EKGs/Labs/Other Studies Reviewed:    Studies reviewed are outlined and summarized above. Reports included below if pertinent.  2D echo 04/2017 Study Conclusions   - Left ventricle: The cavity size was normal. Wall thickness was  normal. Systolic function was normal. The estimated ejection  fraction was in the range of 50% to 55%. Left ventricular  diastolic function parameters were normal.  - Mitral valve: There was mild regurgitation.  - Right ventricle: The cavity size was mildly dilated.  - Right atrium: The atrium was moderately dilated.   Impressions:   - Poor acoustic windows limit study even with use of Definity.     EKG:  EKG is ordered today, personally reviewed, demonstrating Probable NSR interspersed with a separate ectopic atrial rhythm, 85bpm with baseline RBBB, nonspecific STT changes  Recent Labs: 10/07/2018: ALT 11; BUN 14; Creatinine, Ser 1.14; Hemoglobin 14.1; Magnesium 1.5; Platelets 198.0; Potassium 3.4; Pro B Natriuretic  peptide (BNP) 80.0; Sodium 138; TSH 3.06  Recent Lipid Panel    Component Value Date/Time   CHOL 135 10/07/2018 1447   TRIG 232.0 (H) 10/07/2018 1447   HDL 45.30 10/07/2018 1447   CHOLHDL 3 10/07/2018 1447   VLDL 46.4 (H) 10/07/2018 1447  [place] 80 01/08/2010 0837   LDLDIRECT 71.0 10/07/2018 1447    PHYSICAL EXAM:    VS:  BP 112/68   Pulse 85   Ht 5\' 9"  (1.753 m)   Wt (!) 308 lb 1.9 oz (139.8 kg)   SpO2 95%   BMI 45.50 kg/m   BMI: Body mass index is 45.5 kg/m.  GEN: Well nourished, well developed obese AAM, in no acute distress HEENT: normocephalic, atraumatic Neck: no JVD, carotid bruits, or masses Cardiac: RRR; no murmurs, rubs, or gallops, 2+ stalk like bilateral LE edema, tense/tight Respiratory:  clear to auscultation bilaterally, normal work of breathing GI: soft, nontender, nondistended, + BS MS: no deformity or atrophy Skin: warm and dry, generally taut, no rash Neuro:  Alert and Oriented x 3, Strength and sensation are intact, follows commands Psych: euthymic mood, full affect  Wt Readings from Last 3 Encounters:  06/03/19 (!) 308 lb 1.9 oz (139.8 kg)  11/01/18 (!) 306 lb 6.4 oz (139 kg)  10/25/18 (!) 307 lb 6.4 oz (139.4 kg)     ASSESSMENT & PLAN:   1. CAD - stable, no recent accelerating symptoms. Continue ASA, BB, statin. Lipids are most recently followed by primary care. Last LDL right around goal at 71. With potential prior liver issues he has not been on higher intensity statin. We did discuss the impact of diet/lifestyle on his overall cardiac health. I suggested referral to [facility] but he politely declined. 2. Lower extremity edema, with chronic diastolic CHF and chronic venous reflux - significant edema noted on exam. The patient reports in the past when wearing his compression stockings his feet would go back to near normal. He was fitted for these in 07/2018 per [facility] note. They have also been taking into account his PAD. He  had some issues with refilling the stockings. He hasn't tried calling them in almost a year to discuss so suspect his edema has been steadily increasing since that time. He will call their office after today's visit to discuss a plan for getting back on their recommended stockings. This will be the mainstay of therapy for his edema. In the interim he may benefit from increased diuretic therapy but this has traditionally been very tricky given his issues with hypokalemia and intermittent lapsed compliance with potassium. Will check CMET, Mg, BNP today. Could consider uptitration of epleronone if felt safe from potassium standpoint. Reviewed 2g sodium restriction, 2L fluid restriction, daily weights with patient. If his edema does not improve with these measures may need to consider evaluation for amyloid given his diastolic CHF and arrhythmia history. Appreciate Dr. Marlou Porch' input on this when he sees him back in follow-up. 3. Ectopic atrial tachycardia - seen again on EKG today, not overtly symptomatic. Continue metoprolol and follow up labs. 4. Essential HTN - BP controlled. If we increased diuretic, could consider decreasing losartan. 5. Morbid obesity - discussed referral to [facility] but patient declines. He states he knows what changes he has to make, just has to enact them.  Disposition: F/u with Dr. Marlou Porch in 2 weeks to recheck edema and ensure on the way to improving. (I had no clinic days in necessary timeframe.)  Medication Adjustments/Labs and Tests Ordered: Current medicines are reviewed at length with the patient today.  Concerns regarding medicines are outlined above. Medication changes, Labs and Tests ordered today are summarized above and listed in the Patient Instructions accessible in Encounters.   Signed, Charlie Pitter, PA-C  06/03/2019 12:42 PM  [facility] [place], [place], [place]  [address] Phone: [phone]; Fax: [phone]

## 2019-06-03 ENCOUNTER — Ambulatory Visit (INDEPENDENT_AMBULATORY_CARE_PROVIDER_SITE_OTHER): Payer: Medicare Other | Admitting: Physician Assistant

## 2019-06-03 ENCOUNTER — Other Ambulatory Visit: Payer: Self-pay

## 2019-06-03 ENCOUNTER — Encounter: Payer: Self-pay | Admitting: Physician Assistant

## 2019-06-03 VITALS — BP 112/68 | HR 85 | Ht 69.0 in | Wt 308.1 lb

## 2019-06-03 DIAGNOSIS — I471 Supraventricular tachycardia: Secondary | ICD-10-CM

## 2019-06-03 DIAGNOSIS — R600 Localized edema: Secondary | ICD-10-CM | POA: Diagnosis not present

## 2019-06-03 DIAGNOSIS — I10 Essential (primary) hypertension: Secondary | ICD-10-CM

## 2019-06-03 DIAGNOSIS — I5032 Chronic diastolic (congestive) heart failure: Secondary | ICD-10-CM | POA: Diagnosis not present

## 2019-06-03 DIAGNOSIS — I2510 Atherosclerotic heart disease of native coronary artery without angina pectoris: Secondary | ICD-10-CM | POA: Diagnosis not present

## 2019-06-03 DIAGNOSIS — I4719 Other supraventricular tachycardia: Secondary | ICD-10-CM

## 2019-06-03 DIAGNOSIS — E782 Mixed hyperlipidemia: Secondary | ICD-10-CM

## 2019-06-03 NOTE — Patient Instructions (Addendum)
Medication Instructions:  Your physician recommends that you continue on your current medications as directed. Please refer to the Current Medication list given to you today.  *If you need a refill on your cardiac medications before your next appointment, please call your pharmacy*   Lab Work: TODAY:  CMET, MAG, & PRO BNP  If you have labs (blood work) drawn today and your tests are completely normal, you will receive your results only by: Marland Kitchen MyChart Message (if you have MyChart) OR . A paper copy in the mail If you have any lab test that is abnormal or we need to change your treatment, we will call you to review the results.   Testing/Procedures: None ordered   Follow-Up: At [facility], you and your health needs are our priority.  As part of our continuing mission to provide you with exceptional heart care, we have created designated Provider Care Teams.  These Care Teams include your primary Cardiologist (physician) and Advanced Practice Providers (APPs -  Physician Assistants and Nurse Practitioners) who all work together to provide you with the care you need, when you need it.  We recommend signing up for the patient portal called "MyChart".  Sign up information is provided on this After Visit Summary.  MyChart is used to connect with patients for Virtual Visits (Telemedicine).  Patients are able to view lab/test results, encounter notes, upcoming appointments, etc.  Non-urgent messages can be sent to your provider as well.   To learn more about what you can do with MyChart, go to [email or web address].    Your next appointment:   2 WEEKS 06/17/2019  The format for your next appointment:   In Person  Provider:   You may see Candee Furbish, MD or one of the following Advanced Practice Providers on your designated Care Team:    Truitt Merle, NP  Cecilie Kicks, NP  Kathyrn Drown, NP    Other Instructions

## 2019-06-04 LAB — COMPREHENSIVE METABOLIC PANEL
ALT: 9 IU/L (ref 0–44)
AST: 14 IU/L (ref 0–40)
Albumin/Globulin Ratio: 0.8 — ABNORMAL LOW (ref 1.2–2.2)
Albumin: 3.8 g/dL (ref 3.8–4.8)
Alkaline Phosphatase: 90 IU/L (ref 39–117)
BUN/Creatinine Ratio: 12 (ref 10–24)
BUN: 13 mg/dL (ref 8–27)
Bilirubin Total: 0.5 mg/dL (ref 0.0–1.2)
CO2: 29 mmol/L (ref 20–29)
Calcium: 9.3 mg/dL (ref 8.6–10.2)
Chloride: 96 mmol/L (ref 96–106)
Creatinine, Ser: 1.11 mg/dL (ref 0.76–1.27)
GFR calc Af Amer: 80 mL/min/{1.73_m2} (ref >59)
GFR calc non Af Amer: 69 mL/min/{1.73_m2} (ref >59)
Globulin, Total: 4.5 g/dL (ref 1.5–4.5)
Glucose: 191 mg/dL — ABNORMAL HIGH (ref 65–99)
Potassium: 3.4 mmol/L — ABNORMAL LOW (ref 3.5–5.2)
Sodium: 139 mmol/L (ref 134–144)
Total Protein: 8.3 g/dL (ref 6.0–8.5)

## 2019-06-04 LAB — PRO B NATRIURETIC PEPTIDE: NT-Pro BNP: 367 pg/mL (ref 0–376)

## 2019-06-04 LAB — MAGNESIUM: Magnesium: 1.4 mg/dL — ABNORMAL LOW (ref 1.6–2.3)

## 2019-06-06 ENCOUNTER — Ambulatory Visit (INDEPENDENT_AMBULATORY_CARE_PROVIDER_SITE_OTHER): Payer: Medicare Other | Admitting: Orthopedic Surgery

## 2019-06-06 ENCOUNTER — Other Ambulatory Visit: Payer: Self-pay

## 2019-06-06 ENCOUNTER — Ambulatory Visit: Payer: Self-pay

## 2019-06-06 ENCOUNTER — Encounter: Payer: Self-pay | Admitting: Orthopedic Surgery

## 2019-06-06 ENCOUNTER — Telehealth: Payer: Self-pay | Admitting: *Deleted

## 2019-06-06 VITALS — Ht 69.0 in | Wt 308.0 lb

## 2019-06-06 DIAGNOSIS — M79642 Pain in left hand: Secondary | ICD-10-CM

## 2019-06-06 DIAGNOSIS — Z79899 Other long term (current) drug therapy: Secondary | ICD-10-CM

## 2019-06-06 DIAGNOSIS — M5412 Radiculopathy, cervical region: Secondary | ICD-10-CM

## 2019-06-06 DIAGNOSIS — M25571 Pain in right ankle and joints of right foot: Secondary | ICD-10-CM

## 2019-06-06 DIAGNOSIS — M79641 Pain in right hand: Secondary | ICD-10-CM | POA: Diagnosis not present

## 2019-06-06 DIAGNOSIS — M19171 Post-traumatic osteoarthritis, right ankle and foot: Secondary | ICD-10-CM | POA: Diagnosis not present

## 2019-06-06 MED ORDER — PREDNISONE 10 MG PO TABS: 20.0000 mg | ORAL_TABLET | Freq: Every day | ORAL | 0 refills | Status: DC

## 2019-06-06 MED ORDER — MAGNESIUM OXIDE -MG SUPPLEMENT 400 (240 MG) MG PO TABS: 400.0000 mg | ORAL_TABLET | Freq: Two times a day (BID) | ORAL | 11 refills | Status: DC

## 2019-06-06 NOTE — Progress Notes (Signed)
Office Visit Note   Patient: [name] [name]           Date of Birth: Nov 07, 1955           MRN: [id number] Visit Date: 06/06/2019              Requested by: [name], MD [address] [place],   [address] PCP: [name], MD  Chief Complaint  Patient presents with  . Right Ankle - Pain  . Left Hand - Pain  . Right Hand - Pain      HPI: Patient is a 63 year old gentleman who presents with 3 separate issues.  #1 chronic pain of the right ankle posttraumatic he states that he broke his ankle in 2001 when he fell on ice he states that at the time they did not want to do surgical intervention due to his diabetes.  Patient complains of persistent ankle pain.  #2 patient complains of radicular pain in the lateral aspect the left hand in the ulnar distribution with neck pain that radiates down to his hand.  #3 patient states he has been having some triggering of the right ring finger but he states that it is not triggering at this time he is right-hand dominant.  Assessment & Plan: Visit Diagnoses:  1. Pain in right ankle and joints of right foot   2. Bilateral hand pain   3. Left cervical radiculopathy   4. Post-traumatic osteoarthritis, right ankle and foot     Plan: Discussed that we will try low-dose prednisone he will start with 20 mg with breakfast and wean down to 10 mg as he feels better and then wean off.  Discussed that he will need to check his glucose as this will increase his glucose.  We will apply a ankle stabilizing orthosis and a postoperative shoe for the right ankle.  Follow-Up Instructions: Return in about 4 weeks (around 07/04/2019).   Ortho Exam  Patient is alert, oriented, no adenopathy, well-dressed, normal affect, normal respiratory effort. Examination patient does have venous swelling but no ulcers he does have a palpable pulse right ankle has range of motion from 0 degrees of dorsiflexion to about 20 degrees of plantarflexion there is  essentially no subtalar motion as well.  Examination the right hand he has no triggering at this time he does have Heberden and Bouchard's nodes with arthritic changes.  Semination the left hand he has burning pain in the ulnar nerve distribution negative Tinel's sign over the ulnar nerve at the elbow he does have thoracic outlet tenderness on the left side.  Imaging: XR Ankle Complete Right  Result Date: 06/06/2019 2 view radiographs of the right ankle shows varus collapse of the tibiotalar joint with shortening of the fibula and calcification of the syndesmosis with osteophytic bone spurs at the talonavicular and navicular medial cuneiform joint.  XR Hand Complete Left  Result Date: 06/06/2019 2 view radiographs of the left hand shows arthritic changes throughout the joints calcification the metaphyseal radiation of the distal radius with staples from his radial artery harvesting  XR Hand Complete Right  Result Date: 06/06/2019 2 view radiographs of the right hand shows some metaphyseal calcification in the distal radius with arthritic changes throughout the joints.  No images are attached to the encounter.  Labs: Lab Results  Component Value Date   HGBA1C 10.3 (H) 10/07/2018   HGBA1C 9.1 06/23/2018   HGBA1C 9.9 06/18/2016     Lab Results  Component Value Date  ALBUMIN 3.8 06/03/2019   ALBUMIN 3.6 10/07/2018   ALBUMIN 3.9 03/24/2016    Lab Results  Component Value Date   MG 1.4 (L) 06/03/2019   MG 1.5 10/07/2018   MG 2.1 08/27/2017   No results found for: VD25OH  No results found for: PREALBUMIN CBC EXTENDED Latest Ref Rng & Units 10/07/2018 03/20/2017 02/25/2015  WBC 4.0 - 10.5 K/uL 7.2 6.8 13.7(H)  RBC 4.22 - 5.81 Mil/uL 4.25 4.60 4.75  HGB 13.0 - 17.0 g/dL 14.1 14.9 15.7  HCT 39.0 - 52.0 % 42.0 41.1 46.9  PLT 150.0 - 400.0 K/uL 198.0 258 200  NEUTROABS 1.4 - 7.7 K/uL 2.7 2,196 11.4(H)  LYMPHSABS 0.7 - 4.0 K/uL 3.5 3,692 1.1     Body mass index is 45.48  kg/m.  Orders:  Orders Placed This Encounter  Procedures  . XR Ankle Complete Right  . XR Hand Complete Right  . XR Hand Complete Left   No orders of the defined types were placed in this encounter.    Procedures: No procedures performed  Clinical Data: No additional findings.  ROS:  All other systems negative, except as noted in the HPI. Review of Systems  Objective: Vital Signs: Ht 5\' 9"  (1.753 m)   Wt (!) 308 lb (139.7 kg)   BMI 45.48 kg/m   Specialty Comments:  No specialty comments available.  PMFS History: Patient Active Problem List   Diagnosis Date Noted  . Bilateral lower extremity edema 10/27/2018  . Type 2 diabetes mellitus with other specified complication ([place]), followed by Dr. Buddy Duty 10/14/2018  . Chronic heart failure with preserved ejection fraction ([place]) 08/29/2017  . Ectopic atrial rhythm 05/10/2017  . Varicose vein of leg 04/13/2017  . Peripheral vascular disease ([place]) 04/13/2017  . Primary osteoarthritis of both knees 01/21/2017  . Coronary artery disease involving native coronary artery of native heart without angina pectoris 10/29/2016  . Seasonal and perennial allergic rhinitis 07/17/2016  . Ulnar neuritis, left 03/27/2016  . Osteoarthritis of spine with radiculopathy, cervical region 03/27/2016  . PVD (peripheral vascular disease) ([place]) 04/30/2009  . HLD (hyperlipidemia) 01/04/2007  . Hepatic cirrhosis ([place]) 01/04/2007  . Obstructive sleep apnea 01/04/2007  . HTN (hypertension) 01/04/2007   Past Medical History:  Diagnosis Date  . Acute myocardial infarction, unspecified site, episode of care unspecified 2005  . Acute pancreatitis   . CAD (coronary artery disease)    a. CABG in 2005 w LIMA to LAD, left radial to second circumflex marginal, saphenous vein graft to PDA, saphenous vein graft to lateral subbrach of ramus intermediate, and sequential saphenous vein graft to the medial subbranch of ramus intermediate.  . Chronic diastolic CHF  (congestive heart failure) ([place])   . Cirrhosis of liver without mention of alcohol   . CKD (chronic kidney disease), stage II   . Complications affecting other specified body systems, hypertension   . Ectopic atrial rhythm   . Essential hypertension   . Hyperlipidemia   . Hypokalemia    a. intermitttent noncompliance with potassium supplement.  . Hypomagnesemia   . Morbid obesity ([place])   . Neuropathy   . Other and unspecified hyperlipidemia   . Proteinuria   . RBBB   . Sleep apnea   . Type II or unspecified type diabetes mellitus without mention of complication, not stated as uncontrolled   . Varicose veins of both lower extremities     Family History  Problem Relation Age of Onset  . Breast cancer Mother   .  Prostate cancer Father   . Coronary artery disease Father        underwent CABG    Past Surgical History:  Procedure Laterality Date  . CORONARY ARTERY BYPASS GRAFT     2005   Social History   Occupational History  . Occupation: Disabled     Comment: previously worked in delivery   Tobacco Use  . Smoking status: Former Smoker    Packs/day: 1.50    Years: 30.00    Pack years: 45.00    Quit date: 10/20/2004    Years since quitting: 14.6  . Smokeless tobacco: Never Used  Substance and Sexual Activity  . Alcohol use: Yes    Comment: occasional  . Drug use: No  . Sexual activity: Not on file

## 2019-06-06 NOTE — Telephone Encounter (Signed)
-----   Message from Charlie Pitter, [place] sent at 06/06/2019  8:05 AM EDT ----- Please let pt know that his electrolytes still look to be very low. Because of this I do not want to adjust his diuretics yet because we need to see them coming up. At recent [place] he reported he was only taking potassium BID. Please increase KCL to 5meq TID as prescribed. Please increase MagOx to 400mg  BID. In the meantime, needs to restrict ALL FLUID INTAKE including sodas + water + tea + ANY LIQUID to no more than 48 oz per day.  Restrict sodium to 2000mg  per day Recheck potassium/magnesium only on Thursday AM. Please make sure he called the vein/vascular people to discuss compression hose because I do not see a phone note to their office. Thanks!

## 2019-06-09 ENCOUNTER — Other Ambulatory Visit: Payer: Medicare Other

## 2019-06-11 ENCOUNTER — Inpatient Hospital Stay (HOSPITAL_COMMUNITY): Admission: RE | Admit: 2019-06-11 | Payer: Medicare Other | Source: Ambulatory Visit

## 2019-06-11 ENCOUNTER — Other Ambulatory Visit: Payer: Self-pay | Admitting: Physician Assistant

## 2019-06-16 NOTE — Progress Notes (Deleted)
Cardiology Office Note:    Date:  06/16/2019   ID:  [name] [name], DOB May 06, 1955, MRN [id number]  PCP:  [name] Barrack, MD  Cardiologist:  [name] Furbish, MD  Electrophysiologist:  None   Referring MD: [name] Barrack, MD     History of Present Illness:    [name] [name] is a 64 y.o. male here for the follow-up of coronary artery disease status post CABG in 2005 with comorbidities of morbid obesity chronic diastolic heart failure ectopic atrial tachycardia right bundle branch block cirrhosis obstructive sleep apnea followed by Dr. Annamaria [name].  Back in 2015 nuclear stress test was performed and was low risk.  Prior cardiology rhythm strips demonstrated ectopic atrial rhythm.  Dr. Curt [name] previously reviewed.  EF has been low normal.  Past Medical History:  Diagnosis Date  . Acute myocardial infarction, unspecified site, episode of care unspecified 2005  . Acute pancreatitis   . CAD (coronary artery disease)    a. CABG in 2005 w LIMA to LAD, left radial to second circumflex marginal, saphenous vein graft to PDA, saphenous vein graft to lateral subbrach of ramus intermediate, and sequential saphenous vein graft to the medial subbranch of ramus intermediate.  . Chronic diastolic CHF (congestive heart failure) ([place])   . Cirrhosis of liver without mention of alcohol   . CKD (chronic kidney disease), stage II   . Complications affecting other specified body systems, hypertension   . Ectopic atrial rhythm   . Essential hypertension   . Hyperlipidemia   . Hypokalemia    a. intermitttent noncompliance with potassium supplement.  . Hypomagnesemia   . Morbid obesity ([name])   . Neuropathy   . Other and unspecified hyperlipidemia   . Proteinuria   . RBBB   . Sleep apnea   . Type II or unspecified type diabetes mellitus without mention of complication, not stated as uncontrolled   . Varicose veins of both lower extremities     Past Surgical History:  Procedure Laterality Date  .  CORONARY ARTERY BYPASS GRAFT     2005    Current Medications: No outpatient medications have been marked as taking for the 06/17/19 encounter (Appointment) with [name], MD.     Allergies:   Ibuprofen   Social History   Socioeconomic History  . Marital status: Significant Other    Spouse name: Not on file  . Number of children: Not on file  . Years of education: Not on file  . Highest education level: Not on file  Occupational History  . Occupation: Disabled     Comment: previously worked in delivery   Tobacco Use  . Smoking status: Former Smoker    Packs/day: 1.50    Years: 30.00    Pack years: 45.00    Quit date: 10/20/2004    Years since quitting: 14.6  . Smokeless tobacco: Never Used  Substance and Sexual Activity  . Alcohol use: Yes    Comment: occasional  . Drug use: No  . Sexual activity: Not on file  Other Topics Concern  . Not on file  Social History Narrative   Lives with significant other x 30+ years, apartment "friends smoke". Disability. Is disabled secondary to his work-related injury.       Has over 20 grandchildren    Social Determinants of Health   Financial Resource Strain:   . Difficulty of Paying Living Expenses:   Food Insecurity: No Food Insecurity  . Worried About Charity fundraiser in  the Last Year: Never true  . Ran Out of Food in the Last Year: Never true  Transportation Needs:   . Lack of Transportation (Medical):   Marland Kitchen Lack of Transportation (Non-Medical):   Physical Activity:   . Days of Exercise per Week:   . Minutes of Exercise per Session:   Stress:   . Feeling of Stress :   Social Connections: Unknown  . Frequency of Communication with Friends and Family: Not on file  . Frequency of Social Gatherings with Friends and Family: Not on file  . Attends Religious Services: Not on file  . Active Member of Clubs or Organizations: Not on file  . Attends Archivist Meetings: Not on file  . Marital Status: Living with  partner     Family History: The patient's family history includes Breast cancer in his mother; Coronary artery disease in his father; Prostate cancer in his father.  ROS:   Please see the history of present illness.    *** All other systems reviewed and are negative.  EKGs/Labs/Other Studies Reviewed:    The following studies were reviewed today: ***  EKG:  EKG is *** ordered today.  The ekg ordered today demonstrates ***  Recent Labs: 10/07/2018: Hemoglobin 14.1; Platelets 198.0; TSH 3.06 06/03/2019: ALT 9; BUN 13; Creatinine, Ser 1.11; Magnesium 1.4; NT-Pro BNP 367; Potassium 3.4; Sodium 139  Recent Lipid Panel    Component Value Date/Time   CHOL 135 10/07/2018 1447   TRIG 232.0 (H) 10/07/2018 1447   HDL 45.30 10/07/2018 1447   CHOLHDL 3 10/07/2018 1447   VLDL 46.4 (H) 10/07/2018 1447   LDLCALC 80 01/08/2010 0837   LDLDIRECT 71.0 10/07/2018 1447    Physical Exam:    VS:  There were no vitals taken for this visit.    Wt Readings from Last 3 Encounters:  06/06/19 (!) 308 lb (139.7 kg)  06/03/19 (!) 308 lb 1.9 oz (139.8 kg)  11/01/18 (!) 306 lb 6.4 oz (139 kg)     GEN: *** Well nourished, well developed in no acute distress HEENT: Normal NECK: No JVD; No carotid bruits LYMPHATICS: No lymphadenopathy CARDIAC: ***RRR, no murmurs, rubs, gallops RESPIRATORY:  Clear to auscultation without rales, wheezing or rhonchi  ABDOMEN: Soft, non-tender, non-distended MUSCULOSKELETAL:  No edema; No deformity  SKIN: Warm and dry NEUROLOGIC:  Alert and oriented x 3 PSYCHIATRIC:  Normal affect   ASSESSMENT:    No diagnosis found. PLAN:    In order of problems listed above:  Coronary artery disease -Post CABG continue with aggressive secondary risk factor prevention, no anginal symptoms aspirin beta-blocker statin.  LDL approximately 70.  With his prior liver issues he has not been on higher intensity statin.  Chronic diastolic heart failure #Torsemide.  Eplerenone.   Potassium supplementation as well.  Salt and fluid restrictions.  Exercise.  Continues to battle lower extremity edema. Could consider amyloid workup.   Ectopic atrial rhythm -Metoprolol.  Previously reviewed with Dr. Curt [name] of EP.  Asymptomatic.  Hypokalemia -Continuing with eplerenone, potassium sparing diuretic.  He is also on Demadex as well as potassium supplementation.  Morbid obesity -Continuing to work on this.  Has been offered [facility] but declined.  Obstructive sleep apnea -Dr. Annamaria [name] has been monitoring.  CPAP.   Medication Adjustments/Labs and Tests Ordered: Current medicines are reviewed at length with the patient today.  Concerns regarding medicines are outlined above.  No orders of the defined types were placed in this encounter.  No orders of the defined types were placed in this encounter.   There are no Patient Instructions on file for this visit.   Signed, [name] Furbish, MD  06/16/2019 3:53 PM    [place]

## 2019-06-17 ENCOUNTER — Ambulatory Visit: Payer: Medicare Other | Admitting: Cardiology

## 2019-06-22 ENCOUNTER — Encounter: Payer: Self-pay | Admitting: Primary Care

## 2019-06-24 ENCOUNTER — Ambulatory Visit: Payer: Medicare Other | Admitting: Internal Medicine

## 2019-07-03 ENCOUNTER — Encounter (HOSPITAL_COMMUNITY): Payer: Self-pay | Admitting: Emergency Medicine

## 2019-07-03 ENCOUNTER — Telehealth: Payer: Self-pay | Admitting: Cardiology

## 2019-07-03 ENCOUNTER — Emergency Department (HOSPITAL_COMMUNITY): Payer: Medicare Other

## 2019-07-03 ENCOUNTER — Other Ambulatory Visit: Payer: Self-pay

## 2019-07-03 ENCOUNTER — Emergency Department (HOSPITAL_COMMUNITY)
Admission: EM | Admit: 2019-07-03 | Discharge: 2019-07-03 | Disposition: A | Discharge status: Home / Self Care | Payer: Medicare Other | Attending: Emergency Medicine | Admitting: Emergency Medicine

## 2019-07-03 DIAGNOSIS — N182 Chronic kidney disease, stage 2 (mild): Secondary | ICD-10-CM | POA: Insufficient documentation

## 2019-07-03 DIAGNOSIS — I2510 Atherosclerotic heart disease of native coronary artery without angina pectoris: Secondary | ICD-10-CM | POA: Diagnosis not present

## 2019-07-03 DIAGNOSIS — I252 Old myocardial infarction: Secondary | ICD-10-CM | POA: Insufficient documentation

## 2019-07-03 DIAGNOSIS — Z79899 Other long term (current) drug therapy: Secondary | ICD-10-CM | POA: Diagnosis not present

## 2019-07-03 DIAGNOSIS — E669 Obesity, unspecified: Secondary | ICD-10-CM | POA: Diagnosis not present

## 2019-07-03 DIAGNOSIS — Z6841 Body Mass Index (BMI) 40.0 and over, adult: Secondary | ICD-10-CM | POA: Insufficient documentation

## 2019-07-03 DIAGNOSIS — E1122 Type 2 diabetes mellitus with diabetic chronic kidney disease: Secondary | ICD-10-CM | POA: Diagnosis not present

## 2019-07-03 DIAGNOSIS — I5032 Chronic diastolic (congestive) heart failure: Secondary | ICD-10-CM | POA: Diagnosis not present

## 2019-07-03 DIAGNOSIS — I739 Peripheral vascular disease, unspecified: Secondary | ICD-10-CM | POA: Insufficient documentation

## 2019-07-03 DIAGNOSIS — I130 Hypertensive heart and chronic kidney disease with heart failure and stage 1 through stage 4 chronic kidney disease, or unspecified chronic kidney disease: Secondary | ICD-10-CM | POA: Insufficient documentation

## 2019-07-03 DIAGNOSIS — R000 Tachycardia, unspecified: Secondary | ICD-10-CM | POA: Diagnosis present

## 2019-07-03 DIAGNOSIS — R600 Localized edema: Secondary | ICD-10-CM | POA: Insufficient documentation

## 2019-07-03 DIAGNOSIS — R002 Palpitations: Secondary | ICD-10-CM

## 2019-07-03 DIAGNOSIS — Z87891 Personal history of nicotine dependence: Secondary | ICD-10-CM | POA: Insufficient documentation

## 2019-07-03 LAB — CBC
HCT: 45.7 % (ref 39.0–52.0)
Hemoglobin: 14.9 g/dL (ref 13.0–17.0)
MCH: 32.9 pg (ref 26.0–34.0)
MCHC: 32.6 g/dL (ref 30.0–36.0)
MCV: 100.9 fL — ABNORMAL HIGH (ref 80.0–100.0)
Platelets: 212 10*3/uL (ref 150–400)
RBC: 4.53 MIL/uL (ref 4.22–5.81)
RDW: 14.7 % (ref 11.5–15.5)
WBC: 8.9 10*3/uL (ref 4.0–10.5)
nRBC: 0 % (ref 0.0–0.2)

## 2019-07-03 LAB — BASIC METABOLIC PANEL
Anion gap: 12 (ref 5–15)
BUN: 16 mg/dL (ref 8–23)
CO2: 30 mmol/L (ref 22–32)
Calcium: 8.9 mg/dL (ref 8.9–10.3)
Chloride: 98 mmol/L (ref 98–111)
Creatinine, Ser: 1.26 mg/dL — ABNORMAL HIGH (ref 0.61–1.24)
GFR calc Af Amer: >60 mL/min (ref >60)
GFR calc non Af Amer: 59 mL/min — ABNORMAL LOW (ref >60)
Glucose, Bld: 230 mg/dL — ABNORMAL HIGH (ref 70–99)
Potassium: 3.5 mmol/L (ref 3.5–5.1)
Sodium: 140 mmol/L (ref 135–145)

## 2019-07-03 LAB — CK: Total CK: 147 U/L (ref 49–397)

## 2019-07-03 LAB — TROPONIN I (HIGH SENSITIVITY)
Troponin I (High Sensitivity): 11 ng/L (ref <18)
Troponin I (High Sensitivity): 11 ng/L (ref <18)

## 2019-07-03 LAB — MAGNESIUM: Magnesium: 1.8 mg/dL (ref 1.7–2.4)

## 2019-07-03 MED ORDER — POTASSIUM CHLORIDE 20 MEQ PO PACK
60.0000 meq | PACK | Freq: Once | ORAL | Status: AC
  Administered 2019-07-03: 60 meq via ORAL
  Filled 2019-07-03: qty 3

## 2019-07-03 MED ORDER — SODIUM CHLORIDE 0.9% FLUSH
3.0000 mL | Freq: Once | INTRAVENOUS | Status: AC
  Administered 2019-07-03: 3 mL via INTRAVENOUS

## 2019-07-03 MED ORDER — MAGNESIUM OXIDE 400 (241.3 MG) MG PO TABS
400.0000 mg | ORAL_TABLET | Freq: Once | ORAL | Status: AC
  Administered 2019-07-03: 400 mg via ORAL
  Filled 2019-07-03: qty 1

## 2019-07-03 NOTE — ED Provider Notes (Signed)
[facility] EMERGENCY DEPARTMENT Provider Note   CSN: [id number] Arrival date & time: 07/03/19  1313     History Chief Complaint  Patient presents with  . Tachycardia  . Back Pain    [name] [name] is a 64 y.o. male.  HPI Patient is a 64 year old male presents for tachycardia.  In the mornings, he wakes up at 3 AM to take his medicines.  He had a normal morning this morning.  He ate some breakfast.  At around 10 AM, he felt a rapid heart rate.  He checked his blood pressure and heart rate.  Blood pressure was normal, however heart rate was in the 160s.  He took his potassium supplements (3 tablets of 20 mEq) and subsequently had heart rate in the 80s to 90s.  Since that time, he has not felt a rapid heart rate.  He has a history of ectopic atrial tachycardia.  He is on 20 mg of torsemide twice daily.  He has had issues with potassium and magnesium in the past.  Currently, he takes 60 mEq of potassium, 3 times a day, and magnesium supplementation as well.  He was urinating a lot yesterday.  He has had recent changes to his insulin regimen.  He has also been on prednisone recently for right ankle pain.  He has had increase in his BLE edema.  He also states that earlier today he had bilateral pain in his latissimus dorsi, worse with muscle activation.  This is also resolved following his home dose of potassium.  In the ED, he denies any current symptoms.     Past Medical History:  Diagnosis Date  . Acute myocardial infarction, unspecified site, episode of care unspecified 2005  . Acute pancreatitis   . CAD (coronary artery disease)    a. CABG in 2005 w LIMA to LAD, left radial to second circumflex marginal, saphenous vein graft to PDA, saphenous vein graft to lateral subbrach of ramus intermediate, and sequential saphenous vein graft to the medial subbranch of ramus intermediate.  . Chronic diastolic CHF (congestive heart failure) ([place])   . Cirrhosis of liver without  mention of alcohol   . CKD (chronic kidney disease), stage II   . Complications affecting other specified body systems, hypertension   . Ectopic atrial rhythm   . Essential hypertension   . Hyperlipidemia   . Hypokalemia    a. intermitttent noncompliance with potassium supplement.  . Hypomagnesemia   . Morbid obesity ([place])   . Neuropathy   . Other and unspecified hyperlipidemia   . Proteinuria   . RBBB   . Sleep apnea   . Type II or unspecified type diabetes mellitus without mention of complication, not stated as uncontrolled   . Varicose veins of both lower extremities     Patient Active Problem List   Diagnosis Date Noted  . Bilateral lower extremity edema 10/27/2018  . Type 2 diabetes mellitus with other specified complication ([place]), followed by Dr. Buddy Duty 10/14/2018  . Chronic heart failure with preserved ejection fraction ([place]) 08/29/2017  . Ectopic atrial rhythm 05/10/2017  . Varicose vein of leg 04/13/2017  . Peripheral vascular disease ([place]) 04/13/2017  . Primary osteoarthritis of both knees 01/21/2017  . Coronary artery disease involving native coronary artery of native heart without angina pectoris 10/29/2016  . Seasonal and perennial allergic rhinitis 07/17/2016  . Ulnar neuritis, left 03/27/2016  . Osteoarthritis of spine with radiculopathy, cervical region 03/27/2016  . PVD (peripheral vascular disease) ([place])  04/30/2009  . HLD (hyperlipidemia) 01/04/2007  . Hepatic cirrhosis ([place]) 01/04/2007  . Obstructive sleep apnea 01/04/2007  . HTN (hypertension) 01/04/2007    Past Surgical History:  Procedure Laterality Date  . CORONARY ARTERY BYPASS GRAFT     2005       Family History  Problem Relation Age of Onset  . Breast cancer Mother   . Prostate cancer Father   . Coronary artery disease Father        underwent CABG    Social History   Tobacco Use  . Smoking status: Former Smoker    Packs/day: 1.50    Years: 30.00    Pack years: 45.00    Quit date:  10/20/2004    Years since quitting: 14.7  . Smokeless tobacco: Never Used  Substance Use Topics  . Alcohol use: Yes    Comment: occasional  . Drug use: No    Home Medications Prior to Admission medications   Medication Sig Start Date End Date Taking? Authorizing Provider  ACCU-CHEK AVIVA PLUS test strip  02/18/16   [provider]  albuterol (PROVENTIL HFA;VENTOLIN HFA) 108 (90 Base) MCG/ACT inhaler Inhale 2 puffs into the lungs every 6 (six) hours as needed for wheezing or shortness of breath. 07/06/15   Baird Lyons D, MD  albuterol (PROVENTIL) (2.5 MG/3ML) 0.083% nebulizer solution Take 3 mLs (2.5 mg total) by nebulization every 6 (six) hours as needed for wheezing or shortness of breath. 04/29/13   Baird Lyons D, MD  aspirin EC 81 MG tablet Take 81 mg by mouth daily.     [provider]  B-D ULTRAFINE III SHORT PEN 31G X 8 MM MISC  03/11/16   [provider]  diclofenac Sodium (VOLTAREN) 1 % GEL APPLY 2 GRAMS TOPICALLY 4 TIMES DAILY TO KNEE 02/01/19   [name], MD  eplerenone (INSPRA) 25 MG tablet Take 1 tablet (25 mg total) by mouth daily. 12/03/18   Minus Breeding, MD  fluticasone (FLONASE) 50 MCG/ACT nasal spray Place 1 spray into both nostrils daily. 06/14/18   Baird Lyons D, MD  guaiFENesin (MUCINEX) 600 MG 12 hr tablet Take 2 tablets (1,200 mg total) by mouth 2 (two) times daily. 11/01/18   Martyn Ehrich, NP  HYDROcodone-homatropine [facility]) 5-1.5 MG/5ML syrup Take 5 mLs by mouth every 6 (six) hours as needed for cough. 05/20/19   Baird Lyons D, MD  ipratropium (ATROVENT) 0.02 % nebulizer solution Take 2.5 mLs (0.5 mg total) by nebulization every 6 (six) hours as needed for wheezing or shortness of breath. 04/29/13   Deneise Lever, MD  Lancets (ACCU-CHEK MULTICLIX) lancets  02/15/16   [provider]  losartan (COZAAR) 50 MG tablet TAKE 1/2 TABLET BY MOUTH EVERY DAY 05/12/19   [name], MD  Magnesium Oxide 400 (240 Mg) MG TABS  Take 1 tablet (400 mg total) by mouth in the morning and at bedtime. 06/06/19   Dunn, Nedra Hai, PA-C  metoprolol tartrate (LOPRESSOR) 50 MG tablet TAKE 1 TABLET(50 MG) BY MOUTH TWICE DAILY 06/13/19   [name], MD  nitroGLYCERIN (NITROSTAT) 0.4 MG SL tablet Place 1 tablet (0.4 mg total) under the tongue every 5 (five) minutes as needed for chest pain. 04/04/19   [name], MD  potassium chloride SA (K-DUR) 20 MEQ tablet Take 2 tablets (40 mEq total) by mouth 3 (three) times daily. 06/24/18   Dunn, Nedra Hai, PA-C  predniSONE (DELTASONE) 10 MG tablet Take 2 tablets (20  mg total) by mouth daily with breakfast. 06/06/19   Newt Minion, MD  rosuvastatin (CRESTOR) 20 MG tablet Take 1 tablet (20 mg total) by mouth every evening. 06/24/18   Dunn, Nedra Hai, PA-C  torsemide (DEMADEX) 20 MG tablet TAKE 1 TABLET(20 MG) BY MOUTH TWICE DAILY 06/13/19   [name], MD  simvastatin (ZOCOR) 80 MG tablet Take 80 mg by mouth daily.    06/03/19  [provider]    Allergies    Ibuprofen  Review of Systems   Review of Systems  Constitutional: Negative for activity change, appetite change, chills, fatigue and fever.  HENT: Negative.  Negative for ear pain and sore throat.   Eyes: Negative.  Negative for pain and visual disturbance.  Respiratory: Negative for cough, chest tightness, shortness of breath and wheezing.   Cardiovascular: Positive for palpitations and leg swelling (Chronic). Negative for chest pain.  Gastrointestinal: Negative.  Negative for abdominal pain, diarrhea, nausea and vomiting.  Genitourinary: Negative.  Negative for dysuria and hematuria.  Musculoskeletal: Positive for back pain (Bilateral latissimus dorsi). Negative for arthralgias, myalgias and neck pain.  Skin: Negative for color change and rash.  Neurological: Negative.  Negative for dizziness, seizures, syncope, facial asymmetry, weakness, light-headedness, numbness and headaches.  Hematological: Negative.     Psychiatric/Behavioral: Negative.   All other systems reviewed and are negative.   Physical Exam Updated Vital Signs BP 113/65   Pulse (!) 50   Temp 97.7 F (36.5 C) (Oral)   Resp 18   Ht 5\' 11"  (1.803 m)   Wt (!) 137 kg   SpO2 96%   BMI 42.12 kg/m   Physical Exam Vitals and nursing note reviewed.  Constitutional:      General: He is not in acute distress.    Appearance: He is well-developed. He is obese. He is not ill-appearing, toxic-appearing or diaphoretic.  HENT:     Head: Normocephalic and atraumatic.     Right Ear: External ear normal.     Left Ear: External ear normal.     Nose: Nose normal.     Mouth/Throat:     Mouth: Mucous membranes are moist.     Pharynx: Oropharynx is clear.  Eyes:     General: No scleral icterus.    Conjunctiva/sclera: Conjunctivae normal.  Cardiovascular:     Rate and Rhythm: Normal rate. Rhythm irregular.     Pulses: Normal pulses.     Heart sounds: No murmur.  Pulmonary:     Effort: Pulmonary effort is normal. No respiratory distress.     Breath sounds: Normal breath sounds. No wheezing or rales.  Chest:     Chest wall: No tenderness.  Abdominal:     Palpations: Abdomen is soft.     Tenderness: There is no abdominal tenderness. There is no right CVA tenderness, left CVA tenderness or guarding.  Musculoskeletal:        General: No tenderness, deformity or signs of injury.     Cervical back: Normal range of motion and neck supple. No tenderness.     Right lower leg: Edema present.     Left lower leg: Edema present.  Skin:    General: Skin is warm and dry.     Coloration: Skin is not jaundiced or pale.  Neurological:     General: No focal deficit present.     Mental Status: He is alert and oriented to person, place, and time.     Cranial Nerves: No cranial nerve deficit.  Sensory: No sensory deficit.     Motor: No weakness.     Coordination: Coordination normal.  Psychiatric:        Mood and Affect: Mood normal.         Behavior: Behavior normal.        Thought Content: Thought content normal.        Judgment: Judgment normal.     ED Results / Procedures / Treatments   Labs (all labs ordered are listed, but only abnormal results are displayed) Labs Reviewed  BASIC METABOLIC PANEL - Abnormal; Notable for the following components:      Result Value   Glucose, Bld 230 (*)    Creatinine, Ser 1.26 (*)    GFR calc non Af Amer 59 (*)    All other components within normal limits  CBC - Abnormal; Notable for the following components:   MCV 100.9 (*)    All other components within normal limits  MAGNESIUM  CK  TROPONIN I (HIGH SENSITIVITY)  TROPONIN I (HIGH SENSITIVITY)    EKG EKG Interpretation  Date/Time:  Sunday Jul 03 2019 13:27:18 EDT Ventricular Rate:  80 PR Interval:    QRS Duration: 112 QT Interval:  418 QTC Calculation: 482 R Axis:   -23 Text Interpretation: Sinus rhythm with marked sinus arrhythmia with Premature atrial complexes Incomplete right bundle branch block Minimal voltage criteria for LVH, may be normal variant ( R in aVL ) Nonspecific ST and T wave abnormality Abnormal ECG Confirmed by Elnora Morrison [phone]) on 07/03/2019 3:35:55 PM   Radiology DG Chest 2 View  Result Date: 07/03/2019 CLINICAL DATA:  Chest pain. EXAM: CHEST - 2 VIEW COMPARISON:  October 13, 2018 FINDINGS: Postsurgical changes of CABG. Cardiomediastinal silhouette is normal. Mediastinal contours appear intact. There is no evidence of focal airspace consolidation, pleural effusion or pneumothorax. Chronic interstitial lung changes. Osseous structures are without acute abnormality. Soft tissues are grossly normal. IMPRESSION: 1. No active cardiopulmonary disease. 2. Chronic interstitial lung changes. Electronically Signed   By: Fidela Salisbury M.D.   On: 07/03/2019 14:32    Procedures Procedures (including critical care time)  Medications Ordered in ED Medications  sodium chloride flush (NS) 0.9 %  injection 3 mL (3 mLs Intravenous Given 07/03/19 1529)  potassium chloride (KLOR-CON) packet 60 mEq (60 mEq Oral Given 07/03/19 1907)  magnesium oxide (MAG-OX) tablet 400 mg (400 mg Oral Given 07/03/19 1707)    ED Course  I have reviewed the triage vital signs and the nursing notes.  Pertinent labs & imaging results that were available during my care of the patient were reviewed by me and considered in my medical decision making (see chart for details).    MDM Rules/Calculators/A&P                      Patient is a 64 year old male who presents after experiencing palpitations and rapid heart rate this morning around 10 AM.  He states that he is experienced this before and at the time it was due to low potassium.  He took his home dose of potassium (60 mEq) and subsequently had resolution of palpitations and tachycardia.  Upon arrival in the ED, his vital signs, including heart rate and blood pressure, are normal.  Patient is well-appearing.  He denies any current symptoms.  EKG shows sinus rhythm with PACs, incomplete RBBB, no ST segment changes, no dynamic changes from prior EKGs.  Labs show potassium at the low end of normal range (  3.5).  Magnesium was also at the low range of normal (1.8).  Other labs, including troponins were unremarkable.  Given that the patient has a potassium 3.5, after home dose of 60 mEq, potassium was likely low at time of symptoms.  Given that the patient's symptoms have resolved following home dose of potassium, this further provides evidence of possible electrolyte abnormality causing his palpitations and tachycardia.  Patient was given an additional 60 mill equivalents of potassium in the ED.  400 mg of magnesium were given.  Patient was monitored for several hours.  He had no further evidence of tachycardia on cardiac monitor.  He denies any symptoms throughout the duration of his stay in the ED.  Patient appears to be highly reliable.  Shared decision-making was  made with plan for discharge home.  He stated that he would return to the ED if he experience any recurrence of symptoms.  He was discharged in stable condition.  Final Clinical Impression(s) / ED Diagnoses Final diagnoses:  Tachycardia  Palpitations    Rx / DC Orders ED Discharge Orders    None       Godfrey Pick, MD 07/04/19 1416    Elnora Morrison, MD 07/07/19 [phone]

## 2019-07-03 NOTE — ED Triage Notes (Signed)
Pt reports HR 162 at home.  States he called his doctor and was told to come to ED but now symptoms have resolved.  Denies chest pain or SOB.  States he has had soreness to upper back x 2 days that he believes is GERD from eating a hamburger.

## 2019-07-03 NOTE — ED Notes (Signed)
Patient verbalizes understanding of discharge instructions. Opportunity for questioning and answers were provided. Armband removed by staff, pt discharged from ED. Pt. ambulatory and discharged home.  

## 2019-07-03 NOTE — Discharge Instructions (Addendum)
Medications you got here: -60 mEQ of potassium -400 mg of magnesium  Continue your home medications, as prescribed.  Return to the ED if you experience return of tachycardia or any other concerning symptoms.

## 2019-07-03 NOTE — Telephone Encounter (Signed)
Patient called in reporting he checked his heart rate and blood pressure this morning.  Noted blood pressure to be 98 systolic, but heart rate in the 160s.  States overall he does not feel specifically symptomatic but a little weak.  Notes indicate he has a history of atrial tachycardia, no A. fib.  Last EKG in the office showed a heart rate of 85 bpm.  Given his elevated heart rate and soft blood pressures have advised him to be evaluated in the ED.  He is agreeable to this plan and thanked me for follow-up call.

## 2019-07-04 ENCOUNTER — Ambulatory Visit: Payer: Medicare Other | Admitting: Orthopedic Surgery

## 2019-07-08 ENCOUNTER — Telehealth: Payer: Self-pay | Admitting: *Deleted

## 2019-07-08 NOTE — Telephone Encounter (Signed)
Returning [name] [name]'s earlier voice message about compression hose that he purchased from [facility] that he states do not fit well.  Asked that he come into [facility] and bring the knee high compression hose and that I would measure and re-fit him.  Requested that he come in on a Tuesday so I would be available to assist him with compression hose fitting.

## 2019-07-15 ENCOUNTER — Ambulatory Visit (INDEPENDENT_AMBULATORY_CARE_PROVIDER_SITE_OTHER): Payer: Medicare Other | Admitting: Podiatry

## 2019-07-15 ENCOUNTER — Telehealth: Payer: Self-pay | Admitting: *Deleted

## 2019-07-15 ENCOUNTER — Other Ambulatory Visit: Payer: Self-pay

## 2019-07-15 DIAGNOSIS — L97512 Non-pressure chronic ulcer of other part of right foot with fat layer exposed: Secondary | ICD-10-CM

## 2019-07-15 DIAGNOSIS — E1142 Type 2 diabetes mellitus with diabetic polyneuropathy: Secondary | ICD-10-CM | POA: Diagnosis not present

## 2019-07-15 DIAGNOSIS — B351 Tinea unguium: Secondary | ICD-10-CM | POA: Diagnosis not present

## 2019-07-15 DIAGNOSIS — L84 Corns and callosities: Secondary | ICD-10-CM

## 2019-07-15 DIAGNOSIS — E1169 Type 2 diabetes mellitus with other specified complication: Secondary | ICD-10-CM | POA: Diagnosis not present

## 2019-07-15 DIAGNOSIS — E08621 Diabetes mellitus due to underlying condition with foot ulcer: Secondary | ICD-10-CM

## 2019-07-15 MED ORDER — SANTYL 250 UNIT/GM EX OINT
1.0000 | TOPICAL_OINTMENT | Freq: Every day | CUTANEOUS | 5 refills | Status: DC
Start: 2019-07-15 — End: 2022-04-26

## 2019-07-15 NOTE — Telephone Encounter (Signed)
Faxed required form, demographics to [employer].

## 2019-07-19 ENCOUNTER — Other Ambulatory Visit: Payer: Self-pay | Admitting: Internal Medicine

## 2019-08-03 ENCOUNTER — Other Ambulatory Visit: Payer: Self-pay | Admitting: Cardiology

## 2019-08-03 DIAGNOSIS — E1165 Type 2 diabetes mellitus with hyperglycemia: Secondary | ICD-10-CM | POA: Diagnosis not present

## 2019-08-03 DIAGNOSIS — Z794 Long term (current) use of insulin: Secondary | ICD-10-CM | POA: Diagnosis not present

## 2019-08-03 DIAGNOSIS — R609 Edema, unspecified: Secondary | ICD-10-CM | POA: Diagnosis not present

## 2019-08-03 DIAGNOSIS — E1142 Type 2 diabetes mellitus with diabetic polyneuropathy: Secondary | ICD-10-CM | POA: Diagnosis not present

## 2019-08-09 DIAGNOSIS — J45998 Other asthma: Secondary | ICD-10-CM | POA: Diagnosis not present

## 2019-08-15 NOTE — Progress Notes (Signed)
  Subjective:  Patient ID: [name] [name], male    DOB: [birth year],  MRN: [id number]  Chief Complaint  Patient presents with  . Nail Problem    Nail trim 1-5 bilateral  . Callouses    Right medial heel callous trim  . Wound Check    Right dorsal wound 3 wk duration, pt states was caused by wearing compression stockings. Denies fever/chills/nausea/vomiting.    64 y.o. male presents for wound care. Hx confirmed with patient.  Objective:  Physical Exam: Wound Location: Right dorsal ankle Wound Measurement: 6x0.3 Wound Base: Mixed Granular/Fibrotic Peri-wound: Normal Exudate: Scant/small amount Serosanguinous exudate wound without warmth, erythema, signs of acute infection  Nails thickened and dystrophic Callus of the right medial heel Assessment:   1. Onychomycosis of multiple toenails with type 2 diabetes mellitus and peripheral neuropathy ([place])   2. 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 Callus of foot      Plan:  Patient was evaluated and treated and all questions answered.  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 Instrumentation: Nail nipper, rotary burr. Number of Nails: 10   Procedure: Paring of Lesion Rationale: painful hyperkeratotic lesion Type of Debridement: manual, sharp debridement. Instrumentation: 312 blade Number of Lesions: 1      No follow-ups on file.

## 2019-08-18 ENCOUNTER — Other Ambulatory Visit: Payer: Self-pay

## 2019-08-18 ENCOUNTER — Ambulatory Visit (INDEPENDENT_AMBULATORY_CARE_PROVIDER_SITE_OTHER): Payer: Medicare Other | Admitting: Podiatry

## 2019-08-18 ENCOUNTER — Other Ambulatory Visit: Payer: Medicare Other | Admitting: Orthotics

## 2019-08-18 DIAGNOSIS — Z5329 Procedure and treatment not carried out because of patient's decision for other reasons: Secondary | ICD-10-CM

## 2019-08-18 NOTE — Telephone Encounter (Signed)
Dr. Annamaria Boots, please advise.  Allergies  Allergen Reactions  . Ibuprofen Other (See Comments)    Stomach pain     Current Outpatient Medications:  .  ACCU-CHEK AVIVA PLUS test strip, , Disp: , Rfl:  .  albuterol (PROVENTIL HFA;VENTOLIN HFA) 108 (90 Base) MCG/ACT inhaler, Inhale 2 puffs into the lungs every 6 (six) hours as needed for wheezing or shortness of breath., Disp: 1 Inhaler, Rfl: 12 .  albuterol (PROVENTIL) (2.5 MG/3ML) 0.083% nebulizer solution, Take 3 mLs (2.5 mg total) by nebulization every 6 (six) hours as needed for wheezing or shortness of breath., Disp: 75 mL, Rfl: prn .  aspirin EC 81 MG tablet, Take 81 mg by mouth daily. , Disp: , Rfl:  .  B-D ULTRAFINE III SHORT PEN 31G X 8 MM MISC, , Disp: , Rfl: 1 .  collagenase (SANTYL) ointment, Apply 1 application topically daily. Right ankle measures 6.0 x 0.3cm., Disp: 30 g, Rfl: 5 .  diclofenac Sodium (VOLTAREN) 1 % GEL, APPLY 2 GRAMS TOPICALLY 4 TIMES DAILY TO KNEE, Disp: 100 g, Rfl: 2 .  eplerenone (INSPRA) 25 MG tablet, TAKE 1 TABLET(25 MG) BY MOUTH DAILY, Disp: 90 tablet, Rfl: 2 .  fluticasone (FLONASE) 50 MCG/ACT nasal spray, Place 1 spray into both nostrils daily., Disp: 16 g, Rfl: 12 .  guaiFENesin (MUCINEX) 600 MG 12 hr tablet, Take 2 tablets (1,200 mg total) by mouth 2 (two) times daily., Disp: 60 tablet, Rfl: 1 .  HUMALOG KWIKPEN 200 UNIT/ML KwikPen, SMARTSIG:28-100 Unit(s) SUB-Q Daily, Disp: , Rfl:  .  HUMULIN R U-500 KWIKPEN 500 UNIT/ML kwikpen, , Disp: , Rfl:  .  HYDROcodone-homatropine (HYCODAN) 5-1.5 MG/5ML syrup, Take 5 mLs by mouth every 6 (six) hours as needed for cough., Disp: 120 mL, Rfl: 0 .  ipratropium (ATROVENT) 0.02 % nebulizer solution, Take 2.5 mLs (0.5 mg total) by nebulization every 6 (six) hours as needed for wheezing or shortness of breath., Disp: 75 mL, Rfl: prn .  Lancets (ACCU-CHEK MULTICLIX) lancets, , Disp: , Rfl:  .  LANTUS SOLOSTAR 100 UNIT/ML Solostar Pen, SMARTSIG:55 Unit(s) Topical Every  Morning, Disp: , Rfl:  .  losartan (COZAAR) 50 MG tablet, TAKE 1/2 TABLET BY MOUTH EVERY DAY, Disp: 45 tablet, Rfl: 1 .  Magnesium Oxide 400 (240 Mg) MG TABS, Take 1 tablet (400 mg total) by mouth in the morning and at bedtime., Disp: 60 tablet, Rfl: 11 .  MAGNESIUM-OXIDE 400 (241.3 Mg) MG tablet, Take 1 tablet by mouth 2 (two) times daily., Disp: , Rfl:  .  metoprolol tartrate (LOPRESSOR) 50 MG tablet, TAKE 1 TABLET(50 MG) BY MOUTH TWICE DAILY, Disp: 180 tablet, Rfl: 3 .  nitroGLYCERIN (NITROSTAT) 0.4 MG SL tablet, Place 1 tablet (0.4 mg total) under the tongue every 5 (five) minutes as needed for chest pain., Disp: 25 tablet, Rfl: 0 .  potassium chloride SA (K-DUR) 20 MEQ tablet, Take 2 tablets (40 mEq total) by mouth 3 (three) times daily., Disp: 240 tablet, Rfl: 11 .  predniSONE (DELTASONE) 10 MG tablet, Take 2 tablets (20 mg total) by mouth daily with breakfast., Disp: 60 tablet, Rfl: 0 .  rosuvastatin (CRESTOR) 20 MG tablet, Take 1 tablet (20 mg total) by mouth every evening., Disp: 30 tablet, Rfl: 11 .  SANTYL ointment, , Disp: , Rfl:  .  torsemide (DEMADEX) 20 MG tablet, TAKE 1 TABLET(20 MG) BY MOUTH TWICE DAILY, Disp: 180 tablet, Rfl: 3

## 2019-08-18 NOTE — Progress Notes (Signed)
No show

## 2019-08-18 NOTE — Telephone Encounter (Signed)
Sorry- request denied. Too long since last office visit. Suggest otc Delsym cough syrup.

## 2019-08-19 NOTE — Telephone Encounter (Signed)
Script denied - too long since seen here. Denial sent.

## 2019-08-19 NOTE — Telephone Encounter (Signed)
Dr. Annamaria Boots - can you send in the denial? We do not have clearance to do that. I have declined it but you need to sign the order. Thanks.

## 2019-09-01 ENCOUNTER — Encounter (HOSPITAL_COMMUNITY): Payer: Self-pay | Admitting: Emergency Medicine

## 2019-09-01 ENCOUNTER — Inpatient Hospital Stay (HOSPITAL_COMMUNITY)
Admission: EM | Admit: 2019-09-01 | Discharge: 2019-09-03 | DRG: 501 | Disposition: A | Discharge status: Home / Self Care | Payer: Medicare Other | Attending: Internal Medicine | Admitting: Internal Medicine

## 2019-09-01 ENCOUNTER — Ambulatory Visit (INDEPENDENT_AMBULATORY_CARE_PROVIDER_SITE_OTHER): Payer: Medicare Other | Admitting: Podiatry

## 2019-09-01 ENCOUNTER — Ambulatory Visit (INDEPENDENT_AMBULATORY_CARE_PROVIDER_SITE_OTHER): Payer: Medicare Other

## 2019-09-01 ENCOUNTER — Other Ambulatory Visit: Payer: Self-pay

## 2019-09-01 DIAGNOSIS — S91302A Unspecified open wound, left foot, initial encounter: Secondary | ICD-10-CM | POA: Diagnosis present

## 2019-09-01 DIAGNOSIS — Z8042 Family history of malignant neoplasm of prostate: Secondary | ICD-10-CM

## 2019-09-01 DIAGNOSIS — E785 Hyperlipidemia, unspecified: Secondary | ICD-10-CM | POA: Diagnosis not present

## 2019-09-01 DIAGNOSIS — L97512 Non-pressure chronic ulcer of other part of right foot with fat layer exposed: Secondary | ICD-10-CM

## 2019-09-01 DIAGNOSIS — E119 Type 2 diabetes mellitus without complications: Secondary | ICD-10-CM

## 2019-09-01 DIAGNOSIS — Z23 Encounter for immunization: Secondary | ICD-10-CM | POA: Diagnosis not present

## 2019-09-01 DIAGNOSIS — Z794 Long term (current) use of insulin: Secondary | ICD-10-CM | POA: Diagnosis not present

## 2019-09-01 DIAGNOSIS — N182 Chronic kidney disease, stage 2 (mild): Secondary | ICD-10-CM | POA: Diagnosis present

## 2019-09-01 DIAGNOSIS — I2510 Atherosclerotic heart disease of native coronary artery without angina pectoris: Secondary | ICD-10-CM | POA: Diagnosis present

## 2019-09-01 DIAGNOSIS — S91105A Unspecified open wound of left lesser toe(s) without damage to nail, initial encounter: Secondary | ICD-10-CM | POA: Diagnosis not present

## 2019-09-01 DIAGNOSIS — S92532B Displaced fracture of distal phalanx of left lesser toe(s), initial encounter for open fracture: Principal | ICD-10-CM | POA: Diagnosis present

## 2019-09-01 DIAGNOSIS — Z7952 Long term (current) use of systemic steroids: Secondary | ICD-10-CM

## 2019-09-01 DIAGNOSIS — E876 Hypokalemia: Secondary | ICD-10-CM | POA: Diagnosis not present

## 2019-09-01 DIAGNOSIS — Z951 Presence of aortocoronary bypass graft: Secondary | ICD-10-CM

## 2019-09-01 DIAGNOSIS — I5032 Chronic diastolic (congestive) heart failure: Secondary | ICD-10-CM | POA: Diagnosis present

## 2019-09-01 DIAGNOSIS — I25708 Atherosclerosis of coronary artery bypass graft(s), unspecified, with other forms of angina pectoris: Secondary | ICD-10-CM | POA: Diagnosis present

## 2019-09-01 DIAGNOSIS — K7460 Unspecified cirrhosis of liver: Secondary | ICD-10-CM | POA: Diagnosis present

## 2019-09-01 DIAGNOSIS — Z79899 Other long term (current) drug therapy: Secondary | ICD-10-CM

## 2019-09-01 DIAGNOSIS — S92535B Nondisplaced fracture of distal phalanx of left lesser toe(s), initial encounter for open fracture: Secondary | ICD-10-CM | POA: Diagnosis not present

## 2019-09-01 DIAGNOSIS — E1169 Type 2 diabetes mellitus with other specified complication: Secondary | ICD-10-CM | POA: Diagnosis not present

## 2019-09-01 DIAGNOSIS — Z6841 Body Mass Index (BMI) 40.0 and over, adult: Secondary | ICD-10-CM

## 2019-09-01 DIAGNOSIS — E08621 Diabetes mellitus due to underlying condition with foot ulcer: Secondary | ICD-10-CM | POA: Diagnosis not present

## 2019-09-01 DIAGNOSIS — I130 Hypertensive heart and chronic kidney disease with heart failure and stage 1 through stage 4 chronic kidney disease, or unspecified chronic kidney disease: Secondary | ICD-10-CM | POA: Diagnosis present

## 2019-09-01 DIAGNOSIS — Z7984 Long term (current) use of oral hypoglycemic drugs: Secondary | ICD-10-CM

## 2019-09-01 DIAGNOSIS — T148XXA Other injury of unspecified body region, initial encounter: Secondary | ICD-10-CM | POA: Diagnosis present

## 2019-09-01 DIAGNOSIS — M4722 Other spondylosis with radiculopathy, cervical region: Secondary | ICD-10-CM | POA: Diagnosis present

## 2019-09-01 DIAGNOSIS — I4510 Unspecified right bundle-branch block: Secondary | ICD-10-CM | POA: Diagnosis present

## 2019-09-01 DIAGNOSIS — I739 Peripheral vascular disease, unspecified: Secondary | ICD-10-CM

## 2019-09-01 DIAGNOSIS — W271XXA Contact with garden tool, initial encounter: Secondary | ICD-10-CM

## 2019-09-01 DIAGNOSIS — E1142 Type 2 diabetes mellitus with diabetic polyneuropathy: Secondary | ICD-10-CM | POA: Diagnosis present

## 2019-09-01 DIAGNOSIS — G4733 Obstructive sleep apnea (adult) (pediatric): Secondary | ICD-10-CM | POA: Diagnosis not present

## 2019-09-01 DIAGNOSIS — Z87891 Personal history of nicotine dependence: Secondary | ICD-10-CM | POA: Diagnosis not present

## 2019-09-01 DIAGNOSIS — Z886 Allergy status to analgesic agent status: Secondary | ICD-10-CM

## 2019-09-01 DIAGNOSIS — I252 Old myocardial infarction: Secondary | ICD-10-CM | POA: Diagnosis not present

## 2019-09-01 DIAGNOSIS — Z20822 Contact with and (suspected) exposure to covid-19: Secondary | ICD-10-CM | POA: Diagnosis present

## 2019-09-01 DIAGNOSIS — S92534B Nondisplaced fracture of distal phalanx of right lesser toe(s), initial encounter for open fracture: Secondary | ICD-10-CM | POA: Diagnosis not present

## 2019-09-01 DIAGNOSIS — Z803 Family history of malignant neoplasm of breast: Secondary | ICD-10-CM

## 2019-09-01 DIAGNOSIS — Z8249 Family history of ischemic heart disease and other diseases of the circulatory system: Secondary | ICD-10-CM | POA: Diagnosis not present

## 2019-09-01 DIAGNOSIS — I10 Essential (primary) hypertension: Secondary | ICD-10-CM | POA: Diagnosis present

## 2019-09-01 DIAGNOSIS — E1122 Type 2 diabetes mellitus with diabetic chronic kidney disease: Secondary | ICD-10-CM | POA: Diagnosis present

## 2019-09-01 DIAGNOSIS — Z7982 Long term (current) use of aspirin: Secondary | ICD-10-CM

## 2019-09-01 LAB — CBC WITH DIFFERENTIAL/PLATELET
Abs Immature Granulocytes: 0.02 10*3/uL (ref 0.00–0.07)
Basophils Absolute: 0.1 10*3/uL (ref 0.0–0.1)
Basophils Relative: 1 %
Eosinophils Absolute: 0.2 10*3/uL (ref 0.0–0.5)
Eosinophils Relative: 2 %
HCT: 49.1 % (ref 39.0–52.0)
Hemoglobin: 15.9 g/dL (ref 13.0–17.0)
Immature Granulocytes: 0 %
Lymphocytes Relative: 44 %
Lymphs Abs: 3.6 10*3/uL (ref 0.7–4.0)
MCH: 32.9 pg (ref 26.0–34.0)
MCHC: 32.4 g/dL (ref 30.0–36.0)
MCV: 101.7 fL — ABNORMAL HIGH (ref 80.0–100.0)
Monocytes Absolute: 0.9 10*3/uL (ref 0.1–1.0)
Monocytes Relative: 11 %
Neutro Abs: 3.4 10*3/uL (ref 1.7–7.7)
Neutrophils Relative %: 42 %
Platelets: 225 10*3/uL (ref 150–400)
RBC: 4.83 MIL/uL (ref 4.22–5.81)
RDW: 14.4 % (ref 11.5–15.5)
WBC: 8.1 10*3/uL (ref 4.0–10.5)
nRBC: 0 % (ref 0.0–0.2)

## 2019-09-01 LAB — BASIC METABOLIC PANEL
Anion gap: 11 (ref 5–15)
BUN: 10 mg/dL (ref 8–23)
CO2: 27 mmol/L (ref 22–32)
Calcium: 9.3 mg/dL (ref 8.9–10.3)
Chloride: 104 mmol/L (ref 98–111)
Creatinine, Ser: 0.93 mg/dL (ref 0.61–1.24)
GFR calc Af Amer: >60 mL/min (ref >60)
GFR calc non Af Amer: >60 mL/min (ref >60)
Glucose, Bld: 111 mg/dL — ABNORMAL HIGH (ref 70–99)
Potassium: 3.4 mmol/L — ABNORMAL LOW (ref 3.5–5.1)
Sodium: 142 mmol/L (ref 135–145)

## 2019-09-01 LAB — CBG MONITORING, ED
Glucose-Capillary: 95 mg/dL (ref 70–99)
Glucose-Capillary: 117 mg/dL — ABNORMAL HIGH (ref 70–99)

## 2019-09-01 LAB — SARS CORONAVIRUS 2 BY RT PCR (HOSPITAL ORDER, PERFORMED IN ~~LOC~~ HOSPITAL LAB): SARS Coronavirus 2: NEGATIVE

## 2019-09-01 MED ORDER — TETANUS-DIPHTH-ACELL PERTUSSIS 5-2.5-18.5 LF-MCG/0.5 IM SUSP
0.5000 mL | Freq: Once | INTRAMUSCULAR | Status: AC
  Administered 2019-09-01: 0.5 mL via INTRAMUSCULAR
  Filled 2019-09-01: qty 0.5

## 2019-09-01 MED ORDER — CEFAZOLIN SODIUM-DEXTROSE 2-4 GM/100ML-% IV SOLN
2.0000 g | Freq: Once | INTRAVENOUS | Status: AC
  Administered 2019-09-01: 2 g via INTRAVENOUS
  Filled 2019-09-01: qty 100

## 2019-09-01 NOTE — Progress Notes (Signed)
  Subjective:  Patient ID: [name] [name], male    DOB: 1955/05/31,  MRN: [id number]  Chief Complaint  Patient presents with  . Toe Injury    L 4th toe -wound from tip to base of toe. Pt stated, "I stepped on a metal rake yesterday. It split the toe open. My friend put Silvadene on it and wrapped it up. It's still bleeding. I can feel pain sometimes". No fever/chills/N&V/foul odor/pus.   . Diabetes    Pt is interested in diabetic shoes.    63 y.o. male presents with the above complaint. History confirmed with patient. States the injury happened 1pm yesterday  Objective:  Physical Exam: warm, good capillary refill, no trophic changes or ulcerative lesions, normal DP and diminished PT pulses and diminished sensory exam. Right Foot: left 4th toe full thickness v shaped laceration with active bleeding. Exposed bone to the wound. Toe edematous. No cellulitis. No purulence.  No images are attached to the encounter.  Radiographs: X-ray of the right foot: taken and reviewed. Small avulsion/erosion of the tip of the distal phalanx Assessment:   1. 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 Open nondisplaced fracture of distal phalanx of lesser toe of right foot, initial encounter   3. Diabetic polyneuropathy associated with type 2 diabetes mellitus ([place])   4. Peripheral vascular disease ([place])    Plan:  Patient was evaluated and treated and all questions answered.  Open Laceration with Small Fracture Left 4th Toe; High Risk DM -Full thickness injury will require surgical intervention consisting of debridement and irrigation with wound closure -Overall the toe does not appear acutely infected however with the open injury he would benefit from following open fracture protocol. He is at high risk of developing bone infection and maybe needing amputation from this injury. -Will need tetanus update  and IV abx. -Plan for surgery tomorrow. -Patient advised to proceed directly to the ED to start abx and update tetanus and admit for further care. Should ED staff have any questions or concerns I advised the patient they can call or message me and I would be happy to speak to them.  No follow-ups on file.

## 2019-09-01 NOTE — ED Provider Notes (Signed)
[facility] DEPT Provider Note   CSN: [id number] Arrival date & time: 09/01/19  1419     History Chief Complaint  Patient presents with  . Foot Pain    [name] [name] is a 64 y.o. male with history of MI, CAD, CHF, CKD, hypertension, hyperlipidemia, obesity, peripheral neuropathy, type 2 diabetes mellitus presents for evaluation of acute onset, persistent left fourth toe wound sustained yesterday.  He reports that he stepped on a rusted metal rake which punctured through the bottom of his shoe and split open the skin along the plantar surface of the left fourth toe.  He denies any pain to the area.  He has constant paresthesias to the lower extremities due to his diabetic neuropathy.  Endorses chronic bilateral lower extremity edema.  He denies fevers, chills, nausea, vomiting, diaphoresis, chest pain or shortness of breath.  He went to see his podiatrist earlier today who obtained radiographs which showed a small avulsion/erosion of the tip of the distal phalanx, full-thickness laceration overlying the fourth toe which will require surgical repair.  He recommended presentation to the ED for updated tetanus and IV antibiotics.   The history is provided by the patient.       Past Medical History:  Diagnosis Date  . Acute myocardial infarction, unspecified site, episode of care unspecified 2005  . Acute pancreatitis   . CAD (coronary artery disease)    a. CABG in 2005 w LIMA to LAD, left radial to second circumflex marginal, saphenous vein graft to PDA, saphenous vein graft to lateral subbrach of ramus intermediate, and sequential saphenous vein graft to the medial subbranch of ramus intermediate.  . Chronic diastolic CHF (congestive heart failure) ([place])   . Cirrhosis of liver without mention of alcohol   . CKD (chronic kidney disease), stage II   . Complications affecting other specified body systems, hypertension   . Ectopic atrial rhythm   . Essential  hypertension   . Hyperlipidemia   . Hypokalemia    a. intermitttent noncompliance with potassium supplement.  . Hypomagnesemia   . Morbid obesity ([place])   . Neuropathy   . Other and unspecified hyperlipidemia   . Proteinuria   . RBBB   . Sleep apnea   . Type II or unspecified type diabetes mellitus without mention of complication, not stated as uncontrolled   . Varicose veins of both lower extremities     Patient Active Problem List   Diagnosis Date Noted  . Open wound of left foot 09/01/2019  . Bilateral lower extremity edema 10/27/2018  . Type 2 diabetes mellitus with other specified complication ([place]), followed by Dr. Buddy Duty 10/14/2018  . Chronic heart failure with preserved ejection fraction ([place]) 08/29/2017  . Ectopic atrial rhythm 05/10/2017  . Varicose vein of leg 04/13/2017  . Peripheral vascular disease ([place]) 04/13/2017  . Primary osteoarthritis of both knees 01/21/2017  . Coronary artery disease involving native coronary artery of native heart without angina pectoris 10/29/2016  . Seasonal and perennial allergic rhinitis 07/17/2016  . Ulnar neuritis, left 03/27/2016  . Osteoarthritis of spine with radiculopathy, cervical region 03/27/2016  . PVD (peripheral vascular disease) ([place]) 04/30/2009  . HLD (hyperlipidemia) 01/04/2007  . Hepatic cirrhosis ([place]) 01/04/2007  . Obstructive sleep apnea 01/04/2007  . HTN (hypertension) 01/04/2007    Past Surgical History:  Procedure Laterality Date  . CORONARY ARTERY BYPASS GRAFT     2005       Family History  Problem Relation Age of Onset  .  Breast cancer Mother   . Prostate cancer Father   . Coronary artery disease Father        underwent CABG    Social History   Tobacco Use  . Smoking status: Former Smoker    Packs/day: 1.50    Years: 30.00    Pack years: 45.00    Quit date: 10/20/2004    Years since quitting: 14.8  . Smokeless tobacco: Never Used  Vaping Use  . Vaping Use: Never used  Substance Use Topics    . Alcohol use: Yes    Comment: occasional  . Drug use: No    Home Medications Prior to Admission medications   Medication Sig Start Date End Date Taking? Authorizing Provider  albuterol (PROVENTIL HFA;VENTOLIN HFA) 108 (90 Base) MCG/ACT inhaler Inhale 2 puffs into the lungs every 6 (six) hours as needed for wheezing or shortness of breath. 07/06/15  Yes Young, Clinton D, MD  albuterol (PROVENTIL) (2.5 MG/3ML) 0.083% nebulizer solution Take 3 mLs (2.5 mg total) by nebulization every 6 (six) hours as needed for wheezing or shortness of breath. 04/29/13  Yes Baird Lyons D, MD  aspirin EC 81 MG tablet Take 81 mg by mouth daily.    Yes [provider]  collagenase (SANTYL) ointment Apply 1 application topically daily. Right ankle measures 6.0 x 0.3cm. 07/15/19  Yes Evelina Bucy, DPM  diclofenac Sodium (VOLTAREN) 1 % GEL APPLY 2 GRAMS TOPICALLY 4 TIMES DAILY TO KNEE Patient taking differently: Apply 2 g topically 4 (four) times daily. To knee 02/01/19  Yes [name], MD  empagliflozin (JARDIANCE) 10 MG TABS tablet Take 10 mg by mouth daily.   Yes [provider]  eplerenone (INSPRA) 25 MG tablet TAKE 1 TABLET(25 MG) BY MOUTH DAILY Patient taking differently: Take 25 mg by mouth daily.  08/03/19  Yes Minus Breeding, MD  fluticasone (FLONASE) 50 MCG/ACT nasal spray Place 1 spray into both nostrils daily. 06/14/18  Yes Young, Tarri Fuller D, MD  HUMULIN R U-500 KWIKPEN 500 UNIT/ML kwikpen Inject 50-60 Units into the skin See admin instructions. 60 units in the AM, 50 units at lunch and 50 in the evening 06/02/19  Yes [provider]  losartan (COZAAR) 50 MG tablet TAKE 1/2 TABLET BY MOUTH EVERY DAY 05/12/19  Yes [name], MD  Magnesium Oxide 400 (240 Mg) MG TABS Take 1 tablet (400 mg total) by mouth in the morning and at bedtime. 06/06/19  Yes Dunn, Dayna N, PA-C  metoprolol tartrate (LOPRESSOR) 50 MG tablet TAKE 1 TABLET(50 MG) BY MOUTH TWICE DAILY Patient taking  differently: Take 50 mg by mouth 2 (two) times daily.  06/13/19  Yes [name], MD  nitroGLYCERIN (NITROSTAT) 0.4 MG SL tablet Place 1 tablet (0.4 mg total) under the tongue every 5 (five) minutes as needed for chest pain. 04/04/19  Yes [name], MD  potassium chloride SA (K-DUR) 20 MEQ tablet Take 2 tablets (40 mEq total) by mouth 3 (three) times daily. 06/24/18  Yes Dunn, Dayna N, PA-C  rosuvastatin (CRESTOR) 20 MG tablet Take 1 tablet (20 mg total) by mouth every evening. 06/24/18  Yes Dunn, Dayna N, PA-C  torsemide (DEMADEX) 20 MG tablet TAKE 1 TABLET(20 MG) BY MOUTH TWICE DAILY Patient taking differently: Take 20 mg by mouth 2 (two) times daily.  06/13/19  Yes [name], MD  ACCU-CHEK AVIVA PLUS test strip  02/18/16   [provider]  B-D ULTRAFINE III SHORT PEN 31G X  8 MM MISC  03/11/16   [provider]  guaiFENesin (MUCINEX) 600 MG 12 hr tablet Take 2 tablets (1,200 mg total) by mouth 2 (two) times daily. Patient not taking: Reported on 09/01/2019 11/01/18   Martyn Ehrich, NP  HYDROcodone-homatropine [facility]) 5-1.5 MG/5ML syrup Take 5 mLs by mouth every 6 (six) hours as needed for cough. Patient not taking: Reported on 09/01/2019 05/20/19   Baird Lyons D, MD  ipratropium (ATROVENT) 0.02 % nebulizer solution Take 2.5 mLs (0.5 mg total) by nebulization every 6 (six) hours as needed for wheezing or shortness of breath. Patient not taking: Reported on 09/01/2019 04/29/13   Deneise Lever, MD  Lancets (ACCU-CHEK MULTICLIX) lancets  02/15/16   [provider]  predniSONE (DELTASONE) 10 MG tablet Take 2 tablets (20 mg total) by mouth daily with breakfast. 06/06/19   Newt Minion, MD  simvastatin (ZOCOR) 80 MG tablet Take 80 mg by mouth daily.    06/03/19  [provider]    Allergies    Ibuprofen  Review of Systems   Review of Systems  Constitutional: Negative for chills and fever.  Cardiovascular: Positive for leg swelling.  Skin: Positive for  wound.  Neurological: Positive for numbness (Chronic, unchanged).  All other systems reviewed and are negative.   Physical Exam Updated Vital Signs BP (!) 139/91   Pulse 100   Temp 98.1 F (36.7 C) (Oral)   Resp 18   SpO2 100%   Physical Exam Vitals and nursing note reviewed.  Constitutional:      General: He is not in acute distress.    Appearance: He is well-developed. He is obese.  HENT:     Head: Normocephalic and atraumatic.  Eyes:     General:        Right eye: No discharge.        Left eye: No discharge.     Conjunctiva/sclera: Conjunctivae normal.  Neck:     Vascular: No JVD.     Trachea: No tracheal deviation.  Cardiovascular:     Rate and Rhythm: Normal rate.     Comments: 1+ DP/PT pulses bilaterally.  Extremities are warm to touch.  3+ pitting edema of the bilateral lower extremities.  Bevelyn Buckles' sign absent bilaterally. Pulmonary:     Effort: Pulmonary effort is normal.  Abdominal:     General: There is no distension.     Palpations: Abdomen is soft.  Musculoskeletal:     Comments: See below image. Patient with laceration involving the plantar aspect of left 4th toe, no active bleeding. The left lower extremity is mildly erythematous and warm to touch. No crepitus noted.  Skin:    General: Skin is warm and dry.     Findings: No erythema.  Neurological:     Mental Status: He is alert.  Psychiatric:        Behavior: Behavior normal.         ED Results / Procedures / Treatments   Labs (all labs ordered are listed, but only abnormal results are displayed) Labs Reviewed  BASIC METABOLIC PANEL - Abnormal; Notable for the following components:      Result Value   Potassium 3.4 (*)    Glucose, Bld 111 (*)    All other components within normal limits  CBC WITH DIFFERENTIAL/PLATELET - Abnormal; Notable for the following components:   MCV 101.7 (*)    All other components within normal limits  CBG MONITORING, ED - Abnormal; Notable for the following  components:   Glucose-Capillary 117 (*)    All other components within normal limits  SARS CORONAVIRUS 2 BY RT PCR (HOSPITAL ORDER, [facility])  HIV ANTIBODY (ROUTINE TESTING W REFLEX)  COMPREHENSIVE METABOLIC PANEL  CBG MONITORING, ED    EKG None  Radiology DG Foot Complete Left  Result Date: 09/01/2019 Please see detailed radiograph report in office note.   Procedures Procedures (including critical care time)  Medications Ordered in ED Medications  0.9 % NaCl with KCl 20 mEq/ L  infusion (has no administration in time range)  acetaminophen (TYLENOL) tablet 650 mg (has no administration in time range)    Or  acetaminophen (TYLENOL) suppository 650 mg (has no administration in time range)  oxyCODONE (Oxy IR/ROXICODONE) immediate release tablet 5 mg (has no administration in time range)  prochlorperazine (COMPAZINE) injection 10 mg (has no administration in time range)  Tdap (BOOSTRIX) injection 0.5 mL (0.5 mLs Intramuscular Given 09/01/19 2155)  ceFAZolin (ANCEF) IVPB 2g/100 mL premix (0 g Intravenous Stopped 09/01/19 2331)    ED Course  I have reviewed the triage vital signs and the nursing notes.  Pertinent labs & imaging results that were available during my care of the patient were reviewed by me and considered in my medical decision making (see chart for details).    MDM Rules/Calculators/A&P                          Patient sent from podiatrist for admission for management of open fracture/laceration involving the left fourth toe.  The patient is afebrile, vital signs are stable.  He is nontoxic in appearance.  He is resting comfortably on initial assessment.  Compartments are soft.  He denies fevers, weakness, diaphoresis, vomiting.  We will update his tetanus and obtain basic labs, consult podiatry for further recommendations.  9:32 PM CONSULT: Spoke with Dr. Cannon Kettle with podiatry.  She recommends starting the patient on Ancef and admitting  to the hospitalist service.  Plan to keep the patient n.p.o. after midnight.  They will plan for operative repair in the morning.  Lab work reviewed and interpreted by myself shows no leukocytosis, mild hypokalemia, no metabolic derangements or renal insufficiency.  Spoke with Dr. Olevia Bowens with [facility] hospitalist service who agrees to assume care of patient and bring him into the hospital for further evaluation and management.  Final Clinical Impression(s) / ED Diagnoses Final diagnoses:  Open nondisplaced fracture of distal phalanx of lesser toe of left foot, initial encounter    Rx / DC Orders ED Discharge Orders    None       Renita Papa, PA-C 09/02/19 0014    Margette Fast, MD 09/02/19 1349

## 2019-09-01 NOTE — ED Notes (Signed)
PT state he have not eaten today and felt sugar low. PT pulled from lobby CBG have been check

## 2019-09-01 NOTE — H&P (Signed)
History and Physical    [name] [name] YSA:[id number] DOB: [birth year] DOA: 09/01/2019  PCP: [name], MD   Patient coming from: Home.  I have personally briefly reviewed patient's old medical records in [place]  Chief Complaint: Foot wound and fracture.  HPI: [name] [name] is a 64 y.o. male with medical history significant of CAD/CABG, history of MI, chronic diastolic CHF, RBBB, history of ectopic atrial rhythm, hypertension, hyperlipidemia, intermittent hypokalemia, morbid obesity, nonalcoholic liver cirrhosis, stage II CKD, neuropathy, sleep apnea, type 2 diabetes, varicose veins of lower extremities who is coming to the emergency department referred by podiatry for surgery in the morning after sustaining a puncture wound on his left foot fourth toe with associated fracture.  He denies fever, chills, fatigue, malaise, sore throat, wheezing, dyspnea or hemoptysis.  Denies chest pain, palpitations, dizziness, diaphoresis, PND, orthopnea, but gets frequent lower extremity edema.  No abdominal pain, nausea, emesis, diarrhea, constipation, melena or hematochezia.  No dysuria, frequency or hematuria.  No polyuria, polydipsia, polyphagia or blurred vision.  ED Course: 98.1 F, pulse 103, respiration 18, blood pressure 162/82 mmHg O2 sat 95% on room air.  The patient received Ancef in the emergency department.  CBC was normal, except for a mildly elevated MCV of 101.7 fL BMP showed a potassium of 3.4 mmol/L and glucose 111 mg/dL.  Renal function was normal SARS coronavirus 2 was negative.  Review of Systems: As per HPI otherwise all other systems reviewed and are negative.  Past Medical History:  Diagnosis Date  . Acute myocardial infarction, unspecified site, episode of care unspecified 2005  . Acute pancreatitis   . CAD (coronary artery disease)    a. CABG in 2005 w LIMA to LAD, left radial to second circumflex marginal, saphenous vein graft to PDA, saphenous vein graft to  lateral subbrach of ramus intermediate, and sequential saphenous vein graft to the medial subbranch of ramus intermediate.  . Chronic diastolic CHF (congestive heart failure) ([place])   . Cirrhosis of liver without mention of alcohol   . CKD (chronic kidney disease), stage II   . Complications affecting other specified body systems, hypertension   . Ectopic atrial rhythm   . Essential hypertension   . Hyperlipidemia   . Hypokalemia    a. intermitttent noncompliance with potassium supplement.  . Hypomagnesemia   . Morbid obesity ([place])   . Neuropathy   . Other and unspecified hyperlipidemia   . Proteinuria   . RBBB   . Sleep apnea   . Type II or unspecified type diabetes mellitus without mention of complication, not stated as uncontrolled   . Varicose veins of both lower extremities    Past Surgical History:  Procedure Laterality Date  . CORONARY ARTERY BYPASS GRAFT     2005   Social History  reports that he quit smoking about 14 years ago. He has a 45.00 pack-year smoking history. He has never used smokeless tobacco. He reports current alcohol use. He reports that he does not use drugs.  Allergies  Allergen Reactions  . Ibuprofen Other (See Comments)    Stomach pain    Family History  Problem Relation Age of Onset  . Breast cancer Mother   . Prostate cancer Father   . Coronary artery disease Father        underwent CABG   Prior to Admission medications   Medication Sig Start Date End Date Taking? Authorizing Provider  ACCU-CHEK AVIVA PLUS test strip  02/18/16   [provider]  albuterol (PROVENTIL HFA;VENTOLIN HFA) 108 (90 Base) MCG/ACT inhaler Inhale 2 puffs into the lungs every 6 (six) hours as needed for wheezing or shortness of breath. 07/06/15   Baird Lyons D, MD  albuterol (PROVENTIL) (2.5 MG/3ML) 0.083% nebulizer solution Take 3 mLs (2.5 mg total) by nebulization every 6 (six) hours as needed for wheezing or shortness of breath. 04/29/13   Baird Lyons D, MD   aspirin EC 81 MG tablet Take 81 mg by mouth daily.     [provider]  B-D ULTRAFINE III SHORT PEN 31G X 8 MM MISC  03/11/16   [provider]  collagenase (SANTYL) ointment Apply 1 application topically daily. Right ankle measures 6.0 x 0.3cm. 07/15/19   Evelina Bucy, DPM  diclofenac Sodium (VOLTAREN) 1 % GEL APPLY 2 GRAMS TOPICALLY 4 TIMES DAILY TO KNEE 02/01/19   [name], MD  eplerenone (INSPRA) 25 MG tablet TAKE 1 TABLET(25 MG) BY MOUTH DAILY 08/03/19   Minus Breeding, MD  fluticasone (FLONASE) 50 MCG/ACT nasal spray Place 1 spray into both nostrils daily. 06/14/18   Baird Lyons D, MD  guaiFENesin (MUCINEX) 600 MG 12 hr tablet Take 2 tablets (1,200 mg total) by mouth 2 (two) times daily. 11/01/18   Martyn Ehrich, NP  HUMALOG KWIKPEN 200 UNIT/ML KwikPen SMARTSIG:28-100 Unit(s) SUB-Q Daily 07/02/19   [provider]  HUMULIN R U-500 KWIKPEN 500 UNIT/ML Claiborne Rigg  06/02/19   [provider]  HYDROcodone-homatropine (HYCODAN) 5-1.5 MG/5ML syrup Take 5 mLs by mouth every 6 (six) hours as needed for cough. 05/20/19   Baird Lyons D, MD  ipratropium (ATROVENT) 0.02 % nebulizer solution Take 2.5 mLs (0.5 mg total) by nebulization every 6 (six) hours as needed for wheezing or shortness of breath. 04/29/13   Deneise Lever, MD  Lancets (ACCU-CHEK MULTICLIX) lancets  02/15/16   [provider]  LANTUS SOLOSTAR 100 UNIT/ML Solostar Pen SMARTSIG:55 Unit(s) Topical Every Morning 07/05/19   [provider]  losartan (COZAAR) 50 MG tablet TAKE 1/2 TABLET BY MOUTH EVERY DAY 05/12/19   [name], MD  Magnesium Oxide 400 (240 Mg) MG TABS Take 1 tablet (400 mg total) by mouth in the morning and at bedtime. 06/06/19   Dunn, Dayna N, PA-C  MAGNESIUM-OXIDE 400 (241.3 Mg) MG tablet Take 1 tablet by mouth 2 (two) times daily. 06/06/19   [provider]  metoprolol tartrate (LOPRESSOR) 50 MG tablet TAKE 1 TABLET(50 MG) BY MOUTH TWICE DAILY 06/13/19    [name], MD  nitroGLYCERIN (NITROSTAT) 0.4 MG SL tablet Place 1 tablet (0.4 mg total) under the tongue every 5 (five) minutes as needed for chest pain. 04/04/19   [name], MD  potassium chloride SA (K-DUR) 20 MEQ tablet Take 2 tablets (40 mEq total) by mouth 3 (three) times daily. 06/24/18   Dunn, Nedra Hai, PA-C  predniSONE (DELTASONE) 10 MG tablet Take 2 tablets (20 mg total) by mouth daily with breakfast. 06/06/19   Newt Minion, MD  rosuvastatin (CRESTOR) 20 MG tablet Take 1 tablet (20 mg total) by mouth every evening. 06/24/18   Charlie Pitter, PA-C  SANTYL ointment  07/12/19   [provider]  torsemide (DEMADEX) 20 MG tablet TAKE 1 TABLET(20 MG) BY MOUTH TWICE DAILY 06/13/19   [name], MD  simvastatin (ZOCOR) 80 MG tablet Take 80 mg by mouth daily.    06/03/19  [provider]    Physical Exam: Vitals:  09/01/19 1456 09/01/19 1812 09/01/19 2006 09/01/19 2108  BP: (!) 162/82 (!) 165/54 (!) 146/89 (!) 139/91  Pulse: 103 88 91 100  Resp: 18 16 17 18   Temp: 98.1 F (36.7 C)     TempSrc: Oral     SpO2: 95% 95% 98% 100%    Constitutional: NAD, calm, comfortable Eyes: PERRL, lids and conjunctivae normal ENMT: Mucous membranes are moist. Posterior pharynx clear of any exudate or lesions. Neck: normal, supple, no masses, no thyromegaly Respiratory: clear to auscultation bilaterally, no wheezing, no crackles. Normal respiratory effort. No accessory muscle use.  Cardiovascular: Regular rate and rhythm, no murmurs / rubs / gallops.  2+ lower extremity pitting edema. 2+ pedal pulses. No carotid bruits.  Abdomen: Obese, nondistended.  BS positive.  Soft, no tenderness, no masses palpated. No hepatosplenomegaly.  Musculoskeletal: no clubbing / cyanosis. Good ROM, no contractures. Normal muscle tone.  Skin: Left fourth toe wound.  See picture below. Neurologic: CN 2-12 grossly intact. Sensation intact, DTR normal. Strength 5/5 in all 4.  Psychiatric: Normal  judgment and insight. Alert and oriented x 3. Normal mood.     Labs on Admission: I have personally reviewed following labs and imaging studies  CBC: Recent Labs  Lab 09/01/19 2150  WBC 8.1  NEUTROABS 3.4  HGB 15.9  HCT 49.1  MCV 101.7*  PLT 700    Basic Metabolic Panel: Recent Labs  Lab 09/01/19 2150  NA 142  K 3.4*  CL 104  CO2 27  GLUCOSE 111*  BUN 10  CREATININE 0.93  CALCIUM 9.3    GFR: CrCl cannot be calculated (Unknown ideal weight.).  Liver Function Tests: No results for input(s): AST, ALT, ALKPHOS, BILITOT, PROT, ALBUMIN in the last 168 hours.  Radiological Exams on Admission: DG Foot Complete Left  Result Date: 09/01/2019 Please see detailed radiograph report in office note.   EKG: Independently reviewed.   Assessment/Plan Principal Problem:   Open wound of left foot Observation/telemetry. Podiatry will intervene in the morning. No pain due to neuropathy at this time. Continue Ancef 2 g IVPB every 6 hours. Advised the patient about tight postop BG control  Active Problems:   HLD (hyperlipidemia) On Crestor.    Obstructive sleep apnea Continue CPAP at bedtime.    HTN (hypertension) Metoprolol 50 mg p.o. twice daily. Torsemide 20 mg p.o. twice daily. Losartan 25 mg p.o. daily. Monitor BP, HR, renal function electrolytes.    CAD (coronary artery disease) Continue aspirin 81 mg p.o. daily. Continue metoprolol 50 mg p.o. twice daily. Continue Crestor 20 mg p.o. in the evenings.    Type 2 diabetes mellitus (HCC) Hold morning insulin while NPO. CBG monitoring R ISS.    DVT prophylaxis: SCDs. Code Status:   Full code Family Communication: Disposition Plan:   Patient is from:  Home  Anticipated DC to:  Home.  Anticipated DC date:  09/02/2019.  Anticipated DC barriers: Postop recovery. Consults called:  Podiatry will be intervening today. Admission status:  Observation/MedSurg. Severity of Illness:  Medium to high given past  medical history, particularly diabetes and peripheral neuropathy.  Reubin Milan MD [facility]  How to contact the [facility] Attending or Consulting provider [place] or covering provider during after hours [place], for this patient?   1. Check the care team in [facility] and look for a) attending/consulting [facility] provider listed and b) the [facility] team listed 2. Log into [email or web address] and use [facility] universal password to access. If you do not  have the password, please contact the hospital operator. 3. Locate the [facility] provider you are looking for under [facility] and page to a number that you can be directly reached. 4. If you still have difficulty reaching the provider, please page the [facility] (Director on Call) for the Hospitalists listed on amion for assistance.  09/01/2019, 11:46 PM   This document was prepared using Dragon voice recognition software and may contain some unintended transcription errors.

## 2019-09-01 NOTE — ED Triage Notes (Signed)
Patient reports sent by Dr. Levada Dy at [facility] for admission and surgery on "split to bone on bottom of left foot." Denies pain at this time.

## 2019-09-02 ENCOUNTER — Encounter: Payer: Self-pay | Admitting: Internal Medicine

## 2019-09-02 ENCOUNTER — Encounter (HOSPITAL_COMMUNITY): Payer: Self-pay | Admitting: Internal Medicine

## 2019-09-02 ENCOUNTER — Encounter (HOSPITAL_COMMUNITY)
Admission: EM | Disposition: A | Discharge status: Home / Self Care | Payer: Self-pay | Source: Home / Self Care | Attending: Internal Medicine

## 2019-09-02 ENCOUNTER — Observation Stay (HOSPITAL_COMMUNITY): Payer: Medicare Other | Admitting: Certified Registered Nurse Anesthetist

## 2019-09-02 DIAGNOSIS — Z794 Long term (current) use of insulin: Secondary | ICD-10-CM | POA: Diagnosis not present

## 2019-09-02 DIAGNOSIS — I5032 Chronic diastolic (congestive) heart failure: Secondary | ICD-10-CM | POA: Diagnosis not present

## 2019-09-02 DIAGNOSIS — Z6841 Body Mass Index (BMI) 40.0 and over, adult: Secondary | ICD-10-CM | POA: Diagnosis not present

## 2019-09-02 DIAGNOSIS — I4510 Unspecified right bundle-branch block: Secondary | ICD-10-CM | POA: Diagnosis present

## 2019-09-02 DIAGNOSIS — Z7984 Long term (current) use of oral hypoglycemic drugs: Secondary | ICD-10-CM | POA: Diagnosis not present

## 2019-09-02 DIAGNOSIS — I10 Essential (primary) hypertension: Secondary | ICD-10-CM | POA: Diagnosis not present

## 2019-09-02 DIAGNOSIS — E876 Hypokalemia: Secondary | ICD-10-CM | POA: Diagnosis present

## 2019-09-02 DIAGNOSIS — E1169 Type 2 diabetes mellitus with other specified complication: Secondary | ICD-10-CM

## 2019-09-02 DIAGNOSIS — E1142 Type 2 diabetes mellitus with diabetic polyneuropathy: Secondary | ICD-10-CM | POA: Diagnosis present

## 2019-09-02 DIAGNOSIS — T148XXA Other injury of unspecified body region, initial encounter: Secondary | ICD-10-CM | POA: Diagnosis present

## 2019-09-02 DIAGNOSIS — S91105A Unspecified open wound of left lesser toe(s) without damage to nail, initial encounter: Secondary | ICD-10-CM | POA: Diagnosis not present

## 2019-09-02 DIAGNOSIS — I252 Old myocardial infarction: Secondary | ICD-10-CM | POA: Diagnosis not present

## 2019-09-02 DIAGNOSIS — I2510 Atherosclerotic heart disease of native coronary artery without angina pectoris: Secondary | ICD-10-CM

## 2019-09-02 DIAGNOSIS — E785 Hyperlipidemia, unspecified: Secondary | ICD-10-CM | POA: Diagnosis present

## 2019-09-02 DIAGNOSIS — Z23 Encounter for immunization: Secondary | ICD-10-CM | POA: Diagnosis not present

## 2019-09-02 DIAGNOSIS — S91302A Unspecified open wound, left foot, initial encounter: Secondary | ICD-10-CM | POA: Diagnosis not present

## 2019-09-02 DIAGNOSIS — S92535B Nondisplaced fracture of distal phalanx of left lesser toe(s), initial encounter for open fracture: Secondary | ICD-10-CM

## 2019-09-02 DIAGNOSIS — E1122 Type 2 diabetes mellitus with diabetic chronic kidney disease: Secondary | ICD-10-CM | POA: Diagnosis present

## 2019-09-02 DIAGNOSIS — I130 Hypertensive heart and chronic kidney disease with heart failure and stage 1 through stage 4 chronic kidney disease, or unspecified chronic kidney disease: Secondary | ICD-10-CM | POA: Diagnosis not present

## 2019-09-02 DIAGNOSIS — Z8249 Family history of ischemic heart disease and other diseases of the circulatory system: Secondary | ICD-10-CM | POA: Diagnosis not present

## 2019-09-02 DIAGNOSIS — I25708 Atherosclerosis of coronary artery bypass graft(s), unspecified, with other forms of angina pectoris: Secondary | ICD-10-CM | POA: Diagnosis present

## 2019-09-02 DIAGNOSIS — M4722 Other spondylosis with radiculopathy, cervical region: Secondary | ICD-10-CM | POA: Diagnosis present

## 2019-09-02 DIAGNOSIS — I509 Heart failure, unspecified: Secondary | ICD-10-CM | POA: Diagnosis not present

## 2019-09-02 DIAGNOSIS — Z803 Family history of malignant neoplasm of breast: Secondary | ICD-10-CM | POA: Diagnosis not present

## 2019-09-02 DIAGNOSIS — G4733 Obstructive sleep apnea (adult) (pediatric): Secondary | ICD-10-CM | POA: Diagnosis present

## 2019-09-02 DIAGNOSIS — E119 Type 2 diabetes mellitus without complications: Secondary | ICD-10-CM

## 2019-09-02 DIAGNOSIS — Z8042 Family history of malignant neoplasm of prostate: Secondary | ICD-10-CM | POA: Diagnosis not present

## 2019-09-02 DIAGNOSIS — W271XXA Contact with garden tool, initial encounter: Secondary | ICD-10-CM | POA: Diagnosis not present

## 2019-09-02 DIAGNOSIS — Z20822 Contact with and (suspected) exposure to covid-19: Secondary | ICD-10-CM | POA: Diagnosis not present

## 2019-09-02 DIAGNOSIS — Z951 Presence of aortocoronary bypass graft: Secondary | ICD-10-CM | POA: Diagnosis not present

## 2019-09-02 DIAGNOSIS — I110 Hypertensive heart disease with heart failure: Secondary | ICD-10-CM | POA: Diagnosis not present

## 2019-09-02 DIAGNOSIS — Z87891 Personal history of nicotine dependence: Secondary | ICD-10-CM | POA: Diagnosis not present

## 2019-09-02 DIAGNOSIS — S92532B Displaced fracture of distal phalanx of left lesser toe(s), initial encounter for open fracture: Secondary | ICD-10-CM | POA: Diagnosis not present

## 2019-09-02 DIAGNOSIS — N182 Chronic kidney disease, stage 2 (mild): Secondary | ICD-10-CM | POA: Diagnosis present

## 2019-09-02 DIAGNOSIS — K7460 Unspecified cirrhosis of liver: Secondary | ICD-10-CM | POA: Diagnosis present

## 2019-09-02 HISTORY — PX: I & D EXTREMITY: SHX5045

## 2019-09-02 LAB — COMPREHENSIVE METABOLIC PANEL
ALT: 13 U/L (ref 0–44)
AST: 17 U/L (ref 15–41)
Albumin: 3.3 g/dL — ABNORMAL LOW (ref 3.5–5.0)
Alkaline Phosphatase: 63 U/L (ref 38–126)
Anion gap: 10 (ref 5–15)
BUN: 11 mg/dL (ref 8–23)
CO2: 21 mmol/L — ABNORMAL LOW (ref 22–32)
Calcium: 8.9 mg/dL (ref 8.9–10.3)
Chloride: 108 mmol/L (ref 98–111)
Creatinine, Ser: 0.96 mg/dL (ref 0.61–1.24)
GFR calc Af Amer: >60 mL/min (ref >60)
GFR calc non Af Amer: >60 mL/min (ref >60)
Glucose, Bld: 173 mg/dL — ABNORMAL HIGH (ref 70–99)
Potassium: 3.8 mmol/L (ref 3.5–5.1)
Sodium: 139 mmol/L (ref 135–145)
Total Bilirubin: 1 mg/dL (ref 0.3–1.2)
Total Protein: 7.7 g/dL (ref 6.5–8.1)

## 2019-09-02 LAB — GLUCOSE, CAPILLARY
Glucose-Capillary: 180 mg/dL — ABNORMAL HIGH (ref 70–99)
Glucose-Capillary: 260 mg/dL — ABNORMAL HIGH (ref 70–99)
Glucose-Capillary: 247 mg/dL — ABNORMAL HIGH (ref 70–99)
Glucose-Capillary: 267 mg/dL — ABNORMAL HIGH (ref 70–99)
Glucose-Capillary: 214 mg/dL — ABNORMAL HIGH (ref 70–99)
Glucose-Capillary: 232 mg/dL — ABNORMAL HIGH (ref 70–99)

## 2019-09-02 LAB — HIV ANTIBODY (ROUTINE TESTING W REFLEX): HIV Screen 4th Generation wRfx: NONREACTIVE

## 2019-09-02 LAB — SURGICAL PCR SCREEN
MRSA, PCR: NEGATIVE
Staphylococcus aureus: NEGATIVE

## 2019-09-02 SURGERY — IRRIGATION AND DEBRIDEMENT EXTREMITY
Anesthesia: Monitor Anesthesia Care | Laterality: Left

## 2019-09-02 MED ORDER — EMPAGLIFLOZIN 10 MG PO TABS
10.0000 mg | ORAL_TABLET | Freq: Every day | ORAL | Status: DC
  Filled 2019-09-02: qty 1

## 2019-09-02 MED ORDER — PROCHLORPERAZINE EDISYLATE 10 MG/2ML IJ SOLN: 10.0000 mg | Freq: Four times a day (QID) | INTRAMUSCULAR | Status: DC | PRN

## 2019-09-02 MED ORDER — OXYCODONE HCL 5 MG PO TABS
ORAL_TABLET | ORAL | Status: AC
  Administered 2019-09-02: 5 mg via ORAL
  Filled 2019-09-02: qty 1

## 2019-09-02 MED ORDER — NITROGLYCERIN 0.4 MG SL SUBL: 0.4000 mg | SUBLINGUAL_TABLET | SUBLINGUAL | Status: DC | PRN

## 2019-09-02 MED ORDER — MIDAZOLAM HCL 2 MG/2ML IJ SOLN
INTRAMUSCULAR | Status: AC
  Filled 2019-09-02: qty 2

## 2019-09-02 MED ORDER — INSULIN ASPART 100 UNIT/ML ~~LOC~~ SOLN
0.0000 [IU] | Freq: Three times a day (TID) | SUBCUTANEOUS | Status: DC
  Administered 2019-09-02: 18:00:00 5 [IU] via SUBCUTANEOUS
  Administered 2019-09-02 – 2019-09-03 (×3): 8 [IU] via SUBCUTANEOUS

## 2019-09-02 MED ORDER — BUPIVACAINE HCL (PF) 0.5 % IJ SOLN
INTRAMUSCULAR | Status: AC
  Filled 2019-09-02: qty 30

## 2019-09-02 MED ORDER — PROPOFOL 500 MG/50ML IV EMUL
INTRAVENOUS | Status: DC | PRN
  Administered 2019-09-02: 75 ug/kg/min via INTRAVENOUS

## 2019-09-02 MED ORDER — ONDANSETRON HCL 4 MG/2ML IJ SOLN
INTRAMUSCULAR | Status: DC | PRN
Start: 2019-09-02 — End: 2019-09-02
  Administered 2019-09-02: 4 mg via INTRAVENOUS

## 2019-09-02 MED ORDER — SODIUM CHLORIDE 0.9 % IR SOLN
Status: DC | PRN
  Administered 2019-09-02: 3000 mL

## 2019-09-02 MED ORDER — POTASSIUM CHLORIDE IN NACL 20-0.9 MEQ/L-% IV SOLN
INTRAVENOUS | Status: DC
  Filled 2019-09-02 (×5): qty 1000

## 2019-09-02 MED ORDER — VANCOMYCIN HCL 1000 MG IV SOLR
INTRAVENOUS | Status: AC
  Filled 2019-09-02: qty 1000

## 2019-09-02 MED ORDER — FLUTICASONE PROPIONATE 50 MCG/ACT NA SUSP
1.0000 | Freq: Every day | NASAL | Status: DC
  Administered 2019-09-02 – 2019-09-03 (×2): 1 via NASAL
  Filled 2019-09-02: qty 16

## 2019-09-02 MED ORDER — METOPROLOL TARTRATE 50 MG PO TABS
50.0000 mg | ORAL_TABLET | Freq: Two times a day (BID) | ORAL | Status: DC
  Administered 2019-09-02 – 2019-09-03 (×4): 50 mg via ORAL
  Filled 2019-09-02 (×4): qty 1

## 2019-09-02 MED ORDER — MAGNESIUM OXIDE 400 (241.3 MG) MG PO TABS
400.0000 mg | ORAL_TABLET | Freq: Every day | ORAL | Status: DC
  Administered 2019-09-02 – 2019-09-03 (×2): 400 mg via ORAL
  Filled 2019-09-02 (×2): qty 1

## 2019-09-02 MED ORDER — CEFAZOLIN (ANCEF) 1 G IV SOLR: 2.0000 g | Freq: Four times a day (QID) | INTRAVENOUS | Status: DC

## 2019-09-02 MED ORDER — ALBUTEROL SULFATE (2.5 MG/3ML) 0.083% IN NEBU: 2.5000 mg | INHALATION_SOLUTION | Freq: Four times a day (QID) | RESPIRATORY_TRACT | Status: DC | PRN

## 2019-09-02 MED ORDER — ASPIRIN EC 81 MG PO TBEC
81.0000 mg | DELAYED_RELEASE_TABLET | Freq: Every day | ORAL | Status: DC
  Administered 2019-09-02 – 2019-09-03 (×2): 81 mg via ORAL
  Filled 2019-09-02 (×2): qty 1

## 2019-09-02 MED ORDER — ONDANSETRON HCL 4 MG/2ML IJ SOLN
INTRAMUSCULAR | Status: AC
  Filled 2019-09-02: qty 2

## 2019-09-02 MED ORDER — SPIRONOLACTONE 25 MG PO TABS
25.0000 mg | ORAL_TABLET | Freq: Every day | ORAL | Status: DC
  Administered 2019-09-02 – 2019-09-03 (×2): 25 mg via ORAL
  Filled 2019-09-02 (×2): qty 1

## 2019-09-02 MED ORDER — ONDANSETRON HCL 4 MG/2ML IJ SOLN: 4.0000 mg | Freq: Once | INTRAMUSCULAR | Status: DC | PRN

## 2019-09-02 MED ORDER — LOSARTAN POTASSIUM 25 MG PO TABS
25.0000 mg | ORAL_TABLET | Freq: Every day | ORAL | Status: DC
  Administered 2019-09-02 – 2019-09-03 (×2): 25 mg via ORAL
  Filled 2019-09-02 (×2): qty 1

## 2019-09-02 MED ORDER — 0.9 % SODIUM CHLORIDE (POUR BTL) OPTIME
TOPICAL | Status: DC | PRN
  Administered 2019-09-02: 1000 mL

## 2019-09-02 MED ORDER — MIDAZOLAM HCL 5 MG/5ML IJ SOLN
INTRAMUSCULAR | Status: DC | PRN
  Administered 2019-09-02: 2 mg via INTRAVENOUS

## 2019-09-02 MED ORDER — ACETAMINOPHEN 325 MG PO TABS
650.0000 mg | ORAL_TABLET | Freq: Four times a day (QID) | ORAL | Status: DC | PRN
  Administered 2019-09-03: 06:00:00 650 mg via ORAL
  Filled 2019-09-02: qty 2

## 2019-09-02 MED ORDER — TORSEMIDE 20 MG PO TABS
20.0000 mg | ORAL_TABLET | Freq: Two times a day (BID) | ORAL | Status: DC
  Administered 2019-09-03: 09:00:00 20 mg via ORAL
  Filled 2019-09-02 (×2): qty 1

## 2019-09-02 MED ORDER — TORSEMIDE 20 MG PO TABS
20.0000 mg | ORAL_TABLET | Freq: Two times a day (BID) | ORAL | Status: DC
  Administered 2019-09-02: 20 mg via ORAL
  Filled 2019-09-02: qty 1

## 2019-09-02 MED ORDER — VANCOMYCIN HCL 1000 MG IV SOLR
INTRAVENOUS | Status: DC | PRN
  Administered 2019-09-02: 1000 mg

## 2019-09-02 MED ORDER — CEFAZOLIN SODIUM-DEXTROSE 2-4 GM/100ML-% IV SOLN
2.0000 g | Freq: Four times a day (QID) | INTRAVENOUS | Status: DC
  Administered 2019-09-02 (×2): 2 g via INTRAVENOUS
  Administered 2019-09-02: 1 g via INTRAVENOUS
  Administered 2019-09-03 (×2): 2 g via INTRAVENOUS
  Filled 2019-09-02 (×6): qty 100

## 2019-09-02 MED ORDER — LIDOCAINE 2% (20 MG/ML) 5 ML SYRINGE
INTRAMUSCULAR | Status: AC
  Filled 2019-09-02: qty 5

## 2019-09-02 MED ORDER — PNEUMOCOCCAL VAC POLYVALENT 25 MCG/0.5ML IJ INJ
0.5000 mL | INJECTION | INTRAMUSCULAR | Status: DC
  Filled 2019-09-02: qty 0.5

## 2019-09-02 MED ORDER — OXYCODONE HCL 5 MG PO TABS: 5.0000 mg | ORAL_TABLET | Freq: Once | ORAL | Status: AC | PRN

## 2019-09-02 MED ORDER — POTASSIUM CHLORIDE CRYS ER 20 MEQ PO TBCR
40.0000 meq | EXTENDED_RELEASE_TABLET | Freq: Three times a day (TID) | ORAL | Status: DC
  Administered 2019-09-02: 40 meq via ORAL
  Filled 2019-09-02: qty 2

## 2019-09-02 MED ORDER — OXYCODONE HCL 5 MG PO TABS
5.0000 mg | ORAL_TABLET | ORAL | Status: DC | PRN
  Administered 2019-09-02 – 2019-09-03 (×4): 5 mg via ORAL
  Filled 2019-09-02 (×5): qty 1

## 2019-09-02 MED ORDER — ROSUVASTATIN CALCIUM 20 MG PO TABS
20.0000 mg | ORAL_TABLET | Freq: Every evening | ORAL | Status: DC
  Administered 2019-09-02: 20 mg via ORAL
  Filled 2019-09-02: qty 1

## 2019-09-02 MED ORDER — OXYCODONE HCL 5 MG/5ML PO SOLN: 5.0000 mg | Freq: Once | ORAL | Status: AC | PRN

## 2019-09-02 MED ORDER — BUPIVACAINE HCL (PF) 0.5 % IJ SOLN
INTRAMUSCULAR | Status: DC | PRN
  Administered 2019-09-02: 10 mL

## 2019-09-02 MED ORDER — LACTATED RINGERS IV SOLN: INTRAVENOUS | Status: DC | PRN

## 2019-09-02 MED ORDER — PROPOFOL 500 MG/50ML IV EMUL
INTRAVENOUS | Status: AC
  Filled 2019-09-02: qty 50

## 2019-09-02 MED ORDER — ACETAMINOPHEN 650 MG RE SUPP: 650.0000 mg | Freq: Four times a day (QID) | RECTAL | Status: DC | PRN

## 2019-09-02 MED ORDER — PROPOFOL 10 MG/ML IV BOLUS
INTRAVENOUS | Status: AC
  Filled 2019-09-02: qty 20

## 2019-09-02 MED ORDER — DICLOFENAC SODIUM 1 % EX GEL
2.0000 g | Freq: Four times a day (QID) | CUTANEOUS | Status: DC
  Administered 2019-09-02 – 2019-09-03 (×2): 2 g via TOPICAL
  Filled 2019-09-02: qty 100

## 2019-09-02 MED ORDER — FENTANYL CITRATE (PF) 100 MCG/2ML IJ SOLN: 25.0000 ug | INTRAMUSCULAR | Status: DC | PRN

## 2019-09-02 SURGICAL SUPPLY — 43 items
BLADE SURG 15 STRL LF DISP TIS (BLADE) ×1 IMPLANT
BLADE SURG 15 STRL SS (BLADE) ×3
BNDG ELASTIC 4X5.8 VLCR STR LF (GAUZE/BANDAGES/DRESSINGS) ×3 IMPLANT
BNDG ELASTIC 6X5.8 VLCR STR LF (GAUZE/BANDAGES/DRESSINGS) ×3 IMPLANT
CLEANER TIP ELECTROSURG 2X2 (MISCELLANEOUS) ×3 IMPLANT
CNTNR URN SCR LID CUP LEK RST (MISCELLANEOUS) ×1 IMPLANT
CONT SPEC 4OZ STRL OR WHT (MISCELLANEOUS) ×3
COVER MAYO STAND STRL (DRAPES) ×3 IMPLANT
COVER SURGICAL LIGHT HANDLE (MISCELLANEOUS) ×3 IMPLANT
COVER WAND RF STERILE (DRAPES) IMPLANT
CUFF TOURN SGL QUICK 24 (TOURNIQUET CUFF) ×3
CUFF TRNQT CYL 24X4X16.5-23 (TOURNIQUET CUFF) ×1 IMPLANT
DECANTER SPIKE VIAL GLASS SM (MISCELLANEOUS) ×3 IMPLANT
DRSG PAD ABDOMINAL 8X10 ST (GAUZE/BANDAGES/DRESSINGS) ×2 IMPLANT
GAUZE SPONGE 4X4 12PLY STRL (GAUZE/BANDAGES/DRESSINGS) ×5 IMPLANT
GAUZE XEROFORM 1X8 LF (GAUZE/BANDAGES/DRESSINGS) ×2 IMPLANT
GLOVE BIO SURGEON STRL SZ7.5 (GLOVE) ×3 IMPLANT
GLOVE BIOGEL PI IND STRL 8 (GLOVE) ×1 IMPLANT
GLOVE BIOGEL PI INDICATOR 8 (GLOVE) ×2
GOWN STRL REUS W/TWL XL LVL3 (GOWN DISPOSABLE) ×3 IMPLANT
HANDPIECE INTERPULSE COAX TIP (DISPOSABLE) ×3
KIT BASIN OR (CUSTOM PROCEDURE TRAY) ×3 IMPLANT
KIT TURNOVER KIT A (KITS) IMPLANT
MARKER SKIN DUAL TIP RULER LAB (MISCELLANEOUS) ×3 IMPLANT
NDL HYPO 25X1 1.5 SAFETY (NEEDLE) ×1 IMPLANT
NEEDLE HYPO 25X1 1.5 SAFETY (NEEDLE) ×3 IMPLANT
PACK ORTHO EXTREMITY (CUSTOM PROCEDURE TRAY) ×3 IMPLANT
PADDING UNDERCAST 2 STRL (CAST SUPPLIES) ×2
PADDING UNDERCAST 2X4 STRL (CAST SUPPLIES) ×1 IMPLANT
PENCIL SMOKE EVACUATOR (MISCELLANEOUS) IMPLANT
SET HNDPC FAN SPRY TIP SCT (DISPOSABLE) IMPLANT
SOL PREP POV-IOD 4OZ 10% (MISCELLANEOUS) ×2 IMPLANT
SOL PREP PROV IODINE SCRUB 4OZ (MISCELLANEOUS) ×2 IMPLANT
SUT ETHILON 4 0 PS 2 18 (SUTURE) ×2 IMPLANT
SUT MNCRL AB 4-0 PS2 18 (SUTURE) ×2 IMPLANT
SUT PROLENE 4 0 PS 2 18 (SUTURE) ×3 IMPLANT
SUT VIC AB 1 CT1 27 (SUTURE) ×3
SUT VIC AB 1 CT1 27XBRD ANTBC (SUTURE) ×1 IMPLANT
SYR 20ML LL LF (SYRINGE) ×3 IMPLANT
TOWEL OR 17X26 10 PK STRL BLUE (TOWEL DISPOSABLE) ×3 IMPLANT
TOWEL OR NON WOVEN STRL DISP B (DISPOSABLE) ×3 IMPLANT
TRAY PREP A LATEX SAFE STRL (SET/KITS/TRAYS/PACK) ×3 IMPLANT
UNDERPAD 30X36 HEAVY ABSORB (UNDERPADS AND DIAPERS) ×12 IMPLANT

## 2019-09-02 NOTE — Brief Op Note (Signed)
09/02/2019  1:58 PM  PATIENT:  [name]  64 y.o. male  PRE-OPERATIVE DIAGNOSIS:  4th toe infection  POST-OPERATIVE DIAGNOSIS:  4th toe infection  PROCEDURE:  Procedure(s): IRRIGATION AND DEBRIDEMENT 4th TOE DEBRIDIMENT (Left)  SURGEON:  Surgeon(s) and Role:    * Evelina Bucy, DPM - Primary  PHYSICIAN ASSISTANT:   ASSISTANTS: none   ANESTHESIA:   local and MAC  EBL:  5 ml   BLOOD ADMINISTERED:none  DRAINS: none   LOCAL MEDICATIONS USED:  MARCAINE    and Amount: 10 ml  SPECIMEN:  none  DISPOSITION OF SPECIMEN:  n/a  COUNTS:  YES  TOURNIQUET:  * No tourniquets in log *  DICTATION: .Dragon Dictation  PLAN OF CARE: transfer to floor  PATIENT DISPOSITION:  PACU - hemodynamically stable.   Delay start of Pharmacological VTE agent (>24hrs) due to surgical blood loss or risk of bleeding: not applicable

## 2019-09-02 NOTE — Transfer of Care (Signed)
Immediate Anesthesia Transfer of Care Note  Patient: [name] [name]  Procedure(s) Performed: IRRIGATION AND DEBRIDEMENT 4th TOE DEBRIDIMENT (Left )  Patient Location: PACU  Anesthesia Type:MAC  Level of Consciousness: awake, alert  and oriented  Airway & Oxygen Therapy: Patient Spontanous Breathing and Patient connected to face mask oxygen  Post-op Assessment: Report given to RN and Post -op Vital signs reviewed and stable  Post vital signs: Reviewed and stable  Last Vitals:  Vitals Value Taken Time  BP 137/78 09/02/19 1402  Temp    Pulse 92 09/02/19 1404  Resp 11 09/02/19 1403  SpO2 98 % 09/02/19 1404  Vitals shown include unvalidated device data.  Last Pain:  Vitals:   09/02/19 1159  TempSrc: Oral  PainSc:          Complications: No complications documented.

## 2019-09-02 NOTE — Progress Notes (Signed)
Patient refusing CPAP at this time. Vitals stable. Informed patient if he changed his mind to let us know.

## 2019-09-02 NOTE — Anesthesia Postprocedure Evaluation (Signed)
Anesthesia Post Note  Patient: [name] [name]  Procedure(s) Performed: IRRIGATION AND DEBRIDEMENT 4th TOE DEBRIDIMENT (Left )     Patient location during evaluation: PACU Anesthesia Type: MAC Level of consciousness: awake and alert Pain management: pain level controlled Vital Signs Assessment: post-procedure vital signs reviewed and stable Respiratory status: spontaneous breathing, nonlabored ventilation and respiratory function stable Cardiovascular status: blood pressure returned to baseline and stable Postop Assessment: no apparent nausea or vomiting Anesthetic complications: no   No complications documented.  Last Vitals:  Vitals:   09/02/19 1430 09/02/19 1453  BP: (!) 138/69 114/65  Pulse: 88 86  Resp: 15 15  Temp: (!) 36.4 C (!) 36.4 C  SpO2: 95% 99%    Last Pain:  Vitals:   09/02/19 1415  TempSrc:   PainSc: 3                  Lidia Collum

## 2019-09-02 NOTE — Anesthesia Procedure Notes (Signed)
Date/Time: 09/02/2019 1:13 PM Performed by: Sharlette Dense, CRNA Oxygen Delivery Method: Simple face mask

## 2019-09-02 NOTE — Op Note (Signed)
Patient Name: [name] [name] DOB: [birth year]  MRN: [id number]   Date of Service: 09/02/19  Surgeon: Dr. Hardie Pulley, DPM Assistants: None Pre-operative Diagnosis:  Open fracture left 4th toe, laceration toe Post-operative Diagnosis:  same Procedures:  1) Debridement open fracture left 4th toe  2) Repair complex laceration Pathology/Specimens: * No specimens in log * Anesthesia: MAC/local Hemostasis: * No tourniquets in log * Estimated Blood Loss: 5 ml Materials: * No implants in log * Medications: 1g Vancomycin powder, topical Complications: none  Indications for Procedure:  This is a 64 y.o. male who experienced an accidental injury to his left 4th toe on 08/31/19. The injury went down to the bone with a small fracture avulsion of the bone. It was discussed with the patient that due to the contaminated injury to the toe with exposed bone he would benefit from debridement and irrigation with wound closure. All risks, benefits, and alternatives discussed no guarantees given.   Procedure in Detail: Patient was identified in pre-operative holding area. Formal consent was signed and the left lower extremity was marked. Patient was brought back to the operating room. Anesthesia was induced. The extremity was prepped and draped in the usual sterile fashion. Timeout was taken to confirm patient name, laterality, and procedure prior to incision.   Attention was then directed to the left 4th toe. There was a grossly contaminated laceration to the 4th toe. There was exposed bone in the wound bed. The wound edges were friable with some delayed capillary refill. The wound was copiously irrigated with 3L of normal saline with pulse lavage. The wound was then excisionally debrided with rongeur to bleeding viable tissue. The wound was debrided down to but not including bone. The wound was irrigated again. The laceration measured 4.5 cm in length and extended from the distal tip of the toe to the toe  sulcus.  The wound was then closed in layers with 3-0 monocryl, 4-0 nylon mattress suture.  The foot was then dressed with xeroform, 4x4, kling, ACE bandage. Patient tolerated the procedure well.   Disposition: Following a period of post-operative monitoring, patient will be transferred to he floor. We will monitor the toe for viability and signs of infection. He is at high risk of further infection and may need amputation if the digit experiences infection or slow or non-healing.

## 2019-09-02 NOTE — Progress Notes (Signed)
  Subjective:  Patient ID: CADYN FANN, male    DOB: [birth year],  MRN: [id number]  Seen this AM and again in pre-op. Denies complaints. Objective:   Vitals:   09/02/19 0932 09/02/19 1159  BP: (!) 140/75 (!) 134/82  Pulse: 94 93  Resp:  18  Temp:  97.7 F (36.5 C)  SpO2:  97%   General AA&O x3. Normal mood and affect.  Vascular Dorsalis pedis and posterior tibial pulses 2/4 bilat. Brisk capillary refill to all digits. Pedal hair present.  Neurologic Epicritic sensation diminished to the level to the digits  Dermatologic Dressing intact left foot  Orthopedic: Motor intact left foot    Assessment & Plan:  Patient was evaluated and treated and all questions answered.  Open Fracture Left 4th Toe -Continue IV ancef -To OR today today for left 4th toe debridement and irrigation with wound closure. -All risks, benefits of surgery discussed. -Plan for discharge tomorrow morning  Evelina Bucy, DPM  Accessible via secure chat for questions or concerns.

## 2019-09-02 NOTE — Progress Notes (Signed)
[facility] PROGRESS NOTE   [name] [name] OFB:[id number] DOB: 15-Nov-1955 DOA: 09/01/2019  PCP: [name], MD  Brief History/Interval Summary: 64 y.o. male with medical history significant of CAD/CABG, history of MI, chronic diastolic CHF, RBBB, history of ectopic atrial rhythm, hypertension, hyperlipidemia, intermittent hypokalemia, morbid obesity, nonalcoholic liver cirrhosis, stage II CKD, neuropathy, sleep apnea, type 2 diabetes, varicose veins of lower extremities who presented to the emergency department referred by podiatry for surgery in the morning after sustaining a puncture wound on his left foot fourth toe with associated fracture.    Reason for Visit: Left fourth toe laceration  Consultants: Podiatry  Procedures: Surgery planned for this afternoon  Antibiotics: Anti-infectives (From admission, onward)   Start     Dose/Rate Route Frequency Ordered Stop   09/02/19 0600  ceFAZolin (ANCEF) IVPB 2g/100 mL premix     Discontinue     2 g 200 mL/hr over 30 Minutes Intravenous Every 6 hours 09/02/19 0411     09/02/19 0400  ceFAZolin (ANCEF) powder 2 g  Status:  Discontinued        2 g Other Every 6 hours 09/02/19 0348 09/02/19 0411   09/01/19 2145  ceFAZolin (ANCEF) IVPB 2g/100 mL premix        2 g 200 mL/hr over 30 Minutes Intravenous  Once 09/01/19 2133 09/01/19 2331      Subjective/Interval History: Patient denies any pain in the left foot. Feeling a little anxious about the upcoming surgery. Denies any chest pain or shortness of breath.     Assessment/Plan:  Open wound of the left fourth toe Stepped on a metal rake resulting in this injury. Patient was seen by his podiatrist and he will need washout of this wound due to high risk of developing osteomyelitis. He would subsequently need wound closure. Patient does not have significant pain most likely secondary to neuropathy from his diabetes. He remains on IV antibacterials. Plan is for surgical  intervention later today.  Diabetes mellitus type 2 in obese Patient on Humulin R U500 at home. This is being held. Hold his other diabetic medications as well. SSI.  10.3 and August 2020. We will recheck tomorrow.  Essential hypertension Continue with antihypertensives for now. He is noted to be on metoprolol torsemide and spironolactone. He is also on Cozaar.  History of coronary artery disease He has had bypass previously. He is noted to be on aspirin beta blocker statin. From 2019 showed normal systolic function.  Obstructive sleep apnea CPAP.  Morbid obesity Estimated body mass index is 42.12 kg/m as calculated from the following:   Height as of this encounter: 5\' 11"  (1.803 m).   Weight as of this encounter: 137 kg.    DVT Prophylaxis: Start DVT prophylaxis after surgery per surgeon. Code Status: Full code Family Communication: Discussed with the patient Disposition Plan:  Status is: Observation  The patient will require care spanning > 2 midnights and should be moved to inpatient because: IV treatments appropriate due to intensity of illness or inability to take PO  Dispo: The patient is from: Home              Anticipated d/c is to: Home              Anticipated d/c date is: 1 day              Patient currently is not medically stable to d/c.       Medications:  Scheduled: . aspirin EC  81 mg Oral Daily  . diclofenac Sodium  2 g Topical QID  . empagliflozin  10 mg Oral Daily  . fluticasone  1 spray Each Nare Daily  . insulin aspart  0-15 Units Subcutaneous TID WC  . losartan  25 mg Oral Daily  . magnesium oxide  400 mg Oral Daily  . metoprolol tartrate  50 mg Oral BID  . [START ON 09/03/2019] pneumococcal 23 valent vaccine  0.5 mL Intramuscular Tomorrow-1000  . potassium chloride SA  40 mEq Oral TID  . rosuvastatin  20 mg Oral QPM  . spironolactone  25 mg Oral Daily  . torsemide  20 mg Oral BID   Continuous: . 0.9 % NaCl with KCl 20 mEq / L 88 mL/hr at  09/02/19 0103  .  ceFAZolin (ANCEF) IV 2 g (09/02/19 0523)   DXI:[id number] **OR** acetaminophen, albuterol, nitroGLYCERIN, oxyCODONE, prochlorperazine   Objective:  Vital Signs  Vitals:   09/01/19 2108 09/02/19 0030 09/02/19 0525 09/02/19 0932  BP: (!) 139/91 (!) 158/95 (!) 141/80 (!) 140/75  Pulse: 100 92 93 94  Resp: 18 18 18    Temp:  98.2 F (36.8 C) 98.3 F (36.8 C)   TempSrc:  Oral Oral   SpO2: 100% 100% 93%   Weight:    (!) 137 kg  Height:    5\' 11"  (1.803 m)    Intake/Output Summary (Last 24 hours) at 09/02/2019 1137 Last data filed at 09/02/2019 1000 Gross per 24 hour  Intake 961.16 ml  Output 1930 ml  Net -968.84 ml   Filed Weights   09/02/19 0932  Weight: (!) 137 kg    General appearance: Awake alert.  In no distress. Obese Resp: Clear to auscultation bilaterally.  Normal effort Cardio: S1-S2 is normal regular.  No S3-S4.  No rubs murmurs or bruit GI: Abdomen is soft.  Nontender nondistended.  Bowel sounds are present normal.  No masses organomegaly Extremities: No edema. Left foot covered in dressing. Dry wound noted over the right foot. Neurologic: Alert and oriented x3.  No focal neurological deficits.    Lab Results:  Data Reviewed: I have personally reviewed following labs and imaging studies  CBC: Recent Labs  Lab 09/01/19 2150  WBC 8.1  NEUTROABS 3.4  HGB 15.9  HCT 49.1  MCV 101.7*  PLT 419    Basic Metabolic Panel: Recent Labs  Lab 09/01/19 2150 09/02/19 0445  NA 142 139  K 3.4* 3.8  CL 104 108  CO2 27 21*  GLUCOSE 111* 173*  BUN 10 11  CREATININE 0.93 0.96  CALCIUM 9.3 8.9    GFR: Estimated Creatinine Clearance: 108.5 mL/min (by C-G formula based on SCr of 0.96 mg/dL).  Liver Function Tests: Recent Labs  Lab 09/02/19 0445  AST 17  ALT 13  ALKPHOS 63  BILITOT 1.0  PROT 7.7  ALBUMIN 3.3*     BNP (last 3 results) Recent Labs    10/07/18 1447 06/03/19 1245  PROBNP 80.0 367    HbA1C: No results for  input(s): HGBA1C in the last 72 hours.  CBG: Recent Labs  Lab 09/01/19 2004 09/01/19 2106 09/02/19 0522  GLUCAP 95 117* 180*     Recent Results (from the past 240 hour(s))  SARS Coronavirus 2 by RT PCR (hospital order, performed in [facility] hospital lab) Nasopharyngeal Nasopharyngeal Swab     Status: None   Collection Time: 09/01/19  9:50 PM   Specimen: Nasopharyngeal Swab  Result Value Ref Range Status   SARS  Coronavirus 2 NEGATIVE NEGATIVE Final    Comment: (NOTE) SARS-CoV-2 target nucleic acids are NOT DETECTED.  The SARS-CoV-2 RNA is generally detectable in upper and lower respiratory specimens during the acute phase of infection. The lowest concentration of SARS-CoV-2 viral copies this assay can detect is 250 copies / mL. A negative result does not preclude SARS-CoV-2 infection and should not be used as the sole basis for treatment or other patient management decisions.  A negative result may occur with improper specimen collection / handling, submission of specimen other than nasopharyngeal swab, presence of viral mutation(s) within the areas targeted by this assay, and inadequate number of viral copies (<250 copies / mL). A negative result must be combined with clinical observations, patient history, and epidemiological information.  Fact Sheet for Patients:   StrictlyIdeas.no  Fact Sheet for Healthcare Providers: BankingDealers.co.za  This test is not yet approved or  cleared by the [place] FDA and has been authorized for detection and/or diagnosis of SARS-CoV-2 by FDA under an Emergency Use Authorization (EUA).  This EUA will remain in effect (meaning this test can be used) for the duration of the COVID-19 declaration under Section 564(b)(1) of the Act, 21 U.S.C. section 360bbb-3(b)(1), unless the authorization is terminated or revoked sooner.  Performed at [facility], [place] [address] [place], [place] [address]   Surgical pcr screen     Status: None   Collection Time: 09/02/19  5:29 AM   Specimen: Nasal Mucosa; Nasal Swab  Result Value Ref Range Status   MRSA, PCR NEGATIVE NEGATIVE Final   Staphylococcus aureus NEGATIVE NEGATIVE Final    Comment: (NOTE) The Xpert SA Assay (FDA approved for NASAL specimens in patients 47 years of age and older), is one component of a comprehensive surveillance program. It is not intended to diagnose infection nor to guide or monitor treatment. Performed at [facility], [place] [address] [place], [place] [address]       Radiology Studies: DG Foot Complete Left  Result Date: 09/01/2019 Please see detailed radiograph report in office note.      LOS: 0 days   Demaris Leavell [employer] on [email or web address]  09/02/2019, 11:37 AM

## 2019-09-02 NOTE — Anesthesia Preprocedure Evaluation (Signed)
Anesthesia Evaluation  Patient identified by MRN, date of birth, ID band Patient awake    Reviewed: Allergy & Precautions, NPO status , Patient's Chart, lab work & pertinent test results  History of Anesthesia Complications Negative for: history of anesthetic complications  Airway Mallampati: II  TM Distance: >3 FB Neck ROM: Full    Dental  (+) Edentulous Upper, Missing, Poor Dentition, Loose,    Pulmonary sleep apnea , former smoker,    Pulmonary exam normal        Cardiovascular hypertension, + CAD, + Past MI (2005), + CABG, + Peripheral Vascular Disease and +CHF  Normal cardiovascular exam     Neuro/Psych negative neurological ROS  negative psych ROS   GI/Hepatic negative GI ROS, (+) Cirrhosis       ,   Endo/Other  diabetes, Type 2Morbid obesity  Renal/GU Renal InsufficiencyRenal disease  negative genitourinary   Musculoskeletal negative musculoskeletal ROS (+)   Abdominal   Peds  Hematology negative hematology ROS (+)   Anesthesia Other Findings  Echo 2019:  - Left ventricle: The cavity size was normal. Wall thickness was  normal. Systolic function was normal. The estimated ejection  fraction was in the range of 50% to 55%. Left ventricular  diastolic function parameters were normal.  - Mitral valve: There was mild regurgitation.  - Right ventricle: The cavity size was mildly dilated.  - Right atrium: The atrium was moderately dilated.   Normal stress test 2015  Reproductive/Obstetrics                             Anesthesia Physical Anesthesia Plan  ASA: III  Anesthesia Plan: MAC   Post-op Pain Management:    Induction: Intravenous  PONV Risk Score and Plan: 1 and Propofol infusion, TIVA and Treatment may vary due to age or medical condition  Airway Management Planned: Natural Airway, Nasal Cannula and Simple Face Mask  Additional Equipment: None  Intra-op  Plan:   Post-operative Plan:   Informed Consent: I have reviewed the patients History and Physical, chart, labs and discussed the procedure including the risks, benefits and alternatives for the proposed anesthesia with the patient or authorized representative who has indicated his/her understanding and acceptance.       Plan Discussed with:   Anesthesia Plan Comments:         Anesthesia Quick Evaluation

## 2019-09-03 ENCOUNTER — Encounter (HOSPITAL_COMMUNITY): Payer: Self-pay | Admitting: Podiatry

## 2019-09-03 LAB — CBC
HCT: 44.9 % (ref 39.0–52.0)
Hemoglobin: 14.6 g/dL (ref 13.0–17.0)
MCH: 33.5 pg (ref 26.0–34.0)
MCHC: 32.5 g/dL (ref 30.0–36.0)
MCV: 103 fL — ABNORMAL HIGH (ref 80.0–100.0)
Platelets: 189 10*3/uL (ref 150–400)
RBC: 4.36 MIL/uL (ref 4.22–5.81)
RDW: 14.6 % (ref 11.5–15.5)
WBC: 7.6 10*3/uL (ref 4.0–10.5)
nRBC: 0 % (ref 0.0–0.2)

## 2019-09-03 LAB — GLUCOSE, CAPILLARY
Glucose-Capillary: 221 mg/dL — ABNORMAL HIGH (ref 70–99)
Glucose-Capillary: 268 mg/dL — ABNORMAL HIGH (ref 70–99)
Glucose-Capillary: 298 mg/dL — ABNORMAL HIGH (ref 70–99)

## 2019-09-03 LAB — BASIC METABOLIC PANEL
Anion gap: 9 (ref 5–15)
BUN: 14 mg/dL (ref 8–23)
CO2: 25 mmol/L (ref 22–32)
Calcium: 8.7 mg/dL — ABNORMAL LOW (ref 8.9–10.3)
Chloride: 103 mmol/L (ref 98–111)
Creatinine, Ser: 1.23 mg/dL (ref 0.61–1.24)
GFR calc Af Amer: >60 mL/min (ref >60)
GFR calc non Af Amer: >60 mL/min (ref >60)
Glucose, Bld: 211 mg/dL — ABNORMAL HIGH (ref 70–99)
Potassium: 3.9 mmol/L (ref 3.5–5.1)
Sodium: 137 mmol/L (ref 135–145)

## 2019-09-03 LAB — HEMOGLOBIN A1C
Hgb A1c MFr Bld: 10.1 % — ABNORMAL HIGH (ref 4.8–5.6)
Mean Plasma Glucose: 243.17 mg/dL

## 2019-09-03 LAB — MAGNESIUM: Magnesium: 2.2 mg/dL (ref 1.7–2.4)

## 2019-09-03 MED ORDER — OXYCODONE HCL 5 MG PO TABS: 5.0000 mg | ORAL_TABLET | ORAL | 0 refills | Status: DC | PRN

## 2019-09-03 MED ORDER — CEPHALEXIN 500 MG PO CAPS: 500.0000 mg | ORAL_CAPSULE | Freq: Four times a day (QID) | ORAL | 0 refills | Status: AC

## 2019-09-03 MED ORDER — POLYETHYLENE GLYCOL 3350 17 G PO PACK: 17.0000 g | PACK | Freq: Every day | ORAL | Status: DC

## 2019-09-03 MED ORDER — POLYETHYLENE GLYCOL 3350 17 G PO PACK
17.0000 g | PACK | Freq: Once | ORAL | Status: AC
  Administered 2019-09-03: 14:00:00 17 g via ORAL
  Filled 2019-09-03: qty 1

## 2019-09-03 MED ORDER — POLYETHYLENE GLYCOL 3350 17 G PO PACK: 17.0000 g | PACK | Freq: Every day | ORAL | 0 refills | Status: DC

## 2019-09-03 NOTE — Progress Notes (Signed)
Patient ok for discharge today with pain medication and po keflex x1 week.   Please message with questions or concerns.

## 2019-09-03 NOTE — Plan of Care (Signed)

## 2019-09-03 NOTE — TOC Initial Note (Signed)
Transition of Care [facility]) - Initial/Assessment Note    Patient Details  Name: TALYN DESSERT MRN: [id number] Date of Birth: 1955-03-05  Transition of Care [facility]) CM/SW Contact:    Joaquin Courts, RN Phone Number: 09/03/2019, 11:27 AM  Clinical Narrative:         Adapt to provide rolling walker.           Expected Discharge Plan: Home/Self Care Barriers to Discharge: No Barriers Identified   Patient Goals and CMS Choice Patient states their goals for this hospitalization and ongoing recovery are:: to go home      Expected Discharge Plan and Services Expected Discharge Plan: Home/Self Care   Discharge Planning Services: CM Consult   Living arrangements for the past 2 months: Single Family Home Expected Discharge Date: 09/03/19               DME Arranged: Gilford Rile rolling DME Agency: [employer] Date DME Agency Contacted: 09/03/19 Time DME Agency Contacted: [phone] Representative spoke with at DME Agency: [place] Arranged: NA          Prior Living Arrangements/Services Living arrangements for the past 2 months: [place]   Patient language and need for interpreter reviewed:: Yes Do you feel safe going back to the place where you live?: Yes      Need for Family Participation in Patient Care: Yes (Comment) Care giver support system in place?: Yes (comment)   Criminal Activity/Legal Involvement Pertinent to Current Situation/Hospitalization: No - Comment as needed  Activities of Daily Living Home Assistive Devices/Equipment: Blood pressure cuff, CBG Meter ADL Screening (condition at time of admission) Patient's cognitive ability adequate to safely complete daily activities?: Yes Is the patient deaf or have difficulty hearing?: No Does the patient have difficulty seeing, even when wearing glasses/contacts?: No Does the patient have difficulty concentrating, remembering, or making decisions?: No Patient able to express need for assistance with ADLs?:  No Does the patient have difficulty dressing or bathing?: No Independently performs ADLs?: Yes (appropriate for developmental age) Does the patient have difficulty walking or climbing stairs?: Yes Weakness of Legs: Both Weakness of Arms/Hands: None  Permission Sought/Granted                  Emotional Assessment Appearance:: Appears stated age Attitude/Demeanor/Rapport: Engaged Affect (typically observed): Accepting Orientation: : Oriented to Self, Oriented to Place, Oriented to  Time, Oriented to Situation   Psych Involvement: No (comment)  Admission diagnosis:  Open wound of left foot [S91.302A] Open nondisplaced fracture of distal phalanx of lesser toe of left foot, initial encounter [S92.535B] Open wound [T14.8XXA] Patient Active Problem List   Diagnosis Date Noted  . Open wound 09/02/2019  . CAD (coronary artery disease)   . Type 2 diabetes mellitus ([place])   . Open nondisplaced fracture of distal phalanx of lesser toe of left foot   . Open wound of fourth toe of left foot   . Open wound of left foot 09/01/2019  . Bilateral lower extremity edema 10/27/2018  . Type 2 diabetes mellitus with other specified complication ([place]), followed by Dr. Buddy Duty 10/14/2018  . Chronic heart failure with preserved ejection fraction ([place]) 08/29/2017  . Ectopic atrial rhythm 05/10/2017  . Hypokalemia 05/10/2017  . Varicose vein of leg 04/13/2017  . Peripheral vascular disease ([place]) 04/13/2017  . Primary osteoarthritis of both knees 01/21/2017  . Coronary artery disease involving native coronary artery of native heart without angina pectoris 10/29/2016  . Seasonal and perennial allergic rhinitis  07/17/2016  . Ulnar neuritis, left 03/27/2016  . Osteoarthritis of spine with radiculopathy, cervical region 03/27/2016  . PVD (peripheral vascular disease) ([place]) 04/30/2009  . HLD (hyperlipidemia) 01/04/2007  . Hepatic cirrhosis ([place]) 01/04/2007  . Obstructive sleep apnea 01/04/2007  . HTN  (hypertension) 01/04/2007   PCP:  [name], MD Pharmacy:   [facility] [place], [place] [address] [place] [address] Phone: [phone] Fax: [phone]     Social Determinants of Health (SDOH) Interventions    Readmission Risk Interventions No flowsheet data found.

## 2019-09-03 NOTE — Progress Notes (Signed)
Patient stable and pain is controlled. He is being d/c home with family. All instructions were clear and understood.

## 2019-09-03 NOTE — Discharge Instructions (Signed)
Deep Skin Avulsion A deep skin avulsion is a type of open wound. It often results from a severe injury (trauma) that tears away all layers of the skin or an entire body part. The areas of the body that are most often affected include the face, lips, ears, nose, and fingers. A deep skin avulsion may make structures below the skin become visible. You may be able to see muscle, bone, nerves, and blood vessels. A deep skin avulsion can also damage important structures beneath the skin. These include bones, tendons, nerves, or blood vessels. What are the causes? This condition may be caused by an injury, such as:  Being crushed.  Falling against a jagged surface.  An animal bite.  A gunshot wound.  A severe burn.  Being dragged, such as in a bicycle or motorcycle accident. What are the signs or symptoms? Symptoms of this condition include:  Pain.  Numbness.  Swelling.  Bleeding, which may be heavy. How is this diagnosed? This condition may be diagnosed with a medical history and physical exam. You may also have X-rays done. How is this treated? Treatment for this condition depends on how large and deep the wound is and where it is located. Treatment usually starts with:  Controlling the bleeding.  Washing out the wound with a germ-free (sterile) solution. After initial treatment, the wound may be closed or left open to heal.  Wounds that are small and clean may be closed with stitches (sutures).  Wounds that cannot be closed with sutures may be covered with a piece of skin (graft) or your own skin flap.  Wounds that are hard to close or that may become infected may be left open. These wounds heal over time from the inside out. You may also be treated with medicine, such as:  Antibiotic medicine.  Pain medicine.  Tetanus shot. Follow these instructions at home: Medicines  If you were prescribed an antibiotic medicine, take or apply it as told by your health care  provider. Do not stop taking or using the antibiotic even if your condition improves.  Take over-the-counter and prescription medicines only as told by your health care provider.  If you were prescribed a pain medicine, take it 30 minutes or more before you do any wound care, or as told by your health care provider.  Do not drive or use heavy machinery while taking prescription pain medicine. Wound care   Follow instructions from your health care provider about how to take care of your wound. Make sure you: ? Wash your hands with soap and water before you change your bandage (dressing). If soap and water are not available, use hand sanitizer. ? Change your dressing as told by your health care provider. ? Leave stitches (sutures), skin glue, or adhesive strips in place. These skin closures may need to stay in place for 2 weeks or longer. If adhesive strip edges start to loosen and curl up, you may trim the loose edges. Do not remove adhesive strips completely unless your health care provider tells you to do that.  Check your wound every day for signs of infection. Check for: ? Redness, swelling, or pain. ? Fluid or blood. ? Warmth. ? Pus or a bad smell.  Keep your dressing clean and dry. Do not take baths, swim, use a hot tub, or do anything that would put your wound under water until your health care provider approves.  Clean the wound each day, or as told by your health care  provider: ? Wash the wound with mild soap and water. ? Rinse the wound with water to remove all soap. ? Pat the wound dry with a clean towel. Do not rub it. ? Cover the wound with a clean dressing.  Do not scratch or pick at the wound. General instructions  Raise (elevate) the injured area above the level of your heart while you are sitting or lying down.  Do not use any products that contain nicotine or tobacco, such as cigarettes and e-cigarettes. These may delay wound healing. If you need help quitting, ask  your health care provider.  Eat a healthy diet that includes foods rich in protein, vitamin A, and vitamin C to help your wound heal.  Keep all follow-up visits as told by your health care provider. This is important. Contact a health care provider if:  You have: ? Pain that does not get better with medicine. ? More redness, swelling, or pain around your wound. ? More fluid or blood coming from your wound. ? A fever.  You got a tetanus shot and you have swelling, severe pain, redness, or bleeding at the injection site.  You are nauseous or you vomit.  You notice something coming out of the wound, such as wood or glass. Get help right away if:  You have a red streak going away from your wound.  A wound that was closed breaks open.  The wound is bleeding, and the bleeding does not stop with gentle pressure.  You have trouble breathing.  The wound is on your hand or foot and: ? You cannot properly move a finger or toe. ? Your fingers or toes look pale or bluish. Summary  A deep skin avulsion is a type of injury that can damage important structures beneath the skin.  A wound may be closed or left open to heal. This depends on the size and location of the wound and whether it is likely to become infected.  Follow instructions from your health care provider about how to take care of your wound.  Contact your health care provider if you have increased redness, swelling, or pain around your wound, or your pain is not controlled with medicine. This information is not intended to replace advice given to you by your health care provider. Make sure you discuss any questions you have with your health care provider. Document Revised: 01/22/2017 Document Reviewed: 01/22/2017 Elsevier Patient Education  2020 [employer].

## 2019-09-03 NOTE — Discharge Summary (Addendum)
[facility]  Physician Discharge Summary   Patient ID: [name] [name] MRN: [id number] DOB/AGE: 1955/02/27 64 y.o.  Admit date: 09/01/2019 Discharge date: 09/03/2019  PCP: [name], MD  DISCHARGE DIAGNOSES:  Open wound left fourth toe status post surgery Diabetes mellitus type 2 in obese Essential hypertension History of coronary artery disease Obstructive sleep apnea Morbid obesity  RECOMMENDATIONS FOR OUTPATIENT FOLLOW UP: 1. Follow-up with podiatry as instructed by them   Home Health: None Equipment/Devices: Rolling walker  CODE STATUS: Full code  DISCHARGE CONDITION: fair  Diet recommendation: Modified carbohydrate  INITIAL HISTORY: 64 y.o.malewith medical history significant ofCAD/CABG, history of MI, chronic diastolic CHF, RBBB, history of ectopic atrial rhythm, hypertension, hyperlipidemia, intermittent hypokalemia, morbid obesity, nonalcoholic liver cirrhosis, stage II CKD, neuropathy, sleep apnea, type 2 diabetes, varicose veins of lower extremities who presented to the emergency departmentreferred by podiatry for surgery in the morningafter sustaining a puncture wound on his left footfourth toe with associated fracture.  Consultations:  Dr. March Rummage with podiatry  Procedures: IRRIGATION AND DEBRIDEMENT 4th TOE [place] (Left)   HOSPITAL COURSE:   Open wound of the left fourth toe Stepped on a metal rake resulting in this injury. Patient was seen by his podiatrist.  Due to risk of developing osteomyelitis he was thought to require a washout of his wound.  Patient was hospitalized and placed on cefepime.  He underwent surgery yesterday.  Has done well.  Outpatient follow-up with podiatry.  Will be discharged on Keflex.  He has ambulated postoperatively.  Diabetes mellitus type 2 in obese May resume home medication regimen.  Essential hypertension Continue with home medicines.  History of coronary artery disease He has had bypass  previously. He is noted to be on aspirin beta blocker statin.  Echocardiogram from 2019 showed normal systolic function.  Stable.  Obstructive sleep apnea CPAP.  Morbid obesity Estimated body mass index is 42.12 kg/m as calculated from the following:   Height as of this encounter: 5\' 11"  (1.803 m).   Weight as of this encounter: 137 kg.   Overall stable.  Okay for discharge home today.  PERTINENT LABS:  The results of significant diagnostics from this hospitalization (including imaging, microbiology, ancillary and laboratory) are listed below for reference.    Microbiology: Recent Results (from the past 240 hour(s))  SARS Coronavirus 2 by RT PCR (hospital order, performed in [facility] hospital lab) Nasopharyngeal Nasopharyngeal Swab     Status: None   Collection Time: 09/01/19  9:50 PM   Specimen: Nasopharyngeal Swab  Result Value Ref Range Status   SARS Coronavirus 2 NEGATIVE NEGATIVE Final    Comment: (NOTE) SARS-CoV-2 target nucleic acids are NOT DETECTED.  The SARS-CoV-2 RNA is generally detectable in upper and lower respiratory specimens during the acute phase of infection. The lowest concentration of SARS-CoV-2 viral copies this assay can detect is 250 copies / mL. A negative result does not preclude SARS-CoV-2 infection and should not be used as the sole basis for treatment or other patient management decisions.  A negative result may occur with improper specimen collection / handling, submission of specimen other than nasopharyngeal swab, presence of viral mutation(s) within the areas targeted by this assay, and inadequate number of viral copies (<250 copies / mL). A negative result must be combined with clinical observations, patient history, and epidemiological information.  Fact Sheet for Patients:   StrictlyIdeas.no  Fact Sheet for Healthcare Providers: BankingDealers.co.za  This test is not yet approved or   cleared by the  [place] Architectural technologist and has been authorized for detection and/or diagnosis of SARS-CoV-2 by FDA under an Print production planner (EUA).  This EUA will remain in effect (meaning this test can be used) for the duration of the COVID-19 declaration under Section 564(b)(1) of the Act, 21 U.S.C. section 360bbb-3(b)(1), unless the authorization is terminated or revoked sooner.  Performed at [facility], [place] [address], [place], [place] [address]   Surgical pcr screen     Status: None   Collection Time: 09/02/19  5:29 AM   Specimen: Nasal Mucosa; Nasal Swab  Result Value Ref Range Status   MRSA, PCR NEGATIVE NEGATIVE Final   Staphylococcus aureus NEGATIVE NEGATIVE Final    Comment: (NOTE) The Xpert SA Assay (FDA approved for NASAL specimens in patients 47 years of age and older), is one component of a comprehensive surveillance program. It is not intended to diagnose infection nor to guide or monitor treatment. Performed at [facility], [place] [address], [place], [place] [address]      Labs:  COVID-19 Labs   Lab Results  Component Value Date   [place] 09/01/2019   [place] NEGATIVE 12/24/2018      Basic Metabolic Panel: Recent Labs  Lab 09/01/19 2150 09/02/19 0445 09/03/19 0607  NA 142 139 137  K 3.4* 3.8 3.9  CL 104 108 103  CO2 27 21* 25  GLUCOSE 111* 173* 211*  BUN 10 11 14   CREATININE 0.93 0.96 1.23  CALCIUM 9.3 8.9 8.7*  MG  --   --  2.2   Liver Function Tests: Recent Labs  Lab 09/02/19 0445  AST 17  ALT 13  ALKPHOS 63  BILITOT 1.0  PROT 7.7  ALBUMIN 3.3*   CBC: Recent Labs  Lab 09/01/19 2150 09/03/19 0607  WBC 8.1 7.6  NEUTROABS 3.4  --   HGB 15.9 14.6  HCT 49.1 44.9  MCV 101.7* 103.0*  PLT 225 189    CBG: Recent Labs  Lab 09/02/19 1631 09/02/19 2115 09/03/19 0548 09/03/19 0756 09/03/19 1147  GLUCAP 214* 232* 221* 268* 298*      DISCHARGE  EXAMINATION: Vitals:   09/03/19 0116 09/03/19 0640 09/03/19 0914 09/03/19 1401  BP: 111/73 113/77 (!) 112/60 93/69  Pulse: 86 100 98 90  Resp: 18 18 19 19   Temp: 98.1 F (36.7 C) 97.9 F (36.6 C) 98 F (36.7 C) 97.6 F (36.4 C)  TempSrc: Oral Oral Oral Oral  SpO2: 90% 90% 94% 90%  Weight:      Height:       General appearance: Awake alert.  In no distress Resp: Clear to auscultation bilaterally.  Normal effort Cardio: S1-S2 is normal regular.  No S3-S4.  No rubs murmurs or bruit GI: Abdomen is soft.  Nontender nondistended.  Bowel sounds are present normal.  No masses organomegaly Extremities: Left foot covered in dressing.   DISPOSITION: Home  Discharge Instructions    Call MD for:  difficulty breathing, headache or visual disturbances   Complete by: As directed    Call MD for:  extreme fatigue   Complete by: As directed    Call MD for:  hives   Complete by: As directed    Call MD for:  persistant dizziness or light-headedness   Complete by: As directed    Call MD for:  persistant nausea and vomiting   Complete by: As directed    Call MD for:  severe uncontrolled pain   Complete by: As directed  Call MD for:  temperature >100.4   Complete by: As directed    Discharge instructions   Complete by: As directed    Please be sure to follow-up with Dr. March Rummage as per his instructions.  Take your medications as prescribed including the antibiotics.  You were cared for by a hospitalist during your hospital stay. If you have any questions about your discharge medications or the care you received while you were in the hospital after you are discharged, you can call the unit and asked to speak with the hospitalist on call if the hospitalist that took care of you is not available. Once you are discharged, your primary care physician will handle any further medical issues. Please note that NO REFILLS for any discharge medications will be authorized once you are discharged, as it is  imperative that you return to your primary care physician (or establish a relationship with a primary care physician if you do not have one) for your aftercare needs so that they can reassess your need for medications and monitor your lab values. If you do not have a primary care physician, you can call [phone] for a physician referral.   Increase activity slowly   Complete by: As directed    No wound care   Complete by: As directed         Allergies as of 09/03/2019      Reactions   Ibuprofen Other (See Comments)   Stomach pain      Medication List    STOP taking these medications   guaiFENesin 600 MG 12 hr tablet Commonly known as: Mucinex   HYDROcodone-homatropine 5-1.5 MG/5ML syrup Commonly known as: HYCODAN   ipratropium 0.02 % nebulizer solution Commonly known as: ATROVENT   predniSONE 10 MG tablet Commonly known as: DELTASONE     TAKE these medications   Accu-Chek Aviva Plus test strip Generic drug: glucose blood   accu-chek multiclix lancets   albuterol (2.5 MG/3ML) 0.083% nebulizer solution Commonly known as: PROVENTIL Take 3 mLs (2.5 mg total) by nebulization every 6 (six) hours as needed for wheezing or shortness of breath.   albuterol 108 (90 Base) MCG/ACT inhaler Commonly known as: VENTOLIN HFA Inhale 2 puffs into the lungs every 6 (six) hours as needed for wheezing or shortness of breath.   aspirin EC 81 MG tablet Take 81 mg by mouth daily.   B-D ULTRAFINE III SHORT PEN 31G X 8 MM Misc Generic drug: Insulin Pen Needle   cephALEXin 500 MG capsule Commonly known as: Keflex Take 1 capsule (500 mg total) by mouth 4 (four) times daily for 7 days.   diclofenac Sodium 1 % Gel Commonly known as: VOLTAREN APPLY 2 GRAMS TOPICALLY 4 TIMES DAILY TO KNEE What changed: See the new instructions.   eplerenone 25 MG tablet Commonly known as: INSPRA TAKE 1 TABLET(25 MG) BY MOUTH DAILY What changed: See the new instructions.   fluticasone 50 MCG/ACT nasal  spray Commonly known as: FLONASE Place 1 spray into both nostrils daily.   HumuLIN R U-500 KwikPen 500 UNIT/ML kwikpen Generic drug: insulin regular human CONCENTRATED Inject 50-60 Units into the skin See admin instructions. 60 units in the AM, 50 units at lunch and 50 in the evening   Jardiance 10 MG Tabs tablet Generic drug: empagliflozin Take 10 mg by mouth daily.   losartan 50 MG tablet Commonly known as: COZAAR TAKE 1/2 TABLET BY MOUTH EVERY DAY   Magnesium Oxide 400 (240 Mg) MG Tabs Take 1  tablet (400 mg total) by mouth in the morning and at bedtime.   metoprolol tartrate 50 MG tablet Commonly known as: LOPRESSOR TAKE 1 TABLET(50 MG) BY MOUTH TWICE DAILY What changed: See the new instructions.   nitroGLYCERIN 0.4 MG SL tablet Commonly known as: NITROSTAT Place 1 tablet (0.4 mg total) under the tongue every 5 (five) minutes as needed for chest pain.   oxyCODONE 5 MG immediate release tablet Commonly known as: Oxy IR/ROXICODONE Take 1 tablet (5 mg total) by mouth every 4 (four) hours as needed for up to 5 days for moderate pain.   polyethylene glycol 17 g packet Commonly known as: MIRALAX / GLYCOLAX Take 17 g by mouth daily.   potassium chloride SA 20 MEQ tablet Commonly known as: KLOR-CON Take 2 tablets (40 mEq total) by mouth 3 (three) times daily.   rosuvastatin 20 MG tablet Commonly known as: CRESTOR Take 1 tablet (20 mg total) by mouth every evening.   Santyl ointment Generic drug: collagenase Apply 1 application topically daily. Right ankle measures 6.0 x 0.3cm.   torsemide 20 MG tablet Commonly known as: DEMADEX TAKE 1 TABLET(20 MG) BY MOUTH TWICE DAILY What changed: See the new instructions.            Durable Medical Equipment  (From admission, onward)         Start     Ordered   09/03/19 1122  For home use only DME Walker rolling  Once       Question Answer Comment  Walker: With [place] Wheels   Patient needs a walker to treat with the  following condition Physical deconditioning      09/03/19 1121            Follow-up Information    Price, Christian Mate, DPM. Schedule an appointment as soon as possible for a visit in 1 week(s).   Specialty: Podiatry Contact information: [address] [address] [phone]               TOTAL DISCHARGE TIME: 57 minutes  [place]  [facility] Pager on [email or web address]  09/03/2019, 2:06 PM

## 2019-09-05 ENCOUNTER — Other Ambulatory Visit: Payer: Self-pay

## 2019-09-05 ENCOUNTER — Encounter: Payer: Self-pay | Admitting: Podiatry

## 2019-09-05 ENCOUNTER — Ambulatory Visit (INDEPENDENT_AMBULATORY_CARE_PROVIDER_SITE_OTHER): Payer: Medicare Other | Admitting: Podiatry

## 2019-09-05 ENCOUNTER — Ambulatory Visit: Payer: Medicare Other | Admitting: Podiatry

## 2019-09-05 DIAGNOSIS — Z794 Long term (current) use of insulin: Secondary | ICD-10-CM

## 2019-09-05 DIAGNOSIS — I739 Peripheral vascular disease, unspecified: Secondary | ICD-10-CM

## 2019-09-05 DIAGNOSIS — S92535D Nondisplaced fracture of distal phalanx of left lesser toe(s), subsequent encounter for fracture with routine healing: Secondary | ICD-10-CM

## 2019-09-05 DIAGNOSIS — E1169 Type 2 diabetes mellitus with other specified complication: Secondary | ICD-10-CM

## 2019-09-05 NOTE — Progress Notes (Signed)
  Subjective:  Patient ID: [name] [name], male    DOB: [birth year],  MRN: [id number]  Chief Complaint  Patient presents with  . Wound Check    L 4th toe. DOS 09/02/19 - wound debridement and irrigation. Pt stated, "Taking oxycodone. 8/10 shooting pain without the oxycodone. The dressing unravelled today. We didn't see pus or notice an odor when we wrapped it. Wearing the surgical shoe". No fever/chills/N&V/foul odor.  . Follow-up    R ankle. Pt stated, "Dr. March [name] looked at it when I was in the hospital. I would like another surgical shoe - my shoe is rubbing against the wound".    64 y.o. male presents with the above complaint. History confirmed with patient.  Dressing came unraveled today and he came in for a dressing change.  He also requests a surgical shoe for the right foot as his current shoes are rubbing his anterior ankle which has a small abrasion.  Objective:  Physical Exam: warm, good capillary refill, sutures intact to left fourth toe with some dried blood around them, no signs of infection, right anterior ankle has small abrasion with surrounding callus.   Assessment:   1. Open nondisplaced fracture of distal phalanx of lesser toe of left foot with routine healing, subsequent encounter   2. Type 2 diabetes mellitus with other specified complication, with long-term current use of insulin ([place])   3. PVD (peripheral vascular disease) ([place])      Plan:  Patient was evaluated and treated and all questions answered.  -Left foot was redressed with a sterile gauze postoperative dressing.  He will follow up with Dr. March [name] in 1 week -Surgical shoe was dispensed for the right foot to offload the anterior ankle  [name] [name], DPM 09/05/2019    Return in about 1 week (around 09/12/2019) for with Dr [name] [name].

## 2019-09-08 ENCOUNTER — Telehealth: Payer: Self-pay | Admitting: *Deleted

## 2019-09-08 ENCOUNTER — Other Ambulatory Visit: Payer: Self-pay | Admitting: Physician Assistant

## 2019-09-08 DIAGNOSIS — Z794 Long term (current) use of insulin: Secondary | ICD-10-CM | POA: Diagnosis not present

## 2019-09-08 DIAGNOSIS — R609 Edema, unspecified: Secondary | ICD-10-CM | POA: Diagnosis not present

## 2019-09-08 DIAGNOSIS — E1142 Type 2 diabetes mellitus with diabetic polyneuropathy: Secondary | ICD-10-CM | POA: Diagnosis not present

## 2019-09-08 DIAGNOSIS — E1165 Type 2 diabetes mellitus with hyperglycemia: Secondary | ICD-10-CM | POA: Diagnosis not present

## 2019-09-08 MED ORDER — OXYCODONE HCL 5 MG PO TABS: 5.0000 mg | ORAL_TABLET | ORAL | 0 refills | Status: AC | PRN

## 2019-09-08 NOTE — Telephone Encounter (Signed)
Pt states had surgery last week and is having stinging pain and needs refill of the pain medication.

## 2019-09-08 NOTE — Telephone Encounter (Signed)
Pain medication refilled for post operative pain

## 2019-09-09 DIAGNOSIS — J45998 Other asthma: Secondary | ICD-10-CM | POA: Diagnosis not present

## 2019-09-15 ENCOUNTER — Other Ambulatory Visit: Payer: Self-pay

## 2019-09-15 ENCOUNTER — Ambulatory Visit (INDEPENDENT_AMBULATORY_CARE_PROVIDER_SITE_OTHER): Payer: Medicare Other | Admitting: Podiatry

## 2019-09-15 DIAGNOSIS — Z9889 Other specified postprocedural states: Secondary | ICD-10-CM

## 2019-09-15 DIAGNOSIS — I739 Peripheral vascular disease, unspecified: Secondary | ICD-10-CM

## 2019-09-15 DIAGNOSIS — Z794 Long term (current) use of insulin: Secondary | ICD-10-CM

## 2019-09-15 DIAGNOSIS — S92535D Nondisplaced fracture of distal phalanx of left lesser toe(s), subsequent encounter for fracture with routine healing: Secondary | ICD-10-CM

## 2019-09-15 DIAGNOSIS — E08621 Diabetes mellitus due to underlying condition with foot ulcer: Secondary | ICD-10-CM

## 2019-09-15 DIAGNOSIS — L97512 Non-pressure chronic ulcer of other part of right foot with fat layer exposed: Secondary | ICD-10-CM

## 2019-09-15 DIAGNOSIS — E1169 Type 2 diabetes mellitus with other specified complication: Secondary | ICD-10-CM

## 2019-09-15 NOTE — Progress Notes (Signed)
  Subjective:  Patient ID: SLAYTON LUBITZ, male    DOB: February 16, 1955,  MRN: [id number]  Chief Complaint  Patient presents with  . Routine Post Op     POV #2 DOS 09/02/2019 IRRIGATION AND DEBRIDEMENT 4th TOE DEBRIDIMENT    64 y.o. male presents with the above complaint. History confirmed with patient.  Dressing came unraveled today and he came in for a dressing change.  He also requests a surgical shoe for the right foot as his current shoes are rubbing his anterior ankle which has a small abrasion.  Objective:  Physical Exam: warm, good capillary refill, sutures intact to left fourth toe with some dried blood around them, no signs of infection, right anterior ankle has small abrasion with surrounding callus.   Assessment:   1. Open nondisplaced fracture of distal phalanx of lesser toe of left foot with routine healing, subsequent encounter   2. Type 2 diabetes mellitus with other specified complication, with long-term current use of insulin ([place])   3. PVD (peripheral vascular disease) ([place])   4. Diabetic ulcer of other part of right foot associated with diabetes mellitus due to underlying condition, with fat layer exposed ([place])   5. Post-operative state      Plan:  Patient was evaluated and treated and all questions answered.  -Left foot was redressed with a sterile gauze postoperative dressing.  He will follow up with Dr. March Rummage in 1 week -Surgical shoe was dispensed for the right foot to offload the anterior ankle -Sutures left intact and will hopefully remove next week  Lanae Crumbly, DPM 09/15/2019    Return in about 1 week (around 09/22/2019) for wound re-check, post op visit with Dr March Rummage.

## 2019-09-18 ENCOUNTER — Other Ambulatory Visit: Payer: Self-pay | Admitting: Podiatry

## 2019-09-18 MED ORDER — OXYCODONE HCL 5 MG PO TABS: 5.0000 mg | ORAL_TABLET | Freq: Four times a day (QID) | ORAL | 0 refills | Status: DC | PRN

## 2019-09-18 NOTE — Progress Notes (Signed)
Patient called 09/18/2019 at 10:30am stating that he needed a refill of pain medication. He states it feels like the nerves are waking up. They are changing the bandage and there is no pus and he states to foot looks good. No fevers, chills or any other systemic concerns. Discussed if there are any changes to let us know.

## 2019-09-27 ENCOUNTER — Other Ambulatory Visit: Payer: Self-pay

## 2019-09-27 ENCOUNTER — Ambulatory Visit (INDEPENDENT_AMBULATORY_CARE_PROVIDER_SITE_OTHER): Payer: Medicare Other | Admitting: Podiatry

## 2019-09-27 DIAGNOSIS — S92535D Nondisplaced fracture of distal phalanx of left lesser toe(s), subsequent encounter for fracture with routine healing: Secondary | ICD-10-CM

## 2019-09-27 MED ORDER — SILVER SULFADIAZINE 1 % EX CREA
TOPICAL_CREAM | CUTANEOUS | 0 refills | Status: DC
Start: 2019-09-27 — End: 2020-01-12

## 2019-09-27 NOTE — Progress Notes (Signed)
  Subjective:  Patient ID: [name] [name], male    DOB: [birth year],  MRN: [id number]  Chief Complaint  Patient presents with  . Routine Post Op    POV Pt denies any concerns and denies fever/chills/nausea/vomiting.    DOS: 09/02/19 Procedure: left fourth toe open fx debridment and closure   64 y.o. male presents with the above complaint. History confirmed with patient.   Objective:  Physical Exam: no tenderness at the surgical site, local edema noted and calf supple, nontender. Incision: small area of incomplete healing plantarly  Assessment:   1. Open nondisplaced fracture of distal phalanx of lesser toe of left foot with routine healing, subsequent encounter     Plan:  Patient was evaluated and treated and all questions answered.  Post-operative State -Sutures removed -Dressing applied consisting of silvadene, sterile gauze, kerlix and ACE bandage  No follow-ups on file.

## 2019-09-30 ENCOUNTER — Telehealth: Payer: Self-pay | Admitting: Internal Medicine

## 2019-09-30 NOTE — Telephone Encounter (Signed)
Called and spoke with Aisha from [facility] and she states that they faxed over forms for perforomist and albuterol on 8/18 and they are needing them signed and sent back to them. She verified fax number and is also going to refax them now so that we got them. I will forward this to Dr. Annamaria Boots to be on the look out for forms to sign.

## 2019-10-01 MED ORDER — OXYCODONE HCL 5 MG PO TABS: 5.0000 mg | ORAL_TABLET | Freq: Four times a day (QID) | ORAL | 0 refills | Status: DC | PRN

## 2019-10-01 NOTE — Addendum Note (Signed)
Addended by: Boneta Lucks on: 10/01/2019 06:01 PM   Modules accepted: Orders

## 2019-10-03 DIAGNOSIS — J45998 Other asthma: Secondary | ICD-10-CM | POA: Diagnosis not present

## 2019-10-03 NOTE — Telephone Encounter (Signed)
ATC [employer] and rang rang rang no one answered,   No forms are in Dr. Janee Morn mail folder on this patient

## 2019-10-04 DIAGNOSIS — E1165 Type 2 diabetes mellitus with hyperglycemia: Secondary | ICD-10-CM | POA: Diagnosis not present

## 2019-10-04 DIAGNOSIS — E1142 Type 2 diabetes mellitus with diabetic polyneuropathy: Secondary | ICD-10-CM | POA: Diagnosis not present

## 2019-10-04 DIAGNOSIS — Z794 Long term (current) use of insulin: Secondary | ICD-10-CM | POA: Diagnosis not present

## 2019-10-04 NOTE — Telephone Encounter (Signed)
Forms have been faxed with successful status

## 2019-10-04 NOTE — Telephone Encounter (Signed)
I have signed the forms. The fax number on them is [phone]. They are on C-pod, ready to be faxed out to [facility]

## 2019-10-04 NOTE — Telephone Encounter (Signed)
Aisha from [facility] checking on forms to be filled out for Performist and Albuterol. Aisha phone number is [phone]. Fax number is [phone].

## 2019-10-05 ENCOUNTER — Telehealth: Payer: Self-pay | Admitting: Internal Medicine

## 2019-10-05 DIAGNOSIS — J45998 Other asthma: Secondary | ICD-10-CM | POA: Diagnosis not present

## 2019-10-05 NOTE — Telephone Encounter (Signed)
Call [employer] 8.26

## 2019-10-06 DIAGNOSIS — J45998 Other asthma: Secondary | ICD-10-CM | POA: Diagnosis not present

## 2019-10-06 NOTE — Telephone Encounter (Signed)
Tried to call number provided but received a message stating that the call could not be completed as dialed.  Called our local [place] office at [phone] and spoke with [facility] for help. Stated to her the message we received from the call and stated to her when tried to dial number that was given, am not able to get through. Bethanne Ginger was able to locate and saw that pt was started on a new cpap machine and they were needing compliance report from the new cpap start. Stated to Bethanne Ginger that pt no showed at his last OV 5/14 and she verbalized understanding.  Attempted to call pt but line went straight to VM. Left message for pt to return call. When pt returns call, we need to get pt scheduled for a follow up for cpap compliance with either Dr. Annamaria Boots or an APP.

## 2019-10-11 ENCOUNTER — Other Ambulatory Visit: Payer: Self-pay | Admitting: Podiatry

## 2019-10-11 ENCOUNTER — Ambulatory Visit (INDEPENDENT_AMBULATORY_CARE_PROVIDER_SITE_OTHER): Payer: Medicare Other | Admitting: Podiatry

## 2019-10-11 ENCOUNTER — Ambulatory Visit (INDEPENDENT_AMBULATORY_CARE_PROVIDER_SITE_OTHER): Payer: Medicare Other

## 2019-10-11 ENCOUNTER — Other Ambulatory Visit: Payer: Self-pay

## 2019-10-11 ENCOUNTER — Encounter: Payer: Self-pay | Admitting: Podiatry

## 2019-10-11 DIAGNOSIS — S92535D Nondisplaced fracture of distal phalanx of left lesser toe(s), subsequent encounter for fracture with routine healing: Secondary | ICD-10-CM

## 2019-10-11 DIAGNOSIS — M79672 Pain in left foot: Secondary | ICD-10-CM

## 2019-10-11 DIAGNOSIS — S91302A Unspecified open wound, left foot, initial encounter: Secondary | ICD-10-CM

## 2019-10-11 MED ORDER — OXYCODONE HCL 5 MG PO TABS: 5.0000 mg | ORAL_TABLET | Freq: Four times a day (QID) | ORAL | 0 refills | Status: DC | PRN

## 2019-10-11 NOTE — Telephone Encounter (Signed)
lmtcb for pt - pt needs OV with APP or CY for CPAP follow up.

## 2019-10-11 NOTE — Progress Notes (Signed)
  Subjective:  Patient ID: [name] [name], male    DOB: 23-Nov-1955,  MRN: [id number]  Chief Complaint  Patient presents with  . Toe Injury    F/U appt., left 4th toe has a sharp pain    DOS: 09/02/19 Procedure: Left fourth toe open fx debridement   64 y.o. male presents with the above complaint. History confirmed with patient. Denies significant issues states the toe hurts to walk on from time to time.  Objective:  Physical Exam: tenderness at the surgical site, no edema noted and calf supple, nontender. Incision: small area of incomplete healing  No images are attached to the encounter.  Radiographs: X-ray of the left foot: no fracture, dislocation, swelling or degenerative changes noted   Assessment:   1. Wound of left foot   2. Open nondisplaced fracture of distal phalanx of lesser toe of left foot with routine healing, subsequent encounter     Plan:  Patient was evaluated and treated and all questions answered.  Post-operative State -XR taken and reviewed -Wound gently debrided -Dress with silvadene and band-aid daily -Refill pain rx  Return in about 4 weeks (around 11/08/2019) for Post-Op (No XRs).

## 2019-10-12 ENCOUNTER — Other Ambulatory Visit: Payer: Self-pay | Admitting: Family Medicine

## 2019-10-12 DIAGNOSIS — E1165 Type 2 diabetes mellitus with hyperglycemia: Secondary | ICD-10-CM | POA: Diagnosis not present

## 2019-10-12 DIAGNOSIS — I25708 Atherosclerosis of coronary artery bypass graft(s), unspecified, with other forms of angina pectoris: Secondary | ICD-10-CM

## 2019-10-12 LAB — HEMOGLOBIN A1C
Hemoglobin A1C: 10.8
Hemoglobin A1C: 10.8

## 2019-10-12 MED ORDER — NITROGLYCERIN 0.4 MG SL SUBL: 0.4000 mg | SUBLINGUAL_TABLET | SUBLINGUAL | 0 refills | Status: DC | PRN

## 2019-10-12 NOTE — Telephone Encounter (Signed)
ATC patient X3 to get him scheduled with Dr.Young or APP for CPAP follow up LMTCB. If patient calls back please schedule for an appointment

## 2019-10-27 DIAGNOSIS — J45998 Other asthma: Secondary | ICD-10-CM | POA: Diagnosis not present

## 2019-10-30 ENCOUNTER — Other Ambulatory Visit: Payer: Self-pay | Admitting: Cardiology

## 2019-10-30 DIAGNOSIS — I10 Essential (primary) hypertension: Secondary | ICD-10-CM

## 2019-10-31 ENCOUNTER — Telehealth (INDEPENDENT_AMBULATORY_CARE_PROVIDER_SITE_OTHER): Payer: Medicare Other | Admitting: Family Medicine

## 2019-10-31 ENCOUNTER — Telehealth: Payer: Self-pay

## 2019-10-31 VITALS — Temp 96.0°F | Ht 71.0 in | Wt 298.0 lb

## 2019-10-31 DIAGNOSIS — Z794 Long term (current) use of insulin: Secondary | ICD-10-CM | POA: Diagnosis not present

## 2019-10-31 DIAGNOSIS — E1169 Type 2 diabetes mellitus with other specified complication: Secondary | ICD-10-CM | POA: Diagnosis not present

## 2019-10-31 DIAGNOSIS — R05 Cough: Secondary | ICD-10-CM | POA: Diagnosis not present

## 2019-10-31 DIAGNOSIS — R059 Cough, unspecified: Secondary | ICD-10-CM | POA: Insufficient documentation

## 2019-10-31 DIAGNOSIS — L304 Erythema intertrigo: Secondary | ICD-10-CM | POA: Insufficient documentation

## 2019-10-31 MED ORDER — HYDROCODONE-HOMATROPINE 5-1.5 MG/5ML PO SYRP: 5.0000 mL | ORAL_SOLUTION | Freq: Three times a day (TID) | ORAL | 0 refills | Status: DC | PRN

## 2019-10-31 MED ORDER — GLUCOSE BLOOD VI STRP: ORAL_STRIP | 12 refills | Status: DC

## 2019-10-31 MED ORDER — NYSTATIN 100000 UNIT/GM EX OINT: 1.0000 "application " | TOPICAL_OINTMENT | Freq: Two times a day (BID) | CUTANEOUS | 0 refills | Status: DC

## 2019-10-31 NOTE — Progress Notes (Signed)
   [name] [name] is a 64 y.o. male who presents today for a virtual office visit.  Assessment/Plan:  Chronic Problems Addressed Today: Intertrigo Nystatin refilled.  Cough Hycodan refilled.  Database with no red flags.  Type 2 diabetes mellitus with other specified complication ([place]), followed by Dr. Buddy Duty Continue basal/bolus regimen per endocrinology.  Will refill test drops today.     Subjective:  HPI:  Patient requests referral on Hycodan cough syrup today.  He has been prescribed this by his pulmonologist in the past for chronic cough.  He has not seen pulmonologist recently due to COVID-19.  Overall he is doing well.  He also requests refill on diabetic test strips and nystatin ointment today.       Objective/Observations  Physical Exam: Gen: NAD, resting comfortably Pulm: Normal work of breathing Neuro: Grossly normal, moves all extremities Psych: Normal affect and thought content  Virtual Visit via Video   I connected with [name] [name] on 10/31/19 at  4:00 PM EDT by a video enabled telemedicine application and verified that I am speaking with the correct person using two identifiers. The limitations of evaluation and management by telemedicine and the availability of in person appointments were discussed. The patient expressed understanding and agreed to proceed.   Patient location: Home Provider location: [place] participating in the virtual visit: Myself and Patient     [name] [name]. [name], MD 10/31/2019 4:21 PM

## 2019-10-31 NOTE — Assessment & Plan Note (Signed)
Hycodan refilled.  Database with no red flags.

## 2019-10-31 NOTE — Assessment & Plan Note (Signed)
Nystatin refilled.

## 2019-10-31 NOTE — Assessment & Plan Note (Signed)
Continue basal/bolus regimen per endocrinology.  Will refill test drops today.

## 2019-10-31 NOTE — Telephone Encounter (Signed)
Pt would like his prescriptions be sent in 90 day supply

## 2019-11-01 ENCOUNTER — Other Ambulatory Visit: Payer: Self-pay | Admitting: *Deleted

## 2019-11-01 MED ORDER — GLUCOSE BLOOD VI STRP: ORAL_STRIP | 12 refills | Status: DC

## 2019-11-01 NOTE — Telephone Encounter (Signed)
Ok to send in new rx - not sure if insurance will pay for 8 times per day.  [name] [name]. [name], MD 11/01/2019 10:09 AM

## 2019-11-01 NOTE — Telephone Encounter (Signed)
Please advise 

## 2019-11-01 NOTE — Telephone Encounter (Signed)
Rx send to pharmacy  

## 2019-11-01 NOTE — Telephone Encounter (Signed)
Pt called stating his test strips were written for 4 times a day but he usually uses them 8 times a day per Dr. Yong Channel for 90 days. Pt is asking for prescription to be changed. Please advise.

## 2019-11-02 ENCOUNTER — Other Ambulatory Visit: Payer: Self-pay | Admitting: Physician Assistant

## 2019-11-02 DIAGNOSIS — I10 Essential (primary) hypertension: Secondary | ICD-10-CM

## 2019-11-06 DIAGNOSIS — J45998 Other asthma: Secondary | ICD-10-CM | POA: Diagnosis not present

## 2019-11-10 ENCOUNTER — Ambulatory Visit (INDEPENDENT_AMBULATORY_CARE_PROVIDER_SITE_OTHER): Payer: Medicare Other

## 2019-11-10 DIAGNOSIS — Z0000 Encounter for general adult medical examination without abnormal findings: Secondary | ICD-10-CM | POA: Diagnosis not present

## 2019-11-10 NOTE — Progress Notes (Signed)
Virtual Visit via Telephone Note  I connected with  [name] [name] on 11/10/19 at  9:30 AM EDT by telephone and verified that I am speaking with the correct person using two identifiers.  Medicare Annual Wellness visit completed telephonically due to Covid-19 pandemic.   Persons participating in this call: This Health Coach and this patient.   Location: Patient: Home Provider: Office   I discussed the limitations, risks, security and privacy concerns of performing an evaluation and management service by telephone and the availability of in person appointments. The patient expressed understanding and agreed to proceed.  Unable to perform video visit due to video visit attempted and failed and/or patient does not have video capability.   Some vital signs may be absent or patient reported.   [name], LPN    Subjective:   [name] [name] is a 64 y.o. male who presents for Medicare Annual/Subsequent preventive examination.  Review of Systems     Cardiac Risk Factors include: hypertension;diabetes mellitus;dyslipidemia;male gender;obesity (BMI >30kg/m2)     Objective:    Today's Vitals   11/10/19 0908  PainSc: 8    There is no height or weight on file to calculate BMI.  Advanced Directives 11/10/2019 09/02/2019 09/01/2019 10/13/2018 04/13/2017 02/25/2015 10/20/2014  Does Patient Have a Medical Advance Directive? No No No No No No No  Would patient like information on creating a medical advance directive? No - Patient declined No - Patient declined - Yes (MAU/Ambulatory/Procedural Areas - Information given) - No - patient declined information -    Current Medications (verified) Outpatient Encounter Medications as of 11/10/2019  Medication Sig   albuterol (PROVENTIL HFA;VENTOLIN HFA) 108 (90 Base) MCG/ACT inhaler Inhale 2 puffs into the lungs every 6 (six) hours as needed for wheezing or shortness of breath.   albuterol (PROVENTIL) (2.5 MG/3ML) 0.083% nebulizer solution Take 3  mLs (2.5 mg total) by nebulization every 6 (six) hours as needed for wheezing or shortness of breath.   aspirin EC 81 MG tablet Take 81 mg by mouth daily.    B-D ULTRAFINE III SHORT PEN 31G X 8 MM MISC    collagenase (SANTYL) ointment Apply 1 application topically daily. Right ankle measures 6.0 x 0.3cm.   diclofenac Sodium (VOLTAREN) 1 % GEL APPLY 2 GRAMS TOPICALLY 4 TIMES DAILY TO KNEE (Patient taking differently: Apply 2 g topically 4 (four) times daily. To knee)   eplerenone (INSPRA) 25 MG tablet TAKE 1 TABLET(25 MG) BY MOUTH DAILY (Patient taking differently: Take 25 mg by mouth daily. )   fluconazole (DIFLUCAN) 150 MG tablet Take 150 mg by mouth once.   glucose blood test strip Use 8 times daily to check blood sugar.   HUMULIN R U-500 KWIKPEN 500 UNIT/ML kwikpen Inject 50-60 Units into the skin See admin instructions. 60 units in the AM, 50 units at lunch and 50 in the evening   HYDROcodone-homatropine (HYCODAN) 5-1.5 MG/5ML syrup Take 5 mLs by mouth every 8 (eight) hours as needed for cough.   Lancets (ACCU-CHEK MULTICLIX) lancets    LANTUS SOLOSTAR 100 UNIT/ML Solostar Pen SMARTSIG:55 Unit(s) Topical Every Morning   losartan (COZAAR) 50 MG tablet Take 0.5 tablets (25 mg total) by mouth daily. Please schedule office visit before any future refills   MAGNESIUM-OXIDE 400 (241.3 Mg) MG tablet Take 1 tablet by mouth 2 (two) times daily.   metoprolol tartrate (LOPRESSOR) 50 MG tablet TAKE 1 TABLET(50 MG) BY MOUTH TWICE DAILY (Patient taking differently: Take 50 mg by mouth  2 (two) times daily. )   nitroGLYCERIN (NITROSTAT) 0.4 MG SL tablet Place 1 tablet (0.4 mg total) under the tongue every 5 (five) minutes as needed for chest pain.   nystatin ointment (MYCOSTATIN) Apply 1 application topically 2 (two) times daily.   polyethylene glycol (MIRALAX / GLYCOLAX) 17 g packet Take 17 g by mouth daily.   potassium chloride SA (KLOR-CON) 20 MEQ tablet TAKE 2 TABLETS(40 MEQ) BY MOUTH  THREE TIMES DAILY   rosuvastatin (CRESTOR) 20 MG tablet Take 1 tablet (20 mg total) by mouth every evening.   silver sulfADIAZINE (SILVADENE) 1 % cream Apply pea-sized amount to wound daily.   torsemide (DEMADEX) 20 MG tablet TAKE 1 TABLET(20 MG) BY MOUTH TWICE DAILY (Patient taking differently: Take 20 mg by mouth 2 (two) times daily. )   fluticasone (FLONASE) 50 MCG/ACT nasal spray Place 1 spray into both nostrils daily. (Patient not taking: Reported on 11/10/2019)   [DISCONTINUED] HUMALOG KWIKPEN 200 UNIT/ML KwikPen Inject into the skin.   [DISCONTINUED] simvastatin (ZOCOR) 80 MG tablet Take 80 mg by mouth daily.     No facility-administered encounter medications on file as of 11/10/2019.    Allergies (verified) Ibuprofen   History: Past Medical History:  Diagnosis Date   Acute myocardial infarction, unspecified site, episode of care unspecified 2005   Acute pancreatitis    CAD (coronary artery disease)    a. CABG in 2005 w LIMA to LAD, left radial to second circumflex marginal, saphenous vein graft to PDA, saphenous vein graft to lateral subbrach of ramus intermediate, and sequential saphenous vein graft to the medial subbranch of ramus intermediate.   Chronic diastolic CHF (congestive heart failure) (HCC)    Cirrhosis of liver without mention of alcohol    CKD (chronic kidney disease), stage II    Complications affecting other specified body systems, hypertension    Ectopic atrial rhythm    Essential hypertension    Hyperlipidemia    Hypokalemia    a. intermitttent noncompliance with potassium supplement.   Hypomagnesemia    Morbid obesity (HCC)    Neuropathy    Other and unspecified hyperlipidemia    Proteinuria    RBBB    Sleep apnea    Type II or unspecified type diabetes mellitus without mention of complication, not stated as uncontrolled    Varicose veins of both lower extremities    Past Surgical History:  Procedure Laterality Date    CORONARY ARTERY BYPASS GRAFT     2005   I & D EXTREMITY Left 09/02/2019   Procedure: IRRIGATION AND DEBRIDEMENT 4th TOE DEBRIDIMENT;  Surgeon: Evelina Bucy, DPM;  Location: [facility];  Service: Podiatry;  Laterality: Left;   Family History  Problem Relation Age of Onset   Breast cancer Mother    Prostate cancer Father    Coronary artery disease Father        underwent CABG   Social History   Socioeconomic History   Marital status: Significant Other    Spouse name: Not on file   Number of children: Not on file   Years of education: Not on file   Highest education level: Not on file  Occupational History   Occupation: Disabled     Comment: previously worked in delivery   Tobacco Use   Smoking status: Former Smoker    Packs/day: 1.50    Years: 30.00    Pack years: 45.00    Quit date: 10/20/2004    Years since quitting: 36.0  Smokeless tobacco: Never Used  Vaping Use   Vaping Use: Never used  Substance and Sexual Activity   Alcohol use: Yes    Comment: occasional   Drug use: No   Sexual activity: Not on file  Other Topics Concern   Not on file  Social History Narrative   Lives with significant other x 30+ years, apartment "friends smoke". Disability. Is disabled secondary to his work-related injury.       Has over 20 grandchildren    Social Determinants of Health   Financial Resource Strain: Low Risk    Difficulty of Paying Living Expenses: Not hard at all  Food Insecurity: No Food Insecurity   Worried About Charity fundraiser in the Last Year: Never true   Arboriculturist in the Last Year: Never true  Transportation Needs: No Transportation Needs   Lack of Transportation (Medical): No   Lack of Transportation (Non-Medical): No  Physical Activity: Sufficiently Active   Days of Exercise per Week: 5 days   Minutes of Exercise per Session: 30 min  Stress: No Stress Concern Present   Feeling of Stress : Not at all  Social Connections:  Socially Isolated   Frequency of Communication with Friends and Family: More than three times a week   Frequency of Social Gatherings with Friends and Family: More than three times a week   Attends Religious Services: Never   Marine scientist or Organizations: No   Attends Music therapist: Never   Marital Status: Never married    Tobacco Counseling Counseling given: Not Answered   Clinical Intake:  Pre-visit preparation completed: Yes  Pain : 0-10 Pain Score: 8  Pain Type: Chronic pain Pain Location: Leg (left calf pain) Pain Orientation: Left Pain Descriptors / Indicators: Shooting, Sore Pain Onset: More than a month ago Pain Frequency: Intermittent Pain Relieving Factors: tyelonol does help  Pain Relieving Factors: tyelonol does help  BMI - recorded: 41.58 Nutritional Status: BMI > 30  Obese Nutritional Risks: None Diabetes: Yes CBG done?: Yes (141stated) CBG resulted in Enter/ Edit results?: No Did pt. bring in CBG monitor from home?: No  How often do you need to have someone help you when you read instructions, pamphlets, or other written materials from your doctor or pharmacy?: 1 - Never  Diabetic?Yes  Interpreter Needed?: No  Information entered by :: Charlott Rakes, LPN   Activities of Daily Living In your present state of health, do you have any difficulty performing the following activities: 11/10/2019 09/02/2019  Hearing? Y N  Comment will follow up with audio -  Vision? N N  Difficulty concentrating or making decisions? N N  Walking or climbing stairs? Y Y  Dressing or bathing? N N  Doing errands, shopping? N N  Preparing Food and eating ? N -  Using the Toilet? N -  In the past six months, have you accidently leaked urine? N -  Do you have problems with loss of bowel control? N -  Managing your Medications? N -  Managing your Finances? N -  Housekeeping or managing your Housekeeping? N -  Some recent data might be  hidden    Patient Care Team: [name], MD as PCP - General (Family Medicine) [name], MD as PCP - Cardiology (Cardiology) Delrae Rend, MD as Consulting Physician (Endocrinology) Gardiner Barefoot, DPM as Consulting Physician (Podiatry) Deneise Lever, MD as Consulting Physician (Pulmonary Disease) [name], [name], NP (Inactive) as Nurse  Practitioner (Vascular Surgery) [facility] as Consulting Physician  Indicate any recent Medical Services you may have received from other than [facility] providers in the past year (date may be approximate).     Assessment:   This is a routine wellness examination for [name] [name].  Hearing/Vision screen  Hearing Screening   125Hz  250Hz  500Hz  1000Hz  2000Hz  3000Hz  4000Hz  6000Hz  8000Hz   Right ear:           Left ear:           Comments: Difficulty hearing at times will follow up with audiologist  Vision Screening Comments: Pt follows up with Dr Katy Fitch annually for eye exams  Dietary issues and exercise activities discussed: Current Exercise Habits: Home exercise routine, Type of exercise: walking, Time (Minutes): 30, Frequency (Times/Week): 5, Weekly Exercise (Minutes/Week): 150  Goals     DIET - DECREASE SODA OR JUICE INTAKE     Patient drinks a lot of coke and is going to start working towards drinking more water      DIET - REDUCE SODIUM INTAKE     Patient is going to work on changing eating habits to fewer fried foods and sodium filled meats      Patient Stated     Lose weight      Depression Screen PHQ 2/9 Scores 11/10/2019 04/04/2019 10/05/2018 03/24/2016  PHQ - 2 Score 0 0 0 0  PHQ- 9 Score - - 0 -    Fall Risk Fall Risk  11/10/2019 04/04/2019 10/13/2018  Falls in the past year? 1 1 0  Number falls in past yr: 1 0 0  Injury with Fall? 0 1 0  Comment - left knee brusied -  Risk for fall due to : History of fall(s);Impaired balance/gait;Impaired mobility;Impaired vision - Impaired balance/gait  Follow up  Falls prevention discussed - Education provided    Any stairs in or around the home? Yes  If so, are there any without handrails? No  Home free of loose throw rugs in walkways, pet beds, electrical cords, etc? Yes  Adequate lighting in your home to reduce risk of falls? Yes   ASSISTIVE DEVICES UTILIZED TO PREVENT FALLS:  Life alert? No  Use of a cane, walker or w/c? Yes  Grab bars in the bathroom? Yes  Shower chair or bench in shower? Yes  Elevated toilet seat or a handicapped toilet? Yes   TIMED UP AND GO:  Was the test performed? No .      Cognitive Function:     6CIT Screen 11/10/2019  What Year? 0 points  What month? 0 points  Count back from 20 0 points  Months in reverse 0 points  Repeat phrase 2 points    Immunizations Immunization History  Administered Date(s) Administered   Influenza Split 11/11/2010, 10/23/2011, 11/10/2012   Influenza,inj,Quad PF,6+ Mos 11/10/2013, 12/10/2016   Influenza-Unspecified 11/30/2015   Pneumococcal Polysaccharide-23 06/26/2008   Tdap 09/01/2019    TDAP status: Up to date Flu Vaccine status: Declined, Education has been provided regarding the importance of this vaccine but patient still declined. Advised may receive this vaccine at local pharmacy or Health Dept. Aware to provide a copy of the vaccination record if obtained from local pharmacy or Health Dept. Verbalized acceptance and understanding. Pneumococcal vaccine status: Declined,  Education has been provided regarding the importance of this vaccine but patient still declined. Advised may receive this vaccine at local pharmacy or Health Dept. Aware to provide a copy of the vaccination record if  obtained from local pharmacy or Health Dept. Verbalized acceptance and understanding.  Covid-19 vaccine status: Completed vaccines  Qualifies for Shingles Vaccine? Yes   Zostavax completed No   Shingrix Completed?: No.    Education has been provided regarding the importance of  this vaccine. Patient has been advised to call insurance company to determine out of pocket expense if they have not yet received this vaccine. Advised may also receive vaccine at local pharmacy or Health Dept. Verbalized acceptance and understanding.  Screening Tests Health Maintenance  Topic Date Due   COVID-19 Vaccine (1) Never done   OPHTHALMOLOGY EXAM  01/01/2019   FOOT EXAM  03/17/2019   INFLUENZA VACCINE  09/11/2019   COLONOSCOPY  04/03/2020 (Originally 01/17/2004)   PNA vac Low Risk Adult (1 of 2 - PCV13) 04/03/2020 (Originally 01/17/2019)   HEMOGLOBIN A1C  03/05/2020   TETANUS/TDAP  08/31/2029   Hepatitis C Screening  Completed   HIV Screening  Completed    Health Maintenance  Health Maintenance Due  Topic Date Due   COVID-19 Vaccine (1) Never done   OPHTHALMOLOGY EXAM  01/01/2019   FOOT EXAM  03/17/2019   INFLUENZA VACCINE  09/11/2019    Colorectal cancer screening postponed 04/03/20   Additional Screening:  Hepatitis C Screening: Completed 12/05/10  Vision Screening: Recommended annual ophthalmology exams for early detection of glaucoma and other disorders of the eye. Is the patient up to date with their annual eye exam?  Yes  Who is the provider or what is the name of the office in which the patient attends annual eye exams? Dr Katy Fitch    Dental Screening: Recommended annual dental exams for proper oral hygiene  Community Resource Referral / Chronic Care Management: CRR required this visit?  No   CCM required this visit?  No      Plan:     I have personally reviewed and noted the following in the patients chart:    Medical and social history  Use of alcohol, tobacco or illicit drugs   Current medications and supplements  Functional ability and status  Nutritional status  Physical activity  Advanced directives  List of other physicians  Hospitalizations, surgeries, and ER visits in previous 12 months  Vitals  Screenings to  include cognitive, depression, and falls  Referrals and appointments  In addition, I have reviewed and discussed with patient certain preventive protocols, quality metrics, and best practice recommendations. A written personalized care plan for preventive services as well as general preventive health recommendations were provided to patient.     [name], LPN   [id number]   Nurse Notes: None

## 2019-11-10 NOTE — Patient Instructions (Addendum)
[name] [name] , Thank you for taking time to come for your Medicare Wellness Visit. I appreciate your ongoing commitment to your health goals. Please review the following plan we discussed and let me know if I can assist you in the future.   Screening recommendations/referrals: Colonoscopy: Postponed 04/03/20 Recommended yearly ophthalmology/optometry visit for glaucoma screening and checkup Recommended yearly dental visit for hygiene and checkup  Vaccinations: Influenza vaccine: Done 12/10/16 Due and discussed Pneumococcal vaccine: Due and discussed Tdap vaccine: Done 09/01/19 Shingles vaccine: Shingrix discussed. Please contact your pharmacy for coverage information.    Covid-19: Pt stated completed and will call back with dates  Advanced directives: Advance directive discussed with you today. Even though you declined this today please call our office should you change your mind and we can give you the proper paperwork for you to fill out.  Conditions/risks identified: lose weight  Next appointment: Follow up in one year for your annual wellness visit.   Preventive Care 13 Years and Older, Male Preventive care refers to lifestyle choices and visits with your health care provider that can promote health and wellness. What does preventive care include?  A yearly physical exam. This is also called an annual well check.  Dental exams once or twice a year.  Routine eye exams. Ask your health care provider how often you should have your eyes checked.  Personal lifestyle choices, including:  Daily care of your teeth and gums.  Regular physical activity.  Eating a healthy diet.  Avoiding tobacco and drug use.  Limiting alcohol use.  Practicing safe sex.  Taking low doses of aspirin every day.  Taking vitamin and mineral supplements as recommended by your health care provider. What happens during an annual well check? The services and screenings done by your health care provider  during your annual well check will depend on your age, overall health, lifestyle risk factors, and family history of disease. Counseling  Your health care provider may ask you questions about your:  Alcohol use.  Tobacco use.  Drug use.  Emotional well-being.  Home and relationship well-being.  Sexual activity.  Eating habits.  History of falls.  Memory and ability to understand (cognition).  Work and work Statistician. Screening  You may have the following tests or measurements:  Height, weight, and BMI.  Blood pressure.  Lipid and cholesterol levels. These may be checked every 5 years, or more frequently if you are over 71 years old.  Skin check.  Lung cancer screening. You may have this screening every year starting at age 59 if you have a 30-pack-year history of smoking and currently smoke or have quit within the past 15 years.  Fecal occult blood test (FOBT) of the stool. You may have this test every year starting at age 50.  Flexible sigmoidoscopy or colonoscopy. You may have a sigmoidoscopy every 5 years or a colonoscopy every 10 years starting at age 73.  Prostate cancer screening. Recommendations will vary depending on your family history and other risks.  Hepatitis C blood test.  Hepatitis B blood test.  Sexually transmitted disease (STD) testing.  Diabetes screening. This is done by checking your blood sugar (glucose) after you have not eaten for a while (fasting). You may have this done every 1-3 years.  Abdominal aortic aneurysm (AAA) screening. You may need this if you are a current or former smoker.  Osteoporosis. You may be screened starting at age 51 if you are at high risk. Talk with your health care  provider about your test results, treatment options, and if necessary, the need for more tests. Vaccines  Your health care provider may recommend certain vaccines, such as:  Influenza vaccine. This is recommended every year.  Tetanus,  diphtheria, and acellular pertussis (Tdap, Td) vaccine. You may need a Td booster every 10 years.  Zoster vaccine. You may need this after age 22.  Pneumococcal 13-valent conjugate (PCV13) vaccine. One dose is recommended after age 18.  Pneumococcal polysaccharide (PPSV23) vaccine. One dose is recommended after age 72. Talk to your health care provider about which screenings and vaccines you need and how often you need them. This information is not intended to replace advice given to you by your health care provider. Make sure you discuss any questions you have with your health care provider. Document Released: 02/23/2015 Document Revised: 10/17/2015 Document Reviewed: 11/28/2014 Elsevier Interactive Patient Education  2017 [place] Prevention in the Home Falls can cause injuries. They can happen to people of all ages. There are many things you can do to make your home safe and to help prevent falls. What can I do on the outside of my home?  Regularly fix the edges of walkways and driveways and fix any cracks.  Remove anything that might make you trip as you walk through a door, such as a raised step or threshold.  Trim any bushes or trees on the path to your home.  Use bright outdoor lighting.  Clear any walking paths of anything that might make someone trip, such as rocks or tools.  Regularly check to see if handrails are loose or broken. Make sure that both sides of any steps have handrails.  Any raised decks and porches should have guardrails on the edges.  Have any leaves, snow, or ice cleared regularly.  Use sand or salt on walking paths during winter.  Clean up any spills in your garage right away. This includes oil or grease spills. What can I do in the bathroom?  Use night lights.  Install grab bars by the toilet and in the tub and shower. Do not use towel bars as grab bars.  Use non-skid mats or decals in the tub or shower.  If you need to sit down in  the shower, use a plastic, non-slip stool.  Keep the floor dry. Clean up any water that spills on the floor as soon as it happens.  Remove soap buildup in the tub or shower regularly.  Attach bath mats securely with double-sided non-slip rug tape.  Do not have throw rugs and other things on the floor that can make you trip. What can I do in the bedroom?  Use night lights.  Make sure that you have a light by your bed that is easy to reach.  Do not use any sheets or blankets that are too big for your bed. They should not hang down onto the floor.  Have a firm chair that has side arms. You can use this for support while you get dressed.  Do not have throw rugs and other things on the floor that can make you trip. What can I do in the kitchen?  Clean up any spills right away.  Avoid walking on wet floors.  Keep items that you use a lot in easy-to-reach places.  If you need to reach something above you, use a strong step stool that has a grab bar.  Keep electrical cords out of the way.  Do not use floor polish  or wax that makes floors slippery. If you must use wax, use non-skid floor wax.  Do not have throw rugs and other things on the floor that can make you trip. What can I do with my stairs?  Do not leave any items on the stairs.  Make sure that there are handrails on both sides of the stairs and use them. Fix handrails that are broken or loose. Make sure that handrails are as long as the stairways.  Check any carpeting to make sure that it is firmly attached to the stairs. Fix any carpet that is loose or worn.  Avoid having throw rugs at the top or bottom of the stairs. If you do have throw rugs, attach them to the floor with carpet tape.  Make sure that you have a light switch at the top of the stairs and the bottom of the stairs. If you do not have them, ask someone to add them for you. What else can I do to help prevent falls?  Wear shoes that:  Do not have high  heels.  Have rubber bottoms.  Are comfortable and fit you well.  Are closed at the toe. Do not wear sandals.  If you use a stepladder:  Make sure that it is fully opened. Do not climb a closed stepladder.  Make sure that both sides of the stepladder are locked into place.  Ask someone to hold it for you, if possible.  Clearly mark and make sure that you can see:  Any grab bars or handrails.  First and last steps.  Where the edge of each step is.  Use tools that help you move around (mobility aids) if they are needed. These include:  Canes.  Walkers.  Scooters.  Crutches.  Turn on the lights when you go into a dark area. Replace any light bulbs as soon as they burn out.  Set up your furniture so you have a clear path. Avoid moving your furniture around.  If any of your floors are uneven, fix them.  If there are any pets around you, be aware of where they are.  Review your medicines with your doctor. Some medicines can make you feel dizzy. This can increase your chance of falling. Ask your doctor what other things that you can do to help prevent falls. This information is not intended to replace advice given to you by your health care provider. Make sure you discuss any questions you have with your health care provider. Document Released: 11/23/2008 Document Revised: 07/05/2015 Document Reviewed: 03/03/2014 Elsevier Interactive Patient Education  2017 [employer].

## 2019-11-11 ENCOUNTER — Ambulatory Visit: Payer: Medicare Other | Admitting: Podiatry

## 2019-11-11 DIAGNOSIS — J449 Chronic obstructive pulmonary disease, unspecified: Secondary | ICD-10-CM | POA: Diagnosis not present

## 2019-11-22 ENCOUNTER — Other Ambulatory Visit: Payer: Self-pay

## 2019-11-22 ENCOUNTER — Ambulatory Visit (INDEPENDENT_AMBULATORY_CARE_PROVIDER_SITE_OTHER): Payer: Medicare Other | Admitting: Podiatry

## 2019-11-22 DIAGNOSIS — E1169 Type 2 diabetes mellitus with other specified complication: Secondary | ICD-10-CM | POA: Diagnosis not present

## 2019-11-22 DIAGNOSIS — E1142 Type 2 diabetes mellitus with diabetic polyneuropathy: Secondary | ICD-10-CM | POA: Diagnosis not present

## 2019-11-22 DIAGNOSIS — B351 Tinea unguium: Secondary | ICD-10-CM

## 2019-11-22 NOTE — Progress Notes (Signed)
  Subjective:  Patient ID: [name] [name], male    DOB: February 08, 1956,  MRN: [id number]  Chief Complaint  Patient presents with  . Toe Pain    Left 4th toe occasional shooting pain into dorsal foot  . Diabetic Ulcer    Pt states ulcer is healed on left 4th toe. Denies fever/nausea/vomiting/chills.    DOS: 09/02/19 Procedure: Left fourth toe open fx debridement   64 y.o. male presents with the above complaint. History confirmed with patient. Requests care of his nails today.  Objective:  Physical Exam: tenderness at the surgical site, no edema noted and calf supple, nontender. Incision: fully healed Onychomycosis x10,, Protective sensation absent   Assessment:   1. Onychomycosis of multiple toenails with type 2 diabetes mellitus and peripheral neuropathy ([place])     Plan:  Patient was evaluated and treated and all questions answered.  Post-operative State -Well healed  Diabetes with DPN, Onychomycosis -Educated on diabetic footcare. Diabetic risk level 1 -Nails x10 debrided sharply and manually with large nail nipper and rotary burr.    Procedure: Nail Debridement Rationale: Patient meets criteria for routine foot care due to DPN Type of Debridement: manual, sharp debridement. Instrumentation: Nail nipper, rotary burr. Number of Nails: 10     No follow-ups on file.

## 2019-11-30 DIAGNOSIS — Z8719 Personal history of other diseases of the digestive system: Secondary | ICD-10-CM | POA: Diagnosis not present

## 2019-11-30 DIAGNOSIS — E1165 Type 2 diabetes mellitus with hyperglycemia: Secondary | ICD-10-CM | POA: Diagnosis not present

## 2019-11-30 DIAGNOSIS — Z794 Long term (current) use of insulin: Secondary | ICD-10-CM | POA: Diagnosis not present

## 2019-11-30 DIAGNOSIS — E1142 Type 2 diabetes mellitus with diabetic polyneuropathy: Secondary | ICD-10-CM | POA: Diagnosis not present

## 2019-12-02 DIAGNOSIS — J45998 Other asthma: Secondary | ICD-10-CM | POA: Diagnosis not present

## 2019-12-06 DIAGNOSIS — J45998 Other asthma: Secondary | ICD-10-CM | POA: Diagnosis not present

## 2019-12-12 ENCOUNTER — Other Ambulatory Visit: Payer: Self-pay

## 2019-12-12 ENCOUNTER — Encounter: Payer: Self-pay | Admitting: Cardiology

## 2019-12-12 ENCOUNTER — Ambulatory Visit (INDEPENDENT_AMBULATORY_CARE_PROVIDER_SITE_OTHER): Payer: Medicare Other | Admitting: Cardiology

## 2019-12-12 VITALS — BP 118/64 | HR 89 | Ht 71.0 in | Wt 309.6 lb

## 2019-12-12 DIAGNOSIS — Z79899 Other long term (current) drug therapy: Secondary | ICD-10-CM

## 2019-12-12 DIAGNOSIS — I25708 Atherosclerosis of coronary artery bypass graft(s), unspecified, with other forms of angina pectoris: Secondary | ICD-10-CM

## 2019-12-12 DIAGNOSIS — I10 Essential (primary) hypertension: Secondary | ICD-10-CM

## 2019-12-12 DIAGNOSIS — I2510 Atherosclerotic heart disease of native coronary artery without angina pectoris: Secondary | ICD-10-CM | POA: Diagnosis not present

## 2019-12-12 DIAGNOSIS — I491 Atrial premature depolarization: Secondary | ICD-10-CM

## 2019-12-12 DIAGNOSIS — I5032 Chronic diastolic (congestive) heart failure: Secondary | ICD-10-CM

## 2019-12-12 DIAGNOSIS — J449 Chronic obstructive pulmonary disease, unspecified: Secondary | ICD-10-CM | POA: Diagnosis not present

## 2019-12-12 LAB — LIPID PANEL
Chol/HDL Ratio: 2.7 ratio (ref 0.0–5.0)
Cholesterol, Total: 126 mg/dL (ref 100–199)
HDL: 47 mg/dL (ref >39)
LDL Chol Calc (NIH): 56 mg/dL (ref 0–99)
Triglycerides: 132 mg/dL (ref 0–149)
VLDL Cholesterol Cal: 23 mg/dL (ref 5–40)

## 2019-12-12 LAB — BASIC METABOLIC PANEL
BUN/Creatinine Ratio: 12 (ref 10–24)
BUN: 16 mg/dL (ref 8–27)
CO2: 25 mmol/L (ref 20–29)
Calcium: 9.4 mg/dL (ref 8.6–10.2)
Chloride: 98 mmol/L (ref 96–106)
Creatinine, Ser: 1.32 mg/dL — ABNORMAL HIGH (ref 0.76–1.27)
GFR calc Af Amer: 65 mL/min/{1.73_m2} (ref >59)
GFR calc non Af Amer: 56 mL/min/{1.73_m2} — ABNORMAL LOW (ref >59)
Glucose: 234 mg/dL — ABNORMAL HIGH (ref 65–99)
Potassium: 3.6 mmol/L (ref 3.5–5.2)
Sodium: 138 mmol/L (ref 134–144)

## 2019-12-12 LAB — MAGNESIUM: Magnesium: 1.6 mg/dL (ref 1.6–2.3)

## 2019-12-12 MED ORDER — NITROGLYCERIN 0.4 MG SL SUBL: 0.4000 mg | SUBLINGUAL_TABLET | SUBLINGUAL | 4 refills | Status: DC | PRN

## 2019-12-12 MED ORDER — FAMOTIDINE 20 MG PO TABS: 20.0000 mg | ORAL_TABLET | Freq: Every day | ORAL | 3 refills | Status: DC

## 2019-12-12 NOTE — Patient Instructions (Addendum)
Medication Instructions:  The current medical regimen is effective;  continue present plan and medications.  Lab: Please have blood work today (BMP, MG, Lipid)  Follow-Up: At [facility], you and your health needs are our priority.  As part of our continuing mission to provide you with exceptional heart care, we have created designated Provider Care Teams.  These Care Teams include your primary Cardiologist (physician) and Advanced Practice Providers (APPs -  Physician Assistants and Nurse Practitioners) who all work together to provide you with the care you need, when you need it.  We recommend signing up for the patient portal called "MyChart".  Sign up information is provided on this After Visit Summary.  MyChart is used to connect with patients for Virtual Visits (Telemedicine).  Patients are able to view lab/test results, encounter notes, upcoming appointments, etc.  Non-urgent messages can be sent to your provider as well.   To learn more about what you can do with MyChart, go to [email or web address].    Your next appointment:   6 month(s)  The format for your next appointment:   In Person  Provider:   Melina Copa, PA-C   Thank you for choosing [facility]!!

## 2019-12-12 NOTE — Progress Notes (Signed)
Cardiology Office Note:    Date:  12/12/2019   ID:  [name] [name], DOB 1955/10/15, MRN [id number]  PCP:  [name], MD  [facility] Cardiologist:  Candee Furbish, MD  [facility] Electrophysiologist:  None   Referring MD: [name], MD     History of Present Illness:    [name] [name] is a 64 y.o. male here for follow-up of coronary artery disease status post CABG in 2005.  Overall doing quite well minor shortness of breath with activity.  NYHA class II.  Has had prior ectopic atrial rhythm.  Dr. Curt Bears has reviewed in the past.  On metoprolol.  EF low normal.  Also has right bundle branch block obstructive sleep apnea.  Dr. Annamaria Boots.  He has been battling with a little bit of indigestion recently.  Past Medical History:  Diagnosis Date  . Acute myocardial infarction, unspecified site, episode of care unspecified 2005  . Acute pancreatitis   . CAD (coronary artery disease)    a. CABG in 2005 w LIMA to LAD, left radial to second circumflex marginal, saphenous vein graft to PDA, saphenous vein graft to lateral subbrach of ramus intermediate, and sequential saphenous vein graft to the medial subbranch of ramus intermediate.  . Chronic diastolic CHF (congestive heart failure) ([place])   . Cirrhosis of liver without mention of alcohol   . CKD (chronic kidney disease), stage II   . Complications affecting other specified body systems, hypertension   . Ectopic atrial rhythm   . Essential hypertension   . Hyperlipidemia   . Hypokalemia    a. intermitttent noncompliance with potassium supplement.  . Hypomagnesemia   . Morbid obesity ([place])   . Neuropathy   . Other and unspecified hyperlipidemia   . Proteinuria   . RBBB   . Sleep apnea   . Type II or unspecified type diabetes mellitus without mention of complication, not stated as uncontrolled   . Varicose veins of both lower extremities     Past Surgical History:  Procedure Laterality Date  . CORONARY ARTERY BYPASS  GRAFT     2005  . I & D EXTREMITY Left 09/02/2019   Procedure: IRRIGATION AND DEBRIDEMENT 4th TOE DEBRIDIMENT;  Surgeon: Evelina Bucy, DPM;  Location: [facility] ORS;  Service: Podiatry;  Laterality: Left;    Current Medications: Current Meds  Medication Sig  . albuterol (PROVENTIL HFA;VENTOLIN HFA) 108 (90 Base) MCG/ACT inhaler Inhale 2 puffs into the lungs every 6 (six) hours as needed for wheezing or shortness of breath.  Marland Kitchen albuterol (PROVENTIL) (2.5 MG/3ML) 0.083% nebulizer solution Take 3 mLs (2.5 mg total) by nebulization every 6 (six) hours as needed for wheezing or shortness of breath.  Marland Kitchen aspirin EC 81 MG tablet Take 81 mg by mouth daily.   . B-D ULTRAFINE III SHORT PEN 31G X 8 MM MISC   . collagenase (SANTYL) ointment Apply 1 application topically daily. Right ankle measures 6.0 x 0.3cm.  Marland Kitchen diclofenac Sodium (VOLTAREN) 1 % GEL APPLY 2 GRAMS TOPICALLY 4 TIMES DAILY TO KNEE  . eplerenone (INSPRA) 25 MG tablet TAKE 1 TABLET(25 MG) BY MOUTH DAILY  . fluticasone (FLONASE) 50 MCG/ACT nasal spray Place 1 spray into both nostrils daily.  Marland Kitchen glucose blood test strip Use 8 times daily to check blood sugar.  Marland Kitchen HUMULIN R U-500 KWIKPEN 500 UNIT/ML kwikpen Inject 50-60 Units into the skin See admin instructions. 60 units in the AM, 50 units at lunch and 50 in the evening  .  HYDROcodone-homatropine (HYCODAN) 5-1.5 MG/5ML syrup Take 5 mLs by mouth every 8 (eight) hours as needed for cough.  . Lancets (ACCU-CHEK MULTICLIX) lancets   . LANTUS SOLOSTAR 100 UNIT/ML Solostar Pen SMARTSIG:55 Unit(s) Topical Every Morning  . losartan (COZAAR) 50 MG tablet Take 0.5 tablets (25 mg total) by mouth daily. Please schedule office visit before any future refills  . MAGNESIUM-OXIDE 400 (241.3 Mg) MG tablet Take 1 tablet by mouth 2 (two) times daily.  . metoprolol tartrate (LOPRESSOR) 50 MG tablet TAKE 1 TABLET(50 MG) BY MOUTH TWICE DAILY  . nitroGLYCERIN (NITROSTAT) 0.4 MG SL tablet Place 1 tablet (0.4 mg total) under  the tongue every 5 (five) minutes as needed for chest pain.  Marland Kitchen nystatin ointment (MYCOSTATIN) Apply 1 application topically 2 (two) times daily.  . polyethylene glycol (MIRALAX / GLYCOLAX) 17 g packet Take 17 g by mouth daily.  . potassium chloride SA (KLOR-CON) 20 MEQ tablet TAKE 2 TABLETS(40 MEQ) BY MOUTH THREE TIMES DAILY  . rosuvastatin (CRESTOR) 20 MG tablet Take 1 tablet (20 mg total) by mouth every evening.  . silver sulfADIAZINE (SILVADENE) 1 % cream Apply pea-sized amount to wound daily.  Marland Kitchen torsemide (DEMADEX) 20 MG tablet TAKE 1 TABLET(20 MG) BY MOUTH TWICE DAILY  . [DISCONTINUED] nitroGLYCERIN (NITROSTAT) 0.4 MG SL tablet Place 1 tablet (0.4 mg total) under the tongue every 5 (five) minutes as needed for chest pain.     Allergies:   Ibuprofen   Social History   Socioeconomic History  . Marital status: Significant Other    Spouse name: Not on file  . Number of children: Not on file  . Years of education: Not on file  . Highest education level: Not on file  Occupational History  . Occupation: Disabled     Comment: previously worked in delivery   Tobacco Use  . Smoking status: Former Smoker    Packs/day: 1.50    Years: 30.00    Pack years: 45.00    Quit date: 10/20/2004    Years since quitting: 15.1  . Smokeless tobacco: Never Used  Vaping Use  . Vaping Use: Never used  Substance and Sexual Activity  . Alcohol use: Yes    Comment: occasional  . Drug use: No  . Sexual activity: Not on file  Other Topics Concern  . Not on file  Social History Narrative   Lives with significant other x 30+ years, apartment "friends smoke". Disability. Is disabled secondary to his work-related injury.       Has over 20 grandchildren    Social Determinants of Health   Financial Resource Strain: Low Risk   . Difficulty of Paying Living Expenses: Not hard at all  Food Insecurity: No Food Insecurity  . Worried About Charity fundraiser in the Last Year: Never true  . Ran Out of Food  in the Last Year: Never true  Transportation Needs: No Transportation Needs  . Lack of Transportation (Medical): No  . Lack of Transportation (Non-Medical): No  Physical Activity: Sufficiently Active  . Days of Exercise per Week: 5 days  . Minutes of Exercise per Session: 30 min  Stress: No Stress Concern Present  . Feeling of Stress : Not at all  Social Connections: Socially Isolated  . Frequency of Communication with Friends and Family: More than three times a week  . Frequency of Social Gatherings with Friends and Family: More than three times a week  . Attends Religious Services: Never  . Active Member of  Clubs or Organizations: No  . Attends Archivist Meetings: Never  . Marital Status: Never married     Family History: The patient's family history includes Breast cancer in his mother; Coronary artery disease in his father; Prostate cancer in his father.  ROS:   Please see the history of present illness.    No fevers chills nausea vomiting.  All other systems reviewed and are negative.  EKGs/Labs/Other Studies Reviewed:    ECHO 2019: - Left ventricle: The cavity size was normal. Wall thickness was  normal. Systolic function was normal. The estimated ejection  fraction was in the range of 50% to 55%. Left ventricular  diastolic function parameters were normal.  - Mitral valve: There was mild regurgitation.  - Right ventricle: The cavity size was mildly dilated.  - Right atrium: The atrium was moderately dilated.   Impressions:   - Poor acoustic windows limit study even with use of Definity.  Recent Labs: 06/03/2019: NT-Pro BNP 367 09/02/2019: ALT 13 09/03/2019: BUN 14; Creatinine, Ser 1.23; Hemoglobin 14.6; Magnesium 2.2; Platelets 189; Potassium 3.9; Sodium 137  Recent Lipid Panel    Component Value Date/Time   CHOL 135 10/07/2018 1447   TRIG 232.0 (H) 10/07/2018 1447   HDL 45.30 10/07/2018 1447   CHOLHDL 3 10/07/2018 1447   VLDL 46.4 (H)  10/07/2018 1447   LDLCALC 80 01/08/2010 0837   LDLDIRECT 71.0 10/07/2018 1447    Physical Exam:    VS:  BP 118/64   Pulse 89   Ht 5\' 11"  (1.803 m)   Wt (!) 309 lb 9.6 oz (140.4 kg)   SpO2 96%   BMI 43.18 kg/m     Wt Readings from Last 3 Encounters:  12/12/19 (!) 309 lb 9.6 oz (140.4 kg)  10/31/19 298 lb (135.2 kg)  09/02/19 (!) 302 lb 0.5 oz (137 kg)     GEN:  Well nourished, well developed in no acute distress HEENT: Normal NECK: No JVD; No carotid bruits LYMPHATICS: No lymphadenopathy CARDIAC: RRR, no murmurs, rubs, gallops RESPIRATORY:  Clear to auscultation without rales, wheezing or rhonchi  ABDOMEN: Soft, non-tender, non-distended MUSCULOSKELETAL: Hardened chronic lower extremity edema; No deformity  SKIN: Warm and dry NEUROLOGIC:  Alert and oriented x 3 PSYCHIATRIC:  Normal affect   ASSESSMENT:    1. Coronary artery disease of bypass graft of native heart with stable angina pectoris ([place])   2. Medication management   3. Essential hypertension   4. Chronic diastolic CHF (congestive heart failure) ([place])   5. CAD in native artery   6. Ectopic atrial rhythm    PLAN:    In order of problems listed above:  Coronary artery disease -Post CABG no anginal symptoms  Chronic diastolic heart failure -Continue with torsemide compression hose.  Weight loss. -Continue with current measures, eplerenone.  It is good to keep amyloid in the back of our mind however would hold off on work-up at this time.  Morbid obesity -Continue work on weight loss.  Ectopic atrial rhythm -On metoprolol.  Asymptomatic.  Hypokalemia -Has been on eplerenone and doing well.  Losartan 50 mg as well.  Potassium 3.9 at last check.  Creatinine 1.23.  I will check a basic metabolic profile to look at his potassium as well as magnesium.  Diabetes with hypertension -Continue to work on this hemoglobin A1c in the past 10.8.  Hyperlipidemia -On Crestor 20 mg.  LDL 71 last year, we will  recheck lipid panel  Indigestion -He can try some  Pepcid over-the-counter see if this helps.  We will send a prescription out.  Mylanta has helped in the past.   Medication Adjustments/Labs and Tests Ordered: Current medicines are reviewed at length with the patient today.  Concerns regarding medicines are outlined above.  Orders Placed This Encounter  Procedures  . Basic metabolic panel  . Magnesium  . Lipid panel   Meds ordered this encounter  Medications  . nitroGLYCERIN (NITROSTAT) 0.4 MG SL tablet    Sig: Place 1 tablet (0.4 mg total) under the tongue every 5 (five) minutes as needed for chest pain.    Dispense:  25 tablet    Refill:  4  . famotidine (PEPCID) 20 MG tablet    Sig: Take 1 tablet (20 mg total) by mouth daily.    Dispense:  90 tablet    Refill:  3    Patient Instructions  Medication Instructions:  The current medical regimen is effective;  continue present plan and medications.  Lab: Please have blood work today (BMP, MG, Lipid)  Follow-Up: At [facility], you and your health needs are our priority.  As part of our continuing mission to provide you with exceptional heart care, we have created designated Provider Care Teams.  These Care Teams include your primary Cardiologist (physician) and Advanced Practice Providers (APPs -  Physician Assistants and Nurse Practitioners) who all work together to provide you with the care you need, when you need it.  We recommend signing up for the patient portal called "MyChart".  Sign up information is provided on this After Visit Summary.  MyChart is used to connect with patients for Virtual Visits (Telemedicine).  Patients are able to view lab/test results, encounter notes, upcoming appointments, etc.  Non-urgent messages can be sent to your provider as well.   To learn more about what you can do with MyChart, go to [email or web address].    Your next appointment:   6 month(s)  The format for your next appointment:    In Person  Provider:   Melina Copa, PA-C   Thank you for choosing [facility]!!        Signed, Candee Furbish, MD  12/12/2019 12:14 PM    [place]

## 2019-12-19 ENCOUNTER — Encounter: Payer: Self-pay | Admitting: Family Medicine

## 2020-01-04 DIAGNOSIS — J45998 Other asthma: Secondary | ICD-10-CM | POA: Diagnosis not present

## 2020-01-06 DIAGNOSIS — J45998 Other asthma: Secondary | ICD-10-CM | POA: Diagnosis not present

## 2020-01-09 ENCOUNTER — Telehealth: Payer: Self-pay

## 2020-01-09 NOTE — Telephone Encounter (Signed)
Error

## 2020-01-11 DIAGNOSIS — J449 Chronic obstructive pulmonary disease, unspecified: Secondary | ICD-10-CM | POA: Diagnosis not present

## 2020-01-12 ENCOUNTER — Telehealth: Payer: Self-pay | Admitting: Internal Medicine

## 2020-01-12 ENCOUNTER — Encounter: Payer: Self-pay | Admitting: Family Medicine

## 2020-01-12 ENCOUNTER — Telehealth (INDEPENDENT_AMBULATORY_CARE_PROVIDER_SITE_OTHER): Payer: Medicare Other | Admitting: Family Medicine

## 2020-01-12 VITALS — BP 108/61 | HR 83 | Temp 97.0°F | Ht 71.0 in | Wt 301.0 lb

## 2020-01-12 DIAGNOSIS — R059 Cough, unspecified: Secondary | ICD-10-CM

## 2020-01-12 DIAGNOSIS — I10 Essential (primary) hypertension: Secondary | ICD-10-CM | POA: Diagnosis not present

## 2020-01-12 DIAGNOSIS — E1169 Type 2 diabetes mellitus with other specified complication: Secondary | ICD-10-CM | POA: Diagnosis not present

## 2020-01-12 DIAGNOSIS — Z794 Long term (current) use of insulin: Secondary | ICD-10-CM

## 2020-01-12 MED ORDER — SILVER SULFADIAZINE 1 % EX CREA
TOPICAL_CREAM | CUTANEOUS | 0 refills | Status: DC
Start: 2020-01-12 — End: 2020-09-06

## 2020-01-12 MED ORDER — HUMULIN R U-500 KWIKPEN 500 UNIT/ML ~~LOC~~ SOPN
50.0000 [IU] | PEN_INJECTOR | SUBCUTANEOUS | 0 refills | Status: DC
Start: 2020-01-12 — End: 2022-04-26

## 2020-01-12 MED ORDER — DICLOFENAC SODIUM 1 % EX GEL
CUTANEOUS | 2 refills | Status: DC
Start: 2020-01-12 — End: 2021-05-27

## 2020-01-12 MED ORDER — HYDROCODONE-HOMATROPINE 5-1.5 MG/5ML PO SYRP: 5.0000 mL | ORAL_SOLUTION | Freq: Three times a day (TID) | ORAL | 0 refills | Status: DC | PRN

## 2020-01-12 NOTE — Assessment & Plan Note (Signed)
Reported blood pressure is at goal.  Continue eplerenone 25 mg daily, losartan 25 mg daily, Toprol tartrate 50 mg twice daily, and torsemide 20 mg twice daily.

## 2020-01-12 NOTE — Telephone Encounter (Signed)
Yes, but super freeze on held spot Fri, Feb 25. I have a 12:00 apt myself that day

## 2020-01-12 NOTE — Progress Notes (Signed)
   [name] [name] is a 64 y.o. male who presents today for a virtual office visit.  Assessment/Plan:  Chronic Problems Addressed Today: HTN (hypertension) Reported blood pressure is at goal.  Continue eplerenone 25 mg daily, losartan 25 mg daily, Toprol tartrate 50 mg twice daily, and torsemide 20 mg twice daily.  Cough Hycodan refilled.  No red flags on database.  Type 2 diabetes mellitus ([place]) Patient recently moved and lost insulin.  Stated pharmacy would not refill without prescription.  Normally follows with endocrinology though will refill emergency refill on Humalin today.     Subjective:  HPI:  See A/p.         Objective/Observations  Physical Exam: Gen: NAD, resting comfortably Pulm: Normal work of breathing Neuro: Grossly normal, moves all extremities Psych: Normal affect and thought content  Virtual Visit via Video   I connected with [name] [name] on 01/12/20 at 11:20 AM EST by a video enabled telemedicine application and verified that I am speaking with the correct person using two identifiers. The limitations of evaluation and management by telemedicine and the availability of in person appointments were discussed. The patient expressed understanding and agreed to proceed.   Patient location: Home Provider location: [place] participating in the virtual visit: Myself and Patient     [name] [name]. [name], MD 01/12/2020 11:45 AM

## 2020-01-12 NOTE — Assessment & Plan Note (Signed)
Hycodan refilled.  No red flags on database.

## 2020-01-12 NOTE — Assessment & Plan Note (Signed)
Patient recently moved and lost insulin.  Stated pharmacy would not refill without prescription.  Normally follows with endocrinology though will refill emergency refill on Humalin today.

## 2020-01-13 NOTE — Telephone Encounter (Signed)
Called and left message on pt vm to call back to schedule f/u with CY - OK to use RNA slot -pr

## 2020-01-17 NOTE — Telephone Encounter (Signed)
Called and left message on pt vm to call back to schedule f/u - 2nd attempt - closed encounter -pr

## 2020-01-18 ENCOUNTER — Telehealth: Payer: Self-pay | Admitting: Internal Medicine

## 2020-01-18 NOTE — Telephone Encounter (Signed)
Converted 01/20/2020 to televisit.  Appointments notes addended.  Confirmed past telephone number of [place], FNP

## 2020-01-18 NOTE — Telephone Encounter (Signed)
01/18/20  Contacted patient over the phone.  He reports that he was around his brother over 10 days ago.  Brother unfortunately tested positive for COVID-19 on 01/17/2020.  Patient reports that he received initial two COVID-19 vaccinations.  Patient remains asymptomatic with no current respiratory concerns.  Patient has an upcoming appointment with Dr. Annamaria Boots on 01/20/2020.  Although patient is asymptomatic with no active respiratory symptoms he is wondering if this appointment can be switched to a virtual/phone visit with Dr. Annamaria Boots.  We will route to Dr. Annamaria Boots.  Please advise if you are okay with the 01/20/2020 office visit being converted to a telephone visit with the patient.  Wyn Quaker, FNP

## 2020-01-18 NOTE — Telephone Encounter (Signed)
Ok televisit 

## 2020-01-19 NOTE — Progress Notes (Signed)
HPI  male former smoker followed for OSA, chronic bronchitis, SAR, CAD/MI/CABG/ CHF, HBP, DM, cirrhosis liver, Continues oxygen 2 L for sleep/ [facility]. PFT 06/18/2011-moderate restriction. Flows are normal for measure volume with FEV1/FVC 0.84 and insignificant response to bronchodilator. DLCO 52%. HST-08/05/2016-AHI 37.5/hour, desaturation to 67%, body weight 294 pounds HST 09/08/18- AHI 32.4/ hr, desaturation to 73%- avg 89%, body weight 298 lbs ----------------------------------------------------------------------------------  08/17/2017- 64 year old male former smoker followed for OSA, chronic bronchitis, SAR, CAD/MI/CABG, dCHF, DM2, hepatic cirrhosis, HST-08/05/2016-AHI 37.5/hour, desaturation to 67%, body weight 294 pounds O2-not using-[facility] -----FOLLOWS FOR: He has not been using CPAP due to non compliance. His DME made him turn his machine in. Neb Atrovent// albuterol, albuterol HFA We had ordered CPAP but he says he got sick, could not use CPAP and DME took it back for noncompliance.  He says he was very sick for a while with CHF, influenza and electrolyte imbalance problems.  No longer using his oxygen during sleep. He is willing to try CPAP again, understanding it would help his heart.  01/20/20-  Virtual Visit via Telephone Note  I connected with [name] [name] on 01/22/20 at 11:30 AM EST by telephone and verified that I am speaking with the correct person using two identifiers.  Location: Patient: H Provider: O   I discussed the limitations, risks, security and privacy concerns of performing an evaluation and management service by telephone and the availability of in person appointments. I also discussed with the patient that there may be a patient responsible charge related to this service. The patient expressed understanding and agreed to proceed.   History of Present Illness: 64 year old male former smoker followed for OSA, chronic bronchitis, SAR, CAD/MI/CABG, dCHF, DM2,  PVD, hepatic cirrhosis, Morbid Obesity,  HST 09/08/18- AHI 32.4/ hr, desaturation to 73%- avg 89%, body weight 298 lbs CPAP auto 5-20/ [facility]  Ordered 11/01/2018 Says he's "sleeping fine"  Multiple medical problems are weighing on him. We discussed purpose and goals of CPAP again. Reviewed download with him. Covid vax- 2 Phizer Flu vax- none   Observations/Objective: CPAP auto 5-20/ [facility]  Ordered 11/01/2018 Download- compliance 0%, AHI 2.1/ hr  Assessment and Plan: OSA- reviewed goals - change to auto 5-15 Chronic bronchitis- no recent acute. Follow Up Instructions:    I discussed the assessment and treatment plan with the patient. The patient was provided an opportunity to ask questions and all were answered. The patient agreed with the plan and demonstrated an understanding of the instructions.   The patient was advised to call back or seek an in-person evaluation if the symptoms worsen or if the condition fails to improve as anticipated.  I provided 15 minutes of non-face-to-face time during this encounter.   Baird Lyons, MD     64 year old male former smoker followed for OSA, chronic bronchitis, SAR, CAD/MI/CABG, dCHF, DM2, PVD, hepatic cirrhosis, Morbid Obesity,  HST 09/08/18- AHI 32.4/ hr, desaturation to 73%- avg 89%, body weight 298 lbs CPAP auto 5-20/ [facility]  Ordered 11/01/2018 Download- compliance 0%, AHI 2.1/ hr Body weight today- Covid vax- Flu vax-  CXR 07/03/19-  IMPRESSION: 1. No active cardiopulmonary disease. 2. Chronic interstitial lung changes.   ROS-see HPI   + = positive Constitutional:   No-   weight loss, night sweats, fevers, chills, + fatigue, lassitude. HEENT:   No-  headaches, difficulty swallowing, tooth/dental problems, sore throat,       No-  sneezing, itching, ear ache, nasal congestion, post nasal drip,  CV:  No-  chest pain, orthopnea, PND, + swelling in lower extremities, anasarca,  dizziness, palpitations Resp: + Shortness of  breath with exertion or at rest.              No-   productive cough,  No non-productive cough,  No- coughing up of blood.              No-   change in color of mucus.  + Occasional wheezing.   Skin: No-   rash or lesions. GI:  No-   heartburn, indigestion, abdominal pain, nausea, vomiting,  GU: . MS:  No-   joint pain or swelling.   Neuro-     nothing unusual Psych:  No- change in mood or affect. No depression or anxiety.  No memory loss.   OBJ- Physical Exam General- Alert, Oriented, Affect-appropriate, Distress- none acute, obese,  Skin- rash-none, lesions- none, excoriation- none. + Increased skin turgor? Edema Lymphadenopathy- none Head- atraumatic            Eyes- Gross vision intact, PERRLA, conjunctivae and secretions clear            Ears- Hearing, canals-normal            Nose- Clear, no-Septal dev, mucus, polyps, erosion, perforation             Throat- Mallampati IV , mucosa clear , drainage- none, tonsils- atrophic, +missing teeth Neck- flexible , trachea midline, no stridor , thyroid nl, carotid no bruit Chest - symmetrical excursion , unlabored           Heart/CV- RRR , no murmur , no gallop  , no rub, nl s1 s2                           - JVD- none , edema- none, stasis changes- none, varices- none           Lung- clear to P&A, wheeze- none, cough- none , dullness-none, rub- none           Chest wall- sternotomy scar Abd-  Br/ Gen/ Rectal- Not done, not indicated Extrem- cyanosis- none, clubbing, none, atrophy- none, strength- nl. Vascular graft donor site scar left forearm. + Cane Neuro- grossly intact to observation

## 2020-01-20 ENCOUNTER — Ambulatory Visit (INDEPENDENT_AMBULATORY_CARE_PROVIDER_SITE_OTHER): Payer: Medicare Other | Admitting: Internal Medicine

## 2020-01-20 ENCOUNTER — Encounter: Payer: Self-pay | Admitting: Internal Medicine

## 2020-01-20 ENCOUNTER — Other Ambulatory Visit: Payer: Self-pay

## 2020-01-20 DIAGNOSIS — I2510 Atherosclerotic heart disease of native coronary artery without angina pectoris: Secondary | ICD-10-CM | POA: Diagnosis not present

## 2020-01-20 DIAGNOSIS — G4733 Obstructive sleep apnea (adult) (pediatric): Secondary | ICD-10-CM

## 2020-01-20 NOTE — Patient Instructions (Signed)
Order- DME [facility]- please change autopap to 5-15, replace supplies, continue mask of choice, humidifier, supplies, Airview/ card  Our goal is to use CPAP all night, every night to protect your heart and your brain. If CPAP is uncomfortable please let us know so we can make adjustments.   Please call if we can help

## 2020-01-22 ENCOUNTER — Encounter: Payer: Self-pay | Admitting: Internal Medicine

## 2020-01-22 NOTE — Assessment & Plan Note (Addendum)
CPAP compliance is important given his cardiac problems. Plan- change auto to 5-15/ [place]. Education done.

## 2020-01-22 NOTE — Assessment & Plan Note (Signed)
Emphasized importance of maintaining f/u with cardiology.

## 2020-01-26 ENCOUNTER — Other Ambulatory Visit: Payer: Self-pay | Admitting: Family Medicine

## 2020-02-02 DIAGNOSIS — Z794 Long term (current) use of insulin: Secondary | ICD-10-CM | POA: Diagnosis not present

## 2020-02-02 DIAGNOSIS — E1165 Type 2 diabetes mellitus with hyperglycemia: Secondary | ICD-10-CM | POA: Diagnosis not present

## 2020-02-02 DIAGNOSIS — Z8719 Personal history of other diseases of the digestive system: Secondary | ICD-10-CM | POA: Diagnosis not present

## 2020-02-02 DIAGNOSIS — E1142 Type 2 diabetes mellitus with diabetic polyneuropathy: Secondary | ICD-10-CM | POA: Diagnosis not present

## 2020-02-02 LAB — HEMOGLOBIN A1C: Hemoglobin A1C: 10.6

## 2020-02-05 DIAGNOSIS — J45998 Other asthma: Secondary | ICD-10-CM | POA: Diagnosis not present

## 2020-02-07 DIAGNOSIS — J45998 Other asthma: Secondary | ICD-10-CM | POA: Diagnosis not present

## 2020-02-08 ENCOUNTER — Telehealth: Payer: Self-pay | Admitting: Cardiology

## 2020-02-08 NOTE — Telephone Encounter (Signed)
Received call from [facility] case manager, Morrie Sheldon reporting 4 lb weight gain over the past two days.  Called the patient who confirms 4 lb weight gain in the past two days. He denies any swelling or SOB at this time. He states that he has been constipated, taking a stool softener. He has also experienced some indigestion, he is taking daily acid reflux medication that has helped. He states after he has a bowel movement, his indigestion is much better.   Encouraged patient to call us back with any new concerns, increasing weight gain, swelling or SOB. He verbalized understanding and thanked me for the call.

## 2020-02-08 NOTE — Telephone Encounter (Signed)
Pt c/o swelling: STAT is pt has developed SOB within 24 hours  1) How much weight have you gained and in what time span? 4 lbs in 2 days  2) If swelling, where is the swelling located? No swelling   3) Are you currently taking a fluid pill? Yes.   4) Are you currently SOB? no  5) Do you have a log of your daily weights (if so, list)? no  6) Have you gained 3 pounds in a day or 5 pounds in a week? 4 pounds in 2 days  7) Have you traveled recently? no   Morrie Sheldon [facility] Nurse Case Manager called. The patient has experienced a 4 lb weight gain in two days. The Nurse Case Manager said that the patient has been taking his fluid pill as directed but has not been urinating.  Morrie Sheldon asks that we call the patient directly to follow up

## 2020-02-08 NOTE — Telephone Encounter (Signed)
Agree with plan. Thanks Elyzabeth Goatley, MD  

## 2020-02-10 ENCOUNTER — Telehealth: Payer: Self-pay | Admitting: Adult Health

## 2020-02-10 NOTE — Telephone Encounter (Signed)
Dr. Maple Hudson  Patient with Chronic Bronchitis  Wants refill of hydromet from Dr. Maple Hudson  .   Last rx refill by PCP 01/12/20.   Advised unable to fill controlled rx after hours/on call.   Rec: Mucinex DM or Delsym As needed   Will send to Dr. Maple Hudson  For review on return to office next week.   No fever, discolored mucus , loss of taste/smell.   Please contact office for sooner follow up if symptoms do not improve or worsen or seek emergency care

## 2020-02-11 DIAGNOSIS — J449 Chronic obstructive pulmonary disease, unspecified: Secondary | ICD-10-CM | POA: Diagnosis not present

## 2020-02-13 MED ORDER — HYDROCODONE-HOMATROPINE 5-1.5 MG/5ML PO SYRP: 5.0000 mL | ORAL_SOLUTION | Freq: Three times a day (TID) | ORAL | 0 refills | Status: DC | PRN

## 2020-02-13 NOTE — Telephone Encounter (Signed)
Hycodan cough syrup refilled- use it sparingly

## 2020-02-13 NOTE — Telephone Encounter (Signed)
Spoke with the pt and notified of response per Dr Maple Hudson and he verbalized understanding. Nothing further needed.

## 2020-02-15 ENCOUNTER — Encounter: Payer: Self-pay | Admitting: Family Medicine

## 2020-02-16 ENCOUNTER — Encounter: Payer: Self-pay | Admitting: Family Medicine

## 2020-02-22 ENCOUNTER — Encounter: Payer: Self-pay | Admitting: Podiatry

## 2020-02-23 ENCOUNTER — Encounter: Payer: Self-pay | Admitting: Internal Medicine

## 2020-02-24 ENCOUNTER — Ambulatory Visit (INDEPENDENT_AMBULATORY_CARE_PROVIDER_SITE_OTHER): Payer: Medicare Other | Admitting: Podiatry

## 2020-02-24 DIAGNOSIS — Z5329 Procedure and treatment not carried out because of patient's decision for other reasons: Secondary | ICD-10-CM

## 2020-02-24 NOTE — Progress Notes (Signed)
No show for appt. 

## 2020-03-01 DIAGNOSIS — E119 Type 2 diabetes mellitus without complications: Secondary | ICD-10-CM | POA: Diagnosis not present

## 2020-03-01 DIAGNOSIS — H25813 Combined forms of age-related cataract, bilateral: Secondary | ICD-10-CM | POA: Diagnosis not present

## 2020-03-01 DIAGNOSIS — H5703 Miosis: Secondary | ICD-10-CM | POA: Diagnosis not present

## 2020-03-02 DIAGNOSIS — J45998 Other asthma: Secondary | ICD-10-CM | POA: Diagnosis not present

## 2020-03-07 DIAGNOSIS — J45998 Other asthma: Secondary | ICD-10-CM | POA: Diagnosis not present

## 2020-03-13 DIAGNOSIS — J449 Chronic obstructive pulmonary disease, unspecified: Secondary | ICD-10-CM | POA: Diagnosis not present

## 2020-04-07 DIAGNOSIS — J45998 Other asthma: Secondary | ICD-10-CM | POA: Diagnosis not present

## 2020-04-09 ENCOUNTER — Ambulatory Visit: Payer: Medicare Other | Admitting: Internal Medicine

## 2020-04-09 DIAGNOSIS — J45998 Other asthma: Secondary | ICD-10-CM | POA: Diagnosis not present

## 2020-04-10 DIAGNOSIS — J449 Chronic obstructive pulmonary disease, unspecified: Secondary | ICD-10-CM | POA: Diagnosis not present

## 2020-04-18 NOTE — Progress Notes (Signed)
HPI  male former smoker followed for OSA, chronic bronchitis, SAR, CAD/MI/CABG/ CHF, HBP, DM, cirrhosis liver, Continues oxygen 2 L for sleep/ [facility]. PFT 06/18/2011-moderate restriction. Flows are normal for measure volume with FEV1/FVC 0.84 and insignificant response to bronchodilator. DLCO 52%. HST-08/05/2016-AHI 37.5/hour, desaturation to 67%, body weight 294 pounds HST 09/08/18- AHI 32.4/ hr, desaturation to 73%- avg 89%, body weight 298 lbs -------------------------------------------------------------------------------   04/19/20- 65 year old male former smoker followed for OSA, chronic bronchitis, SAR, CAD/MI/CABG, dCHF, DM2, PVD, hepatic cirrhosis, Morbid Obesity,  HST 09/08/18- AHI 32.4/ hr, desaturation to 73%- avg 89%, body weight 298 lbs CPAP auto 5-20/ [facility]  Ordered 11/01/2018 Download- compliance 0%, AHI 2.1/ hr Body weight today- Covid vax-2 phizer                          Wife here Flu vax-no -----Has been doing ok with his cpap.  He feels that his cpap needs to be adjusted. He stated that he had to remove the mask at times due to the air flow felt so strong.  He stated that he moved and he has not used it for about 2-3 weeks. Last download available is from January. Discussed compliance and goals. Asks refill hycodan cough syrup for "occasional cough only if catches cold". Has been constipated and having heartburn. Explained his cough syrup will be constipating No specific cardiac or pulmonary events since last here. Does have chronic peripheral edema CXR 07/03/19-  IMPRESSION: 1. No active cardiopulmonary disease. 2. Chronic interstitial lung changes.  // need CXR for cough if persistent//  ROS-see HPI   + = positive Constitutional:   No-   weight loss, night sweats, fevers, chills, + fatigue, lassitude. HEENT:   No-  headaches, difficulty swallowing, tooth/dental problems, sore throat,       No-  sneezing, itching, ear ache, nasal congestion, post nasal drip,  CV:   No-   chest pain, orthopnea, PND, + swelling in lower extremities, anasarca,  dizziness, palpitations Resp: + Shortness of breath with exertion or at rest.              No-   productive cough,  No non-productive cough,  No- coughing up of blood.              No-   change in color of mucus.  + Occasional wheezing.   Skin: No-   rash or lesions. GI:  No-   heartburn, indigestion, abdominal pain, nausea, vomiting,  GU: . MS:  No-   joint pain or swelling.   Neuro-     nothing unusual Psych:  No- change in mood or affect. No depression or anxiety.  No memory loss.   OBJ- Physical Exam General- Alert, Oriented, Affect-appropriate, Distress- none acute, +obese,  Skin- +stasis changes legs, + Increased skin turgor? Edema Lymphadenopathy- none Head- atraumatic            Eyes- Gross vision intact, PERRLA, conjunctivae and secretions clear            Ears- Hearing, canals-normal            Nose- Clear, no-Septal dev, mucus, polyps, erosion, perforation             Throat- Mallampati IV , mucosa clear , drainage- none, tonsils- atrophic, +missing teeth Neck- flexible , trachea midline, no stridor , thyroid nl, carotid no bruit Chest - symmetrical excursion , unlabored  Heart/CV- RRR , no murmur , no gallop  , no rub, nl s1 s2                           - JVD- none , edema- none, stasis changes- none, varices- none           Lung- clear to P&A, wheeze- none, cough- none , dullness-none, rub- none           Chest wall- sternotomy scar Abd- +bowel sounds Br/ Gen/ Rectal- Not done, not indicated Extrem- cyanosis- none, clubbing, none, atrophy- none, strength- nl. Vascular graft donor site scar left forearm. + Cane, + chronic brawny peripheral edema w stasis changes in calves. Neuro- grossly intact to observation

## 2020-04-19 ENCOUNTER — Other Ambulatory Visit: Payer: Self-pay

## 2020-04-19 ENCOUNTER — Encounter: Payer: Self-pay | Admitting: Internal Medicine

## 2020-04-19 ENCOUNTER — Ambulatory Visit (INDEPENDENT_AMBULATORY_CARE_PROVIDER_SITE_OTHER): Payer: Medicare Other | Admitting: Internal Medicine

## 2020-04-19 VITALS — BP 120/76 | HR 86 | Temp 97.2°F | Ht 70.0 in | Wt 304.1 lb

## 2020-04-19 DIAGNOSIS — R059 Cough, unspecified: Secondary | ICD-10-CM | POA: Diagnosis not present

## 2020-04-19 DIAGNOSIS — G4733 Obstructive sleep apnea (adult) (pediatric): Secondary | ICD-10-CM

## 2020-04-19 MED ORDER — HYDROCODONE-HOMATROPINE 5-1.5 MG/5ML PO SYRP: 5.0000 mL | ORAL_SOLUTION | Freq: Three times a day (TID) | ORAL | 0 refills | Status: DC | PRN

## 2020-04-19 NOTE — Assessment & Plan Note (Signed)
He brought his machine- no SD card. Asks lower pressure. Plan- change auto range to 5-10. [facility] to add AirView and card. Emphasized compliance goals.

## 2020-04-19 NOTE — Patient Instructions (Signed)
Order- DME [facility]- please reinstall AirView, add SD card, and change auto pressure to 5-10  Please call if we can help

## 2020-04-19 NOTE — Assessment & Plan Note (Signed)
He minimizes cough as occasional, with colds. I agreed to refill cough syrup this time, educating him about constipation from narcotics. If he requests again soon we should look at him harder for this complaint.

## 2020-04-20 ENCOUNTER — Other Ambulatory Visit: Payer: Self-pay | Admitting: Cardiology

## 2020-05-02 DIAGNOSIS — E1142 Type 2 diabetes mellitus with diabetic polyneuropathy: Secondary | ICD-10-CM | POA: Diagnosis not present

## 2020-05-02 DIAGNOSIS — E1165 Type 2 diabetes mellitus with hyperglycemia: Secondary | ICD-10-CM | POA: Diagnosis not present

## 2020-05-02 DIAGNOSIS — Z794 Long term (current) use of insulin: Secondary | ICD-10-CM | POA: Diagnosis not present

## 2020-05-02 DIAGNOSIS — Z8719 Personal history of other diseases of the digestive system: Secondary | ICD-10-CM | POA: Diagnosis not present

## 2020-05-03 DIAGNOSIS — E119 Type 2 diabetes mellitus without complications: Secondary | ICD-10-CM | POA: Diagnosis not present

## 2020-05-03 DIAGNOSIS — J45998 Other asthma: Secondary | ICD-10-CM | POA: Diagnosis not present

## 2020-05-03 DIAGNOSIS — Z008 Encounter for other general examination: Secondary | ICD-10-CM | POA: Diagnosis not present

## 2020-05-03 DIAGNOSIS — I10 Essential (primary) hypertension: Secondary | ICD-10-CM | POA: Diagnosis not present

## 2020-05-03 DIAGNOSIS — E785 Hyperlipidemia, unspecified: Secondary | ICD-10-CM | POA: Diagnosis not present

## 2020-05-05 DIAGNOSIS — J45998 Other asthma: Secondary | ICD-10-CM | POA: Diagnosis not present

## 2020-05-09 DIAGNOSIS — E1151 Type 2 diabetes mellitus with diabetic peripheral angiopathy without gangrene: Secondary | ICD-10-CM | POA: Diagnosis not present

## 2020-05-09 DIAGNOSIS — I739 Peripheral vascular disease, unspecified: Secondary | ICD-10-CM | POA: Diagnosis not present

## 2020-05-09 DIAGNOSIS — L603 Nail dystrophy: Secondary | ICD-10-CM | POA: Diagnosis not present

## 2020-05-09 DIAGNOSIS — L84 Corns and callosities: Secondary | ICD-10-CM | POA: Diagnosis not present

## 2020-05-15 ENCOUNTER — Other Ambulatory Visit: Payer: Self-pay | Admitting: Physician Assistant

## 2020-05-15 DIAGNOSIS — I10 Essential (primary) hypertension: Secondary | ICD-10-CM

## 2020-05-24 DIAGNOSIS — M71572 Other bursitis, not elsewhere classified, left ankle and foot: Secondary | ICD-10-CM | POA: Diagnosis not present

## 2020-05-24 DIAGNOSIS — M7731 Calcaneal spur, right foot: Secondary | ICD-10-CM | POA: Diagnosis not present

## 2020-05-24 DIAGNOSIS — M722 Plantar fascial fibromatosis: Secondary | ICD-10-CM | POA: Diagnosis not present

## 2020-05-24 DIAGNOSIS — M71571 Other bursitis, not elsewhere classified, right ankle and foot: Secondary | ICD-10-CM | POA: Diagnosis not present

## 2020-05-24 DIAGNOSIS — M7732 Calcaneal spur, left foot: Secondary | ICD-10-CM | POA: Diagnosis not present

## 2020-05-25 ENCOUNTER — Telehealth: Payer: Self-pay | Admitting: Family Medicine

## 2020-05-25 NOTE — Chronic Care Management (AMB) (Signed)
  Chronic Care Management   Outreach Note  05/25/2020 Name: [name] [name] MRN: [id number] DOB: November 19, 1955  Referred by: [name], MD Reason for referral : No chief complaint on file.   An unsuccessful telephone outreach was attempted today. The patient was referred to the pharmacist for assistance with care management and care coordination.   Follow Up Plan:   Lauretta Grill Upstream Scheduler

## 2020-06-05 DIAGNOSIS — J45998 Other asthma: Secondary | ICD-10-CM | POA: Diagnosis not present

## 2020-06-12 ENCOUNTER — Telehealth: Payer: Self-pay | Admitting: Family Medicine

## 2020-06-12 NOTE — Chronic Care Management (AMB) (Signed)
  Chronic Care Management   Outreach Note  06/12/2020 Name: SAGAR TENGAN MRN: [id number] DOB: 1955-05-03  Referred by: [name], MD Reason for referral : No chief complaint on file.   A second unsuccessful telephone outreach was attempted today. The patient was referred to pharmacist for assistance with care management and care coordination.  Follow Up Plan:   Lauretta Grill [employer] Scheduler

## 2020-06-13 ENCOUNTER — Telehealth: Payer: Self-pay | Admitting: Internal Medicine

## 2020-06-13 MED ORDER — FLUTICASONE PROPIONATE 50 MCG/ACT NA SUSP: 1.0000 | Freq: Every day | NASAL | 12 refills | Status: DC

## 2020-06-13 MED ORDER — PROMETHAZINE-CODEINE 6.25-10 MG/5ML PO SYRP: 5.0000 mL | ORAL_SOLUTION | Freq: Four times a day (QID) | ORAL | 0 refills | Status: DC | PRN

## 2020-06-13 NOTE — Telephone Encounter (Signed)
Received faxed refill request from  Pt called the office  Medication name/strength/dose: flonase 50 MGT, Hycodan Medication last rx'd: flonase 06/14/18, hycodan 04/19/2020 Quantity and number of refills last rx'd: flonase 16 g with 12 refills, hycodan 293ml with no refills Instructions: 1 spray in both nostrils daily;  5 mls by mouth every 8 hours as needed for cough  Last OV: 04/19/2020 Next OV: 04/19/2021  CY  please advise on refill request  Allergies  Allergen Reactions  . Ibuprofen Other (See Comments)    Stomach pain  . Empagliflozin Rash   Current Outpatient Medications on File Prior to Visit  Medication Sig Dispense Refill  . albuterol (PROVENTIL HFA;VENTOLIN HFA) 108 (90 Base) MCG/ACT inhaler Inhale 2 puffs into the lungs every 6 (six) hours as needed for wheezing or shortness of breath. 1 Inhaler 12  . albuterol (PROVENTIL) (2.5 MG/3ML) 0.083% nebulizer solution Take 3 mLs (2.5 mg total) by nebulization every 6 (six) hours as needed for wheezing or shortness of breath. 75 mL prn  . aspirin EC 81 MG tablet Take 81 mg by mouth daily.    . B-D ULTRAFINE III SHORT PEN 31G X 8 MM MISC   1  . collagenase (SANTYL) ointment Apply 1 application topically daily. Right ankle measures 6.0 x 0.3cm. 30 g 5  . diclofenac Sodium (VOLTAREN) 1 % GEL APPLY 2 GRAMS TOPICALLY 4 TIMES DAILY TO KNEE 100 g 2  . eplerenone (INSPRA) 25 MG tablet TAKE 1 TABLET(25 MG) BY MOUTH DAILY 90 tablet 2  . famotidine (PEPCID) 20 MG tablet Take 1 tablet (20 mg total) by mouth daily. 90 tablet 3  . fluticasone (FLONASE) 50 MCG/ACT nasal spray Place 1 spray into both nostrils daily. 16 g 12  . HUMULIN R U-500 KWIKPEN 500 UNIT/ML kwikpen Inject 50-60 Units into the skin See admin instructions. 60 units in the AM, 50 units at lunch and 50 in the evening 3 mL 0  . HYDROcodone-homatropine (HYCODAN) 5-1.5 MG/5ML syrup Take 5 mLs by mouth every 8 (eight) hours as needed for cough. 200 mL 0  . Lancets (ACCU-CHEK MULTICLIX)  lancets     . LANTUS SOLOSTAR 100 UNIT/ML Solostar Pen SMARTSIG:55 Unit(s) Topical Every Morning    . losartan (COZAAR) 50 MG tablet TAKE 1/2 TABLET BY MOUTH DAILY 45 tablet 1  . MAGNESIUM-OXIDE 400 (241.3 Mg) MG tablet Take 1 tablet by mouth 2 (two) times daily.    . metoprolol tartrate (LOPRESSOR) 50 MG tablet TAKE 1 TABLET(50 MG) BY MOUTH TWICE DAILY 180 tablet 3  . nitroGLYCERIN (NITROSTAT) 0.4 MG SL tablet Place 1 tablet (0.4 mg total) under the tongue every 5 (five) minutes as needed for chest pain. 25 tablet 4  . nystatin ointment (MYCOSTATIN) Apply 1 application topically 2 (two) times daily. 30 g 0  . ONETOUCH ULTRA test strip USE 8 TIMES DAILY TO CHECK BLOOD SUGAR 800 strip 0  . polyethylene glycol (MIRALAX / GLYCOLAX) 17 g packet Take 17 g by mouth daily. 14 each 0  . potassium chloride SA (KLOR-CON) 20 MEQ tablet TAKE 2 TABLETS(40 MEQ) BY MOUTH THREE TIMES DAILY 180 tablet 8  . rosuvastatin (CRESTOR) 20 MG tablet Take 1 tablet (20 mg total) by mouth every evening. 30 tablet 11  . silver sulfADIAZINE (SILVADENE) 1 % cream Apply pea-sized amount to wound daily. 50 g 0  . torsemide (DEMADEX) 20 MG tablet TAKE 1 TABLET(20 MG) BY MOUTH TWICE DAILY 180 tablet 3  . [DISCONTINUED] simvastatin (ZOCOR) 80 MG tablet  Take 80 mg by mouth daily.       No current facility-administered medications on file prior to visit.

## 2020-06-13 NOTE — Telephone Encounter (Signed)
[facility] unable to fill Hycodan Rx to I changed it to promethazine with codeine

## 2020-06-14 NOTE — Telephone Encounter (Signed)
Called and spoke to pt. Informed him of the recs per CY. Pt verbalized understanding and denied any further questions or concerns at this time.   

## 2020-06-17 ENCOUNTER — Other Ambulatory Visit: Payer: Self-pay | Admitting: Cardiology

## 2020-07-05 DIAGNOSIS — J45998 Other asthma: Secondary | ICD-10-CM | POA: Diagnosis not present

## 2020-07-13 ENCOUNTER — Telehealth: Payer: Self-pay | Admitting: Family Medicine

## 2020-07-13 NOTE — Progress Notes (Signed)
  Chronic Care Management   Outreach Note  07/13/2020 Name: [name] [name] MRN: [id number] DOB: 1955-04-05  Referred by: [name], MD Reason for referral : No chief complaint on file.   A second unsuccessful telephone outreach was attempted today. The patient was referred to pharmacist for assistance with care management and care coordination.  Follow Up Plan:   Lauretta Grill Up[employer] Scheduler

## 2020-08-03 DIAGNOSIS — E1142 Type 2 diabetes mellitus with diabetic polyneuropathy: Secondary | ICD-10-CM | POA: Diagnosis not present

## 2020-08-03 DIAGNOSIS — Z8719 Personal history of other diseases of the digestive system: Secondary | ICD-10-CM | POA: Diagnosis not present

## 2020-08-03 DIAGNOSIS — M722 Plantar fascial fibromatosis: Secondary | ICD-10-CM | POA: Diagnosis not present

## 2020-08-03 DIAGNOSIS — Z794 Long term (current) use of insulin: Secondary | ICD-10-CM | POA: Diagnosis not present

## 2020-08-03 DIAGNOSIS — M7752 Other enthesopathy of left foot: Secondary | ICD-10-CM | POA: Diagnosis not present

## 2020-08-03 DIAGNOSIS — E1165 Type 2 diabetes mellitus with hyperglycemia: Secondary | ICD-10-CM | POA: Diagnosis not present

## 2020-08-05 DIAGNOSIS — J45998 Other asthma: Secondary | ICD-10-CM | POA: Diagnosis not present

## 2020-08-17 ENCOUNTER — Other Ambulatory Visit: Payer: Self-pay | Admitting: Cardiology

## 2020-08-26 ENCOUNTER — Other Ambulatory Visit: Payer: Self-pay | Admitting: Cardiology

## 2020-08-27 DIAGNOSIS — M7752 Other enthesopathy of left foot: Secondary | ICD-10-CM | POA: Diagnosis not present

## 2020-08-27 DIAGNOSIS — M722 Plantar fascial fibromatosis: Secondary | ICD-10-CM | POA: Diagnosis not present

## 2020-08-29 DIAGNOSIS — E1165 Type 2 diabetes mellitus with hyperglycemia: Secondary | ICD-10-CM | POA: Diagnosis not present

## 2020-09-02 ENCOUNTER — Emergency Department (HOSPITAL_COMMUNITY)
Admission: EM | Admit: 2020-09-02 | Discharge: 2020-09-02 | Disposition: A | Discharge status: Home / Self Care | Payer: Medicare Other | Attending: Emergency Medicine | Admitting: Emergency Medicine

## 2020-09-02 ENCOUNTER — Other Ambulatory Visit: Payer: Self-pay

## 2020-09-02 DIAGNOSIS — U071 COVID-19: Secondary | ICD-10-CM | POA: Insufficient documentation

## 2020-09-02 DIAGNOSIS — Z87891 Personal history of nicotine dependence: Secondary | ICD-10-CM | POA: Diagnosis not present

## 2020-09-02 DIAGNOSIS — I5032 Chronic diastolic (congestive) heart failure: Secondary | ICD-10-CM | POA: Insufficient documentation

## 2020-09-02 DIAGNOSIS — Z951 Presence of aortocoronary bypass graft: Secondary | ICD-10-CM | POA: Insufficient documentation

## 2020-09-02 DIAGNOSIS — I130 Hypertensive heart and chronic kidney disease with heart failure and stage 1 through stage 4 chronic kidney disease, or unspecified chronic kidney disease: Secondary | ICD-10-CM | POA: Diagnosis not present

## 2020-09-02 DIAGNOSIS — E1140 Type 2 diabetes mellitus with diabetic neuropathy, unspecified: Secondary | ICD-10-CM | POA: Insufficient documentation

## 2020-09-02 DIAGNOSIS — Z79899 Other long term (current) drug therapy: Secondary | ICD-10-CM | POA: Insufficient documentation

## 2020-09-02 DIAGNOSIS — E1122 Type 2 diabetes mellitus with diabetic chronic kidney disease: Secondary | ICD-10-CM | POA: Diagnosis not present

## 2020-09-02 DIAGNOSIS — R0989 Other specified symptoms and signs involving the circulatory and respiratory systems: Secondary | ICD-10-CM | POA: Diagnosis present

## 2020-09-02 DIAGNOSIS — I2510 Atherosclerotic heart disease of native coronary artery without angina pectoris: Secondary | ICD-10-CM | POA: Diagnosis not present

## 2020-09-02 DIAGNOSIS — Z794 Long term (current) use of insulin: Secondary | ICD-10-CM | POA: Insufficient documentation

## 2020-09-02 DIAGNOSIS — Z7982 Long term (current) use of aspirin: Secondary | ICD-10-CM | POA: Diagnosis not present

## 2020-09-02 DIAGNOSIS — N182 Chronic kidney disease, stage 2 (mild): Secondary | ICD-10-CM | POA: Insufficient documentation

## 2020-09-02 LAB — CBC
HCT: 43.9 % (ref 39.0–52.0)
Hemoglobin: 14.8 g/dL (ref 13.0–17.0)
MCH: 33.2 pg (ref 26.0–34.0)
MCHC: 33.7 g/dL (ref 30.0–36.0)
MCV: 98.4 fL (ref 80.0–100.0)
Platelets: 169 10*3/uL (ref 150–400)
RBC: 4.46 MIL/uL (ref 4.22–5.81)
RDW: 13.5 % (ref 11.5–15.5)
WBC: 5 10*3/uL (ref 4.0–10.5)
nRBC: 0 % (ref 0.0–0.2)

## 2020-09-02 LAB — BASIC METABOLIC PANEL
Anion gap: 9 (ref 5–15)
BUN: 20 mg/dL (ref 8–23)
CO2: 29 mmol/L (ref 22–32)
Calcium: 8.8 mg/dL — ABNORMAL LOW (ref 8.9–10.3)
Chloride: 95 mmol/L — ABNORMAL LOW (ref 98–111)
Creatinine, Ser: 1.31 mg/dL — ABNORMAL HIGH (ref 0.61–1.24)
GFR, Estimated: >60 mL/min (ref >60)
Glucose, Bld: 318 mg/dL — ABNORMAL HIGH (ref 70–99)
Potassium: 3.6 mmol/L (ref 3.5–5.1)
Sodium: 133 mmol/L — ABNORMAL LOW (ref 135–145)

## 2020-09-02 MED ORDER — BENZONATATE 100 MG PO CAPS: 100.0000 mg | ORAL_CAPSULE | Freq: Three times a day (TID) | ORAL | 0 refills | Status: DC

## 2020-09-02 MED ORDER — MOLNUPIRAVIR EUA 200MG CAPSULE: 4.0000 | ORAL_CAPSULE | Freq: Two times a day (BID) | ORAL | 0 refills | Status: AC

## 2020-09-02 NOTE — ED Provider Notes (Signed)
[facility] DEPT Provider Note   CSN: [id number] Arrival date & time: 09/02/20  0134     History No chief complaint on file.   [name] [name] is a 65 y.o. male.  Patient presents to the emergency department with a chief complaint of chest congestion.  States that he had a home COVID test that was positive a few hours ago.  He denies any fevers or chills.  Denies any successful treatments prior to arrival.  Denies any other associated symptoms tonight.  His symptoms have been gradually worsening.  The history is provided by the patient. No language interpreter was used.      Past Medical History:  Diagnosis Date   Acute myocardial infarction, unspecified site, episode of care unspecified 2005   Acute pancreatitis    CAD (coronary artery disease)    a. CABG in 2005 w LIMA to LAD, left radial to second circumflex marginal, saphenous vein graft to PDA, saphenous vein graft to lateral subbrach of ramus intermediate, and sequential saphenous vein graft to the medial subbranch of ramus intermediate.   Chronic diastolic CHF (congestive heart failure) (HCC)    Cirrhosis of liver without mention of alcohol    CKD (chronic kidney disease), stage II    Complications affecting other specified body systems, hypertension    Ectopic atrial rhythm    Essential hypertension    Hyperlipidemia    Hypokalemia    a. intermitttent noncompliance with potassium supplement.   Hypomagnesemia    Morbid obesity (HCC)    Neuropathy    Other and unspecified hyperlipidemia    Proteinuria    RBBB    Sleep apnea    Type II or unspecified type diabetes mellitus without mention of complication, not stated as uncontrolled    Varicose veins of both lower extremities     Patient Active Problem List   Diagnosis Date Noted   Cough 10/31/2019   Intertrigo 10/31/2019   CAD (coronary artery disease)    Type 2 diabetes mellitus ([place])    Bilateral lower extremity edema 10/27/2018    Type 2 diabetes mellitus with other specified complication ([place]), followed by Dr. Buddy Duty 10/14/2018   Chronic heart failure with preserved ejection fraction ([place]) 08/29/2017   Ectopic atrial rhythm 05/10/2017   Varicose vein of leg 04/13/2017   Peripheral vascular disease ([place]) 04/13/2017   Primary osteoarthritis of both knees 01/21/2017   Coronary artery disease involving native coronary artery of native heart without angina pectoris 10/29/2016   Seasonal and perennial allergic rhinitis 07/17/2016   Ulnar neuritis, left 03/27/2016   Osteoarthritis of spine with radiculopathy, cervical region 03/27/2016   PVD (peripheral vascular disease) ([place]) 04/30/2009   HLD (hyperlipidemia) 01/04/2007   Hepatic cirrhosis ([place]) 01/04/2007   Obstructive sleep apnea 01/04/2007   HTN (hypertension) 01/04/2007    Past Surgical History:  Procedure Laterality Date   CORONARY ARTERY BYPASS GRAFT     2005   I & D EXTREMITY Left 09/02/2019   Procedure: IRRIGATION AND DEBRIDEMENT 4th TOE DEBRIDIMENT;  Surgeon: Evelina Bucy, DPM;  Location: [facility];  Service: Podiatry;  Laterality: Left;       Family History  Problem Relation Age of Onset   Breast cancer Mother    Prostate cancer Father    Coronary artery disease Father        underwent CABG    Social History   Tobacco Use   Smoking status: Former    Packs/day: 1.50    Years:  30.00    Pack years: 45.00    Types: Cigarettes    Quit date: 10/20/2004    Years since quitting: 15.8   Smokeless tobacco: Never  Vaping Use   Vaping Use: Never used  Substance Use Topics   Alcohol use: Yes    Comment: occasional   Drug use: No    Home Medications Prior to Admission medications   Medication Sig Start Date End Date Taking? Authorizing Provider  albuterol (PROVENTIL HFA;VENTOLIN HFA) 108 (90 Base) MCG/ACT inhaler Inhale 2 puffs into the lungs every 6 (six) hours as needed for wheezing or shortness of breath. 07/06/15   Baird Lyons D, MD   albuterol (PROVENTIL) (2.5 MG/3ML) 0.083% nebulizer solution Take 3 mLs (2.5 mg total) by nebulization every 6 (six) hours as needed for wheezing or shortness of breath. 04/29/13   Baird Lyons D, MD  aspirin EC 81 MG tablet Take 81 mg by mouth daily.    [provider]  B-D ULTRAFINE III SHORT PEN 31G X 8 MM MISC  03/11/16   [provider]  collagenase (SANTYL) ointment Apply 1 application topically daily. Right ankle measures 6.0 x 0.3cm. 07/15/19   Evelina Bucy, DPM  diclofenac Sodium (VOLTAREN) 1 % GEL APPLY 2 GRAMS TOPICALLY 4 TIMES DAILY TO KNEE 01/12/20   [name], MD  eplerenone (INSPRA) 25 MG tablet TAKE 1 TABLET(25 MG) BY MOUTH DAILY 04/20/20   [name], MD  famotidine (PEPCID) 20 MG tablet Take 1 tablet (20 mg total) by mouth daily. 12/12/19   [name], MD  fluticasone (FLONASE) 50 MCG/ACT nasal spray Place 1 spray into both nostrils daily. 06/13/20   Deneise Lever, MD  HUMULIN R U-500 KWIKPEN 500 UNIT/ML kwikpen Inject 50-60 Units into the skin See admin instructions. 60 units in the AM, 50 units at lunch and 50 in the evening 01/12/20   [name], MD  HYDROcodone-homatropine [facility]) 5-1.5 MG/5ML syrup Take 5 mLs by mouth every 8 (eight) hours as needed for cough. 04/19/20   Deneise Lever, MD  Lancets (ACCU-CHEK MULTICLIX) lancets  02/15/16   [provider]  LANTUS SOLOSTAR 100 UNIT/ML Solostar Pen SMARTSIG:55 Unit(s) Topical Every Morning 09/22/19   [provider]  losartan (COZAAR) 50 MG tablet TAKE 1/2 TABLET BY MOUTH DAILY 05/16/20   Dunn, Dayna N, PA-C  MAGNESIUM-OXIDE 400 (241.3 Mg) MG tablet Take 1 tablet by mouth 2 (two) times daily. 10/06/19   [provider]  metoprolol tartrate (LOPRESSOR) 50 MG tablet TAKE 1 TABLET(50 MG) BY MOUTH TWICE DAILY 08/17/20   [name], MD  nitroGLYCERIN (NITROSTAT) 0.4 MG SL tablet Place 1 tablet (0.4 mg total) under the tongue every 5 (five) minutes as needed for chest pain.  12/12/19   [name], MD  nystatin ointment (MYCOSTATIN) Apply 1 application topically 2 (two) times daily. 10/31/19   [name], MD  [facility] ULTRA test strip USE 8 TIMES DAILY TO CHECK BLOOD SUGAR 01/27/20   [name], MD  polyethylene glycol (MIRALAX / GLYCOLAX) 17 g packet Take 17 g by mouth daily. 09/03/19   Bonnielee Haff, MD  potassium chloride SA (KLOR-CON) 20 MEQ tablet TAKE 2 TABLETS(40 MEQ) BY MOUTH THREE TIMES DAILY 08/28/20   [name], MD  promethazine-codeine (PHENERGAN WITH CODEINE) 6.25-10 MG/5ML syrup Take 5 mLs by mouth every 6 (six) hours as needed for cough. 06/13/20   Baird Lyons D, MD  rosuvastatin (CRESTOR) 20 MG  tablet Take 1 tablet (20 mg total) by mouth every evening. 06/24/18   Dunn, Nedra Hai, PA-C  silver sulfADIAZINE (SILVADENE) 1 % cream Apply pea-sized amount to wound daily. 01/12/20   [name], MD  torsemide (DEMADEX) 20 MG tablet TAKE 1 TABLET(20 MG) BY MOUTH TWICE DAILY 08/17/20   [name], MD  simvastatin (ZOCOR) 80 MG tablet Take 80 mg by mouth daily.    06/03/19  [provider]    Allergies    Ibuprofen and Empagliflozin  Review of Systems   Review of Systems  All other systems reviewed and are negative.  Physical Exam Updated Vital Signs BP (!) 146/75 (BP Location: Right Arm)   Pulse 91   Temp 99.1 F (37.3 C) (Oral)   Resp 18   SpO2 95%   Physical Exam Vitals and nursing note reviewed.  Constitutional:      General: He is not in acute distress.    Appearance: He is well-developed. He is not ill-appearing.  HENT:     Head: Normocephalic and atraumatic.  Eyes:     Conjunctiva/sclera: Conjunctivae normal.  Cardiovascular:     Rate and Rhythm: Normal rate.  Pulmonary:     Effort: Pulmonary effort is normal. No respiratory distress.  Abdominal:     General: There is no distension.  Musculoskeletal:     Cervical back: Neck supple.     Comments: Moves all extremities  Skin:    General: Skin is warm  and dry.  Neurological:     Mental Status: He is alert and oriented to person, place, and time.  Psychiatric:        Mood and Affect: Mood normal.        Behavior: Behavior normal.    ED Results / Procedures / Treatments   Labs (all labs ordered are listed, but only abnormal results are displayed) Labs Reviewed  CBC  BASIC METABOLIC PANEL    EKG None  Radiology No results found.  Procedures Procedures   Medications Ordered in ED Medications - No data to display  ED Course  I have reviewed the triage vital signs and the nursing notes.  Pertinent labs & imaging results that were available during my care of the patient were reviewed by me and considered in my medical decision making (see chart for details).    MDM Rules/Calculators/A&P                           [name] [name] was evaluated in Emergency Department on 09/02/2020 for the symptoms described in the history of present illness. He was evaluated in the context of the global COVID-19 pandemic, which necessitated consideration that the patient might be at risk for infection with the SARS-CoV-2 virus that causes COVID-19. Institutional protocols and algorithms that pertain to the evaluation of patients at risk for COVID-19 are in a state of rapid change based on information released by regulatory bodies including the CDC and federal and state organizations. These policies and algorithms were followed during the patient's care in the ED.  Patient here with positive home COVID test.  Pretreatment screening labs ordered for Paxlovid. Vital signs are stable.  Patient does not appear to be in any acute distress.  Feel that patient can be managed at home.  Patient has drug interactions with Paxlovid, molnupiravir prescribed. Final Clinical Impression(s) / ED Diagnoses Final diagnoses:  [id number]    Rx / DC Orders ED Discharge Orders  None        Montine Circle, PA-C 09/02/20 [id number]    Maudie Flakes,  MD 09/02/20 [phone]

## 2020-09-02 NOTE — Discharge Instructions (Addendum)
Take medications as prescribed.  If your symptoms change or worsen significantly, follow-up with your doctor, and urgent care, or return to the emergency department.

## 2020-09-02 NOTE — ED Triage Notes (Signed)
Pt came in with c/o chest congestion. Took home Covid test four hours ago and positive

## 2020-09-03 ENCOUNTER — Telehealth: Payer: Self-pay

## 2020-09-03 ENCOUNTER — Telehealth: Payer: Self-pay | Admitting: Internal Medicine

## 2020-09-03 MED ORDER — PROMETHAZINE-CODEINE 6.25-10 MG/5ML PO SYRP: 5.0000 mL | ORAL_SOLUTION | Freq: Four times a day (QID) | ORAL | 0 refills | Status: DC | PRN

## 2020-09-03 NOTE — Telephone Encounter (Signed)
LMTCB again for pt

## 2020-09-03 NOTE — Telephone Encounter (Signed)
Patient was seen in ED yesterday.   Nurse Assessment Nurse: Terence Lux, RN, Christine Date/Time (Eastern Time): 09/01/2020 11:48:17 PM Confirm and document reason for call. If symptomatic, describe symptoms. ---Caller states Covid test was positive, c/o fever of 103, and coughing phlegm. He states he feels okay. Does the patient have any new or worsening symptoms? ---Yes Will a triage be completed? ---Yes Related visit to physician within the last 2 weeks? ---No Does the PT have any chronic conditions? (i.e. diabetes, asthma, this includes High risk factors for pregnancy, etc.) ---Yes List chronic conditions. ---open heart surgery, dm, Is this a behavioral health or substance abuse call? ---No Guidelines Guideline Title Affirmed Question Affirmed Notes Nurse Date/Time (Eastern Time) COVID-19 - Diagnosed or Suspected [1] Fever > 101 F (38.3 C) AND [2] age > 25 years Terence Lux, Programmer, applications 09/01/2020 11:49:55 PM Disp. Time Eilene Ghazi Time) Disposition Final User 09/02/2020 12:00:48 AM See HCP within 4 Hours (or PCP triage) Yes Terence Lux, RN, Altha Harm

## 2020-09-03 NOTE — Telephone Encounter (Signed)
Se note  

## 2020-09-03 NOTE — Telephone Encounter (Signed)
I will send in the cough syrup. With  his age and health issues- has he been offered paxlovid? If he agrees- please send script for Paxlovid BID x 5 days

## 2020-09-03 NOTE — Telephone Encounter (Signed)
Spoke to patient, who stated that he tested positive for covid on 09/02/2020. He reports of prod cough with clear sputum,   Denied sob, fever, chills, sweats, wheezing or additional sx.  He would like a Rx of Promethazine-Codeine.  Dr. Annamaria Boots, please advise. Thanks  Current Outpatient Medications on File Prior to Visit  Medication Sig Dispense Refill   albuterol (PROVENTIL HFA;VENTOLIN HFA) 108 (90 Base) MCG/ACT inhaler Inhale 2 puffs into the lungs every 6 (six) hours as needed for wheezing or shortness of breath. 1 Inhaler 12   albuterol (PROVENTIL) (2.5 MG/3ML) 0.083% nebulizer solution Take 3 mLs (2.5 mg total) by nebulization every 6 (six) hours as needed for wheezing or shortness of breath. 75 mL prn   aspirin EC 81 MG tablet Take 81 mg by mouth daily.     B-D ULTRAFINE III SHORT PEN 31G X 8 MM MISC   1   benzonatate (TESSALON) 100 MG capsule Take 1 capsule (100 mg total) by mouth every 8 (eight) hours. 21 capsule 0   collagenase (SANTYL) ointment Apply 1 application topically daily. Right ankle measures 6.0 x 0.3cm. 30 g 5   diclofenac Sodium (VOLTAREN) 1 % GEL APPLY 2 GRAMS TOPICALLY 4 TIMES DAILY TO KNEE 100 g 2   eplerenone (INSPRA) 25 MG tablet TAKE 1 TABLET(25 MG) BY MOUTH DAILY 90 tablet 2   famotidine (PEPCID) 20 MG tablet Take 1 tablet (20 mg total) by mouth daily. 90 tablet 3   fluticasone (FLONASE) 50 MCG/ACT nasal spray Place 1 spray into both nostrils daily. 16 g 12   HUMULIN R U-500 KWIKPEN 500 UNIT/ML kwikpen Inject 50-60 Units into the skin See admin instructions. 60 units in the AM, 50 units at lunch and 50 in the evening 3 mL 0   HYDROcodone-homatropine (HYCODAN) 5-1.5 MG/5ML syrup Take 5 mLs by mouth every 8 (eight) hours as needed for cough. 200 mL 0   Lancets (ACCU-CHEK MULTICLIX) lancets      LANTUS SOLOSTAR 100 UNIT/ML Solostar Pen SMARTSIG:55 Unit(s) Topical Every Morning     losartan (COZAAR) 50 MG tablet TAKE 1/2 TABLET BY MOUTH DAILY 45 tablet 1   MAGNESIUM-OXIDE  400 (241.3 Mg) MG tablet Take 1 tablet by mouth 2 (two) times daily.     metoprolol tartrate (LOPRESSOR) 50 MG tablet TAKE 1 TABLET(50 MG) BY MOUTH TWICE DAILY 180 tablet 0   molnupiravir EUA 200 mg CAPS Take 4 capsules (800 mg total) by mouth 2 (two) times daily for 5 days. 40 capsule 0   nitroGLYCERIN (NITROSTAT) 0.4 MG SL tablet Place 1 tablet (0.4 mg total) under the tongue every 5 (five) minutes as needed for chest pain. 25 tablet 4   nystatin ointment (MYCOSTATIN) Apply 1 application topically 2 (two) times daily. 30 g 0   ONETOUCH ULTRA test strip USE 8 TIMES DAILY TO CHECK BLOOD SUGAR 800 strip 0   polyethylene glycol (MIRALAX / GLYCOLAX) 17 g packet Take 17 g by mouth daily. 14 each 0   potassium chloride SA (KLOR-CON) 20 MEQ tablet TAKE 2 TABLETS(40 MEQ) BY MOUTH THREE TIMES DAILY 540 tablet 0   promethazine-codeine (PHENERGAN WITH CODEINE) 6.25-10 MG/5ML syrup Take 5 mLs by mouth every 6 (six) hours as needed for cough. 200 mL 0   rosuvastatin (CRESTOR) 20 MG tablet Take 1 tablet (20 mg total) by mouth every evening. 30 tablet 11   silver sulfADIAZINE (SILVADENE) 1 % cream Apply pea-sized amount to wound daily. 50 g 0   torsemide (DEMADEX) 20  MG tablet TAKE 1 TABLET(20 MG) BY MOUTH TWICE DAILY 180 tablet 0   [DISCONTINUED] simvastatin (ZOCOR) 80 MG tablet Take 80 mg by mouth daily.       No current facility-administered medications on file prior to visit.    Allergies  Allergen Reactions   Ibuprofen Other (See Comments)    Stomach pain   Empagliflozin Rash

## 2020-09-03 NOTE — Telephone Encounter (Signed)
See note

## 2020-09-03 NOTE — Telephone Encounter (Signed)
Lm for patient.  

## 2020-09-04 ENCOUNTER — Telehealth: Payer: Self-pay | Admitting: Internal Medicine

## 2020-09-04 DIAGNOSIS — J45998 Other asthma: Secondary | ICD-10-CM | POA: Diagnosis not present

## 2020-09-04 MED ORDER — PAXLOVID 10 X 150 MG & 10 X 100MG PO TBPK: 2.0000 | ORAL_TABLET | Freq: Two times a day (BID) | ORAL | 0 refills | Status: AC

## 2020-09-04 NOTE — Telephone Encounter (Signed)
I called and spoke with patient regarding Paxlovid script. Patient stated that [facility] on [address] usually sends a notification when med is ready to be picked up and has not received that yet. I informed patient that Dr. Annamaria Boots sent it in at 09:26am this morning and have receipt of confirmation. Patient will keep trying to reach pharmacy and let us know if they do not have it. Nothing further needed.

## 2020-09-04 NOTE — Telephone Encounter (Signed)
Called and spoke with pt letting him know that [place] wanted [place] to send in Rx for paxlovid which will help treat the covid if he is okay with it and pt verbalized understanding. Rx has been sent to preferred pharmacy. Pt stated that he has already picked up the cough med. Nothing further needed.

## 2020-09-04 NOTE — Addendum Note (Signed)
Addended by: Lorretta Harp on: 09/04/2020 09:52 AM   Modules accepted: Orders

## 2020-09-06 ENCOUNTER — Other Ambulatory Visit: Payer: Self-pay | Admitting: Family Medicine

## 2020-09-06 MED ORDER — SILVER SULFADIAZINE 1 % EX CREA: TOPICAL_CREAM | CUTANEOUS | 0 refills | Status: DC

## 2020-10-04 ENCOUNTER — Telehealth: Payer: Self-pay | Admitting: Internal Medicine

## 2020-10-04 DIAGNOSIS — R059 Cough, unspecified: Secondary | ICD-10-CM

## 2020-10-04 NOTE — Telephone Encounter (Signed)
Suggest he come in to see me or an APP. Probably will need CXR. We need to see what is going on- can't just keep giving cough medicine.

## 2020-10-04 NOTE — Telephone Encounter (Signed)
I have called the pt and he stated that he had covid the end of July.  Since that time he has had a cough that will not clear up.  He stated that he was taking the hycodan and tessalon but he is out of these now.  He has not been using mucinex but he stated that he can tell he has mucus in his chest but cannot get it up.  His cough is non productive.  CY please advise on any further recs.  Thanks

## 2020-10-04 NOTE — Telephone Encounter (Signed)
Call returned to patient, confirmed DOB. Appt made with App. Order for CXR placed.   Nothing further needed at this time.

## 2020-10-05 DIAGNOSIS — J45998 Other asthma: Secondary | ICD-10-CM | POA: Diagnosis not present

## 2020-10-17 ENCOUNTER — Other Ambulatory Visit: Payer: Self-pay

## 2020-10-17 ENCOUNTER — Ambulatory Visit (INDEPENDENT_AMBULATORY_CARE_PROVIDER_SITE_OTHER): Payer: Medicare Other

## 2020-10-17 ENCOUNTER — Ambulatory Visit (INDEPENDENT_AMBULATORY_CARE_PROVIDER_SITE_OTHER): Payer: Medicare Other | Admitting: Primary Care

## 2020-10-17 ENCOUNTER — Encounter: Payer: Self-pay | Admitting: Primary Care

## 2020-10-17 VITALS — BP 120/70 | HR 77 | Temp 98.2°F | Ht 70.0 in | Wt 296.8 lb

## 2020-10-17 DIAGNOSIS — G4733 Obstructive sleep apnea (adult) (pediatric): Secondary | ICD-10-CM

## 2020-10-17 DIAGNOSIS — R0602 Shortness of breath: Secondary | ICD-10-CM | POA: Diagnosis not present

## 2020-10-17 DIAGNOSIS — R059 Cough, unspecified: Secondary | ICD-10-CM | POA: Diagnosis not present

## 2020-10-17 DIAGNOSIS — J40 Bronchitis, not specified as acute or chronic: Secondary | ICD-10-CM | POA: Diagnosis not present

## 2020-10-17 MED ORDER — BENZONATATE 100 MG PO CAPS: 200.0000 mg | ORAL_CAPSULE | Freq: Three times a day (TID) | ORAL | 1 refills | Status: DC

## 2020-10-17 MED ORDER — ALBUTEROL SULFATE HFA 108 (90 BASE) MCG/ACT IN AERS: 2.0000 | INHALATION_SPRAY | Freq: Four times a day (QID) | RESPIRATORY_TRACT | 1 refills | Status: DC | PRN

## 2020-10-17 NOTE — Assessment & Plan Note (Addendum)
-   Hx chronic bronchitis. CXR in 2021 showed interstitial changes. He had covid in July 2022 treated with paxlovid. Continues to have persistent np cough.  We will try to keep him of hycodan cough syrup. Recommend he try OTC Delsym, continue mucinex '600mg'$  BID for congestion and take tesslon perles '200mg'$  TID as needed for cough. Obtained CXR today, results pending. Consider HRCT chest if cough persists.

## 2020-10-17 NOTE — Patient Instructions (Addendum)
Recommendations: Try over the counter delsym cough syrup every 12 hours  Use tessalon perles three times a day as needed for cough Take mucinex twice a day for congestion Please get CPAP out of storage- replace mask/tubing when you do and clean water chamber before using I will call you once the radiologist has read your Chest xray   Follow-up: 3 months with Dr. Annamaria Boots ([facility] follow-up)

## 2020-10-17 NOTE — Progress Notes (Signed)
$'@Patient'W$  ID: [name] [name], male    DOB: 02/26/1955, 65 y.o.   MRN: [id number]  Chief Complaint  Patient presents with   Follow-up    Patient reports he has some dry cough since he had covid in July.     Referring provider: [name], MD  HPI: 65 year old male, former smoker quit 2006 (45 pack year hx). PMH significant for OSA, seasonal allergies, diastolic CHF, hypertension, type 2 diabetes, hepatic cirrhosis, pancreatitis, PVD, HLD, obesity. Patient of Dr. Annamaria Boots, last seen on 04/19/20.   Previous [facility] pulmonary encounter: 04/19/20- Dr. Annamaria Boots   male former smoker followed for OSA, chronic bronchitis, SAR, CAD/MI/CABG/ CHF, HBP, DM, cirrhosis liver, Continues oxygen 2 L for sleep/ [facility]. PFT 06/18/2011-moderate restriction. Flows are normal for measure volume with FEV1/FVC 0.84 and insignificant response to bronchodilator. DLCO 52%. HST-08/05/2016-AHI 37.5/hour, desaturation to 67%, body weight 294 pounds HST 09/08/18- AHI 32.4/ hr, desaturation to 73%- avg 89%, body weight 298 lbs -------------------------------------------------------------------------------   04/19/20- 65 year old male former smoker followed for OSA, chronic bronchitis, SAR, CAD/MI/CABG, dCHF, DM2, PVD, hepatic cirrhosis, Morbid Obesity,  HST 09/08/18- AHI 32.4/ hr, desaturation to 73%- avg 89%, body weight 298 lbs CPAP auto 5-20/ [facility]  Ordered 11/01/2018 Download- compliance 0%, AHI 2.1/ hr Body weight today- Covid vax-2 phizer                          Wife here Flu vax-no -----Has been doing ok with his cpap.  He feels that his cpap needs to be adjusted. He stated that he had to remove the mask at times due to the air flow felt so strong.  He stated that he moved and he has not used it for about 2-3 weeks. Last download available is from January. Discussed compliance and goals. Asks refill hycodan cough syrup for "occasional cough only if catches cold". Has been constipated and having heartburn. Explained  his cough syrup will be constipating No specific cardiac or pulmonary events since last here. Does have chronic peripheral edema CXR 07/03/19-  IMPRESSION: 1. No active cardiopulmonary disease. 2. Chronic interstitial lung changes.  // need CXR for cough if persistent//   10/17/2020- Interim hx  Patient presents today for acute office visit. Hx severe sleep apnea and chronic bronchitis. He called our office back on 10/04/20 with complaints of cough. He had covid in July and was treated with paxlovid. He had previously been using mucinex for congestion and recently requested a refill of his hycodan and tessalon for cough. He was advised by Dr. Annamaria Boots to come in for a chest xray. He is accompanied by male friend for his visit today. He is doing well, continues to have a "nagging cough" and chest congestion. Cough is rarely productive. He is not currently taking anything for cough. He has no significant shortness of breath symptoms. He is in the process of moving and CPAP has been in storage sinced February 2022. CXR in 2021 showed chronic interstitial changes.    Allergies  Allergen Reactions   Empagliflozin Rash    Patient reports yeast.    Ibuprofen Other (See Comments)    Stomach pain    Immunization History  Administered Date(s) Administered   Influenza Split 11/11/2010, 10/23/2011, 11/10/2012   Influenza,inj,Quad PF,6+ Mos 11/10/2013, 12/10/2016   Influenza-Unspecified 11/30/2015   PFIZER(Purple Top)SARS-COV-2 Vaccination 05/17/2019, 06/06/2019   Pneumococcal Polysaccharide-23 06/26/2008   Tdap 09/01/2019    Past Medical History:  Diagnosis Date  Acute myocardial infarction, unspecified site, episode of care unspecified 2005   Acute pancreatitis    CAD (coronary artery disease)    a. CABG in 2005 w LIMA to LAD, left radial to second circumflex marginal, saphenous vein graft to PDA, saphenous vein graft to lateral subbrach of ramus intermediate, and sequential saphenous vein  graft to the medial subbranch of ramus intermediate.   Chronic diastolic CHF (congestive heart failure) (HCC)    Cirrhosis of liver without mention of alcohol    CKD (chronic kidney disease), stage II    Complications affecting other specified body systems, hypertension    Ectopic atrial rhythm    Essential hypertension    Hyperlipidemia    Hypokalemia    a. intermitttent noncompliance with potassium supplement.   Hypomagnesemia    Morbid obesity (HCC)    Neuropathy    Other and unspecified hyperlipidemia    Proteinuria    RBBB    Sleep apnea    Type II or unspecified type diabetes mellitus without mention of complication, not stated as uncontrolled    Varicose veins of both lower extremities     Tobacco History: Social History   Tobacco Use  Smoking Status Former   Packs/day: 1.50   Years: 30.00   Pack years: 45.00   Types: Cigarettes   Quit date: 10/20/2004   Years since quitting: 16.0  Smokeless Tobacco Never   Counseling given: Not Answered   Outpatient Medications Prior to Visit  Medication Sig Dispense Refill   albuterol (PROVENTIL) (2.5 MG/3ML) 0.083% nebulizer solution Take 3 mLs (2.5 mg total) by nebulization every 6 (six) hours as needed for wheezing or shortness of breath. 75 mL prn   aspirin EC 81 MG tablet Take 81 mg by mouth daily.     B-D ULTRAFINE III SHORT PEN 31G X 8 MM MISC   1   collagenase (SANTYL) ointment Apply 1 application topically daily. Right ankle measures 6.0 x 0.3cm. 30 g 5   diclofenac Sodium (VOLTAREN) 1 % GEL APPLY 2 GRAMS TOPICALLY 4 TIMES DAILY TO KNEE 100 g 2   eplerenone (INSPRA) 25 MG tablet TAKE 1 TABLET(25 MG) BY MOUTH DAILY 90 tablet 2   famotidine (PEPCID) 20 MG tablet Take 1 tablet (20 mg total) by mouth daily. 90 tablet 3   fluticasone (FLONASE) 50 MCG/ACT nasal spray Place 1 spray into both nostrils daily. 16 g 12   HUMULIN R U-500 KWIKPEN 500 UNIT/ML kwikpen Inject 50-60 Units into the skin See admin instructions. 60 units  in the AM, 50 units at lunch and 50 in the evening 3 mL 0   Lancets (ACCU-CHEK MULTICLIX) lancets      LANTUS SOLOSTAR 100 UNIT/ML Solostar Pen SMARTSIG:55 Unit(s) Topical Every Morning     losartan (COZAAR) 50 MG tablet TAKE 1/2 TABLET BY MOUTH DAILY 45 tablet 1   MAGNESIUM-OXIDE 400 (241.3 Mg) MG tablet Take 1 tablet by mouth 2 (two) times daily.     metoprolol tartrate (LOPRESSOR) 50 MG tablet TAKE 1 TABLET(50 MG) BY MOUTH TWICE DAILY 180 tablet 0   nitroGLYCERIN (NITROSTAT) 0.4 MG SL tablet Place 1 tablet (0.4 mg total) under the tongue every 5 (five) minutes as needed for chest pain. 25 tablet 4   nystatin ointment (MYCOSTATIN) Apply 1 application topically 2 (two) times daily. 30 g 0   ONETOUCH ULTRA test strip USE 8 TIMES DAILY TO CHECK BLOOD SUGAR 800 strip 0   polyethylene glycol (MIRALAX / GLYCOLAX) 17 g packet Take 17  g by mouth daily. 14 each 0   potassium chloride SA (KLOR-CON) 20 MEQ tablet TAKE 2 TABLETS(40 MEQ) BY MOUTH THREE TIMES DAILY 540 tablet 0   rosuvastatin (CRESTOR) 20 MG tablet Take 1 tablet (20 mg total) by mouth every evening. 30 tablet 11   silver sulfADIAZINE (SILVADENE) 1 % cream Apply pea-sized amount to wound daily. 50 g 0   torsemide (DEMADEX) 20 MG tablet TAKE 1 TABLET(20 MG) BY MOUTH TWICE DAILY 180 tablet 0   albuterol (PROVENTIL HFA;VENTOLIN HFA) 108 (90 Base) MCG/ACT inhaler Inhale 2 puffs into the lungs every 6 (six) hours as needed for wheezing or shortness of breath. 1 Inhaler 12   HYDROcodone-homatropine (HYCODAN) 5-1.5 MG/5ML syrup Take 5 mLs by mouth every 8 (eight) hours as needed for cough. 200 mL 0   promethazine-codeine (PHENERGAN WITH CODEINE) 6.25-10 MG/5ML syrup Take 5 mLs by mouth every 6 (six) hours as needed for cough. 200 mL 0   benzonatate (TESSALON) 100 MG capsule Take 1 capsule (100 mg total) by mouth every 8 (eight) hours. (Patient not taking: Reported on 10/17/2020) 21 capsule 0   HUMALOG KWIKPEN 200 UNIT/ML KwikPen Inject 200 mLs into the  skin.     No facility-administered medications prior to visit.   Review of Systems  Review of Systems  Constitutional: Negative.   HENT:  Positive for congestion.   Respiratory:  Positive for cough. Negative for chest tightness, shortness of breath and wheezing.     Physical Exam  BP 120/70 (BP Location: Left Arm, Patient Position: Sitting, Cuff Size: Large)   Pulse 77   Temp 98.2 F (36.8 C) (Oral)   Ht '5\' 10"'$  (1.778 m)   Wt 296 lb 12.8 oz (134.6 kg)   SpO2 93%   BMI 42.59 kg/m  Physical Exam Constitutional:      Appearance: Normal appearance. He is obese.  HENT:     Head: Normocephalic and atraumatic.  Cardiovascular:     Rate and Rhythm: Normal rate and regular rhythm.     Comments: Chronic LE edema  Pulmonary:     Effort: Pulmonary effort is normal.     Breath sounds: No wheezing, rhonchi or rales.     Comments: CTA; no obvious rales, rhonchi or wheezing Musculoskeletal:        General: Normal range of motion.     Cervical back: Normal range of motion and neck supple.  Skin:    General: Skin is warm and dry.     Comments: Clubbing to nails   Neurological:     General: No focal deficit present.     Mental Status: He is alert and oriented to person, place, and time. Mental status is at baseline.  Psychiatric:        Mood and Affect: Mood normal.        Behavior: Behavior normal.        Thought Content: Thought content normal.        Judgment: Judgment normal.     Lab Results:  CBC    Component Value Date/Time   WBC 5.0 09/02/2020 0309   RBC 4.46 09/02/2020 0309   HGB 14.8 09/02/2020 0309   HCT 43.9 09/02/2020 0309   PLT 169 09/02/2020 0309   MCV 98.4 09/02/2020 0309   MCH 33.2 09/02/2020 0309   MCHC 33.7 09/02/2020 0309   RDW 13.5 09/02/2020 0309   LYMPHSABS 3.6 09/01/2019 2150   MONOABS 0.9 09/01/2019 2150   EOSABS 0.2 09/01/2019 2150   BASOSABS  0.1 09/01/2019 2150    BMET    Component Value Date/Time   NA 133 (L) 09/02/2020 0309   NA 138  12/12/2019 1212   K 3.6 09/02/2020 0309   CL 95 (L) 09/02/2020 0309   CO2 29 09/02/2020 0309   GLUCOSE 318 (H) 09/02/2020 0309   BUN 20 09/02/2020 0309   BUN 16 12/12/2019 1212   CREATININE 1.31 (H) 09/02/2020 0309   CREATININE 1.01 03/20/2017 1514   CALCIUM 8.8 (L) 09/02/2020 0309   GFRNONAA >60 09/02/2020 0309   GFRAA 65 12/12/2019 1212    BNP    Component Value Date/Time   BNP 30.3 03/24/2016 1519    ProBNP    Component Value Date/Time   PROBNP 367 06/03/2019 1245   PROBNP 80.0 10/07/2018 1447    Imaging: No results found.   Assessment & Plan:   Obstructive sleep apnea - Hx severe sleep apnea, he has not used CPAP in >6 months (currently in storage)   - Advised patient to resume use - FU in 3 months with Dr. Annamaria Boots   Cough - Hx chronic bronchitis. CXR in 2021 showed interstitial changes. He had covid in July 2022 treated with paxlovid. Continues to have persistent np cough.  We will try to keep him of hycodan cough syrup. Recommend he try OTC Delsym, continue mucinex '600mg'$  BID for congestion and take tesslon perles '200mg'$  TID as needed for cough. Obtained CXR today, results pending. Consider HRCT chest if cough persists.      Martyn Ehrich, NP 10/17/2020

## 2020-10-17 NOTE — Assessment & Plan Note (Addendum)
-   Hx severe sleep apnea, he has not used CPAP in >6 months (currently in storage)   - Advised patient to resume use - FU in 3 months with Dr. Annamaria Boots

## 2020-10-18 ENCOUNTER — Telehealth: Payer: Self-pay | Admitting: Primary Care

## 2020-10-18 DIAGNOSIS — R059 Cough, unspecified: Secondary | ICD-10-CM

## 2020-10-18 MED ORDER — LEVOFLOXACIN 500 MG PO TABS: 500.0000 mg | ORAL_TABLET | Freq: Every day | ORAL | 0 refills | Status: DC

## 2020-10-18 NOTE — Telephone Encounter (Signed)
Please let patient know CXR showed an opacity which may represent an atypical infection. I will send in antibiotic called Levaquin. Needs repeat chest xray in 6 weeks.

## 2020-10-18 NOTE — Telephone Encounter (Signed)
I called and spoke with patient regarding CXR results and Beth recs. Patient verbalized understanding and will come back in 6 weeks for another CXR. I have put in order. Nothing further needed.

## 2020-11-02 DIAGNOSIS — K7460 Unspecified cirrhosis of liver: Secondary | ICD-10-CM | POA: Diagnosis not present

## 2020-11-02 DIAGNOSIS — Z23 Encounter for immunization: Secondary | ICD-10-CM | POA: Diagnosis not present

## 2020-11-02 DIAGNOSIS — E1165 Type 2 diabetes mellitus with hyperglycemia: Secondary | ICD-10-CM | POA: Diagnosis not present

## 2020-11-02 DIAGNOSIS — Z8719 Personal history of other diseases of the digestive system: Secondary | ICD-10-CM | POA: Diagnosis not present

## 2020-11-02 DIAGNOSIS — Z794 Long term (current) use of insulin: Secondary | ICD-10-CM | POA: Diagnosis not present

## 2020-11-02 DIAGNOSIS — E1142 Type 2 diabetes mellitus with diabetic polyneuropathy: Secondary | ICD-10-CM | POA: Diagnosis not present

## 2020-11-05 DIAGNOSIS — J45998 Other asthma: Secondary | ICD-10-CM | POA: Diagnosis not present

## 2020-11-05 NOTE — Progress Notes (Signed)
Cardiology Office Note:    Date:  11/06/2020   ID:  [name], DOB [birth year], MRN [id number]  PCP:  [name], MD   [facility] Providers Cardiologist:  Candee Furbish, MD     Referring MD: [name], MD   Chief Complaint:  F/u for CAD, CHF    Patient Profile:   [name] [name] is a 65 y.o. male with:  Coronary artery disease  Hx of MI s/p CABG in 2005 Myoview 10/15: low risk  (HFpEF) heart failure with preserved ejection fraction  Chronic kidney disease  Diabetes mellitus  Hypertension  Hyperlipidemia  Ectopic atrial tachycardia Morbid obesity  Venous insufficiency  Hx of pancreatitis  RBBB  Cirrhosis  Hx of ETOH abuse  Peripheral arterial disease (followed by [facility]) OSA  Neuropathy   Prior CV studies: ECHOCARDIOGRAM 05/07/17 EF 50-55, mild MR, mild RVE, moderate RAE  ABIs 08/06/2018 R 0.85; L 0.88  MYOVIEW 11/16/13 No ischemia, EF not gated; low risk   History of Present Illness: Mr. Doten was last seen by Dr. Marlou Porch in 11/21.  He returns for f/u.  He is here with his friend, Rollene Fare.  He was recently treated for COVID-19 in July.  He had a persistent cough and saw pulmonology earlier this month and was placed on antibiotics for atypical infection.  His cough is improved since that time.  He has not had chest pain, significant shortness of breath.  He sleeps on an angle chronically.  He has had some increased lower extremity edema.  His right leg is greater than his left.  This is somewhat unusual.  His weight did go down while he was sick but has returned to baseline.  He has not had syncope.        Past Medical History:  Diagnosis Date   Acute myocardial infarction, unspecified site, episode of care unspecified 2005   Acute pancreatitis    CAD (coronary artery disease)    a. CABG in 2005 w LIMA to LAD, left radial to second circumflex marginal, saphenous vein graft to PDA, saphenous vein graft to lateral subbrach of ramus intermediate, and  sequential saphenous vein graft to the medial subbranch of ramus intermediate.   Chronic diastolic CHF (congestive heart failure) (HCC)    Cirrhosis of liver without mention of alcohol    CKD (chronic kidney disease), stage II    Complications affecting other specified body systems, hypertension    Ectopic atrial rhythm    Essential hypertension    Hyperlipidemia    Hypokalemia    a. intermitttent noncompliance with potassium supplement.   Hypomagnesemia    Morbid obesity (HCC)    Neuropathy    Other and unspecified hyperlipidemia    Proteinuria    RBBB    Sleep apnea    Type II or unspecified type diabetes mellitus without mention of complication, not stated as uncontrolled    Varicose veins of both lower extremities    Current Medications: Current Meds  Medication Sig   albuterol (PROVENTIL) (2.5 MG/3ML) 0.083% nebulizer solution Take 3 mLs (2.5 mg total) by nebulization every 6 (six) hours as needed for wheezing or shortness of breath.   albuterol (VENTOLIN HFA) 108 (90 Base) MCG/ACT inhaler Inhale 2 puffs into the lungs every 6 (six) hours as needed for wheezing or shortness of breath.   aspirin EC 81 MG tablet Take 81 mg by mouth daily.   B-D ULTRAFINE III SHORT PEN 31G X 8 MM MISC  benzonatate (TESSALON) 100 MG capsule Take 2 capsules (200 mg total) by mouth 3 (three) times daily.   collagenase (SANTYL) ointment Apply 1 application topically daily. Right ankle measures 6.0 x 0.3cm.   diclofenac Sodium (VOLTAREN) 1 % GEL APPLY 2 GRAMS TOPICALLY 4 TIMES DAILY TO KNEE   eplerenone (INSPRA) 25 MG tablet TAKE 1 TABLET(25 MG) BY MOUTH DAILY   famotidine (PEPCID) 20 MG tablet Take 1 tablet (20 mg total) by mouth daily.   fluticasone (FLONASE) 50 MCG/ACT nasal spray Place 1 spray into both nostrils daily.   HUMULIN R U-500 KWIKPEN 500 UNIT/ML kwikpen Inject 50-60 Units into the skin See admin instructions. 60 units in the AM, 50 units at lunch and 50 in the evening   Lancets  (ACCU-CHEK MULTICLIX) lancets    LANTUS SOLOSTAR 100 UNIT/ML Solostar Pen Inject 55 Units into the skin every morning.   levofloxacin (LEVAQUIN) 500 MG tablet Take 1 tablet (500 mg total) by mouth daily.   MAGNESIUM-OXIDE 400 (241.3 Mg) MG tablet Take 1 tablet by mouth 2 (two) times daily.   metoprolol tartrate (LOPRESSOR) 50 MG tablet TAKE 1 TABLET(50 MG) BY MOUTH TWICE DAILY   nystatin ointment (MYCOSTATIN) Apply 1 application topically 2 (two) times daily.   ONETOUCH ULTRA test strip USE 8 TIMES DAILY TO CHECK BLOOD SUGAR   polyethylene glycol (MIRALAX / GLYCOLAX) 17 g packet Take 17 g by mouth daily.   potassium chloride SA (KLOR-CON) 20 MEQ tablet TAKE 2 TABLETS(40 MEQ) BY MOUTH THREE TIMES DAILY   rosuvastatin (CRESTOR) 20 MG tablet Take 1 tablet (20 mg total) by mouth every evening.   silver sulfADIAZINE (SILVADENE) 1 % cream Apply pea-sized amount to wound daily.   simvastatin (ZOCOR) 80 MG tablet Take 1/2 tablet BY MOUTH ( 40 mg total) one time daily   torsemide (DEMADEX) 20 MG tablet TAKE 1 TABLET(20 MG) BY MOUTH TWICE DAILY   [DISCONTINUED] losartan (COZAAR) 50 MG tablet TAKE 1/2 TABLET BY MOUTH DAILY   [DISCONTINUED] nitroGLYCERIN (NITROSTAT) 0.4 MG SL tablet Place 1 tablet (0.4 mg total) under the tongue every 5 (five) minutes as needed for chest pain.    Allergies:   Empagliflozin and Ibuprofen   Social History   Tobacco Use   Smoking status: Former    Packs/day: 1.50    Years: 30.00    Pack years: 45.00    Types: Cigarettes    Quit date: 10/20/2004    Years since quitting: 16.0   Smokeless tobacco: Never  Vaping Use   Vaping Use: Never used  Substance Use Topics   Alcohol use: Yes    Comment: occasional   Drug use: No    Family Hx: The patient's family history includes Breast cancer in his mother; Coronary artery disease in his father; Prostate cancer in his father.  Review of Systems  Gastrointestinal:  Negative for hematochezia and melena.  Genitourinary:   Negative for hematuria.    EKGs/Labs/Other Test Reviewed:    EKG:  EKG is  ordered today.  The ekg ordered today demonstrates NSR, HR 71, inferior Q waves, right bundle branch block, nonspecific ST-T wave changes, QTC 456, LVH  Recent Labs: 12/12/2019: Magnesium 1.6 09/02/2020: BUN 20; Creatinine, Ser 1.31; Hemoglobin 14.8; Platelets 169; Potassium 3.6; Sodium 133   Recent Lipid Panel Lab Results  Component Value Date/Time   CHOL 126 12/12/2019 12:12 PM   TRIG 132 12/12/2019 12:12 PM   HDL 47 12/12/2019 12:12 PM   LDLCALC 56 12/12/2019 12:12 PM  LDLDIRECT 71.0 10/07/2018 02:47 PM     Risk Assessment/Calculations:          Physical Exam:    VS:  BP (!) 100/58   Pulse 71   Ht 5\' 10"  (1.778 m)   Wt (!) 303 lb (137.4 kg)   SpO2 94%   BMI 43.48 kg/m     Wt Readings from Last 3 Encounters:  11/06/20 (!) 303 lb (137.4 kg)  10/17/20 296 lb 12.8 oz (134.6 kg)  09/02/20 297 lb (134.7 kg)    Constitutional:      Appearance: Healthy appearance. Not in distress.  Neck:     Vascular: JVD normal.  Pulmonary:     Effort: Pulmonary effort is normal.     Breath sounds: No wheezing. No rales.  Cardiovascular:     Normal rate. Regular rhythm. Distant heart sounds     Murmurs: There is no murmur.  Edema:    Peripheral edema present.    Pretibial: trace edema of the left pretibial area and 1+ edema of the right pretibial area.    Ankle: 1+ edema of the right ankle. Abdominal:     General: There is distension.     Palpations: Abdomen is soft.  Skin:    General: Skin is warm and dry.  Neurological:     General: No focal deficit present.     Mental Status: Alert and oriented to person, place and time.     Cranial Nerves: Cranial nerves are intact.       ASSESSMENT & PLAN:   1. Coronary artery disease of bypass graft of native heart with stable angina pectoris ([place]) History of myocardial infarction followed by CABG in 2005.  Myoview in 2015 was low risk.  He is doing well  without anginal symptoms.  Continue aspirin 81 mg daily, simvastatin 40 mg daily, metoprolol tartrate 50 mg twice daily.  Follow-up in 6 months.  2. Chronic heart failure with preserved ejection fraction (HCC) EF 50-55 by echocardiogram in 3/19.  NYHA IIb.  Volume status overall appears to be stable.  He does have some increased lower extremity swelling.  His weight did go down when he had COVID and has now bounced back to his baseline.  His lungs are clear and his neck veins are flat.  I have asked him to take 1 extra dose of torsemide 20 mg today.  Otherwise continue torsemide 40 mg daily, eplerenone 25 mg daily.  Unfortunately, he cannot take SGLT2 inhibitors due to prior yeast infection.    3. Essential hypertension Blood pressure is well controlled.  Continue eplerenone 25 mg daily, losartan 25 mg daily, metoprolol tartrate 50 mg twice daily.  4. Mixed hyperlipidemia Recent LDL 75.  He notes he was off of his medication for a few days.  Continue simvastatin 40 mg daily.  If his LDL remains above 70 on future lab draws, consider adding ezetimibe.  5. PVD (peripheral vascular disease) ([place]) He has stable left leg claudication.  6. Type 2 diabetes mellitus with other specified complication, with long-term current use of insulin ([place]) As noted, he cannot take SGLT2 inhibitors due to prior yeast infection.  His diabetes is managed by endocrinology, Dr. Buddy Duty.  7. Right leg swelling It is unusual for him to have worsening swelling in his right leg compared to his left.  Given his recent illness he was likely not as active.  I will arrange right lower extremity venous Doppler to rule out DVT.   Dispo:  Return  in about 6 months (around 05/06/2021) for Routine Follow Up with Dr. Marlou Porch.   Medication Adjustments/Labs and Tests Ordered: Current medicines are reviewed at length with the patient today.  Concerns regarding medicines are outlined above.  Tests Ordered: Orders Placed This Encounter   Procedures   EKG 12-Lead   VAS [place] LOWER EXTREMITY VENOUS (DVT)   Medication Changes: Meds ordered this encounter  Medications   losartan (COZAAR) 50 MG tablet    Sig: Take 0.5 tablets (25 mg total) by mouth daily.    Dispense:  45 tablet    Refill:  3   nitroGLYCERIN (NITROSTAT) 0.4 MG SL tablet    Sig: Place 1 tablet (0.4 mg total) under the tongue every 5 (five) minutes as needed for chest pain.    Dispense:  25 tablet    Refill:  4    Signed, [name], PA-C  11/06/2020 10:02 AM    [address], [place], [place]  [address] Phone: [phone]; Fax: [phone]

## 2020-11-06 ENCOUNTER — Ambulatory Visit (INDEPENDENT_AMBULATORY_CARE_PROVIDER_SITE_OTHER): Payer: Medicare Other | Admitting: Physician Assistant

## 2020-11-06 ENCOUNTER — Encounter: Payer: Self-pay | Admitting: Physician Assistant

## 2020-11-06 ENCOUNTER — Other Ambulatory Visit: Payer: Self-pay

## 2020-11-06 VITALS — BP 100/58 | HR 71 | Ht 70.0 in | Wt 303.0 lb

## 2020-11-06 DIAGNOSIS — I5032 Chronic diastolic (congestive) heart failure: Secondary | ICD-10-CM | POA: Diagnosis not present

## 2020-11-06 DIAGNOSIS — M7989 Other specified soft tissue disorders: Secondary | ICD-10-CM

## 2020-11-06 DIAGNOSIS — E782 Mixed hyperlipidemia: Secondary | ICD-10-CM | POA: Diagnosis not present

## 2020-11-06 DIAGNOSIS — R600 Localized edema: Secondary | ICD-10-CM | POA: Diagnosis not present

## 2020-11-06 DIAGNOSIS — I25708 Atherosclerosis of coronary artery bypass graft(s), unspecified, with other forms of angina pectoris: Secondary | ICD-10-CM

## 2020-11-06 DIAGNOSIS — Z794 Long term (current) use of insulin: Secondary | ICD-10-CM

## 2020-11-06 DIAGNOSIS — E1169 Type 2 diabetes mellitus with other specified complication: Secondary | ICD-10-CM

## 2020-11-06 DIAGNOSIS — J45998 Other asthma: Secondary | ICD-10-CM | POA: Diagnosis not present

## 2020-11-06 DIAGNOSIS — I491 Atrial premature depolarization: Secondary | ICD-10-CM

## 2020-11-06 DIAGNOSIS — I739 Peripheral vascular disease, unspecified: Secondary | ICD-10-CM

## 2020-11-06 DIAGNOSIS — K7030 Alcoholic cirrhosis of liver without ascites: Secondary | ICD-10-CM

## 2020-11-06 DIAGNOSIS — G4733 Obstructive sleep apnea (adult) (pediatric): Secondary | ICD-10-CM

## 2020-11-06 DIAGNOSIS — I10 Essential (primary) hypertension: Secondary | ICD-10-CM | POA: Diagnosis not present

## 2020-11-06 MED ORDER — NITROGLYCERIN 0.4 MG SL SUBL: 0.4000 mg | SUBLINGUAL_TABLET | SUBLINGUAL | 4 refills | Status: DC | PRN

## 2020-11-06 MED ORDER — LOSARTAN POTASSIUM 50 MG PO TABS
25.0000 mg | ORAL_TABLET | Freq: Every day | ORAL | 3 refills | Status: DC
Start: 2020-11-06 — End: 2021-10-15

## 2020-11-06 NOTE — Patient Instructions (Addendum)
Medication Instructions:  Your physician recommends that you continue on your current medications as directed, but take 1 extra Torsemide today only.  *If you need a refill on your cardiac medications before your next appointment, please call your pharmacy*   Lab Work: None ordered  If you have labs (blood work) drawn today and your tests are completely normal, you will receive your results only by: [place] (if you have MyChart) OR A paper copy in the mail If you have any lab test that is abnormal or we need to change your treatment, we will call you to review the results.   Testing/Procedures: Your physician has requested that you have a lower or upper extremity venous duplex TODAY OR TOMORROW. This test is an ultrasound of the veins in the legs or arms. It looks at venous blood flow that carries blood from the heart to the legs or arms. Allow one hour for a Lower Venous exam. Allow thirty minutes for an Upper Venous exam. There are no restrictions or special instructions.     Follow-Up: At [facility], you and your health needs are our priority.  As part of our continuing mission to provide you with exceptional heart care, we have created designated Provider Care Teams.  These Care Teams include your primary Cardiologist (physician) and Advanced Practice Providers (APPs -  Physician Assistants and Nurse Practitioners) who all work together to provide you with the care you need, when you need it.  We recommend signing up for the patient portal called "MyChart".  Sign up information is provided on this After Visit Summary.  MyChart is used to connect with patients for Virtual Visits (Telemedicine).  Patients are able to view lab/test results, encounter notes, upcoming appointments, etc.  Non-urgent messages can be sent to your provider as well.   To learn more about what you can do with MyChart, go to [email or web address].    Your next appointment:   6 month(s)  The format  for your next appointment:   In Person  Provider:   You may see Candee Furbish, MD or one of the following Advanced Practice Providers on your designated Care Team:   Cecilie Kicks, NP    Other Instructions

## 2020-11-07 ENCOUNTER — Other Ambulatory Visit: Payer: Self-pay | Admitting: Internal Medicine

## 2020-11-07 ENCOUNTER — Ambulatory Visit (HOSPITAL_COMMUNITY)
Admission: RE | Admit: 2020-11-07 | Discharge: 2020-11-07 | Disposition: A | Discharge status: Home / Self Care | Payer: Medicare Other | Source: Ambulatory Visit | Attending: Cardiology | Admitting: Cardiology

## 2020-11-07 DIAGNOSIS — M7989 Other specified soft tissue disorders: Secondary | ICD-10-CM | POA: Insufficient documentation

## 2020-11-07 DIAGNOSIS — R600 Localized edema: Secondary | ICD-10-CM | POA: Diagnosis not present

## 2020-11-07 MED ORDER — IPRATROPIUM-ALBUTEROL 0.5-2.5 (3) MG/3ML IN SOLN: RESPIRATORY_TRACT | 12 refills | Status: DC

## 2020-11-12 ENCOUNTER — Other Ambulatory Visit: Payer: Self-pay | Admitting: Cardiology

## 2020-11-13 ENCOUNTER — Telehealth: Payer: Self-pay | Admitting: Internal Medicine

## 2020-11-13 DIAGNOSIS — I739 Peripheral vascular disease, unspecified: Secondary | ICD-10-CM | POA: Diagnosis not present

## 2020-11-13 DIAGNOSIS — L84 Corns and callosities: Secondary | ICD-10-CM | POA: Diagnosis not present

## 2020-11-13 DIAGNOSIS — L603 Nail dystrophy: Secondary | ICD-10-CM | POA: Diagnosis not present

## 2020-11-13 DIAGNOSIS — E1151 Type 2 diabetes mellitus with diabetic peripheral angiopathy without gangrene: Secondary | ICD-10-CM | POA: Diagnosis not present

## 2020-11-13 MED ORDER — IPRATROPIUM-ALBUTEROL 0.5-2.5 (3) MG/3ML IN SOLN: RESPIRATORY_TRACT | 12 refills | Status: DC

## 2020-11-13 NOTE — Telephone Encounter (Signed)
[name] [name], [facility] mail order pharmacy requested Duo neb prescription for 30 vials.  New prescription sent to [facility].  Nothing further at this time.

## 2020-11-14 DIAGNOSIS — J40 Bronchitis, not specified as acute or chronic: Secondary | ICD-10-CM | POA: Diagnosis not present

## 2020-11-15 ENCOUNTER — Ambulatory Visit (INDEPENDENT_AMBULATORY_CARE_PROVIDER_SITE_OTHER): Payer: Medicare Other

## 2020-11-15 ENCOUNTER — Encounter: Payer: Self-pay | Admitting: Internal Medicine

## 2020-11-15 ENCOUNTER — Other Ambulatory Visit: Payer: Self-pay | Admitting: Cardiology

## 2020-11-15 DIAGNOSIS — Z1211 Encounter for screening for malignant neoplasm of colon: Secondary | ICD-10-CM

## 2020-11-15 DIAGNOSIS — Z0000 Encounter for general adult medical examination without abnormal findings: Secondary | ICD-10-CM

## 2020-11-15 NOTE — Progress Notes (Addendum)
Virtual Visit via Telephone Note  I connected with  [name] [name] on 11/15/20 at  9:30 AM EDT by telephone and verified that I am speaking with the correct person using two identifiers.  Medicare Annual Wellness visit completed telephonically due to Covid-19 pandemic.   Persons participating in this call: This Health Coach and this patient.   Location: Patient: Home Provider: Office   I discussed the limitations, risks, security and privacy concerns of performing an evaluation and management service by telephone and the availability of in person appointments. The patient expressed understanding and agreed to proceed.  Unable to perform video visit due to video visit attempted and failed and/or patient does not have video capability.   Some vital signs may be absent or patient reported.   [name], LPN   Subjective:   [name] [name] is a 65 y.o. male who presents for Medicare Annual/Subsequent preventive examination.  Review of Systems     Cardiac Risk Factors include: advanced age (>52men, >26 women);hypertension;diabetes mellitus;dyslipidemia;male gender;obesity (BMI >30kg/m2)     Objective:    Today's Vitals   11/15/20 0934  PainSc: 5    There is no height or weight on file to calculate BMI.  Advanced Directives 11/15/2020 09/02/2020 11/10/2019 09/02/2019 09/01/2019 10/13/2018 04/13/2017  Does Patient Have a Medical Advance Directive? No No No No No No No  Would patient like information on creating a medical advance directive? No - Patient declined - No - Patient declined No - Patient declined - Yes (MAU/Ambulatory/Procedural Areas - Information given) -    Current Medications (verified) Outpatient Encounter Medications as of 11/15/2020  Medication Sig   albuterol (VENTOLIN HFA) 108 (90 Base) MCG/ACT inhaler Inhale 2 puffs into the lungs every 6 (six) hours as needed for wheezing or shortness of breath.   aspirin EC 81 MG tablet Take 81 mg by mouth daily.   B-D  ULTRAFINE III SHORT PEN 31G X 8 MM MISC    collagenase (SANTYL) ointment Apply 1 application topically daily. Right ankle measures 6.0 x 0.3cm.   diclofenac Sodium (VOLTAREN) 1 % GEL APPLY 2 GRAMS TOPICALLY 4 TIMES DAILY TO KNEE   eplerenone (INSPRA) 25 MG tablet TAKE 1 TABLET(25 MG) BY MOUTH DAILY   famotidine (PEPCID) 20 MG tablet Take 1 tablet (20 mg total) by mouth daily.   fluticasone (FLONASE) 50 MCG/ACT nasal spray Place 1 spray into both nostrils daily.   HUMULIN R U-500 KWIKPEN 500 UNIT/ML kwikpen Inject 50-60 Units into the skin See admin instructions. 60 units in the AM, 50 units at lunch and 50 in the evening   ipratropium-albuterol (DUONEB) 0.5-2.5 (3) MG/3ML SOLN 1 vial in nebulizer up to 4 times daily   Lancets (ACCU-CHEK MULTICLIX) lancets    LANTUS SOLOSTAR 100 UNIT/ML Solostar Pen Inject 55 Units into the skin every morning.   losartan (COZAAR) 50 MG tablet Take 0.5 tablets (25 mg total) by mouth daily.   MAGNESIUM-OXIDE 400 (241.3 Mg) MG tablet Take 1 tablet by mouth 2 (two) times daily.   metoprolol tartrate (LOPRESSOR) 50 MG tablet TAKE 1 TABLET(50 MG) BY MOUTH TWICE DAILY   nitroGLYCERIN (NITROSTAT) 0.4 MG SL tablet Place 1 tablet (0.4 mg total) under the tongue every 5 (five) minutes as needed for chest pain.   nystatin ointment (MYCOSTATIN) Apply 1 application topically 2 (two) times daily.   ONETOUCH ULTRA test strip USE 8 TIMES DAILY TO CHECK BLOOD SUGAR   polyethylene glycol (MIRALAX / GLYCOLAX) 17 g packet Take  17 g by mouth daily.   potassium chloride SA (KLOR-CON) 20 MEQ tablet TAKE 2 TABLETS(40 MEQ) BY MOUTH THREE TIMES DAILY   rosuvastatin (CRESTOR) 20 MG tablet Take 1 tablet (20 mg total) by mouth every evening.   silver sulfADIAZINE (SILVADENE) 1 % cream Apply pea-sized amount to wound daily.   simvastatin (ZOCOR) 80 MG tablet Take 1/2 tablet BY MOUTH ( 40 mg total) one time daily   torsemide (DEMADEX) 20 MG tablet TAKE 1 TABLET(20 MG) BY MOUTH TWICE DAILY    benzonatate (TESSALON) 100 MG capsule Take 2 capsules (200 mg total) by mouth 3 (three) times daily. (Patient not taking: Reported on 11/15/2020)   [DISCONTINUED] levofloxacin (LEVAQUIN) 500 MG tablet Take 1 tablet (500 mg total) by mouth daily. (Patient not taking: Reported on 11/15/2020)   No facility-administered encounter medications on file as of 11/15/2020.    Allergies (verified) Empagliflozin and Ibuprofen   History: Past Medical History:  Diagnosis Date   Acute myocardial infarction, unspecified site, episode of care unspecified 2005   Acute pancreatitis    CAD (coronary artery disease)    a. CABG in 2005 w LIMA to LAD, left radial to second circumflex marginal, saphenous vein graft to PDA, saphenous vein graft to lateral subbrach of ramus intermediate, and sequential saphenous vein graft to the medial subbranch of ramus intermediate.   Chronic diastolic CHF (congestive heart failure) (HCC)    Cirrhosis of liver without mention of alcohol    CKD (chronic kidney disease), stage II    Complications affecting other specified body systems, hypertension    Ectopic atrial rhythm    Essential hypertension    Hyperlipidemia    Hypokalemia    a. intermitttent noncompliance with potassium supplement.   Hypomagnesemia    Morbid obesity (HCC)    Neuropathy    Other and unspecified hyperlipidemia    Proteinuria    RBBB    Sleep apnea    Type II or unspecified type diabetes mellitus without mention of complication, not stated as uncontrolled    Varicose veins of both lower extremities    Past Surgical History:  Procedure Laterality Date   CORONARY ARTERY BYPASS GRAFT     2005   I & D EXTREMITY Left 09/02/2019   Procedure: IRRIGATION AND DEBRIDEMENT 4th TOE DEBRIDIMENT;  Surgeon: Evelina Bucy, DPM;  Location: [facility];  Service: Podiatry;  Laterality: Left;   Family History  Problem Relation Age of Onset   Breast cancer Mother    Prostate cancer Father    Coronary artery disease  Father        underwent CABG   Social History   Socioeconomic History   Marital status: Significant Other    Spouse name: Not on file   Number of children: Not on file   Years of education: Not on file   Highest education level: Not on file  Occupational History   Occupation: Disabled     Comment: previously worked in delivery   Tobacco Use   Smoking status: Former    Packs/day: 1.50    Years: 30.00    Pack years: 45.00    Types: Cigarettes    Quit date: 10/20/2004    Years since quitting: 16.0   Smokeless tobacco: Never  Vaping Use   Vaping Use: Never used  Substance and Sexual Activity   Alcohol use: Yes    Comment: occasional   Drug use: No   Sexual activity: Not on file  Other Topics Concern  Not on file  Social History Narrative   Lives with significant other x 30+ years, apartment "friends smoke". Disability. Is disabled secondary to his work-related injury.       Has over 20 grandchildren    Social Determinants of Health   Financial Resource Strain: Low Risk    Difficulty of Paying Living Expenses: Not hard at all  Food Insecurity: No Food Insecurity   Worried About Charity fundraiser in the Last Year: Never true   Arboriculturist in the Last Year: Never true  Transportation Needs: No Transportation Needs   Lack of Transportation (Medical): No   Lack of Transportation (Non-Medical): No  Physical Activity: Insufficiently Active   Days of Exercise per Week: 5 days   Minutes of Exercise per Session: 10 min  Stress: No Stress Concern Present   Feeling of Stress : Not at all  Social Connections: Socially Isolated   Frequency of Communication with Friends and Family: More than three times a week   Frequency of Social Gatherings with Friends and Family: More than three times a week   Attends Religious Services: Never   Marine scientist or Organizations: No   Attends Music therapist: Never   Marital Status: Never married    Tobacco  Counseling Counseling given: Not Answered   Clinical Intake:  Pre-visit preparation completed: Yes  Pain : 0-10 Pain Score: 5  Pain Type: Chronic pain Pain Location: Leg (and hands) Pain Orientation: Left, Right Pain Descriptors / Indicators: Aching Pain Onset: More than a month ago Pain Frequency: Intermittent     BMI - recorded: 43.48 Nutritional Status: BMI > 30  Obese Nutritional Risks: None Diabetes: Yes CBG done?: No CBG resulted in Enter/ Edit results?: No Did pt. bring in CBG monitor from home?: No  How often do you need to have someone help you when you read instructions, pamphlets, or other written materials from your doctor or pharmacy?: 1 - Never  Diabetic?Nutrition Risk Assessment:  Has the patient had any N/V/D within the last 2 months?  No  Does the patient have any non-healing wounds?  No  Has the patient had any unintentional weight loss or weight gain?  No   Diabetes:  Is the patient diabetic?  Yes  If diabetic, was a CBG obtained today?  No  Did the patient bring in their glucometer from home?  No  How often do you monitor your CBG's? daily.   Financial Strains and Diabetes Management:  Are you having any financial strains with the device, your supplies or your medication? No .  Does the patient want to be seen by Chronic Care Management for management of their diabetes?  No  Would the patient like to be referred to a Nutritionist or for Diabetic Management?  No   Diabetic Exams:  Diabetic eye: 03/13/20 Diabetic Foot Exam: Completed 11/02/20   Interpreter Needed?: No  Information entered by :: Dayton Martes   Activities of Daily Living In your present state of health, do you have any difficulty performing the following activities: 11/15/2020  Hearing? N  Vision? N  Difficulty concentrating or making decisions? N  Walking or climbing stairs? N  Dressing or bathing? N  Doing errands, shopping? N  Preparing Food and eating ? N   Using the Toilet? N  In the past six months, have you accidently leaked urine? N  Do you have problems with loss of bowel control? N  Managing your Medications? N  Managing your Finances? N  Housekeeping or managing your Housekeeping? N  Some recent data might be hidden    Patient Care Team: [name], MD as PCP - General (Family Medicine) [name], MD as PCP - Cardiology (Cardiology) Delrae Rend, MD as Consulting Physician (Endocrinology) Gardiner Barefoot, DPM as Consulting Physician (Podiatry) Deneise Lever, MD as Consulting Physician (Pulmonary Disease) [name], [name], NP (Inactive) as Nurse Practitioner (Vascular Surgery) [facility] as Consulting Physician  Indicate any recent Medical Services you may have received from other than [facility] providers in the past year (date may be approximate).     Assessment:   This is a routine wellness examination for [name] [name].  Hearing/Vision screen Hearing Screening - Comments:: Pt stated slight hearing loss Vision Screening - Comments:: Pt follows up with Dr Katy Fitch for annual eye exams   Dietary issues and exercise activities discussed: Current Exercise Habits: Home exercise routine, Type of exercise: walking, Time (Minutes): 10, Frequency (Times/Week): 5, Weekly Exercise (Minutes/Week): 50   Goals Addressed             This Visit's Progress    Patient Stated       Lose weight and cut back on eating late        Depression Screen PHQ 2/9 Scores 11/15/2020 11/10/2019 04/04/2019 10/05/2018 03/24/2016  PHQ - 2 Score 0 0 0 0 0  PHQ- 9 Score - - - 0 -    Fall Risk Fall Risk  11/15/2020 11/10/2019 04/04/2019 10/13/2018  Falls in the past year? 0 1 1 0  Number falls in past yr: 0 1 0 0  Injury with Fall? 0 0 1 0  Comment - - left knee brusied -  Risk for fall due to : Impaired vision History of fall(s);Impaired balance/gait;Impaired mobility;Impaired vision - Impaired balance/gait  Follow up Falls  prevention discussed Falls prevention discussed - Education provided    FALL RISK PREVENTION PERTAINING TO THE HOME:  Any stairs in or around the home? Yes  If so, are there any without handrails? No  Home free of loose throw rugs in walkways, pet beds, electrical cords, etc? Yes  Adequate lighting in your home to reduce risk of falls? Yes   ASSISTIVE DEVICES UTILIZED TO PREVENT FALLS:  Life alert? Yes On apple watch  Use of a cane, walker or w/c? Yes  Grab bars in the bathroom? Yes  Shower chair or bench in shower? No  Elevated toilet seat or a handicapped toilet? No   TIMED UP AND GO:  Was the test performed? No .  Cognitive Function:     6CIT Screen 11/15/2020 11/10/2019  What Year? 0 points 0 points  What month? 0 points 0 points  What time? 0 points -  Count back from 20 0 points 0 points  Months in reverse 0 points 0 points  Repeat phrase 0 points 2 points  Total Score 0 -    Immunizations Immunization History  Administered Date(s) Administered   Influenza Split 11/11/2010, 10/23/2011, 11/10/2012   Influenza,inj,Quad PF,6+ Mos 11/10/2013, 12/10/2016   Influenza-Unspecified 11/30/2015, 11/01/2020   PFIZER(Purple Top)SARS-COV-2 Vaccination 05/17/2019, 06/06/2019   Pneumococcal Polysaccharide-23 06/26/2008   Tdap 09/01/2019    TDAP status: Up to date  Flu Vaccine status: Up to date  Pneumococcal vaccine status: Due, Education has been provided regarding the importance of this vaccine. Advised may receive this vaccine at local pharmacy or Health Dept. Aware to provide a copy of the vaccination record if  obtained from local pharmacy or Health Dept. Verbalized acceptance and understanding.  Covid-19 vaccine status: Completed vaccines  Qualifies for Shingles Vaccine? Yes   Zostavax completed No   Shingrix Completed?: No.    Education has been provided regarding the importance of this vaccine. Patient has been advised to call insurance company to determine out of  pocket expense if they have not yet received this vaccine. Advised may also receive vaccine at local pharmacy or Health Dept. Verbalized acceptance and understanding.  Screening Tests Health Maintenance  Topic Date Due   Zoster Vaccines- Shingrix (1 of 2) Never done   COLONOSCOPY (Pts 45-10yrs Insurance coverage will need to be confirmed)  Never done   COVID-19 Vaccine (3 - Pfizer risk series) 07/04/2019   HEMOGLOBIN A1C  08/02/2020   OPHTHALMOLOGY EXAM  03/13/2021   FOOT EXAM  11/02/2021   TETANUS/TDAP  08/31/2029   INFLUENZA VACCINE  Completed   Hepatitis C Screening  Completed   HPV VACCINES  Aged Out    Health Maintenance  Health Maintenance Due  Topic Date Due   Zoster Vaccines- Shingrix (1 of 2) Never done   COLONOSCOPY (Pts 45-42yrs Insurance coverage will need to be confirmed)  Never done   COVID-19 Vaccine (3 - Pfizer risk series) 07/04/2019   HEMOGLOBIN A1C  08/02/2020    Colorectal cancer screening: Referral to GI placed 11/15/20. Pt aware the office will call re: appt.   Additional Screening:  Hepatitis C Screening:  Completed 12/05/10  Vision Screening: Recommended annual ophthalmology exams for early detection of glaucoma and other disorders of the eye. Is the patient up to date with their annual eye exam?  Yes  Who is the provider or what is the name of the office in which the patient attends annual eye exams? Dr Katy Fitch If pt is not established with a provider, would they like to be referred to a provider to establish care? No .   Dental Screening: Recommended annual dental exams for proper oral hygiene  Community Resource Referral / Chronic Care Management: CRR required this visit?  No   CCM required this visit?  No      Plan:     I have personally reviewed and noted the following in the patient's chart:   Medical and social history Use of alcohol, tobacco or illicit drugs  Current medications and supplements including opioid prescriptions. Patient  is not currently taking opioid prescriptions. Functional ability and status Nutritional status Physical activity Advanced directives List of other physicians Hospitalizations, surgeries, and ER visits in previous 12 months Vitals Screenings to include cognitive, depression, and falls Referrals and appointments  In addition, I have reviewed and discussed with patient certain preventive protocols, quality metrics, and best practice recommendations. A written personalized care plan for preventive services as well as general preventive health recommendations were provided to patient.     [name], LPN   [id number]   Nurse Notes: None

## 2020-11-15 NOTE — Patient Instructions (Signed)
[name] , Thank you for taking time to come for your Medicare Wellness Visit. I appreciate your ongoing commitment to your health goals. Please review the following plan we discussed and let me know if I can assist you in the future.   Screening recommendations/referrals: Colonoscopy: order placed 11/15/20 Recommended yearly ophthalmology/optometry visit for glaucoma screening and checkup Recommended yearly dental visit for hygiene and checkup  Vaccinations: Influenza vaccine: Completed 11/01/20 Pneumococcal vaccine: Due Tdap vaccine: Done 09/01/19 repeat every 10 years  Shingles vaccine: Shingrix discussed. Please contact your pharmacy for coverage information.    Covid-19: Completed 4/6 & 06/05/20  Advanced directives: Advance directive discussed with you today. Even though you declined this today please call our office should you change your mind and we can give you the proper paperwork for you to fill out.  Conditions/risks identified: lose weight and cut back on eating late   Next appointment: Follow up in one year for your annual wellness visit.   Preventive Care 75 Years and Older, Male Preventive care refers to lifestyle choices and visits with your health care provider that can promote health and wellness. What does preventive care include? A yearly physical exam. This is also called an annual well check. Dental exams once or twice a year. Routine eye exams. Ask your health care provider how often you should have your eyes checked. Personal lifestyle choices, including: Daily care of your teeth and gums. Regular physical activity. Eating a healthy diet. Avoiding tobacco and drug use. Limiting alcohol use. Practicing safe sex. Taking low doses of aspirin every day. Taking vitamin and mineral supplements as recommended by your health care provider. What happens during an annual well check? The services and screenings done by your health care provider during your annual well  check will depend on your age, overall health, lifestyle risk factors, and family history of disease. Counseling  Your health care provider may ask you questions about your: Alcohol use. Tobacco use. Drug use. Emotional well-being. Home and relationship well-being. Sexual activity. Eating habits. History of falls. Memory and ability to understand (cognition). Work and work Statistician. Screening  You may have the following tests or measurements: Height, weight, and BMI. Blood pressure. Lipid and cholesterol levels. These may be checked every 5 years, or more frequently if you are over 79 years old. Skin check. Lung cancer screening. You may have this screening every year starting at age 8 if you have a 30-pack-year history of smoking and currently smoke or have quit within the past 15 years. Fecal occult blood test (FOBT) of the stool. You may have this test every year starting at age 32. Flexible sigmoidoscopy or colonoscopy. You may have a sigmoidoscopy every 5 years or a colonoscopy every 10 years starting at age 34. Prostate cancer screening. Recommendations will vary depending on your family history and other risks. Hepatitis C blood test. Hepatitis B blood test. Sexually transmitted disease (STD) testing. Diabetes screening. This is done by checking your blood sugar (glucose) after you have not eaten for a while (fasting). You may have this done every 1-3 years. Abdominal aortic aneurysm (AAA) screening. You may need this if you are a current or former smoker. Osteoporosis. You may be screened starting at age 4 if you are at high risk. Talk with your health care provider about your test results, treatment options, and if necessary, the need for more tests. Vaccines  Your health care provider may recommend certain vaccines, such as: Influenza vaccine. This is recommended every  year. Tetanus, diphtheria, and acellular pertussis (Tdap, Td) vaccine. You may need a Td booster  every 10 years. Zoster vaccine. You may need this after age 73. Pneumococcal 13-valent conjugate (PCV13) vaccine. One dose is recommended after age 27. Pneumococcal polysaccharide (PPSV23) vaccine. One dose is recommended after age 43. Talk to your health care provider about which screenings and vaccines you need and how often you need them. This information is not intended to replace advice given to you by your health care provider. Make sure you discuss any questions you have with your health care provider. Document Released: 02/23/2015 Document Revised: 10/17/2015 Document Reviewed: 11/28/2014 Elsevier Interactive Patient Education  2017 [employer] Prevention in the Home Falls can cause injuries. They can happen to people of all ages. There are many things you can do to make your home safe and to help prevent falls. What can I do on the outside of my home? Regularly fix the edges of walkways and driveways and fix any cracks. Remove anything that might make you trip as you walk through a door, such as a raised step or threshold. Trim any bushes or trees on the path to your home. Use bright outdoor lighting. Clear any walking paths of anything that might make someone trip, such as rocks or tools. Regularly check to see if handrails are loose or broken. Make sure that both sides of any steps have handrails. Any raised decks and porches should have guardrails on the edges. Have any leaves, snow, or ice cleared regularly. Use sand or salt on walking paths during winter. Clean up any spills in your garage right away. This includes oil or grease spills. What can I do in the bathroom? Use night lights. Install grab bars by the toilet and in the tub and shower. Do not use towel bars as grab bars. Use non-skid mats or decals in the tub or shower. If you need to sit down in the shower, use a plastic, non-slip stool. Keep the floor dry. Clean up any water that spills on the floor as soon  as it happens. Remove soap buildup in the tub or shower regularly. Attach bath mats securely with double-sided non-slip rug tape. Do not have throw rugs and other things on the floor that can make you trip. What can I do in the bedroom? Use night lights. Make sure that you have a light by your bed that is easy to reach. Do not use any sheets or blankets that are too big for your bed. They should not hang down onto the floor. Have a firm chair that has side arms. You can use this for support while you get dressed. Do not have throw rugs and other things on the floor that can make you trip. What can I do in the kitchen? Clean up any spills right away. Avoid walking on wet floors. Keep items that you use a lot in easy-to-reach places. If you need to reach something above you, use a strong step stool that has a grab bar. Keep electrical cords out of the way. Do not use floor polish or wax that makes floors slippery. If you must use wax, use non-skid floor wax. Do not have throw rugs and other things on the floor that can make you trip. What can I do with my stairs? Do not leave any items on the stairs. Make sure that there are handrails on both sides of the stairs and use them. Fix handrails that are broken or  loose. Make sure that handrails are as long as the stairways. Check any carpeting to make sure that it is firmly attached to the stairs. Fix any carpet that is loose or worn. Avoid having throw rugs at the top or bottom of the stairs. If you do have throw rugs, attach them to the floor with carpet tape. Make sure that you have a light switch at the top of the stairs and the bottom of the stairs. If you do not have them, ask someone to add them for you. What else can I do to help prevent falls? Wear shoes that: Do not have high heels. Have rubber bottoms. Are comfortable and fit you well. Are closed at the toe. Do not wear sandals. If you use a stepladder: Make sure that it is fully  opened. Do not climb a closed stepladder. Make sure that both sides of the stepladder are locked into place. Ask someone to hold it for you, if possible. Clearly mark and make sure that you can see: Any grab bars or handrails. First and last steps. Where the edge of each step is. Use tools that help you move around (mobility aids) if they are needed. These include: Canes. Walkers. Scooters. Crutches. Turn on the lights when you go into a dark area. Replace any light bulbs as soon as they burn out. Set up your furniture so you have a clear path. Avoid moving your furniture around. If any of your floors are uneven, fix them. If there are any pets around you, be aware of where they are. Review your medicines with your doctor. Some medicines can make you feel dizzy. This can increase your chance of falling. Ask your doctor what other things that you can do to help prevent falls. This information is not intended to replace advice given to you by your health care provider. Make sure you discuss any questions you have with your health care provider. Document Released: 11/23/2008 Document Revised: 07/05/2015 Document Reviewed: 03/03/2014 Elsevier Interactive Patient Education  2017 [employer].

## 2020-11-21 ENCOUNTER — Telehealth: Payer: Self-pay

## 2020-11-21 NOTE — Telephone Encounter (Signed)
Pt called requesting refill for a cough syrup. [name] [name] does not remember the name of medication. I do not see a cough syrup listed. Can Dr [name] refill the medication? Please Advise.

## 2020-11-22 NOTE — Telephone Encounter (Signed)
Patient need OV  Last OV was on 04/04/2019

## 2020-11-22 NOTE — Telephone Encounter (Signed)
Pt is scheduled °

## 2020-11-23 ENCOUNTER — Telehealth (INDEPENDENT_AMBULATORY_CARE_PROVIDER_SITE_OTHER): Payer: Medicare Other | Admitting: Family Medicine

## 2020-11-23 ENCOUNTER — Encounter: Payer: Self-pay | Admitting: Family Medicine

## 2020-11-23 VITALS — Ht 70.0 in | Wt 296.0 lb

## 2020-11-23 DIAGNOSIS — J3089 Other allergic rhinitis: Secondary | ICD-10-CM

## 2020-11-23 DIAGNOSIS — J302 Other seasonal allergic rhinitis: Secondary | ICD-10-CM

## 2020-11-23 DIAGNOSIS — E1169 Type 2 diabetes mellitus with other specified complication: Secondary | ICD-10-CM

## 2020-11-23 DIAGNOSIS — I10 Essential (primary) hypertension: Secondary | ICD-10-CM

## 2020-11-23 DIAGNOSIS — Z794 Long term (current) use of insulin: Secondary | ICD-10-CM | POA: Diagnosis not present

## 2020-11-23 DIAGNOSIS — R059 Cough, unspecified: Secondary | ICD-10-CM

## 2020-11-23 MED ORDER — SILVER SULFADIAZINE 1 % EX CREA: TOPICAL_CREAM | CUTANEOUS | 0 refills | Status: DC

## 2020-11-23 MED ORDER — FLUTICASONE PROPIONATE 50 MCG/ACT NA SUSP: 1.0000 | Freq: Every day | NASAL | 12 refills | Status: DC

## 2020-11-23 MED ORDER — PROMETHAZINE-CODEINE 6.25-10 MG/5ML PO SOLN: 5.0000 mL | Freq: Two times a day (BID) | ORAL | 0 refills | Status: DC | PRN

## 2020-11-23 NOTE — Progress Notes (Signed)
   [name] [name] is a 65 y.o. male who presents today for a virtual office visit.  Assessment/Plan:  Chronic Problems Addressed Today: HTN (hypertension) Reported blood pressures at goal.  Continue current regimen eplerenone 25mg  daily, losartan 25 mg daily, Metroprolol tartrate 50 mg twice daily, and torsemide 20 mg daily.  Cough Due to chronic bronchitis.  Still has a bit of a persistent cough.  Per his last pulmonologist record they are avoiding Hycodan cough syrup.  He can continue over-the-counter cough medications.  We will give small amount of promethazine-codeine cough syrup.  This has been prescribed to him in the past and he is done well with it.  He will need to follow back up with pulmonology if symptoms persist.  Type 2 diabetes mellitus ([name] [name]) Follows with endocrinology.  Blood sugars have been at goal.   Seasonal and perennial allergic rhinitis Refilled flonase today.      Subjective:  HPI:  See A/P for status of chronic conditions.  Patient was seen about 10 months ago.  Since her last visit he was diagnosed with COVID.  Did well.  He has had lingering cough since then however.  In the past he has been on codeine cough syrup which is worked well.  He would like to refill this today.  He saw his pulmonologist a couple of months ago who prescribed Tessalon however he did not feel like this was effective.  His pulmonologist have prescribed Hydromet in the past he would also like a refill on his allergy medications today.  He is otherwise doing well with his medications with no reported side effects.       Objective/Observations  Physical Exam: Gen: NAD, resting comfortably Pulm: Normal work of breathing Neuro: Grossly normal, moves all extremities Psych: Normal affect and thought content  Virtual Visit via Video   I connected with [name] on 11/23/20 at  2:40 PM EDT by a video enabled telemedicine application and verified that I am speaking with the correct  person using two identifiers. The limitations of evaluation and management by telemedicine and the availability of in person appointments were discussed. The patient expressed understanding and agreed to proceed.   Patient location: Home Provider location: [place] participating in the virtual visit: Myself and Patient     Algis Greenhouse. [name], MD 11/23/2020 3:43 PM

## 2020-11-23 NOTE — Assessment & Plan Note (Signed)
Follows with endocrinology.  Blood sugars have been at goal.

## 2020-11-23 NOTE — Assessment & Plan Note (Signed)
Refilled flonase today.

## 2020-11-23 NOTE — Assessment & Plan Note (Signed)
Due to chronic bronchitis.  Still has a bit of a persistent cough.  Per his last pulmonologist record they are avoiding Hycodan cough syrup.  He can continue over-the-counter cough medications.  We will give small amount of promethazine-codeine cough syrup.  This has been prescribed to him in the past and he is done well with it.  He will need to follow back up with pulmonology if symptoms persist.

## 2020-11-23 NOTE — Assessment & Plan Note (Signed)
Reported blood pressures at goal.  Continue current regimen eplerenone 25mg  daily, losartan 25 mg daily, Metroprolol tartrate 50 mg twice daily, and torsemide 20 mg daily.

## 2020-11-26 ENCOUNTER — Telehealth: Payer: Self-pay

## 2020-11-26 NOTE — Telephone Encounter (Signed)
Patient called in stating that the pharmacy does not carry the brand of cough medication he was prescribed on Friday. Asked for [facility] to be sent to [facility] Address: [address], [place], [place] [address].

## 2020-11-27 MED ORDER — PROMETHAZINE-CODEINE 6.25-10 MG/5ML PO SOLN: 5.0000 mL | Freq: Two times a day (BID) | ORAL | 0 refills | Status: DC | PRN

## 2020-11-28 NOTE — Telephone Encounter (Signed)
Pt called to check the status of his prescription. Please Advise.  

## 2020-11-28 NOTE — Telephone Encounter (Signed)
[facility] does not have in stock  Please resend to [facility]

## 2020-11-29 MED ORDER — PROMETHAZINE-CODEINE 6.25-10 MG/5ML PO SOLN: 5.0000 mL | Freq: Two times a day (BID) | ORAL | 0 refills | Status: DC | PRN

## 2020-11-29 NOTE — Telephone Encounter (Signed)
[facility] [address]

## 2020-11-29 NOTE — Addendum Note (Signed)
Addended by: [name] on: 11/29/2020 08:48 AM   Modules accepted: Orders

## 2020-12-03 NOTE — Telephone Encounter (Signed)
We can try tessalon again or he can try OTC broncolin.  Algis Greenhouse. [name], MD 12/03/2020 1:49 PM

## 2020-12-03 NOTE — Telephone Encounter (Signed)
Pt called back wanting to know what he can take if he cannot get the cough syrup. Please Advise.

## 2020-12-03 NOTE — Telephone Encounter (Signed)
Please advise 

## 2020-12-04 ENCOUNTER — Telehealth (INDEPENDENT_AMBULATORY_CARE_PROVIDER_SITE_OTHER): Payer: Medicare Other | Admitting: Family Medicine

## 2020-12-04 VITALS — Ht 70.0 in | Wt 296.0 lb

## 2020-12-04 DIAGNOSIS — R059 Cough, unspecified: Secondary | ICD-10-CM | POA: Diagnosis not present

## 2020-12-04 DIAGNOSIS — I10 Essential (primary) hypertension: Secondary | ICD-10-CM

## 2020-12-04 MED ORDER — GUAIFENESIN-CODEINE 100-10 MG/5ML PO SOLN: 5.0000 mL | Freq: Three times a day (TID) | ORAL | 0 refills | Status: DC | PRN

## 2020-12-04 NOTE — Assessment & Plan Note (Signed)
Reported blood pressures are at goal.  His current regimen is eplerenone 25 mg daily, losartan 25 mg daily, Toprol tartrate 50 mg twice daily, and torsemide 20 mg daily.  Tolerating these well without side effects.  He will come back in for office visit soon and we can recheck blood pressure in office at that point.  He will continue home monitoring in the meantime.

## 2020-12-04 NOTE — Progress Notes (Signed)
   [name] [name] is a 65 y.o. male who presents today for a virtual office visit.  Assessment/Plan:  Chronic Problems Addressed Today: HTN (hypertension) Reported blood pressures are at goal.  His current regimen is eplerenone 25 mg daily, losartan 25 mg daily, Toprol tartrate 50 mg twice daily, and torsemide 20 mg daily.  Tolerating these well without side effects.  He will come back in for office visit soon and we can recheck blood pressure in office at that point.  He will continue home monitoring in the meantime.  Cough Promethazine-codeine cough syrup no longer available.  We will send in guaifenesin-codeine.  He will need to follow-up with pulmonology soon.  They are avoiding Hycodan cough syrup.    Subjective:  HPI:  See A/p for status of chronic conditions. Still having issues with cough.  We saw him for this a few weeks ago.  He was not able to pick up cough syrup.       Objective/Observations  Physical Exam: Gen: NAD, resting comfortably Pulm: Normal work of breathing Neuro: Grossly normal, moves all extremities Psych: Normal affect and thought content  Virtual Visit via Video   I connected with [name] on 12/04/20 at  3:40 PM EDT by a video enabled telemedicine application and verified that I am speaking with the correct person using two identifiers. The limitations of evaluation and management by telemedicine and the availability of in person appointments were discussed. The patient expressed understanding and agreed to proceed.   Patient location: Home Provider location: [place] participating in the virtual visit: Myself and Patient     Algis Greenhouse. [name], MD 12/04/2020 8:33 AM

## 2020-12-04 NOTE — Telephone Encounter (Signed)
Patient had a virtual appointment with PCP today

## 2020-12-04 NOTE — Assessment & Plan Note (Signed)
Promethazine-codeine cough syrup no longer available.  We will send in guaifenesin-codeine.  He will need to follow-up with pulmonology soon.  They are avoiding Hycodan cough syrup.

## 2020-12-05 DIAGNOSIS — J45998 Other asthma: Secondary | ICD-10-CM | POA: Diagnosis not present

## 2020-12-08 ENCOUNTER — Other Ambulatory Visit: Payer: Self-pay | Admitting: Primary Care

## 2020-12-10 ENCOUNTER — Telehealth: Payer: Self-pay

## 2020-12-10 NOTE — Telephone Encounter (Signed)
Patient is calling in stating that his cough wont go away, was advised to call back so they can discuss next steps. Please advise.

## 2020-12-11 NOTE — Telephone Encounter (Signed)
Please advise 

## 2020-12-11 NOTE — Telephone Encounter (Signed)
Was he able to get the cough syrup we called in? Recommend he follow up with pulm if it is not helping.  Algis Greenhouse. [name], MD 12/11/2020 12:30 PM

## 2020-12-12 NOTE — Telephone Encounter (Signed)
Patient called in asking for an update. [name] [name] states he did take the cough syrup and it did help but he still has a cough when he lays down for too long. [name] [name] states he will contact pulmonology as well but wanted to check in.

## 2020-12-14 ENCOUNTER — Telehealth: Payer: Self-pay | Admitting: Internal Medicine

## 2020-12-14 NOTE — Telephone Encounter (Signed)
ATC x2--received recording to state my name and reason for call, as caller is using robo caller. After stating my name and reason for call, call gets disconnected. Will call back.

## 2020-12-15 MED ORDER — PREDNISONE 10 MG PO TABS: 40.0000 mg | ORAL_TABLET | Freq: Every day | ORAL | 0 refills | Status: AC

## 2020-12-15 NOTE — Progress Notes (Unsigned)
Ongoing cough, history of chronic bronchitis, 45py smoking. Not on inhalers, no PFTs with obstruction. Temporarily responds to ABX. Will trial 5 day course of prednisone - told him to be on the lookout for needing more insulin. Encouraged him to discuss flutter valve with Dr. Annamaria Boots on Monday.  [place]

## 2020-12-15 NOTE — Telephone Encounter (Signed)
Attempted to call the pt but the robo caller app is on his phone.  After stating my name the phone will ring and then hangs up.  Pt has an appt on 11/7 with BW.    Pt called me back and stated that he will keep the appt on Monday with BW>  he stated that he has been taking the coricidin OTC and this helps some but his cough is a dry productive cough with llight sputum.  He denies any fever or other symptoms.     He stated that he was given ABX 3 weeks ago and once he finished the abx the cough started 4 days later.  Just FYI.  Pt was advised that if he got worse over the weekend to seek emergency care.  Pt voiced his understanding.

## 2020-12-17 ENCOUNTER — Other Ambulatory Visit: Payer: Self-pay

## 2020-12-17 ENCOUNTER — Ambulatory Visit (INDEPENDENT_AMBULATORY_CARE_PROVIDER_SITE_OTHER): Payer: Medicare Other | Admitting: Primary Care

## 2020-12-17 ENCOUNTER — Ambulatory Visit (INDEPENDENT_AMBULATORY_CARE_PROVIDER_SITE_OTHER): Payer: Medicare Other

## 2020-12-17 ENCOUNTER — Encounter: Payer: Self-pay | Admitting: Primary Care

## 2020-12-17 VITALS — BP 118/80 | HR 74 | Temp 97.7°F | Ht 71.0 in | Wt 305.4 lb

## 2020-12-17 DIAGNOSIS — R918 Other nonspecific abnormal finding of lung field: Secondary | ICD-10-CM | POA: Diagnosis not present

## 2020-12-17 DIAGNOSIS — R0982 Postnasal drip: Secondary | ICD-10-CM | POA: Diagnosis not present

## 2020-12-17 DIAGNOSIS — I517 Cardiomegaly: Secondary | ICD-10-CM | POA: Diagnosis not present

## 2020-12-17 DIAGNOSIS — J410 Simple chronic bronchitis: Secondary | ICD-10-CM | POA: Diagnosis not present

## 2020-12-17 DIAGNOSIS — J40 Bronchitis, not specified as acute or chronic: Secondary | ICD-10-CM

## 2020-12-17 DIAGNOSIS — G4733 Obstructive sleep apnea (adult) (pediatric): Secondary | ICD-10-CM

## 2020-12-17 DIAGNOSIS — Z951 Presence of aortocoronary bypass graft: Secondary | ICD-10-CM | POA: Diagnosis not present

## 2020-12-17 NOTE — Assessment & Plan Note (Addendum)
-   Patient has had a chronic cough with clear mucus production for the last several months. CXR in September 2022 showed new reticulonodular opacities. Treated with Levaquin 500mg  qd x 7 days. Cough remains persistent. PND likely contributing. Advised he take mucinex 1200mg  twice daily x 1-2 weeks or until cough resolved. Start prednisone taper as directed. Recommend checking PFTs and sputum culture. Repeat imaging today.

## 2020-12-17 NOTE — Patient Instructions (Addendum)
Recommend  - Continue Flonase 1 puff per nostril once daily  - Start Zyrtec 10mg  daily (over the counter) - Use saline (ocean) nasal spray twice a day - Take prednisone taper as directed  - Take mucinex 1200mg  twice daily with glass of water x 1-2 weeks or until cough resolved   Orders: - Pulmonary function testing - Sputum culture (ordered) - CXR today   Follow-up: - Please schedule PFTs prior to visit with Dr. Annamaria Boots on December 7TH

## 2020-12-17 NOTE — Assessment & Plan Note (Signed)
-   Continue Flonase nasal spray - Add zyrtec 10mg  daily and Saline ocean spray twice daily

## 2020-12-17 NOTE — Assessment & Plan Note (Signed)
-   Received CPAP, has not started but plans on it - FU in December with Dr. Annamaria Boots

## 2020-12-17 NOTE — Progress Notes (Signed)
@Patient  ID: [name] [name], male    DOB: 1956-01-29, 65 y.o.   MRN: [id number]  Chief Complaint  Patient presents with   Follow-up    Cough for 2wks    Referring provider: [name], MD  HPI: 65 year old male, former smoker quit 2006 (45 pack year hx). PMH significant for OSA, seasonal allergies, diastolic CHF, hypertension, type 2 diabetes, hepatic cirrhosis, pancreatitis, PVD, HLD, obesity. Patient of Dr. Annamaria Boots, last seen on 04/19/20.   Previous [facility] pulmonary encounter: 04/19/20- Dr. Annamaria Boots   male former smoker followed for OSA, chronic bronchitis, SAR, CAD/MI/CABG/ CHF, HBP, DM, cirrhosis liver, Continues oxygen 2 L for sleep/ [facility]. PFT 06/18/2011-moderate restriction. Flows are normal for measure volume with FEV1/FVC 0.84 and insignificant response to bronchodilator. DLCO 52%. HST-08/05/2016-AHI 37.5/hour, desaturation to 67%, body weight 294 pounds HST 09/08/18- AHI 32.4/ hr, desaturation to 73%- avg 89%, body weight 298 lbs -------------------------------------------------------------------------------   04/19/20- 65 year old male former smoker followed for OSA, chronic bronchitis, SAR, CAD/MI/CABG, dCHF, DM2, PVD, hepatic cirrhosis, Morbid Obesity,  HST 09/08/18- AHI 32.4/ hr, desaturation to 73%- avg 89%, body weight 298 lbs CPAP auto 5-20/ [facility]  Ordered 11/01/2018 Download- compliance 0%, AHI 2.1/ hr Body weight today- Covid vax-2 phizer                          Wife here Flu vax-no -----Has been doing ok with his cpap.  He feels that his cpap needs to be adjusted. He stated that he had to remove the mask at times due to the air flow felt so strong.  He stated that he moved and he has not used it for about 2-3 weeks. Last download available is from January. Discussed compliance and goals. Asks refill hycodan cough syrup for "occasional cough only if catches cold". Has been constipated and having heartburn. Explained his cough syrup will be constipating No specific  cardiac or pulmonary events since last here. Does have chronic peripheral edema CXR 07/03/19-  IMPRESSION: 1. No active cardiopulmonary disease. 2. Chronic interstitial lung changes.  // need CXR for cough if persistent//   10/17/2020 Patient presents today for acute office visit. Hx severe sleep apnea and chronic bronchitis. He called our office back on 10/04/20 with complaints of cough. He had covid in July and was treated with paxlovid. He had previously been using mucinex for congestion and recently requested a refill of his hycodan and tessalon for cough. He was advised by Dr. Annamaria Boots to come in for a chest xray. He is accompanied by male friend for his visit today. He is doing well, continues to have a "nagging cough" and chest congestion. Cough is rarely productive. He is not currently taking anything for cough. He has no significant shortness of breath symptoms. He is in the process of moving and CPAP has been in storage sinced February 2022. CXR in 2021 showed chronic interstitial changes.    12/17/2020-Interim hx  Patient presents today for 4 week follow-up. He has had a productive cough for the last two months. Cough is productive with clear mucus. He has associated post nasal drip/congestion. He use Flonase occasionally. He was given prescription guaifenesin with codeine cough syrup on 10/14 from PCP which did help some. He was also sent in prescription prednisone by one of our providers on-call last night for on-going cough but has not picked this up.   Imaging: 10/17/20 CXR- New reticulonodular opacities, favored to represent atypical infection.  12/17/2020  CXR- Central pulmonary vascular prominence and persistent mild central peribronchial thickening. No new airspace disease.  Allergies  Allergen Reactions   Empagliflozin Rash    Patient reports yeast.    Ibuprofen Other (See Comments)    Stomach pain    Immunization History  Administered Date(s) Administered   Influenza Split  11/11/2010, 10/23/2011, 11/10/2012   Influenza,inj,Quad PF,6+ Mos 11/10/2013, 12/10/2016   Influenza-Unspecified 11/30/2015, 11/01/2020   PFIZER(Purple Top)SARS-COV-2 Vaccination 05/17/2019, 06/06/2019   Pneumococcal Polysaccharide-23 06/26/2008   Tdap 09/01/2019    Past Medical History:  Diagnosis Date   Acute myocardial infarction, unspecified site, episode of care unspecified 2005   Acute pancreatitis    CAD (coronary artery disease)    a. CABG in 2005 w LIMA to LAD, left radial to second circumflex marginal, saphenous vein graft to PDA, saphenous vein graft to lateral subbrach of ramus intermediate, and sequential saphenous vein graft to the medial subbranch of ramus intermediate.   Chronic diastolic CHF (congestive heart failure) (HCC)    Cirrhosis of liver without mention of alcohol    CKD (chronic kidney disease), stage II    Complications affecting other specified body systems, hypertension    Ectopic atrial rhythm    Essential hypertension    Hyperlipidemia    Hypokalemia    a. intermitttent noncompliance with potassium supplement.   Hypomagnesemia    Morbid obesity (HCC)    Neuropathy    Other and unspecified hyperlipidemia    Proteinuria    RBBB    Sleep apnea    Type II or unspecified type diabetes mellitus without mention of complication, not stated as uncontrolled    Varicose veins of both lower extremities     Tobacco History: Social History   Tobacco Use  Smoking Status Former   Packs/day: 1.50   Years: 30.00   Pack years: 45.00   Types: Cigarettes   Quit date: 10/20/2004   Years since quitting: 16.1  Smokeless Tobacco Never   Counseling given: Not Answered   Outpatient Medications Prior to Visit  Medication Sig Dispense Refill   aspirin EC 81 MG tablet Take 81 mg by mouth daily.     B-D ULTRAFINE III SHORT PEN 31G X 8 MM MISC   1   collagenase (SANTYL) ointment Apply 1 application topically daily. Right ankle measures 6.0 x 0.3cm. 30 g 5    diclofenac Sodium (VOLTAREN) 1 % GEL APPLY 2 GRAMS TOPICALLY 4 TIMES DAILY TO KNEE 100 g 2   eplerenone (INSPRA) 25 MG tablet TAKE 1 TABLET(25 MG) BY MOUTH DAILY 90 tablet 2   famotidine (PEPCID) 20 MG tablet Take 1 tablet (20 mg total) by mouth daily. 90 tablet 3   fluticasone (FLONASE) 50 MCG/ACT nasal spray Place 1 spray into both nostrils daily. 16 g 12   HUMULIN R U-500 KWIKPEN 500 UNIT/ML kwikpen Inject 50-60 Units into the skin See admin instructions. 60 units in the AM, 50 units at lunch and 50 in the evening 3 mL 0   ipratropium-albuterol (DUONEB) 0.5-2.5 (3) MG/3ML SOLN 1 vial in nebulizer up to 4 times daily 90 mL 12   Lancets (ACCU-CHEK MULTICLIX) lancets      LANTUS SOLOSTAR 100 UNIT/ML Solostar Pen Inject 55 Units into the skin every morning.     losartan (COZAAR) 50 MG tablet Take 0.5 tablets (25 mg total) by mouth daily. 45 tablet 3   MAGNESIUM-OXIDE 400 (241.3 Mg) MG tablet Take 1 tablet by mouth 2 (two) times daily.  metoprolol tartrate (LOPRESSOR) 50 MG tablet TAKE 1 TABLET(50 MG) BY MOUTH TWICE DAILY 180 tablet 2   nitroGLYCERIN (NITROSTAT) 0.4 MG SL tablet Place 1 tablet (0.4 mg total) under the tongue every 5 (five) minutes as needed for chest pain. 25 tablet 4   nystatin ointment (MYCOSTATIN) Apply 1 application topically 2 (two) times daily. 30 g 0   ONETOUCH ULTRA test strip USE 8 TIMES DAILY TO CHECK BLOOD SUGAR 800 strip 0   polyethylene glycol (MIRALAX / GLYCOLAX) 17 g packet Take 17 g by mouth daily. 14 each 0   potassium chloride SA (KLOR-CON) 20 MEQ tablet TAKE 2 TABLETS(40 MEQ) BY MOUTH THREE TIMES DAILY 540 tablet 0   PROAIR HFA 108 (90 Base) MCG/ACT inhaler INHALE 2 PUFFS INTO THE LUNGS EVERY 6 HOURS AS NEEDED FOR WHEEZING OR SHORTNESS OF BREATH 8.5 g 5   rosuvastatin (CRESTOR) 20 MG tablet Take 1 tablet (20 mg total) by mouth every evening. 30 tablet 11   silver sulfADIAZINE (SILVADENE) 1 % cream Apply pea-sized amount to wound daily. 50 g 0   simvastatin  (ZOCOR) 80 MG tablet Take 1/2 tablet BY MOUTH ( 40 mg total) one time daily     torsemide (DEMADEX) 20 MG tablet TAKE 1 TABLET(20 MG) BY MOUTH TWICE DAILY 180 tablet 3   benzonatate (TESSALON) 100 MG capsule Take 2 capsules (200 mg total) by mouth 3 (three) times daily. (Patient not taking: Reported on 12/17/2020) 30 capsule 1   guaiFENesin-codeine 100-10 MG/5ML syrup Take 5 mLs by mouth 3 (three) times daily as needed for cough. (Patient not taking: Reported on 12/17/2020) 120 mL 0   predniSONE (DELTASONE) 10 MG tablet Take 4 tablets (40 mg total) by mouth daily with breakfast for 5 doses. (Patient not taking: Reported on 12/17/2020) 20 tablet 0   No facility-administered medications prior to visit.    Review of Systems  Review of Systems  Constitutional: Negative.   HENT:  Positive for congestion and postnasal drip.   Respiratory:  Positive for cough. Negative for chest tightness, shortness of breath and wheezing.   Cardiovascular: Negative.     Physical Exam  BP 118/80 (BP Location: Left Arm, Cuff Size: Large)   Pulse 74   Temp 97.7 F (36.5 C)   Ht 5\' 11"  (1.803 m)   Wt (!) 305 lb 6.4 oz (138.5 kg)   SpO2 94%   BMI 42.59 kg/m  Physical Exam Constitutional:      Appearance: Normal appearance.  HENT:     Mouth/Throat:     Mouth: Mucous membranes are moist.     Pharynx: Oropharynx is clear.     Comments: Mallampati class III Cardiovascular:     Rate and Rhythm: Normal rate and regular rhythm.  Pulmonary:     Effort: Pulmonary effort is normal.     Breath sounds: Normal breath sounds. No wheezing, rhonchi or rales.     Comments: CTA Neurological:     General: No focal deficit present.     Mental Status: He is alert and oriented to person, place, and time. Mental status is at baseline.  Psychiatric:        Mood and Affect: Mood normal.        Behavior: Behavior normal.        Thought Content: Thought content normal.        Judgment: Judgment normal.     Lab  Results:  CBC    Component Value Date/Time   WBC 5.0 09/02/2020  0309   RBC 4.46 09/02/2020 0309   HGB 14.8 09/02/2020 0309   HCT 43.9 09/02/2020 0309   PLT 169 09/02/2020 0309   MCV 98.4 09/02/2020 0309   MCH 33.2 09/02/2020 0309   MCHC 33.7 09/02/2020 0309   RDW 13.5 09/02/2020 0309   LYMPHSABS 3.6 09/01/2019 2150   MONOABS 0.9 09/01/2019 2150   EOSABS 0.2 09/01/2019 2150   BASOSABS 0.1 09/01/2019 2150    BMET    Component Value Date/Time   NA 133 (L) 09/02/2020 0309   NA 138 12/12/2019 1212   K 3.6 09/02/2020 0309   CL 95 (L) 09/02/2020 0309   CO2 29 09/02/2020 0309   GLUCOSE 318 (H) 09/02/2020 0309   BUN 20 09/02/2020 0309   BUN 16 12/12/2019 1212   CREATININE 1.31 (H) 09/02/2020 0309   CREATININE 1.01 03/20/2017 1514   CALCIUM 8.8 (L) 09/02/2020 0309   GFRNONAA >60 09/02/2020 0309   GFRAA 65 12/12/2019 1212    BNP    Component Value Date/Time   BNP 30.3 03/24/2016 1519    ProBNP    Component Value Date/Time   PROBNP 367 06/03/2019 1245   PROBNP 80.0 10/07/2018 1447    Imaging: DG Chest 2 View  Result Date: 12/17/2020 CLINICAL DATA:  Follow-up bronchitis EXAM: CHEST - 2 VIEW COMPARISON:  Radiograph 10/17/2020 FINDINGS: Unchanged, enlarged cardiac silhouette with prior median sternotomy and CABG. There is mild central pulmonary vascular prominence and persistent mild central peribronchial thickening. No new airspace disease. No large pleural effusion or visible pneumothorax. No acute osseous abnormality. Unchanged appearance of the right AC joint. IMPRESSION: Central pulmonary vascular prominence and persistent mild central peribronchial thickening. No new airspace disease. Electronically Signed   By: Maurine Simmering M.D.   On: 12/17/2020 11:34     Assessment & Plan:   Simple chronic bronchitis ([place]) - Patient has had a chronic cough with clear mucus production for the last several months. CXR in September 2022 showed new reticulonodular opacities. Treated  with Levaquin 500mg  qd x 7 days. Cough remains persistent. PND likely contributing. Advised he take mucinex 1200mg  twice daily x 1-2 weeks or until cough resolved. Start prednisone taper as directed. Recommend checking PFTs and sputum culture. Repeat imaging today.   PND (post-nasal drip) - Continue Flonase nasal spray - Add zyrtec 10mg  daily and Saline ocean spray twice daily   Obstructive sleep apnea - Received CPAP, has not started but plans on it - FU in December with Dr. Kaleen Mask, NP 12/17/2020

## 2020-12-17 NOTE — Progress Notes (Signed)
Please let patient know CXR showed no acutely concerning findings.   He had mild bronchial thickening related to chronic bronchitis, we are checking sputum sample and PFTs regarding this already. He also had mild pulmonary vascular prominence which can be from pulmonary edema or cardiovascular disease, however, he was not hypoxemic and did not complain of shortness of breath so we will continue to monitor.

## 2020-12-18 ENCOUNTER — Encounter: Payer: Self-pay | Admitting: *Deleted

## 2020-12-26 DIAGNOSIS — J40 Bronchitis, not specified as acute or chronic: Secondary | ICD-10-CM | POA: Diagnosis not present

## 2020-12-26 DIAGNOSIS — J45998 Other asthma: Secondary | ICD-10-CM | POA: Diagnosis not present

## 2021-01-06 ENCOUNTER — Other Ambulatory Visit: Payer: Self-pay | Admitting: Family Medicine

## 2021-01-07 ENCOUNTER — Telehealth: Payer: Self-pay | Admitting: Cardiology

## 2021-01-07 NOTE — Telephone Encounter (Signed)
Patient would like to know if it's okay for him to use Voltaren joint cream

## 2021-01-08 NOTE — Telephone Encounter (Signed)
LM for pt on home VM - OK to use Voltaren cream per Dr Marlou Porch.  Requested pt c/b if any further questions or concerns.

## 2021-01-08 NOTE — Telephone Encounter (Signed)
He may use Voltaren cream.  Candee Furbish, MD

## 2021-01-10 ENCOUNTER — Telehealth: Payer: Self-pay

## 2021-01-10 NOTE — Telephone Encounter (Signed)
No call by EOB to r/s PV.  NS letter sent with procedure cancelled.

## 2021-01-10 NOTE — Telephone Encounter (Signed)
Pt was a NS for PV.  Attempted to call x 2 with no answer and call self terminating.   Will wait to see if pt tries to reach [place] to RS today by EOB, if not will send NS letter and cancel procedure.

## 2021-01-11 ENCOUNTER — Encounter: Payer: Self-pay | Admitting: Family Medicine

## 2021-01-15 NOTE — Progress Notes (Signed)
HPI  male former smoker followed for OSA, chronic bronchitis, SAR, CAD/MI/CABG/ CHF, HBP, DM, cirrhosis liver, Continues oxygen 2 L for sleep/ [facility]. PFT 06/18/2011-moderate restriction. Flows are normal for measure volume with FEV1/FVC 0.84 and insignificant response to bronchodilator. DLCO 52%. HST-08/05/2016-AHI 37.5/hour, desaturation to 67%, body weight 294 pounds HST 09/08/18- AHI 32.4/ hr, desaturation to 73%- avg 89%, body weight 298 lbs -------------------------------------------------------------------------------   04/19/20- 65 year old male former smoker followed for OSA, chronic bronchitis, SAR, CAD/MI/CABG, dCHF, DM2, PVD, hepatic cirrhosis, Morbid Obesity,  HST 09/08/18- AHI 32.4/ hr, desaturation to 73%- avg 89%, body weight 298 lbs CPAP auto 5-20/ [facility]  Ordered 11/01/2018 Download- compliance 0%, AHI 2.1/ hr Body weight today- Covid vax-2 phizer                          Wife here Flu vax-no -----Has been doing ok with his cpap.  He feels that his cpap needs to be adjusted. He stated that he had to remove the mask at times due to the air flow felt so strong.  He stated that he moved and he has not used it for about 2-3 weeks. Last download available is from January. Discussed compliance and goals. Asks refill hycodan cough syrup for "occasional cough only if catches cold". Has been constipated and having heartburn. Explained his cough syrup will be constipating No specific cardiac or pulmonary events since last here. Does have chronic peripheral edema CXR 07/03/19-  IMPRESSION: 1. No active cardiopulmonary disease. 2. Chronic interstitial lung changes.   01/16/21-  65 year old male former smoker(45 pk yrs) followed for OSA, chronic bronchitis, SAR, CAD/MI/CABG, dCHF, DM2, PVD, hepatic cirrhosis, Morbid Obesity, Covid infection July 2022,  CPAP auto 5-20/ [facility]  Ordered 11/01/2018 Download- compliance Body weight today-309 lbs Covid vax-2 phizer                           Flu vax-had [facility] NP Volanda Napoleon 11/7- bronchitis, rhinitis> flonase, zyrtec, saline spray, pred taper, mucinex, PFT -----C/o cough-white since July 2022 with covid PFT scheduled for 12/19. Not using CPAP or O2- in storage since Spring. He says his nebulizer machine and his CPAP machine are in the back of the storage unit since he moved.  I emphasized that it was time to go in and pull those out, resume CPAP use and get ready for winter. He describes some persistent cough with scant white sputum since COVID infection in July.  Cough is noticed most when lying down or if he feels hot.  He does not seem to be coughing with food or drink and he does not notice wheeze. He indicates a lymph node under the angle of right mandible that he can feel sometimes, nontender.  I have asked him to point this out to his primary physician.  He does not think it is growing and is not aware of any others. CXR 12/17/20 IMPRESSION: Central pulmonary vascular prominence and persistent mild central peribronchial thickening. No new airspace disease.  ROS-see HPI   + = positive Constitutional:   No-   weight loss, night sweats, fevers, chills, + fatigue, lassitude. HEENT:   No-  headaches, difficulty swallowing, tooth/dental problems, sore throat,       No-  sneezing, itching, ear ache, nasal congestion, post nasal drip,  CV:  No-   chest pain, orthopnea, PND, + swelling in lower extremities, anasarca,  dizziness, palpitations Resp: + Shortness of breath with exertion  or at rest.              No-   productive cough,  No non-productive cough,  No- coughing up of blood.              No-   change in color of mucus.  + Occasional wheezing.   Skin: No-   rash or lesions. GI:  No-   heartburn, indigestion, abdominal pain, nausea, vomiting,  GU: . MS:  No-   joint pain or swelling.   Neuro-     nothing unusual Psych:  No- change in mood or affect. No depression or anxiety.  No memory loss.   OBJ- Physical Exam General- Alert,  Oriented, Affect-appropriate, Distress- none acute, +obese,  Skin- +stasis changes legs, + Increased skin turgor? Edema Lymphadenopathy- + thick neck.  I feel some fullness in the area indicated and have asked him to show this to his PCP. Head- atraumatic            Eyes- Gross vision intact, PERRLA, conjunctivae and secretions clear            Ears- Hearing, canals-normal            Nose- Clear, no-Septal dev, mucus, polyps, erosion, perforation             Throat- Mallampati IV , mucosa clear , drainage- none, tonsils- atrophic, +missing teeth Neck- flexible , trachea midline, no stridor , thyroid nl, carotid no bruit Chest - symmetrical excursion , unlabored           Heart/CV- RRR , no murmur , no gallop  , no rub, nl s1 s2                           - JVD- none , edema- none, stasis changes- none, varices- none           Lung- clear to P&A, wheeze- none, cough- none , dullness-none, rub- none           Chest wall- sternotomy scar Abd-  Br/ Gen/ Rectal- Not done, not indicated Extrem- cyanosis- none, clubbing, none, atrophy- none, strength- nl. Vascular graft donor site scar left forearm. + Cane, + chronic brawny peripheral edema w stasis changes in calves. Neuro- grossly intact to observation

## 2021-01-16 ENCOUNTER — Encounter: Payer: Self-pay | Admitting: Internal Medicine

## 2021-01-16 ENCOUNTER — Ambulatory Visit (INDEPENDENT_AMBULATORY_CARE_PROVIDER_SITE_OTHER): Payer: Medicare Other | Admitting: Internal Medicine

## 2021-01-16 ENCOUNTER — Other Ambulatory Visit: Payer: Self-pay

## 2021-01-16 DIAGNOSIS — G4733 Obstructive sleep apnea (adult) (pediatric): Secondary | ICD-10-CM

## 2021-01-16 DIAGNOSIS — J410 Simple chronic bronchitis: Secondary | ICD-10-CM

## 2021-01-16 NOTE — Assessment & Plan Note (Signed)
I have emphasized that he needs to pull his machine out of storage and get back to regular use.  Auto 5-20/[facility]

## 2021-01-16 NOTE — Assessment & Plan Note (Signed)
Mild cough and imaging are consistent with some chronic bronchitis, likely aggravated by COVID infection this summer.  I do not think he has an ongoing bacterial infection.  If this persists we can offer a maintenance inhaler for trial.  We did not have Breztri or Trelegy available today.

## 2021-01-16 NOTE — Patient Instructions (Signed)
Order- sample Trelegy 100    inhale 1 puff, then rinse mouth, once daily. See if this helps the cough.  Keep your appointment for the pulmonary function test  Please get your CPAP machine and your nebulizer machine out of storage and get back to using them.

## 2021-01-18 ENCOUNTER — Encounter: Payer: Medicare Other | Admitting: Internal Medicine

## 2021-01-22 ENCOUNTER — Telehealth: Payer: Self-pay | Admitting: Internal Medicine

## 2021-01-22 NOTE — Telephone Encounter (Signed)
Ok to send Rx for Trelegy 100, # 60, ref x 5    inhale 1 puff then rinse mouth, once daily

## 2021-01-22 NOTE — Telephone Encounter (Signed)
Call made to patient, confirmed DOB. Patient was calling to see if we have any samples of Trelegy. None at this time.   Cy please advise would you like [place] to go ahead and send to pharmacy. Thanks

## 2021-01-25 ENCOUNTER — Other Ambulatory Visit: Payer: Self-pay | Admitting: *Deleted

## 2021-01-25 MED ORDER — TRELEGY ELLIPTA 100-62.5-25 MCG/ACT IN AEPB: 100.0000 ug | INHALATION_SPRAY | Freq: Every day | RESPIRATORY_TRACT | 3 refills | Status: AC

## 2021-01-25 NOTE — Progress Notes (Signed)
Patient called and ask for sample of Trelegy. We currently don't have Trelegy. Dr. Annamaria Boots wanted rx sent to pharmacy. Patient aware/ rx sent.

## 2021-01-29 MED ORDER — TRELEGY ELLIPTA 100-62.5-25 MCG/ACT IN AEPB: 1.0000 | INHALATION_SPRAY | Freq: Every day | RESPIRATORY_TRACT | 5 refills | Status: DC

## 2021-01-29 NOTE — Telephone Encounter (Signed)
Refill sent in.   Call made to patient, made aware. Aware to call back if expensive.   Nothing further needed at this time.

## 2021-01-30 ENCOUNTER — Ambulatory Visit: Payer: Medicare Other | Admitting: Family Medicine

## 2021-02-12 ENCOUNTER — Encounter: Payer: Self-pay | Admitting: Family Medicine

## 2021-02-12 ENCOUNTER — Ambulatory Visit (INDEPENDENT_AMBULATORY_CARE_PROVIDER_SITE_OTHER): Payer: Commercial Managed Care - HMO | Admitting: Family Medicine

## 2021-02-12 ENCOUNTER — Other Ambulatory Visit: Payer: Self-pay

## 2021-02-12 VITALS — BP 118/70 | HR 78 | Temp 98.1°F | Ht 71.0 in | Wt 305.4 lb

## 2021-02-12 DIAGNOSIS — M79642 Pain in left hand: Secondary | ICD-10-CM

## 2021-02-12 DIAGNOSIS — J410 Simple chronic bronchitis: Secondary | ICD-10-CM

## 2021-02-12 DIAGNOSIS — I4719 Other supraventricular tachycardia: Secondary | ICD-10-CM

## 2021-02-12 DIAGNOSIS — M545 Low back pain, unspecified: Secondary | ICD-10-CM

## 2021-02-12 DIAGNOSIS — G63 Polyneuropathy in diseases classified elsewhere: Secondary | ICD-10-CM

## 2021-02-12 DIAGNOSIS — L97512 Non-pressure chronic ulcer of other part of right foot with fat layer exposed: Secondary | ICD-10-CM | POA: Diagnosis not present

## 2021-02-12 DIAGNOSIS — I471 Supraventricular tachycardia: Secondary | ICD-10-CM

## 2021-02-12 DIAGNOSIS — I5032 Chronic diastolic (congestive) heart failure: Secondary | ICD-10-CM

## 2021-02-12 DIAGNOSIS — I739 Peripheral vascular disease, unspecified: Secondary | ICD-10-CM | POA: Diagnosis not present

## 2021-02-12 DIAGNOSIS — E08621 Diabetes mellitus due to underlying condition with foot ulcer: Secondary | ICD-10-CM | POA: Diagnosis not present

## 2021-02-12 DIAGNOSIS — Z794 Long term (current) use of insulin: Secondary | ICD-10-CM

## 2021-02-12 DIAGNOSIS — E1169 Type 2 diabetes mellitus with other specified complication: Secondary | ICD-10-CM | POA: Diagnosis not present

## 2021-02-12 DIAGNOSIS — I25708 Atherosclerosis of coronary artery bypass graft(s), unspecified, with other forms of angina pectoris: Secondary | ICD-10-CM | POA: Diagnosis not present

## 2021-02-12 MED ORDER — METHYLPREDNISOLONE 4 MG PO TBPK: ORAL_TABLET | ORAL | 0 refills | Status: DC

## 2021-02-12 MED ORDER — HIBICLENS 4 % EX LIQD: Freq: Every day | CUTANEOUS | 0 refills | Status: DC | PRN

## 2021-02-12 NOTE — Assessment & Plan Note (Signed)
Follows with cardiology.  Regular rate and rhythm today. 

## 2021-02-12 NOTE — Progress Notes (Signed)
[name] [name] is a 66 y.o. male who presents today for an office visit.  Assessment/Plan:  New/Acute Problems: Left Hand Pain Likely multifactorial though does have positive Tinel sign at left wrist consistent with possible carpal tunnel.  Limited treatment options due to his significant cardiac history.  We will avoid NSAIDs.  Start Medrol Dosepak.  Will place referral to sports medicine for further evaluation management  Left lower back pain No red flags.  Seems to be consistent with muscular strain.  He would like to avoid muscle relaxers due to concern for side effects including sedation.  We will start Medrol dose pack as above which should help.  Avoiding NSAIDs.  He will follow-up with sports medicine.  Discussed reasons to return to care.  Chronic Problems Addressed Today: PVD (peripheral vascular disease) ([place]) Follows with cardiology.  On aspirin and statin.  Morbid obesity ([place]) Gave handout discussing lifestyle modifications.   Ectopic atrial tachycardia ([place]) Follows with cardiology.  Regular rate and rhythm today.  Simple chronic bronchitis ([place]) Follows with pulmonology.  We will be doing Medrol Dosepak as above which should help.  Type 2 diabetes mellitus ([place]) Follows with endocrinology.  Reported blood sugars have been at goal.  He is on insulin.  Discussed potential for sugars to go up with Depo-Medrol.  Coronary artery disease of bypass graft of native heart with stable angina pectoris [facility]) Also cardiology.  On aspirin and statin.  Chronic heart failure with preserved ejection fraction [facility]) Also cardiology.  No signs of volume overload today.  Polyneuropathy associated with underlying disease ([place]) Pain in left upper extremity not consistent with diabetic neuropathy though possibly contributing.     Subjective:  HPI:  He complain of left hand pain.  He is here today with his friend. He notes his arm ache all the time. Associated symptoms are  numbness and tingling. Symptoms seems to be getting worse. He tried Tynelol for this issue. He notes it worked for a while but wears off. He has undergone a ulnar nerve transposition on the right elbow in the past. He saw his doctor for this issue. They think it is pinched nerve. He is interested to sport medicine for this issue.   He also complain of left lower back pain. This has been going on for a while. He tried [employer] which has not helped. He notes symptoms are  getting worse. He has not tried muscle relaxer for this issue. He notes improved with rest and sometimes with Tynelol.  He still have issue with cough. He notes cough syrup that was prescribed last time is no longer available.   See A/p for status of chronic conditions.        Objective:  Physical Exam: BP 118/70    Pulse 78    Temp 98.1 F (36.7 C) (Temporal)    Ht 5\' 11"  (1.803 m)    Wt (!) 305 lb 6.4 oz (138.5 kg)    SpO2 98%    BMI 42.59 kg/m   Gen: No acute distress, resting comfortably CV: Regular rate and rhythm with no murmurs appreciated Pulm: Normal work of breathing, clear to auscultation bilaterally with no crackles, wheezes, or rhonchi MSK:  - Left Hand: Scar present along volar aspect of left forearm.  Tinel sign positive at left wrist.  Normal strength.  Good distal cap refill. - Back: No deformities.  Tenderness to palpation along left lower lumbar paraspinal muscles.  Neurovascular intact distally. Neuro: Grossly normal, moves all extremities  Psych: Normal affect and thought content       I,Savera Zaman,acting as a scribe for Dimas Chyle, MD.,have documented all relevant documentation on the behalf of Dimas Chyle, MD,as directed by  Dimas Chyle, MD while in the presence of Dimas Chyle, MD.   I, Dimas Chyle, MD, have reviewed all documentation for this visit. The documentation on 02/12/21 for the exam, diagnosis, procedures, and orders are all accurate and complete.  Algis Greenhouse. [name], MD 02/12/2021  9:10 AM

## 2021-02-12 NOTE — Assessment & Plan Note (Signed)
Follows with cardiology.  On aspirin and statin.

## 2021-02-12 NOTE — Assessment & Plan Note (Signed)
Gave handout discussing lifestyle modifications.

## 2021-02-12 NOTE — Assessment & Plan Note (Signed)
Also cardiology.  No signs of volume overload today.

## 2021-02-12 NOTE — Assessment & Plan Note (Signed)
Also cardiology.  On aspirin and statin.

## 2021-02-12 NOTE — Assessment & Plan Note (Signed)
Pain in left upper extremity not consistent with diabetic neuropathy though possibly contributing.

## 2021-02-12 NOTE — Assessment & Plan Note (Signed)
Follows with pulmonology.  We will be doing Medrol Dosepak as above which should help.

## 2021-02-12 NOTE — Patient Instructions (Signed)
It was very nice to see you today!  I think you probably have a pinched nerve in your arm causing the symptoms in your hand.  Please start the Medrol Dosepak.  I will refer you to see sports medicine.  This should also help some with your back pain.  Please let us know if your symptoms or not improving in the next couple weeks.  Take care, Dr [name]  PLEASE NOTE:  If you had any lab tests please let us know if you have not heard back within a few days. You may see your results on mychart before we have a chance to review them but we will give you a call once they are reviewed by [place]. If we ordered any referrals today, please let us know if you have not heard from their office within the next week.   Please try these tips to maintain a healthy lifestyle:  Eat at least 3 REAL meals and 1-2 snacks per day.  Aim for no more than 5 hours between eating.  If you eat breakfast, please do so within one hour of getting up.   Each meal should contain half fruits/vegetables, one quarter protein, and one quarter carbs (no bigger than a computer mouse)  Cut down on sweet beverages. This includes juice, soda, and sweet tea.   Drink at least 1 glass of water with each meal and aim for at least 8 glasses per day  Exercise at least 150 minutes every week.

## 2021-02-12 NOTE — Assessment & Plan Note (Signed)
Follows with endocrinology.  Reported blood sugars have been at goal.  He is on insulin.  Discussed potential for sugars to go up with Depo-Medrol.

## 2021-02-13 ENCOUNTER — Other Ambulatory Visit: Payer: Self-pay | Admitting: Cardiology

## 2021-02-13 DIAGNOSIS — L97522 Non-pressure chronic ulcer of other part of left foot with fat layer exposed: Secondary | ICD-10-CM | POA: Diagnosis not present

## 2021-02-13 DIAGNOSIS — I739 Peripheral vascular disease, unspecified: Secondary | ICD-10-CM | POA: Diagnosis not present

## 2021-02-13 DIAGNOSIS — L84 Corns and callosities: Secondary | ICD-10-CM | POA: Diagnosis not present

## 2021-02-13 DIAGNOSIS — L603 Nail dystrophy: Secondary | ICD-10-CM | POA: Diagnosis not present

## 2021-02-13 DIAGNOSIS — E1151 Type 2 diabetes mellitus with diabetic peripheral angiopathy without gangrene: Secondary | ICD-10-CM | POA: Diagnosis not present

## 2021-02-20 DIAGNOSIS — L97522 Non-pressure chronic ulcer of other part of left foot with fat layer exposed: Secondary | ICD-10-CM | POA: Diagnosis not present

## 2021-02-21 NOTE — Progress Notes (Deleted)
° ° °  Subjective:    CC: L hand and L-sided low back pain  I, Neidy Guerrieri, LAT, ATC, am serving as scribe for Dr. Lynne Leader.  HPI: Pt is a 66 y/o male presenting w/ c/o L hand/wrist pain and L-sided low back pain.  L hand/wrist: Pain and paresthesias x . -Swelling: -Numbness/tingling: -Aggravating factors: -Treatments tried: medrol dose pack  Low back pain: -Radiating pain: -LE numbness/tingling: -Aggravating factors: -Treatments tried: medrol dose pack  Diagnostic testing: L hand XR- 06/06/19  Pertinent review of Systems: ***  Relevant historical information: ***   Objective:   There were no vitals filed for this visit. General: Well Developed, well nourished, and in no acute distress.   MSK: ***  Lab and Radiology Results No results found for this or any previous visit (from the past 72 hour(s)). No results found.    Impression and Recommendations:    Assessment and Plan: 66 y.o. male with ***.  PDMP not reviewed this encounter. No orders of the defined types were placed in this encounter.  No orders of the defined types were placed in this encounter.   Discussed warning signs or symptoms. Please see discharge instructions. Patient expresses understanding.   ***

## 2021-02-22 ENCOUNTER — Ambulatory Visit: Payer: Commercial Managed Care - HMO | Admitting: Family Medicine

## 2021-02-23 ENCOUNTER — Other Ambulatory Visit: Payer: Self-pay | Admitting: Family Medicine

## 2021-02-25 MED ORDER — ONETOUCH ULTRA VI STRP: ORAL_STRIP | 0 refills | Status: DC

## 2021-03-02 DIAGNOSIS — R739 Hyperglycemia, unspecified: Secondary | ICD-10-CM | POA: Diagnosis not present

## 2021-03-02 DIAGNOSIS — R450 Nervousness: Secondary | ICD-10-CM | POA: Diagnosis not present

## 2021-03-15 ENCOUNTER — Telehealth: Payer: Self-pay | Admitting: Family Medicine

## 2021-03-15 NOTE — Chronic Care Management (AMB) (Signed)
°  Chronic Care Management   Note  03/15/2021 Name: [name] [name] MRN: [id number] DOB: Mar 15, 1955  [name] [name] is a 65 y.o. year old male who is a primary care patient of [name], MD. I reached out to [name] by phone today in response to a referral sent by [name] [name] PCP, [name], MD.   [name] [name] was given information about Chronic Care Management services today including:  CCM service includes personalized support from designated clinical staff supervised by his physician, including individualized plan of care and coordination with other care providers 24/7 contact phone numbers for assistance for urgent and routine care needs. Service will only be billed when office clinical staff spend 20 minutes or more in a month to coordinate care. Only one practitioner may furnish and bill the service in a calendar month. The patient may stop CCM services at any time (effective at the end of the month) by phone call to the office staff.   Patient agreed to services and verbal consent obtained.   Follow up plan:   Tatjana Secretary/administrator

## 2021-03-25 ENCOUNTER — Ambulatory Visit (INDEPENDENT_AMBULATORY_CARE_PROVIDER_SITE_OTHER): Payer: Medicare Other | Admitting: Family Medicine

## 2021-03-25 ENCOUNTER — Ambulatory Visit: Payer: Self-pay

## 2021-03-25 ENCOUNTER — Other Ambulatory Visit: Payer: Self-pay

## 2021-03-25 VITALS — BP 102/68 | HR 81 | Ht 71.0 in | Wt 302.6 lb

## 2021-03-25 DIAGNOSIS — M25532 Pain in left wrist: Secondary | ICD-10-CM

## 2021-03-25 DIAGNOSIS — R202 Paresthesia of skin: Secondary | ICD-10-CM | POA: Diagnosis not present

## 2021-03-25 DIAGNOSIS — G8929 Other chronic pain: Secondary | ICD-10-CM

## 2021-03-25 MED ORDER — PREGABALIN 50 MG PO CAPS: 50.0000 mg | ORAL_CAPSULE | Freq: Every evening | ORAL | 0 refills | Status: DC | PRN

## 2021-03-25 NOTE — Progress Notes (Signed)
Subjective:    CC: L hand, forearm and leg pain  I, Molly Weber, LAT, ATC, am serving as scribe for Dr. Lynne Leader.  HPI: Pt is a 66 y/o male presenting w/ c/o L hand, shoulder and leg pain.  L forearm and hand pain: Pt had a bipass surgery and they took the artery from the L forearm in 2005. Locates pain to anterior aspect of the L forearm, into L wrist, w/ paresthesia in the L hand and fingers..  Seen by PCP for this c/o on 02/12/21 and diagnosed w/ possible CTS and prescribed a medrol dose pack. -Swelling: no- but was previously - Numbness/tingling: yes- L hand and fingers -Aggravating factors: elbow flexion, putting pressure on arm -Treatments tried: medrol dose pack;  Leg pain / Low back pain ongoing for years: Locates pain to L side of his low back . -Radiating pain: yes- distally to L foot -LE numbness/tingling: yes -Aggravating factors: standing, walking -Treatments tried: Medrol dosepack; Tylenol;   Diagnostic testing: L hand XR- 06/06/19;   Pertinent review of Systems: No fevers or chills  Relevant historical information: PVD.  CAD.  History of bypass.  Liver cirrhosis.   Objective:    Vitals:   03/25/21 0949  BP: 102/68  Pulse: 81  SpO2: 90%   General: Well Developed, well nourished, and in no acute distress.   MSK: Left arm mature scar volar forearm.  Otherwise normal. Normal elbow and wrist motion. Positive Tinel's carpal tunnel producing paresthesias of palmar hand Pressure over volar forearm causes dorsal hand paresthesias. Pain present and mildly in lateral dorsal forearm with resisted wrist and finger extension. Grip strength is intact. Sensation is intact distally.    Impression and Recommendations:    Assessment and Plan: 66 y.o. male with dorsal and palmar hand paresthesias.  This occurs in the setting of a history of surgery of his forearm where an artery or vein was taken from has forearm.  I think it is possible that the superficial  branch of the radial nerve was injured or irritated by scar tissue after this.  I think it is also likely is carpal tunnel syndrome.  He has both dorsal and hand paresthesias which is unusual.  His symptoms been ongoing for over 10 years now and are not improving with typical conservative management strategies.  Plan for hand therapy as initial treatment strategy as well as a wrist brace at bedtime.  Additionally Lyrica at bedtime as he was not tolerant of gabapentin during the day and at bedtime.  Additionally we will proceed with a nerve conduction study to help sort through the location and distribution of his various nerve issues was left forearm.  Recheck after nerve conduction study results are back.  This can potentially help with potential targeted injections if needed.   His back was deferred today.  PDMP not reviewed this encounter. Orders Placed This Encounter  Procedures   Ambulatory referral to Physical Therapy    Referral Priority:   Routine    Referral Type:   Physical Medicine    Referral Reason:   Specialty Services Required    Requested Specialty:   Physical Therapy    Number of Visits Requested:   1   Ambulatory referral to Neurology    Referral Priority:   Routine    Referral Type:   Consultation    Referral Reason:   Specialty Services Required    Requested Specialty:   Neurology    Number of Visits Requested:  1   Meds ordered this encounter  Medications   pregabalin (LYRICA) 50 MG capsule    Sig: Take 1 capsule (50 mg total) by mouth at bedtime as needed (nerve pain).    Dispense:  30 capsule    Refill:  0    Discussed warning signs or symptoms. Please see discharge instructions. Patient expresses understanding.   The above documentation has been reviewed and is accurate and complete Lynne Leader, M.D.

## 2021-03-25 NOTE — Patient Instructions (Addendum)
Thank you for coming in today.   I've referred you to hand therapy. Their office will call you to schedule but please let us know if you haven't heard from them in one week regarding scheduling.  Please go to [facility] supply to get the wrist brace we talked about today. You may also be able to get it from [employer].    I've also referred you to Neurology for a nerve conduction test.  Their office will call you to schedule.  Follow-up: after nerve conduction test

## 2021-04-02 DIAGNOSIS — M25531 Pain in right wrist: Secondary | ICD-10-CM | POA: Diagnosis not present

## 2021-04-02 DIAGNOSIS — M25611 Stiffness of right shoulder, not elsewhere classified: Secondary | ICD-10-CM | POA: Diagnosis not present

## 2021-04-02 DIAGNOSIS — R202 Paresthesia of skin: Secondary | ICD-10-CM | POA: Diagnosis not present

## 2021-04-02 DIAGNOSIS — M25612 Stiffness of left shoulder, not elsewhere classified: Secondary | ICD-10-CM | POA: Diagnosis not present

## 2021-04-02 DIAGNOSIS — E119 Type 2 diabetes mellitus without complications: Secondary | ICD-10-CM | POA: Diagnosis not present

## 2021-04-02 DIAGNOSIS — M25532 Pain in left wrist: Secondary | ICD-10-CM | POA: Diagnosis not present

## 2021-04-02 DIAGNOSIS — M6281 Muscle weakness (generalized): Secondary | ICD-10-CM | POA: Diagnosis not present

## 2021-04-03 DIAGNOSIS — M25532 Pain in left wrist: Secondary | ICD-10-CM | POA: Diagnosis not present

## 2021-04-03 DIAGNOSIS — M6281 Muscle weakness (generalized): Secondary | ICD-10-CM | POA: Diagnosis not present

## 2021-04-03 DIAGNOSIS — M25531 Pain in right wrist: Secondary | ICD-10-CM | POA: Diagnosis not present

## 2021-04-03 DIAGNOSIS — R202 Paresthesia of skin: Secondary | ICD-10-CM | POA: Diagnosis not present

## 2021-04-03 DIAGNOSIS — E119 Type 2 diabetes mellitus without complications: Secondary | ICD-10-CM | POA: Diagnosis not present

## 2021-04-03 DIAGNOSIS — M25612 Stiffness of left shoulder, not elsewhere classified: Secondary | ICD-10-CM | POA: Diagnosis not present

## 2021-04-03 DIAGNOSIS — M25611 Stiffness of right shoulder, not elsewhere classified: Secondary | ICD-10-CM | POA: Diagnosis not present

## 2021-04-09 DIAGNOSIS — M25612 Stiffness of left shoulder, not elsewhere classified: Secondary | ICD-10-CM | POA: Diagnosis not present

## 2021-04-09 DIAGNOSIS — M6281 Muscle weakness (generalized): Secondary | ICD-10-CM | POA: Diagnosis not present

## 2021-04-09 DIAGNOSIS — R202 Paresthesia of skin: Secondary | ICD-10-CM | POA: Diagnosis not present

## 2021-04-09 DIAGNOSIS — M25611 Stiffness of right shoulder, not elsewhere classified: Secondary | ICD-10-CM | POA: Diagnosis not present

## 2021-04-09 DIAGNOSIS — E119 Type 2 diabetes mellitus without complications: Secondary | ICD-10-CM | POA: Diagnosis not present

## 2021-04-09 DIAGNOSIS — M25531 Pain in right wrist: Secondary | ICD-10-CM | POA: Diagnosis not present

## 2021-04-09 DIAGNOSIS — M25532 Pain in left wrist: Secondary | ICD-10-CM | POA: Diagnosis not present

## 2021-04-11 ENCOUNTER — Telehealth: Payer: Self-pay | Admitting: Pharmacist

## 2021-04-11 NOTE — Progress Notes (Signed)
? ? ?Chronic Care Management ?Pharmacy Assistant  ? ?Name: [name] [name]  MRN: [id number] DOB: [birth year] ? ?Reason for Encounter: Chart Review For Initial Visit With Clinical Pharmacist ?  ?Conditions to be addressed/monitored: ?HTN, CAD, CHF, DM II, HLD ? ?Primary concerns for visit include: ?HTN, DM II  ? ?Recent office visits:  ?02/12/2021 OV (PCP) [name], MD; Start Medrol Dosepak.  Will place referral to sports medicine for further evaluation management ? ?12/04/2020 VV (PCP) [name], MD;  We will send in guaifenesin-codeine.  He will need to follow-up with pulmonology soon.  They are avoiding Hycodan cough syrup ? ?11/23/2020 VV (PCP) [name], MD; We will give small amount of promethazine-codeine cough syrup.  This has been prescribed to him in the past and he is done well with it.  He will need to follow back up with pulmonology if symptoms persist. ? ?Recent consult visits:  ?03/25/2021 OV (Sports Medicine) Gregor Hams, MD; no medication changes indicated. ? ?01/16/2021 OV (Pulmonology) Baird Lyons D, MD; sample Trelegy 100    inhale 1 puff, then rinse mouth, once daily. See if this helps the cough. Please get your CPAP machine and your nebulizer machine out of storage and get back to using them ? ?12/17/2020 OV (Pulmonology) Martyn Ehrich, NP; Advised he take mucinex 1200mg  twice daily x 1-2 weeks or until cough resolved. Start prednisone taper as directed. Recommend checking PFTs and sputum culture. Repeat imaging today. - Add zyrtec 10mg  daily and Saline ocean spray twice daily  ? ?11/06/2020 OV (Cardiology) [name] T, PA-C; no medication changes indicated. ? ?10/17/2020 OV (Pulmonology) Martyn Ehrich, NP; We will try to keep him of hycodan cough syrup. Recommend he try OTC Delsym, continue mucinex 600mg  BID for congestion and take tesslon perles 200mg  TID as needed for cough. ? ?Hospital visits:  ?None in previous 6 months ? ?Medications: ?Outpatient  Encounter Medications as of 04/11/2021  ?Medication Sig  ? aspirin EC 81 MG tablet Take 81 mg by mouth daily.  ? B-D ULTRAFINE III SHORT PEN 31G X 8 MM MISC   ? benzonatate (TESSALON) 100 MG capsule Take 2 capsules (200 mg total) by mouth 3 (three) times daily.  ? chlorhexidine (HIBICLENS) 4 % external liquid Apply topically daily as needed.  ? collagenase (SANTYL) ointment Apply 1 application topically daily. Right ankle measures 6.0 x 0.3cm.  ? diclofenac Sodium (VOLTAREN) 1 % GEL APPLY 2 GRAMS TOPICALLY 4 TIMES DAILY TO KNEE  ? eplerenone (INSPRA) 25 MG tablet TAKE 1 TABLET(25 MG) BY MOUTH DAILY  ? famotidine (PEPCID) 20 MG tablet TAKE 1 TABLET(20 MG) BY MOUTH DAILY  ? fluticasone (FLONASE) 50 MCG/ACT nasal spray Place 1 spray into both nostrils daily.  ? Fluticasone-Umeclidin-Vilant (TRELEGY ELLIPTA) 100-62.5-25 MCG/ACT AEPB Inhale 1 puff into the lungs daily. (Patient not taking: Reported on 02/12/2021)  ? glucose blood (ONETOUCH ULTRA) test strip Use as instructed  ? guaiFENesin-codeine 100-10 MG/5ML syrup Take 5 mLs by mouth 3 (three) times daily as needed for cough. (Patient not taking: Reported on 03/25/2021)  ? HUMULIN R U-500 KWIKPEN 500 UNIT/ML kwikpen Inject 50-60 Units into the skin See admin instructions. 60 units in the AM, 50 units at lunch and 50 in the evening  ? ipratropium-albuterol (DUONEB) 0.5-2.5 (3) MG/3ML SOLN 1 vial in nebulizer up to 4 times daily  ? Lancets (ACCU-CHEK MULTICLIX) lancets   ? LANTUS SOLOSTAR 100 UNIT/ML Solostar Pen Inject 55 Units into the skin every  morning.  ? losartan (COZAAR) 50 MG tablet Take 0.5 tablets (25 mg total) by mouth daily.  ? MAGNESIUM-OXIDE 400 (241.3 Mg) MG tablet Take 1 tablet by mouth 2 (two) times daily.  ? methylPREDNISolone (MEDROL DOSEPAK) 4 MG TBPK tablet 6-5-4-3-2-1-OFF (Patient not taking: Reported on 03/25/2021)  ? metoprolol tartrate (LOPRESSOR) 50 MG tablet TAKE 1 TABLET(50 MG) BY MOUTH TWICE DAILY  ? nitroGLYCERIN (NITROSTAT) 0.4 MG SL tablet  Place 1 tablet (0.4 mg total) under the tongue every 5 (five) minutes as needed for chest pain.  ? nystatin ointment (MYCOSTATIN) Apply 1 application topically 2 (two) times daily.  ? polyethylene glycol (MIRALAX / GLYCOLAX) 17 g packet Take 17 g by mouth daily.  ? potassium chloride SA (KLOR-CON) 20 MEQ tablet TAKE 2 TABLETS(40 MEQ) BY MOUTH THREE TIMES DAILY  ? pregabalin (LYRICA) 50 MG capsule Take 1 capsule (50 mg total) by mouth at bedtime as needed (nerve pain).  ? PROAIR HFA 108 (90 Base) MCG/ACT inhaler INHALE 2 PUFFS INTO THE LUNGS EVERY 6 HOURS AS NEEDED FOR WHEEZING OR SHORTNESS OF BREATH  ? rosuvastatin (CRESTOR) 20 MG tablet Take 1 tablet (20 mg total) by mouth every evening.  ? silver sulfADIAZINE (SSD) 1 % cream APPLY PEA SIZED AMOUNT TOPICALLY TO WOUND DAILY  ? simvastatin (ZOCOR) 80 MG tablet Take 1/2 tablet BY MOUTH ( 40 mg total) one time daily  ? torsemide (DEMADEX) 20 MG tablet TAKE 1 TABLET(20 MG) BY MOUTH TWICE DAILY  ? ?No facility-administered encounter medications on file as of 04/11/2021.  ? ?Current Medications: ?Lyrica 50 mg last filled 03/25/2021 30 DS ?Onetouch Ultra Test Strip last filled 03/28/2021 90 DS ?Famotidine 20 mg last filled 02/14/2021 90 DS ?Chlorhexidine 4% ?Medrol Dosepak last filled 02/12/2021 6 DS ?Trelegy Ellipta last filled 03/18/2021 30 DS ?Silver Sulfadiazine 1% cream last filled 01/07/2021 20 DS ?ProAir HFA last filled 11/10/2020 25 DS ?Guaifenesin-codeine 100-10 mg/45mL last filled 12/04/2020 8 DS ?Fluticasone 50 mcg/act nasal spray last filled 11/23/2020 60 DS ?Eplerenone 25 mg last filled 01/21/2021 90 DS ?Metoprolol Tartrate 50 mg last filled 02/15/2021 90 DS ?Ipratopium-albuterol 0.5-2.5 mg/mL ?Torsemide 20 mg last filled 02/07/2021 90 DS ?Losartan 50 mg last filled 01/26/2021 90 DS ?Nitroglycerin 0.4 mg last filled 11/09/2020 10 DS ?Simvastatin 80 mg ?Benzonatate 100 mg ?Potassium chloride 20 meq last filled 08/26/2020 30 DS ?Diclofenac sodium last filled  01/08/2021 7 DS ?Humulin U-500 last filled 01/13/2020 9 DS ?Nystatin ointment last filled 10/31/2019 15 DS ?Magnesium-oxide 400 mg last filled 10/06/2019 30 DS ?Lantus Solostar last filled 03/25/2021 96 DS ?Miralax 17g packet ?Collagenase ointment last filled 07/23/2019 90 DS ?Rosuvastatin 20 mg last filled 01/29/2021 90 DS ?B-D ultrafine III short pen last filled 02/08/2021 90 DS ?Accu-chek multiclix ?Aspirin 81 mg ?Humalog 100 u 04/03/2021 90 DS ? ?Patient Questions: ?Any changes in your medications or health? ? ?Any side effects from any medications?  ? ?Do you have any symptoms or problems not managed by your medications? ? ?Any concerns about your health right now? ? ?Has your provider asked that you check blood pressure, blood sugar, or follow special diet at home? ? ?Do you get any type of exercise on a regular basis? ? ?Can you think of a goal you would like to reach for your health? ? ?Do you have any problems getting your medications? ? ?Is there anything that you would like to discuss during the appointment?  ? ?Please bring medications and supplements to appointment ? ?**Patient cancelled this appointment. He  rescheduled for 05/14/2021 at 9:00 am.** ? ?Care Gaps: ?Medicare Annual Wellness: Completed 11/15/2020 ?Ophthalmology Exam: Overdue since 03/13/2021 ?Foot Exam: Next due on 11/02/2021 ?Hemoglobin A1C: 10.6% on 02/10/2020 ?Colonoscopy: Overdue - never done ? ?Future Appointments  ?Date Time Provider [place]  ?04/16/2021 11:00 AM [facility] CCM PHARMACIST [facility]  ?04/19/2021  9:00 AM Young, Kasandra Knudsen, MD [facility] None  ?05/09/2021  9:40 AM [name], MD [facility]  ?11/28/2021  9:30 AM [facility] HEALTH COACH [facility]  ?01/16/2022 11:00 AM Deneise Lever, MD [facility] None  ? ? ?Star Rating Drugs: ? ?April D Cal[place]oun, [place] ?Clinical Pharmacist Assistant ?[phone]  ?

## 2021-04-16 ENCOUNTER — Telehealth: Payer: Commercial Managed Care - HMO

## 2021-04-16 DIAGNOSIS — M25612 Stiffness of left shoulder, not elsewhere classified: Secondary | ICD-10-CM | POA: Diagnosis not present

## 2021-04-16 DIAGNOSIS — M6281 Muscle weakness (generalized): Secondary | ICD-10-CM | POA: Diagnosis not present

## 2021-04-16 DIAGNOSIS — M25611 Stiffness of right shoulder, not elsewhere classified: Secondary | ICD-10-CM | POA: Diagnosis not present

## 2021-04-16 DIAGNOSIS — M25531 Pain in right wrist: Secondary | ICD-10-CM | POA: Diagnosis not present

## 2021-04-16 DIAGNOSIS — R202 Paresthesia of skin: Secondary | ICD-10-CM | POA: Diagnosis not present

## 2021-04-16 DIAGNOSIS — M25532 Pain in left wrist: Secondary | ICD-10-CM | POA: Diagnosis not present

## 2021-04-16 DIAGNOSIS — E119 Type 2 diabetes mellitus without complications: Secondary | ICD-10-CM | POA: Diagnosis not present

## 2021-04-18 NOTE — Progress Notes (Deleted)
HPI ? male former smoker followed for OSA, chronic bronchitis, SAR, CAD/MI/CABG/ CHF, HBP, DM, cirrhosis liver, ?Continues oxygen 2 L for sleep/ [employer]. ?PFT 06/18/2011-moderate restriction. Flows are normal for measure volume with FEV1/FVC 0.84 and insignificant response to bronchodilator. DLCO 52%. ?HST-08/05/2016-AHI 37.5/hour, desaturation to 67%, body weight 294 pounds ?HST 09/08/18- AHI 32.4/ hr, desaturation to 73%- avg 89%, body weight 298 lbs ?------------------------------------------------------------------------------- ? ? ? ?01/16/21-  66 year old male former smoker(45 pk yrs) followed for OSA, chronic bronchitis, SAR, CAD/MI/CABG, dCHF, DM2, PVD, hepatic cirrhosis, Morbid Obesity, Covid infection July 2022,  ?CPAP auto 5-20/ [employer]  Ordered 11/01/2018 ?Download- compliance ?Body weight today-309 lbs ?Covid vax-2 phizer                          ?Flu vax-had ?LOV NP Volanda Napoleon 11/7- bronchitis, rhinitis> flonase, zyrtec, saline spray, pred taper, mucinex, PFT ?-----C/o cough-white since July 2022 with covid ?PFT scheduled for 12/19. Not using CPAP or O2- in storage since Spring. ?He says his nebulizer machine and his CPAP machine are in the back of the storage unit since he moved.  I emphasized that it was time to go in and pull those out, resume CPAP use and get ready for winter. ?He describes some persistent cough with scant white sputum since COVID infection in July.  Cough is noticed most when lying down or if he feels hot.  He does not seem to be coughing with food or drink and he does not notice wheeze. ?He indicates a lymph node under the angle of right mandible that he can feel sometimes, nontender.  I have asked him to point this out to his primary physician.  He does not think it is growing and is not aware of any others. ?CXR 12/17/20 ?IMPRESSION: ?Central pulmonary vascular prominence and persistent mild central ?peribronchial thickening. No new airspace disease. ? ?04/19/21- 66 year old male former  smoker(45 pk yrs) followed for OSA, chronic bronchitis, SAR, CAD/MI/CABG, dCHF, DM2, PVD, hepatic cirrhosis, Morbid Obesity, Covid infection July 2022,  ?CPAP auto 5-20/ [facility]  Ordered 11/01/2018 ?Download- compliance ?Body weight today- ?Covid vax-2 phizer                          ?Flu vax-had ? ?ROS-see HPI   + = positive ?Constitutional:   No-   weight loss, night sweats, fevers, chills, + fatigue, lassitude. ?HEENT:   No-  headaches, difficulty swallowing, tooth/dental problems, sore throat,  ?     No-  sneezing, itching, ear ache, nasal congestion, post nasal drip,  ?CV:  No-   chest pain, orthopnea, PND, + swelling in lower extremities, anasarca,  dizziness, palpitations ?Resp: + Shortness of breath with exertion or at rest.   ?           No-   productive cough,  No non-productive cough,  No- coughing up of blood.   ?           No-   change in color of mucus.  + Occasional wheezing.   ?Skin: No-   rash or lesions. ?GI:  No-   heartburn, indigestion, abdominal pain, nausea, vomiting,  ?GU: . ?MS:  No-   joint pain or swelling.   ?Neuro-     nothing unusual ?Psych:  No- change in mood or affect. No depression or anxiety.  No memory loss. ? ? ?OBJ- Physical Exam ?General- Alert, Oriented, Affect-appropriate, Distress- none acute, +obese,  ?Skin- +stasis  changes legs, + Increased skin turgor? Edema ?Lymphadenopathy- + thick neck.  I feel some fullness in the area indicated and have asked him to show this to his PCP. ?Head- atraumatic ?           Eyes- Gross vision intact, PERRLA, conjunctivae and secretions clear ?           Ears- Hearing, canals-normal ?           Nose- Clear, no-Septal dev, mucus, polyps, erosion, perforation  ?           Throat- Mallampati IV , mucosa clear , drainage- none, tonsils- atrophic, +missing teeth ?Neck- flexible , trachea midline, no stridor , thyroid nl, carotid no bruit ?Chest - symmetrical excursion , unlabored ?          Heart/CV- RRR , no murmur , no gallop  , no rub, nl s1  s2 ?                          - JVD- none , edema- none, stasis changes- none, varices- none ?          Lung- clear to P&A, wheeze- none, cough- none , dullness-none, rub- none ?          Chest wall- sternotomy scar ?Abd-  ?Br/ Gen/ Rectal- Not done, not indicated ?Extrem- cyanosis- none, clubbing, none, atrophy- none, strength- nl. Vascular graft donor site scar left forearm. + Cane, + chronic brawny peripheral edema w stasis changes in calves. ?Neuro- grossly intact to observation ? ? ? ? ?

## 2021-04-19 ENCOUNTER — Ambulatory Visit: Payer: Medicare Other | Admitting: Internal Medicine

## 2021-04-23 DIAGNOSIS — M25612 Stiffness of left shoulder, not elsewhere classified: Secondary | ICD-10-CM | POA: Diagnosis not present

## 2021-04-23 DIAGNOSIS — M6281 Muscle weakness (generalized): Secondary | ICD-10-CM | POA: Diagnosis not present

## 2021-04-23 DIAGNOSIS — M25532 Pain in left wrist: Secondary | ICD-10-CM | POA: Diagnosis not present

## 2021-04-23 DIAGNOSIS — M25531 Pain in right wrist: Secondary | ICD-10-CM | POA: Diagnosis not present

## 2021-04-23 DIAGNOSIS — R202 Paresthesia of skin: Secondary | ICD-10-CM | POA: Diagnosis not present

## 2021-04-23 DIAGNOSIS — M25611 Stiffness of right shoulder, not elsewhere classified: Secondary | ICD-10-CM | POA: Diagnosis not present

## 2021-04-23 DIAGNOSIS — E119 Type 2 diabetes mellitus without complications: Secondary | ICD-10-CM | POA: Diagnosis not present

## 2021-04-25 DIAGNOSIS — M25532 Pain in left wrist: Secondary | ICD-10-CM | POA: Diagnosis not present

## 2021-04-25 DIAGNOSIS — R202 Paresthesia of skin: Secondary | ICD-10-CM | POA: Diagnosis not present

## 2021-04-25 DIAGNOSIS — M25611 Stiffness of right shoulder, not elsewhere classified: Secondary | ICD-10-CM | POA: Diagnosis not present

## 2021-04-25 DIAGNOSIS — M6281 Muscle weakness (generalized): Secondary | ICD-10-CM | POA: Diagnosis not present

## 2021-04-25 DIAGNOSIS — M25531 Pain in right wrist: Secondary | ICD-10-CM | POA: Diagnosis not present

## 2021-04-25 DIAGNOSIS — E119 Type 2 diabetes mellitus without complications: Secondary | ICD-10-CM | POA: Diagnosis not present

## 2021-04-25 DIAGNOSIS — M25612 Stiffness of left shoulder, not elsewhere classified: Secondary | ICD-10-CM | POA: Diagnosis not present

## 2021-04-30 DIAGNOSIS — M6281 Muscle weakness (generalized): Secondary | ICD-10-CM | POA: Diagnosis not present

## 2021-04-30 DIAGNOSIS — M25532 Pain in left wrist: Secondary | ICD-10-CM | POA: Diagnosis not present

## 2021-04-30 DIAGNOSIS — M25531 Pain in right wrist: Secondary | ICD-10-CM | POA: Diagnosis not present

## 2021-04-30 DIAGNOSIS — R202 Paresthesia of skin: Secondary | ICD-10-CM | POA: Diagnosis not present

## 2021-04-30 DIAGNOSIS — M25611 Stiffness of right shoulder, not elsewhere classified: Secondary | ICD-10-CM | POA: Diagnosis not present

## 2021-04-30 DIAGNOSIS — M25612 Stiffness of left shoulder, not elsewhere classified: Secondary | ICD-10-CM | POA: Diagnosis not present

## 2021-04-30 DIAGNOSIS — E119 Type 2 diabetes mellitus without complications: Secondary | ICD-10-CM | POA: Diagnosis not present

## 2021-05-02 DIAGNOSIS — M25532 Pain in left wrist: Secondary | ICD-10-CM | POA: Diagnosis not present

## 2021-05-02 DIAGNOSIS — M6281 Muscle weakness (generalized): Secondary | ICD-10-CM | POA: Diagnosis not present

## 2021-05-02 DIAGNOSIS — M25611 Stiffness of right shoulder, not elsewhere classified: Secondary | ICD-10-CM | POA: Diagnosis not present

## 2021-05-02 DIAGNOSIS — R202 Paresthesia of skin: Secondary | ICD-10-CM | POA: Diagnosis not present

## 2021-05-02 DIAGNOSIS — M25612 Stiffness of left shoulder, not elsewhere classified: Secondary | ICD-10-CM | POA: Diagnosis not present

## 2021-05-02 DIAGNOSIS — E119 Type 2 diabetes mellitus without complications: Secondary | ICD-10-CM | POA: Diagnosis not present

## 2021-05-02 DIAGNOSIS — M25531 Pain in right wrist: Secondary | ICD-10-CM | POA: Diagnosis not present

## 2021-05-06 ENCOUNTER — Telehealth: Payer: Self-pay

## 2021-05-06 NOTE — Telephone Encounter (Signed)
Have you scheduled your nerve conduction study yet?  This should be the next step but I do not think that is been done yet. ? ?Additionally why cannot you take the Lyrica with your heart issue?  What is the exact reason? ? ?Generally I think this is a reasonable medicine to take even if you do have some heart disease.  But I may be wrong. ?

## 2021-05-06 NOTE — Telephone Encounter (Signed)
Patient called stating that he is still having pain in his hand and arm on left side is still doing PT but is unable to take  the lyrica due to having heart issues? Patient would like a call back to discuss other medication options or what his next steps should be.  ?

## 2021-05-07 DIAGNOSIS — E119 Type 2 diabetes mellitus without complications: Secondary | ICD-10-CM | POA: Diagnosis not present

## 2021-05-07 DIAGNOSIS — M6281 Muscle weakness (generalized): Secondary | ICD-10-CM | POA: Diagnosis not present

## 2021-05-07 DIAGNOSIS — M25611 Stiffness of right shoulder, not elsewhere classified: Secondary | ICD-10-CM | POA: Diagnosis not present

## 2021-05-07 DIAGNOSIS — M25612 Stiffness of left shoulder, not elsewhere classified: Secondary | ICD-10-CM | POA: Diagnosis not present

## 2021-05-07 DIAGNOSIS — R202 Paresthesia of skin: Secondary | ICD-10-CM | POA: Diagnosis not present

## 2021-05-07 DIAGNOSIS — M25532 Pain in left wrist: Secondary | ICD-10-CM | POA: Diagnosis not present

## 2021-05-07 DIAGNOSIS — M25531 Pain in right wrist: Secondary | ICD-10-CM | POA: Diagnosis not present

## 2021-05-07 NOTE — Telephone Encounter (Signed)
Pt was provided the phone number to call [facility] to schedule his NCV study. ?Pt had no explanation as to why he couldn't take the Lyrica due to his hx of heart issues. He said that any time he's taking an "anti-inflammatory" that he's supposed to check with his cardiologist first.  ?

## 2021-05-08 NOTE — Progress Notes (Signed)
?Cardiology Office Note:   ? ?Date:  05/09/2021  ? ?ID:  [name] [name], DOB 10-Jul-1955, MRN [id number] ? ?PCP:  [name] Barrack, MD  ?[facility] Cardiologist:  [name] Furbish, MD  ?[facility] Electrophysiologist:  None  ? ?Referring MD: [name] Barrack, MD  ? ? ?History of Present Illness:   ? ?[name] [name] is a 66 y.o. male here for follow-up of coronary artery disease, congestive heart failure, and hypertension. ? ?Since his last visit he followed up with [name] Dopp, PA-C on 11/06/20. Noted to have some increased right LE swelling, which was unusual for him (usually worse in left). He was instructed to take 1 extra dose of 20 mg torsemide that day. A right LE venous doppler was ordered to rule out DVT, performed 9/28 and was negative. ? ?CAD status post CABG in 2005.  Previously doing quite well minor shortness of breath with activity.  NYHA class II.  Has had prior ectopic atrial rhythm.  Dr. Curt [name] has reviewed in the past.  On metoprolol.  EF low normal. ? ?Also has right bundle branch block, obstructive sleep apnea.  Dr. Annamaria [name]. ? ?At his last appointment he was doing well aside from battling with a little bit of indigestion. ? ?He is accompanied by a family member. Today, he is feeling alright aside from seasonal allergies. He denies any recent issues with chest pain. ? ?When he walks using his walker, he generally does well. If he uses his cane, he has more difficulty walking and becomes more short of breath. He complains of some ongoing LLE pain. Recently it was recommended he start Lyrica, but he wanted to check with his cardiologist prior to starting any anti-inflammatory medications. ? ?His weight is down since his last appointment. Lately drinking sodas is his weakness. They continue to work on dietary changes including less processed meats and salty foods. ? ?He denies any palpitations, or peripheral edema. No lightheadedness, headaches, syncope, orthopnea, or PND. ? ? ?Past Medical History:   ?Diagnosis Date  ? Acute myocardial infarction, unspecified site, episode of care unspecified 2005  ? Acute pancreatitis   ? CAD (coronary artery disease)   ? a. CABG in 2005 w LIMA to LAD, left radial to second circumflex marginal, saphenous vein graft to PDA, saphenous vein graft to lateral subbrach of ramus intermediate, and sequential saphenous vein graft to the medial subbranch of ramus intermediate.  ? Chronic diastolic CHF (congestive heart failure) ([place])   ? Cirrhosis of liver without mention of alcohol   ? CKD (chronic kidney disease), stage II   ? Complications affecting other specified body systems, hypertension   ? Ectopic atrial rhythm   ? Essential hypertension   ? Hyperlipidemia   ? Hypokalemia   ? a. intermitttent noncompliance with potassium supplement.  ? Hypomagnesemia   ? Morbid obesity ([place])   ? Neuropathy   ? Other and unspecified hyperlipidemia   ? Proteinuria   ? RBBB   ? Sleep apnea   ? Type II or unspecified type diabetes mellitus without mention of complication, not stated as uncontrolled   ? Varicose veins of both lower extremities   ? ? ?Past Surgical History:  ?Procedure Laterality Date  ? CORONARY ARTERY BYPASS GRAFT    ? 2005  ? I & D EXTREMITY Left 09/02/2019  ? Procedure: IRRIGATION AND DEBRIDEMENT 4th TOE [place];  Surgeon: Evelina Bucy, DPM;  Location: [facility];  Service: Podiatry;  Laterality: Left;  ? ? ?Current Medications: ?  Current Meds  ?Medication Sig  ? aspirin EC 81 MG tablet Take 81 mg by mouth daily.  ? B-D ULTRAFINE III SHORT PEN 31G X 8 MM MISC   ? benzonatate (TESSALON) 100 MG capsule Take 2 capsules (200 mg total) by mouth 3 (three) times daily.  ? chlorhexidine (HIBICLENS) 4 % external liquid Apply topically daily as needed.  ? collagenase (SANTYL) ointment Apply 1 application topically daily. Right ankle measures 6.0 x 0.3cm.  ? diclofenac Sodium (VOLTAREN) 1 % GEL APPLY 2 GRAMS TOPICALLY 4 TIMES DAILY TO KNEE  ? eplerenone (INSPRA) 25 MG tablet TAKE 1  TABLET(25 MG) BY MOUTH DAILY  ? famotidine (PEPCID) 20 MG tablet TAKE 1 TABLET(20 MG) BY MOUTH DAILY  ? fluticasone (FLONASE) 50 MCG/ACT nasal spray Place 1 spray into both nostrils daily.  ? Fluticasone-Umeclidin-Vilant (TRELEGY ELLIPTA) 100-62.5-25 MCG/ACT AEPB Inhale 1 puff into the lungs daily.  ? glucose blood (ONETOUCH ULTRA) test strip Use as instructed  ? guaiFENesin-codeine 100-10 MG/5ML syrup Take 5 mLs by mouth 3 (three) times daily as needed for cough.  ? HUMULIN R U-500 KWIKPEN 500 UNIT/ML kwikpen Inject 50-60 Units into the skin See admin instructions. 60 units in the AM, 50 units at lunch and 50 in the evening  ? ipratropium-albuterol (DUONEB) 0.5-2.5 (3) MG/3ML SOLN 1 vial in nebulizer up to 4 times daily  ? Lancets (ACCU-CHEK MULTICLIX) lancets   ? LANTUS SOLOSTAR 100 UNIT/ML Solostar Pen Inject 55 Units into the skin every morning.  ? losartan (COZAAR) 50 MG tablet Take 0.5 tablets (25 mg total) by mouth daily.  ? MAGNESIUM-OXIDE 400 (241.3 Mg) MG tablet Take 1 tablet by mouth 2 (two) times daily.  ? metoprolol tartrate (LOPRESSOR) 50 MG tablet TAKE 1 TABLET(50 MG) BY MOUTH TWICE DAILY  ? nitroGLYCERIN (NITROSTAT) 0.4 MG SL tablet Place 1 tablet (0.4 mg total) under the tongue every 5 (five) minutes as needed for chest pain.  ? nystatin ointment (MYCOSTATIN) Apply 1 application topically 2 (two) times daily.  ? polyethylene glycol (MIRALAX / GLYCOLAX) 17 g packet Take 17 g by mouth daily.  ? potassium chloride SA (KLOR-CON) 20 MEQ tablet TAKE 2 TABLETS(40 MEQ) BY MOUTH THREE TIMES DAILY  ? pregabalin (LYRICA) 50 MG capsule Take 1 capsule (50 mg total) by mouth at bedtime as needed (nerve pain).  ? PROAIR HFA 108 (90 Base) MCG/ACT inhaler INHALE 2 PUFFS INTO THE LUNGS EVERY 6 HOURS AS NEEDED FOR WHEEZING OR SHORTNESS OF BREATH  ? rosuvastatin (CRESTOR) 20 MG tablet Take 1 tablet (20 mg total) by mouth every evening.  ? silver sulfADIAZINE (SSD) 1 % cream APPLY PEA SIZED AMOUNT TOPICALLY TO WOUND  DAILY  ? simvastatin (ZOCOR) 80 MG tablet Take 1/2 tablet BY MOUTH ( 40 mg total) one time daily  ? torsemide (DEMADEX) 20 MG tablet TAKE 1 TABLET(20 MG) BY MOUTH TWICE DAILY  ?  ? ?Allergies:   Empagliflozin and Ibuprofen  ? ?Social History  ? ?Socioeconomic History  ? Marital status: Significant Other  ?  Spouse name: Not on file  ? Number of children: Not on file  ? Years of education: Not on file  ? Highest education level: Not on file  ?Occupational History  ? Occupation: Disabled   ?  Comment: previously worked in delivery   ?Tobacco Use  ? Smoking status: Former  ?  Packs/day: 1.50  ?  Years: 30.00  ?  Pack years: 45.00  ?  Types: Cigarettes  ?  Quit  date: 10/20/2004  ?  Years since quitting: 16.5  ? Smokeless tobacco: Never  ?Vaping Use  ? Vaping Use: Never used  ?Substance and Sexual Activity  ? Alcohol use: Yes  ?  Comment: occasional  ? Drug use: No  ? Sexual activity: Not on file  ?Other Topics Concern  ? Not on file  ?Social History Narrative  ? Lives with significant other x 30+ years, apartment "friends smoke". Disability. Is disabled secondary to his work-related injury.   ?   ? Has over 20 grandchildren   ? ?Social Determinants of Health  ? ?Financial Resource Strain: Low Risk   ? Difficulty of Paying Living Expenses: Not hard at all  ?Food Insecurity: No Food Insecurity  ? Worried About Charity fundraiser in the Last Year: Never true  ? Ran Out of Food in the Last Year: Never true  ?Transportation Needs: No Transportation Needs  ? Lack of Transportation (Medical): No  ? Lack of Transportation (Non-Medical): No  ?Physical Activity: Insufficiently Active  ? Days of Exercise per Week: 5 days  ? Minutes of Exercise per Session: 10 min  ?Stress: No Stress Concern Present  ? Feeling of Stress : Not at all  ?Social Connections: Socially Isolated  ? Frequency of Communication with Friends and Family: More than three times a week  ? Frequency of Social Gatherings with Friends and Family: More than three  times a week  ? Attends Religious Services: Never  ? Active Member of Clubs or Organizations: No  ? Attends Archivist Meetings: Never  ? Marital Status: Never married  ?  ? ?Family History: ?The patient

## 2021-05-09 ENCOUNTER — Encounter: Payer: Self-pay | Admitting: Cardiology

## 2021-05-09 ENCOUNTER — Ambulatory Visit (INDEPENDENT_AMBULATORY_CARE_PROVIDER_SITE_OTHER): Payer: Medicare Other | Admitting: Cardiology

## 2021-05-09 DIAGNOSIS — G5622 Lesion of ulnar nerve, left upper limb: Secondary | ICD-10-CM | POA: Diagnosis not present

## 2021-05-09 DIAGNOSIS — I83813 Varicose veins of bilateral lower extremities with pain: Secondary | ICD-10-CM | POA: Diagnosis not present

## 2021-05-09 DIAGNOSIS — E782 Mixed hyperlipidemia: Secondary | ICD-10-CM | POA: Diagnosis not present

## 2021-05-09 DIAGNOSIS — I739 Peripheral vascular disease, unspecified: Secondary | ICD-10-CM | POA: Diagnosis not present

## 2021-05-09 DIAGNOSIS — I25708 Atherosclerosis of coronary artery bypass graft(s), unspecified, with other forms of angina pectoris: Secondary | ICD-10-CM

## 2021-05-09 DIAGNOSIS — Z794 Long term (current) use of insulin: Secondary | ICD-10-CM | POA: Diagnosis not present

## 2021-05-09 DIAGNOSIS — E1169 Type 2 diabetes mellitus with other specified complication: Secondary | ICD-10-CM | POA: Diagnosis not present

## 2021-05-09 DIAGNOSIS — I5032 Chronic diastolic (congestive) heart failure: Secondary | ICD-10-CM

## 2021-05-09 NOTE — Assessment & Plan Note (Signed)
I am comfortable with him taking Lyrica if he needs to.  There was some question whether this would affect his heart.  I do not believe this is the case. ?

## 2021-05-09 NOTE — Assessment & Plan Note (Signed)
Stable left leg claudication.  Prior ABIs reviewed as above. ?

## 2021-05-09 NOTE — Assessment & Plan Note (Signed)
Prior right leg swelling.  Venous Dopplers right lower extremity was negative for DVT by Doppler ?

## 2021-05-09 NOTE — Assessment & Plan Note (Signed)
Prior LDL 75.  Okay to continue with simvastatin 40 mg.  If LDL remains above 70 in the future, consider Zetia. ?

## 2021-05-09 NOTE — Assessment & Plan Note (Signed)
Continue to encourage weight loss, decrease carbohydrates. ?

## 2021-05-09 NOTE — Assessment & Plan Note (Signed)
Takes torsemide 40 mg a day, eplerenone 25 mg a day.  Cannot take SGLT2 inhibitors because of prior yeast infection.  Stable.  EF 50 to 55%.  NYHA class IIb. ?

## 2021-05-09 NOTE — Assessment & Plan Note (Signed)
Managed by Dr. Buddy Duty.  Cannot take SGLT2 inhibitors because of yeast infection. ?

## 2021-05-09 NOTE — Patient Instructions (Addendum)
edication Instructions:  ? ?Your physician recommends that you continue on your current medications as directed. Please refer to the Current Medication list given to you today. ? ?*If you need a refill on your cardiac medications before your next appointment, please call your pharmacy* ? ? ?Lab Work:  CBC CMET LIPID HGA1C TODAY  ? ?If you have labs (blood work) drawn today and your tests are completely normal, you will receive your results only by: ?MyChart Message (if you have MyChart) OR ?A paper copy in the mail ?If you have any lab test that is abnormal or we need to change your treatment, we will call you to review the results. ? ? ?Testing/Procedures: NONE ORDERED  TODAY ? ? ? ?Follow-Up: ?At [facility], you and your health needs are our priority.  As part of our continuing mission to provide you with exceptional heart care, we have created designated Provider Care Teams.  These Care Teams include your primary Cardiologist (physician) and Advanced Practice Providers (APPs -  Physician Assistants and Nurse Practitioners) who all work together to provide you with the care you need, when you need it. ? ?We recommend signing up for the patient portal called "MyChart".  Sign up information is provided on this After Visit Summary.  MyChart is used to connect with patients for Virtual Visits (Telemedicine).  Patients are able to view lab/test results, encounter notes, upcoming appointments, etc.  Non-urgent messages can be sent to your provider as well.   ?To learn more about what you can do with MyChart, go to [email or web address].   ? ?Your next appointment:   ?6 month(s) ? ?The format for your next appointment:   ?In Person ? ?Provider:   ?[name], PA-C     { ? ? ?Other Instructions ? ?

## 2021-05-09 NOTE — Assessment & Plan Note (Addendum)
Prior CABG 2005 stress test in 2015 was low risk.  No angina currently.  LIMA to LAD, radial to second marginal, SVG to PDA.  SVG to ramus.  Continue with goal-directed medical therapy.  We will check a CBC, complete metabolic profile, hemoglobin A1c as well as lipid panel. ?

## 2021-05-12 ENCOUNTER — Telehealth: Payer: Self-pay | Admitting: Physician Assistant

## 2021-05-12 NOTE — Telephone Encounter (Signed)
? ?  Dr. Gasper Sells got a call about K 2.7 on recent labs, asked me to assist. Per chart review,  Dr. Marlou Porch already addressed potassium plan in his note from 3/30. Patient was not taking supplementation as prescribed at that time and was encouraged to take as prescribed. (I am familiar with patient from remote visits in which he historically was not compliant with his potassium repletion at all therefore the plan outlined is very reasonable.) Will route to Dr. Marlou Porch to let him know of the call placed to [place] - do not see plan for repeat BMET, unsure if to be followed by [place] or PCP. Dr. Marlou Porch, please route any repeat lab recs to nursing staff or triage to help set up if desired. ? ?Charlie Pitter, PA-C ? ?

## 2021-05-13 ENCOUNTER — Telehealth: Payer: Self-pay | Admitting: Pharmacist

## 2021-05-13 LAB — COMPREHENSIVE METABOLIC PANEL
ALT: 13 IU/L (ref 0–44)
AST: 15 IU/L (ref 0–40)
Albumin/Globulin Ratio: 1 — ABNORMAL LOW (ref 1.2–2.2)
Albumin: 3.8 g/dL (ref 3.8–4.8)
Alkaline Phosphatase: 96 IU/L (ref 44–121)
BUN/Creatinine Ratio: 10 (ref 10–24)
BUN: 14 mg/dL (ref 8–27)
Bilirubin Total: 1 mg/dL (ref 0.0–1.2)
CO2: 31 mmol/L — ABNORMAL HIGH (ref 20–29)
Calcium: 9.2 mg/dL (ref 8.6–10.2)
Chloride: 88 mmol/L — ABNORMAL LOW (ref 96–106)
Creatinine, Ser: 1.35 mg/dL — ABNORMAL HIGH (ref 0.76–1.27)
Globulin, Total: 3.7 g/dL (ref 1.5–4.5)
Glucose: 389 mg/dL — ABNORMAL HIGH (ref 70–99)
Potassium: 2.7 mmol/L — CL (ref 3.5–5.2)
Sodium: 135 mmol/L (ref 134–144)
Total Protein: 7.5 g/dL (ref 6.0–8.5)
eGFR: 58 mL/min/{1.73_m2} — ABNORMAL LOW (ref >59)

## 2021-05-13 LAB — HEMOGLOBIN A1C
Est. average glucose Bld gHb Est-mCnc: 269 mg/dL
Hgb A1c MFr Bld: 11 % — ABNORMAL HIGH (ref 4.8–5.6)

## 2021-05-13 LAB — CBC
Hematocrit: 40 % (ref 37.5–51.0)
Hemoglobin: 14.4 g/dL (ref 13.0–17.7)
MCH: 34.4 pg — ABNORMAL HIGH (ref 26.6–33.0)
MCHC: 36 g/dL — ABNORMAL HIGH (ref 31.5–35.7)
MCV: 96 fL (ref 79–97)
Platelets: 235 10*3/uL (ref 150–450)
RBC: 4.19 x10E6/uL (ref 4.14–5.80)
RDW: 12.7 % (ref 11.6–15.4)
WBC: 6.8 10*3/uL (ref 3.4–10.8)

## 2021-05-13 LAB — LIPID PANEL
Chol/HDL Ratio: 2.8 ratio (ref 0.0–5.0)
Cholesterol, Total: 135 mg/dL (ref 100–199)
HDL: 49 mg/dL (ref >39)
LDL Chol Calc (NIH): 62 mg/dL (ref 0–99)
Triglycerides: 138 mg/dL (ref 0–149)
VLDL Cholesterol Cal: 24 mg/dL (ref 5–40)

## 2021-05-13 NOTE — Progress Notes (Signed)
? ? ?Chronic Care Management ?Pharmacy Assistant  ? ?Name: [name] [name]  MRN: [id number] DOB: May 07, 1955 ? ?Reason for Encounter: Chart Review For Initial Visit With Clinical Pharmacist ?  ?Conditions to be addressed/monitored: ?HTN, CAD, CHF, DM II, HLD ? ?Primary concerns for visit include: ?DM II, HTN  ? ?Recent office visits:  ?02/12/2021 OV (PCP) [name], MD; Start Medrol Dosepak.  Will place referral to sports medicine for further evaluation management ? ?12/04/2020 VV (PCP) [name], MD;  We will send in guaifenesin-codeine.  He will need to follow-up with pulmonology soon.  They are avoiding Hycodan cough syrup ? ?11/23/2020 VV (PCP) [name], MD; We will give small amount of promethazine-codeine cough syrup.  This has been prescribed to him in the past and he is done well with it.  He will need to follow back up with pulmonology if symptoms persist. ? ?Recent consult visits:  ?04/29/2021 OV (Cardiology) [name], MD; no medication changes indicated. ? ?03/25/2021 OV (Sports Medicine) Gregor Hams, MD; no medication changes indicated. ? ?01/16/2021 OV (Pulmonology) Baird Lyons D, MD; sample Trelegy 100    inhale 1 puff, then rinse mouth, once daily. See if this helps the cough. Please get your CPAP machine and your nebulizer machine out of storage and get back to using them. ? ?12/17/2020 OV (Pulmonology) Martyn Ehrich, NP; Advised he take mucinex '1200mg'$  twice daily x 1-2 weeks or until cough resolved. Start prednisone taper as directed. Recommend checking PFTs and sputum culture. Repeat imaging today. - Add zyrtec '10mg'$  daily and Saline ocean spray twice daily  ? ?11/06/2020 OV (Cardiology) [name] T, PA-C; no medication changes indicated. ? ?10/17/2020 OV (Pulmonology) Martyn Ehrich, NP; We will try to keep him of hycodan cough syrup. Recommend he try OTC Delsym, continue mucinex '600mg'$  BID for congestion and take tesslon perles '200mg'$  TID as needed for  cough. ? ?Hospital visits:  ?None in previous 6 months ? ?Medications: ?Outpatient Encounter Medications as of 05/13/2021  ?Medication Sig  ? aspirin EC 81 MG tablet Take 81 mg by mouth daily.  ? B-D ULTRAFINE III SHORT PEN 31G X 8 MM MISC   ? benzonatate (TESSALON) 100 MG capsule Take 2 capsules (200 mg total) by mouth 3 (three) times daily.  ? chlorhexidine (HIBICLENS) 4 % external liquid Apply topically daily as needed.  ? collagenase (SANTYL) ointment Apply 1 application topically daily. Right ankle measures 6.0 x 0.3cm.  ? diclofenac Sodium (VOLTAREN) 1 % GEL APPLY 2 GRAMS TOPICALLY 4 TIMES DAILY TO KNEE  ? eplerenone (INSPRA) 25 MG tablet TAKE 1 TABLET(25 MG) BY MOUTH DAILY  ? famotidine (PEPCID) 20 MG tablet TAKE 1 TABLET(20 MG) BY MOUTH DAILY  ? fluticasone (FLONASE) 50 MCG/ACT nasal spray Place 1 spray into both nostrils daily.  ? Fluticasone-Umeclidin-Vilant (TRELEGY ELLIPTA) 100-62.5-25 MCG/ACT AEPB Inhale 1 puff into the lungs daily.  ? glucose blood (ONETOUCH ULTRA) test strip Use as instructed  ? guaiFENesin-codeine 100-10 MG/5ML syrup Take 5 mLs by mouth 3 (three) times daily as needed for cough.  ? HUMULIN R U-500 KWIKPEN 500 UNIT/ML kwikpen Inject 50-60 Units into the skin See admin instructions. 60 units in the AM, 50 units at lunch and 50 in the evening  ? ipratropium-albuterol (DUONEB) 0.5-2.5 (3) MG/3ML SOLN 1 vial in nebulizer up to 4 times daily  ? Lancets (ACCU-CHEK MULTICLIX) lancets   ? LANTUS SOLOSTAR 100 UNIT/ML Solostar Pen Inject 55 Units into the skin every  morning.  ? losartan (COZAAR) 50 MG tablet Take 0.5 tablets (25 mg total) by mouth daily.  ? MAGNESIUM-OXIDE 400 (241.3 Mg) MG tablet Take 1 tablet by mouth 2 (two) times daily.  ? methylPREDNISolone (MEDROL DOSEPAK) 4 MG TBPK tablet 6-5-4-3-2-1-OFF (Patient not taking: Reported on 05/09/2021)  ? metoprolol tartrate (LOPRESSOR) 50 MG tablet TAKE 1 TABLET(50 MG) BY MOUTH TWICE DAILY  ? nitroGLYCERIN (NITROSTAT) 0.4 MG SL tablet Place 1  tablet (0.4 mg total) under the tongue every 5 (five) minutes as needed for chest pain.  ? nystatin ointment (MYCOSTATIN) Apply 1 application topically 2 (two) times daily.  ? polyethylene glycol (MIRALAX / GLYCOLAX) 17 g packet Take 17 g by mouth daily.  ? potassium chloride SA (KLOR-CON) 20 MEQ tablet TAKE 2 TABLETS(40 MEQ) BY MOUTH THREE TIMES DAILY  ? pregabalin (LYRICA) 50 MG capsule Take 1 capsule (50 mg total) by mouth at bedtime as needed (nerve pain).  ? PROAIR HFA 108 (90 Base) MCG/ACT inhaler INHALE 2 PUFFS INTO THE LUNGS EVERY 6 HOURS AS NEEDED FOR WHEEZING OR SHORTNESS OF BREATH  ? rosuvastatin (CRESTOR) 20 MG tablet Take 1 tablet (20 mg total) by mouth every evening.  ? silver sulfADIAZINE (SSD) 1 % cream APPLY PEA SIZED AMOUNT TOPICALLY TO WOUND DAILY  ? simvastatin (ZOCOR) 80 MG tablet Take 1/2 tablet BY MOUTH ( 40 mg total) one time daily  ? torsemide (DEMADEX) 20 MG tablet TAKE 1 TABLET(20 MG) BY MOUTH TWICE DAILY  ? ?No facility-administered encounter medications on file as of 05/13/2021.  ? ?Current Medications: ?Lyrica 50 mg last filled 03/25/2021 30 DS ?Onetouch Ultra Test Strip last filled 03/28/2021 90 DS ?Famotidine 20 mg last filled 02/14/2021 90 DS ?Chlorhexidine 4% ?Medrol Dosepak last filled 02/12/2021 6 DS ?Trelegy Ellipta last filled 03/18/2021 30 DS ?Silver Sulfadiazine 1% cream last filled 01/07/2021 20 DS ?ProAir HFA last filled 11/10/2020 25 DS ?Guaifenesin-codeine 100-10 mg/83m last filled 12/04/2020 8 DS - not taking ?Fluticasone 50 mcg/act nasal spray last filled 11/23/2020 60 DS ?Eplerenone 25 mg last filled 04/21/2021 90 DS ?Metoprolol Tartrate 50 mg last filled 02/15/2021 90 DS ?Ipratopium-albuterol 0.5-2.5 mg/mL ?Torsemide 20 mg last filled 02/07/2021 90 DS ?Losartan 50 mg last filled 01/26/2021 90 DS ?Nitroglycerin 0.4 mg last filled 11/09/2020 10 DS ?Simvastatin 80 mg not taking ?Benzonatate 100 mg ?Potassium chloride 20 meq last filled 08/26/2020 30 DS ?Diclofenac sodium  last filled 01/08/2021 7 DS ?Humulin U-500 last filled 01/13/2020 9 DS ?Nystatin ointment last filled 10/31/2019 15 DS ?Magnesium-oxide 500 mg last filled 10/06/2019 30 DS ?Lantus Solostar last filled 03/25/2021 96 DS ?Miralax 17g packet ?Collagenase ointment last filled 07/23/2019 90 DS ?Rosuvastatin 20 mg last filled 01/29/2021 90 DS ?B-D ultrafine III short pen last filled 02/08/2021 90 DS ?Accu-chek multiclix ?Aspirin 81 mg ?Humalog 100 u 04/03/2021 90 DS ? ?Patient Questions: ?Any changes in your medications or health? ?Patient denies any new changes in his medications or health. ? ?Any side effects from any medications?  ?Patient denies any side effects from any of his medications. ? ?Do you have any symptoms or problems not managed by your medications? ?Patient states he has poor circulation in his lower legs. He states he needs to buy some new compression stockings. ? ?Any concerns about your health right now? ?Patient denies any concerns with his health at this time. ? ?Has your provider asked that you check blood pressure, blood sugar, or follow special diet at home? ?Patient states he checks both blood pressure and blood  sugar. He does not follow any special diet. Patient states he is having trouble giving up soda. ? ?Do you get any type of exercise on a regular basis? ?Patient states he walks or 30 minutes a day. ? ?Can you think of a goal you would like to reach for your health? ?Patient states he would like to lose weight. ? ?Do you have any problems getting your medications? ?Patient denies having any problems getting any of his medications. ? ?Is there anything that you would like to discuss during the appointment?  ?Patient states he does not have anything he would like to discuss. ? ?Please bring medications and supplements to appointment ? ?Care Gaps: ?Medicare Annual Wellness: Completed 11/15/2020 ?Ophthalmology Exam: Overdue since 03/13/2021 ?Foot Exam: Next due on 11/02/2021 ?Hemoglobin A1C: 11.0%  on 05/09/2021 ?Colonoscopy: Overdue - never done ? ?Future Appointments  ?Date Time Provider [place]  ?05/14/2021  9:00 AM [facility] CCM PHARMACIST [facility]  ?05/20/2021 11:00 AM [facility]

## 2021-05-14 ENCOUNTER — Ambulatory Visit (INDEPENDENT_AMBULATORY_CARE_PROVIDER_SITE_OTHER): Payer: Medicare Other | Admitting: Pharmacist

## 2021-05-14 ENCOUNTER — Telehealth: Payer: Self-pay | Admitting: *Deleted

## 2021-05-14 DIAGNOSIS — E782 Mixed hyperlipidemia: Secondary | ICD-10-CM

## 2021-05-14 DIAGNOSIS — I5032 Chronic diastolic (congestive) heart failure: Secondary | ICD-10-CM

## 2021-05-14 DIAGNOSIS — E1169 Type 2 diabetes mellitus with other specified complication: Secondary | ICD-10-CM

## 2021-05-14 DIAGNOSIS — M6281 Muscle weakness (generalized): Secondary | ICD-10-CM | POA: Diagnosis not present

## 2021-05-14 DIAGNOSIS — M25611 Stiffness of right shoulder, not elsewhere classified: Secondary | ICD-10-CM | POA: Diagnosis not present

## 2021-05-14 DIAGNOSIS — E119 Type 2 diabetes mellitus without complications: Secondary | ICD-10-CM | POA: Diagnosis not present

## 2021-05-14 DIAGNOSIS — M25532 Pain in left wrist: Secondary | ICD-10-CM | POA: Diagnosis not present

## 2021-05-14 DIAGNOSIS — M25612 Stiffness of left shoulder, not elsewhere classified: Secondary | ICD-10-CM | POA: Diagnosis not present

## 2021-05-14 DIAGNOSIS — I25708 Atherosclerosis of coronary artery bypass graft(s), unspecified, with other forms of angina pectoris: Secondary | ICD-10-CM

## 2021-05-14 DIAGNOSIS — M25531 Pain in right wrist: Secondary | ICD-10-CM | POA: Diagnosis not present

## 2021-05-14 DIAGNOSIS — R202 Paresthesia of skin: Secondary | ICD-10-CM | POA: Diagnosis not present

## 2021-05-14 DIAGNOSIS — I10 Essential (primary) hypertension: Secondary | ICD-10-CM

## 2021-05-14 NOTE — Telephone Encounter (Signed)
Please schedule OV with Dr [name]  ?

## 2021-05-14 NOTE — Telephone Encounter (Signed)
-----   Message from [name], MD sent at 05/14/2021  1:14 PM EDT ----- ? ?----- Message ----- ?From: Betti Cruz, RMA ?Sent: 05/14/2021  11:04 AM EDT ?To: [name], MD ? ? ?----- Message ----- ?From: [name], MD ?Sent: 05/14/2021  10:41 AM EDT ?To: Evern Bio ? ? ?----- Message ----- ?From: Betti Cruz, RMA ?Sent: 05/14/2021  10:29 AM EDT ?To: [name], MD ? ? ?----- Message ----- ?From: [name], MD ?Sent: 05/14/2021  10:11 AM EDT ?To: Evern Bio ? ?Hi Team. See below note. He will need an office visit for this per insurance guidelines. ? ?Algis Greenhouse. [name], MD ?05/14/2021 10:11 AM  ? ?----- Message ----- ?From: Edythe Clarity, [facility] ?Sent: 05/14/2021  10:02 AM EDT ?To: [name], MD ? ?Patient requesting a sliding bench for the tub so that he can sit down and slide in.  Is this something we could order for him? ? ? ? ? ? ?

## 2021-05-14 NOTE — Patient Instructions (Addendum)
Visit Information ? ? Goals Addressed   ? ?  ?  ?  ?  ? This Visit's Progress  ?  Monitor and Manage My Blood Sugar-Diabetes Type 2     ?  Timeframe:  Long-Range Goal ?Priority:  High ?Start Date:   05/14/21                          ?Expected End Date: 11/13/21                     ? ?Follow Up Date 08/13/21  ?  ?- check blood sugar before and after exercise ?- check blood sugar if I feel it is too high or too low ?- take the blood sugar log to all doctor visits  ?  ?Why is this important?   ?Checking your blood sugar at home helps to keep it from getting very high or very low.  ?Writing the results in a diary or log helps the doctor know how to care for you.  ?Your blood sugar log should have the time, date and the results.  ?Also, write down the amount of insulin or other medicine that you take.  ?Other information, like what you ate, exercise done and how you were feeling, will also be helpful.   ?  ?Notes:  ? ?05/14/21 - working on decreasing regular sodas! ?  ? ?  ? ?Patient Care Plan: General Pharmacy (Adult)  ?  ? ?Problem Identified: HTN, HF, CAD, Type II DM w/ Neuropathy, HLD   ?Priority: High  ?Onset Date: 05/14/2021  ?  ? ?Long-Range Goal: Patient-Specific Goal   ?Start Date: 05/14/2021  ?Expected End Date: 11/13/2021  ?This Visit's Progress: On track  ?Priority: High  ?Note:   ?Current Barriers:  ?Unable to achieve control of glucose  ? ?Pharmacist Clinical Goal(s):  ?Patient will achieve control of glucose as evidenced by A1c through collaboration with PharmD and provider.  ? ?Interventions: ?1:1 collaboration with [name], MD regarding development and update of comprehensive plan of care as evidenced by provider attestation and co-signature ?Inter-disciplinary care team collaboration (see longitudinal plan of care) ?Comprehensive medication review performed; medication list updated in electronic medical record ? ?Hypertension (BP goal <130/80) ?-Controlled ?-Current treatment: ?Losartan '25mg'$   Appropriate, Effective, Safe, Accessible ?Metoprolol tartrate '50mg'$  BID Appropriate, Effective, Safe, Accessible ?-Medications previously tried: none noted  ?-Current home readings: "normal" ?-Current dietary habits: see DM ?-Current exercise habits: walking some at grocery store ?-Denies hypotensive/hypertensive symptoms ?-Educated on BP goals and benefits of medications for prevention of heart attack, stroke and kidney damage; ?Exercise goal of 150 minutes per week; ?Importance of home blood pressure monitoring; ?Symptoms of hypotension and importance of maintaining adequate hydration; ?-Counseled to monitor BP at home a few times per week, document, and provide log at future appointments ?-Recommended to continue current medication ? ?Hyperlipidemia/CAD: (LDL goal < 70) ?-Controlled ?-Current treatment: ?Rosuvastatin '20mg'$  daily Appropriate, Effective, Safe, Accessible ?-Medications previously tried: simvastatin  ?-Current dietary patterns: see DM ?-Educated on Cholesterol goals;  ?Benefits of statin for ASCVD risk reduction; ?Importance of limiting foods high in cholesterol; ?-Recommended to continue current medication ?Most recent LDL is well controlled - no adverse effects.  Stressed importance of adherence. ? 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%) ?-Uncontrolled ?-Current medications: ?Humulin U-500 60-50-50 tid with meals Appropriate, Query effective, ?Lantus 60 units once daily Appropriate, Query effective,  ?-Medications previously tried: Jardiance (yeast)  ?-Current home glucose readings ?fasting  glucose: 140-200 ?post prandial glucose: 200-300s ?-Denies hypoglycemic/hyperglycemic symptoms ?-Current meal patterns:  ?breakfast: eggs, grits, sausage links  ?lunch: salad, [place] sandwich  ?dinner: baked chicken, rice w/ broccoli ?drinks: sodas (4-6 12oz per day), water ?-Current exercise: minimal, does walk some around grocery store ?-Educated on A1c and blood sugar goals; ?Complications of diabetes including  kidney damage, retinal damage, and cardiovascular disease; ?Prevention and management of hypoglycemic episodes; ?Benefits of routine self-monitoring of blood sugar; ?Dietary education regarding carbs, sugars ?-Counseled to check feet daily and get yearly eye exams ?-Recommended to continue current medication ?I believe his intake of sodas alone is a large part of elevated glucose.  He is working on switching to the zero sugar sodas.  Also discussed carbs in other dietary sources.  He was concerned that his sugar would drop to low.  Explained that we could decrease insulin if sugar started dropping too low. Agreeable to work on sodas, will follow closely.  Recommend recheck A1c in 3 months. ? ?Heart Failure (Goal: manage symptoms and prevent exacerbations) ?-Controlled ?-Last ejection fraction: 50-55% ?-Current treatment: ?Losartan '25mg'$  Appropriate, Effective, Safe, Accessible ?Eplerenone '25mg'$  Appropriate, Effective, Safe, Accessible ?Metoprolol tartrate '50mg'$  BID Appropriate, Effective, Safe, Accessible ?Torsemide '20mg'$  Appropriate, Effective, Safe, Accessible ?-Medications previously tried: none noted  ?-Current home BP/HR readings: "normal" ?-Current dietary habits: working on processed foods and salt intake ?-Educated on Benefits of medications for managing symptoms and prolonging life ?Importance of weighing daily; if you gain more than 3 pounds in one day or 5 pounds in one week, contact providers ?-Recommended to continue current medication ?Monitor weights and SOB for fluid retention. ? ?Patient Goals/Self-Care Activities ?Patient will:  ?- take medications as prescribed as evidenced by patient report and record review ?check glucose daily, document, and provide at future appointments ?engage in dietary modifications by decreasing regular soda intake ? ?Follow Up Plan: The care management team will reach out to the patient again over the next 90 days.  ?  ? ? ?Mr. Gosney was given information about Chronic Care  Management services today including:  ?CCM service includes personalized support from designated clinical staff supervised by his physician, including individualized plan of care and coordination with other care providers ?24/7 contact phone numbers for assistance for urgent and routine care needs. ?Standard insurance, coinsurance, copays and deductibles apply for chronic care management only during months in which we provide at least 20 minutes of these services. Most insurances cover these services at 100%, however patients may be responsible for any copay, coinsurance and/or deductible if applicable. This service may help you avoid the need for more expensive face-to-face services. ?Only one practitioner may furnish and bill the service in a calendar month. ?The patient may stop CCM services at any time (effective at the end of the month) by phone call to the office staff. ? ?Patient agreed to services and verbal consent obtained.  ? ?The patient verbalized understanding of instructions, educational materials, and care plan provided today and agreed to receive a mailed copy of patient instructions, educational materials, and care plan.  ?Telephone follow up appointment with pharmacy team member scheduled for: 3 months ? ?Edythe Clarity, [facility]  ?Beverly Milch, PharmD ?Clinical Pharmacist  ?[place] July ?([phone] ? ?

## 2021-05-14 NOTE — Progress Notes (Signed)
? ?Chronic Care Management ?Pharmacy Note ? ?05/14/2021 ?Name:  [name] [name] MRN:  [id number] DOB:  May 20, 1955 ? ?Summary: ?Initial visit with PharmD.  Patient doing well besides elevated A1c.  Most recently 45.  Drinks 4-6 regular sodas per day. Large part of elevated A1c.  Spent most of the visit convincing him to decrease this - replace with zero sugar initially.  He is agreeable to try.  Concerned about hypoglycemia.  Discussed that we can decrease insulin if this happens.  Cannot tolerate SGLT-2 due to yeast.  He requests a shower bench that he can slide into the tub with. ? ?Recommendations/Changes made from today's visit: ?Cut back on sodas! ? ?Plan: ?FU 3 months ? ? ?Subjective: ?[name] [name] is an 67 y.o. year old male who is a primary patient of [name], Algis Greenhouse, MD.  The CCM team was consulted for assistance with disease management and care coordination needs.   ? ?Engaged with patient by telephone for initial visit in response to provider referral for pharmacy case management and/or care coordination services.  ? ?Consent to Services:  ?The patient was given the following information about Chronic Care Management services today, agreed to services, and gave verbal consent: 1. CCM service includes personalized support from designated clinical staff supervised by the primary care provider, including individualized plan of care and coordination with other care providers 2. 24/7 contact phone numbers for assistance for urgent and routine care needs. 3. Service will only be billed when office clinical staff spend 20 minutes or more in a month to coordinate care. 4. Only one practitioner may furnish and bill the service in a calendar month. 5.The patient may stop CCM services at any time (effective at the end of the month) by phone call to the office staff. 6. The patient will be responsible for cost sharing (co-pay) of up to 20% of the service fee (after annual deductible is met). Patient agreed to services  and consent obtained. ? ?Patient Care Team: ?[name], MD as PCP - General (Family Medicine) ?[name], MD as PCP - Cardiology (Cardiology) ?Delrae Rend, MD as Consulting Physician (Endocrinology) ?Gardiner Barefoot, DPM as Consulting Physician (Podiatry) ?Deneise Lever, MD as Consulting Physician (Pulmonary Disease) ?[name], [name], NP (Inactive) as Nurse Practitioner (Vascular Surgery) ?[facility] as Consulting Physician ?Edythe Clarity, [facility] as Pharmacist (Pharmacist) ? ?Recent office visits:  ?02/12/2021 OV (PCP) [name], MD; Start Medrol Dosepak.  Will place referral to sports medicine for further evaluation management ?  ?12/04/2020 [facility]V (PCP) [name], MD;  We will send in guaifenesin-codeine.  He will need to follow-up with pulmonology soon.  They are avoiding Hycodan cough syrup ?  ?11/23/2020 VV (PCP) [name], MD; We will give small amount of promethazine-codeine cough syrup.  This has been prescribed to him in the past and he is done well with it.  He will need to follow back up with pulmonology if symptoms persist. ?  ?Recent consult visits:  ?04/29/2021 [facility] (Cardiology) [name], MD; no medication changes indicated. ?  ?03/25/2021 [facility] (Sports Medicine) Gregor Hams, MD; no medication changes indicated. ?  ?01/16/2021 [facility] (Pulmonology) Baird Lyons D, MD; sample Trelegy 100    inhale 1 puff, then rinse mouth, once daily. See if this helps the cough. Please get your CPAP machine and your nebulizer machine out of storage and get back to using them. ?  ?12/17/2020 [facility] (Pulmonology) Martyn Ehrich, NP;  Advised he take mucinex 1245m twice daily x 1-2 weeks or until cough resolved. Start prednisone taper as directed. Recommend checking PFTs and sputum culture. Repeat imaging today. - Add zyrtec 169mdaily and Saline ocean spray twice daily  ?  ?11/06/2020 [facility] (Cardiology) WeRichardson Dopp, PA-C; no medication changes indicated. ?   ?10/17/2020 OV (Pulmonology) WaMartyn EhrichNP; We will try to keep him of hycodan cough syrup. Recommend he try OTC Delsym, continue mucinex 60062mID for congestion and take tesslon perles 200m47mD as needed for cough. ?  ?Hospital visits:  ?None in previous 6 months ? ? ?Objective: ? ?Lab Results  ?Component Value Date  ? CREATININE 1.35 (H) 05/09/2021  ? BUN 14 05/09/2021  ? GFR 78.08 10/07/2018  ? EGFR 58 (L) 05/09/2021  ? GFRNONAA >60 09/02/2020  ? GFRAA 65 12/12/2019  ? NA 135 05/09/2021  ? K 2.7 (LL) 05/09/2021  ? CALCIUM 9.2 05/09/2021  ? CO2 31 (H) 05/09/2021  ? GLUCOSE 389 (H) 05/09/2021  ? ? ?Lab Results  ?Component Value Date/Time  ? HGBA1C 11.0 (H) 05/09/2021 09:57 AM  ? HGBA1C 10.6 02/02/2020 12:00 AM  ? HGBA1C 10.8 10/12/2019 12:00 AM  ? HGBA1C 10.8 10/12/2019 12:00 AM  ? GFR 78.08 10/07/2018 02:47 PM  ? GFR 72.72 11/01/2012 05:02 PM  ? MICROALBUR 22.48 03/24/2018 12:00 AM  ?  ?Last diabetic Eye exam: No results found for: HMDIABEYEEXA  ?Last diabetic Foot exam: No results found for: HMDIABFOOTEX  ? ?Lab Results  ?Component Value Date  ? CHOL 135 05/09/2021  ? HDL 49 05/09/2021  ? LDL[place]03/30/2023  ? LDLDIRECT 71.0 10/07/2018  ? TRIG 138 05/09/2021  ? CHOLHDL 2.8 05/09/2021  ? ? ? ?  Latest Ref Rng & Units 05/09/2021  ?  9:57 AM 09/02/2019  ?  4:45 AM 06/03/2019  ? 12:45 PM  ?Hepatic Function  ?Total Protein 6.0 - 8.5 g/dL 7.5   7.7   8.3    ?Albumin 3.8 - 4.8 g/dL 3.8   3.3   3.8    ?AST 0 - 40 IU/L '15   17   14    ' ?ALT 0 - 44 IU/L '13   13   9    ' ?Alk Phosphatase 44 - 121 IU/L 96   63   90    ?Total Bilirubin 0.0 - 1.2 mg/dL 1.0   1.0   0.5    ? ? ?Lab Results  ?Component Value Date/Time  ? TSH 3.06 10/07/2018 02:47 PM  ? TSH 2.99 03/23/2018 12:00 AM  ? TSH 2.99 03/23/2018 12:00 AM  ? TSH 1.71 03/20/2017 03:14 PM  ? ? ? ?  Latest Ref Rng & Units 05/09/2021  ?  9:57 AM 09/02/2020  ?  3:09 AM 09/03/2019  ?  6:07 AM  ?CBC  ?WBC 3.4 - 10.8 x10E3/uL 6.8   5.0   7.6    ?Hemoglobin 13.0 - 17.7 g/dL  14.4   14.8   14.6    ?Hematocrit 37.5 - 51.0 % 40.0   43.9   44.9    ?Platelets 150 - 450 x10E3/uL 235   169   189    ? ? ?No results found for: VD25OH ? ?Clinical ASCVD: Yes  ?The 10-year ASCVD risk score (Arnett DK, et al., 2019) is: 18.2% ?  Values used to calculate the score: ?    Age: 40 y50rs ?    Sex: Male ?    Is Non-Hispanic African American:  Yes ?    Diabetic: Yes ?    Tobacco smoker: No ?    Systolic Blood Pressure: 338 mmHg ?    Is BP treated: Yes ?    HDL Cholesterol: 49 mg/dL ?    Total Cholesterol: 135 mg/dL   ? ? ?  11/15/2020  ?  9:42 AM 11/10/2019  ?  9:44 AM 04/04/2019  ?  2:19 PM  ?Depression screen PHQ 2/9  ?Decreased Interest 0 0 0  ?Down, Depressed, Hopeless 0 0 0  ?PHQ - 2 Score 0 0 0  ?  ? ?Social History  ? ?Tobacco Use  ?Smoking Status Former  ? Packs/day: 1.50  ? Years: 30.00  ? Pack years: 45.00  ? Types: Cigarettes  ? Quit date: 10/20/2004  ? Years since quitting: 16.5  ?Smokeless Tobacco Never  ? ?BP Readings from Last 3 Encounters:  ?05/09/21 100/62  ?03/25/21 102/68  ?02/12/21 118/70  ? ?Pulse Readings from Last 3 Encounters:  ?05/09/21 78  ?03/25/21 81  ?02/12/21 78  ? ?Wt Readings from Last 3 Encounters:  ?05/09/21 (!) 301 lb (136.5 kg)  ?03/25/21 (!) 302 lb 9.6 oz (137.3 kg)  ?02/12/21 (!) 305 lb 6.4 oz (138.5 kg)  ? ?BMI Readings from Last 3 Encounters:  ?05/09/21 41.98 kg/m?  ?03/25/21 42.20 kg/m?  ?02/12/21 42.59 kg/m?  ? ? ?Assessment/Interventions: Review of patient past medical history, allergies, medications, health status, including review of consultants reports, laboratory and other test data, was performed as part of comprehensive evaluation and provision of chronic care management services.  ? ?SDOH:  (Social Determinants of Health) assessments and interventions performed: Yes ? ?Financial Resource Strain: Low Risk   ? Difficulty of Paying Living Expenses: Not hard at all  ? ?Food Insecurity: No Food Insecurity  ? Worried About Charity fundraiser in the Last Year: Never  true  ? Ran Out of Food in the Last Year: Never true  ? ? ?SDOH Screenings  ? ?Alcohol Screen: Not on file  ?Depression (PHQ2-9): Low Risk   ? PHQ-2 Score: 0  ?Financial Resource Strain: Low Risk   ? Difficulty o

## 2021-05-15 ENCOUNTER — Other Ambulatory Visit: Payer: Self-pay | Admitting: Family Medicine

## 2021-05-15 DIAGNOSIS — Z794 Long term (current) use of insulin: Secondary | ICD-10-CM | POA: Diagnosis not present

## 2021-05-15 DIAGNOSIS — Z8719 Personal history of other diseases of the digestive system: Secondary | ICD-10-CM | POA: Diagnosis not present

## 2021-05-15 DIAGNOSIS — E1142 Type 2 diabetes mellitus with diabetic polyneuropathy: Secondary | ICD-10-CM | POA: Diagnosis not present

## 2021-05-15 DIAGNOSIS — E1165 Type 2 diabetes mellitus with hyperglycemia: Secondary | ICD-10-CM | POA: Diagnosis not present

## 2021-05-15 MED ORDER — ONETOUCH ULTRA VI STRP: ORAL_STRIP | 0 refills | Status: DC

## 2021-05-16 ENCOUNTER — Ambulatory Visit: Payer: Medicare Other | Admitting: Family Medicine

## 2021-05-17 NOTE — Telephone Encounter (Signed)
Spoke with pt regarding his K+ result and verified he has been taking his potassium TID.  Pt is aware he needs lab repeated in 2 weeks (4/17).  Pt has an appt with his PCP 4//11 and would like his K+ checked at that appt.  He will speak with Dr [name] regarding this at his upcoming appt.   ?

## 2021-05-20 ENCOUNTER — Ambulatory Visit (INDEPENDENT_AMBULATORY_CARE_PROVIDER_SITE_OTHER): Payer: Medicare Other | Admitting: Internal Medicine

## 2021-05-20 DIAGNOSIS — J40 Bronchitis, not specified as acute or chronic: Secondary | ICD-10-CM | POA: Diagnosis not present

## 2021-05-20 LAB — PULMONARY FUNCTION TEST
DL/VA % pred: 83 %
DL/VA: 3.42 ml/min/mmHg/L
DLCO cor % pred: 31 %
DLCO cor: 8.47 ml/min/mmHg
DLCO unc % pred: 31 %
DLCO unc: 8.47 ml/min/mmHg
FEF 25-75 Post: 0.73 L/sec
FEF 25-75 Pre: 0.99 L/sec
FEF2575-%Change-Post: -26 %
FEF2575-%Pred-Post: 26 %
FEF2575-%Pred-Pre: 36 %
FEV1-%Change-Post: -8 %
FEV1-%Pred-Post: 43 %
FEV1-%Pred-Pre: 47 %
FEV1-Post: 1.34 L
FEV1-Pre: 1.47 L
FEV1FVC-%Change-Post: 5 %
FEV1FVC-%Pred-Pre: 93 %
FEV6-%Change-Post: -13 %
FEV6-%Pred-Post: 45 %
FEV6-%Pred-Pre: 52 %
FEV6-Post: 1.77 L
FEV6-Pre: 2.04 L
FEV6FVC-%Pred-Post: 104 %
FEV6FVC-%Pred-Pre: 104 %
FVC-%Change-Post: -13 %
FVC-%Pred-Post: 43 %
FVC-%Pred-Pre: 50 %
FVC-Post: 1.77 L
FVC-Pre: 2.04 L
Post FEV1/FVC ratio: 76 %
Post FEV6/FVC ratio: 100 %
Pre FEV1/FVC ratio: 72 %
Pre FEV6/FVC Ratio: 100 %
RV % pred: 101 %
RV: 2.48 L
TLC % pred: 71 %
TLC: 5.17 L

## 2021-05-20 NOTE — Progress Notes (Signed)
Full PFT completed today ? ?

## 2021-05-21 ENCOUNTER — Ambulatory Visit: Payer: Medicare Other | Admitting: Family Medicine

## 2021-05-21 DIAGNOSIS — R202 Paresthesia of skin: Secondary | ICD-10-CM | POA: Diagnosis not present

## 2021-05-21 DIAGNOSIS — M25611 Stiffness of right shoulder, not elsewhere classified: Secondary | ICD-10-CM | POA: Diagnosis not present

## 2021-05-21 DIAGNOSIS — M6281 Muscle weakness (generalized): Secondary | ICD-10-CM | POA: Diagnosis not present

## 2021-05-21 DIAGNOSIS — M25612 Stiffness of left shoulder, not elsewhere classified: Secondary | ICD-10-CM | POA: Diagnosis not present

## 2021-05-21 DIAGNOSIS — E119 Type 2 diabetes mellitus without complications: Secondary | ICD-10-CM | POA: Diagnosis not present

## 2021-05-21 DIAGNOSIS — M25531 Pain in right wrist: Secondary | ICD-10-CM | POA: Diagnosis not present

## 2021-05-21 DIAGNOSIS — M25532 Pain in left wrist: Secondary | ICD-10-CM | POA: Diagnosis not present

## 2021-05-22 ENCOUNTER — Ambulatory Visit: Payer: Medicare Other | Admitting: Family Medicine

## 2021-05-24 DIAGNOSIS — L97312 Non-pressure chronic ulcer of right ankle with fat layer exposed: Secondary | ICD-10-CM | POA: Diagnosis not present

## 2021-05-24 DIAGNOSIS — L97321 Non-pressure chronic ulcer of left ankle limited to breakdown of skin: Secondary | ICD-10-CM | POA: Diagnosis not present

## 2021-05-27 ENCOUNTER — Ambulatory Visit (INDEPENDENT_AMBULATORY_CARE_PROVIDER_SITE_OTHER): Payer: Medicare Other | Admitting: Family Medicine

## 2021-05-27 ENCOUNTER — Other Ambulatory Visit: Payer: Self-pay

## 2021-05-27 ENCOUNTER — Encounter: Payer: Self-pay | Admitting: Family Medicine

## 2021-05-27 ENCOUNTER — Telehealth: Payer: Self-pay | Admitting: Family Medicine

## 2021-05-27 VITALS — BP 105/65 | HR 73 | Temp 98.1°F | Ht 71.0 in | Wt 304.2 lb

## 2021-05-27 DIAGNOSIS — I10 Essential (primary) hypertension: Secondary | ICD-10-CM

## 2021-05-27 DIAGNOSIS — G63 Polyneuropathy in diseases classified elsewhere: Secondary | ICD-10-CM

## 2021-05-27 DIAGNOSIS — M1990 Unspecified osteoarthritis, unspecified site: Secondary | ICD-10-CM

## 2021-05-27 DIAGNOSIS — E782 Mixed hyperlipidemia: Secondary | ICD-10-CM

## 2021-05-27 DIAGNOSIS — I5032 Chronic diastolic (congestive) heart failure: Secondary | ICD-10-CM

## 2021-05-27 LAB — BASIC METABOLIC PANEL
BUN: 14 mg/dL (ref 6–23)
CO2: 31 mEq/L (ref 19–32)
Calcium: 9.1 mg/dL (ref 8.4–10.5)
Chloride: 93 mEq/L — ABNORMAL LOW (ref 96–112)
Creatinine, Ser: 1.48 mg/dL (ref 0.40–1.50)
GFR: 48.7 mL/min — ABNORMAL LOW (ref >60.00)
Glucose, Bld: 283 mg/dL — ABNORMAL HIGH (ref 70–99)
Potassium: 2.7 mEq/L — CL (ref 3.5–5.1)
Sodium: 136 mEq/L (ref 135–145)

## 2021-05-27 MED ORDER — FLUTICASONE PROPIONATE 50 MCG/ACT NA SUSP: 1.0000 | Freq: Every day | NASAL | 12 refills | Status: DC

## 2021-05-27 MED ORDER — DICLOFENAC SODIUM 1 % EX GEL: CUTANEOUS | 2 refills | Status: DC

## 2021-05-27 MED ORDER — ROSUVASTATIN CALCIUM 20 MG PO TABS: 20.0000 mg | ORAL_TABLET | Freq: Every evening | ORAL | 11 refills | Status: DC

## 2021-05-27 NOTE — Patient Instructions (Signed)
It was very nice to see you today! ? ?We will order your rolling walker and tub bench. ? ?I will refill your medications. ? ?We will check blood work today. ? ?Take care, ?Dr [name] ? ?PLEASE NOTE: ? ?If you had any lab tests please let us know if you have not heard back within a few days. You may see your results on mychart before we have a chance to review them but we will give you a call once they are reviewed by [place]. If we ordered any referrals today, please let us know if you have not heard from their office within the next week.  ? ?Please try these tips to maintain a healthy lifestyle: ? ?Eat at least 3 REAL meals and 1-2 snacks per day.  Aim for no more than 5 hours between eating.  If you eat breakfast, please do so within one hour of getting up.  ? ?Each meal should contain half fruits/vegetables, one quarter protein, and one quarter carbs (no bigger than a computer mouse) ? ?Cut down on sweet beverages. This includes juice, soda, and sweet tea.  ? ?Drink at least 1 glass of water with each meal and aim for at least 8 glasses per day ? ?Exercise at least 150 minutes every week.   ?

## 2021-05-27 NOTE — Assessment & Plan Note (Signed)
He is currently on Lyrica 50 mg nightly as needed. ?

## 2021-05-27 NOTE — Telephone Encounter (Signed)
Called pt and advised critical labs for low potassium level. Advised pt he should be taking potassium 2 tablets tid. Pt verbalized understanding and placed future orders for BMP to be rechecked this Friday morning and advised he could go to [employer] office to have drawn.  ?

## 2021-05-27 NOTE — Telephone Encounter (Signed)
Cri ?

## 2021-05-27 NOTE — Assessment & Plan Note (Signed)
At goal on current regimen per cardiology.  He is currently on eplerenone 25 mg daily, losartan 25 mg daily, Toprol tartrate 50 mg twice daily, and torsemide 20 mg daily. ?

## 2021-05-27 NOTE — Assessment & Plan Note (Signed)
Continue management per sports medicine.  He has a significant degree of underlying debility related to his chronic pain secondary to osteoarthritis and degenerative changes as well as his CHF.  We will give DME order for tub bench and walking rollator today. ?

## 2021-05-27 NOTE — Telephone Encounter (Signed)
Critical Lab ? ?Timed received: 3:45  ?Low Potasium (2.7) ?

## 2021-05-27 NOTE — Progress Notes (Signed)
? ?  [name] [name] is a 66 y.o. male who presents today for an office visit. ? ?Assessment/Plan:  ?Chronic Problems Addressed Today: ?HTN (hypertension) ?At goal on current regimen per cardiology.  He is currently on eplerenone 25 mg daily, losartan 25 mg daily, Toprol tartrate 50 mg twice daily, and torsemide 20 mg daily. ? ?Polyneuropathy associated with underlying disease ([place]) ?He is currently on Lyrica 50 mg nightly as needed. ? ?Osteoarthritis ?Continue management per sports medicine.  He has a significant degree of underlying debility related to his chronic pain secondary to osteoarthritis and degenerative changes as well as his CHF.  We will give DME order for tub bench and walking rollator today. ? ?  ?Subjective:  ?HPI: ? ?See A/p for status of chronic conditions.   ? ?   ?  ?Objective:  ?Physical Exam: ?BP 105/65 (BP Location: Right Arm)   Pulse 73   Temp 98.1 ?F (36.7 ?C) (Temporal)   Ht '5\' 11"'$  (1.803 m)   Wt (!) 304 lb 3.2 oz (138 kg)   SpO2 94%   BMI 42.43 kg/m?   ?Gen: No acute distress, resting comfortably ?CV: Regular rate and rhythm with no murmurs appreciated ?Pulm: Normal work of breathing, clear to auscultation bilaterally with no crackles, wheezes, or rhonchi ?Neuro: Grossly normal, moves all extremities ?Psych: Normal affect and thought content ? ?   ? ?Algis Greenhouse. [name], MD ?05/27/2021 10:21 AM  ?

## 2021-05-28 ENCOUNTER — Other Ambulatory Visit: Payer: Self-pay | Admitting: *Deleted

## 2021-05-28 DIAGNOSIS — M25532 Pain in left wrist: Secondary | ICD-10-CM | POA: Diagnosis not present

## 2021-05-28 DIAGNOSIS — E876 Hypokalemia: Secondary | ICD-10-CM

## 2021-05-28 DIAGNOSIS — M25611 Stiffness of right shoulder, not elsewhere classified: Secondary | ICD-10-CM | POA: Diagnosis not present

## 2021-05-28 DIAGNOSIS — E119 Type 2 diabetes mellitus without complications: Secondary | ICD-10-CM | POA: Diagnosis not present

## 2021-05-28 DIAGNOSIS — M6281 Muscle weakness (generalized): Secondary | ICD-10-CM | POA: Diagnosis not present

## 2021-05-28 DIAGNOSIS — M25612 Stiffness of left shoulder, not elsewhere classified: Secondary | ICD-10-CM | POA: Diagnosis not present

## 2021-05-28 DIAGNOSIS — R202 Paresthesia of skin: Secondary | ICD-10-CM | POA: Diagnosis not present

## 2021-05-28 DIAGNOSIS — M25531 Pain in right wrist: Secondary | ICD-10-CM | POA: Diagnosis not present

## 2021-05-28 NOTE — Progress Notes (Signed)
Please inform patient of the following: ? ?Potassium level is low but stable. Please reiterate that he needs to be taking his potassium supplement as prescribed and we can recheck in 1-2 weeks.

## 2021-05-30 DIAGNOSIS — L603 Nail dystrophy: Secondary | ICD-10-CM | POA: Diagnosis not present

## 2021-05-30 DIAGNOSIS — D492 Neoplasm of unspecified behavior of bone, soft tissue, and skin: Secondary | ICD-10-CM | POA: Diagnosis not present

## 2021-05-30 DIAGNOSIS — L97321 Non-pressure chronic ulcer of left ankle limited to breakdown of skin: Secondary | ICD-10-CM | POA: Diagnosis not present

## 2021-05-30 DIAGNOSIS — E1151 Type 2 diabetes mellitus with diabetic peripheral angiopathy without gangrene: Secondary | ICD-10-CM | POA: Diagnosis not present

## 2021-05-30 DIAGNOSIS — I739 Peripheral vascular disease, unspecified: Secondary | ICD-10-CM | POA: Diagnosis not present

## 2021-05-30 DIAGNOSIS — L97312 Non-pressure chronic ulcer of right ankle with fat layer exposed: Secondary | ICD-10-CM | POA: Diagnosis not present

## 2021-05-30 NOTE — Telephone Encounter (Signed)
[name] Barrack, MD  ?05/28/2021  1:23 PM EDT   ?  ?Please inform patient of the following: ?  ?Potassium level is low but stable. Please reiterate that he needs to be taking his potassium supplement as prescribed and we can recheck in 1-2 weeks.   ? ?The above information is from lab results dated 05/27/21. ?Pt's K+ was 2.7.  Dr [name] is following.   ?

## 2021-06-03 ENCOUNTER — Telehealth: Payer: Self-pay

## 2021-06-03 NOTE — Telephone Encounter (Signed)
Patient calling back requesting call in regard to order for shower bench and walker.  States this was to have been ordered several weeks ago but has not heard anything in regard.

## 2021-06-04 DIAGNOSIS — R202 Paresthesia of skin: Secondary | ICD-10-CM | POA: Diagnosis not present

## 2021-06-04 DIAGNOSIS — M25531 Pain in right wrist: Secondary | ICD-10-CM | POA: Diagnosis not present

## 2021-06-04 DIAGNOSIS — M25612 Stiffness of left shoulder, not elsewhere classified: Secondary | ICD-10-CM | POA: Diagnosis not present

## 2021-06-04 DIAGNOSIS — M6281 Muscle weakness (generalized): Secondary | ICD-10-CM | POA: Diagnosis not present

## 2021-06-04 DIAGNOSIS — M25532 Pain in left wrist: Secondary | ICD-10-CM | POA: Diagnosis not present

## 2021-06-04 DIAGNOSIS — E119 Type 2 diabetes mellitus without complications: Secondary | ICD-10-CM | POA: Diagnosis not present

## 2021-06-04 DIAGNOSIS — M25611 Stiffness of right shoulder, not elsewhere classified: Secondary | ICD-10-CM | POA: Diagnosis not present

## 2021-06-05 DIAGNOSIS — J40 Bronchitis, not specified as acute or chronic: Secondary | ICD-10-CM | POA: Diagnosis not present

## 2021-06-05 DIAGNOSIS — J45998 Other asthma: Secondary | ICD-10-CM | POA: Diagnosis not present

## 2021-06-05 NOTE — Telephone Encounter (Signed)
Advised patient these were printed for him in the office at his visit and he could take those into a DME store of his choosing. Pt advised he still has them and would take them into a DME supply store. Pt verbalized understanding ?

## 2021-06-06 DIAGNOSIS — M6281 Muscle weakness (generalized): Secondary | ICD-10-CM | POA: Diagnosis not present

## 2021-06-06 DIAGNOSIS — R296 Repeated falls: Secondary | ICD-10-CM | POA: Diagnosis not present

## 2021-06-06 DIAGNOSIS — E119 Type 2 diabetes mellitus without complications: Secondary | ICD-10-CM | POA: Diagnosis not present

## 2021-06-06 DIAGNOSIS — M25611 Stiffness of right shoulder, not elsewhere classified: Secondary | ICD-10-CM | POA: Diagnosis not present

## 2021-06-06 DIAGNOSIS — M25612 Stiffness of left shoulder, not elsewhere classified: Secondary | ICD-10-CM | POA: Diagnosis not present

## 2021-06-06 DIAGNOSIS — M25532 Pain in left wrist: Secondary | ICD-10-CM | POA: Diagnosis not present

## 2021-06-06 DIAGNOSIS — R269 Unspecified abnormalities of gait and mobility: Secondary | ICD-10-CM | POA: Diagnosis not present

## 2021-06-06 DIAGNOSIS — M545 Low back pain, unspecified: Secondary | ICD-10-CM | POA: Diagnosis not present

## 2021-06-06 DIAGNOSIS — R202 Paresthesia of skin: Secondary | ICD-10-CM | POA: Diagnosis not present

## 2021-06-06 DIAGNOSIS — M25531 Pain in right wrist: Secondary | ICD-10-CM | POA: Diagnosis not present

## 2021-06-09 DIAGNOSIS — I25708 Atherosclerosis of coronary artery bypass graft(s), unspecified, with other forms of angina pectoris: Secondary | ICD-10-CM | POA: Diagnosis not present

## 2021-06-09 DIAGNOSIS — E1169 Type 2 diabetes mellitus with other specified complication: Secondary | ICD-10-CM | POA: Diagnosis not present

## 2021-06-09 DIAGNOSIS — I110 Hypertensive heart disease with heart failure: Secondary | ICD-10-CM

## 2021-06-09 DIAGNOSIS — Z794 Long term (current) use of insulin: Secondary | ICD-10-CM | POA: Diagnosis not present

## 2021-06-09 DIAGNOSIS — E782 Mixed hyperlipidemia: Secondary | ICD-10-CM

## 2021-06-10 ENCOUNTER — Telehealth: Payer: Self-pay | Admitting: Family Medicine

## 2021-06-10 DIAGNOSIS — M25531 Pain in right wrist: Secondary | ICD-10-CM | POA: Diagnosis not present

## 2021-06-10 DIAGNOSIS — E119 Type 2 diabetes mellitus without complications: Secondary | ICD-10-CM | POA: Diagnosis not present

## 2021-06-10 DIAGNOSIS — M6281 Muscle weakness (generalized): Secondary | ICD-10-CM | POA: Diagnosis not present

## 2021-06-10 DIAGNOSIS — M25612 Stiffness of left shoulder, not elsewhere classified: Secondary | ICD-10-CM | POA: Diagnosis not present

## 2021-06-10 DIAGNOSIS — R202 Paresthesia of skin: Secondary | ICD-10-CM | POA: Diagnosis not present

## 2021-06-10 DIAGNOSIS — R269 Unspecified abnormalities of gait and mobility: Secondary | ICD-10-CM | POA: Diagnosis not present

## 2021-06-10 DIAGNOSIS — M25611 Stiffness of right shoulder, not elsewhere classified: Secondary | ICD-10-CM | POA: Diagnosis not present

## 2021-06-10 DIAGNOSIS — M545 Low back pain, unspecified: Secondary | ICD-10-CM | POA: Diagnosis not present

## 2021-06-10 DIAGNOSIS — M25532 Pain in left wrist: Secondary | ICD-10-CM | POA: Diagnosis not present

## 2021-06-10 DIAGNOSIS — R296 Repeated falls: Secondary | ICD-10-CM | POA: Diagnosis not present

## 2021-06-10 NOTE — Telephone Encounter (Signed)
Pt states he cannot find the needed items at the stores in [place]. He found a location that will provide the items (listed below) with a "prescription" from the doctor. ?Bath chair/bench transfer and walker ? ?Please send to: ?[facility] ? ?[place] ?Fax [phone] ? ?Please call patient with any questions. ?

## 2021-06-11 NOTE — Telephone Encounter (Signed)
Patients orders were faxed to company patient requested and received confirmation fax was sent successfully. Called pt to advise and unable to lvm due to mailbox being full. ?

## 2021-06-12 DIAGNOSIS — M545 Low back pain, unspecified: Secondary | ICD-10-CM | POA: Diagnosis not present

## 2021-06-12 DIAGNOSIS — M25611 Stiffness of right shoulder, not elsewhere classified: Secondary | ICD-10-CM | POA: Diagnosis not present

## 2021-06-12 DIAGNOSIS — R269 Unspecified abnormalities of gait and mobility: Secondary | ICD-10-CM | POA: Diagnosis not present

## 2021-06-12 DIAGNOSIS — M6281 Muscle weakness (generalized): Secondary | ICD-10-CM | POA: Diagnosis not present

## 2021-06-12 DIAGNOSIS — M25532 Pain in left wrist: Secondary | ICD-10-CM | POA: Diagnosis not present

## 2021-06-12 DIAGNOSIS — E119 Type 2 diabetes mellitus without complications: Secondary | ICD-10-CM | POA: Diagnosis not present

## 2021-06-12 DIAGNOSIS — M25612 Stiffness of left shoulder, not elsewhere classified: Secondary | ICD-10-CM | POA: Diagnosis not present

## 2021-06-12 DIAGNOSIS — M25531 Pain in right wrist: Secondary | ICD-10-CM | POA: Diagnosis not present

## 2021-06-12 DIAGNOSIS — R202 Paresthesia of skin: Secondary | ICD-10-CM | POA: Diagnosis not present

## 2021-06-12 DIAGNOSIS — R296 Repeated falls: Secondary | ICD-10-CM | POA: Diagnosis not present

## 2021-06-13 DIAGNOSIS — L97312 Non-pressure chronic ulcer of right ankle with fat layer exposed: Secondary | ICD-10-CM | POA: Diagnosis not present

## 2021-06-18 DIAGNOSIS — E119 Type 2 diabetes mellitus without complications: Secondary | ICD-10-CM | POA: Diagnosis not present

## 2021-06-18 DIAGNOSIS — H25813 Combined forms of age-related cataract, bilateral: Secondary | ICD-10-CM | POA: Diagnosis not present

## 2021-06-19 DIAGNOSIS — M545 Low back pain, unspecified: Secondary | ICD-10-CM | POA: Diagnosis not present

## 2021-06-19 DIAGNOSIS — R269 Unspecified abnormalities of gait and mobility: Secondary | ICD-10-CM | POA: Diagnosis not present

## 2021-06-19 DIAGNOSIS — M25532 Pain in left wrist: Secondary | ICD-10-CM | POA: Diagnosis not present

## 2021-06-19 DIAGNOSIS — M25612 Stiffness of left shoulder, not elsewhere classified: Secondary | ICD-10-CM | POA: Diagnosis not present

## 2021-06-19 DIAGNOSIS — M25531 Pain in right wrist: Secondary | ICD-10-CM | POA: Diagnosis not present

## 2021-06-19 DIAGNOSIS — R202 Paresthesia of skin: Secondary | ICD-10-CM | POA: Diagnosis not present

## 2021-06-19 DIAGNOSIS — M6281 Muscle weakness (generalized): Secondary | ICD-10-CM | POA: Diagnosis not present

## 2021-06-19 DIAGNOSIS — R296 Repeated falls: Secondary | ICD-10-CM | POA: Diagnosis not present

## 2021-06-19 DIAGNOSIS — M25611 Stiffness of right shoulder, not elsewhere classified: Secondary | ICD-10-CM | POA: Diagnosis not present

## 2021-06-19 DIAGNOSIS — E119 Type 2 diabetes mellitus without complications: Secondary | ICD-10-CM | POA: Diagnosis not present

## 2021-06-26 DIAGNOSIS — M25531 Pain in right wrist: Secondary | ICD-10-CM | POA: Diagnosis not present

## 2021-06-26 DIAGNOSIS — R269 Unspecified abnormalities of gait and mobility: Secondary | ICD-10-CM | POA: Diagnosis not present

## 2021-06-26 DIAGNOSIS — M25532 Pain in left wrist: Secondary | ICD-10-CM | POA: Diagnosis not present

## 2021-06-26 DIAGNOSIS — M545 Low back pain, unspecified: Secondary | ICD-10-CM | POA: Diagnosis not present

## 2021-06-26 DIAGNOSIS — E119 Type 2 diabetes mellitus without complications: Secondary | ICD-10-CM | POA: Diagnosis not present

## 2021-06-26 DIAGNOSIS — M25611 Stiffness of right shoulder, not elsewhere classified: Secondary | ICD-10-CM | POA: Diagnosis not present

## 2021-06-26 DIAGNOSIS — M25612 Stiffness of left shoulder, not elsewhere classified: Secondary | ICD-10-CM | POA: Diagnosis not present

## 2021-06-26 DIAGNOSIS — R296 Repeated falls: Secondary | ICD-10-CM | POA: Diagnosis not present

## 2021-06-26 DIAGNOSIS — R202 Paresthesia of skin: Secondary | ICD-10-CM | POA: Diagnosis not present

## 2021-06-26 DIAGNOSIS — M6281 Muscle weakness (generalized): Secondary | ICD-10-CM | POA: Diagnosis not present

## 2021-06-28 NOTE — Progress Notes (Signed)
PFTs showed severe obstructive airway disease, I have not seen him since Nov. Needs visit in person to discuss and see how he is doing respiratory wise

## 2021-07-01 DIAGNOSIS — M25531 Pain in right wrist: Secondary | ICD-10-CM | POA: Diagnosis not present

## 2021-07-01 DIAGNOSIS — M25532 Pain in left wrist: Secondary | ICD-10-CM | POA: Diagnosis not present

## 2021-07-01 DIAGNOSIS — R202 Paresthesia of skin: Secondary | ICD-10-CM | POA: Diagnosis not present

## 2021-07-01 DIAGNOSIS — M545 Low back pain, unspecified: Secondary | ICD-10-CM | POA: Diagnosis not present

## 2021-07-01 DIAGNOSIS — R269 Unspecified abnormalities of gait and mobility: Secondary | ICD-10-CM | POA: Diagnosis not present

## 2021-07-01 DIAGNOSIS — R296 Repeated falls: Secondary | ICD-10-CM | POA: Diagnosis not present

## 2021-07-01 DIAGNOSIS — M25611 Stiffness of right shoulder, not elsewhere classified: Secondary | ICD-10-CM | POA: Diagnosis not present

## 2021-07-01 DIAGNOSIS — M6281 Muscle weakness (generalized): Secondary | ICD-10-CM | POA: Diagnosis not present

## 2021-07-01 DIAGNOSIS — E119 Type 2 diabetes mellitus without complications: Secondary | ICD-10-CM | POA: Diagnosis not present

## 2021-07-01 DIAGNOSIS — M25612 Stiffness of left shoulder, not elsewhere classified: Secondary | ICD-10-CM | POA: Diagnosis not present

## 2021-07-05 DIAGNOSIS — R269 Unspecified abnormalities of gait and mobility: Secondary | ICD-10-CM | POA: Diagnosis not present

## 2021-07-05 DIAGNOSIS — M6281 Muscle weakness (generalized): Secondary | ICD-10-CM | POA: Diagnosis not present

## 2021-07-05 DIAGNOSIS — R202 Paresthesia of skin: Secondary | ICD-10-CM | POA: Diagnosis not present

## 2021-07-05 DIAGNOSIS — M25612 Stiffness of left shoulder, not elsewhere classified: Secondary | ICD-10-CM | POA: Diagnosis not present

## 2021-07-05 DIAGNOSIS — M545 Low back pain, unspecified: Secondary | ICD-10-CM | POA: Diagnosis not present

## 2021-07-05 DIAGNOSIS — M25531 Pain in right wrist: Secondary | ICD-10-CM | POA: Diagnosis not present

## 2021-07-05 DIAGNOSIS — R296 Repeated falls: Secondary | ICD-10-CM | POA: Diagnosis not present

## 2021-07-05 DIAGNOSIS — M25532 Pain in left wrist: Secondary | ICD-10-CM | POA: Diagnosis not present

## 2021-07-05 DIAGNOSIS — E119 Type 2 diabetes mellitus without complications: Secondary | ICD-10-CM | POA: Diagnosis not present

## 2021-07-05 DIAGNOSIS — M25611 Stiffness of right shoulder, not elsewhere classified: Secondary | ICD-10-CM | POA: Diagnosis not present

## 2021-07-10 DIAGNOSIS — I2510 Atherosclerotic heart disease of native coronary artery without angina pectoris: Secondary | ICD-10-CM | POA: Diagnosis not present

## 2021-07-10 DIAGNOSIS — S92535B Nondisplaced fracture of distal phalanx of left lesser toe(s), initial encounter for open fracture: Secondary | ICD-10-CM | POA: Diagnosis not present

## 2021-07-10 DIAGNOSIS — S91105A Unspecified open wound of left lesser toe(s) without damage to nail, initial encounter: Secondary | ICD-10-CM | POA: Diagnosis not present

## 2021-07-10 DIAGNOSIS — J45998 Other asthma: Secondary | ICD-10-CM | POA: Diagnosis not present

## 2021-07-10 DIAGNOSIS — J40 Bronchitis, not specified as acute or chronic: Secondary | ICD-10-CM | POA: Diagnosis not present

## 2021-07-10 DIAGNOSIS — S91302A Unspecified open wound, left foot, initial encounter: Secondary | ICD-10-CM | POA: Diagnosis not present

## 2021-07-12 DIAGNOSIS — M25611 Stiffness of right shoulder, not elsewhere classified: Secondary | ICD-10-CM | POA: Diagnosis not present

## 2021-07-12 DIAGNOSIS — R202 Paresthesia of skin: Secondary | ICD-10-CM | POA: Diagnosis not present

## 2021-07-12 DIAGNOSIS — M6281 Muscle weakness (generalized): Secondary | ICD-10-CM | POA: Diagnosis not present

## 2021-07-12 DIAGNOSIS — M25531 Pain in right wrist: Secondary | ICD-10-CM | POA: Diagnosis not present

## 2021-07-12 DIAGNOSIS — R269 Unspecified abnormalities of gait and mobility: Secondary | ICD-10-CM | POA: Diagnosis not present

## 2021-07-12 DIAGNOSIS — M25532 Pain in left wrist: Secondary | ICD-10-CM | POA: Diagnosis not present

## 2021-07-12 DIAGNOSIS — M25612 Stiffness of left shoulder, not elsewhere classified: Secondary | ICD-10-CM | POA: Diagnosis not present

## 2021-07-12 DIAGNOSIS — R296 Repeated falls: Secondary | ICD-10-CM | POA: Diagnosis not present

## 2021-07-12 DIAGNOSIS — E119 Type 2 diabetes mellitus without complications: Secondary | ICD-10-CM | POA: Diagnosis not present

## 2021-07-12 DIAGNOSIS — M545 Low back pain, unspecified: Secondary | ICD-10-CM | POA: Diagnosis not present

## 2021-07-16 DIAGNOSIS — R269 Unspecified abnormalities of gait and mobility: Secondary | ICD-10-CM | POA: Diagnosis not present

## 2021-07-16 DIAGNOSIS — R296 Repeated falls: Secondary | ICD-10-CM | POA: Diagnosis not present

## 2021-07-16 DIAGNOSIS — M6281 Muscle weakness (generalized): Secondary | ICD-10-CM | POA: Diagnosis not present

## 2021-07-16 DIAGNOSIS — M545 Low back pain, unspecified: Secondary | ICD-10-CM | POA: Diagnosis not present

## 2021-07-23 ENCOUNTER — Telehealth: Payer: Self-pay | Admitting: Cardiology

## 2021-07-23 DIAGNOSIS — R296 Repeated falls: Secondary | ICD-10-CM | POA: Diagnosis not present

## 2021-07-23 DIAGNOSIS — M545 Low back pain, unspecified: Secondary | ICD-10-CM | POA: Diagnosis not present

## 2021-07-23 DIAGNOSIS — M6281 Muscle weakness (generalized): Secondary | ICD-10-CM | POA: Diagnosis not present

## 2021-07-23 DIAGNOSIS — R269 Unspecified abnormalities of gait and mobility: Secondary | ICD-10-CM | POA: Diagnosis not present

## 2021-07-23 NOTE — Telephone Encounter (Signed)
Attempted to call the pt back and he did not answer and voicemail was unavailable.   Looks like his potassium was checked by Dimas Chyle MD with Family Medicine (lab result copied below) on 4/17, where further med instructions was advised and repeat lab with their office was ordered at that time.  Looks like pt never followed through with repeat BMET with their office in 1-2 weeks, from that initial 4/17 result.   [name], MD  05/28/2021  1:23 PM EDT     Please inform patient of the following:   Potassium level is low but stable. Please reiterate that he needs to be taking his potassium supplement as prescribed and we can recheck in 1-2 weeks.

## 2021-07-23 NOTE — Telephone Encounter (Signed)
Patient called stating last time he was here his potassium level was low.  He would like to get his potassium level check again, as well and his liver and kidney functions.

## 2021-07-24 NOTE — Telephone Encounter (Signed)
Spoke with pt and advised pt was to follow up with his PCP re: low potassium and order had been placed by that office for pt to repeat labs in April but pt did not follow up as requested.  Pt advised to contact his PCP office re: lab follow up.  Pt is to schedule follow up appointment with Dr Marlou Porch for September 2023. Pt verbalizes understanding and agrees with current plan.

## 2021-07-25 DIAGNOSIS — M545 Low back pain, unspecified: Secondary | ICD-10-CM | POA: Diagnosis not present

## 2021-07-25 DIAGNOSIS — R296 Repeated falls: Secondary | ICD-10-CM | POA: Diagnosis not present

## 2021-07-25 DIAGNOSIS — M6281 Muscle weakness (generalized): Secondary | ICD-10-CM | POA: Diagnosis not present

## 2021-07-25 DIAGNOSIS — R269 Unspecified abnormalities of gait and mobility: Secondary | ICD-10-CM | POA: Diagnosis not present

## 2021-07-30 ENCOUNTER — Other Ambulatory Visit (INDEPENDENT_AMBULATORY_CARE_PROVIDER_SITE_OTHER): Payer: Medicare Other

## 2021-07-30 DIAGNOSIS — E782 Mixed hyperlipidemia: Secondary | ICD-10-CM | POA: Diagnosis not present

## 2021-07-30 LAB — BASIC METABOLIC PANEL
BUN: 17 mg/dL (ref 6–23)
CO2: 27 mEq/L (ref 19–32)
Calcium: 9.3 mg/dL (ref 8.4–10.5)
Chloride: 96 mEq/L (ref 96–112)
Creatinine, Ser: 1.47 mg/dL (ref 0.40–1.50)
GFR: 49.04 mL/min — ABNORMAL LOW (ref >60.00)
Glucose, Bld: 281 mg/dL — ABNORMAL HIGH (ref 70–99)
Potassium: 3.4 mEq/L — ABNORMAL LOW (ref 3.5–5.1)
Sodium: 133 mEq/L — ABNORMAL LOW (ref 135–145)

## 2021-07-31 NOTE — Progress Notes (Signed)
Please inform patient of the following:  Potassium is much better than last time. Do not need to make any changes to his treatment plan at this time. HE should continue to take potassium supplements and we can recheck at his next office visit.

## 2021-08-02 DIAGNOSIS — J40 Bronchitis, not specified as acute or chronic: Secondary | ICD-10-CM | POA: Diagnosis not present

## 2021-08-02 DIAGNOSIS — J45998 Other asthma: Secondary | ICD-10-CM | POA: Diagnosis not present

## 2021-08-05 ENCOUNTER — Telehealth: Payer: Self-pay | Admitting: Internal Medicine

## 2021-08-05 MED ORDER — AZITHROMYCIN 250 MG PO TABS: ORAL_TABLET | ORAL | 0 refills | Status: DC

## 2021-08-05 NOTE — Telephone Encounter (Signed)
Called and spoke with patient. He verbalized understanding and will keep appt scheduled for 06/29. RX has been sent in for him.   Nothing further needed at time of call.

## 2021-08-05 NOTE — Telephone Encounter (Signed)
Send Zpak 250 mg, # 6, 2 today then one daily    and suggest otc Delsym cough syrup Emphasize that he needs to keep appointment 6/29

## 2021-08-07 DIAGNOSIS — R296 Repeated falls: Secondary | ICD-10-CM | POA: Diagnosis not present

## 2021-08-07 DIAGNOSIS — R269 Unspecified abnormalities of gait and mobility: Secondary | ICD-10-CM | POA: Diagnosis not present

## 2021-08-07 DIAGNOSIS — M6281 Muscle weakness (generalized): Secondary | ICD-10-CM | POA: Diagnosis not present

## 2021-08-07 DIAGNOSIS — M545 Low back pain, unspecified: Secondary | ICD-10-CM | POA: Diagnosis not present

## 2021-08-08 ENCOUNTER — Telehealth: Payer: Self-pay | Admitting: Nurse Practitioner

## 2021-08-08 ENCOUNTER — Encounter: Payer: Self-pay | Admitting: Nurse Practitioner

## 2021-08-08 ENCOUNTER — Ambulatory Visit: Payer: Medicare Other

## 2021-08-08 ENCOUNTER — Ambulatory Visit (INDEPENDENT_AMBULATORY_CARE_PROVIDER_SITE_OTHER): Payer: Medicare Other

## 2021-08-08 ENCOUNTER — Ambulatory Visit (INDEPENDENT_AMBULATORY_CARE_PROVIDER_SITE_OTHER): Payer: Medicare Other | Admitting: Nurse Practitioner

## 2021-08-08 VITALS — BP 106/60 | HR 78 | Temp 97.9°F | Ht 71.0 in | Wt 308.2 lb

## 2021-08-08 DIAGNOSIS — J984 Other disorders of lung: Secondary | ICD-10-CM | POA: Insufficient documentation

## 2021-08-08 DIAGNOSIS — I5032 Chronic diastolic (congestive) heart failure: Secondary | ICD-10-CM | POA: Diagnosis not present

## 2021-08-08 DIAGNOSIS — G4733 Obstructive sleep apnea (adult) (pediatric): Secondary | ICD-10-CM | POA: Diagnosis not present

## 2021-08-08 DIAGNOSIS — J410 Simple chronic bronchitis: Secondary | ICD-10-CM

## 2021-08-08 DIAGNOSIS — J449 Chronic obstructive pulmonary disease, unspecified: Secondary | ICD-10-CM | POA: Diagnosis not present

## 2021-08-08 DIAGNOSIS — J40 Bronchitis, not specified as acute or chronic: Secondary | ICD-10-CM | POA: Diagnosis not present

## 2021-08-08 DIAGNOSIS — R059 Cough, unspecified: Secondary | ICD-10-CM | POA: Diagnosis not present

## 2021-08-08 MED ORDER — PREDNISONE 20 MG PO TABS: 40.0000 mg | ORAL_TABLET | Freq: Every day | ORAL | 0 refills | Status: AC

## 2021-08-08 NOTE — Assessment & Plan Note (Addendum)
Concern for fluid overload r/t CHF. Last echo was in 2019. Check BMET and BNP. May need adjustment to torsemide. He is also at risk for PAH given his severe untreated OSA. Will follow up with cardiology depending on labs.

## 2021-08-08 NOTE — Patient Instructions (Addendum)
-  Restart Trelegy 1 puff daily. Brush tongue and rinse mouth afterwards. This is your maintenance inhaler and you should be using it daily. -Continue Albuterol inhaler 2 puffs or duoneb 3 mL neb every 6 hours as needed for shortness of breath or wheezing. Notify if symptoms persist despite rescue inhaler/neb use. This is your rescue inhaler -Continue pepcid 20 mg daily -Continue flonase 2 sprays each nostril daily  Restart your CPAP nightly, minimum of 4-6 hours. We discussed how untreated sleep apnea puts an individual at risk for cardiac arrhthymias, pulm HTN, DM, stroke and increases their risk for daytime accidents.   Complete z pack as previously prescribed Prednisone 40 mg daily for 5 days. Take in AM with food  We may need to adjust your fluid pills; I will call you to discuss after your chest x ray.   Chest x ray today   Labs today - BNP, BMET   Follow up in one week, prior to your surgery, with Dr. Annamaria Boots or Alanson Aly. If symptoms do not improve or worsen, please contact office for sooner follow up or seek emergency care.

## 2021-08-08 NOTE — Assessment & Plan Note (Signed)
He is not using his CPAP. We discussed the severity of his OSA and the risks of untreated sleep apnea. I urged him to restart therapy. He would not be a candidate for Inspire at this point given his BMI; however, we reviewed healthy weight management and he is going to try to cut back on sodas. He will let us know if they want referral to medical weight management.

## 2021-08-08 NOTE — Assessment & Plan Note (Addendum)
Flare in chronic bronchitis vs fluid overload given constellation of symptoms. He did have some improvement with z pack so given this and bronchospasm, we will treat him with prednisone burst. Check CXR to evaluate for pulmonary edema or superimposed infection. Restart Trelegy - discussed importance of compliance with maintenance inhalers. He may benefit from further workup with HRCT to assess for underlying ILD given his persistent cough and restrictive lung defect; although, this is likely related to his weight. We will discuss at his follow up.   Patient Instructions  -Restart Trelegy 1 puff daily. Brush tongue and rinse mouth afterwards. This is your maintenance inhaler and you should be using it daily. -Continue Albuterol inhaler 2 puffs or duoneb 3 mL neb every 6 hours as needed for shortness of breath or wheezing. Notify if symptoms persist despite rescue inhaler/neb use. This is your rescue inhaler -Continue pepcid 20 mg daily -Continue flonase 2 sprays each nostril daily  Restart your CPAP nightly, minimum of 4-6 hours. We discussed how untreated sleep apnea puts an individual at risk for cardiac arrhthymias, pulm HTN, DM, stroke and increases their risk for daytime accidents.   Complete z pack as previously prescribed Prednisone 40 mg daily for 5 days. Take in AM with food  We may need to adjust your fluid pills; I will call you to discuss after your chest x ray.   Chest x ray today   Labs today - BNP, BMET   Follow up in one week, prior to your surgery, with Dr. Annamaria Boots or Alanson Aly. If symptoms do not improve or worsen, please contact office for sooner follow up or seek emergency care.

## 2021-08-08 NOTE — Progress Notes (Signed)
$'@Patient'm$  ID: [name] [name], male    DOB: 1955/07/11, 66 y.o.   MRN: [id number]  Chief Complaint  Patient presents with   Follow-up    Patient is here to get his cough checked out. Patient also wants to talk about antibiotics.     Referring provider: [name], MD  HPI: 66 year old male, former smoker followed for chronic bronchitis and OSA on CPAP.  He is a patient Dr. Janee Morn and last seen in office 01/16/2021.  Past medical history significant for PVD, hypertension, CHF, CAD status post CABG, allergies, DM 2, cirrhosis, OA, morbid obesity, HLD.  TEST/EVENTS:  08/05/2016 HST: AHI 37.5/h, SPO2 low 67% 09/08/2018 HST: AHI 32.4, SPO2 low 73%.  Severe obstructive sleep apnea. 12/17/2020 CXR 2 view: Cardiomegaly with prior CABG.  There is mild central pulmonary vascular prominence and persistent mild central peribronchial thickening.  No acute process 05/20/2021 PFTs: FVC 50, FEV1 47, ratio 76, TLC 71, DLCOcor 31  01/16/2021: OV with Dr. Annamaria Boots.  Treated in November for bronchitis and rhinitis with Flonase, Zyrtec, saline spray, prednisone taper, Mucinex.  He has had a persistent cough since July after having COVID.  PFTs were ordered.  He has not been using nebulizer or CPAP machine; they are still in storage since he moved.  Symptoms consistent with chronic bronchitis and likely aggravated by COVID infection.  Recommended he could start on pressure Trelegy if this problem persisted -sample provided.  Emphasized importance of compliance with CPAP therapy.  08/08/2021: Today-acute Patient presents today after contacting the office earlier this week for increased cough with clear sputum production.  He was prescribed Z-Pak which she has 1 day left of.  He reports feeling somewhat better.  Cough is still present but not as prevalent as it was before.  He does note that his cough is worse at night and he also has some shortness of breath at night.  He has had some swelling in his lower extremities  which is slightly increased from baseline.  He feels like his breathing is not any worse from his baseline but he has been using his albuterol a couple times a day.  He has not been using the Trelegy although he does have a prescription for this.  He was a little unclear on the difference between the 2.  He denies any sick exposures, fevers, hemoptysis, wheezing, chest pain.  He has not been using his CPAP.  He was curious about inspire device but did not know if his weight would limit him from this.  He is anticipated to have cataract surgery next week on his right eye.  He was told by the surgeon that he needed to be seen prior to this.  Allergies  Allergen Reactions   Empagliflozin Rash    Patient reports yeast.    Ibuprofen Other (See Comments)    Stomach pain    Immunization History  Administered Date(s) Administered   Influenza Split 01/29/2009, 11/19/2009, 11/11/2010, 10/23/2011, 11/10/2012, 01/31/2013, 01/24/2014, 10/20/2014   Influenza, High Dose Seasonal PF 11/02/2020   Influenza,inj,Quad PF,6+ Mos 11/10/2013, 11/30/2015, 12/10/2016   Influenza-Unspecified 11/30/2015, 11/01/2020   PFIZER(Purple Top)SARS-COV-2 Vaccination 05/17/2019, 06/06/2019   Pneumococcal Polysaccharide-23 06/26/2008   Tdap 09/01/2019    Past Medical History:  Diagnosis Date   Acute myocardial infarction, unspecified site, episode of care unspecified 2005   Acute pancreatitis    CAD (coronary artery disease)    a. CABG in 2005 w LIMA to LAD, left radial to second circumflex marginal, saphenous  vein graft to PDA, saphenous vein graft to lateral subbrach of ramus intermediate, and sequential saphenous vein graft to the medial subbranch of ramus intermediate.   Chronic diastolic CHF (congestive heart failure) (HCC)    Cirrhosis of liver without mention of alcohol    CKD (chronic kidney disease), stage II    Complications affecting other specified body systems, hypertension    Ectopic atrial rhythm     Essential hypertension    Hyperlipidemia    Hypokalemia    a. intermitttent noncompliance with potassium supplement.   Hypomagnesemia    Morbid obesity (HCC)    Neuropathy    Other and unspecified hyperlipidemia    Proteinuria    RBBB    Sleep apnea    Type II or unspecified type diabetes mellitus without mention of complication, not stated as uncontrolled    Varicose veins of both lower extremities     Tobacco History: Social History   Tobacco Use  Smoking Status Former   Packs/day: 1.50   Years: 30.00   Total pack years: 45.00   Types: Cigarettes   Quit date: 10/20/2004   Years since quitting: 16.8  Smokeless Tobacco Never   Counseling given: Not Answered   Outpatient Medications Prior to Visit  Medication Sig Dispense Refill   aspirin EC 81 MG tablet Take 81 mg by mouth daily.     azithromycin (ZITHROMAX) 250 MG tablet Take 2 tablets on first day, then 1 tablet daily until finished. 6 tablet 0   B-D ULTRAFINE III SHORT PEN 31G X 8 MM MISC   1   chlorhexidine (HIBICLENS) 4 % external liquid Apply topically daily as needed. 120 mL 0   collagenase (SANTYL) ointment Apply 1 application topically daily. Right ankle measures 6.0 x 0.3cm. 30 g 5   diclofenac Sodium (VOLTAREN) 1 % GEL APPLY 2 GRAMS TOPICALLY 4 TIMES DAILY TO KNEE 100 g 2   eplerenone (INSPRA) 25 MG tablet TAKE 1 TABLET(25 MG) BY MOUTH DAILY 90 tablet 2   famotidine (PEPCID) 20 MG tablet TAKE 1 TABLET(20 MG) BY MOUTH DAILY 90 tablet 2   fluticasone (FLONASE) 50 MCG/ACT nasal spray Place 1 spray into both nostrils daily. 16 g 12   Fluticasone-Umeclidin-Vilant (TRELEGY ELLIPTA) 100-62.5-25 MCG/ACT AEPB Inhale 1 puff into the lungs daily. 60 each 5   glucose blood (ONETOUCH ULTRA) test strip Use as instructed 800 strip 0   guaiFENesin-codeine 100-10 MG/5ML syrup Take 5 mLs by mouth 3 (three) times daily as needed for cough. 120 mL 0   HUMALOG 100 UNIT/ML injection Inject into the skin.     HUMALOG KWIKPEN 200  UNIT/ML KwikPen Inject into the skin.     HUMULIN R U-500 KWIKPEN 500 UNIT/ML kwikpen Inject 50-60 Units into the skin See admin instructions. 60 units in the AM, 50 units at lunch and 50 in the evening 3 mL 0   ipratropium-albuterol (DUONEB) 0.5-2.5 (3) MG/3ML SOLN 1 vial in nebulizer up to 4 times daily 90 mL 12   Lancets (ACCU-CHEK MULTICLIX) lancets      LANTUS SOLOSTAR 100 UNIT/ML Solostar Pen Inject 55 Units into the skin every morning.     losartan (COZAAR) 50 MG tablet Take 0.5 tablets (25 mg total) by mouth daily. 45 tablet 3   MAGNESIUM-OXIDE 400 (241.3 Mg) MG tablet Take 1 tablet by mouth 2 (two) times daily.     metoprolol tartrate (LOPRESSOR) 50 MG tablet TAKE 1 TABLET(50 MG) BY MOUTH TWICE DAILY 180 tablet 2   nitroGLYCERIN (  NITROSTAT) 0.4 MG SL tablet Place 1 tablet (0.4 mg total) under the tongue every 5 (five) minutes as needed for chest pain. 25 tablet 4   nystatin ointment (MYCOSTATIN) Apply 1 application topically 2 (two) times daily. 30 g 0   ofloxacin (OCUFLOX) 0.3 % ophthalmic solution Place 1 drop into the right eye 4 (four) times daily.     polyethylene glycol (MIRALAX / GLYCOLAX) 17 g packet Take 17 g by mouth daily. 14 each 0   potassium chloride SA (KLOR-CON) 20 MEQ tablet TAKE 2 TABLETS(40 MEQ) BY MOUTH THREE TIMES DAILY 540 tablet 0   prednisoLONE acetate (PRED FORTE) 1 % ophthalmic suspension Place 1 drop into the right eye 4 (four) times daily.     pregabalin (LYRICA) 50 MG capsule Take 1 capsule (50 mg total) by mouth at bedtime as needed (nerve pain). 30 capsule 0   PROAIR HFA 108 (90 Base) MCG/ACT inhaler INHALE 2 PUFFS INTO THE LUNGS EVERY 6 HOURS AS NEEDED FOR WHEEZING OR SHORTNESS OF BREATH 8.5 g 5   rosuvastatin (CRESTOR) 20 MG tablet Take 1 tablet (20 mg total) by mouth every evening. 30 tablet 11   silver sulfADIAZINE (SSD) 1 % cream APPLY PEA SIZED AMOUNT TOPICALLY TO WOUND DAILY 50 g 0   torsemide (DEMADEX) 20 MG tablet TAKE 1 TABLET(20 MG) BY MOUTH TWICE  DAILY 180 tablet 3   No facility-administered medications prior to visit.     Review of Systems:   Constitutional: No weight loss or gain, night sweats, fevers, chills, fatigue, or lassitude. HEENT: No headaches, difficulty swallowing, tooth/dental problems, or sore throat. No sneezing, itching, ear ache, nasal congestion, or post nasal drip CV:  +orthopnea, swelling in lower extremities. No chest pain, PND, anasarca, dizziness, palpitations, syncope Resp: +shortness of breath with exertion (baseline); minimally productive cough. No excess mucus or change in color of mucus. No hemoptysis. No wheezing.  No chest wall deformity GI:  No heartburn, indigestion, abdominal pain, nausea, vomiting, diarrhea, change in bowel habits, loss of appetite, bloody stools.  GU: No dysuria, change in color of urine, urgency or frequency.  No flank pain, no hematuria  Skin: No rash, lesions, ulcerations MSK:  No joint pain or swelling.  No decreased range of motion.  No back pain. Neuro: No dizziness or lightheadedness.  Psych: No depression or anxiety. Mood stable.     Physical Exam:  BP 106/60 (BP Location: Right Arm, Patient Position: Sitting, Cuff Size: Large)   Pulse 78   Temp 97.9 F (36.6 C) (Oral)   Ht '5\' 11"'$  (1.803 m)   Wt (!) 308 lb 3.2 oz (139.8 kg)   SpO2 94%   BMI 42.99 kg/m   GEN: Pleasant, interactive, well-appearing; morbidly obese; in no acute distress. HEENT:  Normocephalic and atraumatic. PERRLA. Sclera white. Nasal turbinates pink, moist and patent bilaterally. No rhinorrhea present. Oropharynx pink and moist, without exudate or edema. No lesions, ulcerations, or postnasal drip.  NECK:  Supple w/ fair ROM. No JVD present. Normal carotid impulses w/o bruits. Thyroid symmetrical with no goiter or nodules palpated. No lymphadenopathy.   CV: RRR, no m/r/g, +1 pitting BLE edema. Pulses intact, +2 bilaterally. No cyanosis, pallor or clubbing. PULMONARY:  Unlabored, regular breathing.  Minimal scattered wheeze bilaterally A&P. No accessory muscle use. No dullness to percussion. GI: BS present and normoactive. Soft, non-tender to palpation. No organomegaly or masses detected. No CVA tenderness. MSK: No erythema, warmth or tenderness. Cap refil <2 sec all extrem. No deformities or  joint swelling noted.  Neuro: A/Ox3. No focal deficits noted.   Skin: Warm, no lesions or rashe Psych: Normal affect and behavior. Judgement and thought content appropriate.     Lab Results:  CBC    Component Value Date/Time   WBC 6.8 05/09/2021 0957   WBC 5.0 09/02/2020 0309   RBC 4.19 05/09/2021 0957   RBC 4.46 09/02/2020 0309   HGB 14.4 05/09/2021 0957   HCT 40.0 05/09/2021 0957   PLT 235 05/09/2021 0957   MCV 96 05/09/2021 0957   MCH 34.4 (H) 05/09/2021 0957   MCH 33.2 09/02/2020 0309   MCHC 36.0 (H) 05/09/2021 0957   MCHC 33.7 09/02/2020 0309   RDW 12.7 05/09/2021 0957   LYMPHSABS 3.6 09/01/2019 2150   MONOABS 0.9 09/01/2019 2150   EOSABS 0.2 09/01/2019 2150   BASOSABS 0.1 09/01/2019 2150    BMET    Component Value Date/Time   NA 133 (L) 07/30/2021 1138   NA 135 05/09/2021 0957   K 3.4 (L) 07/30/2021 1138   CL 96 07/30/2021 1138   CO2 27 07/30/2021 1138   GLUCOSE 281 (H) 07/30/2021 1138   BUN 17 07/30/2021 1138   BUN 14 05/09/2021 0957   CREATININE 1.47 07/30/2021 1138   CREATININE 1.01 03/20/2017 1514   CALCIUM 9.3 07/30/2021 1138   GFRNONAA >60 09/02/2020 0309   GFRAA 65 12/12/2019 1212    BNP    Component Value Date/Time   BNP 30.3 03/24/2016 1519     Imaging:  DG Chest 2 View  Result Date: 08/08/2021 CLINICAL DATA:  Cough. EXAM: CHEST - 2 VIEW COMPARISON:  Chest x-ray dated December 17, 2020. FINDINGS: Unchanged mild cardiomegaly status post CABG. Chronic interstitial coarsening is similar. Normal pulmonary vascularity. No focal consolidation, pleural effusion, or pneumothorax. No acute osseous abnormality. IMPRESSION: 1. No acute cardiopulmonary  disease. Electronically Signed   By: Titus Dubin M.D.   On: 08/08/2021 11:24         Latest Ref Rng & Units 05/20/2021   11:14 AM  PFT Results  FVC-Pre L 2.04   FVC-Predicted Pre % 50   FVC-Post L 1.77   FVC-Predicted Post % 43   Pre FEV1/FVC % % 72   Post FEV1/FCV % % 76   FEV1-Pre L 1.47   FEV1-Predicted Pre % 47   FEV1-Post L 1.34   DLCO uncorrected ml/min/mmHg 8.47   DLCO UNC% % 31   DLCO corrected ml/min/mmHg 8.47   DLCO COR %Predicted % 31   DLVA Predicted % 83   TLC L 5.17   TLC % Predicted % 71   RV % Predicted % 101     No results found for: "NITRICOXIDE"      Assessment & Plan:   Simple chronic bronchitis (HCC) Flare in chronic bronchitis vs fluid overload given constellation of symptoms. He did have some improvement with z pack so given this and bronchospasm, we will treat him with prednisone burst. Check CXR to evaluate for pulmonary edema or superimposed infection. Restart Trelegy - discussed importance of compliance with maintenance inhalers. He may benefit from further workup with HRCT to assess for underlying ILD given his persistent cough and restrictive lung defect; although, this is likely related to his weight. We will discuss at his follow up.   Patient Instructions  -Restart Trelegy 1 puff daily. Brush tongue and rinse mouth afterwards. This is your maintenance inhaler and you should be using it daily. -Continue Albuterol inhaler 2 puffs or duoneb 3 mL  neb every 6 hours as needed for shortness of breath or wheezing. Notify if symptoms persist despite rescue inhaler/neb use. This is your rescue inhaler -Continue pepcid 20 mg daily -Continue flonase 2 sprays each nostril daily  Restart your CPAP nightly, minimum of 4-6 hours. We discussed how untreated sleep apnea puts an individual at risk for cardiac arrhthymias, pulm HTN, DM, stroke and increases their risk for daytime accidents.   Complete z pack as previously prescribed Prednisone 40 mg daily  for 5 days. Take in AM with food  We may need to adjust your fluid pills; I will call you to discuss after your chest x ray.   Chest x ray today   Labs today - BNP, BMET   Follow up in one week, prior to your surgery, with Dr. Annamaria Boots or Alanson Aly. If symptoms do not improve or worsen, please contact office for sooner follow up or seek emergency care.    Chronic heart failure with preserved ejection fraction (HCC) Concern for fluid overload r/t CHF. Last echo was in 2019. Check BMET and BNP. May need adjustment to torsemide. He is also at risk for PAH given his severe untreated OSA. Will follow up with cardiology depending on labs.   Obstructive sleep apnea He is not using his CPAP. We discussed the severity of his OSA and the risks of untreated sleep apnea. I urged him to restart therapy. He would not be a candidate for Inspire at this point given his BMI; however, we reviewed healthy weight management and he is going to try to cut back on sodas. He will let us know if they want referral to medical weight management.    I spent 35 minutes of dedicated to the care of this patient on the date of this encounter to include pre-visit review of records, face-to-face time with the patient discussing conditions above, post visit ordering of testing, clinical documentation with the electronic health record, making appropriate referrals as documented, and communicating necessary findings to members of the patients care team.  Clayton Bibles, NP 08/08/2021  Pt aware and understands NP's role.

## 2021-08-08 NOTE — Telephone Encounter (Signed)
Called and spoke with patient. He is ok with an appt on 08/14/21 at 9am.   Nothing further needed at time of call.

## 2021-08-08 NOTE — Telephone Encounter (Signed)
You can double book me at [facility] on 7/5. Thanks.

## 2021-08-12 ENCOUNTER — Telehealth: Payer: Self-pay | Admitting: *Deleted

## 2021-08-12 ENCOUNTER — Ambulatory Visit: Payer: Medicare Other | Admitting: Pharmacist

## 2021-08-12 DIAGNOSIS — Z794 Long term (current) use of insulin: Secondary | ICD-10-CM

## 2021-08-12 DIAGNOSIS — E782 Mixed hyperlipidemia: Secondary | ICD-10-CM

## 2021-08-12 NOTE — Progress Notes (Unsigned)
Chronic Care Management Pharmacy Note  08/14/2021 Name:  [name] [name] MRN:  [id number] DOB:  10-15-1955  Summary: PharmD FU.  He is working to bring his sugar down.  A1c still most recently 11. Followed by endocrine.  Plans to implement exercise.  Recommendations/Changes made from today's visit: Cut back on sodas! - continue to work on this! He is due for Diabetic Eye exam!  Plan: FU 3 months   Subjective: [name] [name] is an 66 y.o. year old male who is a primary patient of [name], MD.  The CCM team was consulted for assistance with disease management and care coordination needs.    Engaged with patient by telephone for follow up visit in response to provider referral for pharmacy case management and/or care coordination services.   Consent to Services:  The patient was given the following information about Chronic Care Management services today, agreed to services, and gave verbal consent: 1. CCM service includes personalized support from designated clinical staff supervised by the primary care provider, including individualized plan of care and coordination with other care providers 2. 24/7 contact phone numbers for assistance for urgent and routine care needs. 3. Service will only be billed when office clinical staff spend 20 minutes or more in a month to coordinate care. 4. Only one practitioner may furnish and bill the service in a calendar month. 5.The patient may stop CCM services at any time (effective at the end of the month) by phone call to the office staff. 6. The patient will be responsible for cost sharing (co-pay) of up to 20% of the service fee (after annual deductible is met). Patient agreed to services and consent obtained.  Patient Care Team: [name], MD as PCP - General (Family Medicine) [name], MD as PCP - Cardiology (Cardiology) Delrae Rend, MD as Consulting Physician (Endocrinology) Gardiner Barefoot, DPM as Consulting Physician  (Podiatry) Deneise Lever, MD as Consulting Physician (Pulmonary Disease) [name], [name], NP (Inactive) as Nurse Practitioner (Vascular Surgery) [facility] as Consulting Physician Edythe Clarity, [facility] as Pharmacist (Pharmacist)  Recent office visits:  02/12/2021 OV (PCP) [name], MD; Start Medrol Dosepak.  Will place referral to sports medicine for further evaluation management   12/04/2020 VV (PCP) [name], MD;  We will send in guaifenesin-codeine.  He will need to follow-up with pulmonology soon.  They are avoiding Hycodan cough syrup   11/23/2020 VV (PCP) [name], MD; We will give small amount of promethazine-codeine cough syrup.  This has been prescribed to him in the past and he is done well with it.  He will need to follow back up with pulmonology if symptoms persist.   Recent consult visits:  04/29/2021 OV (Cardiology) [name], MD; no medication changes indicated.   03/25/2021 OV (Sports Medicine) Gregor Hams, MD; no medication changes indicated.   01/16/2021 OV (Pulmonology) Baird Lyons D, MD; sample Trelegy 100    inhale 1 puff, then rinse mouth, once daily. See if this helps the cough. Please get your CPAP machine and your nebulizer machine out of storage and get back to using them.   12/17/2020 OV (Pulmonology) Martyn Ehrich, NP; Advised he take mucinex 1218m twice daily x 1-2 weeks or until cough resolved. Start prednisone taper as directed. Recommend checking PFTs and sputum culture. Repeat imaging today. - Add zyrtec 1108mdaily and Saline ocean spray twice daily    11/06/2020 OV (Cardiology) WeKathlen Mody  Scott T, PA-C; no medication changes indicated.   10/17/2020 OV (Pulmonology) Martyn Ehrich, NP; We will try to keep him of hycodan cough syrup. Recommend he try OTC Delsym, continue mucinex 648m BID for congestion and take tesslon perles 2095mTID as needed for cough.   Hospital visits:  None in previous  6 months   Objective:  Lab Results  Component Value Date   CREATININE 1.47 07/30/2021   BUN 17 07/30/2021   GFR 49.04 (L) 07/30/2021   EGFR 58 (L) 05/09/2021   GFRNONAA >60 09/02/2020   GFRAA 65 12/12/2019   NA 133 (L) 07/30/2021   K 3.4 (L) 07/30/2021   CALCIUM 9.3 07/30/2021   CO2 27 07/30/2021   GLUCOSE 281 (H) 07/30/2021    Lab Results  Component Value Date/Time   HGBA1C 11.0 (H) 05/09/2021 09:57 AM   HGBA1C 10.6 02/02/2020 12:00 AM   HGBA1C 10.8 10/12/2019 12:00 AM   HGBA1C 10.8 10/12/2019 12:00 AM   GFR 49.04 (L) 07/30/2021 11:38 AM   GFR 48.70 (L) 05/27/2021 10:25 AM   MICROALBUR 22.48 03/24/2018 12:00 AM    Last diabetic Eye exam: No results found for: "HMDIABEYEEXA"  Last diabetic Foot exam: No results found for: "HMDIABFOOTEX"   Lab Results  Component Value Date   CHOL 135 05/09/2021   HDL 49 05/09/2021   LDLCALC 62 05/09/2021   LDLDIRECT 71.0 10/07/2018   TRIG 138 05/09/2021   CHOLHDL 2.8 05/09/2021       Latest Ref Rng & Units 05/09/2021    9:57 AM 09/02/2019    4:45 AM 06/03/2019   12:45 PM  Hepatic Function  Total Protein 6.0 - 8.5 g/dL 7.5  7.7  8.3   Albumin 3.8 - 4.8 g/dL 3.8  3.3  3.8   AST 0 - 40 IU/L '15  17  14   ' ALT 0 - 44 IU/L '13  13  9   ' Alk Phosphatase 44 - 121 IU/L 96  63  90   Total Bilirubin 0.0 - 1.2 mg/dL 1.0  1.0  0.5     Lab Results  Component Value Date/Time   TSH 3.06 10/07/2018 02:47 PM   TSH 2.99 03/23/2018 12:00 AM   TSH 2.99 03/23/2018 12:00 AM   TSH 1.71 03/20/2017 03:14 PM       Latest Ref Rng & Units 05/09/2021    9:57 AM 09/02/2020    3:09 AM 09/03/2019    6:07 AM  CBC  WBC 3.4 - 10.8 x10E3/uL 6.8  5.0  7.6   Hemoglobin 13.0 - 17.7 g/dL 14.4  14.8  14.6   Hematocrit 37.5 - 51.0 % 40.0  43.9  44.9   Platelets 150 - 450 x10E3/uL 235  169  189     No results found for: "VD25OH"  Clinical ASCVD: Yes  The 10-year ASCVD risk score (Arnett DK, et al., 2019) is: 20.1%   Values used to calculate the score:      Age: [age]     Sex: Male     Is Non-Hispanic African American: Yes     Diabetic: Yes     Tobacco smoker: No     Systolic Blood Pressure: 10037mHg     Is BP treated: Yes     HDL Cholesterol: 49 mg/dL     Total Cholesterol: 135 mg/dL       11/15/2020    9:42 AM 11/10/2019    9:44 AM 04/04/2019    2:19 PM  Depression screen  PHQ 2/9  Decreased Interest 0 0 0  Down, Depressed, Hopeless 0 0 0  PHQ - 2 Score 0 0 0     Social History   Tobacco Use  Smoking Status Former   Packs/day: 1.50   Years: 30.00   Total pack years: 45.00   Types: Cigarettes   Quit date: 10/20/2004   Years since quitting: 16.8  Smokeless Tobacco Never   BP Readings from Last 3 Encounters:  08/08/21 106/60  05/27/21 105/65  05/09/21 100/62   Pulse Readings from Last 3 Encounters:  08/08/21 78  05/27/21 73  05/09/21 78   Wt Readings from Last 3 Encounters:  08/08/21 (!) 308 lb 3.2 oz (139.8 kg)  05/27/21 (!) 304 lb 3.2 oz (138 kg)  05/09/21 (!) 301 lb (136.5 kg)   BMI Readings from Last 3 Encounters:  08/08/21 42.99 kg/m  05/27/21 42.43 kg/m  05/09/21 41.98 kg/m    Assessment/Interventions: Review of patient past medical history, allergies, medications, health status, including review of consultants reports, laboratory and other test data, was performed as part of comprehensive evaluation and provision of chronic care management services.   SDOH:  (Social Determinants of Health) assessments and interventions performed: Yes  Financial Resource Strain: Low Risk  (11/15/2020)   Overall Financial Resource Strain (CARDIA)    Difficulty of Paying Living Expenses: Not hard at all   Food Insecurity: No Food Insecurity (11/15/2020)   Hunger Vital Sign    Worried About Running Out of Food in the Last Year: Never true    Ran Out of Food in the Last Year: Never true    SDOH Screenings   Alcohol Screen: Not on file  Depression (PHQ2-9): Low Risk  (11/15/2020)   Depression (PHQ2-9)    PHQ-2  Score: 0  Financial Resource Strain: Low Risk  (11/15/2020)   Overall Financial Resource Strain (CARDIA)    Difficulty of Paying Living Expenses: Not hard at all  Food Insecurity: No Food Insecurity (11/15/2020)   Hunger Vital Sign    Worried About Running Out of Food in the Last Year: Never true    Ran Out of Food in the Last Year: Never true  Housing: Low Risk  (11/15/2020)   Housing    Last Housing Risk Score: 0  Physical Activity: Insufficiently Active (11/15/2020)   Exercise Vital Sign    Days of Exercise per Week: 5 days    Minutes of Exercise per Session: 10 min  Social Connections: Socially Isolated (11/15/2020)   Social Connection and Isolation Panel [NHANES]    Frequency of Communication with Friends and Family: More than three times a week    Frequency of Social Gatherings with Friends and Family: More than three times a week    Attends Religious Services: Never    Marine scientist or Organizations: No    Attends Archivist Meetings: Never    Marital Status: Never married  Stress: No Stress Concern Present (11/15/2020)   [employer] [place] of Stress : Not at all  Tobacco Use: Medium Risk (08/08/2021)   Patient History    Smoking Tobacco Use: Former    Smokeless Tobacco Use: Never    Passive Exposure: Not on file  Transportation Needs: No Transportation Needs (11/15/2020)   PRAPARE - Hydrologist (Medical): No    Lack of Transportation (Non-Medical): No    CCM Care Plan  Allergies  Allergen Reactions  Empagliflozin Rash    Patient reports yeast.    Ibuprofen Other (See Comments)    Stomach pain    Medications Reviewed Today     Reviewed by Edythe Clarity, [facility] (Pharmacist) on 08/14/21 at 1016  Med List Status: <None>   Medication Order Taking? Sig Documenting Provider Last Dose Status Informant  aspirin EC 81 MG tablet [id number] Yes Take 81 mg by  mouth daily. [provider] Taking Active Self  azithromycin (ZITHROMAX) 250 MG tablet [id number] Yes Take 2 tablets on first day, then 1 tablet daily until finished. Deneise Lever, MD Taking Active   B-D ULTRAFINE III SHORT PEN 31G X 8 MM MISC [id number] Yes  [provider] Taking Active Self  chlorhexidine (HIBICLENS) 4 % external liquid [id number] Yes Apply topically daily as needed. [name], MD Taking Active   collagenase Annitta Needs) ointment [id number] Yes Apply 1 application topically daily. Right ankle measures 6.0 x 0.3cm. Evelina Bucy, DPM Taking Active Self  diclofenac Sodium (VOLTAREN) 1 % GEL [id number] Yes APPLY 2 GRAMS TOPICALLY 4 TIMES DAILY TO KNEE [name], MD Taking Active   eplerenone (INSPRA) 25 MG tablet [id number] Yes TAKE 1 TABLET(25 MG) BY MOUTH DAILY [name], MD Taking Active   famotidine (PEPCID) 20 MG tablet [id number] Yes TAKE 1 TABLET(20 MG) BY MOUTH DAILY [name], MD Taking Active   fluticasone (FLONASE) 50 MCG/ACT nasal spray [id number] Yes Place 1 spray into both nostrils daily. [name], MD Taking Active   Fluticasone-Umeclidin-[facility] ELLIPTA) 100-62.5-25 MCG/ACT AEPB [id number] Yes Inhale 1 puff into the lungs daily. Baird Lyons D, MD Taking Active   glucose blood [facility] ULTRA) test strip [id number] Yes Use as instructed Allwardt, Randa Evens, PA-C Taking Active   guaiFENesin-codeine 100-10 MG/5ML syrup [id number] Yes Take 5 mLs by mouth 3 (three) times daily as needed for cough. [name], MD Taking Active   HUMALOG 100 UNIT/ML injection [id number] Yes Inject into the skin. [provider] Taking Active   HUMALOG KWIKPEN 200 UNIT/ML KwikPen [id number] Yes Inject into the skin. [provider] Taking Active   HUMULIN R U-500 KWIKPEN 500 UNIT/ML Claiborne Rigg [id number] Yes Inject 50-60 Units into the skin See admin instructions. 60 units in the AM, 50 units at lunch and 50 in the evening  [name], MD Taking Active   ipratropium-albuterol (DUONEB) 0.5-2.5 (3) MG/3ML Bailey Mech [id number] Yes 1 vial in nebulizer up to 4 times daily Deneise Lever, MD Taking Active   Lancets (ACCU-CHEK MULTICLIX) lancets [id number] Yes  [provider] Taking Active Self  LANTUS SOLOSTAR 100 UNIT/ML Solostar Pen [id number] Yes Inject 55 Units into the skin every morning. [provider] Taking Active   losartan (COZAAR) 50 MG tablet [id number] Yes Take 0.5 tablets (25 mg total) by mouth daily. [name] T, PA-C Taking Active   MAGNESIUM-OXIDE 400 (241.3 Mg) MG tablet [id number] Yes Take 1 tablet by mouth 2 (two) times daily. [provider] Taking Active   metoprolol tartrate (LOPRESSOR) 50 MG tablet [id number] Yes TAKE 1 TABLET(50 MG) BY MOUTH TWICE DAILY Skains, Thana Farr, MD Taking Active   nitroGLYCERIN (NITROSTAT) 0.4 MG SL tablet [id number] Yes Place 1 tablet (0.4 mg total) under the tongue every 5 (five) minutes as needed for chest pain. [name] T, PA-C Taking Active   nystatin ointment (MYCOSTATIN) [id number] Yes Apply 1 application topically 2 (two) times daily. [name], MD Taking Active   ofloxacin ([place])  0.3 % ophthalmic solution [id number] Yes Place 1 drop into the right eye 4 (four) times daily. [provider] Taking Active   polyethylene glycol (MIRALAX / GLYCOLAX) 17 g packet [id number] Yes Take 17 g by mouth daily. Bonnielee Haff, MD Taking Active   potassium chloride SA (KLOR-CON) 20 MEQ tablet [id number] Yes TAKE 2 TABLETS(40 MEQ) BY MOUTH THREE TIMES DAILY [name], MD Taking Active   prednisoLONE acetate (PRED FORTE) 1 % ophthalmic suspension [id number] Yes Place 1 drop into the right eye 4 (four) times daily. [provider] Taking Active   predniSONE (DELTASONE) 20 MG tablet [id number]  Take 2 tablets (40 mg total) by mouth daily with breakfast for 5 days. Clayton Bibles, NP  Expired 08/13/21 2359   pregabalin (LYRICA)  50 MG capsule [id number] Yes Take 1 capsule (50 mg total) by mouth at bedtime as needed (nerve pain). Gregor Hams, MD Taking Active   PROAIR HFA [phone] Base) MCG/ACT inhaler [id number] Yes INHALE 2 PUFFS INTO THE LUNGS EVERY 6 HOURS AS NEEDED FOR WHEEZING OR SHORTNESS OF BREATH Young, Kasandra Knudsen, MD Taking Active   rosuvastatin (CRESTOR) 20 MG tablet [id number] Yes Take 1 tablet (20 mg total) by mouth every evening. 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 Buddy Duty 10/14/2018   Chronic heart failure with preserved ejection fraction ([place]) 08/29/2017   Ectopic atrial rhythm 05/10/2017   Varicose vein of leg 04/13/2017   Polyneuropathy associated with underlying disease ([place])    Osteoarthritis 01/21/2017   Seasonal and perennial allergic rhinitis 07/17/2016   Ulnar neuritis, left 03/27/2016   Osteoarthritis of spine with radiculopathy, cervical region 03/27/2016   PVD (peripheral vascular disease) ([place]) 04/30/2009   Morbid obesity ([place])    HLD (hyperlipidemia) 01/04/2007   Hepatic cirrhosis ([place]) 01/04/2007   Obstructive sleep apnea 01/04/2007   HTN (hypertension) 01/04/2007    Immunization History  Administered Date(s) Administered   Influenza Split  01/29/2009, 11/19/2009, 11/11/2010, 10/23/2011, 11/10/2012, 01/31/2013, 01/24/2014, 10/20/2014   Influenza, High Dose Seasonal PF 11/02/2020   Influenza,inj,Quad PF,6+ Mos 11/10/2013, 11/30/2015, 12/10/2016   Influenza-Unspecified 11/30/2015, 11/01/2020   PFIZER(Purple Top)SARS-COV-2 Vaccination 05/17/2019, 06/06/2019   Pneumococcal Polysaccharide-23 06/26/2008   Tdap 09/01/2019    Conditions to be addressed/monitored:  HTN, HF, CAD, Type II DM w/ Neuropathy, HLD  Care Plan : General Pharmacy (Adult)  Updates made by Edythe Clarity, RPH since 08/14/2021 12:00 AM     Problem: HTN, HF, CAD, Type II DM w/ Neuropathy, HLD   Priority: High  Onset Date: 05/14/2021     Long-Range Goal: Patient-Specific Goal   Start Date: 05/14/2021  Expected End Date: 11/13/2021  Recent Progress: On track  Priority: High  Note:   Current Barriers:  Unable to achieve control of glucose   Pharmacist Clinical Goal(s):  Patient will achieve control of glucose as evidenced by A1c through collaboration with PharmD and provider.   Interventions: 1:1 collaboration with [name], MD regarding development and update of comprehensive plan of care as evidenced by provider attestation and co-signature Inter-disciplinary care team collaboration (see longitudinal plan of care) Comprehensive medication review performed; medication list updated in electronic medical record  Hypertension (BP goal <130/80) -Controlled, not discussed today -Current treatment: Losartan 64m Appropriate, Effective, Safe, Accessible Metoprolol tartrate 560mBID Appropriate, Effective, Safe, Accessible -Medications previously tried: none noted  -Current home readings: "normal" -Current dietary habits: see DM -Current exercise habits: walking some at grocery store -Denies hypotensive/hypertensive symptoms -Educated on BP goals and benefits of medications for prevention of heart attack, stroke and kidney damage; Exercise goal of  150 minutes per week; Importance of home blood pressure monitoring; Symptoms of hypotension and importance of maintaining adequate hydration; -Counseled to monitor BP at home a few times per week, document, and provide log at future appointments -Recommended to continue current medication  Hyperlipidemia/CAD: (LDL goal < 70) -Controlled, based on recent LDL March 2023 -Current treatment: Rosuvastatin 2048maily Appropriate, Effective, Safe, Accessible -Medications previously tried: simvastatin  -Current dietary patterns: see DM -Educated on Cholesterol goals;  Benefits of statin for ASCVD risk reduction; Importance of limiting foods high in cholesterol; -Recommended to continue current medication Most recent LDL is well controlled - no adverse effects.  Reports adherence.  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%) 08/12/21 -Uncontrolled, still seeing JefDelrae Rendndo) -Current medications: Humulin U-500 60-50-50 tid with meals Appropriate, Query effective, Lantus 60 units once daily Appropriate, Query effective,  -Medications previously tried: Jardiance (yeast)  -Current home glucose readings fasting glucose: 190-240 post prandial glucose: 200-300s -Denies hypoglycemic/hyperglycemic symptoms -Current exercise: minimal, does walk some around grocery store -Educated on A1c and blood sugar goals; Complications of diabetes including kidney damage, retinal damage, and cardiovascular disease; Prevention and management of hypoglycemic episodes; Benefits of routine self-monitoring of blood sugar; Dietary education regarding carbs, sugars -Counseled to check feet daily and get yearly eye exams -He has cut back on his soda intake, now drinking one liter bottles and maybe only drinking a half per day.  Still working on cutting back completely. Has upcoming visit with Endocrine for A1c recheck.  Considerable time spent on dietary discusstion today. He is also taking a short course of  steroids. Recommend he continue to work on diet - also really need to implement exercise. Continue current medication until A1c recheck.   Initial Visit - March 2023 I believe his intake of sodas alone is a large part of elevated glucose.  He is working on switching to the zero sugar sodas.  Also discussed carbs in other dietary sources.  He was concerned that his sugar would drop to low.  Explained that we could decrease insulin if sugar started dropping too low. Agreeable to work on sodas, will follow closely.  Recommend recheck A1c in 3 months. -Current meal patterns:  breakfast: eggs, grits, sausage links  lunch: salad, tur[place]ndwich  dinner: baked chicken, rice w/ broccoli drinks: sodas (4-6 12oz per day), water  Heart Failure (Goal: manage symptoms and prevent exacerbations) -Controlled, not assessed today -Last ejection fraction: 50-55% -Current treatment: Losartan 24m58mpropriate, Effective, Safe, Accessible Eplerenone 24mg48mropriate, Effective, Safe, Accessible Metoprolol tartrate 50mg 87mAppropriate, Effective, Safe, Accessible Torsemide 20mg A97mpriate, Effective, Safe,  Accessible -Medications previously tried: none noted  -Current home BP/HR readings: "normal" -Current dietary habits: working on processed foods and salt intake -Educated on Benefits of medications for managing symptoms and prolonging life Importance of weighing daily; if you gain more than 3 pounds in one day or 5 pounds in one week, contact providers -Recommended to continue current medication Monitor weights and SOB for fluid retention.  Patient Goals/Self-Care Activities Patient will:  - take medications as prescribed as evidenced by patient report and record review check glucose daily, document, and provide at future appointments engage in dietary modifications by decreasing regular soda intake  Follow Up Plan: The care management team will reach out to the patient again over the next 90 days.            Medication Assistance: None required.  Patient affirms current coverage meets needs.  Compliance/Adherence/Medication fill history: Care Gaps: Eye exam, colonopscopy.  Consider in house eye exam via mobile service.  Star-Rating Drugs: Rosuvastatin 20 mg last filled 07/26/21 90 DS  Patient's preferred pharmacy is:  [facility] [place], [address] [address] Phone: [phone] Fax: [phone]  [place], [place] [address] [place] [address] Phone: [phone] Fax: [phone]  [facility] [place], [address] [place] [address] Phone: [phone] Fax: [phone]  [id number] [address] [address] [place]Phone: [phone]: [phone] Uses pill box? Yes Pt endorses 100% compliance  We discussed: Benefits of medication synchronization, packaging and delivery as well as enhanced pharmacist oversight with [employer]. Patient decided to: Continue current medication management strategy  Care Plan and Follow Up Patient Decision:  Patient agrees to Care Plan and Follow-up.  Plan: The care management team will reach out to the patient again over the next 90 days.  CBeverly Milch PharmD Clinical Pharmacist  [phone]

## 2021-08-12 NOTE — Telephone Encounter (Signed)
Called and spoke with patient, he states he has not  yet started his CPAP machine, he states he will start it back tonight.  Asked that he see if he has an SD card in his machine and if so to bring it to his appointment.  He verbalized understanding.

## 2021-08-13 ENCOUNTER — Other Ambulatory Visit: Payer: Self-pay | Admitting: Cardiology

## 2021-08-13 ENCOUNTER — Telehealth: Payer: Medicare Other

## 2021-08-14 ENCOUNTER — Ambulatory Visit: Payer: Medicare Other | Admitting: Nurse Practitioner

## 2021-08-14 NOTE — Patient Instructions (Addendum)
Visit Information   Goals Addressed             This Visit's Progress    Monitor and Manage My Blood Sugar-Diabetes Type 2   On track    Timeframe:  Long-Range Goal Priority:  High Start Date:   05/14/21                          Expected End Date: 11/13/21                      Follow Up Date 08/13/21    - check blood sugar before and after exercise - check blood sugar if I feel it is too high or too low - take the blood sugar log to all doctor visits    Why is this important?   Checking your blood sugar at home helps to keep it from getting very high or very low.  Writing the results in a diary or log helps the doctor know how to care for you.  Your blood sugar log should have the time, date and the results.  Also, write down the amount of insulin or other medicine that you take.  Other information, like what you ate, exercise done and how you were feeling, will also be helpful.     Notes:   05/14/21 - working on decreasing regular sodas!       Patient Care Plan: General Pharmacy (Adult)     Problem Identified: HTN, HF, CAD, Type II DM w/ Neuropathy, HLD   Priority: High  Onset Date: 05/14/2021     Long-Range Goal: Patient-Specific Goal   Start Date: 05/14/2021  Expected End Date: 11/13/2021  Recent Progress: On track  Priority: High  Note:   Current Barriers:  Unable to achieve control of glucose   Pharmacist Clinical Goal(s):  Patient will achieve control of glucose as evidenced by A1c through collaboration with PharmD and provider.   Interventions: 1:1 collaboration with [name], MD regarding development and update of comprehensive plan of care as evidenced by provider attestation and co-signature Inter-disciplinary care team collaboration (see longitudinal plan of care) Comprehensive medication review performed; medication list updated in electronic medical record  Hypertension (BP goal <130/80) -Controlled, not discussed today -Current  treatment: Losartan '25mg'$  Appropriate, Effective, Safe, Accessible Metoprolol tartrate '50mg'$  BID Appropriate, Effective, Safe, Accessible -Medications previously tried: none noted  -Current home readings: "normal" -Current dietary habits: see DM -Current exercise habits: walking some at grocery store -Denies hypotensive/hypertensive symptoms -Educated on BP goals and benefits of medications for prevention of heart attack, stroke and kidney damage; Exercise goal of 150 minutes per week; Importance of home blood pressure monitoring; Symptoms of hypotension and importance of maintaining adequate hydration; -Counseled to monitor BP at home a few times per week, document, and provide log at future appointments -Recommended to continue current medication  Hyperlipidemia/CAD: (LDL goal < 70) -Controlled, based on recent LDL March 2023 -Current treatment: Rosuvastatin '20mg'$  daily Appropriate, Effective, Safe, Accessible -Medications previously tried: simvastatin  -Current dietary patterns: see DM -Educated on Cholesterol goals;  Benefits of statin for ASCVD risk reduction; Importance of limiting foods high in cholesterol; -Recommended to continue current medication Most recent LDL is well controlled - no adverse effects.  Reports adherence.  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%) 08/12/21 -Uncontrolled, still seeing Delrae Rend (Endo) -Current medications: Humulin U-500 60-50-50 tid with meals Appropriate, Query effective, Lantus 60 units once daily Appropriate, Query  effective,  -Medications previously tried: Jardiance (yeast)  -Current home glucose readings fasting glucose: 190-240 post prandial glucose: 200-300s -Denies hypoglycemic/hyperglycemic symptoms -Current exercise: minimal, does walk some around grocery store -Educated on A1c and blood sugar goals; Complications of diabetes including kidney damage, retinal damage, and cardiovascular disease; Prevention and management of  hypoglycemic episodes; Benefits of routine self-monitoring of blood sugar; Dietary education regarding carbs, sugars -Counseled to check feet daily and get yearly eye exams -He has cut back on his soda intake, now drinking one liter bottles and maybe only drinking a half per day.  Still working on cutting back completely. Has upcoming visit with Endocrine for A1c recheck.  Considerable time spent on dietary discusstion today. He is also taking a short course of steroids. Recommend he continue to work on diet - also really need to implement exercise. Continue current medication until A1c recheck.   Initial Visit - March 2023 I believe his intake of sodas alone is a large part of elevated glucose.  He is working on switching to the zero sugar sodas.  Also discussed carbs in other dietary sources.  He was concerned that his sugar would drop to low.  Explained that we could decrease insulin if sugar started dropping too low. Agreeable to work on sodas, will follow closely.  Recommend recheck A1c in 3 months. -Current meal patterns:  breakfast: eggs, grits, sausage links  lunch: salad, [place] sandwich  dinner: baked chicken, rice w/ broccoli drinks: sodas (4-6 12oz per day), water  Heart Failure (Goal: manage symptoms and prevent exacerbations) -Controlled, not assessed today -Last ejection fraction: 50-55% -Current treatment: Losartan '25mg'$  Appropriate, Effective, Safe, Accessible Eplerenone '25mg'$  Appropriate, Effective, Safe, Accessible Metoprolol tartrate '50mg'$  BID Appropriate, Effective, Safe, Accessible Torsemide '20mg'$  Appropriate, Effective, Safe, Accessible -Medications previously tried: none noted  -Current home BP/HR readings: "normal" -Current dietary habits: working on processed foods and salt intake -Educated on Benefits of medications for managing symptoms and prolonging life Importance of weighing daily; if you gain more than 3 pounds in one day or 5 pounds in one week, contact  providers -Recommended to continue current medication Monitor weights and SOB for fluid retention.  Patient Goals/Self-Care Activities Patient will:  - take medications as prescribed as evidenced by patient report and record review check glucose daily, document, and provide at future appointments engage in dietary modifications by decreasing regular soda intake  Follow Up Plan: The care management team will reach out to the patient again over the next 90 days.          The patient verbalized understanding of instructions, educational materials, and care plan provided today and DECLINED offer to receive copy of patient instructions, educational materials, and care plan.  Telephone follow up appointment with pharmacy team member scheduled for: 3 months  Edythe Clarity, [place], PharmD Clinical Pharmacist  [facility] [phone]

## 2021-08-20 DIAGNOSIS — M545 Low back pain, unspecified: Secondary | ICD-10-CM | POA: Diagnosis not present

## 2021-08-20 DIAGNOSIS — R269 Unspecified abnormalities of gait and mobility: Secondary | ICD-10-CM | POA: Diagnosis not present

## 2021-08-20 DIAGNOSIS — M6281 Muscle weakness (generalized): Secondary | ICD-10-CM | POA: Diagnosis not present

## 2021-08-20 DIAGNOSIS — R296 Repeated falls: Secondary | ICD-10-CM | POA: Diagnosis not present

## 2021-08-21 ENCOUNTER — Telehealth: Payer: Self-pay | Admitting: Cardiology

## 2021-08-21 DIAGNOSIS — I25708 Atherosclerosis of coronary artery bypass graft(s), unspecified, with other forms of angina pectoris: Secondary | ICD-10-CM

## 2021-08-21 MED ORDER — NITROGLYCERIN 0.4 MG SL SUBL: 0.4000 mg | SUBLINGUAL_TABLET | SUBLINGUAL | 3 refills | Status: DC | PRN

## 2021-08-21 NOTE — Telephone Encounter (Signed)
Pt c/o of Chest Pain: STAT if CP now or developed within 24 hours  1. Are you having CP right now?  No. Patient denies having had any CP. He states he has been experiencing a sharp pain and tightness in between his shoulder blades and his back. He mentions his HR is fine--ranges 74-95 (resting to standing). Patient assumes he may be sleeping incorrectly or he may have pulled a muscle due to lifting a cart on the back of his truck.   2. Are you experiencing any other symptoms (ex. SOB, nausea, vomiting, sweating)?  Indigestion   3. How long have you been experiencing CP?  Past few days   4. Is your CP continuous or coming and going?  Coming and going  5. Have you taken Nitroglycerin?  No, but patient states he took Tylenol and it relieved his pain ?

## 2021-08-21 NOTE — Telephone Encounter (Signed)
Called patient about message. Patient stated that he has been having back pain between his shoulder blades and on the right side under his arm. Patient stated pain is relieved with Tylenol. Patient denies any chest pain or SOB. Patient stated he does have some indigestion. 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 Patient stated no. Informed patient that his last couple of stress test, echo, and chest xray have been fine. Encouraged patient to call his PCP about his symptoms. Will forward to Dr. Marlou Porch for further advisement.

## 2021-08-23 NOTE — Telephone Encounter (Signed)
See note

## 2021-08-23 NOTE — Telephone Encounter (Signed)
Patient called to see if Dr. [name] could call in a medication for back pain as explained in messages below. I was able to schedule patient for 08/30/21 at 10:20am for further evaluation. Is there something that can be sent in the meantime?

## 2021-08-23 NOTE — Telephone Encounter (Signed)
Recommend he come in for office visit.  [name] [name]. [name], MD 08/23/2021 12:58 PM

## 2021-08-26 NOTE — Telephone Encounter (Signed)
Please schedule appointment with PCP.

## 2021-08-27 ENCOUNTER — Ambulatory Visit (INDEPENDENT_AMBULATORY_CARE_PROVIDER_SITE_OTHER): Payer: Medicare Other | Admitting: Nurse Practitioner

## 2021-08-27 ENCOUNTER — Encounter: Payer: Self-pay | Admitting: Nurse Practitioner

## 2021-08-27 ENCOUNTER — Ambulatory Visit (INDEPENDENT_AMBULATORY_CARE_PROVIDER_SITE_OTHER)
Admission: RE | Admit: 2021-08-27 | Discharge: 2021-08-27 | Disposition: A | Discharge status: Home / Self Care | Payer: Medicare Other | Source: Ambulatory Visit | Attending: Nurse Practitioner | Admitting: Nurse Practitioner

## 2021-08-27 VITALS — BP 102/60 | HR 79 | Temp 97.9°F | Ht 71.0 in | Wt 301.0 lb

## 2021-08-27 DIAGNOSIS — J302 Other seasonal allergic rhinitis: Secondary | ICD-10-CM

## 2021-08-27 DIAGNOSIS — Z01818 Encounter for other preprocedural examination: Secondary | ICD-10-CM | POA: Diagnosis not present

## 2021-08-27 DIAGNOSIS — R059 Cough, unspecified: Secondary | ICD-10-CM | POA: Diagnosis not present

## 2021-08-27 DIAGNOSIS — J3089 Other allergic rhinitis: Secondary | ICD-10-CM | POA: Diagnosis not present

## 2021-08-27 DIAGNOSIS — G4733 Obstructive sleep apnea (adult) (pediatric): Secondary | ICD-10-CM

## 2021-08-27 DIAGNOSIS — R0781 Pleurodynia: Secondary | ICD-10-CM | POA: Diagnosis not present

## 2021-08-27 DIAGNOSIS — J410 Simple chronic bronchitis: Secondary | ICD-10-CM | POA: Diagnosis not present

## 2021-08-27 DIAGNOSIS — R079 Chest pain, unspecified: Secondary | ICD-10-CM | POA: Diagnosis not present

## 2021-08-27 DIAGNOSIS — I5032 Chronic diastolic (congestive) heart failure: Secondary | ICD-10-CM

## 2021-08-27 MED ORDER — LIDOCAINE 5 % EX PTCH: 1.0000 | MEDICATED_PATCH | CUTANEOUS | 0 refills | Status: DC

## 2021-08-27 NOTE — Assessment & Plan Note (Signed)
Possibly costochondritis due to persistent coughing. We will check rib xray today to ensure he does not have any fractured ribs. Rx for lidocaine patches PRN pain since he is unable to take NSAIDs.

## 2021-08-27 NOTE — Assessment & Plan Note (Signed)
BLE edema, slightly improved when compared to previous visit. No evidence of pulmonary edema on previous CXR. He never had BNP drawn and symptoms have resolved today. He is on torsemide 20 mg Twice daily and reports compliance with this. Advised he follow up with cardiology if swelling persists or worsens.

## 2021-08-27 NOTE — Assessment & Plan Note (Signed)
Well controlled on flonase currently.

## 2021-08-27 NOTE — Assessment & Plan Note (Signed)
Acute exacerbation resolved with z pack and prednisone. He has been better since restarting Trelegy. Rare use of albuterol. Previous CXR without acute process. Continue Trelegy daily and PRN albuterol.   Patient Instructions  -Continue Trelegy 1 puff daily. Brush tongue and rinse mouth afterwards. This is your maintenance inhaler and you should be using it daily. -Continue Albuterol inhaler 2 puffs or duoneb 3 mL neb every 6 hours as needed for shortness of breath or wheezing. Notify if symptoms persist despite rescue inhaler/neb use. This is your rescue inhaler -Continue pepcid 20 mg daily -Continue flonase 2 sprays each nostril daily   Restart your CPAP nightly, minimum of 4-6 hours. We discussed how untreated sleep apnea puts an individual at risk for cardiac arrhthymias, pulm HTN, DM, stroke and increases their risk for daytime accidents. This is very important!!   Rib x ray today  Apply lidocaine patch every 24 hours in AM. Remove after 12 hours then reapply a new patch the next morning.   Follow up with your primary care doctor to repeat labs and talk about your side pain   Take your inhalers with you to surgery. Out of bed as soon as possible after surgery is complete. Use incentive spirometer every hour while awake.    Follow up in 3 months with Dr. Annamaria Boots. If symptoms do not improve or worsen, please contact office for sooner follow up or seek emergency care.

## 2021-08-27 NOTE — Assessment & Plan Note (Signed)
Moderate risk given he is not undergoing general anesthesia. Factors that increase the risk for postoperative pulmonary complications are uncontrolled OSA, obesity, age  Respiratory complications generally occur in 1% of ASA Class I patients, 5% of ASA Class II and 10% of ASA Class III-IV patients These complications rarely result in mortality and include postoperative pneumonia, atelectasis, pulmonary embolism, ARDS and increased time requiring postoperative mechanical ventilation.   Overall, I recommend proceeding with the surgery if the risk for respiratory complications are outweighed by the potential benefits. This will need to be discussed between the patient and surgeon.   To reduce risks of respiratory complications, I recommend: --Pre- and post-operative incentive spirometry performed frequently while awake --Inpatient use of currently prescribed positive-pressure for OSA whenever the patient is sleeping --.Short duration of surgery as much as possible and avoid paralytic if possible --OOB, encourage mobility post-op

## 2021-08-27 NOTE — Progress Notes (Signed)
$'@Patient'k$  ID: [name] [name], male    DOB: 1955-05-09, 66 y.o.   MRN: [id number]  Chief Complaint  Patient presents with   Follow-up    Patient says everything is going good but he has some pain on his right side.     Referring provider: [name], MD  HPI: 66 year old male, former smoker followed for chronic bronchitis and OSA on CPAP.  He is a patient Dr. Janee Morn and last seen in office 08/08/2021 by [facility] NP.  Past medical history significant for PVD, hypertension, CHF, CAD status post CABG, allergies, DM 2, cirrhosis, OA, morbid obesity, HLD.  TEST/EVENTS:  08/05/2016 HST: AHI 37.5/h, SPO2 low 67% 09/08/2018 HST: AHI 32.4, SPO2 low 73%.  Severe obstructive sleep apnea. 12/17/2020 CXR 2 view: Cardiomegaly with prior CABG.  There is mild central pulmonary vascular prominence and persistent mild central peribronchial thickening.  No acute process 05/20/2021 PFTs: FVC 50, FEV1 47, ratio 76, TLC 71, DLCOcor 31  01/16/2021: OV with Dr. Annamaria Boots.  Treated in November for bronchitis and rhinitis with Flonase, Zyrtec, saline spray, prednisone taper, Mucinex.  He has had a persistent cough since July after having COVID.  PFTs were ordered.  He has not been using nebulizer or CPAP machine; they are still in storage since he moved.  Symptoms consistent with chronic bronchitis and likely aggravated by COVID infection.  Recommended he could start on pressure Trelegy if this problem persisted -sample provided.  Emphasized importance of compliance with CPAP therapy.  08/08/2021: OV with Emmilia Sowder NP after contacting the office earlier this week for increased cough with clear sputum production.  He was prescribed Z-Pak which she has 1 day left of.  He reports feeling somewhat better.  Cough is still present but not as prevalent as it was before.  He does note that his cough is worse at night and he also has some shortness of breath at night.  He has had some swelling in his lower extremities which is slightly  increased from baseline.  He feels like his breathing is not any worse from his baseline but he has been using his albuterol a couple times a day.  He has not been using the Trelegy although he does have a prescription for this.  He was a little unclear on the difference between the 2.  He denies any sick exposures, fevers, hemoptysis, wheezing, chest pain.  Treated with prednisone burst and recommended to restart inhaler therapies. BNP ordered but never completed. CXR without acute process. He has not been using his CPAP.  He was curious about inspire device but did not know if his weight would limit him from this. He is anticipated to have cataract surgery next week on his right eye.  He was told by the surgeon that he needed to be seen prior to this - advised he would need to return for surgical clearance given his acute symptoms.   08/27/2021: Today-follow-up Patient presents today with wife for follow-up after being treated for acute exacerbation of chronic bronchitis with prednisone and Z-Pak.  He was also advised to restart his Trelegy inhaler.  He reports feeling better today.  Cough has significantly improved and is not nearly as often as it was before.  He does still have an occasional cough, but it is back to his baseline.  Breathing does feel little bit better on Trelegy.  He still has an occasional wheeze but this seems to have improved as well.  He also still has some swelling  in his legs which has not gotten any worse and is similar to baseline for him.  He does have some new right-sided rib pain that is worse with certain movements and coughing.  It is also tender when he pushes on the lateral side of his right chest.  Started around 1.5 to 2 weeks ago.  He does not recall any specific injury/trauma to the area.  He has not tried anything over-the-counter for the pain.  He is unable to take any NSAIDs.    He is anticipated to have cataract surgery coming up.  He is going to have this done at a  surgical center and will be awake for the procedure.  No plans for general anesthesia.  Allergies  Allergen Reactions   Empagliflozin Rash    Patient reports yeast.    Ibuprofen Other (See Comments)    Stomach pain    Immunization History  Administered Date(s) Administered   Influenza Split 01/29/2009, 11/19/2009, 11/11/2010, 10/23/2011, 11/10/2012, 01/31/2013, 01/24/2014, 10/20/2014   Influenza, High Dose Seasonal PF 11/02/2020   Influenza,inj,Quad PF,6+ Mos 11/10/2013, 11/30/2015, 12/10/2016   Influenza-Unspecified 11/30/2015, 11/01/2020   PFIZER(Purple Top)SARS-COV-2 Vaccination 05/17/2019, 06/06/2019   Pneumococcal Polysaccharide-23 06/26/2008   Tdap 09/01/2019    Past Medical History:  Diagnosis Date   Acute myocardial infarction, unspecified site, episode of care unspecified 2005   Acute pancreatitis    CAD (coronary artery disease)    a. CABG in 2005 w LIMA to LAD, left radial to second circumflex marginal, saphenous vein graft to PDA, saphenous vein graft to lateral subbrach of ramus intermediate, and sequential saphenous vein graft to the medial subbranch of ramus intermediate.   Chronic diastolic CHF (congestive heart failure) (HCC)    Cirrhosis of liver without mention of alcohol    CKD (chronic kidney disease), stage II    Complications affecting other specified body systems, hypertension    Ectopic atrial rhythm    Essential hypertension    Hyperlipidemia    Hypokalemia    a. intermitttent noncompliance with potassium supplement.   Hypomagnesemia    Morbid obesity (HCC)    Neuropathy    Other and unspecified hyperlipidemia    Proteinuria    RBBB    Sleep apnea    Type II or unspecified type diabetes mellitus without mention of complication, not stated as uncontrolled    Varicose veins of both lower extremities     Tobacco History: Social History   Tobacco Use  Smoking Status Former   Packs/day: 1.50   Years: 30.00   Total pack years: 45.00   Types:  Cigarettes   Quit date: 10/20/2004   Years since quitting: 16.8  Smokeless Tobacco Never   Counseling given: Not Answered   Outpatient Medications Prior to Visit  Medication Sig Dispense Refill   aspirin EC 81 MG tablet Take 81 mg by mouth daily.     azithromycin (ZITHROMAX) 250 MG tablet Take 2 tablets on first day, then 1 tablet daily until finished. 6 tablet 0   B-D ULTRAFINE III SHORT PEN 31G X 8 MM MISC   1   chlorhexidine (HIBICLENS) 4 % external liquid Apply topically daily as needed. 120 mL 0   collagenase (SANTYL) ointment Apply 1 application topically daily. Right ankle measures 6.0 x 0.3cm. 30 g 5   diclofenac Sodium (VOLTAREN) 1 % GEL APPLY 2 GRAMS TOPICALLY 4 TIMES DAILY TO KNEE 100 g 2   eplerenone (INSPRA) 25 MG tablet TAKE 1 TABLET(25 MG) BY MOUTH DAILY 90  tablet 2   famotidine (PEPCID) 20 MG tablet TAKE 1 TABLET(20 MG) BY MOUTH DAILY 90 tablet 2   fluticasone (FLONASE) 50 MCG/ACT nasal spray Place 1 spray into both nostrils daily. 16 g 12   Fluticasone-Umeclidin-Vilant (TRELEGY ELLIPTA) 100-62.5-25 MCG/ACT AEPB Inhale 1 puff into the lungs daily. 60 each 5   glucose blood (ONETOUCH ULTRA) test strip Use as instructed 800 strip 0   guaiFENesin-codeine 100-10 MG/5ML syrup Take 5 mLs by mouth 3 (three) times daily as needed for cough. 120 mL 0   HUMALOG 100 UNIT/ML injection Inject into the skin.     HUMALOG KWIKPEN 200 UNIT/ML KwikPen Inject into the skin.     HUMULIN R U-500 KWIKPEN 500 UNIT/ML kwikpen Inject 50-60 Units into the skin See admin instructions. 60 units in the AM, 50 units at lunch and 50 in the evening 3 mL 0   ipratropium-albuterol (DUONEB) 0.5-2.5 (3) MG/3ML SOLN 1 vial in nebulizer up to 4 times daily 90 mL 12   Lancets (ACCU-CHEK MULTICLIX) lancets      LANTUS SOLOSTAR 100 UNIT/ML Solostar Pen Inject 55 Units into the skin every morning.     losartan (COZAAR) 50 MG tablet Take 0.5 tablets (25 mg total) by mouth daily. 45 tablet 3   MAGNESIUM-OXIDE 400  (241.3 Mg) MG tablet Take 1 tablet by mouth 2 (two) times daily.     metoprolol tartrate (LOPRESSOR) 50 MG tablet TAKE 1 TABLET(50 MG) BY MOUTH TWICE DAILY 180 tablet 2   nitroGLYCERIN (NITROSTAT) 0.4 MG SL tablet Place 1 tablet (0.4 mg total) under the tongue every 5 (five) minutes as needed for chest pain. 25 tablet 3   nystatin ointment (MYCOSTATIN) Apply 1 application topically 2 (two) times daily. 30 g 0   ofloxacin (OCUFLOX) 0.3 % ophthalmic solution Place 1 drop into the right eye 4 (four) times daily.     polyethylene glycol (MIRALAX / GLYCOLAX) 17 g packet Take 17 g by mouth daily. 14 each 0   potassium chloride SA (KLOR-CON) 20 MEQ tablet TAKE 2 TABLETS(40 MEQ) BY MOUTH THREE TIMES DAILY 540 tablet 0   prednisoLONE acetate (PRED FORTE) 1 % ophthalmic suspension Place 1 drop into the right eye 4 (four) times daily.     pregabalin (LYRICA) 50 MG capsule Take 1 capsule (50 mg total) by mouth at bedtime as needed (nerve pain). 30 capsule 0   PROAIR HFA 108 (90 Base) MCG/ACT inhaler INHALE 2 PUFFS INTO THE LUNGS EVERY 6 HOURS AS NEEDED FOR WHEEZING OR SHORTNESS OF BREATH 8.5 g 5   rosuvastatin (CRESTOR) 20 MG tablet Take 1 tablet (20 mg total) by mouth every evening. 30 tablet 11   silver sulfADIAZINE (SSD) 1 % cream APPLY PEA SIZED AMOUNT TOPICALLY TO WOUND DAILY 50 g 0   torsemide (DEMADEX) 20 MG tablet TAKE 1 TABLET(20 MG) BY MOUTH TWICE DAILY 180 tablet 2   No facility-administered medications prior to visit.     Review of Systems:   Constitutional: No weight loss or gain, night sweats, fevers, chills, fatigue, or lassitude. HEENT: No headaches, difficulty swallowing, tooth/dental problems, or sore throat. No sneezing, itching, ear ache, nasal congestion, or post nasal drip CV:  +swelling in lower extremities, orthopnea (improved). No chest pain, PND, anasarca, dizziness, palpitations, syncope Resp: +shortness of breath with exertion (baseline); rare non-productive cough (baseline).  No excess mucus or change in color of mucus. No hemoptysis. No wheezing.  No chest wall deformity GI:  No heartburn, indigestion, abdominal pain, nausea,  vomiting, diarrhea, change in bowel habits, loss of appetite, bloody stools.  GU: No dysuria, change in color of urine, urgency or frequency.  No flank pain, no hematuria  Skin: No rash, lesions, ulcerations MSK:  +right rib pain. No joint pain or swelling.  No decreased range of motion.  No back pain. Neuro: No dizziness or lightheadedness.  Psych: No depression or anxiety. Mood stable.     Physical Exam:  BP 102/60 (BP Location: Right Arm, Patient Position: Sitting, Cuff Size: Large)   Pulse 79   Temp 97.9 F (36.6 C) (Oral)   Ht '5\' 11"'$  (1.803 m)   Wt (!) 301 lb (136.5 kg)   SpO2 94%   BMI 41.98 kg/m   GEN: Pleasant, interactive, well-appearing; morbidly obese; in no acute distress. HEENT:  Normocephalic and atraumatic. PERRLA. Sclera white. Nasal turbinates pink, moist and patent bilaterally. No rhinorrhea present. Oropharynx pink and moist, without exudate or edema. No lesions, ulcerations, or postnasal drip.  NECK:  Supple w/ fair ROM. No JVD present. Normal carotid impulses w/o bruits. Thyroid symmetrical with no goiter or nodules palpated. No lymphadenopathy.   CV: RRR, no m/r/g, non pitting BLE edema. Pulses intact, +2 bilaterally. No cyanosis, pallor or clubbing. PULMONARY:  Unlabored, regular breathing. Clear bilaterally A&P w/o wheeze/rales/rhonchi. No accessory muscle use. No dullness to percussion. GI: BS present and normoactive. Soft, non-tender to palpation. No organomegaly or masses detected. No CVA tenderness. MSK: Tenderness to right lateral chest wall without erythema, warmth. Cap refil <2 sec all extrem. No deformities or joint swelling noted.  Neuro: A/Ox3. No focal deficits noted.   Skin: Warm, no lesions or rashe Psych: Normal affect and behavior. Judgement and thought content appropriate.     Lab  Results:  CBC    Component Value Date/Time   WBC 6.8 05/09/2021 0957   WBC 5.0 09/02/2020 0309   RBC 4.19 05/09/2021 0957   RBC 4.46 09/02/2020 0309   HGB 14.4 05/09/2021 0957   HCT 40.0 05/09/2021 0957   PLT 235 05/09/2021 0957   MCV 96 05/09/2021 0957   MCH 34.4 (H) 05/09/2021 0957   MCH 33.2 09/02/2020 0309   MCHC 36.0 (H) 05/09/2021 0957   MCHC 33.7 09/02/2020 0309   RDW 12.7 05/09/2021 0957   LYMPHSABS 3.6 09/01/2019 2150   MONOABS 0.9 09/01/2019 2150   EOSABS 0.2 09/01/2019 2150   BASOSABS 0.1 09/01/2019 2150    BMET    Component Value Date/Time   NA 133 (L) 07/30/2021 1138   NA 135 05/09/2021 0957   K 3.4 (L) 07/30/2021 1138   CL 96 07/30/2021 1138   CO2 27 07/30/2021 1138   GLUCOSE 281 (H) 07/30/2021 1138   BUN 17 07/30/2021 1138   BUN 14 05/09/2021 0957   CREATININE 1.47 07/30/2021 1138   CREATININE 1.01 03/20/2017 1514   CALCIUM 9.3 07/30/2021 1138   GFRNONAA >60 09/02/2020 0309   GFRAA 65 12/12/2019 1212    BNP    Component Value Date/Time   BNP 30.3 03/24/2016 1519     Imaging:  DG Chest 2 View  Result Date: 08/08/2021 CLINICAL DATA:  Cough. EXAM: CHEST - 2 VIEW COMPARISON:  Chest x-ray dated December 17, 2020. FINDINGS: Unchanged mild cardiomegaly status post CABG. Chronic interstitial coarsening is similar. Normal pulmonary vascularity. No focal consolidation, pleural effusion, or pneumothorax. No acute osseous abnormality. IMPRESSION: 1. No acute cardiopulmonary disease. Electronically Signed   By: Titus Dubin M.D.   On: 08/08/2021 11:24  Latest Ref Rng & Units 05/20/2021   11:14 AM  PFT Results  FVC-Pre L 2.04   FVC-Predicted Pre % 50   FVC-Post L 1.77   FVC-Predicted Post % 43   Pre FEV1/FVC % % 72   Post FEV1/FCV % % 76   FEV1-Pre L 1.47   FEV1-Predicted Pre % 47   FEV1-Post L 1.34   DLCO uncorrected ml/min/mmHg 8.47   DLCO UNC% % 31   DLCO corrected ml/min/mmHg 8.47   DLCO COR %Predicted % 31   DLVA Predicted % 83    TLC L 5.17   TLC % Predicted % 71   RV % Predicted % 101     No results found for: "NITRICOXIDE"      Assessment & Plan:   Simple chronic bronchitis (HCC) Acute exacerbation resolved with z pack and prednisone. He has been better since restarting Trelegy. Rare use of albuterol. Previous CXR without acute process. Continue Trelegy daily and PRN albuterol.   Patient Instructions  -Continue Trelegy 1 puff daily. Brush tongue and rinse mouth afterwards. This is your maintenance inhaler and you should be using it daily. -Continue Albuterol inhaler 2 puffs or duoneb 3 mL neb every 6 hours as needed for shortness of breath or wheezing. Notify if symptoms persist despite rescue inhaler/neb use. This is your rescue inhaler -Continue pepcid 20 mg daily -Continue flonase 2 sprays each nostril daily   Restart your CPAP nightly, minimum of 4-6 hours. We discussed how untreated sleep apnea puts an individual at risk for cardiac arrhthymias, pulm HTN, DM, stroke and increases their risk for daytime accidents. This is very important!!   Rib x ray today  Apply lidocaine patch every 24 hours in AM. Remove after 12 hours then reapply a new patch the next morning.   Follow up with your primary care doctor to repeat labs and talk about your side pain   Take your inhalers with you to surgery. Out of bed as soon as possible after surgery is complete. Use incentive spirometer every hour while awake.    Follow up in 3 months with Dr. Annamaria Boots. If symptoms do not improve or worsen, please contact office for sooner follow up or seek emergency care.    Obstructive sleep apnea He has yet to restart CPAP therapy. He states that he will start back tonight. We again discussed how untreated sleep apnea puts an individual at risk for cardiac arrhthymias, pulm HTN, DM, stroke and increases their risk for daytime accidents. Also makes him a higher risk for surgery; although, he will not be going under general  anesthesia. Reviewed this with him. Verbalized understanding. Cautioned against drowsy driving. He is not a candidate for Inspire at this time due to BMI but he is working on weight loss measures so we can re-evaluate in the future.    Seasonal and perennial allergic rhinitis Well controlled on flonase currently.   Chronic heart failure with preserved ejection fraction (HCC) BLE edema, slightly improved when compared to previous visit. No evidence of pulmonary edema on previous CXR. He never had BNP drawn and symptoms have resolved today. He is on torsemide 20 mg Twice daily and reports compliance with this. Advised he follow up with cardiology if swelling persists or worsens.   Rib pain on right side Possibly costochondritis due to persistent coughing. We will check rib xray today to ensure he does not have any fractured ribs. Rx for lidocaine patches PRN pain since he is unable to take NSAIDs.  Preoperative clearance Moderate risk given he is not undergoing general anesthesia. Factors that increase the risk for postoperative pulmonary complications are uncontrolled OSA, obesity, age  Respiratory complications generally occur in 1% of ASA Class I patients, 5% of ASA Class II and 10% of ASA Class III-IV patients These complications rarely result in mortality and include postoperative pneumonia, atelectasis, pulmonary embolism, ARDS and increased time requiring postoperative mechanical ventilation.   Overall, I recommend proceeding with the surgery if the risk for respiratory complications are outweighed by the potential benefits. This will need to be discussed between the patient and surgeon.   To reduce risks of respiratory complications, I recommend: --Pre- and post-operative incentive spirometry performed frequently while awake --Inpatient use of currently prescribed positive-pressure for OSA whenever the patient is sleeping --.Short duration of surgery as much as possible and avoid  paralytic if possible --OOB, encourage mobility post-op    I spent 35 minutes of dedicated to the care of this patient on the date of this encounter to include pre-visit review of records, face-to-face time with the patient discussing conditions above, post visit ordering of testing, clinical documentation with the electronic health record, making appropriate referrals as documented, and communicating necessary findings to members of the patients care team.  Clayton Bibles, NP 08/27/2021  Pt aware and understands NP's role.

## 2021-08-27 NOTE — Assessment & Plan Note (Signed)
He has yet to restart CPAP therapy. He states that he will start back tonight. We again discussed how untreated sleep apnea puts an individual at risk for cardiac arrhthymias, pulm HTN, DM, stroke and increases their risk for daytime accidents. Also makes him a higher risk for surgery; although, he will not be going under general anesthesia. Reviewed this with him. Verbalized understanding. Cautioned against drowsy driving. He is not a candidate for Inspire at this time due to BMI but he is working on weight loss measures so we can re-evaluate in the future.

## 2021-08-27 NOTE — Patient Instructions (Addendum)
-  Continue Trelegy 1 puff daily. Brush tongue and rinse mouth afterwards. This is your maintenance inhaler and you should be using it daily. -Continue Albuterol inhaler 2 puffs or duoneb 3 mL neb every 6 hours as needed for shortness of breath or wheezing. Notify if symptoms persist despite rescue inhaler/neb use. This is your rescue inhaler -Continue pepcid 20 mg daily -Continue flonase 2 sprays each nostril daily   Restart your CPAP nightly, minimum of 4-6 hours. We discussed how untreated sleep apnea puts an individual at risk for cardiac arrhthymias, pulm HTN, DM, stroke and increases their risk for daytime accidents. This is very important!!   Rib x ray today  Apply lidocaine patch every 24 hours in AM. Remove after 12 hours then reapply a new patch the next morning.   Follow up with your primary care doctor to repeat labs and talk about your side pain   Take your inhalers with you to surgery. Out of bed as soon as possible after surgery is complete. Use incentive spirometer every hour while awake.    Follow up in 3 months with Dr. Annamaria Boots. If symptoms do not improve or worsen, please contact office for sooner follow up or seek emergency care.

## 2021-08-28 DIAGNOSIS — R269 Unspecified abnormalities of gait and mobility: Secondary | ICD-10-CM | POA: Diagnosis not present

## 2021-08-28 DIAGNOSIS — M545 Low back pain, unspecified: Secondary | ICD-10-CM | POA: Diagnosis not present

## 2021-08-28 DIAGNOSIS — R296 Repeated falls: Secondary | ICD-10-CM | POA: Diagnosis not present

## 2021-08-28 DIAGNOSIS — M6281 Muscle weakness (generalized): Secondary | ICD-10-CM | POA: Diagnosis not present

## 2021-08-28 NOTE — Progress Notes (Signed)
Please notify patient that his rib x ray from yesterday did not show any evidence of rib fractures. Use lidocaine patches as prescribed. Notify us or PCP if pain doesn't improve. Thanks.

## 2021-08-29 DIAGNOSIS — I739 Peripheral vascular disease, unspecified: Secondary | ICD-10-CM | POA: Diagnosis not present

## 2021-08-29 DIAGNOSIS — L603 Nail dystrophy: Secondary | ICD-10-CM | POA: Diagnosis not present

## 2021-08-29 DIAGNOSIS — J40 Bronchitis, not specified as acute or chronic: Secondary | ICD-10-CM | POA: Diagnosis not present

## 2021-08-29 DIAGNOSIS — J45998 Other asthma: Secondary | ICD-10-CM | POA: Diagnosis not present

## 2021-08-29 DIAGNOSIS — L84 Corns and callosities: Secondary | ICD-10-CM | POA: Diagnosis not present

## 2021-08-29 DIAGNOSIS — E1151 Type 2 diabetes mellitus with diabetic peripheral angiopathy without gangrene: Secondary | ICD-10-CM | POA: Diagnosis not present

## 2021-08-30 ENCOUNTER — Ambulatory Visit: Payer: Medicare Other | Admitting: Family Medicine

## 2021-09-05 ENCOUNTER — Telehealth: Payer: Self-pay | Admitting: Nurse Practitioner

## 2021-09-05 MED ORDER — PROAIR HFA 108 (90 BASE) MCG/ACT IN AERS: 2.0000 | INHALATION_SPRAY | Freq: Four times a day (QID) | RESPIRATORY_TRACT | 5 refills | Status: DC | PRN

## 2021-09-05 MED ORDER — TRELEGY ELLIPTA 100-62.5-25 MCG/ACT IN AEPB: 1.0000 | INHALATION_SPRAY | Freq: Every day | RESPIRATORY_TRACT | 5 refills | Status: DC

## 2021-09-05 NOTE — Telephone Encounter (Signed)
Called patient and went over medications he was needing refilled. Nothing further needed

## 2021-09-10 DIAGNOSIS — Z794 Long term (current) use of insulin: Secondary | ICD-10-CM | POA: Diagnosis not present

## 2021-09-10 DIAGNOSIS — E1142 Type 2 diabetes mellitus with diabetic polyneuropathy: Secondary | ICD-10-CM | POA: Diagnosis not present

## 2021-09-10 DIAGNOSIS — Z8719 Personal history of other diseases of the digestive system: Secondary | ICD-10-CM | POA: Diagnosis not present

## 2021-09-10 DIAGNOSIS — E1165 Type 2 diabetes mellitus with hyperglycemia: Secondary | ICD-10-CM | POA: Diagnosis not present

## 2021-09-13 DIAGNOSIS — R296 Repeated falls: Secondary | ICD-10-CM | POA: Diagnosis not present

## 2021-09-13 DIAGNOSIS — M545 Low back pain, unspecified: Secondary | ICD-10-CM | POA: Diagnosis not present

## 2021-09-13 DIAGNOSIS — M6281 Muscle weakness (generalized): Secondary | ICD-10-CM | POA: Diagnosis not present

## 2021-09-13 DIAGNOSIS — R269 Unspecified abnormalities of gait and mobility: Secondary | ICD-10-CM | POA: Diagnosis not present

## 2021-09-20 DIAGNOSIS — M545 Low back pain, unspecified: Secondary | ICD-10-CM | POA: Diagnosis not present

## 2021-09-20 DIAGNOSIS — R296 Repeated falls: Secondary | ICD-10-CM | POA: Diagnosis not present

## 2021-09-20 DIAGNOSIS — M6281 Muscle weakness (generalized): Secondary | ICD-10-CM | POA: Diagnosis not present

## 2021-09-20 DIAGNOSIS — R269 Unspecified abnormalities of gait and mobility: Secondary | ICD-10-CM | POA: Diagnosis not present

## 2021-09-24 DIAGNOSIS — M545 Low back pain, unspecified: Secondary | ICD-10-CM | POA: Diagnosis not present

## 2021-09-24 DIAGNOSIS — R296 Repeated falls: Secondary | ICD-10-CM | POA: Diagnosis not present

## 2021-09-24 DIAGNOSIS — R269 Unspecified abnormalities of gait and mobility: Secondary | ICD-10-CM | POA: Diagnosis not present

## 2021-09-24 DIAGNOSIS — M6281 Muscle weakness (generalized): Secondary | ICD-10-CM | POA: Diagnosis not present

## 2021-09-30 ENCOUNTER — Telehealth: Payer: Self-pay | Admitting: Internal Medicine

## 2021-09-30 DIAGNOSIS — J45998 Other asthma: Secondary | ICD-10-CM | POA: Diagnosis not present

## 2021-09-30 DIAGNOSIS — J40 Bronchitis, not specified as acute or chronic: Secondary | ICD-10-CM | POA: Diagnosis not present

## 2021-09-30 IMAGING — CR DG CHEST 2V
2 series · 2 of 2 positions shown · non-contrast
Comparison: October 13, 2018

CLINICAL DATA: Chest pain.

EXAM:
CHEST - 2 VIEW

[chest pa]
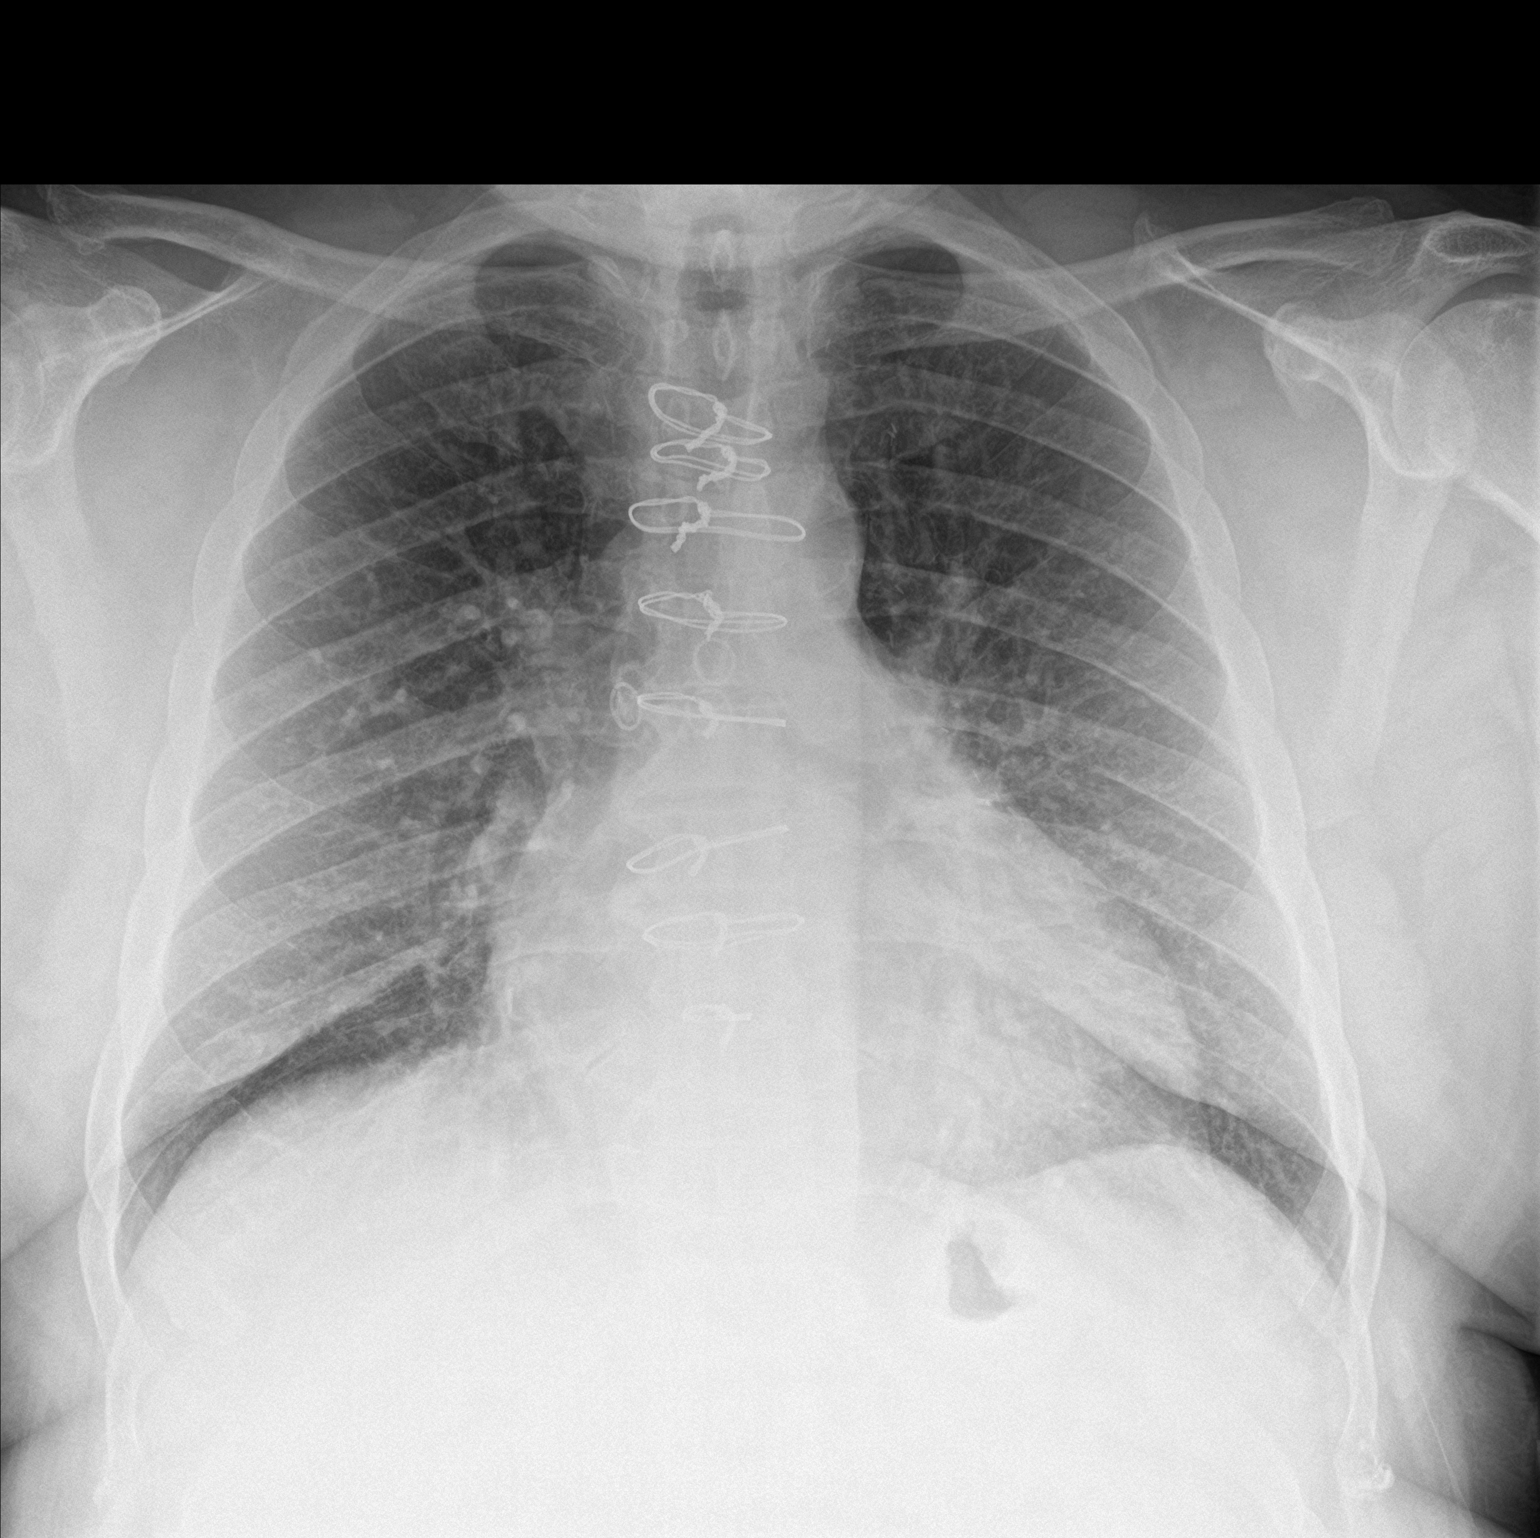

[chest lat]
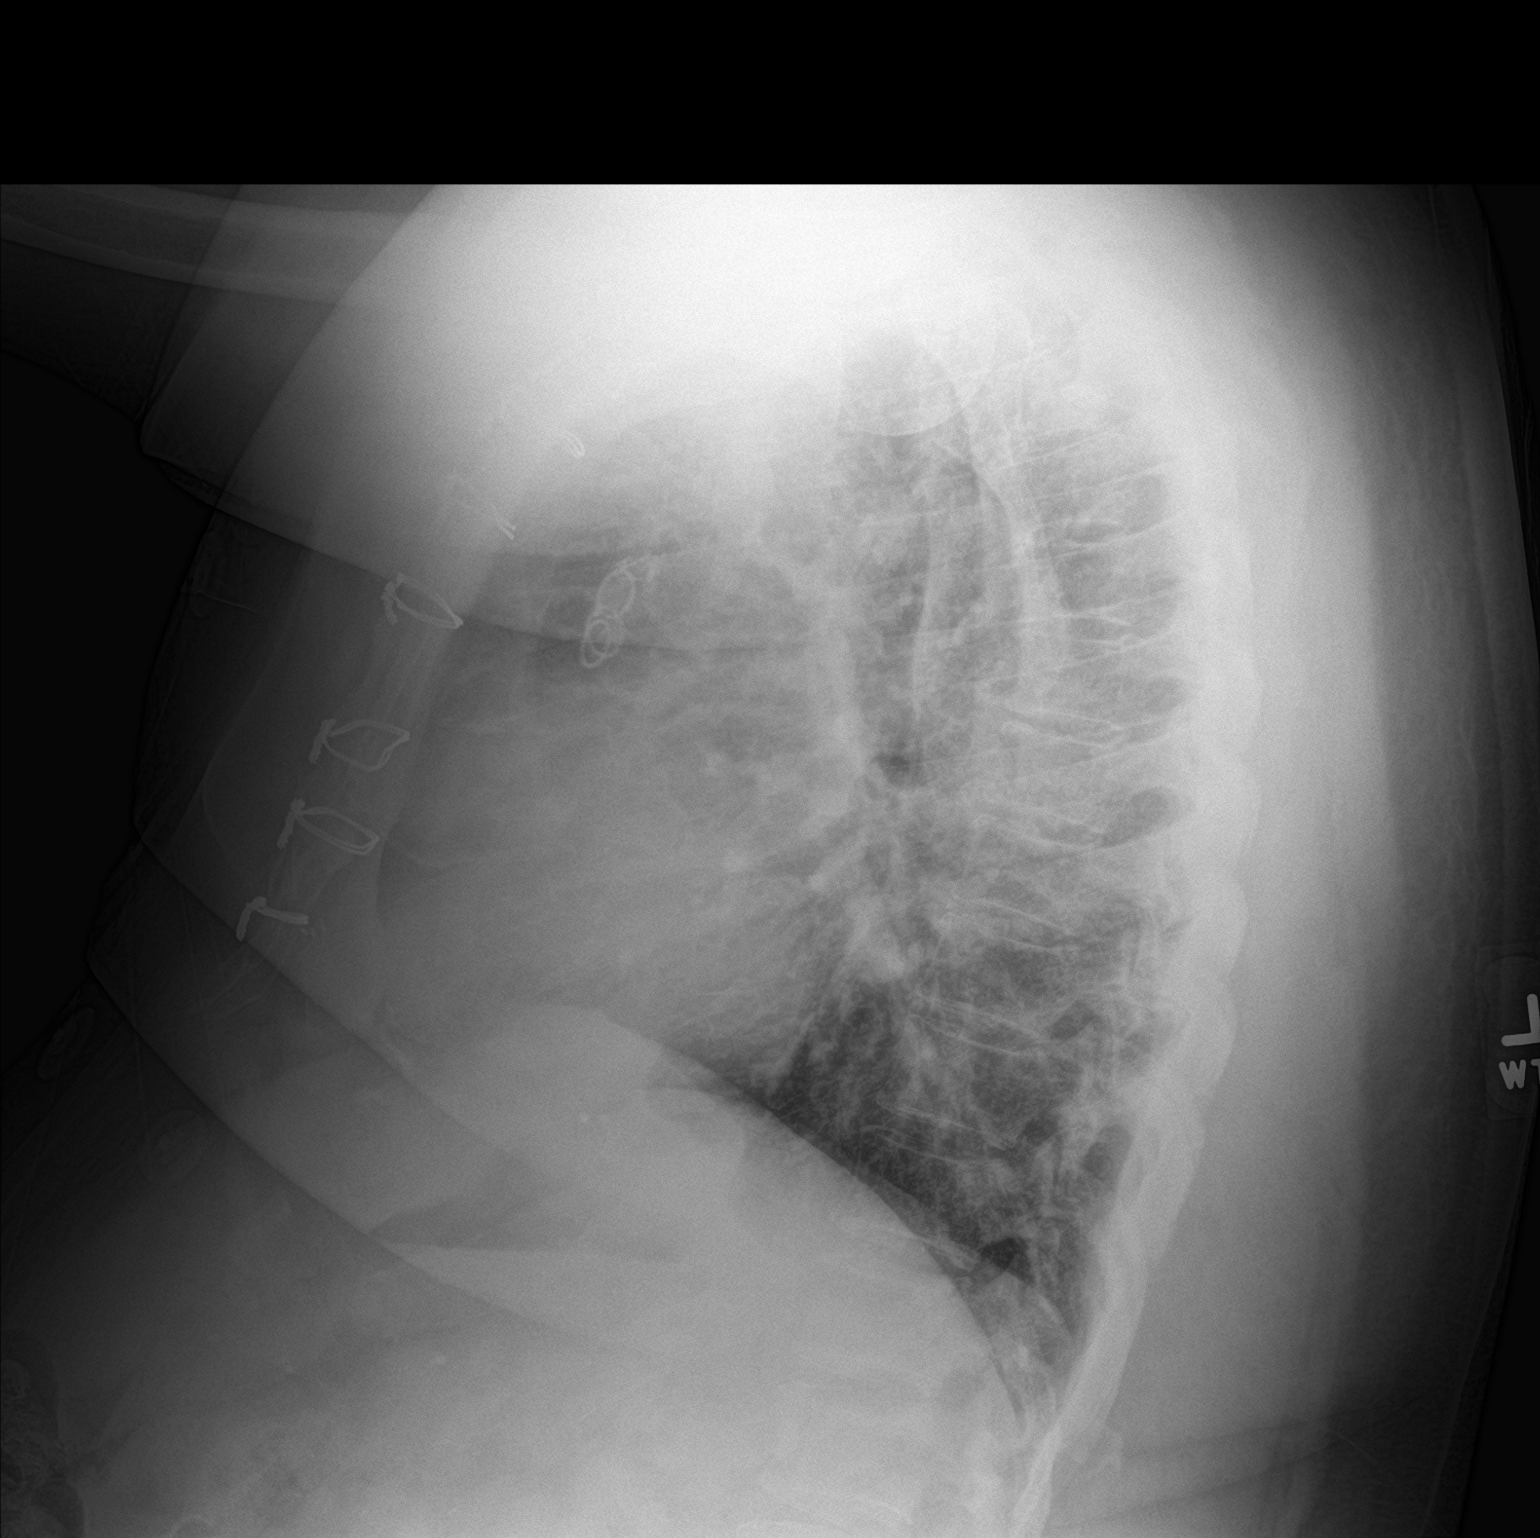

[2 of 2 positions shown; findings below may reference images not displayed]

FINDINGS: Postsurgical changes of CABG.

Cardiomediastinal silhouette is normal. Mediastinal contours appear
intact.

There is no evidence of focal airspace consolidation, pleural
effusion or pneumothorax. Chronic interstitial lung changes.

Osseous structures are without acute abnormality. Soft tissues are
grossly normal.
IMPRESSION: 1. No active cardiopulmonary disease.
2. Chronic interstitial lung changes.

## 2021-09-30 MED ORDER — BENZONATATE 200 MG PO CAPS: 200.0000 mg | ORAL_CAPSULE | Freq: Three times a day (TID) | ORAL | 0 refills | Status: DC | PRN

## 2021-09-30 MED ORDER — TRELEGY ELLIPTA 100-62.5-25 MCG/ACT IN AEPB: 1.0000 | INHALATION_SPRAY | Freq: Every day | RESPIRATORY_TRACT | 3 refills | Status: DC

## 2021-09-30 NOTE — Telephone Encounter (Signed)
Spoke with the pt  Trelegy refilled- 90 day supply per pt request   He states he has been bothered by dry cough, esp at night the past few days  He wakes up and coughs for about 15 min- not producing any sputum  He has tried Delsym otc without relief  He is asking if there is something that we could prescribe for cough  He denies any wheezing or increased SOB, fevers, aches  Please advise, thanks!  Allergies  Allergen Reactions   Empagliflozin Rash    Patient reports yeast.    Ibuprofen Other (See Comments)    Stomach pain

## 2021-09-30 NOTE — Telephone Encounter (Signed)
Tessalon perles 200 mg, # 40, 1 every 8 hours if needed Try elevate head a few inches when sleeping.

## 2021-09-30 NOTE — Telephone Encounter (Signed)
Called and spoke with pt letting him know recs per CY and he verbalized understanding. Rx for tessalon sent to pharmacy for pt. Nothing further needed.

## 2021-10-01 DIAGNOSIS — R296 Repeated falls: Secondary | ICD-10-CM | POA: Diagnosis not present

## 2021-10-01 DIAGNOSIS — M545 Low back pain, unspecified: Secondary | ICD-10-CM | POA: Diagnosis not present

## 2021-10-01 DIAGNOSIS — R269 Unspecified abnormalities of gait and mobility: Secondary | ICD-10-CM | POA: Diagnosis not present

## 2021-10-01 DIAGNOSIS — M6281 Muscle weakness (generalized): Secondary | ICD-10-CM | POA: Diagnosis not present

## 2021-10-03 ENCOUNTER — Telehealth: Payer: Self-pay | Admitting: Internal Medicine

## 2021-10-03 NOTE — Telephone Encounter (Signed)
Best options are going to be Robitussin DM, Delsym, Mucinex DM May people also find that Zero Sugar [employer] hard candies ( in bags from [employer], [employer]) can work quite well to keep cough down.

## 2021-10-03 NOTE — Telephone Encounter (Signed)
Dr. Annamaria Boots, please advise on an alternative to the benzonatate for pt.  Allergies  Allergen Reactions   Empagliflozin Rash    Patient reports yeast.    Ibuprofen Other (See Comments)    Stomach pain     Current Outpatient Medications:    aspirin EC 81 MG tablet, Take 81 mg by mouth daily., Disp: , Rfl:    azithromycin (ZITHROMAX) 250 MG tablet, Take 2 tablets on first day, then 1 tablet daily until finished., Disp: 6 tablet, Rfl: 0   B-D ULTRAFINE III SHORT PEN 31G X 8 MM MISC, , Disp: , Rfl: 1   benzonatate (TESSALON) 200 MG capsule, Take 1 capsule (200 mg total) by mouth every 8 (eight) hours as needed for cough., Disp: 40 capsule, Rfl: 0   chlorhexidine (HIBICLENS) 4 % external liquid, Apply topically daily as needed., Disp: 120 mL, Rfl: 0   collagenase (SANTYL) ointment, Apply 1 application topically daily. Right ankle measures 6.0 x 0.3cm., Disp: 30 g, Rfl: 5   diclofenac Sodium (VOLTAREN) 1 % GEL, APPLY 2 GRAMS TOPICALLY 4 TIMES DAILY TO KNEE, Disp: 100 g, Rfl: 2   eplerenone (INSPRA) 25 MG tablet, TAKE 1 TABLET(25 MG) BY MOUTH DAILY, Disp: 90 tablet, Rfl: 2   famotidine (PEPCID) 20 MG tablet, TAKE 1 TABLET(20 MG) BY MOUTH DAILY, Disp: 90 tablet, Rfl: 2   fluticasone (FLONASE) 50 MCG/ACT nasal spray, Place 1 spray into both nostrils daily., Disp: 16 g, Rfl: 12   Fluticasone-Umeclidin-Vilant (TRELEGY ELLIPTA) 100-62.5-25 MCG/ACT AEPB, Inhale 1 puff into the lungs daily., Disp: 180 each, Rfl: 3   glucose blood (ONETOUCH ULTRA) test strip, Use as instructed, Disp: 800 strip, Rfl: 0   HUMALOG 100 UNIT/ML injection, Inject into the skin., Disp: , Rfl:    HUMALOG KWIKPEN 200 UNIT/ML KwikPen, Inject into the skin., Disp: , Rfl:    HUMULIN R U-500 KWIKPEN 500 UNIT/ML kwikpen, Inject 50-60 Units into the skin See admin instructions. 60 units in the AM, 50 units at lunch and 50 in the evening, Disp: 3 mL, Rfl: 0   ipratropium-albuterol (DUONEB) 0.5-2.5 (3) MG/3ML SOLN, 1 vial in nebulizer up to  4 times daily, Disp: 90 mL, Rfl: 12   Lancets (ACCU-CHEK MULTICLIX) lancets, , Disp: , Rfl:    LANTUS SOLOSTAR 100 UNIT/ML Solostar Pen, Inject 55 Units into the skin every morning., Disp: , Rfl:    lidocaine (LIDODERM) 5 %, Place 1 patch onto the skin daily. Remove and Discard patch within 12 hours or as directed by MD, Disp: 15 patch, Rfl: 0   losartan (COZAAR) 50 MG tablet, Take 0.5 tablets (25 mg total) by mouth daily., Disp: 45 tablet, Rfl: 3   MAGNESIUM-OXIDE 400 (241.3 Mg) MG tablet, Take 1 tablet by mouth 2 (two) times daily., Disp: , Rfl:    metoprolol tartrate (LOPRESSOR) 50 MG tablet, TAKE 1 TABLET(50 MG) BY MOUTH TWICE DAILY, Disp: 180 tablet, Rfl: 2   nitroGLYCERIN (NITROSTAT) 0.4 MG SL tablet, Place 1 tablet (0.4 mg total) under the tongue every 5 (five) minutes as needed for chest pain., Disp: 25 tablet, Rfl: 3   nystatin ointment (MYCOSTATIN), Apply 1 application topically 2 (two) times daily., Disp: 30 g, Rfl: 0   ofloxacin (OCUFLOX) 0.3 % ophthalmic solution, Place 1 drop into the right eye 4 (four) times daily., Disp: , Rfl:    polyethylene glycol (MIRALAX / GLYCOLAX) 17 g packet, Take 17 g by mouth daily., Disp: 14 each, Rfl: 0   potassium chloride SA (KLOR-CON)  20 MEQ tablet, TAKE 2 TABLETS(40 MEQ) BY MOUTH THREE TIMES DAILY, Disp: 540 tablet, Rfl: 0   prednisoLONE acetate (PRED FORTE) 1 % ophthalmic suspension, Place 1 drop into the right eye 4 (four) times daily., Disp: , Rfl:    pregabalin (LYRICA) 50 MG capsule, Take 1 capsule (50 mg total) by mouth at bedtime as needed (nerve pain)., Disp: 30 capsule, Rfl: 0   PROAIR HFA 108 (90 Base) MCG/ACT inhaler, Inhale 2 puffs into the lungs every 6 (six) hours as needed for wheezing or shortness of breath., Disp: 8.5 g, Rfl: 5   rosuvastatin (CRESTOR) 20 MG tablet, Take 1 tablet (20 mg total) by mouth every evening., Disp: 30 tablet, Rfl: 11   silver sulfADIAZINE (SSD) 1 % cream, APPLY PEA SIZED AMOUNT TOPICALLY TO WOUND DAILY, Disp:  50 g, Rfl: 0   torsemide (DEMADEX) 20 MG tablet, TAKE 1 TABLET(20 MG) BY MOUTH TWICE DAILY, Disp: 180 tablet, Rfl: 2

## 2021-10-03 NOTE — Telephone Encounter (Signed)
Called and spoke with pt letting him know info per CY and he verbalized understanding.nothing further needed.

## 2021-10-07 DIAGNOSIS — E119 Type 2 diabetes mellitus without complications: Secondary | ICD-10-CM | POA: Diagnosis not present

## 2021-10-07 DIAGNOSIS — H25813 Combined forms of age-related cataract, bilateral: Secondary | ICD-10-CM | POA: Diagnosis not present

## 2021-10-09 ENCOUNTER — Telehealth: Payer: Self-pay | Admitting: Internal Medicine

## 2021-10-09 ENCOUNTER — Ambulatory Visit (INDEPENDENT_AMBULATORY_CARE_PROVIDER_SITE_OTHER): Payer: Medicare Other | Admitting: Sports Medicine

## 2021-10-09 ENCOUNTER — Ambulatory Visit (INDEPENDENT_AMBULATORY_CARE_PROVIDER_SITE_OTHER): Payer: Medicare Other

## 2021-10-09 VITALS — BP 138/70 | HR 75 | Ht 71.0 in | Wt 301.0 lb

## 2021-10-09 DIAGNOSIS — M25572 Pain in left ankle and joints of left foot: Secondary | ICD-10-CM

## 2021-10-09 DIAGNOSIS — R600 Localized edema: Secondary | ICD-10-CM | POA: Diagnosis not present

## 2021-10-09 MED ORDER — MELOXICAM 15 MG PO TABS: 15.0000 mg | ORAL_TABLET | Freq: Every day | ORAL | 0 refills | Status: DC

## 2021-10-09 NOTE — Telephone Encounter (Signed)
I was paged that the patient has questions about meloxicam and this is a second time him trying to reach [place].  No crepitus noted.  Called back at the provided #[phone] no response. Unable to leave voicemail. I will sign out to the day team

## 2021-10-09 NOTE — Patient Instructions (Addendum)
Good to see you  Boot at all time when walking for 2 weeks - Start meloxicam 15 mg daily x2 weeks.  If still having pain after 2 weeks, complete 3rd-week of meloxicam. May use remaining meloxicam as needed once daily for pain control.  Do not to use additional NSAIDs while taking meloxicam.  May use Tylenol [phone] mg 2 to 3 times a day for breakthrough pain. 2-3 week follow up

## 2021-10-09 NOTE — Progress Notes (Signed)
[name] [name] D.[facility] [place] Phone: [phone]   Assessment and Plan:     1. Left ankle pain, unspecified chronicity -Acute, uncomplicated, initial sports medicine visit - Patient experiencing 1 week of anterior ankle pain after falling and having his ankle caught under a cabinet.  Concern for acute process based on density seen over talus that was not present on foot x-ray 2 years prior, so we will treat as if this is a new fracture - Patient provided with walking boot in today's clinic.  Use at all times when ambulatory for the next 2 weeks - Start meloxicam 15 mg daily x2 weeks.  If still having pain after 2 weeks, complete 3rd-week of meloxicam. May use remaining meloxicam as needed once daily for pain control.  Do not to use additional NSAIDs while taking meloxicam.  May use Tylenol [phone] mg 2 to 3 times a day for breakthrough pain.  Patient has not tolerated ibuprofen in the past due to reflux, so advised patient to take meloxicam with food and with his famotidine medication. - With patient's past medical history, I do not recommend use of NSAIDs for prolonged periods of time, or courses of prednisone which could affect his blood glucose, heart rate, blood pressure - X-ray obtained in clinic.  My interpretation: New cortical density seen anterior to talus that corresponds with TTP though patient's sensation is overall limited due to diabetic neuropathy.  Density not seen on imaging from 2 years prior - DG Ankle Complete Left; Future     Pertinent previous records reviewed include left foot x-ray 10/11/2019   Follow Up: 2 to 3 weeks for reevaluation.  Would take patient out of walking boot.  If patient has significant improvement and NTTP, then density seen on imaging is likely chronic and we would start HEP.  If pain remains, would consider additional 2 to 4 weeks of boot use   Subjective:   I, [name] [name],  am serving as a Education administrator for Doctor Glennon Mac  Chief Complaint: left ankle  pain and swelling  HPI:   10/09/21 Patient is a 66 year old male complaining of left ankle pain and swelling. Patient states that he fell last week slipped , his foot got caught under the cabinet the swelling is under the ankle and the toe, hurts when he tries to walk. Has been taking tylenol and that does not seem to be helping at all, no locking clicking or popping, can move his toes as  well has an old fracture on his  ankle from along time ago, whole ankle joint feels stiff and throbbing pain   Relevant Historical Information: DM type II, hypertension, elevated BMI, PVD  Additional pertinent review of systems negative.   Current Outpatient Medications:    aspirin EC 81 MG tablet, Take 81 mg by mouth daily., Disp: , Rfl:    azithromycin (ZITHROMAX) 250 MG tablet, Take 2 tablets on first day, then 1 tablet daily until finished., Disp: 6 tablet, Rfl: 0   B-D ULTRAFINE III SHORT PEN 31G X 8 MM MISC, , Disp: , Rfl: 1   benzonatate (TESSALON) 200 MG capsule, Take 1 capsule (200 mg total) by mouth every 8 (eight) hours as needed for cough., Disp: 40 capsule, Rfl: 0   chlorhexidine (HIBICLENS) 4 % external liquid, Apply topically daily as needed., Disp: 120 mL, Rfl: 0   collagenase (SANTYL) ointment, Apply 1 application topically daily. Right ankle measures  6.0 x 0.3cm., Disp: 30 g, Rfl: 5   diclofenac Sodium (VOLTAREN) 1 % GEL, APPLY 2 GRAMS TOPICALLY 4 TIMES DAILY TO KNEE, Disp: 100 g, Rfl: 2   eplerenone (INSPRA) 25 MG tablet, TAKE 1 TABLET(25 MG) BY MOUTH DAILY, Disp: 90 tablet, Rfl: 2   famotidine (PEPCID) 20 MG tablet, TAKE 1 TABLET(20 MG) BY MOUTH DAILY, Disp: 90 tablet, Rfl: 2   fluticasone (FLONASE) 50 MCG/ACT nasal spray, Place 1 spray into both nostrils daily., Disp: 16 g, Rfl: 12   Fluticasone-Umeclidin-Vilant (TRELEGY ELLIPTA) 100-62.5-25 MCG/ACT AEPB, Inhale 1 puff into the lungs daily., Disp: 180  each, Rfl: 3   glucose blood (ONETOUCH ULTRA) test strip, Use as instructed, Disp: 800 strip, Rfl: 0   HUMALOG 100 UNIT/ML injection, Inject into the skin., Disp: , Rfl:    HUMALOG KWIKPEN 200 UNIT/ML KwikPen, Inject into the skin., Disp: , Rfl:    HUMULIN R U-500 KWIKPEN 500 UNIT/ML kwikpen, Inject 50-60 Units into the skin See admin instructions. 60 units in the AM, 50 units at lunch and 50 in the evening, Disp: 3 mL, Rfl: 0   ipratropium-albuterol (DUONEB) 0.5-2.5 (3) MG/3ML SOLN, 1 vial in nebulizer up to 4 times daily, Disp: 90 mL, Rfl: 12   Lancets (ACCU-CHEK MULTICLIX) lancets, , Disp: , Rfl:    LANTUS SOLOSTAR 100 UNIT/ML Solostar Pen, Inject 55 Units into the skin every morning., Disp: , Rfl:    lidocaine (LIDODERM) 5 %, Place 1 patch onto the skin daily. Remove and Discard patch within 12 hours or as directed by MD, Disp: 15 patch, Rfl: 0   losartan (COZAAR) 50 MG tablet, Take 0.5 tablets (25 mg total) by mouth daily., Disp: 45 tablet, Rfl: 3   MAGNESIUM-OXIDE 400 (241.3 Mg) MG tablet, Take 1 tablet by mouth 2 (two) times daily., Disp: , Rfl:    meloxicam (MOBIC) 15 MG tablet, Take 1 tablet (15 mg total) by mouth daily., Disp: 30 tablet, Rfl: 0   metoprolol tartrate (LOPRESSOR) 50 MG tablet, TAKE 1 TABLET(50 MG) BY MOUTH TWICE DAILY, Disp: 180 tablet, Rfl: 2   nitroGLYCERIN (NITROSTAT) 0.4 MG SL tablet, Place 1 tablet (0.4 mg total) under the tongue every 5 (five) minutes as needed for chest pain., Disp: 25 tablet, Rfl: 3   nystatin ointment (MYCOSTATIN), Apply 1 application topically 2 (two) times daily., Disp: 30 g, Rfl: 0   ofloxacin (OCUFLOX) 0.3 % ophthalmic solution, Place 1 drop into the right eye 4 (four) times daily., Disp: , Rfl:    polyethylene glycol (MIRALAX / GLYCOLAX) 17 g packet, Take 17 g by mouth daily., Disp: 14 each, Rfl: 0   potassium chloride SA (KLOR-CON) 20 MEQ tablet, TAKE 2 TABLETS(40 MEQ) BY MOUTH THREE TIMES DAILY, Disp: 540 tablet, Rfl: 0   prednisoLONE  acetate (PRED FORTE) 1 % ophthalmic suspension, Place 1 drop into the right eye 4 (four) times daily., Disp: , Rfl:    pregabalin (LYRICA) 50 MG capsule, Take 1 capsule (50 mg total) by mouth at bedtime as needed (nerve pain)., Disp: 30 capsule, Rfl: 0   PROAIR HFA 108 (90 Base) MCG/ACT inhaler, Inhale 2 puffs into the lungs every 6 (six) hours as needed for wheezing or shortness of breath., Disp: 8.5 g, Rfl: 5   rosuvastatin (CRESTOR) 20 MG tablet, Take 1 tablet (20 mg total) by mouth every evening., Disp: 30 tablet, Rfl: 11   silver sulfADIAZINE (SSD) 1 % cream, APPLY PEA SIZED AMOUNT TOPICALLY TO WOUND DAILY, Disp: 50 g, Rfl:  0   torsemide (DEMADEX) 20 MG tablet, TAKE 1 TABLET(20 MG) BY MOUTH TWICE DAILY, Disp: 180 tablet, Rfl: 2   Objective:     Vitals:   10/09/21 1017  BP: 138/70  Pulse: 75  SpO2: 91%  Weight: (!) 301 lb (136.5 kg)  Height: '5\' 11"'$  (1.803 m)      Body mass index is 41.98 kg/m.    Physical Exam:    Gen: Appears well, nad, nontoxic and pleasant Psych: Alert and oriented, appropriate mood and affect Neuro: sensation intact, strength is 5/5 with df/pf/inv/ev, muscle tone wnl Skin: no susupicious lesions or rashes.  Generalized bilateral lower extremity swelling with peripheral vascular disease changes  Left ankle: no deformity, generalized swelling and effusion effusion, mildly increased compared to right TTP navicular, anterior ankle joint space NTTP over fibular head, lat mal, medial mal, achilles,  , base of 5th, ATFL, CFL, deltoid, calcaneous or midfoot ROM DF 20, PF 35, inv/ev intact Negative ant drawer, talar tilt, rotation test, squeeze test. Neg thompson No pain with resisted inversion or eversion    Electronically signed by:  [name] [name] D.[facility] 10:52 AM 10/09/21

## 2021-10-09 NOTE — Telephone Encounter (Signed)
Pt called back. Patient is concerned about meloxicam prescription in the setting of his known cardiovascular disease. We discussed at length possible adverse effects of NSAIDs including high risk of myocardial infarction & kidney injury. Since patient is in pain advised to take 1 dose of Tylenol and 1 dose of meloxicam tonight and then reach out to sports medicine Dr. Marland Kitchen who usually prescribe medication.

## 2021-10-12 ENCOUNTER — Other Ambulatory Visit: Payer: Self-pay | Admitting: Physician Assistant

## 2021-10-12 DIAGNOSIS — I10 Essential (primary) hypertension: Secondary | ICD-10-CM

## 2021-10-21 ENCOUNTER — Other Ambulatory Visit: Payer: Self-pay

## 2021-10-21 MED ORDER — EPLERENONE 25 MG PO TABS: ORAL_TABLET | ORAL | 1 refills | Status: DC

## 2021-10-21 NOTE — Telephone Encounter (Signed)
Pt's medication was sent to pt's pharmacy as requested. Confirmation received.  °

## 2021-10-22 NOTE — Progress Notes (Deleted)
Benito Mccreedy D.[facility] [place] Phone: [phone]   Assessment and Plan:     There are no diagnoses linked to this encounter.  ***   Pertinent previous records reviewed include ***   Follow Up: ***     Subjective:   I, Shae Hinnenkamp, am serving as a Education administrator for Doctor Glennon Mac   Chief Complaint: left ankle  pain and swelling   HPI:    10/09/21 Patient is a 66 year old male complaining of left ankle pain and swelling. Patient states that he fell last week slipped , his foot got caught under the cabinet the swelling is under the ankle and the toe, hurts when he tries to walk. Has been taking tylenol and that does not seem to be helping at all, no locking clicking or popping, can move his toes as  well has an old fracture on his  ankle from along time ago, whole ankle joint feels stiff and throbbing pain    10/23/2021 Patient states   Relevant Historical Information: DM type II, hypertension, elevated BMI, PVD  Additional pertinent review of systems negative.   Current Outpatient Medications:    aspirin EC 81 MG tablet, Take 81 mg by mouth daily., Disp: , Rfl:    azithromycin (ZITHROMAX) 250 MG tablet, Take 2 tablets on first day, then 1 tablet daily until finished., Disp: 6 tablet, Rfl: 0   B-D ULTRAFINE III SHORT PEN 31G X 8 MM MISC, , Disp: , Rfl: 1   benzonatate (TESSALON) 200 MG capsule, Take 1 capsule (200 mg total) by mouth every 8 (eight) hours as needed for cough., Disp: 40 capsule, Rfl: 0   chlorhexidine (HIBICLENS) 4 % external liquid, Apply topically daily as needed., Disp: 120 mL, Rfl: 0   collagenase (SANTYL) ointment, Apply 1 application topically daily. Right ankle measures 6.0 x 0.3cm., Disp: 30 g, Rfl: 5   diclofenac Sodium (VOLTAREN) 1 % GEL, APPLY 2 GRAMS TOPICALLY 4 TIMES DAILY TO KNEE, Disp: 100 g, Rfl: 2   eplerenone (INSPRA) 25 MG tablet, TAKE 1 TABLET(25 MG) BY MOUTH DAILY,  Disp: 90 tablet, Rfl: 1   famotidine (PEPCID) 20 MG tablet, TAKE 1 TABLET(20 MG) BY MOUTH DAILY, Disp: 90 tablet, Rfl: 2   fluticasone (FLONASE) 50 MCG/ACT nasal spray, Place 1 spray into both nostrils daily., Disp: 16 g, Rfl: 12   Fluticasone-Umeclidin-Vilant (TRELEGY ELLIPTA) 100-62.5-25 MCG/ACT AEPB, Inhale 1 puff into the lungs daily., Disp: 180 each, Rfl: 3   glucose blood (ONETOUCH ULTRA) test strip, Use as instructed, Disp: 800 strip, Rfl: 0   HUMALOG 100 UNIT/ML injection, Inject into the skin., Disp: , Rfl:    HUMALOG KWIKPEN 200 UNIT/ML KwikPen, Inject into the skin., Disp: , Rfl:    HUMULIN R U-500 KWIKPEN 500 UNIT/ML kwikpen, Inject 50-60 Units into the skin See admin instructions. 60 units in the AM, 50 units at lunch and 50 in the evening, Disp: 3 mL, Rfl: 0   ipratropium-albuterol (DUONEB) 0.5-2.5 (3) MG/3ML SOLN, 1 vial in nebulizer up to 4 times daily, Disp: 90 mL, Rfl: 12   Lancets (ACCU-CHEK MULTICLIX) lancets, , Disp: , Rfl:    LANTUS SOLOSTAR 100 UNIT/ML Solostar Pen, Inject 55 Units into the skin every morning., Disp: , Rfl:    lidocaine (LIDODERM) 5 %, Place 1 patch onto the skin daily. Remove and Discard patch within 12 hours or as directed by MD, Disp: 15 patch, Rfl: 0  losartan (COZAAR) 50 MG tablet, TAKE 1/2 TABLET(25 MG) BY MOUTH DAILY, Disp: 45 tablet, Rfl: 1   MAGNESIUM-OXIDE 400 (241.3 Mg) MG tablet, Take 1 tablet by mouth 2 (two) times daily., Disp: , Rfl:    meloxicam (MOBIC) 15 MG tablet, Take 1 tablet (15 mg total) by mouth daily., Disp: 30 tablet, Rfl: 0   metoprolol tartrate (LOPRESSOR) 50 MG tablet, TAKE 1 TABLET(50 MG) BY MOUTH TWICE DAILY, Disp: 180 tablet, Rfl: 2   nitroGLYCERIN (NITROSTAT) 0.4 MG SL tablet, Place 1 tablet (0.4 mg total) under the tongue every 5 (five) minutes as needed for chest pain., Disp: 25 tablet, Rfl: 3   nystatin ointment (MYCOSTATIN), Apply 1 application topically 2 (two) times daily., Disp: 30 g, Rfl: 0   ofloxacin (OCUFLOX) 0.3 %  ophthalmic solution, Place 1 drop into the right eye 4 (four) times daily., Disp: , Rfl:    polyethylene glycol (MIRALAX / GLYCOLAX) 17 g packet, Take 17 g by mouth daily., Disp: 14 each, Rfl: 0   potassium chloride SA (KLOR-CON) 20 MEQ tablet, TAKE 2 TABLETS(40 MEQ) BY MOUTH THREE TIMES DAILY, Disp: 540 tablet, Rfl: 0   prednisoLONE acetate (PRED FORTE) 1 % ophthalmic suspension, Place 1 drop into the right eye 4 (four) times daily., Disp: , Rfl:    pregabalin (LYRICA) 50 MG capsule, Take 1 capsule (50 mg total) by mouth at bedtime as needed (nerve pain)., Disp: 30 capsule, Rfl: 0   PROAIR HFA 108 (90 Base) MCG/ACT inhaler, Inhale 2 puffs into the lungs every 6 (six) hours as needed for wheezing or shortness of breath., Disp: 8.5 g, Rfl: 5   rosuvastatin (CRESTOR) 20 MG tablet, Take 1 tablet (20 mg total) by mouth every evening., Disp: 30 tablet, Rfl: 11   silver sulfADIAZINE (SSD) 1 % cream, APPLY PEA SIZED AMOUNT TOPICALLY TO WOUND DAILY, Disp: 50 g, Rfl: 0   torsemide (DEMADEX) 20 MG tablet, TAKE 1 TABLET(20 MG) BY MOUTH TWICE DAILY, Disp: 180 tablet, Rfl: 2   Objective:     There were no vitals filed for this visit.    There is no height or weight on file to calculate BMI.    Physical Exam:    ***   Electronically signed by:  Benito Mccreedy D.[facility] 8:00 AM 10/22/21

## 2021-10-23 ENCOUNTER — Ambulatory Visit: Payer: Medicare Other | Admitting: Sports Medicine

## 2021-10-24 NOTE — Progress Notes (Addendum)
[name] [name] [name] [name] Phone: [phone]   Assessment and Plan:     1. Acute left ankle pain -Acute, no improvement, subsequent visit - Patient still experiencing anterior ankle pain localized to area of density superficial to talus seen on x-ray on previous office visit - We will treat this as if it is an acute avulsion fracture based on patient's symptoms.  Recommend continued boot use for an additional 2 weeks or until reevaluated - Patient did not take meloxicam due to comorbidities.  Can fully discontinue meloxicam at this time - Start Tylenol 650 mg 3 times a day.  May use an additional Tylenol 650 mg 1 tablet as needed for breakthrough pain - Start tramadol 50 mg every 8 hours as needed for severe breakthrough pain - traMADol (ULTRAM) 50 MG tablet; Take 1 tablet (50 mg total) by mouth every 8 (eight) hours as needed for up to 5 days.    Pertinent previous records reviewed include none   Follow Up: 2 to 3 weeks for reevaluation.  Would discontinue boot at that time if patient is seeing improvement and could start physical therapy   Subjective:   I, [name] [name], am serving as a Education administrator for [name] [name]   Chief Complaint: left ankle  pain and swelling   HPI:    10/09/21 Patient is a 66 year old male complaining of left ankle pain and swelling. Patient states that he fell last week slipped , his foot got caught under the cabinet the swelling is under the ankle and the toe, hurts when he tries to walk. Has been taking tylenol and that does not seem to be helping at all, no locking clicking or popping, can move his toes as  well has an old fracture on his  ankle from along time ago, whole ankle joint feels stiff and throbbing pain   10/29/2021 Patient states he is hurting wasn't able to take meloxicam do to his heart condition    Relevant Historical Information: DM type II, hypertension,  elevated BMI, PVD  Additional pertinent review of systems negative.   Current Outpatient Medications:    aspirin EC 81 MG tablet, Take 81 mg by mouth daily., Disp: , Rfl:    azithromycin (ZITHROMAX) 250 MG tablet, Take 2 tablets on first day, then 1 tablet daily until finished., Disp: 6 tablet, Rfl: 0   B-D ULTRAFINE III SHORT PEN 31G X 8 MM MISC, , Disp: , Rfl: 1   benzonatate (TESSALON) 200 MG capsule, Take 1 capsule (200 mg total) by mouth every 8 (eight) hours as needed for cough., Disp: 40 capsule, Rfl: 0   chlorhexidine (HIBICLENS) 4 % external liquid, Apply topically daily as needed., Disp: 120 mL, Rfl: 0   collagenase (SANTYL) ointment, Apply 1 application topically daily. Right ankle measures 6.0 x 0.3cm., Disp: 30 g, Rfl: 5   diclofenac Sodium (VOLTAREN) 1 % GEL, APPLY 2 GRAMS TOPICALLY 4 TIMES DAILY TO KNEE, Disp: 100 g, Rfl: 2   eplerenone (INSPRA) 25 MG tablet, TAKE 1 TABLET(25 MG) BY MOUTH DAILY, Disp: 90 tablet, Rfl: 1   famotidine (PEPCID) 20 MG tablet, TAKE 1 TABLET(20 MG) BY MOUTH DAILY, Disp: 90 tablet, Rfl: 2   fluticasone (FLONASE) 50 MCG/ACT nasal spray, Place 1 spray into both nostrils daily., Disp: 16 g, Rfl: 12   Fluticasone-Umeclidin-Vilant (TRELEGY ELLIPTA) 100-62.5-25 MCG/ACT AEPB, Inhale 1 puff into the lungs daily., Disp: 180 each, Rfl: 3  glucose blood (ONETOUCH ULTRA) test strip, Use as instructed, Disp: 800 strip, Rfl: 0   HUMALOG 100 UNIT/ML injection, Inject into the skin., Disp: , Rfl:    HUMALOG KWIKPEN 200 UNIT/ML KwikPen, Inject into the skin., Disp: , Rfl:    HUMULIN R U-500 KWIKPEN 500 UNIT/ML kwikpen, Inject 50-60 Units into the skin See admin instructions. 60 units in the AM, 50 units at lunch and 50 in the evening, Disp: 3 mL, Rfl: 0   ipratropium-albuterol (DUONEB) 0.5-2.5 (3) MG/3ML SOLN, 1 vial in nebulizer up to 4 times daily, Disp: 90 mL, Rfl: 12   Lancets (ACCU-CHEK MULTICLIX) lancets, , Disp: , Rfl:    LANTUS SOLOSTAR 100 UNIT/ML Solostar  Pen, Inject 55 Units into the skin every morning., Disp: , Rfl:    lidocaine (LIDODERM) 5 %, Place 1 patch onto the skin daily. Remove and Discard patch within 12 hours or as directed by MD, Disp: 15 patch, Rfl: 0   losartan (COZAAR) 50 MG tablet, TAKE 1/2 TABLET(25 MG) BY MOUTH DAILY, Disp: 45 tablet, Rfl: 1   MAGNESIUM-OXIDE 400 (241.3 Mg) MG tablet, Take 1 tablet by mouth 2 (two) times daily., Disp: , Rfl:    meloxicam (MOBIC) 15 MG tablet, Take 1 tablet (15 mg total) by mouth daily., Disp: 30 tablet, Rfl: 0   metoprolol tartrate (LOPRESSOR) 50 MG tablet, TAKE 1 TABLET(50 MG) BY MOUTH TWICE DAILY, Disp: 180 tablet, Rfl: 2   nitroGLYCERIN (NITROSTAT) 0.4 MG SL tablet, Place 1 tablet (0.4 mg total) under the tongue every 5 (five) minutes as needed for chest pain., Disp: 25 tablet, Rfl: 3   nystatin ointment (MYCOSTATIN), Apply 1 application topically 2 (two) times daily., Disp: 30 g, Rfl: 0   ofloxacin (OCUFLOX) 0.3 % ophthalmic solution, Place 1 drop into the right eye 4 (four) times daily., Disp: , Rfl:    polyethylene glycol (MIRALAX / GLYCOLAX) 17 g packet, Take 17 g by mouth daily., Disp: 14 each, Rfl: 0   potassium chloride SA (KLOR-CON) 20 MEQ tablet, TAKE 2 TABLETS(40 MEQ) BY MOUTH THREE TIMES DAILY, Disp: 540 tablet, Rfl: 0   prednisoLONE acetate (PRED FORTE) 1 % ophthalmic suspension, Place 1 drop into the right eye 4 (four) times daily., Disp: , Rfl:    pregabalin (LYRICA) 50 MG capsule, Take 1 capsule (50 mg total) by mouth at bedtime as needed (nerve pain)., Disp: 30 capsule, Rfl: 0   PROAIR HFA 108 (90 Base) MCG/ACT inhaler, Inhale 2 puffs into the lungs every 6 (six) hours as needed for wheezing or shortness of breath., Disp: 8.5 g, Rfl: 5   rosuvastatin (CRESTOR) 20 MG tablet, Take 1 tablet (20 mg total) by mouth every evening., Disp: 30 tablet, Rfl: 11   silver sulfADIAZINE (SSD) 1 % cream, APPLY PEA SIZED AMOUNT TOPICALLY TO WOUND DAILY, Disp: 50 g, Rfl: 0   torsemide (DEMADEX) 20  MG tablet, TAKE 1 TABLET(20 MG) BY MOUTH TWICE DAILY, Disp: 180 tablet, Rfl: 2   traMADol (ULTRAM) 50 MG tablet, Take 1 tablet (50 mg total) by mouth every 8 (eight) hours as needed for up to 5 days., Disp: 15 tablet, Rfl: 0   Objective:     Vitals:   10/29/21 1101  Pulse: 82  SpO2: 90%  Weight: (!) 301 lb (136.5 kg)  Height: '5\' 11"'$  (1.803 m)      Body mass index is 41.98 kg/m.    Physical Exam:     Gen: Appears well, nad, nontoxic and pleasant Psych: Alert  and oriented, appropriate mood and affect Neuro: sensation intact, strength is 5/5 with df/pf/inv/ev, muscle tone wnl Skin: no susupicious lesions or rashes.  Generalized bilateral lower extremity swelling with peripheral vascular disease changes   Left ankle: no deformity, generalized swelling and effusion effusion, mildly increased compared to right TTP navicular, anterior ankle joint space NTTP over fibular head, lat mal, medial mal, achilles,  , base of 5th, ATFL, CFL, deltoid, calcaneous or midfoot ROM DF 20, PF 35, inv/ev intact Negative ant drawer, talar tilt, rotation test, squeeze test. Neg thompson No pain with resisted inversion or eversion    Electronically signed by:  [name] [name] D.[facility] 11:12 AM 10/29/21

## 2021-10-29 ENCOUNTER — Ambulatory Visit (INDEPENDENT_AMBULATORY_CARE_PROVIDER_SITE_OTHER): Payer: Medicare Other | Admitting: Sports Medicine

## 2021-10-29 VITALS — HR 82 | Ht 71.0 in | Wt 301.0 lb

## 2021-10-29 DIAGNOSIS — M25572 Pain in left ankle and joints of left foot: Secondary | ICD-10-CM

## 2021-10-29 MED ORDER — TRAMADOL HCL 50 MG PO TABS: 50.0000 mg | ORAL_TABLET | Freq: Three times a day (TID) | ORAL | 0 refills | Status: AC | PRN

## 2021-10-29 NOTE — Patient Instructions (Addendum)
Good to see you  - Start Tylenol 650 mg 3 times a day.  May use an additional Tylenol 650 mg 1 tablet as needed for breakthrough pain - Start tramadol 50 mg every 8 hours as needed for severe breakthrough pain - Continue boot use with an additional 2 weeks - Follow-up in 2 to 3 weeks

## 2021-11-04 ENCOUNTER — Encounter: Payer: Self-pay | Admitting: *Deleted

## 2021-11-05 ENCOUNTER — Other Ambulatory Visit: Payer: Self-pay | Admitting: Sports Medicine

## 2021-11-06 ENCOUNTER — Other Ambulatory Visit: Payer: Self-pay | Admitting: Physician Assistant

## 2021-11-11 ENCOUNTER — Other Ambulatory Visit: Payer: Self-pay | Admitting: Cardiology

## 2021-11-11 ENCOUNTER — Telehealth: Payer: Self-pay | Admitting: Pharmacist

## 2021-11-11 NOTE — Progress Notes (Signed)
Chronic Care Management Pharmacy Assistant   Name: [name] [name]  MRN: [id number] DOB: May 07, 1955  Reason for Encounter: Diabetes Adherence Call    Recent office visits:  None  Recent consult visits:  10/29/2021 [facility] (Sports Medicine) Glennon Mac, DO; - Patient did not take meloxicam due to comorbidities.  Cannot fully discontinue meloxicam at this time - Start Tylenol 650 mg 3 times a day.  May use an additional Tylenol 650 mg 1 tablet as needed for breakthrough pain - Start tramadol 50 mg every 8 hours as needed for severe breakthrough pain  10/09/2021 [facility] (Sports Medicine) Glennon Mac, DO; - Start meloxicam 15 mg daily x2 weeks.  If still having pain after 2 weeks, complete 3rd-week of meloxicam. May use remaining meloxicam as needed once daily for pain control.  Do not to use additional NSAIDs while taking meloxicam.  May use Tylenol [phone] mg 2 to 3 times a day for breakthrough pain.  08/27/2021 [facility] (Pulmonology) Clayton Bibles, NP;  Restart your CPAP nightly, minimum of 4-6 hours  Hospital visits:  None in previous 6 months  Medications: Outpatient Encounter Medications as of 11/11/2021  Medication Sig   aspirin EC 81 MG tablet Take 81 mg by mouth daily.   azithromycin (ZITHROMAX) 250 MG tablet Take 2 tablets on first day, then 1 tablet daily until finished.   B-D ULTRAFINE III SHORT PEN 31G X 8 MM MISC    benzonatate (TESSALON) 200 MG capsule Take 1 capsule (200 mg total) by mouth every 8 (eight) hours as needed for cough.   chlorhexidine (HIBICLENS) 4 % external liquid Apply topically daily as needed.   collagenase (SANTYL) ointment Apply 1 application topically daily. Right ankle measures 6.0 x 0.3cm.   diclofenac Sodium (VOLTAREN) 1 % GEL APPLY 2 GRAMS TOPICALLY 4 TIMES DAILY TO KNEE   eplerenone (INSPRA) 25 MG tablet TAKE 1 TABLET(25 MG) BY MOUTH DAILY   famotidine (PEPCID) 20 MG tablet TAKE 1 TABLET(20 MG) BY MOUTH DAILY   fluticasone (FLONASE) 50  MCG/ACT nasal spray Place 1 spray into both nostrils daily.   Fluticasone-Umeclidin-Vilant (TRELEGY ELLIPTA) 100-62.5-25 MCG/ACT AEPB Inhale 1 puff into the lungs daily.   glucose blood (ONETOUCH ULTRA) test strip TEST AS DIRECTED 8 TIMES PER DAY   HUMALOG 100 UNIT/ML injection Inject into the skin.   HUMALOG KWIKPEN 200 UNIT/ML KwikPen Inject into the skin.   HUMULIN R U-500 KWIKPEN 500 UNIT/ML kwikpen Inject 50-60 Units into the skin See admin instructions. 60 units in the AM, 50 units at lunch and 50 in the evening   ipratropium-albuterol (DUONEB) 0.5-2.5 (3) MG/3ML SOLN 1 vial in nebulizer up to 4 times daily   Lancets (ACCU-CHEK MULTICLIX) lancets    LANTUS SOLOSTAR 100 UNIT/ML Solostar Pen Inject 55 Units into the skin every morning.   lidocaine (LIDODERM) 5 % Place 1 patch onto the skin daily. Remove and Discard patch within 12 hours or as directed by MD   losartan (COZAAR) 50 MG tablet TAKE 1/2 TABLET(25 MG) BY MOUTH DAILY   MAGNESIUM-OXIDE 400 (241.3 Mg) MG tablet Take 1 tablet by mouth 2 (two) times daily.   meloxicam (MOBIC) 15 MG tablet Take 1 tablet (15 mg total) by mouth daily.   metoprolol tartrate (LOPRESSOR) 50 MG tablet TAKE 1 TABLET(50 MG) BY MOUTH TWICE DAILY   nitroGLYCERIN (NITROSTAT) 0.4 MG SL tablet Place 1 tablet (0.4 mg total) under the tongue every 5 (five) minutes as needed for chest pain.   nystatin ointment (  MYCOSTATIN) Apply 1 application topically 2 (two) times daily.   ofloxacin (OCUFLOX) 0.3 % ophthalmic solution Place 1 drop into the right eye 4 (four) times daily.   polyethylene glycol (MIRALAX / GLYCOLAX) 17 g packet Take 17 g by mouth daily.   potassium chloride SA (KLOR-CON) 20 MEQ tablet TAKE 2 TABLETS(40 MEQ) BY MOUTH THREE TIMES DAILY   prednisoLONE acetate (PRED FORTE) 1 % ophthalmic suspension Place 1 drop into the right eye 4 (four) times daily.   pregabalin (LYRICA) 50 MG capsule Take 1 capsule (50 mg total) by mouth at bedtime as needed (nerve pain).    PROAIR HFA 108 (90 Base) MCG/ACT inhaler Inhale 2 puffs into the lungs every 6 (six) hours as needed for wheezing or shortness of breath.   rosuvastatin (CRESTOR) 20 MG tablet Take 1 tablet (20 mg total) by mouth every evening.   silver sulfADIAZINE (SSD) 1 % cream APPLY PEA SIZED AMOUNT TOPICALLY TO WOUND DAILY   torsemide (DEMADEX) 20 MG tablet TAKE 1 TABLET(20 MG) BY MOUTH TWICE DAILY   No facility-administered encounter medications on file as of 11/11/2021.   Recent Relevant Labs: Lab Results  Component Value Date/Time   HGBA1C 11.0 (H) 05/09/2021 09:57 AM   HGBA1C 10.6 02/02/2020 12:00 AM   HGBA1C 10.8 10/12/2019 12:00 AM   HGBA1C 10.8 10/12/2019 12:00 AM   MICROALBUR 22.48 03/24/2018 12:00 AM    Kidney Function Lab Results  Component Value Date/Time   CREATININE 1.47 07/30/2021 11:38 AM   CREATININE 1.48 05/27/2021 10:25 AM   CREATININE 1.01 03/20/2017 03:14 PM   CREATININE 1.23 03/24/2016 03:19 PM   GFR 49.04 (L) 07/30/2021 11:38 AM   GFRNONAA >60 09/02/2020 03:09 AM   GFRAA 65 12/12/2019 12:12 PM    Current antihyperglycemic regimen:   What recent interventions/DTPs have been made to improve glycemic control:   Have there been any recent hospitalizations or ED visits since last visit with [facility]?  Patient  hypoglycemic symptoms, including  Patient  hyperglycemic symptoms, including  How often are you checking your blood sugar?  What are your blood sugars ranging?  Fasting:  Before meals:  After meals:  Bedtime:  During the week, how often does your blood glucose drop below 70?  Are you checking your feet daily/regularly?   Adherence Review: Is the patient currently on a STATIN medication?  Is the patient currently on ACE/ARB medication?  Does the patient have >5 day gap between last estimated fill dates?   **Unsuccessful attempt at reaching patient to complete this call.**  Care Gaps: Medicare Annual Wellness: Completed 11/15/2020 Ophthalmology Exam:  Overdue since 03/13/2021 Foot Exam: Due since 11/02/2021 Hemoglobin A1C: 11.0% on 05/09/2021 Colonoscopy: Overdue - never done  Future Appointments  Date Time Provider [place]  11/12/2021 11:15 AM Glennon Mac, DO [facility] None  11/28/2021  9:30 AM [facility] HEALTH COACH [facility]  12/02/2021 11:00 AM Baird Lyons D, MD [facility] None   Star Rating Drugs: Lantus last filled 09/23/2021 90 DS Losartan 50 mg last filled 10/15/2021 90 DS Rosuvastatin 20 mg last filled 07/26/2021 90 DS  April D Calhoun, [place] Pharmacist Assistant [phone]'

## 2021-11-11 NOTE — Progress Notes (Signed)
Benito Mccreedy D.[place] Phone: [phone]   Assessment and Plan:     1. Acute left ankle pain  -Subacute, improving, subsequent visit - Overall improvement in anterior ankle pain with patient using boot for the past 2 weeks. - Discontinue boot use and use supportive tennis shoe - Start HEP and physical therapy -Continue Tylenol 500 to 1000 mg tablets 2-3 times a day for day-to-day pain relief  -Continue tramadol 50 mg every 8 hours as needed for severe breakthrough pain  Pertinent previous records reviewed include none   Follow Up: 3 to 4 weeks for reevaluation.  If no improvement or worsening of symptoms, could consider advanced imaging of the ankle.  Could also consider treatment for left knee if knee pain does not resolve after discontinuing boot use   Subjective:   I, [name], am serving as a Education administrator for Doctor Glennon Mac   Chief Complaint: left ankle  pain and swelling   HPI:    10/09/21 Patient is a 66 year old male complaining of left ankle pain and swelling. Patient states that he fell last week slipped , his foot got caught under the cabinet the swelling is under the ankle and the toe, hurts when he tries to walk. Has been taking tylenol and that does not seem to be helping at all, no locking clicking or popping, can move his toes as  well has an old fracture on his  ankle from along time ago, whole ankle joint feels stiff and throbbing pain    10/29/2021 Patient states he is hurting wasn't able to take meloxicam do to his heart condition   11/12/2021 Patient states Saturday he started feeling some pain into the knee , once he rested he was able to apply pressure , tramadol has been helping      Relevant Historical Information: DM type II, hypertension, elevated BMI, PVD  Additional pertinent review of systems negative.   Current Outpatient Medications:    aspirin EC 81 MG  tablet, Take 81 mg by mouth daily., Disp: , Rfl:    azithromycin (ZITHROMAX) 250 MG tablet, Take 2 tablets on first day, then 1 tablet daily until finished., Disp: 6 tablet, Rfl: 0   B-D ULTRAFINE III SHORT PEN 31G X 8 MM MISC, , Disp: , Rfl: 1   benzonatate (TESSALON) 200 MG capsule, Take 1 capsule (200 mg total) by mouth every 8 (eight) hours as needed for cough., Disp: 40 capsule, Rfl: 0   chlorhexidine (HIBICLENS) 4 % external liquid, Apply topically daily as needed., Disp: 120 mL, Rfl: 0   collagenase (SANTYL) ointment, Apply 1 application topically daily. Right ankle measures 6.0 x 0.3cm., Disp: 30 g, Rfl: 5   diclofenac Sodium (VOLTAREN) 1 % GEL, APPLY 2 GRAMS TOPICALLY 4 TIMES DAILY TO KNEE, Disp: 100 g, Rfl: 2   eplerenone (INSPRA) 25 MG tablet, TAKE 1 TABLET(25 MG) BY MOUTH DAILY, Disp: 90 tablet, Rfl: 1   famotidine (PEPCID) 20 MG tablet, TAKE 1 TABLET(20 MG) BY MOUTH DAILY, Disp: 90 tablet, Rfl: 2   fluticasone (FLONASE) 50 MCG/ACT nasal spray, Place 1 spray into both nostrils daily., Disp: 16 g, Rfl: 12   Fluticasone-Umeclidin-Vilant (TRELEGY ELLIPTA) 100-62.5-25 MCG/ACT AEPB, Inhale 1 puff into the lungs daily., Disp: 180 each, Rfl: 3   glucose blood (ONETOUCH ULTRA) test strip, TEST AS DIRECTED 8 TIMES PER DAY, Disp: 800 strip, Rfl: 0   HUMALOG 100 UNIT/ML injection,  Inject into the skin., Disp: , Rfl:    HUMALOG KWIKPEN 200 UNIT/ML KwikPen, Inject into the skin., Disp: , Rfl:    HUMULIN R U-500 KWIKPEN 500 UNIT/ML kwikpen, Inject 50-60 Units into the skin See admin instructions. 60 units in the AM, 50 units at lunch and 50 in the evening, Disp: 3 mL, Rfl: 0   ipratropium-albuterol (DUONEB) 0.5-2.5 (3) MG/3ML SOLN, 1 vial in nebulizer up to 4 times daily, Disp: 90 mL, Rfl: 12   Lancets (ACCU-CHEK MULTICLIX) lancets, , Disp: , Rfl:    LANTUS SOLOSTAR 100 UNIT/ML Solostar Pen, Inject 55 Units into the skin every morning., Disp: , Rfl:    lidocaine (LIDODERM) 5 %, Place 1 patch onto the  skin daily. Remove and Discard patch within 12 hours or as directed by MD, Disp: 15 patch, Rfl: 0   losartan (COZAAR) 50 MG tablet, TAKE 1/2 TABLET(25 MG) BY MOUTH DAILY, Disp: 45 tablet, Rfl: 1   MAGNESIUM-OXIDE 400 (241.3 Mg) MG tablet, Take 1 tablet by mouth 2 (two) times daily., Disp: , Rfl:    meloxicam (MOBIC) 15 MG tablet, Take 1 tablet (15 mg total) by mouth daily., Disp: 30 tablet, Rfl: 0   metoprolol tartrate (LOPRESSOR) 50 MG tablet, TAKE 1 TABLET(50 MG) BY MOUTH TWICE DAILY, Disp: 180 tablet, Rfl: 2   nitroGLYCERIN (NITROSTAT) 0.4 MG SL tablet, Place 1 tablet (0.4 mg total) under the tongue every 5 (five) minutes as needed for chest pain., Disp: 25 tablet, Rfl: 3   nystatin ointment (MYCOSTATIN), Apply 1 application topically 2 (two) times daily., Disp: 30 g, Rfl: 0   ofloxacin (OCUFLOX) 0.3 % ophthalmic solution, Place 1 drop into the right eye 4 (four) times daily., Disp: , Rfl:    polyethylene glycol (MIRALAX / GLYCOLAX) 17 g packet, Take 17 g by mouth daily., Disp: 14 each, Rfl: 0   potassium chloride SA (KLOR-CON) 20 MEQ tablet, TAKE 2 TABLETS(40 MEQ) BY MOUTH THREE TIMES DAILY, Disp: 540 tablet, Rfl: 0   prednisoLONE acetate (PRED FORTE) 1 % ophthalmic suspension, Place 1 drop into the right eye 4 (four) times daily., Disp: , Rfl:    pregabalin (LYRICA) 50 MG capsule, Take 1 capsule (50 mg total) by mouth at bedtime as needed (nerve pain)., Disp: 30 capsule, Rfl: 0   PROAIR HFA 108 (90 Base) MCG/ACT inhaler, Inhale 2 puffs into the lungs every 6 (six) hours as needed for wheezing or shortness of breath., Disp: 8.5 g, Rfl: 5   rosuvastatin (CRESTOR) 20 MG tablet, Take 1 tablet (20 mg total) by mouth every evening., Disp: 30 tablet, Rfl: 11   silver sulfADIAZINE (SSD) 1 % cream, APPLY PEA SIZED AMOUNT TOPICALLY TO WOUND DAILY, Disp: 50 g, Rfl: 0   torsemide (DEMADEX) 20 MG tablet, TAKE 1 TABLET(20 MG) BY MOUTH TWICE DAILY, Disp: 180 tablet, Rfl: 2   Objective:     Vitals:    11/12/21 1131  BP: 132/78  Pulse: 78  SpO2: 92%  Weight: (!) 301 lb (136.5 kg)  Height: '5\' 11"'$  (1.803 m)      Body mass index is 41.98 kg/m.    Physical Exam:     Gen: Appears well, nad, nontoxic and pleasant Psych: Alert and oriented, appropriate mood and affect Neuro: sensation intact, strength is 5/5 with df/pf/inv/ev, muscle tone wnl Skin: no susupicious lesions or rashes.  Generalized bilateral lower extremity swelling with peripheral vascular disease changes   Left ankle: no deformity, generalized swelling and effusion effusion, mildly increased compared  to right TTP mildly anterior ankle joint space NTTP over navicular, fibular head, lat mal, medial mal, achilles,  , base of 5th, ATFL, CFL, deltoid, calcaneous or midfoot ROM DF 20, PF 35, inv/ev intact Negative ant drawer, talar tilt, rotation test, squeeze test. Neg thompson No pain with resisted inversion or eversion    Electronically signed by:  Benito Mccreedy D.[facility] 11:45 AM 11/12/21

## 2021-11-12 ENCOUNTER — Ambulatory Visit (INDEPENDENT_AMBULATORY_CARE_PROVIDER_SITE_OTHER): Payer: Medicare Other | Admitting: Sports Medicine

## 2021-11-12 VITALS — BP 132/78 | HR 78 | Ht 71.0 in | Wt 301.0 lb

## 2021-11-12 DIAGNOSIS — M25572 Pain in left ankle and joints of left foot: Secondary | ICD-10-CM

## 2021-11-12 NOTE — Patient Instructions (Addendum)
Good to see you Continue Tylenol [phone] mg 2-3 times a day for pain relief  Tramadol as needed for breakthrough pain   Can transition out of walking boot , can use a tennis shoe  Ankle HEP  Pt referral  3 week follow up

## 2021-11-14 DIAGNOSIS — J45998 Other asthma: Secondary | ICD-10-CM | POA: Diagnosis not present

## 2021-11-28 ENCOUNTER — Ambulatory Visit: Payer: Medicare Other

## 2021-11-28 ENCOUNTER — Ambulatory Visit (INDEPENDENT_AMBULATORY_CARE_PROVIDER_SITE_OTHER): Payer: Medicare Other

## 2021-11-28 VITALS — Wt 301.0 lb

## 2021-11-28 DIAGNOSIS — Z1211 Encounter for screening for malignant neoplasm of colon: Secondary | ICD-10-CM | POA: Diagnosis not present

## 2021-11-28 DIAGNOSIS — Z0000 Encounter for general adult medical examination without abnormal findings: Secondary | ICD-10-CM

## 2021-11-28 NOTE — Patient Instructions (Signed)
[name] , Thank you for taking time to come for your Medicare Wellness Visit. I appreciate your ongoing commitment to your health goals. Please review the following plan we discussed and let me know if I can assist you in the future.   These are the goals we discussed:  Goals      DIET - DECREASE SODA OR JUICE INTAKE     Patient drinks a lot of coke and is going to start working towards drinking more water      DIET - REDUCE SODIUM INTAKE     Patient is going to work on changing eating habits to fewer fried foods and sodium filled meats      Monitor and Manage My Blood Sugar-Diabetes Type 2     Timeframe:  Long-Range Goal Priority:  High Start Date:   05/14/21                          Expected End Date: 11/13/21                      Follow Up Date 08/13/21    - check blood sugar before and after exercise - check blood sugar if I feel it is too high or too low - take the blood sugar log to all doctor visits    Why is this important?   Checking your blood sugar at home helps to keep it from getting very high or very low.  Writing the results in a diary or log helps the doctor know how to care for you.  Your blood sugar log should have the time, date and the results.  Also, write down the amount of insulin or other medicine that you take.  Other information, like what you ate, exercise done and how you were feeling, will also be helpful.     Notes:   05/14/21 - working on decreasing regular sodas!     Patient Stated     Lose weight     Patient Stated     Lose weight and cut back on eating late         This is a list of the screening recommended for you and due dates:  Health Maintenance  Topic Date Due   Zoster (Shingles) Vaccine (1 of 2) Never done   COVID-19 Vaccine (3 - Pfizer risk series) 07/04/2019   Eye exam for diabetics  03/13/2021   Yearly kidney health urinalysis for diabetes  08/03/2021   Flu Shot  09/10/2021   Complete foot exam   11/02/2021    Hemoglobin A1C  11/09/2021   Pneumonia Vaccine (2 - PCV) 05/28/2022*   Colon Cancer Screening  05/28/2022*   Yearly kidney function blood test for diabetes  07/31/2022   Tetanus Vaccine  08/31/2029   Hepatitis C Screening: USPSTF Recommendation to screen - Ages 18-79 yo.  Completed   HPV Vaccine  Aged Out  *Topic was postponed. The date shown is not the original due date.    Advanced directives: Advance directive discussed with you today. Even though you declined this today please call our office should you change your mind and we can give you the proper paperwork for you to fill out.  Conditions/risks identified: maintian health   Next appointment: Follow up in one year for your annual wellness visit.   Preventive Care 68 Years and Older, Male  Preventive care refers to lifestyle choices and visits with your health  care provider that can promote health and wellness. What does preventive care include? A yearly physical exam. This is also called an annual well check. Dental exams once or twice a year. Routine eye exams. Ask your health care provider how often you should have your eyes checked. Personal lifestyle choices, including: Daily care of your teeth and gums. Regular physical activity. Eating a healthy diet. Avoiding tobacco and drug use. Limiting alcohol use. Practicing safe sex. Taking low doses of aspirin every day. Taking vitamin and mineral supplements as recommended by your health care provider. What happens during an annual well check? The services and screenings done by your health care provider during your annual well check will depend on your age, overall health, lifestyle risk factors, and family history of disease. Counseling  Your health care provider may ask you questions about your: Alcohol use. Tobacco use. Drug use. Emotional well-being. Home and relationship well-being. Sexual activity. Eating habits. History of falls. Memory and ability to  understand (cognition). Work and work Statistician. Screening  You may have the following tests or measurements: Height, weight, and BMI. Blood pressure. Lipid and cholesterol levels. These may be checked every 5 years, or more frequently if you are over 39 years old. Skin check. Lung cancer screening. You may have this screening every year starting at age 35 if you have a 30-pack-year history of smoking and currently smoke or have quit within the past 15 years. Fecal occult blood test (FOBT) of the stool. You may have this test every year starting at age 43. Flexible sigmoidoscopy or colonoscopy. You may have a sigmoidoscopy every 5 years or a colonoscopy every 10 years starting at age 4. Prostate cancer screening. Recommendations will vary depending on your family history and other risks. Hepatitis C blood test. Hepatitis B blood test. Sexually transmitted disease (STD) testing. Diabetes screening. This is done by checking your blood sugar (glucose) after you have not eaten for a while (fasting). You may have this done every 1-3 years. Abdominal aortic aneurysm (AAA) screening. You may need this if you are a current or former smoker. Osteoporosis. You may be screened starting at age 26 if you are at high risk. Talk with your health care provider about your test results, treatment options, and if necessary, the need for more tests. Vaccines  Your health care provider may recommend certain vaccines, such as: Influenza vaccine. This is recommended every year. Tetanus, diphtheria, and acellular pertussis (Tdap, Td) vaccine. You may need a Td booster every 10 years. Zoster vaccine. You may need this after age 41. Pneumococcal 13-valent conjugate (PCV13) vaccine. One dose is recommended after age 41. Pneumococcal polysaccharide (PPSV23) vaccine. One dose is recommended after age 63. Talk to your health care provider about which screenings and vaccines you need and how often you need them. This  information is not intended to replace advice given to you by your health care provider. Make sure you discuss any questions you have with your health care provider. Document Released: 02/23/2015 Document Revised: 10/17/2015 Document Reviewed: 11/28/2014 Elsevier Interactive Patient Education  2017 [place] Prevention in the Home Falls can cause injuries. They can happen to people of all ages. There are many things you can do to make your home safe and to help prevent falls. What can I do on the outside of my home? Regularly fix the edges of walkways and driveways and fix any cracks. Remove anything that might make you trip as you walk through a door, such  as a raised step or threshold. Trim any bushes or trees on the path to your home. Use bright outdoor lighting. Clear any walking paths of anything that might make someone trip, such as rocks or tools. Regularly check to see if handrails are loose or broken. Make sure that both sides of any steps have handrails. Any raised decks and porches should have guardrails on the edges. Have any leaves, snow, or ice cleared regularly. Use sand or salt on walking paths during winter. Clean up any spills in your garage right away. This includes oil or grease spills. What can I do in the bathroom? Use night lights. Install grab bars by the toilet and in the tub and shower. Do not use towel bars as grab bars. Use non-skid mats or decals in the tub or shower. If you need to sit down in the shower, use a plastic, non-slip stool. Keep the floor dry. Clean up any water that spills on the floor as soon as it happens. Remove soap buildup in the tub or shower regularly. Attach bath mats securely with double-sided non-slip rug tape. Do not have throw rugs and other things on the floor that can make you trip. What can I do in the bedroom? Use night lights. Make sure that you have a light by your bed that is easy to reach. Do not use any sheets or  blankets that are too big for your bed. They should not hang down onto the floor. Have a firm chair that has side arms. You can use this for support while you get dressed. Do not have throw rugs and other things on the floor that can make you trip. What can I do in the kitchen? Clean up any spills right away. Avoid walking on wet floors. Keep items that you use a lot in easy-to-reach places. If you need to reach something above you, use a strong step stool that has a grab bar. Keep electrical cords out of the way. Do not use floor polish or wax that makes floors slippery. If you must use wax, use non-skid floor wax. Do not have throw rugs and other things on the floor that can make you trip. What can I do with my stairs? Do not leave any items on the stairs. Make sure that there are handrails on both sides of the stairs and use them. Fix handrails that are broken or loose. Make sure that handrails are as long as the stairways. Check any carpeting to make sure that it is firmly attached to the stairs. Fix any carpet that is loose or worn. Avoid having throw rugs at the top or bottom of the stairs. If you do have throw rugs, attach them to the floor with carpet tape. Make sure that you have a light switch at the top of the stairs and the bottom of the stairs. If you do not have them, ask someone to add them for you. What else can I do to help prevent falls? Wear shoes that: Do not have high heels. Have rubber bottoms. Are comfortable and fit you well. Are closed at the toe. Do not wear sandals. If you use a stepladder: Make sure that it is fully opened. Do not climb a closed stepladder. Make sure that both sides of the stepladder are locked into place. Ask someone to hold it for you, if possible. Clearly mark and make sure that you can see: Any grab bars or handrails. First and last steps. Where the edge  of each step is. Use tools that help you move around (mobility aids) if they are  needed. These include: Canes. Walkers. Scooters. Crutches. Turn on the lights when you go into a dark area. Replace any light bulbs as soon as they burn out. Set up your furniture so you have a clear path. Avoid moving your furniture around. If any of your floors are uneven, fix them. If there are any pets around you, be aware of where they are. Review your medicines with your doctor. Some medicines can make you feel dizzy. This can increase your chance of falling. Ask your doctor what other things that you can do to help prevent falls. This information is not intended to replace advice given to you by your health care provider. Make sure you discuss any questions you have with your health care provider. Document Released: 11/23/2008 Document Revised: 07/05/2015 Document Reviewed: 03/03/2014 Elsevier Interactive Patient Education  2017 [employer].

## 2021-11-28 NOTE — Progress Notes (Signed)
I connected with  [name] [name] on 11/28/21 by a audio enabled telemedicine application and verified that I am speaking with the correct person using two identifiers.  Patient Location: Home  Provider Location: Office/Clinic  I discussed the limitations of evaluation and management by telemedicine. The patient expressed understanding and agreed to proceed.   Subjective:   [name] [name] is a 66 y.o. male who presents for Medicare Annual/Subsequent preventive examination.  Review of Systems     Cardiac Risk Factors include: advanced age (>56mn, >>54women);hypertension;diabetes mellitus;dyslipidemia;male gender;obesity (BMI >30kg/m2)     Objective:    Today's Vitals   11/28/21 0958  Weight: (!) 301 lb (136.5 kg)   Body mass index is 41.98 kg/m.     11/28/2021   10:05 AM 11/15/2020    9:43 AM 09/02/2020    2:35 AM 11/10/2019    9:47 AM 09/02/2019   12:27 AM 09/01/2019    2:52 PM 10/13/2018    2:50 PM  Advanced Directives  Does Patient Have a Medical Advance Directive? No No No No No No No  Would patient like information on creating a medical advance directive? No - Patient declined No - Patient declined  No - Patient declined No - Patient declined  Yes (MAU/Ambulatory/Procedural Areas - Information given)    Current Medications (verified) Outpatient Encounter Medications as of 11/28/2021  Medication Sig   aspirin EC 81 MG tablet Take 81 mg by mouth daily.   B-D ULTRAFINE III SHORT PEN 31G X 8 MM MISC    benzonatate (TESSALON) 200 MG capsule Take 1 capsule (200 mg total) by mouth every 8 (eight) hours as needed for cough.   chlorhexidine (HIBICLENS) 4 % external liquid Apply topically daily as needed.   collagenase (SANTYL) ointment Apply 1 application topically daily. Right ankle measures 6.0 x 0.3cm.   diclofenac Sodium (VOLTAREN) 1 % GEL APPLY 2 GRAMS TOPICALLY 4 TIMES DAILY TO KNEE   eplerenone (INSPRA) 25 MG tablet TAKE 1 TABLET(25 MG) BY MOUTH DAILY   famotidine  (PEPCID) 20 MG tablet TAKE 1 TABLET(20 MG) BY MOUTH DAILY   fluticasone (FLONASE) 50 MCG/ACT nasal spray Place 1 spray into both nostrils daily.   Fluticasone-Umeclidin-Vilant (TRELEGY ELLIPTA) 100-62.5-25 MCG/ACT AEPB Inhale 1 puff into the lungs daily.   glucose blood (ONETOUCH ULTRA) test strip TEST AS DIRECTED 8 TIMES PER DAY   HUMALOG 100 UNIT/ML injection Inject into the skin.   HUMALOG KWIKPEN 200 UNIT/ML KwikPen Inject into the skin.   HUMULIN R U-500 KWIKPEN 500 UNIT/ML kwikpen Inject 50-60 Units into the skin See admin instructions. 60 units in the AM, 50 units at lunch and 50 in the evening   ipratropium-albuterol (DUONEB) 0.5-2.5 (3) MG/3ML SOLN 1 vial in nebulizer up to 4 times daily   Lancets (ACCU-CHEK MULTICLIX) lancets    LANTUS SOLOSTAR 100 UNIT/ML Solostar Pen Inject 55 Units into the skin every morning.   lidocaine (LIDODERM) 5 % Place 1 patch onto the skin daily. Remove and Discard patch within 12 hours or as directed by MD   losartan (COZAAR) 50 MG tablet TAKE 1/2 TABLET(25 MG) BY MOUTH DAILY   MAGNESIUM-OXIDE 400 (241.3 Mg) MG tablet Take 1 tablet by mouth 2 (two) times daily.   meloxicam (MOBIC) 15 MG tablet Take 1 tablet (15 mg total) by mouth daily.   metoprolol tartrate (LOPRESSOR) 50 MG tablet TAKE 1 TABLET(50 MG) BY MOUTH TWICE DAILY   nitroGLYCERIN (NITROSTAT) 0.4 MG SL tablet Place 1 tablet (0.4 mg  total) under the tongue every 5 (five) minutes as needed for chest pain.   nystatin ointment (MYCOSTATIN) Apply 1 application topically 2 (two) times daily.   ofloxacin (OCUFLOX) 0.3 % ophthalmic solution Place 1 drop into the right eye 4 (four) times daily.   polyethylene glycol (MIRALAX / GLYCOLAX) 17 g packet Take 17 g by mouth daily.   potassium chloride SA (KLOR-CON) 20 MEQ tablet TAKE 2 TABLETS(40 MEQ) BY MOUTH THREE TIMES DAILY   prednisoLONE acetate (PRED FORTE) 1 % ophthalmic suspension Place 1 drop into the right eye 4 (four) times daily.   pregabalin (LYRICA)  50 MG capsule Take 1 capsule (50 mg total) by mouth at bedtime as needed (nerve pain).   PROAIR HFA 108 (90 Base) MCG/ACT inhaler Inhale 2 puffs into the lungs every 6 (six) hours as needed for wheezing or shortness of breath.   rosuvastatin (CRESTOR) 20 MG tablet Take 1 tablet (20 mg total) by mouth every evening.   silver sulfADIAZINE (SSD) 1 % cream APPLY PEA SIZED AMOUNT TOPICALLY TO WOUND DAILY   torsemide (DEMADEX) 20 MG tablet TAKE 1 TABLET(20 MG) BY MOUTH TWICE DAILY   [DISCONTINUED] azithromycin (ZITHROMAX) 250 MG tablet Take 2 tablets on first day, then 1 tablet daily until finished.   No facility-administered encounter medications on file as of 11/28/2021.    Allergies (verified) Empagliflozin and Ibuprofen   History: Past Medical History:  Diagnosis Date   Acute myocardial infarction, unspecified site, episode of care unspecified 2005   Acute pancreatitis    CAD (coronary artery disease)    a. CABG in 2005 w LIMA to LAD, left radial to second circumflex marginal, saphenous vein graft to PDA, saphenous vein graft to lateral subbrach of ramus intermediate, and sequential saphenous vein graft to the medial subbranch of ramus intermediate.   Chronic diastolic CHF (congestive heart failure) (HCC)    Cirrhosis of liver without mention of alcohol    CKD (chronic kidney disease), stage II    Complications affecting other specified body systems, hypertension    Ectopic atrial rhythm    Essential hypertension    Hyperlipidemia    Hypokalemia    a. intermitttent noncompliance with potassium supplement.   Hypomagnesemia    Morbid obesity (HCC)    Neuropathy    Other and unspecified hyperlipidemia    Proteinuria    RBBB    Sleep apnea    Type II or unspecified type diabetes mellitus without mention of complication, not stated as uncontrolled    Varicose veins of both lower extremities    Past Surgical History:  Procedure Laterality Date   CORONARY ARTERY BYPASS GRAFT     2005    I & D EXTREMITY Left 09/02/2019   Procedure: IRRIGATION AND DEBRIDEMENT 4th TOE DEBRIDIMENT;  Surgeon: Evelina Bucy, DPM;  Location: [facility];  Service: Podiatry;  Laterality: Left;   Family History  Problem Relation Age of Onset   Breast cancer Mother    Prostate cancer Father    Coronary artery disease Father        underwent CABG   Social History   Socioeconomic History   Marital status: Significant Other    Spouse name: Not on file   Number of children: Not on file   Years of education: Not on file   Highest education level: Not on file  Occupational History   Occupation: Disabled     Comment: previously worked in delivery   Tobacco Use   Smoking status: Former  Packs/day: 1.50    Years: 30.00    Total pack years: 45.00    Types: Cigarettes    Quit date: 10/20/2004    Years since quitting: 17.1   Smokeless tobacco: Never  Vaping Use   Vaping Use: Never used  Substance and Sexual Activity   Alcohol use: Yes    Comment: occasional   Drug use: No   Sexual activity: Not on file  Other Topics Concern   Not on file  Social History Narrative   Lives with significant other x 30+ years, apartment "friends smoke". Disability. Is disabled secondary to his work-related injury.       Has over 20 grandchildren    Social Determinants of Health   Financial Resource Strain: Low Risk  (11/28/2021)   Overall Financial Resource Strain (CARDIA)    Difficulty of Paying Living Expenses: Not hard at all  Food Insecurity: No Food Insecurity (11/28/2021)   Hunger Vital Sign    Worried About Running Out of Food in the Last Year: Never true    Ran Out of Food in the Last Year: Never true  Transportation Needs: No Transportation Needs (11/28/2021)   PRAPARE - Hydrologist (Medical): No    Lack of Transportation (Non-Medical): No  Physical Activity: Sufficiently Active (11/28/2021)   Exercise Vital Sign    Days of Exercise per Week: 5 days    Minutes  of Exercise per Session: 30 min  Stress: No Stress Concern Present (11/28/2021)   [place]    Feeling of Stress : Not at all  Social Connections: Socially Isolated (11/28/2021)   Social Connection and Isolation Panel [NHANES]    Frequency of Communication with Friends and Family: More than three times a week    Frequency of Social Gatherings with Friends and Family: More than three times a week    Attends Religious Services: Never    Marine scientist or Organizations: No    Attends Music therapist: Never    Marital Status: Never married    Tobacco Counseling Counseling given: Not Answered   Clinical Intake:  Pre-visit preparation completed: Yes  Pain : No/denies pain     BMI - recorded: 41.98 Nutritional Status: BMI > 30  Obese Nutritional Risks: None Diabetes: Yes CBG done?: No Did pt. bring in CBG monitor from home?: No  How often do you need to have someone help you when you read instructions, pamphlets, or other written materials from your doctor or pharmacy?: 1 - Never  Diabetic?Nutrition Risk Assessment:  Has the patient had any N/V/D within the last 2 months?  No  Does the patient have any non-healing wounds?  No  Has the patient had any unintentional weight loss or weight gain?  No   Diabetes:  Is the patient diabetic?  Yes  If diabetic, was a CBG obtained today?  No  Did the patient bring in their glucometer from home?  No  How often do you monitor your CBG's? daily.   Financial Strains and Diabetes Management:  Are you having any financial strains with the device, your supplies or your medication? No .  Does the patient want to be seen by Chronic Care Management for management of their diabetes?  No  Would the patient like to be referred to a Nutritionist or for Diabetic Management?  No   Diabetic Exams:  Diabetic Eye Exam: Overdue for diabetic eye exam. Pt has  been advised about the importance in completing this exam. Patient advised to call and schedule an eye exam. Diabetic Foot Exam: Overdue, Pt has been advised about the importance in completing this exam. Pt is scheduled for diabetic foot exam on next appt .   Interpreter Needed?: No  Information entered by :: Charlott Rakes, LPN   Activities of Daily Living    11/28/2021   10:07 AM  In your present state of health, do you have any difficulty performing the following activities:  Hearing? 0  Vision? 0  Difficulty concentrating or making decisions? 0  Walking or climbing stairs? 1  Comment at this time  Dressing or bathing? 0  Doing errands, shopping? 0  Preparing Food and eating ? N  Using the Toilet? N  In the past six months, have you accidently leaked urine? N  Do you have problems with loss of bowel control? N  Managing your Medications? N  Managing your Finances? N  Housekeeping or managing your Housekeeping? N    Patient Care Team: [name], MD as PCP - General (Family Medicine) [name], MD as PCP - Cardiology (Cardiology) Delrae Rend, MD as Consulting Physician (Endocrinology) Gardiner Barefoot, DPM as Consulting Physician (Podiatry) Deneise Lever, MD as Consulting Physician (Pulmonary Disease) [name], [name], NP (Inactive) as Nurse Practitioner (Vascular Surgery) [facility] as Consulting Physician Edythe Clarity, [facility] as Pharmacist (Pharmacist)  Indicate any recent Medical Services you may have received from other than [facility] providers in the past year (date may be approximate).     Assessment:   This is a routine wellness examination for Mats.  Hearing/Vision screen Hearing Screening - Comments:: Pt stated slight loss  Vision Screening - Comments:: Pt follows up with Dr Katy Fitch for annual eye exams   Dietary issues and exercise activities discussed: Current Exercise Habits: Home exercise routine, Type of exercise:  walking, Time (Minutes): 30, Frequency (Times/Week): 5, Weekly Exercise (Minutes/Week): 150   Goals Addressed             This Visit's Progress    Patient Stated       Maintain health        Depression Screen    11/28/2021   10:03 AM 11/15/2020    9:42 AM 11/10/2019    9:44 AM 04/04/2019    2:19 PM 10/05/2018    9:07 AM 03/24/2016    1:55 PM  PHQ 2/9 Scores  PHQ - 2 Score 0 0 0 0 0 0  PHQ- 9 Score     0     Fall Risk    11/28/2021   10:06 AM 11/15/2020    9:44 AM 11/10/2019    9:48 AM 04/04/2019    2:18 PM 10/13/2018    2:58 PM  Fall Risk   Falls in the past year? 1 0 1 1 0  Number falls in past yr: 1 0 1 0 0  Injury with Fall? 1 0 0 1 0  Comment left ankle FX   left knee brusied   Risk for fall due to : Impaired vision;Impaired mobility;Impaired balance/gait Impaired vision History of fall(s);Impaired balance/gait;Impaired mobility;Impaired vision  Impaired balance/gait  Follow up Falls prevention discussed Falls prevention discussed Falls prevention discussed  Education provided    FALL RISK PREVENTION PERTAINING TO THE HOME:  Any stairs in or around the home? No  If so, are there any without handrails? No  Home free of loose throw rugs in walkways, pet  beds, electrical cords, etc? Yes  Adequate lighting in your home to reduce risk of falls? Yes   ASSISTIVE DEVICES UTILIZED TO PREVENT FALLS:  Life alert? Yes apple watch  Use of a cane, walker or w/c? Yes  Grab bars in the bathroom? Yes  Shower chair or bench in shower? Yes  Elevated toilet seat or a handicapped toilet? No   TIMED UP AND GO:  Was the test performed? No .    Cognitive Function:        11/28/2021   10:07 AM 11/15/2020    9:46 AM 11/10/2019    9:53 AM  6CIT Screen  What Year? 0 points 0 points 0 points  What month? 0 points 0 points 0 points  What time? 0 points 0 points   Count back from 20 0 points 0 points 0 points  Months in reverse 0 points 0 points 0 points  Repeat phrase 0  points 0 points 2 points  Total Score 0 points 0 points     Immunizations Immunization History  Administered Date(s) Administered   Influenza Split 01/29/2009, 11/19/2009, 11/11/2010, 10/23/2011, 11/10/2012, 01/31/2013, 01/24/2014, 10/20/2014   Influenza, High Dose Seasonal PF 11/02/2020   Influenza,inj,Quad PF,6+ Mos 11/10/2013, 11/30/2015, 12/10/2016   Influenza-Unspecified 11/30/2015, 11/01/2020   PFIZER(Purple Top)SARS-COV-2 Vaccination 05/17/2019, 06/06/2019   Pneumococcal Polysaccharide-23 06/26/2008   Tdap 09/01/2019    TDAP status: Up to date  Flu Vaccine status: Due, Education has been provided regarding the importance of this vaccine. Advised may receive this vaccine at local pharmacy or Health Dept. Aware to provide a copy of the vaccination record if obtained from local pharmacy or Health Dept. Verbalized acceptance and understanding.  Pneumococcal vaccine status: Due, Education has been provided regarding the importance of this vaccine. Advised may receive this vaccine at local pharmacy or Health Dept. Aware to provide a copy of the vaccination record if obtained from local pharmacy or Health Dept. Verbalized acceptance and understanding.  Covid-19 vaccine status: Completed vaccines  Qualifies for Shingles Vaccine? Yes   Zostavax completed No   Shingrix Completed?: No.    Education has been provided regarding the importance of this vaccine. Patient has been advised to call insurance company to determine out of pocket expense if they have not yet received this vaccine. Advised may also receive vaccine at local pharmacy or Health Dept. Verbalized acceptance and understanding.  Screening Tests Health Maintenance  Topic Date Due   Zoster Vaccines- Shingrix (1 of 2) Never done   COVID-19 Vaccine (3 - Pfizer risk series) 07/04/2019   OPHTHALMOLOGY EXAM  03/13/2021   Diabetic kidney evaluation - Urine ACR  08/03/2021   INFLUENZA VACCINE  09/10/2021   FOOT EXAM  11/02/2021    HEMOGLOBIN A1C  11/09/2021   Pneumonia Vaccine 71+ Years old (2 - PCV) 05/28/2022 (Originally 01/17/2019)   COLONOSCOPY (Pts 45-68yr Insurance coverage will need to be confirmed)  05/28/2022 (Originally 01/17/1999)   Diabetic kidney evaluation - GFR measurement  07/31/2022   TETANUS/TDAP  08/31/2029   Hepatitis C Screening  Completed   HPV VACCINES  Aged Out    Health Maintenance  Health Maintenance Due  Topic Date Due   Zoster Vaccines- Shingrix (1 of 2) Never done   COVID-19 Vaccine (3 - Pfizer risk series) 07/04/2019   OPHTHALMOLOGY EXAM  03/13/2021   Diabetic kidney evaluation - Urine ACR  08/03/2021   INFLUENZA VACCINE  09/10/2021   FOOT EXAM  11/02/2021   HEMOGLOBIN A1C  11/09/2021    Colorectal  cancer screening: Referral to GI placed 11/28/21. Pt aware the office will call re: appt.  Additional Screening:  Hepatitis C Screening: Completed 10/25/*12  Vision Screening: Recommended annual ophthalmology exams for early detection of glaucoma and other disorders of the eye. Is the patient up to date with their annual eye exam?  No  Who is the provider or what is the name of the office in which the patient attends annual eye exams? Dr Katy Fitch If pt is not established with a provider, would they like to be referred to a provider to establish care? No .   Dental Screening: Recommended annual dental exams for proper oral hygiene  Community Resource Referral / Chronic Care Management: CRR required this visit?  No   CCM required this visit?  No      Plan:     I have personally reviewed and noted the following in the patient's chart:   Medical and social history Use of alcohol, tobacco or illicit drugs  Current medications and supplements including opioid prescriptions. Patient is not currently taking opioid prescriptions. Functional ability and status Nutritional status Physical activity Advanced directives List of other physicians Hospitalizations, surgeries, and ER  visits in previous 12 months Vitals Screenings to include cognitive, depression, and falls Referrals and appointments  In addition, I have reviewed and discussed with patient certain preventive protocols, quality metrics, and best practice recommendations. A written personalized care plan for preventive services as well as general preventive health recommendations were provided to patient.     [name], LPN   [id number]   Nurse Notes: none

## 2021-11-29 DIAGNOSIS — I739 Peripheral vascular disease, unspecified: Secondary | ICD-10-CM | POA: Diagnosis not present

## 2021-11-29 DIAGNOSIS — D492 Neoplasm of unspecified behavior of bone, soft tissue, and skin: Secondary | ICD-10-CM | POA: Diagnosis not present

## 2021-11-29 DIAGNOSIS — E1151 Type 2 diabetes mellitus with diabetic peripheral angiopathy without gangrene: Secondary | ICD-10-CM | POA: Diagnosis not present

## 2021-11-29 DIAGNOSIS — E1142 Type 2 diabetes mellitus with diabetic polyneuropathy: Secondary | ICD-10-CM | POA: Diagnosis not present

## 2021-11-29 NOTE — Progress Notes (Deleted)
HPI  male former smoker followed for OSA, chronic bronchitis, SAR, CAD/MI/CABG/ CHF, HBP, DM, cirrhosis liver, Continues oxygen 2 L for sleep/ [employer]. PFT 06/18/2011-moderate restriction. Flows are normal for measure volume with FEV1/FVC 0.84 and insignificant response to bronchodilator. DLCO 52%. HST-08/05/2016-AHI 37.5/hour, desaturation to 67%, body weight 294 pounds HST 09/08/18- AHI 32.4/ hr, desaturation to 73%- avg 89%, body weight 298 lbs -------------------------------------------------------------------------------  01/16/21-  66 year old male former smoker(45 pk yrs) followed for OSA, chronic bronchitis, SAR, CAD/MI/CABG, dCHF, DM2, PVD, hepatic cirrhosis, Morbid Obesity, Covid infection July 2022,  CPAP auto 5-20/ [employer]  Ordered 11/01/2018 Download- compliance Body weight today-309 lbs Covid vax-2 phizer                          Flu vax-had LOV NP Volanda Napoleon 11/7- bronchitis, rhinitis> flonase, zyrtec, saline spray, pred taper, mucinex, PFT -----C/o cough-white since July 2022 with covid PFT scheduled for 12/19. Not using CPAP or O2- in storage since Spring. He says his nebulizer machine and his CPAP machine are in the back of the storage unit since he moved.  I emphasized that it was time to go in and pull those out, resume CPAP use and get ready for winter. He describes some persistent cough with scant white sputum since COVID infection in July.  Cough is noticed most when lying down or if he feels hot.  He does not seem to be coughing with food or drink and he does not notice wheeze. He indicates a lymph node under the angle of right mandible that he can feel sometimes, nontender.  I have asked him to point this out to his primary physician.  He does not think it is growing and is not aware of any others. CXR 12/17/20 IMPRESSION: Central pulmonary vascular prominence and persistent mild central peribronchial thickening. No new airspace disease.  12/02/21- 66 year old male former  smoker(45 pk yrs) followed for OSA, chronic bronchitis, SAR, CAD/MI/CABG, dCHF, DM2, PVD, hepatic cirrhosis, Morbid Obesity, Covid infection July 2022,  -Trelegy 100, Neb Duoneb, Proair hfa,  CPAP auto 5-20/ [employer]  Ordered 11/01/2018 Download- compliance Body weight today- Covid vax-2 phizer                          Flu vax-had LOV Cobb, NP 7/18- had ben Rx'd in June for exacerb COPD > pred, Zpak.Restart Trelegy.Restart CPAP.  CXR 08/08/21- IMPRESSION: 1. No acute cardiopulmonary disease.    ROS-see HPI   + = positive Constitutional:   No-   weight loss, night sweats, fevers, chills, + fatigue, lassitude. HEENT:   No-  headaches, difficulty swallowing, tooth/dental problems, sore throat,       No-  sneezing, itching, ear ache, nasal congestion, post nasal drip,  CV:  No-   chest pain, orthopnea, PND, + swelling in lower extremities, anasarca,  dizziness, palpitations Resp: + Shortness of breath with exertion or at rest.              No-   productive cough,  No non-productive cough,  No- coughing up of blood.              No-   change in color of mucus.  + Occasional wheezing.   Skin: No-   rash or lesions. GI:  No-   heartburn, indigestion, abdominal pain, nausea, vomiting,  GU: . MS:  No-   joint pain or swelling.   Neuro-     nothing  unusual Psych:  No- change in mood or affect. No depression or anxiety.  No memory loss.   OBJ- Physical Exam General- Alert, Oriented, Affect-appropriate, Distress- none acute, +obese,  Skin- +stasis changes legs, + Increased skin turgor? Edema Lymphadenopathy- + thick neck.  I feel some fullness in the area indicated and have asked him to show this to his PCP. Head- atraumatic            Eyes- Gross vision intact, PERRLA, conjunctivae and secretions clear            Ears- Hearing, canals-normal            Nose- Clear, no-Septal dev, mucus, polyps, erosion, perforation             Throat- Mallampati IV , mucosa clear , drainage- none, tonsils-  atrophic, +missing teeth Neck- flexible , trachea midline, no stridor , thyroid nl, carotid no bruit Chest - symmetrical excursion , unlabored           Heart/CV- RRR , no murmur , no gallop  , no rub, nl s1 s2                           - JVD- none , edema- none, stasis changes- none, varices- none           Lung- clear to P&A, wheeze- none, cough- none , dullness-none, rub- none           Chest wall- sternotomy scar Abd-  Br/ Gen/ Rectal- Not done, not indicated Extrem- cyanosis- none, clubbing, none, atrophy- none, strength- nl. Vascular graft donor site scar left forearm. + Cane, + chronic brawny peripheral edema w stasis changes in calves. Neuro- grossly intact to observation

## 2021-12-02 ENCOUNTER — Ambulatory Visit: Payer: Medicare Other | Admitting: Internal Medicine

## 2021-12-02 NOTE — Progress Notes (Deleted)
[name] [name] D.[place] Phone: [phone]   Assessment and Plan:     There are no diagnoses linked to this encounter.  ***   Pertinent previous records reviewed include ***   Follow Up: ***     Subjective:   I, [name] [name], am serving as a Education administrator for Doctor Glennon Mac   Chief Complaint: left ankle  pain and swelling   HPI:    10/09/21 Patient is a 66 year old male complaining of left ankle pain and swelling. Patient states that he fell last week slipped , his foot got caught under the cabinet the swelling is under the ankle and the toe, hurts when he tries to walk. Has been taking tylenol and that does not seem to be helping at all, no locking clicking or popping, can move his toes as  well has an old fracture on his  ankle from along time ago, whole ankle joint feels stiff and throbbing pain    10/29/2021 Patient states he is hurting wasn't able to take meloxicam do to his heart condition    11/12/2021 Patient states Saturday he started feeling some pain into the knee , once he rested he was able to apply pressure , tramadol has been helping    12/03/2021 Patient states    Relevant Historical Information: DM type II, hypertension, elevated BMI, PVD  Additional pertinent review of systems negative.   Current Outpatient Medications:    aspirin EC 81 MG tablet, Take 81 mg by mouth daily., Disp: , Rfl:    B-D ULTRAFINE III SHORT PEN 31G X 8 MM MISC, , Disp: , Rfl: 1   benzonatate (TESSALON) 200 MG capsule, Take 1 capsule (200 mg total) by mouth every 8 (eight) hours as needed for cough., Disp: 40 capsule, Rfl: 0   chlorhexidine (HIBICLENS) 4 % external liquid, Apply topically daily as needed., Disp: 120 mL, Rfl: 0   collagenase (SANTYL) ointment, Apply 1 application topically daily. Right ankle measures 6.0 x 0.3cm., Disp: 30 g, Rfl: 5   diclofenac Sodium (VOLTAREN) 1 % GEL, APPLY 2  GRAMS TOPICALLY 4 TIMES DAILY TO KNEE, Disp: 100 g, Rfl: 2   eplerenone (INSPRA) 25 MG tablet, TAKE 1 TABLET(25 MG) BY MOUTH DAILY, Disp: 90 tablet, Rfl: 1   famotidine (PEPCID) 20 MG tablet, TAKE 1 TABLET(20 MG) BY MOUTH DAILY, Disp: 90 tablet, Rfl: 1   fluticasone (FLONASE) 50 MCG/ACT nasal spray, Place 1 spray into both nostrils daily., Disp: 16 g, Rfl: 12   Fluticasone-Umeclidin-Vilant (TRELEGY ELLIPTA) 100-62.5-25 MCG/ACT AEPB, Inhale 1 puff into the lungs daily., Disp: 180 each, Rfl: 3   glucose blood (ONETOUCH ULTRA) test strip, TEST AS DIRECTED 8 TIMES PER DAY, Disp: 800 strip, Rfl: 0   HUMALOG 100 UNIT/ML injection, Inject into the skin., Disp: , Rfl:    HUMALOG KWIKPEN 200 UNIT/ML KwikPen, Inject into the skin., Disp: , Rfl:    HUMULIN R U-500 KWIKPEN 500 UNIT/ML kwikpen, Inject 50-60 Units into the skin See admin instructions. 60 units in the AM, 50 units at lunch and 50 in the evening, Disp: 3 mL, Rfl: 0   ipratropium-albuterol (DUONEB) 0.5-2.5 (3) MG/3ML SOLN, 1 vial in nebulizer up to 4 times daily, Disp: 90 mL, Rfl: 12   Lancets (ACCU-CHEK MULTICLIX) lancets, , Disp: , Rfl:    LANTUS SOLOSTAR 100 UNIT/ML Solostar Pen, Inject 55 Units into the skin every morning., Disp: , Rfl:  lidocaine (LIDODERM) 5 %, Place 1 patch onto the skin daily. Remove and Discard patch within 12 hours or as directed by MD, Disp: 15 patch, Rfl: 0   losartan (COZAAR) 50 MG tablet, TAKE 1/2 TABLET(25 MG) BY MOUTH DAILY, Disp: 45 tablet, Rfl: 1   MAGNESIUM-OXIDE 400 (241.3 Mg) MG tablet, Take 1 tablet by mouth 2 (two) times daily., Disp: , Rfl:    meloxicam (MOBIC) 15 MG tablet, Take 1 tablet (15 mg total) by mouth daily., Disp: 30 tablet, Rfl: 0   metoprolol tartrate (LOPRESSOR) 50 MG tablet, TAKE 1 TABLET(50 MG) BY MOUTH TWICE DAILY, Disp: 180 tablet, Rfl: 2   nitroGLYCERIN (NITROSTAT) 0.4 MG SL tablet, Place 1 tablet (0.4 mg total) under the tongue every 5 (five) minutes as needed for chest pain., Disp: 25  tablet, Rfl: 3   nystatin ointment (MYCOSTATIN), Apply 1 application topically 2 (two) times daily., Disp: 30 g, Rfl: 0   ofloxacin (OCUFLOX) 0.3 % ophthalmic solution, Place 1 drop into the right eye 4 (four) times daily., Disp: , Rfl:    polyethylene glycol (MIRALAX / GLYCOLAX) 17 g packet, Take 17 g by mouth daily., Disp: 14 each, Rfl: 0   potassium chloride SA (KLOR-CON) 20 MEQ tablet, TAKE 2 TABLETS(40 MEQ) BY MOUTH THREE TIMES DAILY, Disp: 540 tablet, Rfl: 0   prednisoLONE acetate (PRED FORTE) 1 % ophthalmic suspension, Place 1 drop into the right eye 4 (four) times daily., Disp: , Rfl:    pregabalin (LYRICA) 50 MG capsule, Take 1 capsule (50 mg total) by mouth at bedtime as needed (nerve pain)., Disp: 30 capsule, Rfl: 0   PROAIR HFA 108 (90 Base) MCG/ACT inhaler, Inhale 2 puffs into the lungs every 6 (six) hours as needed for wheezing or shortness of breath., Disp: 8.5 g, Rfl: 5   rosuvastatin (CRESTOR) 20 MG tablet, Take 1 tablet (20 mg total) by mouth every evening., Disp: 30 tablet, Rfl: 11   silver sulfADIAZINE (SSD) 1 % cream, APPLY PEA SIZED AMOUNT TOPICALLY TO WOUND DAILY, Disp: 50 g, Rfl: 0   torsemide (DEMADEX) 20 MG tablet, TAKE 1 TABLET(20 MG) BY MOUTH TWICE DAILY, Disp: 180 tablet, Rfl: 2   Objective:     There were no vitals filed for this visit.    There is no height or weight on file to calculate BMI.    Physical Exam:    ***   Electronically signed by:  [name] [name] D.[facility] 12:11 PM 12/02/21

## 2021-12-03 ENCOUNTER — Ambulatory Visit: Payer: Medicare Other | Admitting: Sports Medicine

## 2021-12-03 ENCOUNTER — Ambulatory Visit: Payer: Medicare Other | Admitting: Internal Medicine

## 2021-12-06 ENCOUNTER — Telehealth: Payer: Self-pay | Admitting: Family Medicine

## 2021-12-06 NOTE — Telephone Encounter (Signed)
Pt states: -he has a boil -he has had these before, but not for a while   Pt requests: -pcp team to send in an antibiotic to the pharmacy listed below.   Pt declined ov with any providers.  Preferred pharmacy: [facility] [phone] - [name], [place] - [place] AT [address] [place] [address] Phone: [phone]  Fax: [phone] DEA #: [id number]

## 2021-12-10 NOTE — Telephone Encounter (Signed)
Patient need OV 

## 2021-12-16 ENCOUNTER — Ambulatory Visit: Payer: Medicare Other | Admitting: Family Medicine

## 2021-12-18 DIAGNOSIS — Z8719 Personal history of other diseases of the digestive system: Secondary | ICD-10-CM | POA: Diagnosis not present

## 2021-12-18 DIAGNOSIS — Z794 Long term (current) use of insulin: Secondary | ICD-10-CM | POA: Diagnosis not present

## 2021-12-18 DIAGNOSIS — E1142 Type 2 diabetes mellitus with diabetic polyneuropathy: Secondary | ICD-10-CM | POA: Diagnosis not present

## 2021-12-18 DIAGNOSIS — E1165 Type 2 diabetes mellitus with hyperglycemia: Secondary | ICD-10-CM | POA: Diagnosis not present

## 2021-12-18 DIAGNOSIS — Z23 Encounter for immunization: Secondary | ICD-10-CM | POA: Diagnosis not present

## 2021-12-19 DIAGNOSIS — J45998 Other asthma: Secondary | ICD-10-CM | POA: Diagnosis not present

## 2022-01-09 ENCOUNTER — Telehealth: Payer: Self-pay | Admitting: Cardiology

## 2022-01-09 MED ORDER — POTASSIUM CHLORIDE CRYS ER 20 MEQ PO TBCR: EXTENDED_RELEASE_TABLET | ORAL | 0 refills | Status: DC

## 2022-01-09 NOTE — Telephone Encounter (Signed)
Pt's medication was sent to pt's pharmacy as requested. Confirmation received.  °

## 2022-01-09 NOTE — Telephone Encounter (Signed)
 *  STAT* If patient is at the pharmacy, call can be transferred to refill team.   1. Which medications need to be refilled? (please list name of each medication and dose if known)   potassium chloride SA (KLOR-CON) 20 MEQ tablet    2. Which pharmacy/location (including street and city if local pharmacy) is medication to be sent to?  [facility] [phone] - [place], [place] AT [address]    3. Do they need a 30 day or 90 day supply? 90 days

## 2022-01-10 ENCOUNTER — Ambulatory Visit (INDEPENDENT_AMBULATORY_CARE_PROVIDER_SITE_OTHER): Payer: Medicare Other

## 2022-01-10 ENCOUNTER — Ambulatory Visit (INDEPENDENT_AMBULATORY_CARE_PROVIDER_SITE_OTHER): Payer: Medicare Other | Admitting: Sports Medicine

## 2022-01-10 VITALS — HR 78 | Ht 71.0 in | Wt 301.0 lb

## 2022-01-10 DIAGNOSIS — M25572 Pain in left ankle and joints of left foot: Secondary | ICD-10-CM

## 2022-01-10 DIAGNOSIS — M25562 Pain in left knee: Secondary | ICD-10-CM

## 2022-01-10 DIAGNOSIS — M1712 Unilateral primary osteoarthritis, left knee: Secondary | ICD-10-CM

## 2022-01-10 DIAGNOSIS — G8929 Other chronic pain: Secondary | ICD-10-CM | POA: Diagnosis not present

## 2022-01-10 NOTE — Patient Instructions (Addendum)
Good to see you 3 week follow up  

## 2022-01-10 NOTE — Progress Notes (Signed)
[name] [name] D.[place] Phone: [phone]   Assessment and Plan:     1. Chronic pain of left ankle - Chronic, stable, subsequent visit - Continued left ankle pain, though overall improvement with HEP and Tylenol use.  Patient's primary concern today is his worsening left knee pain -Continue Tylenol 500 to 1000 mg tablets 2-3 times a day for day-to-day pain relief  3. Chronic pain of left knee 2. Primary osteoarthritis of left knee  -Chronic with exacerbation, subsequent visit - Worsening left knee pain consistent with flare of osteoarthritis based on HPI, physical exam, x-ray - X-ray obtained in clinic.  My interpretation: Severe cortical changes in medial and patellofemoral compartments with sclerosis, decreased space, cystic changes.  Chondrocalcinosis in the lateral compartment - Patient elected for intra-articular left knee CSI.  Tolerated well per note below.  Educated that [facility] may temporarily increase blood glucose in patient with past medical history of DM type II  Procedure: Knee Joint Injection Side: Left Indication: Flare of osteoarthritis  Risks explained and consent was given verbally. The site was cleaned with alcohol prep. A needle was introduced with an anterio-lateral approach. Injection given using 35m of 1% lidocaine without epinephrine and 141mof kenalog '40mg'$ /ml. This was well tolerated and resulted in symptomatic relief.  Needle was removed, hemostasis achieved, and post injection instructions were explained.   Pt was advised to call or return to clinic if these symptoms worsen or fail to improve as anticipated.   Pertinent previous records reviewed include none   Follow Up: 3 weeks to review knee pain and ankle pain.  Could consider alternative injections versus advanced imaging based on symptoms   Subjective:   I, [name] [name], am serving as a scEducation administratoror Doctor BeGlennon [name]  Chief Complaint: left ankle  pain and swelling   HPI:    10/09/21 Patient is a [age]ear old male complaining of left ankle pain and swelling. Patient states that he fell last week slipped , his foot got caught under the cabinet the swelling is under the ankle and the toe, hurts when he tries to walk. Has been taking tylenol and that does not seem to be helping at all, no locking clicking or popping, can move his toes as  well has an old fracture on his  ankle from along time ago, whole ankle joint feels stiff and throbbing pain    10/29/2021 Patient states he is hurting wasn't able to take meloxicam do to his heart condition    11/12/2021 Patient states Saturday he started feeling some pain into the knee , once he rested he was able to apply pressure , tramadol has been helping    01/10/2022 Patient states that he is alright the knee and the ankle are still in pain , ankle throbs and shoot up the leg , then that will cause the knee to hurt     Relevant Historical Information: DM type II, hypertension, elevated BMI, PVD  Additional pertinent review of systems negative.   Current Outpatient Medications:    aspirin EC 81 MG tablet, Take 81 mg by mouth daily., Disp: , Rfl:    B-D ULTRAFINE III SHORT PEN 31G X 8 MM MISC, , Disp: , Rfl: 1   benzonatate (TESSALON) 200 MG capsule, Take 1 capsule (200 mg total) by mouth every 8 (eight) hours as needed for cough., Disp: 40 capsule, Rfl: 0   chlorhexidine (HIBICLENS) 4 %  external liquid, Apply topically daily as needed., Disp: 120 mL, Rfl: 0   collagenase (SANTYL) ointment, Apply 1 application topically daily. Right ankle measures 6.0 x 0.3cm., Disp: 30 g, Rfl: 5   diclofenac Sodium (VOLTAREN) 1 % GEL, APPLY 2 GRAMS TOPICALLY 4 TIMES DAILY TO KNEE, Disp: 100 g, Rfl: 2   eplerenone (INSPRA) 25 MG tablet, TAKE 1 TABLET(25 MG) BY MOUTH DAILY, Disp: 90 tablet, Rfl: 1   famotidine (PEPCID) 20 MG tablet, TAKE 1 TABLET(20 MG) BY MOUTH DAILY, Disp: 90  tablet, Rfl: 1   fluticasone (FLONASE) 50 MCG/ACT nasal spray, Place 1 spray into both nostrils daily., Disp: 16 g, Rfl: 12   Fluticasone-Umeclidin-Vilant (TRELEGY ELLIPTA) 100-62.5-25 MCG/ACT AEPB, Inhale 1 puff into the lungs daily., Disp: 180 each, Rfl: 3   glucose blood (ONETOUCH ULTRA) test strip, TEST AS DIRECTED 8 TIMES PER DAY, Disp: 800 strip, Rfl: 0   HUMALOG 100 UNIT/ML injection, Inject into the skin., Disp: , Rfl:    HUMALOG KWIKPEN 200 UNIT/ML KwikPen, Inject into the skin., Disp: , Rfl:    HUMULIN R U-500 KWIKPEN 500 UNIT/ML kwikpen, Inject 50-60 Units into the skin See admin instructions. 60 units in the AM, 50 units at lunch and 50 in the evening, Disp: 3 mL, Rfl: 0   ipratropium-albuterol (DUONEB) 0.5-2.5 (3) MG/3ML SOLN, 1 vial in nebulizer up to 4 times daily, Disp: 90 mL, Rfl: 12   Lancets (ACCU-CHEK MULTICLIX) lancets, , Disp: , Rfl:    LANTUS SOLOSTAR 100 UNIT/ML Solostar Pen, Inject 55 Units into the skin every morning., Disp: , Rfl:    lidocaine (LIDODERM) 5 %, Place 1 patch onto the skin daily. Remove and Discard patch within 12 hours or as directed by MD, Disp: 15 patch, Rfl: 0   losartan (COZAAR) 50 MG tablet, TAKE 1/2 TABLET(25 MG) BY MOUTH DAILY, Disp: 45 tablet, Rfl: 1   MAGNESIUM-OXIDE 400 (241.3 Mg) MG tablet, Take 1 tablet by mouth 2 (two) times daily., Disp: , Rfl:    meloxicam (MOBIC) 15 MG tablet, Take 1 tablet (15 mg total) by mouth daily., Disp: 30 tablet, Rfl: 0   metoprolol tartrate (LOPRESSOR) 50 MG tablet, TAKE 1 TABLET(50 MG) BY MOUTH TWICE DAILY, Disp: 180 tablet, Rfl: 2   nitroGLYCERIN (NITROSTAT) 0.4 MG SL tablet, Place 1 tablet (0.4 mg total) under the tongue every 5 (five) minutes as needed for chest pain., Disp: 25 tablet, Rfl: 3   nystatin ointment (MYCOSTATIN), Apply 1 application topically 2 (two) times daily., Disp: 30 g, Rfl: 0   ofloxacin (OCUFLOX) 0.3 % ophthalmic solution, Place 1 drop into the right eye 4 (four) times daily., Disp: , Rfl:     polyethylene glycol (MIRALAX / GLYCOLAX) 17 g packet, Take 17 g by mouth daily., Disp: 14 each, Rfl: 0   potassium chloride SA (KLOR-CON M) 20 MEQ tablet, TAKE 2 TABLETS(40 MEQ) BY MOUTH THREE TIMES DAILY, Disp: 540 tablet, Rfl: 0   prednisoLONE acetate (PRED FORTE) 1 % ophthalmic suspension, Place 1 drop into the right eye 4 (four) times daily., Disp: , Rfl:    pregabalin (LYRICA) 50 MG capsule, Take 1 capsule (50 mg total) by mouth at bedtime as needed (nerve pain)., Disp: 30 capsule, Rfl: 0   PROAIR HFA 108 (90 Base) MCG/ACT inhaler, Inhale 2 puffs into the lungs every 6 (six) hours as needed for wheezing or shortness of breath., Disp: 8.5 g, Rfl: 5   rosuvastatin (CRESTOR) 20 MG tablet, Take 1 tablet (20 mg total) by  mouth every evening., Disp: 30 tablet, Rfl: 11   silver sulfADIAZINE (SSD) 1 % cream, APPLY PEA SIZED AMOUNT TOPICALLY TO WOUND DAILY, Disp: 50 g, Rfl: 0   torsemide (DEMADEX) 20 MG tablet, TAKE 1 TABLET(20 MG) BY MOUTH TWICE DAILY, Disp: 180 tablet, Rfl: 2   Objective:     Vitals:   01/10/22 1108  Pulse: 78  SpO2: 92%  Weight: (!) 301 lb (136.5 kg)  Height: '5\' 11"'$  (1.803 m)      Body mass index is 41.98 kg/m.    Physical Exam:    General:  awake, alert oriented, no acute distress nontoxic Skin: no suspicious lesions or rashes Neuro:sensation intact and strength 5/5 with no deficits, no atrophy, normal muscle tone Psych: No signs of anxiety, depression or other mood disorder  Left knee: Mild swelling No deformity Neg fluid wave, joint milking ROM Flex 90, Ext 10 TTP medial and lateral joint line NTTP over the quad tendon, medial fem condyle, lat fem condyle, patella, plica, patella tendon, tibial tuberostiy, fibular head, posterior fossa, pes anserine bursa, gerdy's tubercle,      Gait antalgic, favoring the right leg.  Using rollator for assistance with ambulation   Electronically signed by:  [name] [name] D.[facility] 11:53 AM  01/10/22

## 2022-01-13 NOTE — Progress Notes (Deleted)
Cardiology Office Note:    Date:  01/13/2022   ID:  [name] [name], DOB 03-16-1955, MRN [id number]  PCP:  [name], MD  [place] Providers Cardiologist:  Candee Furbish, MD { Click to update primary MD,subspecialty MD or APP then REFRESH:1}  *** Referring MD: [name], MD   Chief Complaint:  No chief complaint on file. {Click here for Visit Info    :1}   Patient Profile: Coronary artery disease  Hx of MI s/p CABG in 2005 Myoview 10/15: low risk  (HFpEF) heart failure with preserved ejection fraction  TTE 05/07/17: EF 50-55, mild MR, mild RVE, moderate RAE  Chronic kidney disease  Diabetes mellitus  Hypertension  Hyperlipidemia  Ectopic atrial tachycardia Morbid obesity  Venous insufficiency  Hx of pancreatitis  RBBB  Cirrhosis  Hx of ETOH abuse  Peripheral arterial disease (followed by [facility]) ABIs 08/06/18: R 0.85; L 0.88 OSA  Neuropathy       History of Present Illness:   [name] [name] is a 66 y.o. male with the above problem list.  He was last seen by Dr. Marlou Porch 05/09/21. He returns for f/u. ***  EKG:  {Desc; done/not:10129}     Reviewed and updated this encounter:***      ROS   Labs/Other Test Reviewed:    Recent Labs: 05/09/2021: ALT 13; Hemoglobin 14.4; Platelets 235 07/30/2021: BUN 17; Creatinine, Ser 1.47; Potassium 3.4; Sodium 133   Recent Lipid Panel Recent Labs    05/09/21 0957  CHOL 135  TRIG 138  HDL 49  LDLCALC 62      Risk Assessment/Calculations/Metrics:   {Does this patient have ATRIAL FIBRILLATION?:[phone]}     No BP recorded.  {Refresh Note OR Click here to enter BP  :1}***    Physical Exam:    VS:  There were no vitals taken for this visit.    Wt Readings from Last 3 Encounters:  01/10/22 (!) 301 lb (136.5 kg)  11/28/21 (!) 301 lb (136.5 kg)  11/12/21 (!) 301 lb (136.5 kg)    Physical Exam ***     ASSESSMENT & PLAN:   No problem-specific Assessment & Plan notes found for this encounter.    *** 1. Coronary artery disease of bypass graft of native heart with stable angina pectoris ([place]) History of myocardial infarction followed by CABG in 2005.  Myoview in 2015 was low risk.  He is doing well without anginal symptoms.  Continue aspirin 81 mg daily, simvastatin 40 mg daily, metoprolol tartrate 50 mg twice daily.  Follow-up in 6 months.   2. Chronic heart failure with preserved ejection fraction (HCC) EF 50-55 by echocardiogram in 3/19.  NYHA IIb.  Volume status overall appears to be stable.  He does have some increased lower extremity swelling.  His weight did go down when he had COVID and has now bounced back to his baseline.  His lungs are clear and his neck veins are flat.  I have asked him to take 1 extra dose of torsemide 20 mg today.  Otherwise continue torsemide 40 mg daily, eplerenone 25 mg daily.  Unfortunately, he cannot take SGLT2 inhibitors due to prior yeast infection.     3. Essential hypertension Blood pressure is well controlled.  Continue eplerenone 25 mg daily, losartan 25 mg daily, metoprolol tartrate 50 mg twice daily.   4. Mixed hyperlipidemia Recent LDL 75.  He notes he was off of his medication for a few days.  Continue simvastatin 40 mg  daily.  If his LDL remains above 70 on future lab draws, consider adding ezetimibe.   5. PVD (peripheral vascular disease) ([place]) He has stable left leg claudication.   6. Type 2 diabetes mellitus with other specified complication, with long-term current use of insulin ([place]) As noted, he cannot take SGLT2 inhibitors due to prior yeast infection.  His diabetes is managed by endocrinology, Dr. Buddy Duty.   7. Right leg swelling It is unusual for him to have worsening swelling in his right leg compared to his left.  Given his recent illness he was likely not as active.  I will arrange right lower extremity venous Doppler to rule out DVT. ***      {Are you ordering a CV Procedure (e.g. stress test, cath, DCCV, TEE, etc)?   Press F2         :[id number]}   Dispo:  No follow-ups on file.   Medication Adjustments/Labs and Tests Ordered: Current medicines are reviewed at length with the patient today.  Concerns regarding medicines are outlined above.  Tests Ordered: No orders of the defined types were placed in this encounter.  Medication Changes: No orders of the defined types were placed in this encounter.  Signed, [name], PA-C  01/13/2022 10:27 PM    [address], [place], [place]  [address] Phone: [phone]; Fax: [phone]

## 2022-01-14 ENCOUNTER — Ambulatory Visit: Payer: Medicare Other | Admitting: Physician Assistant

## 2022-01-14 DIAGNOSIS — I25708 Atherosclerosis of coronary artery bypass graft(s), unspecified, with other forms of angina pectoris: Secondary | ICD-10-CM

## 2022-01-14 DIAGNOSIS — I5032 Chronic diastolic (congestive) heart failure: Secondary | ICD-10-CM

## 2022-01-16 ENCOUNTER — Ambulatory Visit: Payer: Medicare Other | Admitting: Internal Medicine

## 2022-01-21 ENCOUNTER — Telehealth: Payer: Self-pay | Admitting: Pharmacist

## 2022-01-21 NOTE — Progress Notes (Signed)
Chronic Care Management Pharmacy Assistant   Name: [name] [name]  MRN: [id number] DOB: Jan 13, 1956   Reason for Encounter: Diabetes Adherence Call    Recent office visits:  None since last adherence call  Recent consult visits:  01/10/2022 OV (Sports Medicine) Glennon Mac, DO; no medication changes noted.  11/12/2021 OV (Sports Medicine) Glennon Mac, DO; no medication changes noted.  Hospital visits:  None in previous 6 months  Medications: Outpatient Encounter Medications as of 01/21/2022  Medication Sig   aspirin EC 81 MG tablet Take 81 mg by mouth daily.   B-D ULTRAFINE III SHORT PEN 31G X 8 MM MISC    benzonatate (TESSALON) 200 MG capsule Take 1 capsule (200 mg total) by mouth every 8 (eight) hours as needed for cough.   chlorhexidine (HIBICLENS) 4 % external liquid Apply topically daily as needed.   collagenase (SANTYL) ointment Apply 1 application topically daily. Right ankle measures 6.0 x 0.3cm.   diclofenac Sodium (VOLTAREN) 1 % GEL APPLY 2 GRAMS TOPICALLY 4 TIMES DAILY TO KNEE   eplerenone (INSPRA) 25 MG tablet TAKE 1 TABLET(25 MG) BY MOUTH DAILY   famotidine (PEPCID) 20 MG tablet TAKE 1 TABLET(20 MG) BY MOUTH DAILY   fluticasone (FLONASE) 50 MCG/ACT nasal spray Place 1 spray into both nostrils daily.   Fluticasone-Umeclidin-Vilant (TRELEGY ELLIPTA) 100-62.5-25 MCG/ACT AEPB Inhale 1 puff into the lungs daily.   glucose blood (ONETOUCH ULTRA) test strip TEST AS DIRECTED 8 TIMES PER DAY   HUMALOG 100 UNIT/ML injection Inject into the skin.   HUMALOG KWIKPEN 200 UNIT/ML KwikPen Inject into the skin.   HUMULIN R U-500 KWIKPEN 500 UNIT/ML kwikpen Inject 50-60 Units into the skin See admin instructions. 60 units in the AM, 50 units at lunch and 50 in the evening   ipratropium-albuterol (DUONEB) 0.5-2.5 (3) MG/3ML SOLN 1 vial in nebulizer up to 4 times daily   Lancets (ACCU-CHEK MULTICLIX) lancets    LANTUS SOLOSTAR 100 UNIT/ML Solostar Pen Inject 55 Units  into the skin every morning.   lidocaine (LIDODERM) 5 % Place 1 patch onto the skin daily. Remove and Discard patch within 12 hours or as directed by MD   losartan (COZAAR) 50 MG tablet TAKE 1/2 TABLET(25 MG) BY MOUTH DAILY   MAGNESIUM-OXIDE 400 (241.3 Mg) MG tablet Take 1 tablet by mouth 2 (two) times daily.   meloxicam (MOBIC) 15 MG tablet Take 1 tablet (15 mg total) by mouth daily.   metoprolol tartrate (LOPRESSOR) 50 MG tablet TAKE 1 TABLET(50 MG) BY MOUTH TWICE DAILY   nitroGLYCERIN (NITROSTAT) 0.4 MG SL tablet Place 1 tablet (0.4 mg total) under the tongue every 5 (five) minutes as needed for chest pain.   nystatin ointment (MYCOSTATIN) Apply 1 application topically 2 (two) times daily.   ofloxacin (OCUFLOX) 0.3 % ophthalmic solution Place 1 drop into the right eye 4 (four) times daily.   polyethylene glycol (MIRALAX / GLYCOLAX) 17 g packet Take 17 g by mouth daily.   potassium chloride SA (KLOR-CON M) 20 MEQ tablet TAKE 2 TABLETS(40 MEQ) BY MOUTH THREE TIMES DAILY   prednisoLONE acetate (PRED FORTE) 1 % ophthalmic suspension Place 1 drop into the right eye 4 (four) times daily.   pregabalin (LYRICA) 50 MG capsule Take 1 capsule (50 mg total) by mouth at bedtime as needed (nerve pain).   PROAIR HFA 108 (90 Base) MCG/ACT inhaler Inhale 2 puffs into the lungs every 6 (six) hours as needed for wheezing or shortness of breath.  rosuvastatin (CRESTOR) 20 MG tablet Take 1 tablet (20 mg total) by mouth every evening.   silver sulfADIAZINE (SSD) 1 % cream APPLY PEA SIZED AMOUNT TOPICALLY TO WOUND DAILY   torsemide (DEMADEX) 20 MG tablet TAKE 1 TABLET(20 MG) BY MOUTH TWICE DAILY   No facility-administered encounter medications on file as of 01/21/2022.   Recent Relevant Labs: Lab Results  Component Value Date/Time   HGBA1C 11.0 (H) 05/09/2021 09:57 AM   HGBA1C 10.6 02/02/2020 12:00 AM   HGBA1C 10.8 10/12/2019 12:00 AM   HGBA1C 10.8 10/12/2019 12:00 AM   MICROALBUR 22.48 03/24/2018 12:00 AM     Kidney Function Lab Results  Component Value Date/Time   CREATININE 1.47 07/30/2021 11:38 AM   CREATININE 1.48 05/27/2021 10:25 AM   CREATININE 1.01 03/20/2017 03:14 PM   CREATININE 1.23 03/24/2016 03:19 PM   GFR 49.04 (L) 07/30/2021 11:38 AM   GFRNONAA >60 09/02/2020 03:09 AM   GFRAA 65 12/12/2019 12:12 PM    Current antihyperglycemic regimen:  Humalog 100u/mL  sliding scale ~36 Lantus 60 units in the morning   What recent interventions/DTPs have been made to improve glycemic control:  No recent interventions or DTPs.  Have there been any recent hospitalizations or ED visits since last visit with CPP? No  Patient denies hypoglycemic symptoms.  Patient denies hyperglycemic symptoms.  How often are you checking your blood sugar? 3-4 times daily  What are your blood sugars ranging?  Fasting: 187  During the week, how often does your blood glucose drop below 70? Once a month  Are you checking your feet daily/regularly? Yes  Adherence Review: Is the patient currently on a STATIN medication? Yes Is the patient currently on ACE/ARB medication? Yes Does the patient have >5 day gap between last estimated fill dates? No  -Patient scheduled a follow up telephone call with pharmacist for 02/25/2021 at 11:45 am.  Care Gaps: Medicare Annual Wellness: Completed 11/15/2020 Ophthalmology Exam: Overdue since 03/13/2021 Foot Exam: Overdue since 11/02/2021 Hemoglobin A1C: 11.0% on 05/09/2021 Colonoscopy: Overdue - never done  Future Appointments  Date Time Provider [place]  01/31/2022 11:00 AM Glennon Mac, DO [facility] None  02/25/2022 11:45 AM [facility] CCM PHARMACIST [facility]  12/04/2022 10:00 AM [facility] HEALTH COACH [facility]   Star Rating Drugs: Humalog last filled 12/19/2021 92 DS Losartan 50 mg last filled 01/14/2022 90 DS Rosuvastatin 20 mg last filled 11/13/2021 90 DS  April D Calhoun, [place] Pharmacist Assistant [phone]

## 2022-01-23 ENCOUNTER — Encounter: Payer: Self-pay | Admitting: *Deleted

## 2022-01-30 ENCOUNTER — Other Ambulatory Visit: Payer: Self-pay | Admitting: Physician Assistant

## 2022-01-30 NOTE — Progress Notes (Deleted)
[name] [name] D.[place] Phone: [phone]   Assessment and Plan:     There are no diagnoses linked to this encounter.  ***   Pertinent previous records reviewed include ***   Follow Up: ***     Subjective:   I, [name] [name], am serving as a Education administrator for [name] [name]   Chief Complaint: left ankle  pain and swelling   HPI:    10/09/21 Patient is a 66 year old male complaining of left ankle pain and swelling. Patient states that he fell last week slipped , his foot got caught under the cabinet the swelling is under the ankle and the toe, hurts when he tries to walk. Has been taking tylenol and that does not seem to be helping at all, no locking clicking or popping, can move his toes as  well has an old fracture on his  ankle from along time ago, whole ankle joint feels stiff and throbbing pain    10/29/2021 Patient states he is hurting wasn't able to take meloxicam do to his heart condition    11/12/2021 Patient states Saturday he started feeling some pain into the knee , once he rested he was able to apply pressure , tramadol has been helping    01/10/2022 Patient states that he is alright the knee and the ankle are still in pain , ankle throbs and shoot up the leg , then that will cause the knee to hurt    01/31/2022 Patient states    Relevant Historical Information: DM type II, hypertension, elevated BMI, PVD  Additional pertinent review of systems negative.   Current Outpatient Medications:    aspirin EC 81 MG tablet, Take 81 mg by mouth daily., Disp: , Rfl:    B-D ULTRAFINE III SHORT PEN 31G X 8 MM MISC, , Disp: , Rfl: 1   benzonatate (TESSALON) 200 MG capsule, Take 1 capsule (200 mg total) by mouth every 8 (eight) hours as needed for cough., Disp: 40 capsule, Rfl: 0   chlorhexidine (HIBICLENS) 4 % external liquid, Apply topically daily as needed., Disp: 120 mL, Rfl: 0    collagenase (SANTYL) ointment, Apply 1 application topically daily. Right ankle measures 6.0 x 0.3cm., Disp: 30 g, Rfl: 5   diclofenac Sodium (VOLTAREN) 1 % GEL, APPLY 2 GRAMS TOPICALLY 4 TIMES DAILY TO KNEE, Disp: 100 g, Rfl: 2   eplerenone (INSPRA) 25 MG tablet, TAKE 1 TABLET(25 MG) BY MOUTH DAILY, Disp: 90 tablet, Rfl: 1   famotidine (PEPCID) 20 MG tablet, TAKE 1 TABLET(20 MG) BY MOUTH DAILY, Disp: 90 tablet, Rfl: 1   fluticasone (FLONASE) 50 MCG/ACT nasal spray, Place 1 spray into both nostrils daily., Disp: 16 g, Rfl: 12   Fluticasone-Umeclidin-Vilant (TRELEGY ELLIPTA) 100-62.5-25 MCG/ACT AEPB, Inhale 1 puff into the lungs daily., Disp: 180 each, Rfl: 3   glucose blood (ONETOUCH ULTRA) test strip, TEST AS DIRECTED 8 TIMES PER DAY, Disp: 800 strip, Rfl: 0   HUMALOG 100 UNIT/ML injection, Inject into the skin., Disp: , Rfl:    HUMALOG KWIKPEN 200 UNIT/ML KwikPen, Inject into the skin., Disp: , Rfl:    HUMULIN R U-500 KWIKPEN 500 UNIT/ML kwikpen, Inject 50-60 Units into the skin See admin instructions. 60 units in the AM, 50 units at lunch and 50 in the evening, Disp: 3 mL, Rfl: 0   ipratropium-albuterol (DUONEB) 0.5-2.5 (3) MG/3ML SOLN, 1 vial in nebulizer up to 4 times daily, Disp:  90 mL, Rfl: 12   Lancets (ACCU-CHEK MULTICLIX) lancets, , Disp: , Rfl:    LANTUS SOLOSTAR 100 UNIT/ML Solostar Pen, Inject 55 Units into the skin every morning., Disp: , Rfl:    lidocaine (LIDODERM) 5 %, Place 1 patch onto the skin daily. Remove and Discard patch within 12 hours or as directed by MD, Disp: 15 patch, Rfl: 0   losartan (COZAAR) 50 MG tablet, TAKE 1/2 TABLET(25 MG) BY MOUTH DAILY, Disp: 45 tablet, Rfl: 1   MAGNESIUM-OXIDE 400 (241.3 Mg) MG tablet, Take 1 tablet by mouth 2 (two) times daily., Disp: , Rfl:    meloxicam (MOBIC) 15 MG tablet, Take 1 tablet (15 mg total) by mouth daily., Disp: 30 tablet, Rfl: 0   metoprolol tartrate (LOPRESSOR) 50 MG tablet, TAKE 1 TABLET(50 MG) BY MOUTH TWICE DAILY, Disp:  180 tablet, Rfl: 2   nitroGLYCERIN (NITROSTAT) 0.4 MG SL tablet, Place 1 tablet (0.4 mg total) under the tongue every 5 (five) minutes as needed for chest pain., Disp: 25 tablet, Rfl: 3   nystatin ointment (MYCOSTATIN), Apply 1 application topically 2 (two) times daily., Disp: 30 g, Rfl: 0   ofloxacin (OCUFLOX) 0.3 % ophthalmic solution, Place 1 drop into the right eye 4 (four) times daily., Disp: , Rfl:    polyethylene glycol (MIRALAX / GLYCOLAX) 17 g packet, Take 17 g by mouth daily., Disp: 14 each, Rfl: 0   potassium chloride SA (KLOR-CON M) 20 MEQ tablet, TAKE 2 TABLETS(40 MEQ) BY MOUTH THREE TIMES DAILY, Disp: 540 tablet, Rfl: 0   prednisoLONE acetate (PRED FORTE) 1 % ophthalmic suspension, Place 1 drop into the right eye 4 (four) times daily., Disp: , Rfl:    pregabalin (LYRICA) 50 MG capsule, Take 1 capsule (50 mg total) by mouth at bedtime as needed (nerve pain)., Disp: 30 capsule, Rfl: 0   PROAIR HFA 108 (90 Base) MCG/ACT inhaler, Inhale 2 puffs into the lungs every 6 (six) hours as needed for wheezing or shortness of breath., Disp: 8.5 g, Rfl: 5   rosuvastatin (CRESTOR) 20 MG tablet, Take 1 tablet (20 mg total) by mouth every evening., Disp: 30 tablet, Rfl: 11   silver sulfADIAZINE (SSD) 1 % cream, APPLY PEA SIZED AMOUNT TOPICALLY TO WOUND DAILY, Disp: 50 g, Rfl: 0   torsemide (DEMADEX) 20 MG tablet, TAKE 1 TABLET(20 MG) BY MOUTH TWICE DAILY, Disp: 180 tablet, Rfl: 2   Objective:     There were no vitals filed for this visit.    There is no height or weight on file to calculate BMI.    Physical Exam:    ***   Electronically signed by:  [name] [name] D.[facility] 12:27 PM 01/30/22

## 2022-01-31 ENCOUNTER — Ambulatory Visit: Payer: Medicare Other | Admitting: Sports Medicine

## 2022-02-20 NOTE — Progress Notes (Unsigned)
Care Management & Coordination Services Pharmacy Note  02/20/2022 Name:  [name] [name] MRN:  [id number] DOB:  1955-02-25  Summary: ***  Recommendations/Changes made from today's visit: ***  Follow up plan: ***   Subjective: [name] [name] is an 67 y.o. year old male who is a primary patient of [name] [name], [name] Greenhouse, [name].  The care coordination team was consulted for assistance with disease management and care coordination [name].    Engaged with patient by telephone for follow up visit.  Patient Care Team: [name] Barrack, [name] as PCP - General (Family Medicine) [name] [name] as PCP - Cardiology (Cardiology) [name] Rend, [name] as Consulting Physician (Endocrinology) [name] [name], DPM as Consulting Physician (Podiatry) [name] Lever, [name] as Consulting Physician (Pulmonary Disease) [name] [name], [name] Leyden, NP (Inactive) as Nurse Practitioner (Vascular Surgery) [name] as Consulting Physician [name] [name], [facility] as Pharmacist (Pharmacist)  Recent office visits:  None since last adherence call   Recent consult visits:  01/10/2022 OV (Sports Medicine) [name] Mac, DO; no medication changes noted.   11/12/2021 OV (Sports Medicine) [name] Mac, DO; no medication changes noted.   Hospital visits:  None in previous 6 months   Objective:  Lab Results  Component Value Date   CREATININE 1.47 07/30/2021   BUN 17 07/30/2021   GFR 49.04 (L) 07/30/2021   EGFR 58 (L) 05/09/2021   GFRNONAA >60 09/02/2020   GFRAA 65 12/12/2019   NA 133 (L) 07/30/2021   K 3.4 (L) 07/30/2021   CALCIUM 9.3 07/30/2021   CO2 27 07/30/2021   GLUCOSE 281 (H) 07/30/2021    Lab Results  Component Value Date/Time   HGBA1C 11.0 (H) 05/09/2021 09:57 AM   HGBA1C 10.6 02/02/2020 12:00 AM   HGBA1C 10.8 10/12/2019 12:00 AM   HGBA1C 10.8 10/12/2019 12:00 AM   GFR 49.04 (L) 07/30/2021 11:38 AM   GFR 48.70 (L) 05/27/2021 10:25 AM   MICROALBUR 22.48 03/24/2018 12:00 AM     Last diabetic Eye exam: No results found for: "HMDIABEYEEXA"  Last diabetic Foot exam: No results found for: "HMDIABFOOTEX"   Lab Results  Component Value Date   CHOL 135 05/09/2021   HDL 49 05/09/2021   LDLCALC 62 05/09/2021   LDLDIRECT 71.0 10/07/2018   TRIG 138 05/09/2021   CHOLHDL 2.8 05/09/2021       Latest Ref Rng & Units 05/09/2021    9:57 AM 09/02/2019    4:45 AM 06/03/2019   12:45 PM  Hepatic Function  Total Protein 6.0 - 8.5 g/dL 7.5  7.7  8.3   Albumin 3.8 - 4.8 g/dL 3.8  3.3  3.8   AST 0 - 40 IU/L '15  17  14   '$ ALT 0 - 44 IU/L '13  13  9   '$ Alk Phosphatase 44 - 121 IU/L 96  63  90   Total Bilirubin 0.0 - 1.2 mg/dL 1.0  1.0  0.5     Lab Results  Component Value Date/Time   TSH 3.06 10/07/2018 02:47 PM   TSH 2.99 03/23/2018 12:00 AM   TSH 2.99 03/23/2018 12:00 AM   TSH 1.71 03/20/2017 03:14 PM       Latest Ref Rng & Units 05/09/2021    9:57 AM 09/02/2020    3:09 AM 09/03/2019    6:07 AM  CBC  WBC 3.4 - 10.8 x10E3/uL 6.8  5.0  7.6   Hemoglobin 13.0 - 17.7 g/dL 14.4  14.8  14.6   Hematocrit 37.5 -  51.0 % 40.0  43.9  44.9   Platelets 150 - 450 x10E3/uL 235  169  189     No results found for: "VD25OH", "VITAMINB12"  Clinical ASCVD: {YES/NO:21197} The 10-year ASCVD risk score (Arnett DK, et al., 2019) is: 29.8%   Values used to calculate the score:     Age: 67 years     Sex: Male     Is Non-Hispanic African American: Yes     Diabetic: Yes     Tobacco smoker: No     Systolic Blood Pressure: 315 mmHg     Is BP treated: Yes     HDL Cholesterol: 49 mg/dL     Total Cholesterol: 135 mg/dL       11/28/2021   10:03 AM 11/15/2020    9:42 AM 11/10/2019    9:44 AM  Depression screen PHQ 2/9  Decreased Interest 0 0 0  Down, Depressed, Hopeless 0 0 0  PHQ - 2 Score 0 0 0     ***Other: (CHADS2VASc if Afib, MMRC or CAT for COPD, ACT, DEXA)  Social History   Tobacco Use  Smoking Status Former   Packs/day: 1.50   Years: 30.00   Total pack years: 45.00    Types: Cigarettes   Quit date: 10/20/2004   Years since quitting: 17.3  Smokeless Tobacco Never   BP Readings from Last 3 Encounters:  11/12/21 132/78  10/09/21 138/70  08/27/21 102/60   Pulse Readings from Last 3 Encounters:  01/10/22 78  11/12/21 78  10/29/21 82   Wt Readings from Last 3 Encounters:  01/10/22 (!) 301 lb (136.5 kg)  11/28/21 (!) 301 lb (136.5 kg)  11/12/21 (!) 301 lb (136.5 kg)   BMI Readings from Last 3 Encounters:  01/10/22 41.98 kg/m  11/28/21 41.98 kg/m  11/12/21 41.98 kg/m    Allergies  Allergen Reactions   Empagliflozin Rash    Patient reports yeast.    Ibuprofen Other (See Comments)    Stomach [name]    Medications Reviewed Today     Reviewed by [name] Brace, LPN (Licensed Practical Nurse) on 11/28/21 at 64  Med List Status: <None>   Medication Order Taking? Sig Documenting Provider Last Dose Status Informant  aspirin EC 81 MG tablet [id number] Yes Take 81 mg by mouth daily. Provider, Historical, [name] Taking Active Self  B-[name] ULTRAFINE III SHORT PEN 31G X 8 MM MISC [id number] Yes  Provider, Historical, [name] Taking Active Self  benzonatate (TESSALON) 200 MG capsule [id number] Yes Take 1 capsule (200 mg total) by mouth every 8 (eight) hours as needed for cough. [name] Lyons [name], [name] Taking Active   chlorhexidine (HIBICLENS) 4 % external liquid [id number] Yes Apply topically daily as needed. [name] Barrack, [name] Taking Active   collagenase [name] [name]) ointment [id number] Yes Apply 1 application topically daily. Right ankle measures 6.0 x 0.3cm. [name] [name], DPM Taking Active Self  diclofenac Sodium (VOLTAREN) 1 % GEL [id number] Yes APPLY 2 GRAMS TOPICALLY 4 TIMES DAILY TO KNEE [name] Barrack, [name] Taking Active   eplerenone (INSPRA) 25 MG tablet [id number] Yes TAKE 1 TABLET(25 MG) BY MOUTH DAILY [name] [name] Taking Active   famotidine (PEPCID) 20 MG tablet [id number] Yes TAKE 1 TABLET(20 MG) BY MOUTH DAILY [name] [name] Taking Active    fluticasone (FLONASE) 50 MCG/ACT nasal spray [id number] Yes Place 1 spray into both nostrils daily. [name] Barrack, [name] Taking Active   Fluticasone-Umeclidin-[facility] ELLIPTA) 100-62.5-25 MCG/ACT AEPB [id number] Yes  Inhale 1 puff into the lungs daily. [name] Lyons [name], [name] Taking Active   glucose blood [facility] ULTRA) test strip [id number] Yes TEST AS DIRECTED 8 TIMES PER DAY Allwardt, Alyssa M, PA-C Taking Active   HUMALOG 100 UNIT/ML injection [id number] Yes Inject into the skin. Provider, Historical, [name] Taking Active   HUMALOG KWIKPEN 200 UNIT/ML KwikPen [id number] Yes Inject into the skin. Provider, Historical, [name] Taking Active   HUMULIN R U-500 KWIKPEN 500 UNIT/ML Claiborne Rigg [id number] Yes Inject 50-60 Units into the skin See admin instructions. 60 units in the AM, 50 units at lunch and 50 in the evening [name] Barrack, [name] Taking Active   ipratropium-albuterol (DUONEB) 0.5-2.5 (3) MG/3ML Bailey Mech [id number] Yes 1 vial in nebulizer up to 4 times daily [name] Lever, [name] Taking Active   Lancets (ACCU-CHEK MULTICLIX) lancets [id number] Yes  Provider, Historical, [name] Taking Active Self  LANTUS SOLOSTAR 100 UNIT/ML Solostar Pen [id number] Yes Inject 55 Units into the skin every morning. Provider, Historical, [name] Taking Active   lidocaine (LIDODERM) 5 % [id number] Yes Place 1 patch onto the skin daily. Remove and Discard patch within 12 hours or as directed by [name] Clayton Bibles, NP Taking Active   losartan (COZAAR) 50 MG tablet [id number] Yes TAKE 1/2 TABLET(25 MG) BY MOUTH DAILY [name] [name] Taking Active   MAGNESIUM-OXIDE 400 (241.3 Mg) MG tablet [id number] Yes Take 1 tablet by mouth 2 (two) times daily. Provider, Historical, [name] Taking Active   meloxicam (MOBIC) 15 MG tablet [id number] Yes Take 1 tablet (15 mg total) by mouth daily. [name] Mac, DO Taking Active   metoprolol tartrate (LOPRESSOR) 50 MG tablet [id number] Yes TAKE 1 TABLET(50 MG) BY MOUTH TWICE DAILY [name] [name] Taking Active    nitroGLYCERIN (NITROSTAT) 0.4 MG SL tablet [id number] Yes Place 1 tablet (0.4 mg total) under the tongue every 5 (five) minutes as needed for chest [name]. [name] [name] Taking Active   nystatin ointment (MYCOSTATIN) [id number] Yes Apply 1 application topically 2 (two) times daily. [name] Barrack, [name] Taking Active   ofloxacin (OCUFLOX) 0.3 % ophthalmic solution [id number] Yes Place 1 drop into the right eye 4 (four) times daily. Provider, Historical, [name] Taking Active   polyethylene glycol (MIRALAX / GLYCOLAX) 17 g packet [id number] Yes Take 17 g by mouth daily. Bonnielee Haff, [name] Taking Active   potassium chloride SA (KLOR-CON) 20 MEQ tablet [id number] Yes TAKE 2 TABLETS(40 MEQ) BY MOUTH THREE TIMES DAILY [name] [name] Taking Active   prednisoLONE acetate (PRED FORTE) 1 % ophthalmic suspension [id number] Yes Place 1 drop into the right eye 4 (four) times daily. Provider, Historical, [name] Taking Active   pregabalin (LYRICA) 50 MG capsule [id number] Yes Take 1 capsule (50 mg total) by mouth at bedtime as needed (nerve [name]). Gregor Hams, [name] Taking Active   [facility] HFA [phone] Base) MCG/ACT inhaler [id number] Yes Inhale 2 puffs into the lungs every 6 (six) hours as needed for wheezing or shortness of breath. Clayton Bibles, NP Taking Active   rosuvastatin (CRESTOR) 20 MG tablet [id number] Yes Take 1 tablet (20 mg total) by mouth every evening. 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 Buddy Duty 10/14/2018   Chronic heart failure with preserved ejection fraction ([place]) 08/29/2017   Ectopic atrial rhythm 05/10/2017   Varicose vein of leg 04/13/2017   Polyneuropathy associated with underlying disease ([place])    Osteoarthritis 01/21/2017   Seasonal and perennial allergic rhinitis 07/17/2016   Ulnar neuritis, left 03/27/2016   Osteoarthritis of spine with radiculopathy, cervical region 03/27/2016   PVD (peripheral vascular disease) ([place]) 04/30/2009   Morbid obesity ([place])    HLD (hyperlipidemia) 01/04/2007   Hepatic cirrhosis ([place]) 01/04/2007   Obstructive sleep apnea 01/04/2007   HTN (hypertension) 01/04/2007    Immunization History  Administered Date(s) Administered   Influenza Split 01/29/2009, 11/19/2009, 11/11/2010, 10/23/2011, 11/10/2012, 01/31/2013, 01/24/2014, 10/20/2014   Influenza, High Dose Seasonal PF 11/02/2020   Influenza,inj,Quad PF,6+ Mos 11/10/2013, 11/30/2015, 12/10/2016   Influenza-Unspecified 11/30/2015, 11/01/2020   PFIZER(Purple Top)SARS-COV-2 Vaccination 05/17/2019, 06/06/2019   Pneumococcal Polysaccharide-23 06/26/2008   Tdap 09/01/2019     Compliance/Adherence/Medication fill history: Care Gaps: ***  Star-Rating Drugs: ***  SDOH:  (Social Determinants of Health) assessments and interventions performed: {yes/no:20286} SDOH Interventions    Flowsheet Row Clinical Support from 11/28/2021 in [place] Interventions Intervention Not Indicated  Housing Interventions Intervention Not Indicated  Transportation Interventions Intervention Not Indicated  Financial Strain Interventions Intervention  Not Indicated  Physical Activity Interventions Intervention Not Indicated  Stress Interventions Intervention Not Indicated  Social Connections Interventions Intervention Not Indicated      SDOH Screenings   Food Insecurity: No Food Insecurity (11/28/2021)  Housing: Low Risk  (11/28/2021)  Transportation [name]: No Transportation [name] (11/28/2021)  Depression (PHQ2-9): Low Risk  (11/28/2021)  Financial Resource Strain: Low Risk  (11/28/2021)  Physical Activity: Sufficiently Active (11/28/2021)  Social Connections: Socially Isolated (11/28/2021)  Stress: No Stress Concern Present (11/28/2021)  Tobacco Use: Medium Risk (11/28/2021)    Medication Assistance: {MEDASSISTANCEINFO:25044}  Medication Access: Within the past 30 days, how often has patient missed a dose of medication? *** Is a pillbox or other method used to improve adherence? {YES/NO:21197} Factors that may affect medication adherence? {CHL DESC; BARRIERS:21522} Are meds synced by current pharmacy? {YES/NO:21197} Are meds delivered by current pharmacy? {YES/NO:21197} Does patient experience delays in picking up medications due to transportation concerns? {YES/NO:21197}  [facility] Reviewed: Is patient disadvantaged to use [facility]?: {YES/NO:21197} Current Rx insurance plan: *** Name and location of Current pharmacy:  [facility] [place], [place] [address] [place] [address] Phone: [phone] Fax: [phone]  [place], [place] [address] [place] [address] Phone: [phone] Fax: [phone]  [facility] [place], [place] [address] Phone: [phone] Fax: [phone]  [id number]  [place], [place] [address] [place] [address] Phone: [phone] Fax: [phone]  [facility] services reviewed with patient today?: {YES/NO:21197} Patient requests to transfer care to [facility]?: {YES/NO:21197} Reason patient declined to change pharmacies: {US patient preference:27474}   Assessment/Plan     Hypertension (BP goal <130/80) -Controlled, not discussed today -Current treatment: Losartan '25mg'$  Appropriate, Effective, Safe, Accessible Metoprolol tartrate '50mg'$  BID Appropriate, Effective, Safe, Accessible -Medications previously tried: none noted  -Current home readings: "normal" -Current dietary habits: see DM -Current exercise habits: walking some at grocery store -Denies hypotensive/hypertensive symptoms -Educated on BP goals and benefits of medications for prevention of heart attack, stroke and kidney damage; Exercise goal of 150 minutes per week; Importance of home blood pressure monitoring; Symptoms of hypotension and importance of maintaining adequate hydration; -Counseled to monitor BP at home a few times per week, document, and provide log at future appointments -Recommended to continue current medication  Hyperlipidemia/CAD: (LDL goal < 70) -Controlled, based on recent LDL March 2023 -Current treatment: Rosuvastatin '20mg'$  daily Appropriate, Effective, Safe, Accessible -Medications previously tried: simvastatin  -Current dietary patterns: see DM -Educated on Cholesterol goals;  Benefits of statin for ASCVD risk reduction; Importance of limiting foods high in cholesterol; -Recommended to continue current medication Most recent LDL is well controlled - no adverse effects.  Reports adherence.  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%) 08/12/21 -Uncontrolled, still seeing [name] [name] (Endo) -Current medications: Humulin U-500 60-50-50 tid with meals Appropriate, Query effective, Lantus 60 units once daily Appropriate, Query  effective,  -Medications previously tried: Jardiance (yeast)  -Current home glucose readings fasting glucose: 190-240 post prandial glucose: 200-300s -Denies hypoglycemic/hyperglycemic symptoms -Current exercise: minimal, does walk some around grocery store -Educated on A1c and blood sugar goals; Complications of diabetes including kidney damage, retinal damage, and cardiovascular disease; Prevention and management of hypoglycemic episodes; Benefits of routine self-monitoring of blood sugar; Dietary education regarding carbs, sugars -Counseled to check feet daily and get yearly eye exams -He has cut back on his soda intake, now drinking one liter bottles and maybe only drinking a half per day.  Still working on cutting back completely. Has upcoming visit with Endocrine for A1c recheck.  Considerable time spent on dietary discusstion today. He is also taking a short course of steroids. Recommend he continue to work on diet - also really need to implement exercise. Continue current medication until A1c recheck.   Initial Visit - March 2023 I believe his intake of sodas alone is a large part of elevated glucose.  He is working on switching to the zero sugar sodas.  Also discussed carbs in other dietary sources.  He was concerned that his sugar would drop to low.  Explained that we could decrease insulin if sugar started dropping too low. Agreeable to work on sodas, will follow closely.  Recommend recheck A1c in 3 months. -Current meal patterns:  breakfast: eggs, grits, sausage links  lunch: salad, [place] sandwich  dinner: baked chicken, rice w/ broccoli drinks: sodas (4-6 12oz per day), water  Heart Failure (Goal: manage symptoms and prevent exacerbations) -Controlled, not assessed today -Last ejection fraction: 50-55% -Current treatment: Losartan '25mg'$  Appropriate, Effective, Safe, Accessible Eplerenone '25mg'$  Appropriate, Effective, Safe, Accessible Metoprolol tartrate '50mg'$  BID  Appropriate, Effective, Safe, Accessible Torsemide '20mg'$  Appropriate, Effective, Safe, Accessible -Medications previously tried: none noted  -Current home BP/HR readings: "normal" -Current dietary habits: working on processed foods and salt intake -Educated on Benefits  of medications for managing symptoms and prolonging life Importance of weighing daily; if you gain more than 3 pounds in one day or 5 pounds in one week, contact providers -Recommended to continue current medication Monitor weights and SOB for fluid retention.  Patient Goals/Self-Care Activities Patient will:  - take medications as prescribed as evidenced by patient report and record review check glucose daily, document, and provide at future appointments engage in dietary modifications by decreasing regular soda intake  Follow Up Plan: The care management team will reach out to the patient again over the next 90 days.          Beverly Milch, PharmD Clinical Pharmacist  [facility] [phone]

## 2022-02-21 ENCOUNTER — Telehealth: Payer: Self-pay | Admitting: Pharmacist

## 2022-02-21 NOTE — Progress Notes (Signed)
Care Management & Coordination Services Pharmacy Team  Reason for Encounter: Appointment Reminder  Patient contacted to confirm telephone appointment with Leata Mouse PharmD, on 02/25/2022 at 3 pm.  I spoke with a family member of the patient, she stated she will remind the patient of the upcoming appointment.  April D Calhoun, [place] Pharmacist Assistant [phone]

## 2022-02-25 ENCOUNTER — Encounter: Payer: Medicare Other | Admitting: Pharmacist

## 2022-02-27 DIAGNOSIS — E1051 Type 1 diabetes mellitus with diabetic peripheral angiopathy without gangrene: Secondary | ICD-10-CM | POA: Diagnosis not present

## 2022-02-27 DIAGNOSIS — E1142 Type 2 diabetes mellitus with diabetic polyneuropathy: Secondary | ICD-10-CM | POA: Diagnosis not present

## 2022-02-27 DIAGNOSIS — L84 Corns and callosities: Secondary | ICD-10-CM | POA: Diagnosis not present

## 2022-02-27 DIAGNOSIS — L603 Nail dystrophy: Secondary | ICD-10-CM | POA: Diagnosis not present

## 2022-02-27 DIAGNOSIS — I739 Peripheral vascular disease, unspecified: Secondary | ICD-10-CM | POA: Diagnosis not present

## 2022-03-07 ENCOUNTER — Telehealth: Payer: Self-pay | Admitting: Pharmacist

## 2022-03-07 ENCOUNTER — Other Ambulatory Visit: Payer: Self-pay | Admitting: Sports Medicine

## 2022-03-07 ENCOUNTER — Telehealth: Payer: Self-pay | Admitting: Sports Medicine

## 2022-03-07 DIAGNOSIS — M79643 Pain in unspecified hand: Secondary | ICD-10-CM

## 2022-03-07 MED ORDER — TRAMADOL HCL 50 MG PO TABS: 50.0000 mg | ORAL_TABLET | Freq: Three times a day (TID) | ORAL | 0 refills | Status: AC | PRN

## 2022-03-07 NOTE — Progress Notes (Signed)
I have provided a 5-day supply refill of tramadol.  I have never seen patient for hand pain, so patient will need to follow-up in clinic before any further treatment would be provided.

## 2022-03-07 NOTE — Telephone Encounter (Signed)
Pt is requesting a refill of tramadol for his hand pain he is in a lot of pain

## 2022-03-07 NOTE — Telephone Encounter (Signed)
Patient called stating that he has been having on going hand pain even when while taking Tylenol. He asked to speak to [facility] for other options.  Call transferred.

## 2022-03-07 NOTE — Progress Notes (Signed)
Care Management & Coordination Services Pharmacy Team  Reason for Encounter: Appointment Reminder  Contacted patient to confirm telephone appointment with Leata Mouse PharmD, on 03/10/2022 at 2 pm. Spoke with patient on 03/07/2022     Star Rating Drugs:  Lantus last filled 03/04/2022 90 DS Losartan 50 mg last filled 01/14/2022 90 DS Rosuvastatin 20 mg last filled 02/15/2022 90 DS   Care Gaps: Annual wellness visit in last year? Yes  If Diabetic: Last eye exam / retinopathy screening: 03/13/2020 Last diabetic foot exam: 11/02/2020   April D Calhoun, [place] Pharmacist Assistant [phone]

## 2022-03-10 ENCOUNTER — Ambulatory Visit: Payer: Medicare Other | Admitting: Pharmacist

## 2022-03-10 NOTE — Progress Notes (Signed)
Care Management & Coordination Services Pharmacy Note  03/10/2022 Name:  [name] [name] MRN:  [id number] DOB:  01-Feb-1956  Summary: PharmD FU visit.  Patient overdue for visit to PCP for updated labs.  Due for A1c and diabetic kidney eval.  Unable to access from Dr. Cindra Eves office.  Reports fluctuating FBG from 54-208.  Continues to work on [employer].  At this time recommended he comes in for FU - mentions wanting to switch to closer PCP, he is going to call closer office to see if they are accepting new patients.  Recommendations/Changes made from today's visit: No changes, continue to work on dietary mods  Follow up plan: FU 3 months CMA DM 1 month   Subjective: [name] [name] is an 67 y.o. year old male who is a primary patient of [name], Algis Greenhouse, MD.  The care coordination team was consulted for assistance with disease management and care coordination needs.    Engaged with patient by telephone for follow up visit.  Recent office visits:  None since last adherence call   Recent consult visits:  01/10/2022 OV (Sports Medicine) Glennon Mac, DO; no medication changes noted.   11/12/2021 OV (Sports Medicine) Glennon Mac, DO; no medication changes noted.   Hospital visits:  None in previous 6 months   Objective:  Lab Results  Component Value Date   CREATININE 1.47 07/30/2021   BUN 17 07/30/2021   GFR 49.04 (L) 07/30/2021   EGFR 58 (L) 05/09/2021   GFRNONAA >60 09/02/2020   GFRAA 65 12/12/2019   NA 133 (L) 07/30/2021   K 3.4 (L) 07/30/2021   CALCIUM 9.3 07/30/2021   CO2 27 07/30/2021   GLUCOSE 281 (H) 07/30/2021    Lab Results  Component Value Date/Time   HGBA1C 11.0 (H) 05/09/2021 09:57 AM   HGBA1C 10.6 02/02/2020 12:00 AM   HGBA1C 10.8 10/12/2019 12:00 AM   HGBA1C 10.8 10/12/2019 12:00 AM   GFR 49.04 (L) 07/30/2021 11:38 AM   GFR 48.70 (L) 05/27/2021 10:25 AM   MICROALBUR 22.48 03/24/2018 12:00 AM    Last diabetic Eye exam: No results found  for: "HMDIABEYEEXA"  Last diabetic Foot exam: No results found for: "HMDIABFOOTEX"   Lab Results  Component Value Date   CHOL 135 05/09/2021   HDL 49 05/09/2021   LDLCALC 62 05/09/2021   LDLDIRECT 71.0 10/07/2018   TRIG 138 05/09/2021   CHOLHDL 2.8 05/09/2021       Latest Ref Rng & Units 05/09/2021    9:57 AM 09/02/2019    4:45 AM 06/03/2019   12:45 PM  Hepatic Function  Total Protein 6.0 - 8.5 g/dL 7.5  7.7  8.3   Albumin 3.8 - 4.8 g/dL 3.8  3.3  3.8   AST 0 - 40 IU/L '15  17  14   '$ ALT 0 - 44 IU/L '13  13  9   '$ Alk Phosphatase 44 - 121 IU/L 96  63  90   Total Bilirubin 0.0 - 1.2 mg/dL 1.0  1.0  0.5     Lab Results  Component Value Date/Time   TSH 3.06 10/07/2018 02:47 PM   TSH 2.99 03/23/2018 12:00 AM   TSH 2.99 03/23/2018 12:00 AM   TSH 1.71 03/20/2017 03:14 PM       Latest Ref Rng & Units 05/09/2021    9:57 AM 09/02/2020    3:09 AM 09/03/2019    6:07 AM  CBC  WBC 3.4 - 10.8 x10E3/uL 6.8  5.0  7.6  Hemoglobin 13.0 - 17.7 g/dL 14.4  14.8  14.6   Hematocrit 37.5 - 51.0 % 40.0  43.9  44.9   Platelets 150 - 450 x10E3/uL 235  169  189     No results found for: "VD25OH", "VITAMINB12"  Clinical ASCVD: No  The 10-year ASCVD risk score (Arnett DK, et al., 2019) is: 29.8%   Values used to calculate the score:     Age: 82 years     Sex: Male     Is Non-Hispanic African American: Yes     Diabetic: Yes     Tobacco smoker: No     Systolic Blood Pressure: 917 mmHg     Is BP treated: Yes     HDL Cholesterol: 49 mg/dL     Total Cholesterol: 135 mg/dL        11/28/2021   10:03 AM 11/15/2020    9:42 AM 11/10/2019    9:44 AM  Depression screen PHQ 2/9  Decreased Interest 0 0 0  Down, Depressed, Hopeless 0 0 0  PHQ - 2 Score 0 0 0     Social History   Tobacco Use  Smoking Status Former   Packs/day: 1.50   Years: 30.00   Total pack years: 45.00   Types: Cigarettes   Quit date: 10/20/2004   Years since quitting: 17.3  Smokeless Tobacco Never   BP Readings from  Last 3 Encounters:  11/12/21 132/78  10/09/21 138/70  08/27/21 102/60   Pulse Readings from Last 3 Encounters:  01/10/22 78  11/12/21 78  10/29/21 82   Wt Readings from Last 3 Encounters:  01/10/22 (!) 301 lb (136.5 kg)  11/28/21 (!) 301 lb (136.5 kg)  11/12/21 (!) 301 lb (136.5 kg)   BMI Readings from Last 3 Encounters:  01/10/22 41.98 kg/m  11/28/21 41.98 kg/m  11/12/21 41.98 kg/m    Allergies  Allergen Reactions   Empagliflozin Rash    Patient reports yeast.    Ibuprofen Other (See Comments)    Stomach pain    Medications Reviewed Today     Reviewed by [name], LPN (Licensed Practical Nurse) on 11/28/21 at 52  Med List Status: <None>   Medication Order Taking? Sig Documenting Provider Last Dose Status Informant  aspirin EC 81 MG tablet [id number] Yes Take 81 mg by mouth daily. [provider] Taking Active Self  B-D ULTRAFINE III SHORT PEN 31G X 8 MM MISC [id number] Yes  [provider] Taking Active Self  benzonatate (TESSALON) 200 MG capsule [id number] Yes Take 1 capsule (200 mg total) by mouth every 8 (eight) hours as needed for cough. Baird Lyons D, MD Taking Active   chlorhexidine (HIBICLENS) 4 % external liquid [id number] Yes Apply topically daily as needed. [name], MD Taking Active   collagenase Annitta Needs) ointment [id number] Yes Apply 1 application topically daily. Right ankle measures 6.0 x 0.3cm. Evelina Bucy, DPM Taking Active Self  diclofenac Sodium (VOLTAREN) 1 % GEL [id number] Yes APPLY 2 GRAMS TOPICALLY 4 TIMES DAILY TO KNEE [name], MD Taking Active   eplerenone (INSPRA) 25 MG tablet [id number] Yes TAKE 1 TABLET(25 MG) BY MOUTH DAILY [name], MD Taking Active   famotidine (PEPCID) 20 MG tablet [id number] Yes TAKE 1 TABLET(20 MG) BY MOUTH DAILY [name], MD Taking Active   fluticasone (FLONASE) 50 MCG/ACT nasal spray [id number] Yes Place 1 spray into both nostrils daily. [name], MD Taking  Active   Fluticasone-Umeclidin-[facility] ELLIPTA)  100-62.5-25 MCG/ACT AEPB [id number] Yes Inhale 1 puff into the lungs daily. Baird Lyons D, MD Taking Active   glucose blood [facility] ULTRA) test strip [id number] Yes TEST AS DIRECTED 8 TIMES PER DAY Allwardt, Alyssa M, PA-C Taking Active   HUMALOG 100 UNIT/ML injection [id number] Yes Inject into the skin. [provider] Taking Active   HUMALOG KWIKPEN 200 UNIT/ML KwikPen [id number] Yes Inject into the skin. [provider] Taking Active   HUMULIN R U-500 KWIKPEN 500 UNIT/ML Claiborne Rigg [id number] Yes Inject 50-60 Units into the skin See admin instructions. 60 units in the AM, 50 units at lunch and 50 in the evening [name], MD Taking Active   ipratropium-albuterol (DUONEB) 0.5-2.5 (3) MG/3ML Bailey Mech [id number] Yes 1 vial in nebulizer up to 4 times daily Deneise Lever, MD Taking Active   Lancets (ACCU-CHEK MULTICLIX) lancets [id number] Yes  [provider] Taking Active Self  LANTUS SOLOSTAR 100 UNIT/ML Solostar Pen [id number] Yes Inject 55 Units into the skin every morning. [provider] Taking Active   lidocaine (LIDODERM) 5 % [id number] Yes Place 1 patch onto the skin daily. Remove and Discard patch within 12 hours or as directed by MD Clayton Bibles, NP Taking Active   losartan (COZAAR) 50 MG tablet [id number] Yes TAKE 1/2 TABLET(25 MG) BY MOUTH DAILY [name], MD Taking Active   MAGNESIUM-OXIDE 400 (241.3 Mg) MG tablet [id number] Yes Take 1 tablet by mouth 2 (two) times daily. [provider] Taking Active   meloxicam (MOBIC) 15 MG tablet [id number] Yes Take 1 tablet (15 mg total) by mouth daily. Glennon Mac, DO Taking Active   metoprolol tartrate (LOPRESSOR) 50 MG tablet [id number] Yes TAKE 1 TABLET(50 MG) BY MOUTH TWICE DAILY [name], MD Taking Active   nitroGLYCERIN (NITROSTAT) 0.4 MG SL tablet [id number] Yes Place 1 tablet (0.4 mg total) under the tongue every 5 (five) minutes  as needed for chest pain. [name], MD Taking Active   nystatin ointment (MYCOSTATIN) [id number] Yes Apply 1 application topically 2 (two) times daily. [name], MD Taking Active   ofloxacin (OCUFLOX) 0.3 % ophthalmic solution [id number] Yes Place 1 drop into the right eye 4 (four) times daily. [provider] Taking Active   polyethylene glycol (MIRALAX / GLYCOLAX) 17 g packet [id number] Yes Take 17 g by mouth daily. Bonnielee Haff, MD Taking Active   potassium chloride SA (KLOR-CON) 20 MEQ tablet [id number] Yes TAKE 2 TABLETS(40 MEQ) BY MOUTH THREE TIMES DAILY [name], MD Taking Active   prednisoLONE acetate (PRED FORTE) 1 % ophthalmic suspension [id number] Yes Place 1 drop into the right eye 4 (four) times daily. [provider] Taking Active   pregabalin (LYRICA) 50 MG capsule [id number] Yes Take 1 capsule (50 mg total) by mouth at bedtime as needed (nerve pain). Gregor Hams, MD Taking Active   [facility] HFA 108 [phone] Base) MCG/ACT inhaler [id number] Yes Inhale 2 puffs into the lungs every 6 (six) hours as needed for wheezing or shortness of breath. Clayton Bibles, NP Taking Active   rosuvastatin (CRESTOR) 20 MG tablet [id number] Yes Take 1 tablet (20 mg total) by mouth every evening. [name], MD Taking Active   silver sulfADIAZINE (SSD) 1 % cream [id number] Yes APPLY PEA SIZED AMOUNT TOPICALLY TO WOUND DAILY [name], MD Taking Active   torsemide (DEMADEX) 20 MG tablet [id number] Yes TAKE 1 TABLET(20 MG) BY MOUTH TWICE DAILY [name], MD Taking Active  SDOH:  (Social Determinants of Health) assessments and interventions performed: No, done within the year Financial Resource Strain: Low Risk  (11/28/2021)   Overall Financial Resource Strain (CARDIA)    Difficulty of Paying Living Expenses: Not hard at all    SDOH Interventions    [place] from 11/28/2021 in [place] Interventions Intervention Not Indicated  Housing Interventions Intervention Not Indicated  Transportation Interventions Intervention Not Indicated  Financial Strain Interventions Intervention Not Indicated  Physical Activity Interventions Intervention Not Indicated  Stress Interventions Intervention Not Indicated  Social Connections Interventions Intervention Not Indicated       Medication Assistance: None required.  Patient affirms current coverage meets needs.  Medication Access: Within the past 30 days, how often has patient missed a dose of medication? 0 Is a pillbox or other method used to improve adherence? Yes  Factors that may affect medication adherence? no barriers identified Are meds synced by current pharmacy? No  Are meds delivered by current pharmacy? No  Does patient experience delays in picking up medications due to transportation concerns? No   [facility] Services Reviewed: Is patient disadvantaged to use [facility]?: No  Current Rx insurance plan: [facility] Name and location of Current pharmacy:  [facility] [place], [place] [address] Phone: [phone] Fax: [phone]  [place], [place] [address] [place] [address] Phone: [phone] Fax: [phone]  [facility] [place], [place] [address] Phone: [phone] Fax: [phone]  [id number] [place] [address] [place]Phone: [phone]: [phone] [facility] services reviewed with patient today?: Yes  Patient requests to transfer care to [facility]?: No  Reason patient declined to  change pharmacies: Loyalty to other pharmacy/Patient preference  Compliance/Adherence/Medication fill history: Care Gaps: Annual wellness visit in last year? Yes   Star-Rating Drugs: Lantus last filled 03/04/2022 90 DS Losartan 50 mg last filled 01/14/2022 90 DS Rosuvastatin 20 mg last filled 02/15/2022 90 DS   Assessment/Plan                  Current Barriers:  Unable to achieve control of glucose   Pharmacist Clinical Goal(s):  Patient will achieve control of glucose as evidenced by A1c through collaboration with PharmD and provider.   Interventions: 1:1 collaboration with PVivi Barrack MD regarding development and update of comprehensive plan of care as evidenced by provider attestation and co-signature Inter-disciplinary care team collaboration (see longitudinal plan of care) Comprehensive medication review performed; medication list updated in electronic medical record  Hypertension (BP goal <130/80) -Controlled, not discussed today, not assessed today -Current treatment: Losartan '25mg'$  Appropriate, Effective, Safe, Accessible Metoprolol tartrate '50mg'$  BID Appropriate, Effective, Safe, Accessible -Medications previously tried: none noted  -Current home readings: "normal" -Current dietary habits: see DM -Current exercise habits: walking some at grocery store -Denies hypotensive/hypertensive symptoms -Educated on BP goals and benefits of medications for prevention of heart attack, stroke and kidney damage; Exercise goal of 150 minutes per week; Importance of home blood pressure monitoring; Symptoms of hypotension and importance of maintaining adequate hydration; -Counseled  to monitor BP at home a few times per week, document, and provide log at future appointments -Recommended to continue current medication  Hyperlipidemia/CAD: (LDL goal < 70) -Controlled, based on recent LDL March 2023, not assessed today -Current treatment: Rosuvastatin '20mg'$  daily  Appropriate, Effective, Safe, Accessible -Medications previously tried: simvastatin  -Current dietary patterns: see DM -Educated on Cholesterol goals;  Benefits of statin for ASCVD risk reduction; Importance of limiting foods high in cholesterol; -Recommended to continue current medication Most recent LDL is well controlled - no adverse effects.  Reports adherence.  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%) 03/10/22 Uncontrolled, still seeing Delrae Rend (Endo) - labs not up to date -Current medications: Humulin U-500 60-50-50 tid with meals Appropriate, Query effective, Lantus 60-70 units once daily Appropriate, Query effective,  -Medications previously tried: Jardiance (yeast)  -Current home glucose readings fasting glucose: 228, 54 one time in the morning post prandial glucose: no logs today -Denies hypoglycemic/hyperglycemic symptoms -Current exercise: minimal, does walk some around grocery store -Educated on A1c and blood sugar goals; Complications of diabetes including kidney damage, retinal damage, and cardiovascular disease; Prevention and management of hypoglycemic episodes; Benefits of routine self-monitoring of blood sugar; Dietary education regarding carbs, sugars -Counseled to check feet daily and get yearly eye exams -Has continued to work on his diet intake.  Again, has not had visit with PCP.  He is due for A1c and diabetic kidney eval - he wants to switch PCP but recommended he make that switch ASAP.  Reports big fluctuations in fasting glucose as shows above.  Discussed treated hypoglycemia, patient aware to not go long periods without eating due to insulin.  FU with PCP. Get UACR, adjust insulin based on A1c.  Continue to work on diet and exericse.   Initial Visit - March 2023 I believe his intake of sodas alone is a large part of elevated glucose.  He is working on switching to the zero sugar sodas.  Also discussed carbs in other dietary sources.  He was concerned  that his sugar would drop to low.  Explained that we could decrease insulin if sugar started dropping too low. Agreeable to work on sodas, will follow closely.  Recommend recheck A1c in 3 months. -Current meal patterns:  breakfast: eggs, grits, sausage links  lunch: salad, [place] sandwich  dinner: baked chicken, rice w/ broccoli drinks: sodas (4-6 12oz per day), water  Heart Failure (Goal: manage symptoms and prevent exacerbations) 03/10/22 -Controlled -Last ejection fraction: 50-55% -Current treatment: Losartan '25mg'$  Appropriate, Effective, Safe, Accessible Eplerenone '25mg'$  Appropriate, Effective, Safe, Accessible Metoprolol tartrate '50mg'$  BID Appropriate, Effective, Safe, Accessible Torsemide '20mg'$  Appropriate, Effective, Safe, Accessible -Medications previously tried: none noted  -Current home BP/HR readings: "normal" -Current dietary habits: working on processed foods and salt intake -Educated on Benefits of medications for managing symptoms and prolonging life Importance of weighing daily; if you gain more than 3 pounds in one day or 5 pounds in one week, contact providers. -Still monitoring his weights/swelling.  Recommend compression hose.  He denies and shortness of breat besides during periods of cold air and weather. BP/HR controlled, no need for changes.  Patient Goals/Self-Care Activities Patient will:  - take medications as prescribed as evidenced by patient report and record review check glucose daily, document, and provide at future appointments engage in dietary modifications by decreasing regular soda intake  Follow Up Plan: The care management team will reach out to the patient again over the next 90 days.  Beverly Milch, PharmD Clinical Pharmacist  [facility] [phone]

## 2022-03-21 ENCOUNTER — Telehealth: Payer: Self-pay | Admitting: Family Medicine

## 2022-03-21 NOTE — Telephone Encounter (Signed)
Patient has requested a transfer of care, stating that this location is closer for him. This message is to ensure both providers okay with this transition.

## 2022-03-25 ENCOUNTER — Encounter: Payer: Self-pay | Admitting: Family Medicine

## 2022-03-25 ENCOUNTER — Telehealth (INDEPENDENT_AMBULATORY_CARE_PROVIDER_SITE_OTHER): Payer: 59 | Admitting: Family Medicine

## 2022-03-25 VITALS — Ht 71.0 in | Wt 301.0 lb

## 2022-03-25 DIAGNOSIS — J410 Simple chronic bronchitis: Secondary | ICD-10-CM

## 2022-03-25 DIAGNOSIS — Z794 Long term (current) use of insulin: Secondary | ICD-10-CM | POA: Diagnosis not present

## 2022-03-25 DIAGNOSIS — E1169 Type 2 diabetes mellitus with other specified complication: Secondary | ICD-10-CM | POA: Diagnosis not present

## 2022-03-25 DIAGNOSIS — U071 COVID-19: Secondary | ICD-10-CM | POA: Diagnosis not present

## 2022-03-25 MED ORDER — BENZONATATE 200 MG PO CAPS: 200.0000 mg | ORAL_CAPSULE | Freq: Two times a day (BID) | ORAL | 0 refills | Status: DC | PRN

## 2022-03-25 MED ORDER — MOLNUPIRAVIR 200 MG PO CAPS: 4.0000 | ORAL_CAPSULE | Freq: Two times a day (BID) | ORAL | 0 refills | Status: AC

## 2022-03-25 NOTE — Progress Notes (Signed)
   [name] [name] is a 67 y.o. male who presents today for a virtual office visit.  Assessment/Plan:  New/Acute Problems: COVID Patient not able to get testing for COVID however will empirically treat given his constellation of symptoms and known close contact.  He is additionally at high risk for complication from COVID due to he has several underlying comorbidities.  Reassuring visual exam today without any signs of respiratory distress.  We will start molnupiravir.  Also start Tessalon for cough.  Encouraged hydration.  He will continue his home inhalers for his bronchitis.  He will let us know if not improving.  We discussed reasons to return to care or seek emergent care.   Chronic Problems Addressed Today: Chronic Bronchitis Follows with pulmonology for this.  He is currently on Trelegy daily and albuterol as needed.  He has mild flare related to above COVID which we are treating. Will continue his current treatment plan.   CAD / HfpEF Increases his risk for complication from COVID.  His medication regimen is currently optimized via cardiology.    T2DM Follows with endocrinology.  Increases risk for complication from COVID.    Subjective:  HPI:  See A/p for status of chronic conditions.   Patient is concerned that he may have covid. His symptoms started about 3 days ago. Symptoms include cough, sore throat, body aches, and chills. [name] [name] was recently was diagnosed with covid. He has also had a few other family members recently test postive for covid. He has no way of checking himself for covid currently. No fevers. Some mildly increased work of breathing. Tried taking cough medication which has not helped.  Symptoms stable the last couple of days. No reported chest pain.        Objective/Observations  Physical Exam: Gen: NAD, resting comfortably Pulm: Normal work of breathing Neuro: Grossly normal, moves all extremities Psych: Normal affect and thought content  Virtual  Visit via Video   I connected with [name] on 03/25/22 at 11:00 AM EST by a video enabled telemedicine application and verified that I am speaking with the correct person using two identifiers. The limitations of evaluation and management by telemedicine and the availability of in person appointments were discussed. The patient expressed understanding and agreed to proceed.   Patient location: Home Provider location: [place] participating in the virtual visit: Myself and Patient     Algis Greenhouse. [name], MD 03/25/2022 11:35 AM

## 2022-04-03 DIAGNOSIS — Z794 Long term (current) use of insulin: Secondary | ICD-10-CM | POA: Diagnosis not present

## 2022-04-03 DIAGNOSIS — E1142 Type 2 diabetes mellitus with diabetic polyneuropathy: Secondary | ICD-10-CM | POA: Diagnosis not present

## 2022-04-03 DIAGNOSIS — E1165 Type 2 diabetes mellitus with hyperglycemia: Secondary | ICD-10-CM | POA: Diagnosis not present

## 2022-04-03 DIAGNOSIS — Z8719 Personal history of other diseases of the digestive system: Secondary | ICD-10-CM | POA: Diagnosis not present

## 2022-04-07 ENCOUNTER — Other Ambulatory Visit: Payer: Self-pay

## 2022-04-07 ENCOUNTER — Other Ambulatory Visit: Payer: Self-pay | Admitting: Cardiology

## 2022-04-07 DIAGNOSIS — I10 Essential (primary) hypertension: Secondary | ICD-10-CM

## 2022-04-07 MED ORDER — LOSARTAN POTASSIUM 50 MG PO TABS: ORAL_TABLET | ORAL | 0 refills | Status: DC

## 2022-04-08 ENCOUNTER — Encounter: Payer: Self-pay | Admitting: Cardiology

## 2022-04-08 ENCOUNTER — Ambulatory Visit: Payer: 59 | Attending: Cardiology | Admitting: Cardiology

## 2022-04-08 VITALS — BP 112/58 | HR 76 | Ht 71.0 in | Wt 302.0 lb

## 2022-04-08 DIAGNOSIS — I25708 Atherosclerosis of coronary artery bypass graft(s), unspecified, with other forms of angina pectoris: Secondary | ICD-10-CM | POA: Diagnosis not present

## 2022-04-08 DIAGNOSIS — I491 Atrial premature depolarization: Secondary | ICD-10-CM

## 2022-04-08 DIAGNOSIS — I5032 Chronic diastolic (congestive) heart failure: Secondary | ICD-10-CM

## 2022-04-08 DIAGNOSIS — I10 Essential (primary) hypertension: Secondary | ICD-10-CM | POA: Diagnosis not present

## 2022-04-08 DIAGNOSIS — I739 Peripheral vascular disease, unspecified: Secondary | ICD-10-CM | POA: Diagnosis not present

## 2022-04-08 DIAGNOSIS — Z79899 Other long term (current) drug therapy: Secondary | ICD-10-CM

## 2022-04-08 DIAGNOSIS — E782 Mixed hyperlipidemia: Secondary | ICD-10-CM

## 2022-04-08 NOTE — Progress Notes (Signed)
Cardiology Office Note:    Date:  04/08/2022   ID:  [name] [name], DOB December 08, 1955, MRN [id number]  PCP:  [name], MD  [facility] Cardiologist:  Candee Furbish, MD  [facility] Electrophysiologist:  None   Referring MD: [name], MD    History of Present Illness:    [name] [name] is a 67 y.o. male here for follow-up of coronary artery disease post CABG [id number], diastolic heart failure, and hypertension.  Prior visit with [name], PA-C on 11/06/20. Noted to have some increased right LE swelling, which was unusual for him (usually worse in left). He was instructed to take 1 extra dose of 20 mg torsemide that day. A right LE venous doppler was ordered to rule out DVT, performed 9/28 and was negative.  Has battled with hypokalemia in the past.  On supplementation.  CAD status post CABG in 2005.  Previously doing quite well minor shortness of breath with activity.  NYHA class II.  Has had prior ectopic atrial rhythm.  Dr. Curt Bears has reviewed in the past.  On metoprolol.  EF low normal.  Also has right bundle branch block, obstructive sleep apnea.  Dr. Annamaria Boots.  Occasional indigestion. Fleeting pain. Burp helped.    Past Medical History:  Diagnosis Date   Acute myocardial infarction, unspecified site, episode of care unspecified 2005   Acute pancreatitis    CAD (coronary artery disease)    a. CABG in 2005 w LIMA to LAD, left radial to second circumflex marginal, saphenous vein graft to PDA, saphenous vein graft to lateral subbrach of ramus intermediate, and sequential saphenous vein graft to the medial subbranch of ramus intermediate.   Chronic diastolic CHF (congestive heart failure) (HCC)    Cirrhosis of liver without mention of alcohol    CKD (chronic kidney disease), stage II    Complications affecting other specified body systems, hypertension    Ectopic atrial rhythm    Essential hypertension    Hyperlipidemia    Hypokalemia    a. intermitttent  noncompliance with potassium supplement.   Hypomagnesemia    Morbid obesity (HCC)    Neuropathy    Other and unspecified hyperlipidemia    Proteinuria    RBBB    Sleep apnea    Type II or unspecified type diabetes mellitus without mention of complication, not stated as uncontrolled    Varicose veins of both lower extremities     Past Surgical History:  Procedure Laterality Date   CORONARY ARTERY BYPASS GRAFT     2005   I & D EXTREMITY Left 09/02/2019   Procedure: IRRIGATION AND DEBRIDEMENT 4th TOE DEBRIDIMENT;  Surgeon: Evelina Bucy, DPM;  Location: [facility];  Service: Podiatry;  Laterality: Left;    Current Medications: Current Meds  Medication Sig   aspirin EC 81 MG tablet Take 81 mg by mouth daily.   B-D ULTRAFINE III SHORT PEN 31G X 8 MM MISC    benzonatate (TESSALON) 200 MG capsule Take 1 capsule (200 mg total) by mouth 2 (two) times daily as needed for cough.   chlorhexidine (HIBICLENS) 4 % external liquid Apply topically daily as needed.   collagenase (SANTYL) ointment Apply 1 application topically daily. Right ankle measures 6.0 x 0.3cm.   diclofenac Sodium (VOLTAREN) 1 % GEL APPLY 2 GRAMS TOPICALLY 4 TIMES DAILY TO KNEE   eplerenone (INSPRA) 25 MG tablet TAKE 1 TABLET(25 MG) BY MOUTH DAILY   famotidine (PEPCID) 20 MG tablet TAKE 1 TABLET(20 MG)  BY MOUTH DAILY   fluticasone (FLONASE) 50 MCG/ACT nasal spray Place 1 spray into both nostrils daily.   Fluticasone-Umeclidin-Vilant (TRELEGY ELLIPTA) 100-62.5-25 MCG/ACT AEPB Inhale 1 puff into the lungs daily.   glucose blood (ONETOUCH ULTRA) test strip TEST AS DIRECTED 8 TIMES DAILY   HUMALOG 100 UNIT/ML injection Inject into the skin.   HUMALOG KWIKPEN 200 UNIT/ML KwikPen Inject into the skin.   HUMULIN R U-500 KWIKPEN 500 UNIT/ML kwikpen Inject 50-60 Units into the skin See admin instructions. 60 units in the AM, 50 units at lunch and 50 in the evening   ipratropium-albuterol (DUONEB) 0.5-2.5 (3) MG/3ML SOLN 1 vial in  nebulizer up to 4 times daily   Lancets (ACCU-CHEK MULTICLIX) lancets    LANTUS SOLOSTAR 100 UNIT/ML Solostar Pen Inject 55 Units into the skin every morning.   lidocaine (LIDODERM) 5 % Place 1 patch onto the skin daily. Remove and Discard patch within 12 hours or as directed by MD   losartan (COZAAR) 50 MG tablet TAKE 1/2 TABLET(25 MG) BY MOUTH DAILY   MAGNESIUM-OXIDE 400 (241.3 Mg) MG tablet Take 1 tablet by mouth 2 (two) times daily.   meloxicam (MOBIC) 15 MG tablet Take 1 tablet (15 mg total) by mouth daily.   metoprolol tartrate (LOPRESSOR) 50 MG tablet TAKE 1 TABLET(50 MG) BY MOUTH TWICE DAILY   nitroGLYCERIN (NITROSTAT) 0.4 MG SL tablet Place 1 tablet (0.4 mg total) under the tongue every 5 (five) minutes as needed for chest pain.   nystatin ointment (MYCOSTATIN) Apply 1 application topically 2 (two) times daily.   ofloxacin (OCUFLOX) 0.3 % ophthalmic solution Place 1 drop into the right eye 4 (four) times daily.   polyethylene glycol (MIRALAX / GLYCOLAX) 17 g packet Take 17 g by mouth daily.   potassium chloride SA (KLOR-CON M) 20 MEQ tablet TAKE 2 TABLETS(40 MEQ) BY MOUTH THREE TIMES DAILY   pregabalin (LYRICA) 50 MG capsule Take 1 capsule (50 mg total) by mouth at bedtime as needed (nerve pain).   PROAIR HFA 108 (90 Base) MCG/ACT inhaler Inhale 2 puffs into the lungs every 6 (six) hours as needed for wheezing or shortness of breath.   rosuvastatin (CRESTOR) 20 MG tablet Take 1 tablet (20 mg total) by mouth every evening.   silver sulfADIAZINE (SSD) 1 % cream APPLY PEA SIZED AMOUNT TOPICALLY TO WOUND DAILY   torsemide (DEMADEX) 20 MG tablet TAKE 1 TABLET(20 MG) BY MOUTH TWICE DAILY     Allergies:   Empagliflozin and Ibuprofen   Social History   Socioeconomic History   Marital status: Significant Other    Spouse name: Not on file   Number of children: Not on file   Years of education: Not on file   Highest education level: Not on file  Occupational History   Occupation:  Disabled     Comment: previously worked in delivery   Tobacco Use   Smoking status: Former    Packs/day: 1.50    Years: 30.00    Total pack years: 45.00    Types: Cigarettes    Quit date: 10/20/2004    Years since quitting: 17.4   Smokeless tobacco: Never  Vaping Use   Vaping Use: Never used  Substance and Sexual Activity   Alcohol use: Yes    Comment: occasional   Drug use: No   Sexual activity: Not on file  Other Topics Concern   Not on file  Social History Narrative   Lives with significant other x 30+ years, apartment "friends  smoke". Disability. Is disabled secondary to his work-related injury.       Has over 20 grandchildren    Social Determinants of Health   Financial Resource Strain: Low Risk  (11/28/2021)   Overall Financial Resource Strain (CARDIA)    Difficulty of Paying Living Expenses: Not hard at all  Food Insecurity: No Food Insecurity (11/28/2021)   Hunger Vital Sign    Worried About Running Out of Food in the Last Year: Never true    Ran Out of Food in the Last Year: Never true  Transportation Needs: No Transportation Needs (11/28/2021)   PRAPARE - Hydrologist (Medical): No    Lack of Transportation (Non-Medical): No  Physical Activity: Sufficiently Active (11/28/2021)   Exercise Vital Sign    Days of Exercise per Week: 5 days    Minutes of Exercise per Session: 30 min  Stress: No Stress Concern Present (11/28/2021)   [place]    Feeling of Stress : Not at all  Social Connections: Socially Isolated (11/28/2021)   Social Connection and Isolation Panel [NHANES]    Frequency of Communication with Friends and Family: More than three times a week    Frequency of Social Gatherings with Friends and Family: More than three times a week    Attends Religious Services: Never    Marine scientist or Organizations: No    Attends Music therapist:  Never    Marital Status: Never married     Family History: The patient's family history includes Breast cancer in his mother; Coronary artery disease in his father; Prostate cancer in his father.  ROS:   Please see the history of present illness. All other systems are reviewed and negative.    EKGs/Labs/Other Studies Reviewed:    CXR 12/17/2020: FINDINGS: Unchanged, enlarged cardiac silhouette with prior median sternotomy and CABG. There is mild central pulmonary vascular prominence and persistent mild central peribronchial thickening. No new airspace disease. No large pleural effusion or visible pneumothorax. No acute osseous abnormality. Unchanged appearance of the right AC joint.   IMPRESSION: Central pulmonary vascular prominence and persistent mild central peribronchial thickening. No new airspace disease.  LE Venous Doppler 11/07/2020: Summary:  RIGHT:  - No evidence of deep vein thrombosis in the lower extremity. No indirect  evidence of obstruction proximal to the inguinal ligament.  - No cystic structure found in the popliteal fossa.  - Limited visualization of the posterior tibial veins and peroneal veins  due to increase lower leg swelling. Specifically, the tibioperoneal  confluence and popliteal vein are without thrombus.     LEFT:  - No cystic structure found in the popliteal fossa.  ABI Dopplers 08/06/2018: Previous ABI 08/22/14 at [facility] location.     Summary:  Right: Resting right ankle-brachial index indicates mild right lower  extremity arterial disease. The right toe-brachial index is normal. RT  great toe pressure = 91 mmHg.   Left: Resting left ankle-brachial index indicates mild left lower  extremity arterial disease. The left toe-brachial index is normal. LT  Great toe pressure = 94 mmHg.   ECHO 2019: - Left ventricle: The cavity size was normal. Wall thickness was    normal. Systolic function was normal. The estimated ejection    fraction  was in the range of 50% to 55%. Left ventricular    diastolic function parameters were normal.  - Mitral valve: There was mild regurgitation.  - Right ventricle:  The cavity size was mildly dilated.  - Right atrium: The atrium was moderately dilated.   Impressions:   - Poor acoustic windows limit study even with use of Definity.  Stress Myoview 11/16/2013: Notes Recorded by Minus Breeding, MD on 11/16/2013 at 4:43 PM Negative stress perfusion study.   No ischemia, EF not gated; low risk   EKG:  EKG is personally reviewed and interpreted.  04/08/2022: Sinus rhythm 76 PACs left axis deviation right bundle branch block no change from prior  11/06/2020 [name], PA-C): NSR, HR 71, inferior Q waves, right bundle branch block, nonspecific ST-T wave changes, QTC 456, LVH.   Recent Labs: 05/09/2021: ALT 13; Hemoglobin 14.4; Platelets 235 07/30/2021: BUN 17; Creatinine, Ser 1.47; Potassium 3.4; Sodium 133   Recent Lipid Panel    Component Value Date/Time   CHOL 135 05/09/2021 0957   TRIG 138 05/09/2021 0957   HDL 49 05/09/2021 0957   CHOLHDL 2.8 05/09/2021 0957   CHOLHDL 3 10/07/2018 1447   VLDL 46.4 (H) 10/07/2018 1447   LDLCALC 62 05/09/2021 0957   LDLDIRECT 71.0 10/07/2018 1447    Physical Exam:    VS:  BP (!) 112/58 (BP Location: Left Arm, Patient Position: Sitting, Cuff Size: Normal)   Pulse 76   Ht '5\' 11"'$  (1.803 m)   Wt (!) 302 lb (137 kg)   BMI 42.12 kg/m     Wt Readings from Last 3 Encounters:  04/08/22 (!) 302 lb (137 kg)  03/25/22 (!) 301 lb (136.5 kg)  01/10/22 (!) 301 lb (136.5 kg)     GEN:  Well nourished, well developed in no acute distress HEENT: Normal NECK: No JVD; No carotid bruits LYMPHATICS: No lymphadenopathy CARDIAC: RRR, no murmurs, rubs, gallops RESPIRATORY:  Clear to auscultation without rales, wheezing or rhonchi  ABDOMEN: Soft, non-tender, non-distended MUSCULOSKELETAL: Hardened chronic lower extremity edema; No deformity no change from  before. SKIN: Warm and dry NEUROLOGIC:  Alert and oriented x 3 PSYCHIATRIC:  Normal affect   ASSESSMENT:    1. Coronary artery disease of bypass graft of native heart with stable angina pectoris ([place])   2. Essential hypertension   3. Medication management   4. Mixed hyperlipidemia   5. PVD (peripheral vascular disease) ([place])   6. Chronic heart failure with preserved ejection fraction (HCC)   7. Ectopic atrial rhythm      PLAN:    In order of problems listed above:   Coronary artery disease of bypass graft of native heart with stable angina pectoris [facility]) Prior CABG 2005 stress test in 2015 was low risk.  No angina currently.  LIMA to LAD, radial to second marginal, SVG to PDA.  SVG to ramus.  Continue with goal-directed medical therapy.  We will check a CBC, complete metabolic profile, lipid panel.  Continue to encourage activity, exercise.  He did ask for a scooter.  I think would be helpful for him to continue to get as much mobility as possible.  Compression stockings.  Hypokalemia On 05/27/2021 2.7, in June 2023 3.4.  Labs.  Supposedly taking potassium 40 mill equivalents 3 times a day.  Takes torsemide 20 mg twice a day.  PVD (peripheral vascular disease) (HCC) Stable left leg claudication.  Prior ABIs reviewed as above.  Chronic heart failure with preserved ejection fraction (HCC) Takes torsemide 40 mg a day, eplerenone 25 mg a day.  Cannot take SGLT2 inhibitors because of prior yeast infection.  Stable.  EF 50 to 55%.  NYHA class IIb.  Potassium supplementation.  No changes made.  HLD (hyperlipidemia) Prior LDL 75.  Okay to continue with simvastatin 40 mg.  If LDL remains above 70 in the future, consider Zetia.  Repeating lipid panel.  Varicose vein of leg Prior right leg swelling.  Chronic edema.  Venous Dopplers right lower extremity was negative for DVT by Doppler.  Will suggest compression stockings.  Type 2 diabetes mellitus with other specified complication ([place]),  followed by Dr. Buddy Duty Managed by Dr. Buddy Duty.  Cannot take SGLT2 inhibitors because of yeast infection.  Morbid obesity ([place]) Continue to encourage weight loss, decrease carbohydrates.  Mobility.   Follow-up: 6 months  Medication Adjustments/Labs and Tests Ordered: Current medicines are reviewed at length with the patient today.  Concerns regarding medicines are outlined above.   Orders Placed This Encounter  Procedures   CBC   Comprehensive metabolic panel   Lipid panel   EKG 12-Lead   No orders of the defined types were placed in this encounter.  Patient Instructions  Medication Instructions:  The current medical regimen is effective;  continue present plan and medications.  *If you need a refill on your cardiac medications before your next appointment, please call your pharmacy*  Lab Work: Please have blood work today (CMP, CBC and Lipid)  If you have labs (blood work) drawn today and your tests are completely normal, you will receive your results only by: MyChart Message (if you have MyChart) OR A paper copy in the mail If you have any lab test that is abnormal or we need to change your treatment, we will call you to review the results.  Follow-Up: At [facility], you and your health needs are our priority.  As part of our continuing mission to provide you with exceptional heart care, we have created designated Provider Care Teams.  These Care Teams include your primary Cardiologist (physician) and Advanced Practice Providers (APPs -  Physician Assistants and Nurse Practitioners) who all work together to provide you with the care you need, when you need it.  We recommend signing up for the patient portal called "MyChart".  Sign up information is provided on this After Visit Summary.  MyChart is used to connect with patients for Virtual Visits (Telemedicine).  Patients are able to view lab/test results, encounter notes, upcoming appointments, etc.  Non-urgent messages  can be sent to your provider as well.   To learn more about what you can do with MyChart, go to [email or web address].    Your next appointment:   6 month(s)  Provider:   Nicholes Rough, PA-C, Melina Copa, PA-C, Ambrose Pancoast, NP, Ermalinda Barrios, PA-C, Christen Bame, NP, or [name], PA-C         Wear knee high compression stockings daily.   I,Mathew Stumpf,acting as a Education administrator for [employer], MD.,have documented all relevant documentation on the behalf of Candee Furbish, MD,as directed by  Candee Furbish, MD while in the presence of Candee Furbish, MD.  I, Candee Furbish, MD, have reviewed all documentation for this visit. The documentation on 04/08/22 for the exam, diagnosis, procedures, and orders are all accurate and complete.   Signed, Candee Furbish, MD  04/08/2022 9:58 AM    [facility]

## 2022-04-08 NOTE — Patient Instructions (Signed)
Medication Instructions:  The current medical regimen is effective;  continue present plan and medications.  *If you need a refill on your cardiac medications before your next appointment, please call your pharmacy*  Lab Work: Please have blood work today (CMP, CBC and Lipid)  If you have labs (blood work) drawn today and your tests are completely normal, you will receive your results only by: MyChart Message (if you have MyChart) OR A paper copy in the mail If you have any lab test that is abnormal or we need to change your treatment, we will call you to review the results.  Follow-Up: At [facility], you and your health needs are our priority.  As part of our continuing mission to provide you with exceptional heart care, we have created designated Provider Care Teams.  These Care Teams include your primary Cardiologist (physician) and Advanced Practice Providers (APPs -  Physician Assistants and Nurse Practitioners) who all work together to provide you with the care you need, when you need it.  We recommend signing up for the patient portal called "MyChart".  Sign up information is provided on this After Visit Summary.  MyChart is used to connect with patients for Virtual Visits (Telemedicine).  Patients are able to view lab/test results, encounter notes, upcoming appointments, etc.  Non-urgent messages can be sent to your provider as well.   To learn more about what you can do with MyChart, go to [email or web address].    Your next appointment:   6 month(s)  Provider:   Nicholes Rough, PA-C, Melina Copa, PA-C, Ambrose Pancoast, NP, Ermalinda Barrios, PA-C, Christen Bame, NP, or [name], PA-C         Wear knee high compression stockings daily.

## 2022-04-09 LAB — COMPREHENSIVE METABOLIC PANEL
ALT: 11 IU/L (ref 0–44)
AST: 21 IU/L (ref 0–40)
Albumin/Globulin Ratio: 0.9 — ABNORMAL LOW (ref 1.2–2.2)
Albumin: 3.7 g/dL — ABNORMAL LOW (ref 3.9–4.9)
Alkaline Phosphatase: 135 IU/L — ABNORMAL HIGH (ref 44–121)
BUN/Creatinine Ratio: 12 (ref 10–24)
BUN: 21 mg/dL (ref 8–27)
Bilirubin Total: 0.8 mg/dL (ref 0.0–1.2)
CO2: 26 mmol/L (ref 20–29)
Calcium: 8.6 mg/dL (ref 8.6–10.2)
Chloride: 96 mmol/L (ref 96–106)
Creatinine, Ser: 1.72 mg/dL — ABNORMAL HIGH (ref 0.76–1.27)
Globulin, Total: 3.9 g/dL (ref 1.5–4.5)
Glucose: 290 mg/dL — ABNORMAL HIGH (ref 70–99)
Potassium: 3.4 mmol/L — ABNORMAL LOW (ref 3.5–5.2)
Sodium: 141 mmol/L (ref 134–144)
Total Protein: 7.6 g/dL (ref 6.0–8.5)
eGFR: 43 mL/min/{1.73_m2} — ABNORMAL LOW (ref >59)

## 2022-04-09 LAB — CBC
Hematocrit: 42 % (ref 37.5–51.0)
Hemoglobin: 14.5 g/dL (ref 13.0–17.7)
MCH: 34.3 pg — ABNORMAL HIGH (ref 26.6–33.0)
MCHC: 34.5 g/dL (ref 31.5–35.7)
MCV: 99 fL — ABNORMAL HIGH (ref 79–97)
Platelets: 234 10*3/uL (ref 150–450)
RBC: 4.23 x10E6/uL (ref 4.14–5.80)
RDW: 13.5 % (ref 11.6–15.4)
WBC: 6.6 10*3/uL (ref 3.4–10.8)

## 2022-04-09 LAB — LIPID PANEL
Chol/HDL Ratio: 2.8 ratio (ref 0.0–5.0)
Cholesterol, Total: 130 mg/dL (ref 100–199)
HDL: 46 mg/dL (ref >39)
LDL Chol Calc (NIH): 53 mg/dL (ref 0–99)
Triglycerides: 186 mg/dL — ABNORMAL HIGH (ref 0–149)
VLDL Cholesterol Cal: 31 mg/dL (ref 5–40)

## 2022-04-12 ENCOUNTER — Emergency Department (HOSPITAL_COMMUNITY): Payer: 59

## 2022-04-12 ENCOUNTER — Encounter (HOSPITAL_COMMUNITY): Payer: Self-pay | Admitting: Emergency Medicine

## 2022-04-12 ENCOUNTER — Emergency Department (HOSPITAL_COMMUNITY)
Admission: EM | Admit: 2022-04-12 | Discharge: 2022-04-13 | Disposition: A | Discharge status: Home / Self Care | Payer: 59 | Attending: Emergency Medicine | Admitting: Emergency Medicine

## 2022-04-12 ENCOUNTER — Other Ambulatory Visit: Payer: Self-pay

## 2022-04-12 DIAGNOSIS — N189 Chronic kidney disease, unspecified: Secondary | ICD-10-CM | POA: Diagnosis not present

## 2022-04-12 DIAGNOSIS — R29810 Facial weakness: Secondary | ICD-10-CM | POA: Diagnosis present

## 2022-04-12 DIAGNOSIS — I129 Hypertensive chronic kidney disease with stage 1 through stage 4 chronic kidney disease, or unspecified chronic kidney disease: Secondary | ICD-10-CM | POA: Diagnosis not present

## 2022-04-12 DIAGNOSIS — I491 Atrial premature depolarization: Secondary | ICD-10-CM | POA: Insufficient documentation

## 2022-04-12 DIAGNOSIS — R29818 Other symptoms and signs involving the nervous system: Secondary | ICD-10-CM | POA: Diagnosis not present

## 2022-04-12 DIAGNOSIS — Z79899 Other long term (current) drug therapy: Secondary | ICD-10-CM | POA: Diagnosis not present

## 2022-04-12 DIAGNOSIS — R739 Hyperglycemia, unspecified: Secondary | ICD-10-CM | POA: Diagnosis not present

## 2022-04-12 DIAGNOSIS — I6782 Cerebral ischemia: Secondary | ICD-10-CM | POA: Insufficient documentation

## 2022-04-12 DIAGNOSIS — E876 Hypokalemia: Secondary | ICD-10-CM | POA: Insufficient documentation

## 2022-04-12 DIAGNOSIS — Y900 Blood alcohol level of less than 20 mg/100 ml: Secondary | ICD-10-CM | POA: Diagnosis not present

## 2022-04-12 DIAGNOSIS — Z7982 Long term (current) use of aspirin: Secondary | ICD-10-CM | POA: Insufficient documentation

## 2022-04-12 DIAGNOSIS — I4510 Unspecified right bundle-branch block: Secondary | ICD-10-CM | POA: Diagnosis not present

## 2022-04-12 DIAGNOSIS — N1831 Chronic kidney disease, stage 3a: Secondary | ICD-10-CM | POA: Insufficient documentation

## 2022-04-12 DIAGNOSIS — R4781 Slurred speech: Secondary | ICD-10-CM | POA: Insufficient documentation

## 2022-04-12 DIAGNOSIS — G510 Bell's palsy: Secondary | ICD-10-CM | POA: Insufficient documentation

## 2022-04-12 DIAGNOSIS — G4489 Other headache syndrome: Secondary | ICD-10-CM | POA: Diagnosis not present

## 2022-04-12 LAB — DIFFERENTIAL
Abs Immature Granulocytes: 0.01 10*3/uL (ref 0.00–0.07)
Basophils Absolute: 0.1 10*3/uL (ref 0.0–0.1)
Basophils Relative: 1 %
Eosinophils Absolute: 0.2 10*3/uL (ref 0.0–0.5)
Eosinophils Relative: 3 %
Immature Granulocytes: 0 %
Lymphocytes Relative: 43 %
Lymphs Abs: 3 10*3/uL (ref 0.7–4.0)
Monocytes Absolute: 0.7 10*3/uL (ref 0.1–1.0)
Monocytes Relative: 11 %
Neutro Abs: 2.8 10*3/uL (ref 1.7–7.7)
Neutrophils Relative %: 42 %

## 2022-04-12 LAB — COMPREHENSIVE METABOLIC PANEL
ALT: 12 U/L (ref 0–44)
AST: 22 U/L (ref 15–41)
Albumin: 3.2 g/dL — ABNORMAL LOW (ref 3.5–5.0)
Alkaline Phosphatase: 105 U/L (ref 38–126)
Anion gap: 10 (ref 5–15)
BUN: 17 mg/dL (ref 8–23)
CO2: 29 mmol/L (ref 22–32)
Calcium: 8.6 mg/dL — ABNORMAL LOW (ref 8.9–10.3)
Chloride: 97 mmol/L — ABNORMAL LOW (ref 98–111)
Creatinine, Ser: 1.5 mg/dL — ABNORMAL HIGH (ref 0.61–1.24)
GFR, Estimated: 50 mL/min — ABNORMAL LOW (ref >60)
Glucose, Bld: 251 mg/dL — ABNORMAL HIGH (ref 70–99)
Potassium: 2.7 mmol/L — CL (ref 3.5–5.1)
Sodium: 136 mmol/L (ref 135–145)
Total Bilirubin: 1 mg/dL (ref 0.3–1.2)
Total Protein: 8.6 g/dL — ABNORMAL HIGH (ref 6.5–8.1)

## 2022-04-12 LAB — ETHANOL: Alcohol, Ethyl (B): <10 mg/dL (ref <10)

## 2022-04-12 LAB — CBG MONITORING, ED: Glucose-Capillary: 230 mg/dL — ABNORMAL HIGH (ref 70–99)

## 2022-04-12 LAB — APTT: aPTT: 33 seconds (ref 24–36)

## 2022-04-12 LAB — CBC
HCT: 41.9 % (ref 39.0–52.0)
Hemoglobin: 14.6 g/dL (ref 13.0–17.0)
MCH: 34.7 pg — ABNORMAL HIGH (ref 26.0–34.0)
MCHC: 34.8 g/dL (ref 30.0–36.0)
MCV: 99.5 fL (ref 80.0–100.0)
Platelets: 198 10*3/uL (ref 150–400)
RBC: 4.21 MIL/uL — ABNORMAL LOW (ref 4.22–5.81)
RDW: 15.3 % (ref 11.5–15.5)
WBC: 6.7 10*3/uL (ref 4.0–10.5)
nRBC: 0 % (ref 0.0–0.2)

## 2022-04-12 LAB — I-STAT CHEM 8, ED
BUN: 19 mg/dL (ref 8–23)
Calcium, Ion: 1.02 mmol/L — ABNORMAL LOW (ref 1.15–1.40)
Chloride: 95 mmol/L — ABNORMAL LOW (ref 98–111)
Creatinine, Ser: 1.4 mg/dL — ABNORMAL HIGH (ref 0.61–1.24)
Glucose, Bld: 251 mg/dL — ABNORMAL HIGH (ref 70–99)
HCT: 46 % (ref 39.0–52.0)
Hemoglobin: 15.6 g/dL (ref 13.0–17.0)
Potassium: 2.6 mmol/L — CL (ref 3.5–5.1)
Sodium: 140 mmol/L (ref 135–145)
TCO2: 28 mmol/L (ref 22–32)

## 2022-04-12 LAB — MAGNESIUM: Magnesium: 1.5 mg/dL — ABNORMAL LOW (ref 1.7–2.4)

## 2022-04-12 LAB — PROTIME-INR
INR: 1.2 (ref 0.8–1.2)
Prothrombin Time: 14.9 seconds (ref 11.4–15.2)

## 2022-04-12 MED ORDER — POTASSIUM CHLORIDE 10 MEQ/100ML IV SOLN
10.0000 meq | INTRAVENOUS | Status: AC
  Administered 2022-04-12 (×2): 10 meq via INTRAVENOUS
  Filled 2022-04-12 (×2): qty 100

## 2022-04-12 MED ORDER — SODIUM CHLORIDE 0.9% FLUSH: 3.0000 mL | Freq: Once | INTRAVENOUS | Status: DC

## 2022-04-12 MED ORDER — POTASSIUM CHLORIDE CRYS ER 20 MEQ PO TBCR
40.0000 meq | EXTENDED_RELEASE_TABLET | Freq: Once | ORAL | Status: AC
  Administered 2022-04-12: 40 meq via ORAL
  Filled 2022-04-12: qty 2

## 2022-04-12 MED ORDER — MAGNESIUM SULFATE 2 GM/50ML IV SOLN
2.0000 g | Freq: Once | INTRAVENOUS | Status: AC
  Administered 2022-04-12: 2 g via INTRAVENOUS
  Filled 2022-04-12: qty 50

## 2022-04-12 MED ORDER — LORAZEPAM 2 MG/ML IJ SOLN
1.0000 mg | Freq: Once | INTRAMUSCULAR | Status: AC
  Administered 2022-04-12: 1 mg via INTRAVENOUS
  Filled 2022-04-12: qty 1

## 2022-04-12 MED ORDER — POTASSIUM CHLORIDE 10 MEQ/100ML IV SOLN
10.0000 meq | INTRAVENOUS | Status: AC
  Administered 2022-04-12 (×3): 10 meq via INTRAVENOUS
  Filled 2022-04-12 (×3): qty 100

## 2022-04-12 MED ORDER — PREDNISONE 10 MG PO TABS: 60.0000 mg | ORAL_TABLET | Freq: Every day | ORAL | 0 refills | Status: DC

## 2022-04-12 NOTE — ED Notes (Signed)
Patient c/o left sided facial droop and numbness with slurred speech. MD made aware. Last known normal @ 1200 yesterday.

## 2022-04-12 NOTE — ED Notes (Signed)
ED TO INPATIENT HANDOFF REPORT  ED Nurse Name and Phone #:   S Name/Age/Gender [name] [name] 67 y.o. male Room/Bed: 030C/030C  Code Status   Code Status: Prior  Home/SNF/Other Home Patient oriented to: self, place, time, and situation Is this baseline? Yes   Triage Complete: Triage complete  Chief Complaint Facial droop, slurred speech  Triage Note Pt c/o R sided facial droop, mostly R sided tongue numbness and R sided headache since yesterday morning.  Per ems:  Bp 134/88 Hr 80 Sats 96% Cbg 324  Pt ao x 4.   Allergies Allergies  Allergen Reactions   Empagliflozin Rash    Patient reports yeast.    Ibuprofen Other (See Comments)    Stomach pain    Level of Care/Admitting Diagnosis ED Disposition     None       B Medical/Surgery History Past Medical History:  Diagnosis Date   Acute myocardial infarction, unspecified site, episode of care unspecified 2005   Acute pancreatitis    CAD (coronary artery disease)    a. CABG in 2005 w LIMA to LAD, left radial to second circumflex marginal, saphenous vein graft to PDA, saphenous vein graft to lateral subbrach of ramus intermediate, and sequential saphenous vein graft to the medial subbranch of ramus intermediate.   Chronic diastolic CHF (congestive heart failure) (HCC)    Cirrhosis of liver without mention of alcohol    CKD (chronic kidney disease), stage II    Complications affecting other specified body systems, hypertension    Ectopic atrial rhythm    Essential hypertension    Hyperlipidemia    Hypokalemia    a. intermitttent noncompliance with potassium supplement.   Hypomagnesemia    Morbid obesity (HCC)    Neuropathy    Other and unspecified hyperlipidemia    Proteinuria    RBBB    Sleep apnea    Type II or unspecified type diabetes mellitus without mention of complication, not stated as uncontrolled    Varicose veins of both lower extremities    Past Surgical History:  Procedure  Laterality Date   CORONARY ARTERY BYPASS GRAFT     2005   I & D EXTREMITY Left 09/02/2019   Procedure: IRRIGATION AND DEBRIDEMENT 4th TOE DEBRIDIMENT;  Surgeon: Evelina Bucy, DPM;  Location: [facility];  Service: Podiatry;  Laterality: Left;     A IV Location/Drains/Wounds Patient Lines/Drains/Airways Status     Active Line/Drains/Airways     Name Placement date Placement time Site Days   Peripheral IV 04/12/22 18 G Right Antecubital 04/12/22  1340  Antecubital  less than 1   Incision (Closed) 09/02/19 Foot Left 09/02/19  1353  -- 953            Intake/Output Last 24 hours No intake or output data in the 24 hours ending 04/12/22 2024  Labs/Imaging Results for orders placed or performed during the hospital encounter of 04/12/22 (from the past 48 hour(s))  CBG monitoring, ED     Status: Abnormal   Collection Time: 04/12/22  1:28 PM  Result Value Ref Range   Glucose-Capillary 230 (H) 70 - 99 mg/dL    Comment: Glucose reference range applies only to samples taken after fasting for at least 8 hours.  I-stat chem 8, ED     Status: Abnormal   Collection Time: 04/12/22  1:41 PM  Result Value Ref Range   Sodium 140 135 - 145 mmol/L   Potassium 2.6 (LL) 3.5 - 5.1 mmol/L  Chloride 95 (L) 98 - 111 mmol/L   BUN 19 8 - 23 mg/dL   Creatinine, Ser 1.40 (H) 0.61 - 1.24 mg/dL   Glucose, Bld 251 (H) 70 - 99 mg/dL    Comment: Glucose reference range applies only to samples taken after fasting for at least 8 hours.   Calcium, Ion 1.02 (L) 1.15 - 1.40 mmol/L   TCO2 28 22 - 32 mmol/L   Hemoglobin 15.6 13.0 - 17.0 g/dL   HCT 46.0 39.0 - 52.0 %   Comment NOTIFIED PHYSICIAN   Protime-INR     Status: None   Collection Time: 04/12/22  1:54 PM  Result Value Ref Range   Prothrombin Time 14.9 11.4 - 15.2 seconds   INR 1.2 0.8 - 1.2    Comment: (NOTE) INR goal varies based on device and disease states. Performed at [facility], [place] [address], [place], [place] [address]   APTT      Status: None   Collection Time: 04/12/22  1:54 PM  Result Value Ref Range   aPTT 33 24 - 36 seconds    Comment: Performed at [facility] [address] [place], [place] [address]  CBC     Status: Abnormal   Collection Time: 04/12/22  1:54 PM  Result Value Ref Range   WBC 6.7 4.0 - 10.5 K/uL   RBC 4.21 (L) 4.22 - 5.81 MIL/uL   Hemoglobin 14.6 13.0 - 17.0 g/dL   HCT 41.9 39.0 - 52.0 %   MCV 99.5 80.0 - 100.0 fL   MCH 34.7 (H) 26.0 - 34.0 pg   MCHC 34.8 30.0 - 36.0 g/dL   RDW 15.3 11.5 - 15.5 %   Platelets 198 150 - 400 K/uL   nRBC 0.0 0.0 - 0.2 %    Comment: Performed at [facility], [place] [address], [place], [place] [address]  Differential     Status: None   Collection Time: 04/12/22  1:54 PM  Result Value Ref Range   Neutrophils Relative % 42 %   Neutro Abs 2.8 1.7 - 7.7 K/uL   Lymphocytes Relative 43 %   Lymphs Abs 3.0 0.7 - 4.0 K/uL   Monocytes Relative 11 %   Monocytes Absolute 0.7 0.1 - 1.0 K/uL   Eosinophils Relative 3 %   Eosinophils Absolute 0.2 0.0 - 0.5 K/uL   Basophils Relative 1 %   Basophils Absolute 0.1 0.0 - 0.1 K/uL   Immature Granulocytes 0 %   Abs Immature Granulocytes 0.01 0.00 - 0.07 K/uL    Comment: Performed at [facility] [address], [place], [place] [address]  Comprehensive metabolic panel     Status: Abnormal   Collection Time: 04/12/22  1:54 PM  Result Value Ref Range   Sodium 136 135 - 145 mmol/L   Potassium 2.7 (LL) 3.5 - 5.1 mmol/L    Comment: CRITICAL RESULT CALLED TO, READ BACK BY AND VERIFIED WITH L,Aaron Bostwick RN '@1449'$  04/12/22 E,[place]   Chloride 97 (L) 98 - 111 mmol/L   CO2 29 22 - 32 mmol/L   Glucose, Bld 251 (H) 70 - 99 mg/dL    Comment: Glucose reference range applies only to samples taken after fasting for at least 8 hours.   BUN 17 8 - 23 mg/dL   Creatinine, Ser 1.50 (H) 0.61 - 1.24 mg/dL   Calcium 8.6 (L) 8.9 - 10.3 mg/dL   Total Protein 8.6 (H) 6.5 - 8.1 g/dL  Albumin 3.2 (L) 3.5 - 5.0 g/dL   AST 22 15 - 41  U/L   ALT 12 0 - 44 U/L   Alkaline Phosphatase 105 38 - 126 U/L   Total Bilirubin 1.0 0.3 - 1.2 mg/dL   GFR, Estimated 50 (L) >60 mL/min    Comment: (NOTE) Calculated using the CKD-EPI Creatinine Equation (2021)    Anion gap 10 5 - 15    Comment: Performed at [facility] [address] [place], [place] [address]  Ethanol     Status: None   Collection Time: 04/12/22  1:55 PM  Result Value Ref Range   Alcohol, Ethyl (B) <10 <10 mg/dL    Comment: (NOTE) Lowest detectable limit for serum alcohol is 10 mg/dL.  For medical purposes only. Performed at [facility], [place] [address] [place], [place] [address]   Magnesium     Status: Abnormal   Collection Time: 04/12/22  2:06 PM  Result Value Ref Range   Magnesium 1.5 (L) 1.7 - 2.4 mg/dL    Comment: Performed at [facility] [address] [place], [place] [address]   MR Brain Wo Contrast (neuro protocol)  Result Date: 04/12/2022 CLINICAL DATA:  Slurred speech. EXAM: MRI HEAD WITHOUT CONTRAST TECHNIQUE: Multiplanar, multiecho pulse sequences of the brain and surrounding structures were obtained without intravenous contrast. COMPARISON:  Same-day CT head FINDINGS: Brain: There is no acute intracranial hemorrhage, extra-axial fluid collection, or acute infarct. Parenchymal volume is normal for age. The ventricles are normal in size. There is mild background chronic small-vessel ischemic change. The pituitary and suprasellar region are normal. There is no mass lesion. There is no mass effect or midline shift. Vascular: Normal flow voids. Skull and upper cervical spine: Normal marrow signal. Sinuses/Orbits: There is mucosal thickening in the right sphenoid sinus. The globes and orbits are unremarkable. Other: None. IMPRESSION: Unremarkable for age brain MRI with no acute intracranial pathology. Electronically Signed   By: Valetta Mole M.D.   On: 04/12/2022 19:02   CT HEAD WO CONTRAST  Result Date: 04/12/2022 CLINICAL DATA:   Neuro deficit, acute, stroke suspected EXAM: CT HEAD WITHOUT CONTRAST TECHNIQUE: Contiguous axial images were obtained from the base of the skull through the vertex without intravenous contrast. RADIATION DOSE REDUCTION: This exam was performed according to the departmental dose-optimization program which includes automated exposure control, adjustment of the mA and/or kV according to patient size and/or use of iterative reconstruction technique. COMPARISON:  None Available. FINDINGS: Brain: There is periventricular white matter decreased attenuation consistent with small vessel ischemic changes. Ventricles, sulci and cisterns are prominent consistent with age related involutional changes. No acute intracranial hemorrhage, mass effect or shift. No hydrocephalus. Vascular: No hyperdense vessel or unexpected calcification. Skull: Normal. Negative for fracture or focal lesion. Sinuses/Orbits: No acute finding. IMPRESSION: Atrophy and chronic small vessel ischemic changes. No acute intracranial process identified. Electronically Signed   By: Sammie Bench M.D.   On: 04/12/2022 16:39    Pending Labs Unresulted Labs (From admission, onward)    None       Vitals/Pain Today's Vitals   04/12/22 1330 04/12/22 1341 04/12/22 1723 04/12/22 1726  BP: 117/63  (!) 148/135   Pulse: 77  (!) 59   Resp: 18  18   Temp:  98.4 F (36.9 C)  98 F (36.7 C)  TempSrc:  Oral    SpO2: 96%  95%   Weight:      Height:  PainSc:        Isolation Precautions No active isolations  Medications Medications  sodium chloride flush (NS) 0.9 % injection 3 mL (3 mLs Intravenous Not Given 04/12/22 1345)  potassium chloride 10 mEq in 100 mL IVPB (has no administration in time range)  potassium chloride 10 mEq in 100 mL IVPB (0 mEq Intravenous Stopped 04/12/22 2004)  potassium chloride SA (KLOR-CON M) CR tablet 40 mEq (40 mEq Oral Given 04/12/22 1423)  magnesium sulfate IVPB 2 g 50 mL (0 g Intravenous Stopped 04/12/22 1705)   LORazepam (ATIVAN) injection 1 mg (1 mg Intravenous Given 04/12/22 1759)    Mobility walks     Focused Assessments Neuro Assessment Handoff:  Swallow screen pass? Yes    NIH Stroke Scale  Dizziness Present: No Headache Present: Yes Interval: Initial Level of Consciousness (1a.)   : Alert, keenly responsive LOC Questions (1b. )   : Answers both questions correctly LOC Commands (1c. )   : Performs both tasks correctly Best Gaze (2. )  : Normal Visual (3. )  : No visual loss Facial Palsy (4. )    : Partial paralysis  Motor Arm, Left (5a. )   : No drift Motor Arm, Right (5b. ) : No drift Motor Leg, Left (6a. )  : No drift Motor Leg, Right (6b. ) : No drift Limb Ataxia (7. ): Absent Sensory (8. )  : Normal, no sensory loss Best Language (9. )  : Severe aphasia Dysarthria (10. ): Mild-to-moderate dysarthria, patient slurs at least some words and, at worst, can be understood with some difficulty Extinction/Inattention (11.)   : No Abnormality Complete NIHSS TOTAL: 5     Neuro Assessment:   Neuro Checks:   Initial (04/12/22 1316)  Has TPA been given? No If patient is a Neuro Trauma and patient is going to OR before floor call report to [facility] nurse: [phone] or [phone]   R Recommendations: See Admitting Provider Note  Report given to:   Additional Notes:

## 2022-04-12 NOTE — ED Triage Notes (Signed)
Pt c/o R sided facial droop, mostly R sided tongue numbness and R sided headache since yesterday morning.  Per ems:  Bp 134/88 Hr 80 Sats 96% Cbg 324  Pt ao x 4.

## 2022-04-12 NOTE — Discharge Instructions (Addendum)
It was our pleasure to provide your ER care today - we hope that you feel better.  Your potassium level is low  - eat plenty of fruits and vegetables, take potassium supplement as prescribed, and follow up with primary care doctor for recheck in one week.   Use saline eye drops or similar lubricating eye drops several times per day to prevent dryness and irritation to right eye.   Prednisone has been prescribed and sent to your pharmacy.  Take as prescribed for the next week.  Follow-up with your primary care doctor.  Return to ER right away if worse, new symptoms, fevers, new/severe pain, one-sided numbness/weakness, change in speech or vision or other concern.

## 2022-04-12 NOTE — ED Notes (Signed)
Transported to CT 

## 2022-04-12 NOTE — ED Notes (Signed)
Patient black from CT

## 2022-04-12 NOTE — ED Provider Notes (Signed)
Care of patient assumed from Dr. Ashok Cordia.  This patient presents with strokelike symptoms since yesterday.  Symptoms include right-sided facial weakness, including forehead and slurred speech.  Lab work is notable for hypokalemia and hypomagnesemia.  He is getting replacement electrolytes.  MRI has been ordered. Physical Exam  BP 117/63   Pulse 77   Temp 98.4 F (36.9 C) (Oral)   Resp 18   Ht '5\' 11"'$  (1.803 m)   Wt 135.2 kg   SpO2 96%   BMI 41.56 kg/m   Physical Exam Vitals and nursing note reviewed.  Constitutional:      General: He is not in acute distress.    Appearance: Normal appearance. He is well-developed. He is not ill-appearing.  HENT:     Head: Normocephalic and atraumatic.     Right Ear: External ear normal.     Left Ear: External ear normal.     Nose: Nose normal.     Mouth/Throat:     Mouth: Mucous membranes are moist.  Eyes:     Extraocular Movements: Extraocular movements intact.     Conjunctiva/sclera: Conjunctivae normal.  Cardiovascular:     Rate and Rhythm: Normal rate and regular rhythm.  Pulmonary:     Effort: Pulmonary effort is normal. No respiratory distress.  Abdominal:     Palpations: Abdomen is soft.  Musculoskeletal:        General: No swelling. Normal range of motion.     Cervical back: Normal range of motion and neck supple.  Skin:    General: Skin is warm and dry.     Coloration: Skin is not jaundiced or pale.  Neurological:     Mental Status: He is alert.     Cranial Nerves: Facial asymmetry (With forehead involvement) present.  Psychiatric:        Mood and Affect: Mood normal.     Procedures  Procedures  ED Course / MDM    Medical Decision Making Amount and/or Complexity of Data Reviewed Labs: ordered. Radiology: ordered.  Risk Prescription drug management.   On assessment, patient is resting comfortably.  He continues to have right-sided facial droop with involvement of forehead consistent with Bell's palsy.  MRI did not  show any acute findings.  Patient was informed of his electrolyte disturbances.  He states that he is prescribed 2 tablets of potassium supplement 3 times daily.  Recently, he has been only taking this once a day.  Patient was advised to resume medications as prescribed and to follow-up for repeat lab work to ensure normalization of electrolyte levels.  Prednisone was prescribed.  He was discharged in stable condition.       Godfrey Pick, MD 04/12/22 [phone]

## 2022-04-12 NOTE — ED Provider Notes (Signed)
[facility] Provider Note   CSN: [id number] Arrival date & time: 04/12/22  1316     History  Chief Complaint  Patient presents with   Facial Droop   Aphasia    [name] [name] is a 66 y.o. male.  Patient c/o slurred speech symptoms onset when he woke up yesterday AM.  Symptoms persistent since onset, seems worse today. Denies hx same. Denies facial numbness. Pt with right facial weakness, pt was unaware, although indicate had noted occasional drooling from right side of mouth in past day. No headache. No change in vision. Denies extremity numbness/weakness, problems w gait, or loss of normal functional ability. No ear pain, hearing loss,  or tinnitus. No uri symptoms. No fever or chills.   The history is provided by the patient, the EMS personnel and medical records.       Home Medications Prior to Admission medications   Medication Sig Start Date End Date Taking? Authorizing Provider  aspirin EC 81 MG tablet Take 81 mg by mouth daily.    [provider]  B-D ULTRAFINE III SHORT PEN 31G X 8 MM MISC  03/11/16   [provider]  benzonatate (TESSALON) 200 MG capsule Take 1 capsule (200 mg total) by mouth 2 (two) times daily as needed for cough. 03/25/22   [name], MD  chlorhexidine (HIBICLENS) 4 % external liquid Apply topically daily as needed. 02/12/21   [name], MD  collagenase (SANTYL) ointment Apply 1 application topically daily. Right ankle measures 6.0 x 0.3cm. 07/15/19   Evelina Bucy, DPM  diclofenac Sodium (VOLTAREN) 1 % GEL APPLY 2 GRAMS TOPICALLY 4 TIMES DAILY TO KNEE 05/27/21   [name], MD  eplerenone (INSPRA) 25 MG tablet TAKE 1 TABLET(25 MG) BY MOUTH DAILY 10/21/21   [name], MD  famotidine (PEPCID) 20 MG tablet TAKE 1 TABLET(20 MG) BY MOUTH DAILY 11/13/21   [name], MD  fluticasone (FLONASE) 50 MCG/ACT nasal spray Place 1 spray into both nostrils daily. 05/27/21   [name], MD  Fluticasone-Umeclidin-Vilant (TRELEGY ELLIPTA) 100-62.5-25 MCG/ACT AEPB Inhale 1 puff into the lungs daily. 09/30/21   Young, Tarri Fuller D, MD  glucose blood (ONETOUCH ULTRA) test strip TEST AS DIRECTED 8 TIMES DAILY 01/31/22   Allwardt, Alyssa M, PA-C  HUMALOG 100 UNIT/ML injection Inject into the skin. 07/16/21   [provider]  HUMALOG KWIKPEN 200 UNIT/ML KwikPen Inject into the skin. 05/21/21   [provider]  HUMULIN R U-500 KWIKPEN 500 UNIT/ML kwikpen Inject 50-60 Units into the skin See admin instructions. 60 units in the AM, 50 units at lunch and 50 in the evening 01/12/20   [name], MD  ipratropium-albuterol (DUONEB) 0.5-2.5 (3) MG/3ML SOLN 1 vial in nebulizer up to 4 times daily 11/13/20   Deneise Lever, MD  Lancets (ACCU-CHEK MULTICLIX) lancets  02/15/16   [provider]  LANTUS SOLOSTAR 100 UNIT/ML Solostar Pen Inject 55 Units into the skin every morning. 09/22/19   [provider]  lidocaine (LIDODERM) 5 % Place 1 patch onto the skin daily. Remove and Discard patch within 12 hours or as directed by MD 08/27/21   Belenda Cruise, Karie Schwalbe, NP  losartan (COZAAR) 50 MG tablet TAKE 1/2 TABLET(25 MG) BY MOUTH DAILY 04/07/22   [name], MD  MAGNESIUM-OXIDE 400 (241.3 Mg) MG tablet Take 1 tablet by mouth 2 (two) times daily. 10/06/19   [provider]  meloxicam (MOBIC) 15 MG tablet Take 1 tablet (15 mg total) by mouth daily. 10/09/21   Glennon Mac, DO  metoprolol tartrate (LOPRESSOR) 50 MG tablet TAKE 1 TABLET(50 MG) BY MOUTH TWICE DAILY 08/14/21   [name], MD  nitroGLYCERIN (NITROSTAT) 0.4 MG SL tablet Place 1 tablet (0.4 mg total) under the tongue every 5 (five) minutes as needed for chest pain. 08/21/21   [name], MD  nystatin ointment (MYCOSTATIN) Apply 1 application topically 2 (two) times daily. 10/31/19   [name], MD  ofloxacin (OCUFLOX) 0.3 % ophthalmic solution Place 1 drop into the right eye 4 (four) times  daily. 07/16/21   [provider]  polyethylene glycol (MIRALAX / GLYCOLAX) 17 g packet Take 17 g by mouth daily. 09/03/19   Bonnielee Haff, MD  potassium chloride SA (KLOR-CON M) 20 MEQ tablet TAKE 2 TABLETS(40 MEQ) BY MOUTH THREE TIMES DAILY 01/09/22   [name], MD  pregabalin (LYRICA) 50 MG capsule Take 1 capsule (50 mg total) by mouth at bedtime as needed (nerve pain). 03/25/21   Gregor Hams, MD  PROAIR HFA 108 [phone] Base) MCG/ACT inhaler Inhale 2 puffs into the lungs every 6 (six) hours as needed for wheezing or shortness of breath. 09/05/21   Cobb, Karie Schwalbe, NP  rosuvastatin (CRESTOR) 20 MG tablet Take 1 tablet (20 mg total) by mouth every evening. 05/27/21   [name], MD  silver sulfADIAZINE (SSD) 1 % cream APPLY PEA SIZED AMOUNT TOPICALLY TO WOUND DAILY 01/07/21   [name], MD  torsemide (DEMADEX) 20 MG tablet TAKE 1 TABLET(20 MG) BY MOUTH TWICE DAILY 08/14/21   [name], MD      Allergies    Empagliflozin and Ibuprofen    Review of Systems   Review of Systems  Constitutional:  Negative for chills and fever.  HENT:  Negative for ear pain, hearing loss and sore throat.   Eyes:  Negative for pain, redness and visual disturbance.  Respiratory:  Negative for cough and shortness of breath.   Cardiovascular:  Negative for chest pain.  Gastrointestinal:  Negative for abdominal pain, nausea and vomiting.  Genitourinary:  Negative for flank pain.  Musculoskeletal:  Negative for back pain and neck pain.  Skin:  Negative for rash.  Neurological:  Positive for speech difficulty. Negative for numbness and headaches.  Hematological:  Does not bruise/bleed easily.  Psychiatric/Behavioral:  Negative for confusion.     Physical Exam Updated Vital Signs BP 117/63   Pulse 77   Temp 98.4 F (36.9 C) (Oral)   Resp 18   Ht 1.803 m ('5\' 11"'$ )   Wt 135.2 kg   SpO2 96%   BMI 41.56 kg/m  Physical Exam Vitals and nursing note reviewed.  Constitutional:       Appearance: Normal appearance. He is well-developed.  HENT:     Head: Atraumatic.     Right Ear: Tympanic membrane, ear canal and external ear normal.     Nose: Nose normal.     Mouth/Throat:     Mouth: Mucous membranes are moist.     Pharynx: Oropharynx is clear.  Eyes:     General: No scleral icterus.    Extraocular Movements: Extraocular movements intact.     Conjunctiva/sclera: Conjunctivae normal.     Pupils: Pupils are equal, round, and reactive to light.  Neck:     Vascular: No carotid bruit.     Trachea: No tracheal deviation.  Cardiovascular:     Rate and Rhythm: Normal rate and regular rhythm.     Pulses: Normal pulses.     Heart sounds: Normal heart sounds. No murmur heard.    No friction rub. No gallop.  Pulmonary:     Effort: Pulmonary effort is normal. No accessory muscle usage or respiratory distress.     Breath sounds: Normal breath sounds.  Abdominal:     General: There is no distension.     Palpations: Abdomen is soft.     Tenderness: There is no abdominal tenderness.  Musculoskeletal:        General: No swelling.     Cervical back: Normal range of motion and neck supple. No rigidity.  Skin:    General: Skin is warm and dry.     Findings: No rash.  Neurological:     Mental Status: He is alert.     Comments: Alert, speech mildly dysarthric. No aphasia.  Right facial weakness, equivocal as to whether involves forehead. Motor fxn intact bil ext, stre 5/5. No pronator drift. Sensation grossly intact bil.   Psychiatric:        Mood and Affect: Mood normal.     ED Results / Procedures / Treatments   Labs (all labs ordered are listed, but only abnormal results are displayed) Results for orders placed or performed during the hospital encounter of 04/12/22  CBC  Result Value Ref Range   WBC 6.7 4.0 - 10.5 K/uL   RBC 4.21 (L) 4.22 - 5.81 MIL/uL   Hemoglobin 14.6 13.0 - 17.0 g/dL   HCT 41.9 39.0 - 52.0 %   MCV 99.5 80.0 - 100.0 fL   MCH 34.7 (H) 26.0 - 34.0  pg   MCHC 34.8 30.0 - 36.0 g/dL   RDW 15.3 11.5 - 15.5 %   Platelets 198 150 - 400 K/uL   nRBC 0.0 0.0 - 0.2 %  Differential  Result Value Ref Range   Neutrophils Relative % 42 %   Neutro Abs 2.8 1.7 - 7.7 K/uL   Lymphocytes Relative 43 %   Lymphs Abs 3.0 0.7 - 4.0 K/uL   Monocytes Relative 11 %   Monocytes Absolute 0.7 0.1 - 1.0 K/uL   Eosinophils Relative 3 %   Eosinophils Absolute 0.2 0.0 - 0.5 K/uL   Basophils Relative 1 %   Basophils Absolute 0.1 0.0 - 0.1 K/uL   Immature Granulocytes 0 %   Abs Immature Granulocytes 0.01 0.00 - 0.07 K/uL  I-stat chem 8, ED  Result Value Ref Range   Sodium 140 135 - 145 mmol/L   Potassium 2.6 (LL) 3.5 - 5.1 mmol/L   Chloride 95 (L) 98 - 111 mmol/L   BUN 19 8 - 23 mg/dL   Creatinine, Ser 1.40 (H) 0.61 - 1.24 mg/dL   Glucose, Bld 251 (H) 70 - 99 mg/dL   Calcium, Ion 1.02 (L) 1.15 - 1.40 mmol/L   TCO2 28 22 - 32 mmol/L   Hemoglobin 15.6 13.0 - 17.0 g/dL   HCT 46.0 39.0 - 52.0 %   Comment NOTIFIED PHYSICIAN   CBG monitoring, ED  Result Value Ref Range   Glucose-Capillary 230 (H) 70 - 99 mg/dL     EKG EKG Interpretation  Date/Time:  Saturday April 12 2022 13:20:37 EST Ventricular Rate:  83 PR Interval:  196 QRS Duration: 124 QT Interval:  486 QTC Calculation: 571 R Axis:   -59 Text Interpretation: Sinus rhythm with Premature atrial complexes Premature ventricular complexes Right  bundle branch block Left anterior fascicular block Non-specific ST-t changes Confirmed by Lajean Saver [phone]) on 04/12/2022 1:38:18 PM  Radiology No results found.  Procedures Procedures    Medications Ordered in ED Medications  sodium chloride flush (NS) 0.9 % injection 3 mL (3 mLs Intravenous Not Given 04/12/22 1345)  potassium chloride 10 mEq in 100 mL IVPB (has no administration in time range)  potassium chloride SA (KLOR-CON M) CR tablet 40 mEq (has no administration in time range)    ED Course/ Medical Decision Making/ A&P                              Medical Decision Making Problems Addressed: Facial weakness: acute illness or injury with systemic symptoms that poses a threat to life or bodily functions Hypokalemia: acute illness or injury with systemic symptoms that poses a threat to life or bodily functions Hypomagnesemia: acute illness or injury that poses a threat to life or bodily functions Slurred speech: acute illness or injury with systemic symptoms that poses a threat to life or bodily functions Stage 3a chronic kidney disease ([place]): chronic illness or injury that poses a threat to life or bodily functions  Amount and/or Complexity of Data Reviewed Independent Historian: EMS    Details: hx External Data Reviewed: labs and notes. Labs: ordered. Radiology: ordered. ECG/medicine tests: ordered and independent interpretation performed. Decision-making details documented in ED Course.  Risk Prescription drug management. Decision regarding hospitalization.   Iv ns. Continuous pulse ox and cardiac monitoring. Labs ordered/sent. Imaging ordered.   Differential diagnosis includes CVA, Bells palsy, etc. Dispo decision including potential need for admission considered - will get labs and imaging and reassess.   Reviewed nursing notes and prior charts for additional history. External reports reviewed. Additional hx by EMS.   Cardiac monitor: sinus rhythm, rate 78.  Labs reviewed/interpreted by me - k low. Kcl iv and po. Mg added to labs.   CT reviewed/interpreted by me - no hem.  1550, mri pending. Signed out to Dr Doren Custard to check MRI when resulted and dispo appropriately.           Final Clinical Impression(s) / ED Diagnoses Final diagnoses:  None    Rx / DC Orders ED Discharge Orders     None         Lajean Saver, MD 04/12/22 1552

## 2022-04-13 DIAGNOSIS — G510 Bell's palsy: Secondary | ICD-10-CM | POA: Diagnosis not present

## 2022-04-13 LAB — CBG MONITORING, ED: Glucose-Capillary: 138 mg/dL — ABNORMAL HIGH (ref 70–99)

## 2022-04-13 MED ORDER — LOSARTAN POTASSIUM 50 MG PO TABS
25.0000 mg | ORAL_TABLET | Freq: Once | ORAL | Status: AC
  Administered 2022-04-13: 25 mg via ORAL
  Filled 2022-04-13: qty 1

## 2022-04-14 ENCOUNTER — Other Ambulatory Visit: Payer: Self-pay

## 2022-04-14 DIAGNOSIS — I10 Essential (primary) hypertension: Secondary | ICD-10-CM

## 2022-04-14 MED ORDER — LOSARTAN POTASSIUM 50 MG PO TABS: ORAL_TABLET | ORAL | 3 refills | Status: DC

## 2022-04-14 NOTE — Telephone Encounter (Signed)
Pt's medication was sent to his pharmacy as requested. Confirmation received.

## 2022-04-15 ENCOUNTER — Encounter: Payer: Self-pay | Admitting: Family Medicine

## 2022-04-15 ENCOUNTER — Ambulatory Visit (INDEPENDENT_AMBULATORY_CARE_PROVIDER_SITE_OTHER): Payer: 59 | Admitting: Family Medicine

## 2022-04-15 VITALS — BP 115/73 | HR 67 | Temp 97.7°F | Ht 71.0 in | Wt 304.6 lb

## 2022-04-15 DIAGNOSIS — Z794 Long term (current) use of insulin: Secondary | ICD-10-CM

## 2022-04-15 DIAGNOSIS — H9190 Unspecified hearing loss, unspecified ear: Secondary | ICD-10-CM | POA: Diagnosis not present

## 2022-04-15 DIAGNOSIS — E876 Hypokalemia: Secondary | ICD-10-CM | POA: Diagnosis not present

## 2022-04-15 DIAGNOSIS — I10 Essential (primary) hypertension: Secondary | ICD-10-CM | POA: Diagnosis not present

## 2022-04-15 DIAGNOSIS — G510 Bell's palsy: Secondary | ICD-10-CM | POA: Diagnosis not present

## 2022-04-15 DIAGNOSIS — E1169 Type 2 diabetes mellitus with other specified complication: Secondary | ICD-10-CM

## 2022-04-15 DIAGNOSIS — R059 Cough, unspecified: Secondary | ICD-10-CM

## 2022-04-15 LAB — COMPREHENSIVE METABOLIC PANEL
ALT: 12 U/L (ref 0–53)
AST: 22 U/L (ref 0–37)
Albumin: 3.4 g/dL — ABNORMAL LOW (ref 3.5–5.2)
Alkaline Phosphatase: 116 U/L (ref 39–117)
BUN: 26 mg/dL — ABNORMAL HIGH (ref 6–23)
CO2: 27 mEq/L (ref 19–32)
Calcium: 9 mg/dL (ref 8.4–10.5)
Chloride: 100 mEq/L (ref 96–112)
Creatinine, Ser: 1.58 mg/dL — ABNORMAL HIGH (ref 0.40–1.50)
GFR: 44.75 mL/min — ABNORMAL LOW (ref >60.00)
Glucose, Bld: 220 mg/dL — ABNORMAL HIGH (ref 70–99)
Potassium: 3.7 mEq/L (ref 3.5–5.1)
Sodium: 137 mEq/L (ref 135–145)
Total Bilirubin: 1 mg/dL (ref 0.2–1.2)
Total Protein: 7.8 g/dL (ref 6.0–8.3)

## 2022-04-15 LAB — CBC
HCT: 41.1 % (ref 39.0–52.0)
Hemoglobin: 14.1 g/dL (ref 13.0–17.0)
MCHC: 34.4 g/dL (ref 30.0–36.0)
MCV: 103.2 fl — ABNORMAL HIGH (ref 78.0–100.0)
Platelets: 212 10*3/uL (ref 150.0–400.0)
RBC: 3.98 Mil/uL — ABNORMAL LOW (ref 4.22–5.81)
RDW: 16.7 % — ABNORMAL HIGH (ref 11.5–15.5)
WBC: 6.7 10*3/uL (ref 4.0–10.5)

## 2022-04-15 MED ORDER — HYPROMELLOSE (GONIOSCOPIC) 2.5 % OP SOLN: 2.0000 [drp] | OPHTHALMIC | 12 refills | Status: AC | PRN

## 2022-04-15 NOTE — Assessment & Plan Note (Signed)
At goal on current regimen per cardiology including eplerenone 25 mg daily, losartan 25 mg daily, metoprolol tartrate 50 mg twice daily, and torsemide 20 mg daily.  Will be rechecking c-Met today as above due to hypokalemia that was found in the ED.  He has been taking his potassium supplementation as prescribed.

## 2022-04-15 NOTE — Assessment & Plan Note (Signed)
Will refer to audiology. 

## 2022-04-15 NOTE — Progress Notes (Signed)
   KODAH WINGARD is a 67 y.o. male who presents today for an office visit.  Assessment/Plan:  New/Acute Problems: Bells Palsy It is reassuring that symptoms have been improving the last few days.  He will complete his course of prednisone.  We discussed typical course of illness and that he should have improvement over the next several weeks.  We discussed reasons to return to care.  Follow-up as needed.  Hypokalemia He has been compliant with potassium supplementation.  Will recheck labs today.  Chronic Problems Addressed Today: Type 2 diabetes mellitus with other specified complication ([place]), followed by Dr. Donn Pierini is up quite a bit since being on the prednisone.  Advised him to schedule or call his endocrinologist soon to discuss management.  Cough Advised him to schedule appointment with pulmonology soon.  Advised him we would not be able to refill any codeine containing cough syrup.  HTN (hypertension) At goal on current regimen per cardiology including eplerenone 25 mg daily, losartan 25 mg daily, metoprolol tartrate 50 mg twice daily, and torsemide 20 mg daily.  Will be rechecking c-Met today as above due to hypokalemia that was found in the ED.  He has been taking his potassium supplementation as prescribed.  Decreased hearing Will refer to audiology.     Subjective:  HPI:  Patient here for ED follow-up.  Went to ED 3 days ago with sudden onset slurred speech for a day.  He was found to have low potassium and was given IV repletion.  CT scan was negative.  There was concern for possible stroke and MRI was ordered.  This was negative for any acute intracranial pathology.  Patient was diagnosed with potential Bell's palsy and was started on prednisone.  Symptoms have improved somewhat over the last couple of days.  He has been compliant with his prednisone.  No other episodes of weakness or numbness.  He has been taping his eye shut but still notes that his eye is pretty  irritated from being able to close it completely.  His sugars have been running up a little bit since being on prednisone.  See A/p for status of chronic conditions that we discussed today.       Objective:  Physical Exam: BP 115/73   Pulse 67   Temp 97.7 F (36.5 C) (Temporal)   Ht '5\' 11"'$  (1.803 m)   Wt (!) 304 lb 9.6 oz (138.2 kg)   SpO2 96%   BMI 42.48 kg/m   Gen: No acute distress, resting comfortably CV: Regular rate and rhythm with no murmurs appreciated Pulm: Normal work of breathing, clear to auscultation bilaterally with no crackles, wheezes, or rhonchi Neuro: Right 7th cranial nerve palsy noted.  Otherwise cranial nerves II through XII intact.  Sensation light touch intact throughout.  Strength normal throughout. Psych: Normal affect and thought content  Time Spent: 40 minutes of total time was spent on the date of the encounter performing the following actions: chart review prior to seeing the patient including recent ED visit, obtaining history, performing a medically necessary exam, counseling on the treatment plan, placing orders, and documenting in our EHR.        Algis Greenhouse. [name], MD 04/15/2022 11:58 AM

## 2022-04-15 NOTE — Patient Instructions (Signed)
It was very nice to see you today!  You had Bell's palsy.  This is usually due to a virus.  Please finish your prednisone.  Your symptoms should improve over the next several weeks.  We will check blood work today.  No other medication changes today.  Please call to schedule an appointment with your pulmonologist soon.  Please keep an eye on your blood sugar and discuss with endocrinology if it is running high.  Take care, Dr [name]  PLEASE NOTE:  If you had any lab tests, please let us know if you have not heard back within a few days. You may see your results on mychart before we have a chance to review them but we will give you a call once they are reviewed by [place].   If we ordered any referrals today, please let us know if you have not heard from their office within the next week.   If you had any urgent prescriptions sent in today, please check with the pharmacy within an hour of our visit to make sure the prescription was transmitted appropriately.   Please try these tips to maintain a healthy lifestyle:  Eat at least 3 REAL meals and 1-2 snacks per day.  Aim for no more than 5 hours between eating.  If you eat breakfast, please do so within one hour of getting up.   Each meal should contain half fruits/vegetables, one quarter protein, and one quarter carbs (no bigger than a computer mouse)  Cut down on sweet beverages. This includes juice, soda, and sweet tea.   Drink at least 1 glass of water with each meal and aim for at least 8 glasses per day  Exercise at least 150 minutes every week.    Bell's Palsy, Adult  Bell's palsy is a short-term inability to move muscles in a part of the face. The inability to move, also called paralysis, results from inflammation or compression of the seventh cranial nerve. This nerve travels along the skull and under the ear to the side of the face. This nerve is responsible for facial movements that include blinking, closing the eyes, smiling,  and frowning. What are the causes? The exact cause of this condition is not known. It may be caused by an infection from a virus, such as the chickenpox (herpes zoster), Epstein-Barr, or mumps virus. What increases the risk? You are more likely to develop this condition if: You are pregnant. You have diabetes. You have had a recent infection in your nose, throat, or airways. You have a weakened body defense system (immune system). You have had a facial injury, such as a fracture. You have a family history of Bell's palsy. What are the signs or symptoms? Symptoms of this condition include: Weakness on one side of the face. Drooping eyelid and corner of the mouth. Excessive tearing in one eye. Difficulty closing the eyelid. Dry eye. Drooling. Dry mouth. Changes in taste. Change in facial appearance. Pain behind one ear. Ringing in one or both ears. Sensitivity to sound in one ear. Facial twitching. Headache. Impaired speech. Dizziness. Difficulty eating or drinking. Most of the time, only one side of the face is affected. In rare cases, Bell's palsy may affect the whole face. How is this diagnosed? This condition is diagnosed based on: Your symptoms. Your medical history. A physical exam. You may also have to see health care providers who specialize in disorders of the nerves (neurologist) or diseases and conditions of the eye (ophthalmologist). You  may have tests, such as: A test to check for nerve damage (electromyogram). Imaging studies, such as a CT scan or an MRI. Blood tests. How is this treated? This condition affects every person differently. Sometimes symptoms go away without treatment within a couple weeks. If treatment is needed, it varies from person to person. The goal of treatment is to reduce inflammation and protect the eye from damage. Treatment for Bell's palsy may include: Medicines, such as: Steroids to reduce swelling and inflammation. Antiviral  medicines. Pain relievers, including aspirin, acetaminophen, or ibuprofen. Eye drops or ointment to keep your eye moist. Eye protection, if you cannot close your eye. Exercises or massage to regain muscle strength and function (physical therapy). Follow these instructions at home:  Take over-the-counter and prescription medicines only as told by your health care provider. If your eye is affected: Keep your eye moist with eye drops or ointment as told by your health care provider. Follow instructions for eye care and protection as told by your health care provider. Do any physical therapy exercises as told by your health care provider. Keep all follow-up visits. This is important. Contact a health care provider if: You have a fever or chills. Your symptoms do not get better within 2-3 weeks, or your symptoms get worse. Your eye is red, irritated, or painful. You have new symptoms. Get help right away if: You have weakness or numbness in a part of your body other than your face. You have trouble swallowing. You develop neck pain or stiffness. You develop dizziness or shortness of breath. Summary Bell's palsy is a short-term inability to move muscles in a part of the face. The inability to move results from inflammation or compression of the facial nerve. This condition affects every person differently. Sometimes symptoms go away without treatment within a couple weeks. If treatment is needed, it varies from person to person. The goal of treatment is to reduce inflammation and protect the eye from damage. Contact your health care provider if your symptoms do not get better within 2-3 weeks, or your symptoms get worse. This information is not intended to replace advice given to you by your health care provider. Make sure you discuss any questions you have with your health care provider. Document Revised: 10/27/2019 Document Reviewed: 10/27/2019 Elsevier Patient Education  [place].

## 2022-04-15 NOTE — Assessment & Plan Note (Signed)
Advised him to schedule appointment with pulmonology soon.  Advised him we would not be able to refill any codeine containing cough syrup.

## 2022-04-15 NOTE — Assessment & Plan Note (Signed)
Sugar is up quite a bit since being on the prednisone.  Advised him to schedule or call his endocrinologist soon to discuss management.

## 2022-04-16 ENCOUNTER — Telehealth: Payer: Self-pay

## 2022-04-16 ENCOUNTER — Inpatient Hospital Stay (HOSPITAL_COMMUNITY)
Admission: EM | Admit: 2022-04-16 | Discharge: 2022-04-26 | DRG: 871 | Disposition: A | Discharge status: Home Health | Payer: 59 | Attending: Internal Medicine | Admitting: Internal Medicine

## 2022-04-16 DIAGNOSIS — J810 Acute pulmonary edema: Secondary | ICD-10-CM

## 2022-04-16 DIAGNOSIS — I2699 Other pulmonary embolism without acute cor pulmonale: Secondary | ICD-10-CM | POA: Insufficient documentation

## 2022-04-16 DIAGNOSIS — Z1152 Encounter for screening for COVID-19: Secondary | ICD-10-CM

## 2022-04-16 DIAGNOSIS — E1169 Type 2 diabetes mellitus with other specified complication: Secondary | ICD-10-CM

## 2022-04-16 DIAGNOSIS — I8393 Asymptomatic varicose veins of bilateral lower extremities: Secondary | ICD-10-CM | POA: Diagnosis not present

## 2022-04-16 DIAGNOSIS — E785 Hyperlipidemia, unspecified: Secondary | ICD-10-CM | POA: Diagnosis present

## 2022-04-16 DIAGNOSIS — E871 Hypo-osmolality and hyponatremia: Secondary | ICD-10-CM | POA: Diagnosis not present

## 2022-04-16 DIAGNOSIS — Z87891 Personal history of nicotine dependence: Secondary | ICD-10-CM

## 2022-04-16 DIAGNOSIS — I50811 Acute right heart failure: Secondary | ICD-10-CM | POA: Diagnosis not present

## 2022-04-16 DIAGNOSIS — I50810 Right heart failure, unspecified: Secondary | ICD-10-CM | POA: Insufficient documentation

## 2022-04-16 DIAGNOSIS — N179 Acute kidney failure, unspecified: Secondary | ICD-10-CM | POA: Diagnosis not present

## 2022-04-16 DIAGNOSIS — E1151 Type 2 diabetes mellitus with diabetic peripheral angiopathy without gangrene: Secondary | ICD-10-CM | POA: Diagnosis present

## 2022-04-16 DIAGNOSIS — N1831 Chronic kidney disease, stage 3a: Secondary | ICD-10-CM | POA: Diagnosis not present

## 2022-04-16 DIAGNOSIS — I2609 Other pulmonary embolism with acute cor pulmonale: Secondary | ICD-10-CM | POA: Diagnosis present

## 2022-04-16 DIAGNOSIS — A400 Sepsis due to streptococcus, group A: Secondary | ICD-10-CM | POA: Diagnosis not present

## 2022-04-16 DIAGNOSIS — I252 Old myocardial infarction: Secondary | ICD-10-CM

## 2022-04-16 DIAGNOSIS — E1165 Type 2 diabetes mellitus with hyperglycemia: Secondary | ICD-10-CM | POA: Diagnosis not present

## 2022-04-16 DIAGNOSIS — K0889 Other specified disorders of teeth and supporting structures: Secondary | ICD-10-CM | POA: Diagnosis present

## 2022-04-16 DIAGNOSIS — I959 Hypotension, unspecified: Secondary | ICD-10-CM | POA: Diagnosis not present

## 2022-04-16 DIAGNOSIS — R079 Chest pain, unspecified: Secondary | ICD-10-CM

## 2022-04-16 DIAGNOSIS — I2510 Atherosclerotic heart disease of native coronary artery without angina pectoris: Secondary | ICD-10-CM | POA: Diagnosis present

## 2022-04-16 DIAGNOSIS — J9601 Acute respiratory failure with hypoxia: Secondary | ICD-10-CM | POA: Diagnosis present

## 2022-04-16 DIAGNOSIS — Z951 Presence of aortocoronary bypass graft: Secondary | ICD-10-CM

## 2022-04-16 DIAGNOSIS — Z8616 Personal history of COVID-19: Secondary | ICD-10-CM | POA: Diagnosis not present

## 2022-04-16 DIAGNOSIS — R0902 Hypoxemia: Secondary | ICD-10-CM | POA: Diagnosis not present

## 2022-04-16 DIAGNOSIS — N189 Chronic kidney disease, unspecified: Secondary | ICD-10-CM | POA: Diagnosis not present

## 2022-04-16 DIAGNOSIS — I5082 Biventricular heart failure: Secondary | ICD-10-CM | POA: Diagnosis not present

## 2022-04-16 DIAGNOSIS — R739 Hyperglycemia, unspecified: Secondary | ICD-10-CM | POA: Diagnosis not present

## 2022-04-16 DIAGNOSIS — R6520 Severe sepsis without septic shock: Secondary | ICD-10-CM | POA: Diagnosis present

## 2022-04-16 DIAGNOSIS — I5033 Acute on chronic diastolic (congestive) heart failure: Secondary | ICD-10-CM | POA: Diagnosis present

## 2022-04-16 DIAGNOSIS — I509 Heart failure, unspecified: Secondary | ICD-10-CM | POA: Diagnosis not present

## 2022-04-16 DIAGNOSIS — I130 Hypertensive heart and chronic kidney disease with heart failure and stage 1 through stage 4 chronic kidney disease, or unspecified chronic kidney disease: Secondary | ICD-10-CM | POA: Diagnosis present

## 2022-04-16 DIAGNOSIS — E1142 Type 2 diabetes mellitus with diabetic polyneuropathy: Secondary | ICD-10-CM | POA: Diagnosis present

## 2022-04-16 DIAGNOSIS — M545 Low back pain, unspecified: Principal | ICD-10-CM

## 2022-04-16 DIAGNOSIS — E11649 Type 2 diabetes mellitus with hypoglycemia without coma: Secondary | ICD-10-CM | POA: Diagnosis not present

## 2022-04-16 DIAGNOSIS — I700 Atherosclerosis of aorta: Secondary | ICD-10-CM | POA: Diagnosis present

## 2022-04-16 DIAGNOSIS — K7460 Unspecified cirrhosis of liver: Secondary | ICD-10-CM | POA: Diagnosis not present

## 2022-04-16 DIAGNOSIS — N170 Acute kidney failure with tubular necrosis: Secondary | ICD-10-CM | POA: Diagnosis not present

## 2022-04-16 DIAGNOSIS — I071 Rheumatic tricuspid insufficiency: Secondary | ICD-10-CM | POA: Insufficient documentation

## 2022-04-16 DIAGNOSIS — Z8249 Family history of ischemic heart disease and other diseases of the circulatory system: Secondary | ICD-10-CM

## 2022-04-16 DIAGNOSIS — I499 Cardiac arrhythmia, unspecified: Secondary | ICD-10-CM | POA: Diagnosis not present

## 2022-04-16 DIAGNOSIS — E1122 Type 2 diabetes mellitus with diabetic chronic kidney disease: Secondary | ICD-10-CM | POA: Diagnosis not present

## 2022-04-16 DIAGNOSIS — I361 Nonrheumatic tricuspid (valve) insufficiency: Secondary | ICD-10-CM | POA: Diagnosis not present

## 2022-04-16 DIAGNOSIS — G510 Bell's palsy: Secondary | ICD-10-CM | POA: Diagnosis present

## 2022-04-16 DIAGNOSIS — I50812 Chronic right heart failure: Secondary | ICD-10-CM | POA: Insufficient documentation

## 2022-04-16 DIAGNOSIS — R7881 Bacteremia: Secondary | ICD-10-CM | POA: Diagnosis present

## 2022-04-16 DIAGNOSIS — E876 Hypokalemia: Secondary | ICD-10-CM | POA: Diagnosis present

## 2022-04-16 DIAGNOSIS — D696 Thrombocytopenia, unspecified: Secondary | ICD-10-CM | POA: Diagnosis not present

## 2022-04-16 DIAGNOSIS — I4510 Unspecified right bundle-branch block: Secondary | ICD-10-CM | POA: Diagnosis present

## 2022-04-16 DIAGNOSIS — Z794 Long term (current) use of insulin: Secondary | ICD-10-CM | POA: Diagnosis not present

## 2022-04-16 DIAGNOSIS — M549 Dorsalgia, unspecified: Secondary | ICD-10-CM | POA: Diagnosis not present

## 2022-04-16 DIAGNOSIS — Z6841 Body Mass Index (BMI) 40.0 and over, adult: Secondary | ICD-10-CM

## 2022-04-16 DIAGNOSIS — G4733 Obstructive sleep apnea (adult) (pediatric): Secondary | ICD-10-CM | POA: Diagnosis present

## 2022-04-16 DIAGNOSIS — I340 Nonrheumatic mitral (valve) insufficiency: Secondary | ICD-10-CM | POA: Diagnosis not present

## 2022-04-16 DIAGNOSIS — R109 Unspecified abdominal pain: Secondary | ICD-10-CM | POA: Diagnosis not present

## 2022-04-16 NOTE — Patient Outreach (Signed)
  Care Coordination   04/16/2022 Name: [name] [name] MRN: [id number] DOB: 1955/08/20   Care Coordination Outreach Attempts:  An unsuccessful telephone outreach was attempted today to offer the patient information about available care coordination services as a benefit of their health plan.   Follow Up Plan:  Additional outreach attempts will be made to offer the patient care coordination information and services.   Encounter Outcome:  No Answer   Care Coordination Interventions:  No, not indicated      Enzo Montgomery, RN,BSN,CCM [facility] Care Management Care Management Community Coordinator Direct Phone: [phone] Toll Free: [phone] Fax: [phone]

## 2022-04-16 NOTE — Patient Outreach (Signed)
  Care Coordination   Initial Visit Note   04/16/2022 Name: [name] [name] MRN: [id number] DOB: May 28, 1955  [name] [name] is a 67 y.o. year old male who sees [name], MD for primary care. Incoming call from patient returning RN CM call.  What matters to the patients health and wellness today?  Patient voices that he is currently taking Prednisone for seven days-prescribed during his recent ED visit. He has been having issues with elevated blood sugars. He voices his highest cbg reading was around 500. Blood sugar today was 320-non-fasting. He voices that whenever he has to take steroids its "messes up his blood sugars." Patient taking insulin as ordered. He voices he has been checking his blood sugars about 6-8x/day. He states he has been a diabetic for over 20 yrs. Last A1c was 9.8(Feb 2024) . Patient saw PCP for follow up appt yesterday and was advised to make an appt with endocrinologist. He voices he will call and make an appt. [facility] services reviewed and discussed. Patient agreeable to RN CM following back up for Dm mgmt education and support.  Patient states he prefers afternoon calls.      SDOH assessments and interventions completed:  Yes  SDOH Interventions Today    Flowsheet Row Most Recent Value  SDOH Interventions   Food Insecurity Interventions Intervention Not Indicated  Transportation Interventions Intervention Not Indicated        Care Coordination Interventions:  Yes, provided    Interventions Today    Flowsheet Row Most Recent Value  Chronic Disease   Chronic disease during today's visit Diabetes  General Interventions   General Interventions Discussed/Reviewed General Interventions Discussed, Referral to Nurse  [facility] testing]  Education Interventions   Education Provided Provided Education  Provided Verbal Education On Nutrition, Blood Sugar Monitoring, Medication, When to see the doctor  Nutrition Interventions   Nutrition Discussed/Reviewed Nutrition  Discussed, Adding fruits and vegetables, Decreasing sugar intake, Carbohydrate meal planning  Pharmacy Interventions   Pharmacy Dicussed/Reviewed Pharmacy Topics Discussed, Medications and their functions       Follow up plan: Referral made to RN CM -appt scheduled for follow up    Encounter Outcome:  Pt. Visit Completed    Enzo Montgomery, RN,BSN,CCM [facility] Care Management Care Management Community Coordinator Direct Phone: [phone] Toll Free: [phone] Fax: [phone] '

## 2022-04-17 ENCOUNTER — Emergency Department (HOSPITAL_COMMUNITY): Payer: 59

## 2022-04-17 ENCOUNTER — Other Ambulatory Visit: Payer: Self-pay

## 2022-04-17 ENCOUNTER — Observation Stay (HOSPITAL_COMMUNITY): Payer: 59

## 2022-04-17 DIAGNOSIS — J9601 Acute respiratory failure with hypoxia: Secondary | ICD-10-CM | POA: Diagnosis present

## 2022-04-17 DIAGNOSIS — I5033 Acute on chronic diastolic (congestive) heart failure: Secondary | ICD-10-CM

## 2022-04-17 LAB — COMPREHENSIVE METABOLIC PANEL
ALT: 19 U/L (ref 0–44)
AST: 30 U/L (ref 15–41)
Albumin: 3.1 g/dL — ABNORMAL LOW (ref 3.5–5.0)
Alkaline Phosphatase: 104 U/L (ref 38–126)
Anion gap: 14 (ref 5–15)
BUN: 24 mg/dL — ABNORMAL HIGH (ref 8–23)
CO2: 23 mmol/L (ref 22–32)
Calcium: 9.2 mg/dL (ref 8.9–10.3)
Chloride: 100 mmol/L (ref 98–111)
Creatinine, Ser: 1.62 mg/dL — ABNORMAL HIGH (ref 0.61–1.24)
GFR, Estimated: 46 mL/min — ABNORMAL LOW (ref >60)
Glucose, Bld: 217 mg/dL — ABNORMAL HIGH (ref 70–99)
Potassium: 3.8 mmol/L (ref 3.5–5.1)
Sodium: 137 mmol/L (ref 135–145)
Total Bilirubin: 0.9 mg/dL (ref 0.3–1.2)
Total Protein: 8.2 g/dL — ABNORMAL HIGH (ref 6.5–8.1)

## 2022-04-17 LAB — URINALYSIS, ROUTINE W REFLEX MICROSCOPIC
Bacteria, UA: NONE SEEN
Bilirubin Urine: NEGATIVE
Glucose, UA: 150 mg/dL — AB
Ketones, ur: NEGATIVE mg/dL
Leukocytes,Ua: NEGATIVE
Nitrite: NEGATIVE
Protein, ur: 30 mg/dL — AB
Specific Gravity, Urine: 1.015 (ref 1.005–1.030)
pH: 6 (ref 5.0–8.0)

## 2022-04-17 LAB — BLOOD CULTURE ID PANEL (REFLEXED) - BCID2

## 2022-04-17 LAB — CBC
HCT: 41.6 % (ref 39.0–52.0)
HCT: 43.9 % (ref 39.0–52.0)
Hemoglobin: 14 g/dL (ref 13.0–17.0)
Hemoglobin: 14.4 g/dL (ref 13.0–17.0)
MCH: 34.7 pg — ABNORMAL HIGH (ref 26.0–34.0)
MCH: 34.1 pg — ABNORMAL HIGH (ref 26.0–34.0)
MCHC: 33.7 g/dL (ref 30.0–36.0)
MCHC: 32.8 g/dL (ref 30.0–36.0)
MCV: 103 fL — ABNORMAL HIGH (ref 80.0–100.0)
MCV: 104 fL — ABNORMAL HIGH (ref 80.0–100.0)
Platelets: 189 10*3/uL (ref 150–400)
Platelets: 185 10*3/uL (ref 150–400)
RBC: 4.04 MIL/uL — ABNORMAL LOW (ref 4.22–5.81)
RBC: 4.22 MIL/uL (ref 4.22–5.81)
RDW: 15.1 % (ref 11.5–15.5)
RDW: 15.4 % (ref 11.5–15.5)
WBC: 10.6 10*3/uL — ABNORMAL HIGH (ref 4.0–10.5)
WBC: 13 10*3/uL — ABNORMAL HIGH (ref 4.0–10.5)
nRBC: 0 % (ref 0.0–0.2)
nRBC: 0 % (ref 0.0–0.2)

## 2022-04-17 LAB — RESPIRATORY PANEL BY PCR

## 2022-04-17 LAB — ECHOCARDIOGRAM COMPLETE
Height: 71 in
S' Lateral: 3.1 cm
Weight: 4903.03 oz

## 2022-04-17 LAB — BRAIN NATRIURETIC PEPTIDE: B Natriuretic Peptide: 1006.1 pg/mL — ABNORMAL HIGH (ref 0.0–100.0)

## 2022-04-17 LAB — CBG MONITORING, ED
Glucose-Capillary: 181 mg/dL — ABNORMAL HIGH (ref 70–99)
Glucose-Capillary: 206 mg/dL — ABNORMAL HIGH (ref 70–99)

## 2022-04-17 LAB — RESP PANEL BY RT-PCR (RSV, FLU A&B, COVID)  RVPGX2
Influenza A by PCR: NEGATIVE
Influenza B by PCR: NEGATIVE
Resp Syncytial Virus by PCR: NEGATIVE
SARS Coronavirus 2 by RT PCR: NEGATIVE

## 2022-04-17 LAB — GLUCOSE, CAPILLARY
Glucose-Capillary: 169 mg/dL — ABNORMAL HIGH (ref 70–99)
Glucose-Capillary: 155 mg/dL — ABNORMAL HIGH (ref 70–99)

## 2022-04-17 LAB — CREATININE, SERUM
Creatinine, Ser: 1.47 mg/dL — ABNORMAL HIGH (ref 0.61–1.24)
GFR, Estimated: 52 mL/min — ABNORMAL LOW (ref >60)

## 2022-04-17 LAB — HEMOGLOBIN A1C
Hgb A1c MFr Bld: 9.7 % — ABNORMAL HIGH (ref 4.8–5.6)
Mean Plasma Glucose: 232 mg/dL

## 2022-04-17 LAB — HIV ANTIBODY (ROUTINE TESTING W REFLEX): HIV Screen 4th Generation wRfx: NONREACTIVE

## 2022-04-17 LAB — TROPONIN I (HIGH SENSITIVITY)
Troponin I (High Sensitivity): 11 ng/L (ref <18)
Troponin I (High Sensitivity): 11 ng/L (ref <18)

## 2022-04-17 LAB — PROCALCITONIN: Procalcitonin: 0.69 ng/mL

## 2022-04-17 LAB — LIPASE, BLOOD: Lipase: 60 U/L — ABNORMAL HIGH (ref 11–51)

## 2022-04-17 MED ORDER — MIDODRINE HCL 5 MG PO TABS: 5.0000 mg | ORAL_TABLET | Freq: Three times a day (TID) | ORAL | Status: DC

## 2022-04-17 MED ORDER — FUROSEMIDE 10 MG/ML IJ SOLN
60.0000 mg | Freq: Two times a day (BID) | INTRAMUSCULAR | Status: DC
  Administered 2022-04-17: 60 mg via INTRAVENOUS
  Filled 2022-04-17: qty 6

## 2022-04-17 MED ORDER — SODIUM CHLORIDE 0.9 % IV SOLN
500.0000 mg | INTRAVENOUS | Status: DC
  Administered 2022-04-17: 500 mg via INTRAVENOUS
  Filled 2022-04-17: qty 5

## 2022-04-17 MED ORDER — ENOXAPARIN SODIUM 40 MG/0.4ML IJ SOSY
40.0000 mg | PREFILLED_SYRINGE | Freq: Every day | INTRAMUSCULAR | Status: DC
  Administered 2022-04-17 – 2022-04-18 (×2): 40 mg via SUBCUTANEOUS
  Filled 2022-04-17 (×2): qty 0.4

## 2022-04-17 MED ORDER — PENICILLIN G POTASSIUM 20000000 UNITS IJ SOLR
12.0000 10*6.[IU] | Freq: Two times a day (BID) | INTRAVENOUS | Status: DC
  Administered 2022-04-18 – 2022-04-25 (×16): 12 10*6.[IU] via INTRAVENOUS
  Filled 2022-04-17 (×19): qty 12

## 2022-04-17 MED ORDER — IPRATROPIUM-ALBUTEROL 0.5-2.5 (3) MG/3ML IN SOLN
3.0000 mL | Freq: Four times a day (QID) | RESPIRATORY_TRACT | Status: AC
  Administered 2022-04-17 – 2022-04-18 (×4): 3 mL via RESPIRATORY_TRACT
  Filled 2022-04-17 (×4): qty 3

## 2022-04-17 MED ORDER — LINEZOLID 600 MG/300ML IV SOLN
600.0000 mg | Freq: Two times a day (BID) | INTRAVENOUS | Status: DC
  Administered 2022-04-18: 600 mg via INTRAVENOUS
  Filled 2022-04-17 (×3): qty 300

## 2022-04-17 MED ORDER — SODIUM CHLORIDE 0.9 % IV SOLN
1.0000 g | INTRAVENOUS | Status: DC
  Administered 2022-04-17: 1 g via INTRAVENOUS
  Filled 2022-04-17: qty 10

## 2022-04-17 MED ORDER — LIDOCAINE 5 % EX PTCH: 1.0000 | MEDICATED_PATCH | CUTANEOUS | 0 refills | Status: DC

## 2022-04-17 MED ORDER — LOSARTAN POTASSIUM 50 MG PO TABS
25.0000 mg | ORAL_TABLET | Freq: Every day | ORAL | Status: DC
  Administered 2022-04-17: 25 mg via ORAL
  Filled 2022-04-17: qty 1

## 2022-04-17 MED ORDER — SODIUM CHLORIDE 0.9% FLUSH
3.0000 mL | Freq: Two times a day (BID) | INTRAVENOUS | Status: DC
  Administered 2022-04-18 – 2022-04-26 (×13): 3 mL via INTRAVENOUS

## 2022-04-17 MED ORDER — FAMOTIDINE 20 MG PO TABS
20.0000 mg | ORAL_TABLET | Freq: Every day | ORAL | Status: DC
  Administered 2022-04-17 – 2022-04-26 (×10): 20 mg via ORAL
  Filled 2022-04-17 (×10): qty 1

## 2022-04-17 MED ORDER — SODIUM CHLORIDE 0.9 % IV SOLN: 2.0000 g | INTRAVENOUS | Status: DC

## 2022-04-17 MED ORDER — METOPROLOL TARTRATE 5 MG/5ML IV SOLN
2.5000 mg | Freq: Two times a day (BID) | INTRAVENOUS | Status: DC
  Administered 2022-04-18: 2.5 mg via INTRAVENOUS
  Filled 2022-04-17 (×2): qty 5

## 2022-04-17 MED ORDER — METOPROLOL TARTRATE 25 MG PO TABS
50.0000 mg | ORAL_TABLET | Freq: Two times a day (BID) | ORAL | Status: DC
  Administered 2022-04-17: 50 mg via ORAL
  Filled 2022-04-17: qty 2

## 2022-04-17 MED ORDER — ONDANSETRON HCL 4 MG PO TABS: 4.0000 mg | ORAL_TABLET | Freq: Four times a day (QID) | ORAL | Status: DC | PRN

## 2022-04-17 MED ORDER — ACETAMINOPHEN 325 MG PO TABS
650.0000 mg | ORAL_TABLET | Freq: Four times a day (QID) | ORAL | Status: DC | PRN
  Administered 2022-04-17 – 2022-04-26 (×16): 650 mg via ORAL
  Filled 2022-04-17 (×16): qty 2

## 2022-04-17 MED ORDER — HYDROMORPHONE HCL 1 MG/ML IJ SOLN
1.0000 mg | Freq: Once | INTRAMUSCULAR | Status: AC
  Administered 2022-04-17: 1 mg via INTRAVENOUS
  Filled 2022-04-17: qty 1

## 2022-04-17 MED ORDER — INSULIN ASPART 100 UNIT/ML IJ SOLN
0.0000 [IU] | Freq: Three times a day (TID) | INTRAMUSCULAR | Status: DC
  Administered 2022-04-17 (×3): 3 [IU] via SUBCUTANEOUS
  Administered 2022-04-18 (×2): 8 [IU] via SUBCUTANEOUS
  Administered 2022-04-18: 5 [IU] via SUBCUTANEOUS
  Administered 2022-04-19: 8 [IU] via SUBCUTANEOUS

## 2022-04-17 MED ORDER — OXYCODONE HCL 5 MG PO TABS
5.0000 mg | ORAL_TABLET | Freq: Once | ORAL | Status: AC
  Administered 2022-04-17: 5 mg via ORAL
  Filled 2022-04-17: qty 1

## 2022-04-17 MED ORDER — MORPHINE SULFATE (PF) 2 MG/ML IV SOLN
1.0000 mg | INTRAVENOUS | Status: DC | PRN
  Administered 2022-04-17: 4 mg via INTRAVENOUS
  Filled 2022-04-17: qty 2

## 2022-04-17 MED ORDER — INSULIN ASPART 100 UNIT/ML IJ SOLN
0.0000 [IU] | Freq: Every day | INTRAMUSCULAR | Status: DC
  Administered 2022-04-18: 3 [IU] via SUBCUTANEOUS
  Administered 2022-04-19: 5 [IU] via SUBCUTANEOUS

## 2022-04-17 MED ORDER — ACETAMINOPHEN 650 MG RE SUPP: 650.0000 mg | Freq: Four times a day (QID) | RECTAL | Status: DC | PRN

## 2022-04-17 MED ORDER — ACETAMINOPHEN 500 MG PO TABS
1000.0000 mg | ORAL_TABLET | Freq: Once | ORAL | Status: AC
  Administered 2022-04-17: 1000 mg via ORAL
  Filled 2022-04-17: qty 2

## 2022-04-17 MED ORDER — FUROSEMIDE 10 MG/ML IJ SOLN
20.0000 mg | Freq: Once | INTRAMUSCULAR | Status: AC
  Administered 2022-04-17: 20 mg via INTRAVENOUS
  Filled 2022-04-17: qty 2

## 2022-04-17 MED ORDER — PANTOPRAZOLE SODIUM 40 MG PO TBEC
40.0000 mg | DELAYED_RELEASE_TABLET | Freq: Every day | ORAL | Status: DC
  Administered 2022-04-17 – 2022-04-26 (×10): 40 mg via ORAL
  Filled 2022-04-17 (×11): qty 1

## 2022-04-17 MED ORDER — PREGABALIN 25 MG PO CAPS
50.0000 mg | ORAL_CAPSULE | Freq: Every evening | ORAL | Status: DC | PRN
  Filled 2022-04-17: qty 2

## 2022-04-17 MED ORDER — PREDNISONE 20 MG PO TABS
40.0000 mg | ORAL_TABLET | Freq: Every day | ORAL | Status: AC
  Administered 2022-04-17 – 2022-04-20 (×4): 40 mg via ORAL
  Filled 2022-04-17 (×4): qty 2

## 2022-04-17 MED ORDER — ONDANSETRON HCL 4 MG/2ML IJ SOLN
4.0000 mg | Freq: Four times a day (QID) | INTRAMUSCULAR | Status: DC | PRN
  Administered 2022-04-20 (×3): 4 mg via INTRAVENOUS
  Filled 2022-04-17 (×4): qty 2

## 2022-04-17 MED ORDER — IPRATROPIUM-ALBUTEROL 0.5-2.5 (3) MG/3ML IN SOLN: 3.0000 mL | Freq: Four times a day (QID) | RESPIRATORY_TRACT | Status: DC | PRN

## 2022-04-17 MED ORDER — ASPIRIN 81 MG PO TBEC
81.0000 mg | DELAYED_RELEASE_TABLET | Freq: Every day | ORAL | Status: DC
  Administered 2022-04-17 – 2022-04-26 (×10): 81 mg via ORAL
  Filled 2022-04-17 (×10): qty 1

## 2022-04-17 MED ORDER — SODIUM CHLORIDE 0.9 % IV BOLUS
1000.0000 mL | Freq: Once | INTRAVENOUS | Status: AC
  Administered 2022-04-17: 1000 mL via INTRAVENOUS

## 2022-04-17 MED ORDER — OXYCODONE HCL 5 MG PO TABS: 5.0000 mg | ORAL_TABLET | ORAL | Status: DC | PRN

## 2022-04-17 MED ORDER — ROSUVASTATIN CALCIUM 20 MG PO TABS
20.0000 mg | ORAL_TABLET | Freq: Every evening | ORAL | Status: DC
  Administered 2022-04-17 – 2022-04-25 (×9): 20 mg via ORAL
  Filled 2022-04-17 (×9): qty 1

## 2022-04-17 MED ORDER — INSULIN GLARGINE-YFGN 100 UNIT/ML ~~LOC~~ SOLN
50.0000 [IU] | Freq: Every day | SUBCUTANEOUS | Status: DC
  Administered 2022-04-17 – 2022-04-20 (×4): 50 [IU] via SUBCUTANEOUS
  Filled 2022-04-17 (×5): qty 0.5

## 2022-04-17 MED ORDER — PERFLUTREN LIPID MICROSPHERE
1.0000 mL | INTRAVENOUS | Status: AC | PRN
  Administered 2022-04-17: 2.5 mL via INTRAVENOUS

## 2022-04-17 MED ORDER — MAGNESIUM SULFATE 2 GM/50ML IV SOLN
2.0000 g | Freq: Once | INTRAVENOUS | Status: AC
  Administered 2022-04-17: 2 g via INTRAVENOUS
  Filled 2022-04-17: qty 50

## 2022-04-17 MED ORDER — POTASSIUM CHLORIDE CRYS ER 20 MEQ PO TBCR
20.0000 meq | EXTENDED_RELEASE_TABLET | Freq: Two times a day (BID) | ORAL | Status: DC
  Administered 2022-04-17 – 2022-04-18 (×3): 20 meq via ORAL
  Filled 2022-04-17 (×3): qty 1

## 2022-04-17 MED ORDER — POLYETHYLENE GLYCOL 3350 17 G PO PACK
17.0000 g | PACK | Freq: Every day | ORAL | Status: DC
  Administered 2022-04-18 – 2022-04-23 (×6): 17 g via ORAL
  Filled 2022-04-17 (×10): qty 1

## 2022-04-17 MED ORDER — ARTIFICIAL TEARS OPHTHALMIC OINT
TOPICAL_OINTMENT | OPHTHALMIC | Status: DC
  Administered 2022-04-19 – 2022-04-20 (×6): 1 via OPHTHALMIC
  Filled 2022-04-17 (×3): qty 3.5

## 2022-04-17 NOTE — ED Notes (Signed)
BP meds held at this time per provider as pt bp decreased in the [id number] systolic after bp med administration. Pt remains alert and oriented x 4. Will continue to monitor.

## 2022-04-17 NOTE — Progress Notes (Signed)
PHARMACY - PHYSICIAN COMMUNICATION CRITICAL VALUE ALERT - BLOOD CULTURE IDENTIFICATION (BCID)  [name] [name] is an 67 y.o. male who presented to [facility] on 04/16/2022 with a chief complaint of back pain  Assessment:  Pt with complicated medical hx presented with back pain. Abx started to cover for PNA. BCID came back with 4/4 group A strep.   Name of physician (or Provider) Contacted: Dr Myna Hidalgo  Current antibiotics: Ceftriaxone/azith  Changes to prescribed antibiotics recommended:  Change abx to continuous PCN and linezolid for toxin inhibition  Results for orders placed or performed during the hospital encounter of 04/16/22  Blood Culture ID Panel (Reflexed) (Collected: 04/17/2022  8:23 AM)  Result Value Ref Range   Enterococcus faecalis NOT DETECTED NOT DETECTED   Enterococcus Faecium NOT DETECTED NOT DETECTED   Listeria monocytogenes NOT DETECTED NOT DETECTED   Staphylococcus species NOT DETECTED NOT DETECTED   Staphylococcus aureus (BCID) NOT DETECTED NOT DETECTED   Staphylococcus epidermidis NOT DETECTED NOT DETECTED   Staphylococcus lugdunensis NOT DETECTED NOT DETECTED   Streptococcus species DETECTED (A) NOT DETECTED   Streptococcus agalactiae NOT DETECTED NOT DETECTED   Streptococcus pneumoniae NOT DETECTED NOT DETECTED   Streptococcus pyogenes DETECTED (A) NOT DETECTED   A.calcoaceticus-baumannii NOT DETECTED NOT DETECTED   Bacteroides fragilis NOT DETECTED NOT DETECTED   Enterobacterales NOT DETECTED NOT DETECTED   Enterobacter cloacae complex NOT DETECTED NOT DETECTED   Escherichia coli NOT DETECTED NOT DETECTED   Klebsiella aerogenes NOT DETECTED NOT DETECTED   Klebsiella oxytoca NOT DETECTED NOT DETECTED   Klebsiella pneumoniae NOT DETECTED NOT DETECTED   Proteus species NOT DETECTED NOT DETECTED   Salmonella species NOT DETECTED NOT DETECTED   Serratia marcescens NOT DETECTED NOT DETECTED   Haemophilus influenzae NOT DETECTED NOT DETECTED   Neisseria  meningitidis NOT DETECTED NOT DETECTED   Pseudomonas aeruginosa NOT DETECTED NOT DETECTED   Stenotrophomonas maltophilia NOT DETECTED NOT DETECTED   Candida albicans NOT DETECTED NOT DETECTED   Candida auris NOT DETECTED NOT DETECTED   Candida glabrata NOT DETECTED NOT DETECTED   Candida krusei NOT DETECTED NOT DETECTED   Candida parapsilosis NOT DETECTED NOT DETECTED   Candida tropicalis NOT DETECTED NOT DETECTED   Cryptococcus neoformans/gattii NOT DETECTED NOT DETECTED    Onnie Boer, PharmD, BCIDP, AAHIVP, CPP Infectious Disease Pharmacist 04/17/2022 10:10 PM

## 2022-04-17 NOTE — H&P (Addendum)
History and Physical    Patient: [name] [name] [id number] DOB: 1955/05/09 DOA: 04/16/2022 DOS: the patient was seen and examined on 04/17/2022 PCP: [name], MD  Patient coming from: Home  Chief Complaint:  Chief Complaint  Patient presents with   Back Pain   HPI: [name] [name] is a 67 y.o. male with medical history significant of coronary artery disease status post CABG in [id number], diastolic dysfunction, cirrhosis of the liver, hypertension, ectopic atrial rhythm, dyslipidemia, CKD 3A, diabetes mellitus 2, sleep apnea and associated right bundle branch block, obesity, peripheral neuropathy in context of diabetes as well as varicose veins.  Patient has also had chronic lower extremity edema and did undergo vein harvesting on the right lower extremity and left forearm during his CABG.  Patient presented to the ED via EMS with reports of back pain.  He was recently treated for Bell's palsy and started on steroid taper.  EMS was concerned over possible atypical presentation of cardiac symptoms so he was given nitroglycerin and placed on 2 L of oxygen.  Room air sats were 94%.  After arrival to the ER patient was noted to remain hypoxic and he had a low-grade fever of 100.7.  He continued to have back pain.  Urinalysis without hematuria and CT abdomen and pelvis as well as lumbar spine CT.  Without any explanation for his back pain.  Chest x-ray did demonstrate mild CHF.  PCR for flu and RSV and COVID-negative.  Respiratory viral panel for other organisms pending.  Patient did have mild leukocytosis but this in the context of taking steroids prior to admission.  EDP asked hospitalist to evaluate patient for admission.  Upon my evaluation of the patient his primary complaint was of back pain and it limited his ability to sit up for pulmonary exam.  He did have inspiratory and expiratory crackles especially on the right.  He continues to require 2 L nasal cannula O2 with sats between 93 and 96%.   Blood pressure also remained elevated at times in the 140-150 range but improved after administration of pain medication.  Additional labs were ordered and BNP was elevated at 1006, procalcitonin was 0.69.  Blood cultures were obtained as well.  In review of electronic record last echocardiogram for this patient was in 2019.  Patient will be admitted with a diagnosis of CHF with etiology to be determined after echocardiogram has been reviewed.  He will be admitted to a cardiac telemetry bed.  Review of Systems: As mentioned in the history of present illness. All other systems reviewed and are negative. Past Medical History:  Diagnosis Date   Acute myocardial infarction, unspecified site, episode of care unspecified 2005   Acute pancreatitis    CAD (coronary artery disease)    a. CABG in 2005 w LIMA to LAD, left radial to second circumflex marginal, saphenous vein graft to PDA, saphenous vein graft to lateral subbrach of ramus intermediate, and sequential saphenous vein graft to the medial subbranch of ramus intermediate.   Chronic diastolic CHF (congestive heart failure) (HCC)    Cirrhosis of liver without mention of alcohol    CKD (chronic kidney disease), stage II    Complications affecting other specified body systems, hypertension    Ectopic atrial rhythm    Essential hypertension    Hyperlipidemia    Hypokalemia    a. intermitttent noncompliance with potassium supplement.   Hypomagnesemia    Morbid obesity (HCC)    Neuropathy    Other and  unspecified hyperlipidemia    Proteinuria    RBBB    Sleep apnea    Type II or unspecified type diabetes mellitus without mention of complication, not stated as uncontrolled    Varicose veins of both lower extremities    Past Surgical History:  Procedure Laterality Date   CORONARY ARTERY BYPASS GRAFT     2005   I & D EXTREMITY Left 09/02/2019   Procedure: IRRIGATION AND DEBRIDEMENT 4th TOE DEBRIDIMENT;  Surgeon: Evelina Bucy, DPM;  Location:  [facility];  Service: Podiatry;  Laterality: Left;   Social History:  reports that he quit smoking about 17 years ago. His smoking use included cigarettes. He has a 45.00 pack-year smoking history. He has never used smokeless tobacco. He reports current alcohol use. He reports that he does not use drugs.  Allergies  Allergen Reactions   Empagliflozin Rash    Patient reports yeast.    Ibuprofen Other (See Comments)    Stomach pain    Family History  Problem Relation Age of Onset   Breast cancer Mother    Prostate cancer Father    Coronary artery disease Father        underwent CABG    Prior to Admission medications   Medication Sig Start Date End Date Taking? Authorizing Provider  lidocaine (LIDODERM) 5 % Place 1 patch onto the skin daily. Remove & Discard patch within 12 hours or as directed by MD 04/17/22  Yes Maudie Flakes, MD  aspirin EC 81 MG tablet Take 81 mg by mouth daily.    [provider]  B-D ULTRAFINE III SHORT PEN 31G X 8 MM MISC  03/11/16   [provider]  chlorhexidine (HIBICLENS) 4 % external liquid Apply topically daily as needed. 02/12/21   [name], MD  collagenase (SANTYL) ointment Apply 1 application topically daily. Right ankle measures 6.0 x 0.3cm. 07/15/19   Evelina Bucy, DPM  diclofenac Sodium (VOLTAREN) 1 % GEL APPLY 2 GRAMS TOPICALLY 4 TIMES DAILY TO KNEE 05/27/21   [name], MD  eplerenone (INSPRA) 25 MG tablet TAKE 1 TABLET(25 MG) BY MOUTH DAILY 10/21/21   [name], MD  famotidine (PEPCID) 20 MG tablet TAKE 1 TABLET(20 MG) BY MOUTH DAILY 11/13/21   [name], MD  fluticasone (FLONASE) 50 MCG/ACT nasal spray Place 1 spray into both nostrils daily. 05/27/21   [name], MD  Fluticasone-Umeclidin-Vilant (TRELEGY ELLIPTA) 100-62.5-25 MCG/ACT AEPB Inhale 1 puff into the lungs daily. 09/30/21   Young, Tarri Fuller D, MD  glucose blood (ONETOUCH ULTRA) test strip TEST AS DIRECTED 8 TIMES DAILY 01/31/22   Allwardt, Alyssa M, PA-C   HUMALOG 100 UNIT/ML injection Inject into the skin. 07/16/21   [provider]  HUMALOG KWIKPEN 200 UNIT/ML KwikPen Inject into the skin. 05/21/21   [provider]  HUMULIN R U-500 KWIKPEN 500 UNIT/ML kwikpen Inject 50-60 Units into the skin See admin instructions. 60 units in the AM, 50 units at lunch and 50 in the evening 01/12/20   [name], MD  hydroxypropyl methylcellulose / hypromellose (ISOPTO TEARS / GONIOVISC) 2.5 % ophthalmic solution Place 2 drops into both eyes as needed for dry eyes. 04/15/22   [name], MD  ipratropium-albuterol (DUONEB) 0.5-2.5 (3) MG/3ML SOLN 1 vial in nebulizer up to 4 times daily 11/13/20   Deneise Lever, MD  Lancets (ACCU-CHEK MULTICLIX) lancets  02/15/16   [provider]  LANTUS SOLOSTAR 100 UNIT/ML Solostar  Pen Inject 55 Units into the skin every morning. 09/22/19   [provider]  losartan (COZAAR) 50 MG tablet TAKE 1/2 TABLET(25 MG) BY MOUTH DAILY 04/14/22   [name], MD  MAGNESIUM-OXIDE 400 (241.3 Mg) MG tablet Take 1 tablet by mouth 2 (two) times daily. 10/06/19   [provider]  meloxicam (MOBIC) 15 MG tablet Take 1 tablet (15 mg total) by mouth daily. 10/09/21   Glennon Mac, DO  metoprolol tartrate (LOPRESSOR) 50 MG tablet TAKE 1 TABLET(50 MG) BY MOUTH TWICE DAILY 08/14/21   [name], MD  nitroGLYCERIN (NITROSTAT) 0.4 MG SL tablet Place 1 tablet (0.4 mg total) under the tongue every 5 (five) minutes as needed for chest pain. 08/21/21   [name], MD  nystatin ointment (MYCOSTATIN) Apply 1 application topically 2 (two) times daily. 10/31/19   [name], MD  ofloxacin (OCUFLOX) 0.3 % ophthalmic solution Place 1 drop into the right eye 4 (four) times daily. 07/16/21   [provider]  polyethylene glycol (MIRALAX / GLYCOLAX) 17 g packet Take 17 g by mouth daily. 09/03/19   Bonnielee Haff, MD  potassium chloride SA (KLOR-CON M) 20 MEQ tablet TAKE 2 TABLETS(40 MEQ) BY MOUTH  THREE TIMES DAILY 01/09/22   [name], MD  predniSONE (DELTASONE) 10 MG tablet Take 6 tablets (60 mg total) by mouth daily for 7 days. 04/12/22 04/19/22  Godfrey Pick, MD  pregabalin (LYRICA) 50 MG capsule Take 1 capsule (50 mg total) by mouth at bedtime as needed (nerve pain). 03/25/21   Gregor Hams, MD  PROAIR HFA 108 [phone] Base) MCG/ACT inhaler Inhale 2 puffs into the lungs every 6 (six) hours as needed for wheezing or shortness of breath. 09/05/21   Cobb, Karie Schwalbe, NP  rosuvastatin (CRESTOR) 20 MG tablet Take 1 tablet (20 mg total) by mouth every evening. 05/27/21   [name], MD  silver sulfADIAZINE (SSD) 1 % cream APPLY PEA SIZED AMOUNT TOPICALLY TO WOUND DAILY 01/07/21   [name], MD  torsemide (DEMADEX) 20 MG tablet TAKE 1 TABLET(20 MG) BY MOUTH TWICE DAILY 08/14/21   [name], MD    Physical Exam: Vitals:   04/17/22 0953 04/17/22 1002 04/17/22 1020 04/17/22 1110  BP:  120/65 102/68   Pulse:  (!) 113 (!) 109   Resp:  17 17   Temp: (!) 101.1 F (38.4 C)   98.7 F (37.1 C)  TempSrc: Oral   Oral  SpO2:  96% 96%   Weight:      Height:       Constitutional: NAD, calm, comfortable Respiratory: Bilateral inspiratory and expiratory crackles, no increased work of breathing at rest and only complaint is of back pain when attempting to sit forward for pulmonary exam.  Continues to require 2 L oxygen Cardiovascular: Irregular rate and rhythm notable for frequent PACs and PVCs.  No murmurs / rubs / gallops. No extremity edema. 2+ pedal pulses. No carotid bruits.  Abdomen: no tenderness, no masses palpated. No obvious hepatosplenomegaly.  Abdomen somewhat taut and distended but nontender.  Bowel sounds positive.  Musculoskeletal: no clubbing / cyanosis. No joint deformity upper and lower extremities. Good ROM, no contractures. Normal muscle tone.  Skin: no rashes, or induration.  Patient has multiple nodular-like areas on both lower extremities and changes consistent with  chronic stasis dermatitis Neurologic: CN 2-12 grossly intact except for right facial droop and abnormalities involving the right secondary to recently diagnosed Bell's palsy.  Sensation intact,  Strength 5/5 x all 4 extremities.  Psychiatric: Normal judgment and insight. Alert and oriented x 3. Normal mood.   Data Reviewed:  As per HPI  Assessment and Plan: Acute hypoxemic respiratory failure secondary to acute CHF exacerbation BNP significantly elevated at 1000+ Subtype of CHF to be determined after follow-up echocardiogram done. Echocardiogram in 2019 revealed an EF of 50 to 55% with mild MR, mild dilatation of the RV and moderate dilatation of the RA.   Continue supportive care with oxygen and nebulizers Patient did have elevation in procalcitonin but no clear-cut infection identified on imaging but as a precaution have initiated Rocephin and Zithromax Follow-up on blood cultures Begin Lasix 60 mg IV every 12 hours with supplemental potassium.  Was on Demadex at home Continue Cozaar and Lopressor ** After above meds given pt dropped SBP into the 80s- decision made to dc LASIX, COZAAR AND LOPRESSOR-will give very low dose IV Lopressor to prevent BB rebound tachycardia** Patient is diabetic and is found to have systolic dysfunction GDMT would include SGLTP 1 medications  History of CAD with MI and prior CABG in 2005 Patient unable to recall pre CABG and MI symptoms If significant changes in echocardiogram that demonstrates systolic dysfunction likely will need cardiology consultation and possible ischemic evaluation Continue statin and beta-blocker  Recent diagnosis of Bell's palsy Was on steroid taper that was supposed to be completed on Saturday Will initiate prednisone 40 mg daily x 4 days  Sinus rhythm/sinus tachycardia with frequent ectopy Apparent history of same Maintain potassium greater than 4.0 Magnesium was 1.5 so have repleted with 2 g IV  Diabetes mellitus 2 on  insulin Home medication nonformulary so we will utilize Semglee 50 units daily Follow CBGs and provide SSI Hemoglobin A1c March 2023 was 11-will repeat hemoglobin A1c this admission  Sleep apnea and obesity BMI 42.74 Home sleep test from 2020 recommended CPAP with sleep study to obtain accurate settings. Will order CPAP here and if patient can member home settings we will utilize otherwise will allow RT to set CPAP  CKD IIIa Renal function stable and at baseline    Advance Care Planning:   Code Status: Full Code   DVT prophylaxis: Lovenox  Consults: None  Family Communication: Patient only  Severity of Illness: The appropriate patient status for this patient is OBSERVATION. Observation status is judged to be reasonable and necessary in order to provide the required intensity of service to ensure the patient's safety. The patient's presenting symptoms, physical exam findings, and initial radiographic and laboratory data in the context of their medical condition is felt to place them at decreased risk for further clinical deterioration. Furthermore, it is anticipated that the patient will be medically stable for discharge from the hospital within 2 midnights of admission.   Author: Erin Hearing, NP 04/17/2022 11:35 AM  For on call review [email or web address].

## 2022-04-17 NOTE — Discharge Instructions (Addendum)
You were evaluated in the Emergency Department and after careful evaluation, we did not find any emergent condition requiring admission or further testing in the hospital.  Your exam/testing today is overall reassuring.  Use the numbing patches provided as needed for pain, follow-up with your regular doctor.  Please return to the Emergency Department if you experience any worsening of your condition.   Thank you for allowing [place] to be a part of your care.

## 2022-04-17 NOTE — ED Notes (Signed)
ED TO INPATIENT HANDOFF REPORT  ED Nurse Name and Phone #: Anette Guarneri [phone]  S Name/Age/Gender [name] [name] 67 y.o. male Room/Bed: 012C/012C  Code Status   Code Status: Full Code  Home/SNF/Other Home Patient oriented to: self, place, time, and situation Is this baseline? Yes   Triage Complete: Triage complete  Chief Complaint Acute respiratory failure with hypoxia ([place]) [J96.01]  Triage Note Pt arrived via [facility] for cc of back pain, onset 1-2 hours ago. Pt seen recently seen at [facility] for facial droop, droop persists, undergoing treatment for bell's palsy. EMS notes increased edema in pt legs and abdomen as well as decreased lower breath sounds. PMH CHF, EMS administered 0.4 nitro and placed on 2L [place]. Pt denies shortness of breath, RA sat 94%.. Pt is alert and oriented, GCS 15. 18g LAC.  PTA EMS Vitals  BP 140/90 HR 90 RR 20 SPO2 96% CBG 349     Allergies Allergies  Allergen Reactions   Empagliflozin Rash    Patient reports yeast.    Ibuprofen Other (See Comments)    Stomach pain    Level of Care/Admitting Diagnosis ED Disposition     ED Disposition  Admit   Condition  --   Comment  Hospital Area: [facility] [[id number]]  Level of Care: Telemetry Cardiac [[id number]]  May place patient in observation at [facility] or [place] if equivalent level of care is available:: Yes  Covid Evaluation: Confirmed COVID Negative  Diagnosis: Acute respiratory failure with hypoxia [id number]  Admitting Physician: Manfred Shirts  Attending Physician: Waldron Labs, DAWOOD S [[id number]]          B Medical/Surgery History Past Medical History:  Diagnosis Date   Acute myocardial infarction, unspecified site, episode of care unspecified 2005   Acute pancreatitis    CAD (coronary artery disease)    a. CABG in 2005 w LIMA to LAD, left radial to second circumflex marginal, saphenous vein graft to PDA, saphenous vein graft to lateral subbrach of  ramus intermediate, and sequential saphenous vein graft to the medial subbranch of ramus intermediate.   Chronic diastolic CHF (congestive heart failure) (HCC)    Cirrhosis of liver without mention of alcohol    CKD (chronic kidney disease), stage II    Complications affecting other specified body systems, hypertension    Ectopic atrial rhythm    Essential hypertension    Hyperlipidemia    Hypokalemia    a. intermitttent noncompliance with potassium supplement.   Hypomagnesemia    Morbid obesity (HCC)    Neuropathy    Other and unspecified hyperlipidemia    Proteinuria    RBBB    Sleep apnea    Type II or unspecified type diabetes mellitus without mention of complication, not stated as uncontrolled    Varicose veins of both lower extremities    Past Surgical History:  Procedure Laterality Date   CORONARY ARTERY BYPASS GRAFT     2005   I & D EXTREMITY Left 09/02/2019   Procedure: IRRIGATION AND DEBRIDEMENT 4th TOE DEBRIDIMENT;  Surgeon: Evelina Bucy, DPM;  Location: [facility];  Service: Podiatry;  Laterality: Left;     A IV Location/Drains/Wounds Patient Lines/Drains/Airways Status     Active Line/Drains/Airways     Name Placement date Placement time Site Days   Peripheral IV 04/17/22 20 G Right Hand 04/17/22  0725  Hand  less than 1   External Urinary Catheter 04/17/22  0558  --  less than 1  Intake/Output Last 24 hours  Intake/Output Summary (Last 24 hours) at 04/17/2022 1335 Last data filed at 04/17/2022 1107 Gross per 24 hour  Intake 400 ml  Output 850 ml  Net -450 ml    Labs/Imaging Results for orders placed or performed during the hospital encounter of 04/16/22 (from the past 48 hour(s))  CBC     Status: Abnormal   Collection Time: 04/17/22 12:45 AM  Result Value Ref Range   WBC 10.6 (H) 4.0 - 10.5 K/uL   RBC 4.04 (L) 4.22 - 5.81 MIL/uL   Hemoglobin 14.0 13.0 - 17.0 g/dL   HCT 41.6 39.0 - 52.0 %   MCV 103.0 (H) 80.0 - 100.0 fL   MCH 34.7 (H)  26.0 - 34.0 pg   MCHC 33.7 30.0 - 36.0 g/dL   RDW 15.1 11.5 - 15.5 %   Platelets 189 150 - 400 K/uL   nRBC 0.0 0.0 - 0.2 %    Comment: Performed at [facility], [place] [address], [place], [place] [address]  Troponin I (High Sensitivity)     Status: None   Collection Time: 04/17/22 12:45 AM  Result Value Ref Range   Troponin I (High Sensitivity) 11 <18 ng/L    Comment: (NOTE) Elevated high sensitivity troponin I (hsTnI) values and significant  changes across serial measurements may suggest ACS but many other  chronic and acute conditions are known to elevate hsTnI results.  Refer to the "Links" section for chest pain algorithms and additional  guidance. Performed at [facility], [place] [address], [place], [place] [address]   Comprehensive metabolic panel     Status: Abnormal   Collection Time: 04/17/22 12:45 AM  Result Value Ref Range   Sodium 137 135 - 145 mmol/L   Potassium 3.8 3.5 - 5.1 mmol/L   Chloride 100 98 - 111 mmol/L   CO2 23 22 - 32 mmol/L   Glucose, Bld 217 (H) 70 - 99 mg/dL    Comment: Glucose reference range applies only to samples taken after fasting for at least 8 hours.   BUN 24 (H) 8 - 23 mg/dL   Creatinine, Ser 1.62 (H) 0.61 - 1.24 mg/dL   Calcium 9.2 8.9 - 10.3 mg/dL   Total Protein 8.2 (H) 6.5 - 8.1 g/dL   Albumin 3.1 (L) 3.5 - 5.0 g/dL   AST 30 15 - 41 U/L   ALT 19 0 - 44 U/L   Alkaline Phosphatase 104 38 - 126 U/L   Total Bilirubin 0.9 0.3 - 1.2 mg/dL   GFR, Estimated 46 (L) >60 mL/min    Comment: (NOTE) Calculated using the CKD-EPI Creatinine Equation (2021)    Anion gap 14 5 - 15    Comment: Performed at [facility], [place] [address], [place], [place] [address]  Lipase, blood     Status: Abnormal   Collection Time: 04/17/22 12:45 AM  Result Value Ref Range   Lipase 60 (H) 11 - 51 U/L    Comment: Performed at [place] [address], [place],  [address]  Troponin I (High Sensitivity)     Status: None   Collection  Time: 04/17/22  2:40 AM  Result Value Ref Range   Troponin I (High Sensitivity) 11 <18 ng/L    Comment: (NOTE) Elevated high sensitivity troponin I (hsTnI) values and significant  changes across serial measurements may suggest ACS but many other  chronic and acute conditions are known to elevate hsTnI results.  Refer to the "Links" section for chest pain algorithms and additional  guidance. Performed at [facility], [place] [address] [place], [place] [address]   Procalcitonin     Status: None   Collection Time: 04/17/22  2:40 AM  Result Value Ref Range   Procalcitonin 0.69 ng/mL    Comment:        Interpretation: PCT > 0.5 ng/mL and <= 2 ng/mL: Systemic infection (sepsis) is possible, but other conditions are known to elevate PCT as well. (NOTE)       Sepsis PCT Algorithm           Lower Respiratory Tract                                      Infection PCT Algorithm    ----------------------------     ----------------------------         PCT < 0.25 ng/mL                PCT < 0.10 ng/mL          Strongly encourage             Strongly discourage   discontinuation of antibiotics    initiation of antibiotics    ----------------------------     -----------------------------       PCT 0.25 - 0.50 ng/mL            PCT 0.10 - 0.25 ng/mL               OR       >80% decrease in PCT            Discourage initiation of                                            antibiotics      Encourage discontinuation           of antibiotics    ----------------------------     -----------------------------         PCT >= 0.50 ng/mL              PCT 0.26 - 0.50 ng/mL                AND       <80% decrease in PCT             Encourage initiation of                                             antibiotics       Encourage continuation           of antibiotics    ----------------------------     -----------------------------        PCT >= 0.50 ng/mL                  PCT > 0.50 ng/mL                AND         increase in PCT                  Strongly encourage  initiation of antibiotics    Strongly encourage escalation           of antibiotics                                     -----------------------------                                           PCT <= 0.25 ng/mL                                                 OR                                        > 80% decrease in PCT                                      Discontinue / Do not initiate                                             antibiotics  Performed at [facility], [address] [place], [place] [address]   Urinalysis, Routine w reflex microscopic -Urine, Clean Catch     Status: Abnormal   Collection Time: 04/17/22  6:14 AM  Result Value Ref Range   Color, Urine YELLOW YELLOW   APPearance CLEAR CLEAR   Specific Gravity, Urine 1.015 1.005 - 1.030   pH 6.0 5.0 - 8.0   Glucose, UA 150 (A) NEGATIVE mg/dL   Hgb urine dipstick SMALL (A) NEGATIVE   Bilirubin Urine NEGATIVE NEGATIVE   Ketones, ur NEGATIVE NEGATIVE mg/dL   Protein, ur 30 (A) NEGATIVE mg/dL   Nitrite NEGATIVE NEGATIVE   Leukocytes,Ua NEGATIVE NEGATIVE   RBC / HPF 0-5 0 - 5 RBC/hpf   WBC, UA 0-5 0 - 5 WBC/hpf   Bacteria, UA NONE SEEN NONE SEEN   Squamous Epithelial / HPF 0-5 0 - 5 /HPF    Comment: Performed at [facility], [place] [address] [place], [place] [address]  Resp panel by RT-PCR (RSV, Flu A&B, Covid) Urine, Clean Catch     Status: None   Collection Time: 04/17/22  6:14 AM   Specimen: Urine, Clean Catch; Nasal Swab  Result Value Ref Range   SARS Coronavirus 2 by RT PCR NEGATIVE NEGATIVE   Influenza A by PCR NEGATIVE NEGATIVE   Influenza B by PCR NEGATIVE NEGATIVE    Comment: (NOTE) The Xpert Xpress SARS-CoV-2/FLU/RSV plus assay is intended as an aid in the diagnosis of influenza from Nasopharyngeal swab specimens and should not be used as a sole basis for treatment. Nasal washings  and aspirates are unacceptable for Xpert Xpress SARS-CoV-2/FLU/RSV testing.  Fact Sheet for Patients: EntrepreneurPulse.com.au  Fact Sheet for Healthcare Providers: IncredibleEmployment.be  This test is not yet approved or cleared by the [place] and has been authorized  for detection and/or diagnosis of SARS-CoV-2 by FDA under an Emergency Use Authorization (EUA). This EUA will remain in effect (meaning this test can be used) for the duration of the COVID-19 declaration under Section 564(b)(1) of the Act, 21 U.S.C. section 360bbb-3(b)(1), unless the authorization is terminated or revoked.     Resp Syncytial Virus by PCR NEGATIVE NEGATIVE    Comment: (NOTE) Fact Sheet for Patients: EntrepreneurPulse.com.au  Fact Sheet for Healthcare Providers: IncredibleEmployment.be  This test is not yet approved or cleared by the [place] FDA and has been authorized for detection and/or diagnosis of SARS-CoV-2 by FDA under an Emergency Use Authorization (EUA). This EUA will remain in effect (meaning this test can be used) for the duration of the COVID-19 declaration under Section 564(b)(1) of the Act, 21 U.S.C. section 360bbb-3(b)(1), unless the authorization is terminated or revoked.  Performed at [facility], [place] [address] [place], [place] [address]   HIV Antibody (routine testing w rflx)     Status: None   Collection Time: 04/17/22  8:30 AM  Result Value Ref Range   HIV Screen 4th Generation wRfx Non Reactive Non Reactive    Comment: Performed at [facility], [place] [address], [place], [place] [address]  CBC     Status: Abnormal   Collection Time: 04/17/22  8:30 AM  Result Value Ref Range   WBC 13.0 (H) 4.0 - 10.5 K/uL   RBC 4.22 4.22 - 5.81 MIL/uL   Hemoglobin 14.4 13.0 - 17.0 g/dL   HCT 43.9 39.0 - 52.0 %   MCV 104.0 (H) 80.0 - 100.0 fL   MCH 34.1 (H) 26.0 - 34.0 pg   MCHC 32.8  30.0 - 36.0 g/dL   RDW 15.4 11.5 - 15.5 %   Platelets 185 150 - 400 K/uL   nRBC 0.0 0.0 - 0.2 %    Comment: Performed at [facility], [place] [address] [place], [place] [address]  Creatinine, serum     Status: Abnormal   Collection Time: 04/17/22  8:30 AM  Result Value Ref Range   Creatinine, Ser 1.47 (H) 0.61 - 1.24 mg/dL   GFR, Estimated 52 (L) >60 mL/min    Comment: (NOTE) Calculated using the CKD-EPI Creatinine Equation (2021) Performed at [place] [address] [place], [place] [address]   Brain natriuretic peptide     Status: Abnormal   Collection Time: 04/17/22  8:30 AM  Result Value Ref Range   B Natriuretic Peptide 1,006.1 (H) 0.0 - 100.0 pg/mL    Comment: Performed at [place] [address] [place], [place] [address]  CBG monitoring, ED     Status: Abnormal   Collection Time: 04/17/22  8:37 AM  Result Value Ref Range   Glucose-Capillary 181 (H) 70 - 99 mg/dL    Comment: Glucose reference range applies only to samples taken after fasting for at least 8 hours.  CBG monitoring, ED     Status: Abnormal   Collection Time: 04/17/22 11:07 AM  Result Value Ref Range   Glucose-Capillary 206 (H) 70 - 99 mg/dL    Comment: Glucose reference range applies only to samples taken after fasting for at least 8 hours.   CT L-SPINE NO CHARGE  Result Date: 04/17/2022 CLINICAL DATA:  Low back pain, increased fracture risk.  Back pain. EXAM: CT LUMBAR SPINE WITHOUT CONTRAST TECHNIQUE: Multidetector CT imaging of the lumbar spine was performed without intravenous contrast administration. Multiplanar CT image  reconstructions were also generated. RADIATION DOSE REDUCTION: This exam was performed according to the departmental dose-optimization program which includes automated exposure control, adjustment of the Tina Temme and/or kV according to patient size and/or use of iterative reconstruction technique. COMPARISON:  CT abdomen and pelvis performed today FINDINGS:  Segmentation: 5 lumbar type vertebrae. Alignment: Normal. Vertebrae: No acute fracture or focal pathologic process. Paraspinal and other soft tissues: Negative. Disc levels: Disc spaces maintained.  No disc herniation. IMPRESSION: No acute bony abnormality. Electronically Signed   By: Rolm Baptise M.D.   On: 04/17/2022 02:15   CT ABDOMEN PELVIS WO CONTRAST  Result Date: 04/17/2022 CLINICAL DATA:  Abdominal/flank pain, stone suspected.  Back pain. EXAM: CT ABDOMEN AND PELVIS WITHOUT CONTRAST TECHNIQUE: Multidetector CT imaging of the abdomen and pelvis was performed following the standard protocol without IV contrast. RADIATION DOSE REDUCTION: This exam was performed according to the departmental dose-optimization program which includes automated exposure control, adjustment of the Antonae Zbikowski and/or kV according to patient size and/or use of iterative reconstruction technique. COMPARISON:  None Available. FINDINGS: Lower chest: No acute abnormality Hepatobiliary: No focal hepatic abnormality. Gallbladder unremarkable. Pancreas: No focal abnormality or ductal dilatation. Spleen: No focal abnormality.  Normal size. Adrenals/Urinary Tract: No adrenal abnormality. No focal renal abnormality. No stones or hydronephrosis. Urinary bladder is unremarkable. Stomach/Bowel: Normal appendix. Stomach, large and small bowel grossly unremarkable. Vascular/Lymphatic: Aortic atherosclerosis. No aneurysm. Shotty retroperitoneal lymph nodes measuring up to 8 mm in short axis diameter. No pathologically enlarged lymph nodes. Reproductive: No visible focal abnormality. Other: No free fluid or free air. Musculoskeletal: No acute bony abnormality. IMPRESSION: No acute findings in the abdomen or pelvis. Aortic atherosclerosis. Electronically Signed   By: Rolm Baptise M.D.   On: 04/17/2022 02:14   DG Chest Port 1 View  Result Date: 04/17/2022 CLINICAL DATA:  Chest pain EXAM: PORTABLE CHEST 1 VIEW COMPARISON:  08/08/2021 FINDINGS: Cardiac  shadow is enlarged but stable. Postsurgical changes are again noted. Lungs are well aerated bilaterally. Central vascular congestion without significant edema is noted. No bony abnormality is noted. IMPRESSION: Changes of mild CHF. Electronically Signed   By: Inez Catalina M.D.   On: 04/17/2022 00:40    Pending Labs Unresulted Labs (From admission, onward)     Start     Ordered   04/24/22 0500  Creatinine, serum  (enoxaparin (LOVENOX)    CrCl >/= 30 ml/min)  Weekly,   R     Comments: while on enoxaparin therapy    04/17/22 0746   04/18/22 0500  Comprehensive metabolic panel  Tomorrow morning,   R        04/17/22 0746   04/18/22 0500  CBC  Tomorrow morning,   R        04/17/22 0746   04/17/22 0753  Hemoglobin A1c  Once,   R       Comments: To assess prior glycemic control    04/17/22 0752   04/17/22 0719  Culture, blood (Routine X 2) w Reflex to ID Panel  BLOOD CULTURE X 2,   R (with STAT occurrences)      04/17/22 0718   04/17/22 0719  Respiratory (~20 pathogens) panel by PCR  (Respiratory panel by PCR (~20 pathogens, ~24 hr TAT)  w precautions)  Once,   URGENT        04/17/22 0718            Vitals/Pain Today's Vitals   04/17/22 1154 04/17/22 1154 04/17/22 1215 04/17/22 1245  BP: Marland Kitchen)  84/54  (!) 122/91 (!) 90/41  Pulse: 80  86 82  Resp: 20  (!) 28 19  Temp:  99.6 F (37.6 C)    TempSrc:      SpO2: 91%  92% 95%  Weight:      Height:      PainSc:        Isolation Precautions Droplet precaution  Medications Medications  enoxaparin (LOVENOX) injection 40 mg (40 mg Subcutaneous Given 04/17/22 0844)  sodium chloride flush (NS) 0.9 % injection 3 mL (has no administration in time range)  acetaminophen (TYLENOL) tablet 650 mg (650 mg Oral Given 04/17/22 0844)    Or  acetaminophen (TYLENOL) suppository 650 mg ( Rectal See Alternative 04/17/22 0844)  ondansetron (ZOFRAN) tablet 4 mg (has no administration in time range)    Or  ondansetron (ZOFRAN) injection 4 mg (has no  administration in time range)  insulin aspart (novoLOG) injection 0-15 Units (3 Units Subcutaneous Given 04/17/22 1110)  insulin aspart (novoLOG) injection 0-5 Units (has no administration in time range)  oxyCODONE (Oxy IR/ROXICODONE) immediate release tablet 5-10 mg (has no administration in time range)  morphine (PF) 2 MG/ML injection 1-4 mg (4 mg Intravenous Given 04/17/22 1001)  cefTRIAXone (ROCEPHIN) 1 g in sodium chloride 0.9 % 100 mL IVPB (0 g Intravenous Stopped 04/17/22 1000)  azithromycin (ZITHROMAX) 500 mg in sodium chloride 0.9 % 250 mL IVPB (0 mg Intravenous Stopped 04/17/22 1107)  aspirin EC tablet 81 mg (81 mg Oral Given 04/17/22 0957)  rosuvastatin (CRESTOR) tablet 20 mg (has no administration in time range)  insulin glargine-yfgn (SEMGLEE) injection 50 Units (50 Units Subcutaneous Given 04/17/22 1050)  famotidine (PEPCID) tablet 20 mg (20 mg Oral Given 04/17/22 0957)  polyethylene glycol (MIRALAX / GLYCOLAX) packet 17 g (17 g Oral Not Given 04/17/22 0958)  pregabalin (LYRICA) capsule 50 mg (has no administration in time range)  predniSONE (DELTASONE) tablet 40 mg (40 mg Oral Given 04/17/22 0957)  ipratropium-albuterol (DUONEB) 0.5-2.5 (3) MG/3ML nebulizer solution 3 mL (3 mLs Nebulization Given 04/17/22 1321)    Followed by  ipratropium-albuterol (DUONEB) 0.5-2.5 (3) MG/3ML nebulizer solution 3 mL (has no administration in time range)  potassium chloride SA (KLOR-CON M) CR tablet 20 mEq (20 mEq Oral Given 04/17/22 0957)  metoprolol tartrate (LOPRESSOR) injection 2.5 mg (2.5 mg Intravenous Not Given 04/17/22 1243)  oxyCODONE (Oxy IR/ROXICODONE) immediate release tablet 5 mg (5 mg Oral Given 04/17/22 0041)  HYDROmorphone (DILAUDID) injection 1 mg (1 mg Intravenous Given 04/17/22 0113)  acetaminophen (TYLENOL) tablet 1,000 mg (1,000 mg Oral Given 04/17/22 0639)  furosemide (LASIX) injection 20 mg (20 mg Intravenous Given 04/17/22 0726)  magnesium sulfate IVPB 2 g 50 mL (0 g Intravenous Stopped 04/17/22 0953)     Mobility walks with device     Focused Assessments Cardiac Assessment Handoff:  Cardiac Rhythm: Sinus tachycardia Lab Results  Component Value Date   CKTOTAL 147 07/03/2019   CKMB 1.8 04/06/2009   TROPONINI 0.03        NO INDICATION OF MYOCARDIAL INJURY. 04/06/2009   No results found for: "DDIMER" Does the Patient currently have chest pain? No   , Pulmonary Assessment Handoff:  Lung sounds: L Breath Sounds: Diminished R Breath Sounds: Diminished O2 Device: Nasal Cannula O2 Flow Rate (L/min): 2 L/min    R Recommendations: See Admitting Provider Note  Report given to:   Additional Notes:

## 2022-04-17 NOTE — ED Notes (Signed)
Pt took his losartan and metoprolol an hour ago as scheduled. Current bp is 84/54. Pt states his bp sometimes would drop as low as 90/60 after taking his meds. Alert and oriented x 4. Reports feeling more restless. Morphine helped his back pain. Pt has been more sweaty and restless. Tylenol given earlier for fever. Current temp is 99.56F oral. Providers notified if can have anything else for fever. Will continue to monitor.

## 2022-04-17 NOTE — ED Provider Notes (Addendum)
[facility] Emergency Department Provider Note MRN:  [id number]  Arrival date & time: 04/17/22     Chief Complaint   Back Pain   History of Present Illness   [name] [name] is a 67 y.o. year-old male with a history of CAD, CABG, diabetes presenting to the ED with chief complaint of back pain.  Right-sided lower back pain sudden onset about 2 hours ago.  Increased swelling in the legs recently.  Had some brief chest pain that started 1 or 2 hours ago but he thinks this is a different kind of mild pain that went away after taking Tylenol.  No current chest pain.  No shortness of breath, no abdominal pain.  No fever.  No cough.  Review of Systems  A thorough review of systems was obtained and all systems are negative except as noted in the HPI and PMH.   Patient's Health History    Past Medical History:  Diagnosis Date   Acute myocardial infarction, unspecified site, episode of care unspecified 2005   Acute pancreatitis    CAD (coronary artery disease)    a. CABG in 2005 w LIMA to LAD, left radial to second circumflex marginal, saphenous vein graft to PDA, saphenous vein graft to lateral subbrach of ramus intermediate, and sequential saphenous vein graft to the medial subbranch of ramus intermediate.   Chronic diastolic CHF (congestive heart failure) (HCC)    Cirrhosis of liver without mention of alcohol    CKD (chronic kidney disease), stage II    Complications affecting other specified body systems, hypertension    Ectopic atrial rhythm    Essential hypertension    Hyperlipidemia    Hypokalemia    a. intermitttent noncompliance with potassium supplement.   Hypomagnesemia    Morbid obesity (HCC)    Neuropathy    Other and unspecified hyperlipidemia    Proteinuria    RBBB    Sleep apnea    Type II or unspecified type diabetes mellitus without mention of complication, not stated as uncontrolled    Varicose veins of both lower extremities     Past  Surgical History:  Procedure Laterality Date   CORONARY ARTERY BYPASS GRAFT     2005   I & D EXTREMITY Left 09/02/2019   Procedure: IRRIGATION AND DEBRIDEMENT 4th TOE DEBRIDIMENT;  Surgeon: Evelina Bucy, DPM;  Location: [facility];  Service: Podiatry;  Laterality: Left;    Family History  Problem Relation Age of Onset   Breast cancer Mother    Prostate cancer Father    Coronary artery disease Father        underwent CABG    Social History   Socioeconomic History   Marital status: Significant Other    Spouse name: Not on file   Number of children: Not on file   Years of education: Not on file   Highest education level: Not on file  Occupational History   Occupation: Disabled     Comment: previously worked in delivery   Tobacco Use   Smoking status: Former    Packs/day: 1.50    Years: 30.00    Total pack years: 45.00    Types: Cigarettes    Quit date: 10/20/2004    Years since quitting: 17.5   Smokeless tobacco: Never  Vaping Use   Vaping Use: Never used  Substance and Sexual Activity   Alcohol use: Yes    Comment: occasional   Drug use: No   Sexual activity: Not on  file  Other Topics Concern   Not on file  Social History Narrative   Lives with significant other x 30+ years, apartment "friends smoke". Disability. Is disabled secondary to his work-related injury.       Has over 20 grandchildren    Social Determinants of Health   Financial Resource Strain: Low Risk  (11/28/2021)   Overall Financial Resource Strain (CARDIA)    Difficulty of Paying Living Expenses: Not hard at all  Food Insecurity: No Food Insecurity (04/16/2022)   Hunger Vital Sign    Worried About Running Out of Food in the Last Year: Never true    Ran Out of Food in the Last Year: Never true  Transportation Needs: No Transportation Needs (04/16/2022)   PRAPARE - Hydrologist (Medical): No    Lack of Transportation (Non-Medical): No  Physical Activity: Sufficiently Active  (11/28/2021)   Exercise Vital Sign    Days of Exercise per Week: 5 days    Minutes of Exercise per Session: 30 min  Stress: No Stress Concern Present (11/28/2021)   [place]    Feeling of Stress : Not at all  Social Connections: Socially Isolated (11/28/2021)   Social Connection and Isolation Panel [NHANES]    Frequency of Communication with Friends and Family: More than three times a week    Frequency of Social Gatherings with Friends and Family: More than three times a week    Attends Religious Services: Never    Marine scientist or Organizations: No    Attends Archivist Meetings: Never    Marital Status: Never married  Intimate Partner Violence: Not At Risk (11/28/2021)   Humiliation, Afraid, Rape, and Kick questionnaire    Fear of Current or Ex-Partner: No    Emotionally Abused: No    Physically Abused: No    Sexually Abused: No     Physical Exam   Vitals:   04/17/22 0600 04/17/22 0602  BP: (!) 125/96   Pulse: (!) 125   Resp: (!) 28   Temp:  (!) 100.7 F (38.2 C)  SpO2: 94%     CONSTITUTIONAL: Chronically ill-appearing, NAD NEURO/PSYCH:  Alert and oriented x 3, normal and symmetric strength and sensation, normal coordination, normal speech EYES:  eyes equal and reactive ENT/NECK:  no LAD, no JVD CARDIO: Regular rate, well-perfused, normal S1 and S2 PULM:  CTAB no wheezing or rhonchi GI/GU:  non-distended, non-tender MSK/SPINE:  No gross deformities, no edema SKIN:  no rash, atraumatic   *Additional and/or pertinent findings included in MDM below  Diagnostic and Interventional Summary    EKG Interpretation  Date/Time:  Thursday April 17 2022 00:12:21 EST Ventricular Rate:  96 PR Interval:  170 QRS Duration: 119 QT Interval:  356 QTC Calculation: 450 R Axis:   -62 Text Interpretation: Sinus rhythm Atrial premature complexes Incomplete RBBB and LAFB LVH by voltage Confirmed  by Gerlene Fee [phone]) on 04/17/2022 12:56:56 AM       Labs Reviewed  CBC - Abnormal; Notable for the following components:      Result Value   WBC 10.6 (*)    RBC 4.04 (*)    MCV 103.0 (*)    MCH 34.7 (*)    All other components within normal limits  COMPREHENSIVE METABOLIC PANEL - Abnormal; Notable for the following components:   Glucose, Bld 217 (*)    BUN 24 (*)    Creatinine, Ser 1.62 (*)  Total Protein 8.2 (*)    Albumin 3.1 (*)    GFR, Estimated 46 (*)    All other components within normal limits  LIPASE, BLOOD - Abnormal; Notable for the following components:   Lipase 60 (*)    All other components within normal limits  URINALYSIS, ROUTINE W REFLEX MICROSCOPIC - Abnormal; Notable for the following components:   Glucose, UA 150 (*)    Hgb urine dipstick SMALL (*)    Protein, ur 30 (*)    All other components within normal limits  RESP PANEL BY RT-PCR (RSV, FLU A&B, COVID)  [id number]  TROPONIN I (HIGH SENSITIVITY)  TROPONIN I (HIGH SENSITIVITY)    CT ABDOMEN PELVIS WO CONTRAST  Final Result    CT L-SPINE NO CHARGE  Final Result    DG Chest Port 1 View  Final Result      Medications  furosemide (LASIX) injection 20 mg (has no administration in time range)  oxyCODONE (Oxy IR/ROXICODONE) immediate release tablet 5 mg (5 mg Oral Given 04/17/22 0041)  HYDROmorphone (DILAUDID) injection 1 mg (1 mg Intravenous Given 04/17/22 0113)  acetaminophen (TYLENOL) tablet 1,000 mg (1,000 mg Oral Given 04/17/22 [id number])     Procedures  /  Critical Care Procedures  ED Course and Medical Decision Making  Initial Impression and Ddx Differential diagnosis includes MSK back pain, AAA, occult compression fracture.  Given patient's age and extensive past medical history will obtain CT imaging.  Chest pain seems MSK but given history will obtain EKG, troponin x 2.  Doubt dissection.  Doubt PE.  Past medical/surgical history that increases complexity of ED encounter: CAD,  diabetes  Interpretation of Diagnostics I personally reviewed the EKG and my interpretation is as follows: Sinus rhythm  Labs reassuring with no significant blood count or electrolyte disturbance.  Troponin negative x 2.  Chest x-ray is normal, CT abdomen pelvis without acute process.  Patient Reassessment and Ultimate Disposition/Management     Patient is now sitting comfortably in no acute distress.  Seemed appropriate for discharge.  Discharge vitals reveal fever and heart rate in the 120s.  Urinalysis and COVID/flu testing is negative.  Patient is now satting in the high 80s on 3 L nasal cannula, suspect pulmonary edema as the cause.  Will admit to medicine given this new oxygen requirement and fever of unknown source.  Patient management required discussion with the following services or consulting groups:  None  Complexity of Problems Addressed Acute illness or injury that poses threat of life of bodily function  Additional Data Reviewed and Analyzed Further history obtained from: Prior labs/imaging results  Additional Factors Impacting ED Encounter Risk Use of parenteral controlled substances  Barth Kirks. [place] Small, [place] [email or web address]  Final Clinical Impressions(s) / ED Diagnoses     ICD-10-CM   1. Acute right-sided low back pain without sciatica  M54.50     2. Chest pain, unspecified type  R07.9     3. Acute pulmonary edema (HCC)  J81.0     4. Hypoxia  R09.02       ED Discharge Orders          Ordered    lidocaine (LIDODERM) 5 %  Every 24 hours        04/17/22 0429             Discharge Instructions Discussed with and Provided to Patient:     Discharge Instructions      You were evaluated  in the Emergency Department and after careful evaluation, we did not find any emergent condition requiring admission or further testing in the hospital.  Your exam/testing today is overall reassuring.  Use  the numbing patches provided as needed for pain, follow-up with your regular doctor.  Please return to the Emergency Department if you experience any worsening of your condition.   Thank you for allowing [place] to be a part of your care.       Maudie Flakes, MD 04/17/22 0430    Maudie Flakes, MD 04/17/22 [phone]

## 2022-04-17 NOTE — Progress Notes (Signed)
Echocardiogram 2D Echocardiogram has been performed.  Oneal Deputy Riyan Gavina RDCS 04/17/2022, 3:35 PM  At RN request, sent directly to 3E from echo.

## 2022-04-17 NOTE — ED Notes (Signed)
Taken to echo by transporter.

## 2022-04-17 NOTE — ED Triage Notes (Addendum)
Pt arrived via [facility] for cc of back pain, onset 1-2 hours ago. Pt seen recently seen at [facility] for facial droop, droop persists, undergoing treatment for bell's palsy. EMS notes increased edema in pt legs and abdomen as well as decreased lower breath sounds. PMH CHF, EMS administered 0.4 nitro and placed on 2L [place]. Pt denies shortness of breath, RA sat 94%.. Pt is alert and oriented, GCS 15. 18g LAC.  PTA EMS Vitals  BP 140/90 HR 90 RR 20 SPO2 96% CBG 349

## 2022-04-18 ENCOUNTER — Inpatient Hospital Stay (HOSPITAL_COMMUNITY): Payer: 59

## 2022-04-18 ENCOUNTER — Observation Stay (HOSPITAL_COMMUNITY): Payer: 59

## 2022-04-18 DIAGNOSIS — N170 Acute kidney failure with tubular necrosis: Secondary | ICD-10-CM | POA: Diagnosis present

## 2022-04-18 DIAGNOSIS — I50810 Right heart failure, unspecified: Secondary | ICD-10-CM

## 2022-04-18 DIAGNOSIS — E1151 Type 2 diabetes mellitus with diabetic peripheral angiopathy without gangrene: Secondary | ICD-10-CM | POA: Diagnosis present

## 2022-04-18 DIAGNOSIS — I361 Nonrheumatic tricuspid (valve) insufficiency: Secondary | ICD-10-CM | POA: Diagnosis not present

## 2022-04-18 DIAGNOSIS — I509 Heart failure, unspecified: Secondary | ICD-10-CM | POA: Diagnosis not present

## 2022-04-18 DIAGNOSIS — I130 Hypertensive heart and chronic kidney disease with heart failure and stage 1 through stage 4 chronic kidney disease, or unspecified chronic kidney disease: Secondary | ICD-10-CM | POA: Diagnosis not present

## 2022-04-18 DIAGNOSIS — I2609 Other pulmonary embolism with acute cor pulmonale: Secondary | ICD-10-CM | POA: Diagnosis not present

## 2022-04-18 DIAGNOSIS — E1142 Type 2 diabetes mellitus with diabetic polyneuropathy: Secondary | ICD-10-CM | POA: Diagnosis present

## 2022-04-18 DIAGNOSIS — R7881 Bacteremia: Secondary | ICD-10-CM | POA: Diagnosis present

## 2022-04-18 DIAGNOSIS — J9601 Acute respiratory failure with hypoxia: Secondary | ICD-10-CM | POA: Diagnosis not present

## 2022-04-18 DIAGNOSIS — I071 Rheumatic tricuspid insufficiency: Secondary | ICD-10-CM | POA: Diagnosis not present

## 2022-04-18 DIAGNOSIS — I50811 Acute right heart failure: Secondary | ICD-10-CM | POA: Diagnosis not present

## 2022-04-18 DIAGNOSIS — M7989 Other specified soft tissue disorders: Secondary | ICD-10-CM | POA: Diagnosis not present

## 2022-04-18 DIAGNOSIS — R0902 Hypoxemia: Secondary | ICD-10-CM | POA: Diagnosis not present

## 2022-04-18 DIAGNOSIS — R918 Other nonspecific abnormal finding of lung field: Secondary | ICD-10-CM | POA: Diagnosis not present

## 2022-04-18 DIAGNOSIS — Z794 Long term (current) use of insulin: Secondary | ICD-10-CM | POA: Diagnosis not present

## 2022-04-18 DIAGNOSIS — Z6841 Body Mass Index (BMI) 40.0 and over, adult: Secondary | ICD-10-CM | POA: Diagnosis not present

## 2022-04-18 DIAGNOSIS — N179 Acute kidney failure, unspecified: Secondary | ICD-10-CM | POA: Diagnosis not present

## 2022-04-18 DIAGNOSIS — J810 Acute pulmonary edema: Secondary | ICD-10-CM | POA: Diagnosis present

## 2022-04-18 DIAGNOSIS — M5126 Other intervertebral disc displacement, lumbar region: Secondary | ICD-10-CM | POA: Diagnosis not present

## 2022-04-18 DIAGNOSIS — J9690 Respiratory failure, unspecified, unspecified whether with hypoxia or hypercapnia: Secondary | ICD-10-CM | POA: Diagnosis not present

## 2022-04-18 DIAGNOSIS — E871 Hypo-osmolality and hyponatremia: Secondary | ICD-10-CM | POA: Diagnosis present

## 2022-04-18 DIAGNOSIS — N189 Chronic kidney disease, unspecified: Secondary | ICD-10-CM | POA: Diagnosis not present

## 2022-04-18 DIAGNOSIS — I088 Other rheumatic multiple valve diseases: Secondary | ICD-10-CM | POA: Diagnosis not present

## 2022-04-18 DIAGNOSIS — I252 Old myocardial infarction: Secondary | ICD-10-CM | POA: Diagnosis not present

## 2022-04-18 DIAGNOSIS — R609 Edema, unspecified: Secondary | ICD-10-CM

## 2022-04-18 DIAGNOSIS — K7460 Unspecified cirrhosis of liver: Secondary | ICD-10-CM | POA: Diagnosis present

## 2022-04-18 DIAGNOSIS — I5033 Acute on chronic diastolic (congestive) heart failure: Secondary | ICD-10-CM

## 2022-04-18 DIAGNOSIS — D696 Thrombocytopenia, unspecified: Secondary | ICD-10-CM | POA: Diagnosis present

## 2022-04-18 DIAGNOSIS — I340 Nonrheumatic mitral (valve) insufficiency: Secondary | ICD-10-CM | POA: Diagnosis not present

## 2022-04-18 DIAGNOSIS — Z1152 Encounter for screening for COVID-19: Secondary | ICD-10-CM | POA: Diagnosis not present

## 2022-04-18 DIAGNOSIS — R1110 Vomiting, unspecified: Secondary | ICD-10-CM | POA: Diagnosis not present

## 2022-04-18 DIAGNOSIS — I8393 Asymptomatic varicose veins of bilateral lower extremities: Secondary | ICD-10-CM | POA: Diagnosis present

## 2022-04-18 DIAGNOSIS — I5082 Biventricular heart failure: Secondary | ICD-10-CM | POA: Diagnosis present

## 2022-04-18 DIAGNOSIS — N1831 Chronic kidney disease, stage 3a: Secondary | ICD-10-CM | POA: Diagnosis present

## 2022-04-18 DIAGNOSIS — J811 Chronic pulmonary edema: Secondary | ICD-10-CM | POA: Diagnosis not present

## 2022-04-18 DIAGNOSIS — K0889 Other specified disorders of teeth and supporting structures: Secondary | ICD-10-CM | POA: Diagnosis not present

## 2022-04-18 DIAGNOSIS — E1165 Type 2 diabetes mellitus with hyperglycemia: Secondary | ICD-10-CM | POA: Diagnosis present

## 2022-04-18 DIAGNOSIS — I700 Atherosclerosis of aorta: Secondary | ICD-10-CM | POA: Diagnosis not present

## 2022-04-18 DIAGNOSIS — Z87891 Personal history of nicotine dependence: Secondary | ICD-10-CM | POA: Diagnosis not present

## 2022-04-18 DIAGNOSIS — K429 Umbilical hernia without obstruction or gangrene: Secondary | ICD-10-CM | POA: Diagnosis not present

## 2022-04-18 DIAGNOSIS — I2510 Atherosclerotic heart disease of native coronary artery without angina pectoris: Secondary | ICD-10-CM | POA: Diagnosis not present

## 2022-04-18 DIAGNOSIS — E1122 Type 2 diabetes mellitus with diabetic chronic kidney disease: Secondary | ICD-10-CM | POA: Diagnosis not present

## 2022-04-18 DIAGNOSIS — I499 Cardiac arrhythmia, unspecified: Secondary | ICD-10-CM | POA: Diagnosis not present

## 2022-04-18 DIAGNOSIS — E11649 Type 2 diabetes mellitus with hypoglycemia without coma: Secondary | ICD-10-CM | POA: Diagnosis not present

## 2022-04-18 DIAGNOSIS — Z8616 Personal history of COVID-19: Secondary | ICD-10-CM | POA: Diagnosis not present

## 2022-04-18 DIAGNOSIS — A400 Sepsis due to streptococcus, group A: Secondary | ICD-10-CM | POA: Diagnosis present

## 2022-04-18 LAB — COMPREHENSIVE METABOLIC PANEL
ALT: 28 U/L (ref 0–44)
AST: 75 U/L — ABNORMAL HIGH (ref 15–41)
Albumin: 2.8 g/dL — ABNORMAL LOW (ref 3.5–5.0)
Alkaline Phosphatase: 99 U/L (ref 38–126)
Anion gap: 15 (ref 5–15)
BUN: 34 mg/dL — ABNORMAL HIGH (ref 8–23)
CO2: 20 mmol/L — ABNORMAL LOW (ref 22–32)
Calcium: 8.4 mg/dL — ABNORMAL LOW (ref 8.9–10.3)
Chloride: 98 mmol/L (ref 98–111)
Creatinine, Ser: 2.95 mg/dL — ABNORMAL HIGH (ref 0.61–1.24)
GFR, Estimated: 22 mL/min — ABNORMAL LOW (ref >60)
Glucose, Bld: 208 mg/dL — ABNORMAL HIGH (ref 70–99)
Potassium: 4.5 mmol/L (ref 3.5–5.1)
Sodium: 133 mmol/L — ABNORMAL LOW (ref 135–145)
Total Bilirubin: 3.2 mg/dL — ABNORMAL HIGH (ref 0.3–1.2)
Total Protein: 7.9 g/dL (ref 6.5–8.1)

## 2022-04-18 LAB — GLUCOSE, CAPILLARY
Glucose-Capillary: 261 mg/dL — ABNORMAL HIGH (ref 70–99)
Glucose-Capillary: 251 mg/dL — ABNORMAL HIGH (ref 70–99)
Glucose-Capillary: 248 mg/dL — ABNORMAL HIGH (ref 70–99)
Glucose-Capillary: 271 mg/dL — ABNORMAL HIGH (ref 70–99)

## 2022-04-18 LAB — CBC
HCT: 40.5 % (ref 39.0–52.0)
Hemoglobin: 14 g/dL (ref 13.0–17.0)
MCH: 35.4 pg — ABNORMAL HIGH (ref 26.0–34.0)
MCHC: 34.6 g/dL (ref 30.0–36.0)
MCV: 102.3 fL — ABNORMAL HIGH (ref 80.0–100.0)
Platelets: 155 10*3/uL (ref 150–400)
RBC: 3.96 MIL/uL — ABNORMAL LOW (ref 4.22–5.81)
RDW: 15.7 % — ABNORMAL HIGH (ref 11.5–15.5)
WBC: 17.5 10*3/uL — ABNORMAL HIGH (ref 4.0–10.5)
nRBC: 0.2 % (ref 0.0–0.2)

## 2022-04-18 LAB — BRAIN NATRIURETIC PEPTIDE: B Natriuretic Peptide: 938.7 pg/mL — ABNORMAL HIGH (ref 0.0–100.0)

## 2022-04-18 LAB — STREP PNEUMONIAE URINARY ANTIGEN: Strep Pneumo Urinary Antigen: NEGATIVE

## 2022-04-18 MED ORDER — HEPARIN (PORCINE) 25000 UT/250ML-% IV SOLN
2050.0000 [IU]/h | INTRAVENOUS | Status: DC
  Administered 2022-04-18: 1700 [IU]/h via INTRAVENOUS
  Administered 2022-04-19: 1900 [IU]/h via INTRAVENOUS
  Administered 2022-04-19: 1700 [IU]/h via INTRAVENOUS
  Administered 2022-04-20 (×2): 2100 [IU]/h via INTRAVENOUS
  Administered 2022-04-21: 2050 [IU]/h via INTRAVENOUS
  Administered 2022-04-21: 1950 [IU]/h via INTRAVENOUS
  Administered 2022-04-22 – 2022-04-25 (×6): 2050 [IU]/h via INTRAVENOUS
  Filled 2022-04-18 (×14): qty 250

## 2022-04-18 MED ORDER — METOPROLOL TARTRATE 12.5 MG HALF TABLET
12.5000 mg | ORAL_TABLET | Freq: Two times a day (BID) | ORAL | Status: DC
  Administered 2022-04-18 – 2022-04-26 (×16): 12.5 mg via ORAL
  Filled 2022-04-18 (×16): qty 1

## 2022-04-18 MED ORDER — LORAZEPAM 2 MG/ML IJ SOLN
1.0000 mg | Freq: Once | INTRAMUSCULAR | Status: AC | PRN
  Administered 2022-04-18: 1 mg via INTRAVENOUS
  Filled 2022-04-18: qty 1

## 2022-04-18 MED ORDER — HEPARIN BOLUS VIA INFUSION
3000.0000 [IU] | Freq: Once | INTRAVENOUS | Status: AC
  Administered 2022-04-18: 3000 [IU] via INTRAVENOUS
  Filled 2022-04-18: qty 3000

## 2022-04-18 MED ORDER — TECHNETIUM TO 99M ALBUMIN AGGREGATED
4.0000 | Freq: Once | INTRAVENOUS | Status: AC | PRN
  Administered 2022-04-18: 4 via INTRAVENOUS

## 2022-04-18 MED ORDER — INSULIN ASPART 100 UNIT/ML IJ SOLN
5.0000 [IU] | Freq: Three times a day (TID) | INTRAMUSCULAR | Status: DC
  Administered 2022-04-18 – 2022-04-19 (×3): 5 [IU] via SUBCUTANEOUS

## 2022-04-18 NOTE — Consult Note (Signed)
Cardiology Consultation   Patient ID: [name] [name] MRN: [id number]; DOB: Sep 10, 1955  Admit date: 04/16/2022 Date of Consult: 04/18/2022  PCP:  [name], MD   [facility] Cardiologist:  Candee Furbish, MD   Patient Profile:   [name] [name] is a 67 y.o. male with a hx of CAD s/p CABG, chronic diastolic heart failure, hypertension, liver cirrhosis, hypokalemia, OSA, RBBB, history of ectopic atrial rhythm, CKD, hyperlipidemia, morbid obesity, DM2 , PVD, who is being seen 04/18/2022 for the evaluation of right heart failure at the request of Dr. Florene Glen.  History of Present Illness:   [name] has a history of CAD s/p CABG with LIMA-LAD, left radial-OM2, SVG-PDA, SVG-ramus in 2005.  Nuclear stress test 11/2013 was low risk without ischemia.  Chronic diastolic heart failure has been managed with 40 mg torsemide daily, 25 mg eplerenone daily, and potassium supplementation.  He has a history of hypokalemia.  He is intolerant to SGLT2 inhibitors due to prior yeast infection.  He was last seen in clinic by Dr. Marlou Porch on 04/08/2022 was doing well at that time.  He presented to [facility] ER 04/12/2022 with strokelike symptoms including right-sided facial weakness and slurred speech.  MRI did not show acute changes and he was diagnosed with Bell's palsy.  He was discharged with prednisone without admission.  He returned to the ER 04/16/2022 via EMS for back pain lower extremity swelling, shortness of breath, and new hypoxia.  His primary complaint was right-sided lower back pain. He had a new oxygen requirement and was febrile and therefore was admitted.  He had COVID infection about 2 weeks ago.  He was started on empiric antibiotics Rocephin and Zithromax.  Blood cultures pending.  Lower extremity duplex for lower extremity swelling was negative for DVT.  Echocardiogram showed continued preserved LVEF of 55-60%, flattened septum in diastole consistent with RV volume overload, severe TR,  severe right LAE, mild MR.   Cardiology was consulted. He was initially diuresed but sCr elevated to 2.96 from baseline 1.4-1.5. Holding further diuresis. During my exam, he reports 1 week of lower extremity swelling. He chronically sleeps with HOB raised since CABG. He denies SOB prior to admission. He had COVID about 3 weeks ago and recovered well. He denies chest pain, palpations, and syncope.    Past Medical History:  Diagnosis Date   Acute myocardial infarction, unspecified site, episode of care unspecified 2005   Acute pancreatitis    CAD (coronary artery disease)    a. CABG in 2005 w LIMA to LAD, left radial to second circumflex marginal, saphenous vein graft to PDA, saphenous vein graft to lateral subbrach of ramus intermediate, and sequential saphenous vein graft to the medial subbranch of ramus intermediate.   Chronic diastolic CHF (congestive heart failure) (HCC)    Cirrhosis of liver without mention of alcohol    CKD (chronic kidney disease), stage II    Complications affecting other specified body systems, hypertension    Ectopic atrial rhythm    Essential hypertension    Hyperlipidemia    Hypokalemia    a. intermitttent noncompliance with potassium supplement.   Hypomagnesemia    Morbid obesity (HCC)    Neuropathy    Other and unspecified hyperlipidemia    Proteinuria    RBBB    Sleep apnea    Type II or unspecified type diabetes mellitus without mention of complication, not stated as uncontrolled    Varicose veins of both lower extremities  Past Surgical History:  Procedure Laterality Date   CORONARY ARTERY BYPASS GRAFT     2005   I & D EXTREMITY Left 09/02/2019   Procedure: IRRIGATION AND DEBRIDEMENT 4th TOE DEBRIDIMENT;  Surgeon: Evelina Bucy, DPM;  Location: [facility] ORS;  Service: Podiatry;  Laterality: Left;     Home Medications:  Prior to Admission medications   Medication Sig Start Date End Date Taking? Authorizing Provider  aspirin EC 81 MG tablet Take  81 mg by mouth daily.   Yes [provider]  diclofenac Sodium (VOLTAREN) 1 % GEL APPLY 2 GRAMS TOPICALLY 4 TIMES DAILY TO KNEE 05/27/21  Yes [name], MD  eplerenone (INSPRA) 25 MG tablet TAKE 1 TABLET(25 MG) BY MOUTH DAILY 10/21/21  Yes [name], MD  famotidine (PEPCID) 20 MG tablet TAKE 1 TABLET(20 MG) BY MOUTH DAILY Patient taking differently: Take 20 mg by mouth daily as needed for heartburn. 11/13/21  Yes [name], MD  fluticasone (FLONASE) 50 MCG/ACT nasal spray Place 1 spray into both nostrils daily. 05/27/21  Yes [name], MD  Fluticasone-Umeclidin-Vilant (TRELEGY ELLIPTA) 100-62.5-25 MCG/ACT AEPB Inhale 1 puff into the lungs daily. 09/30/21  Yes Young, Clinton D, MD  HUMALOG 100 UNIT/ML injection Inject 40 Units into the skin See admin instructions. Per sliding scale patient injects 40 units if blood sugar is 400. 07/16/21  Yes [provider]  HUMALOG KWIKPEN 200 UNIT/ML KwikPen Inject into the skin. 05/21/21  Yes [provider]  hydroxypropyl methylcellulose / hypromellose (ISOPTO TEARS / GONIOVISC) 2.5 % ophthalmic solution Place 2 drops into both eyes as needed for dry eyes. 04/15/22  Yes [name], MD  LANTUS SOLOSTAR 100 UNIT/ML Solostar Pen Inject 50 Units into the skin every morning. 09/22/19  Yes [provider]  lidocaine (LIDODERM) 5 % Place 1 patch onto the skin daily. Remove & Discard patch within 12 hours or as directed by MD 04/17/22  Yes Maudie Flakes, MD  losartan (COZAAR) 50 MG tablet TAKE 1/2 TABLET(25 MG) BY MOUTH DAILY 04/14/22  Yes [name], MD  MAGNESIUM-OXIDE 400 (241.3 Mg) MG tablet Take 1 tablet by mouth daily. 10/06/19  Yes [provider]  metoprolol tartrate (LOPRESSOR) 50 MG tablet TAKE 1 TABLET(50 MG) BY MOUTH TWICE DAILY Patient taking differently: Take 50 mg by mouth 2 (two) times daily. 08/14/21  Yes [name], MD  nitroGLYCERIN (NITROSTAT) 0.4 MG SL tablet Place 1 tablet (0.4 mg  total) under the tongue every 5 (five) minutes as needed for chest pain. 08/21/21  Yes [name], MD  polyethylene glycol (MIRALAX / GLYCOLAX) 17 g packet Take 17 g by mouth daily. 09/03/19  Yes Bonnielee Haff, MD  potassium chloride SA (KLOR-CON M) 20 MEQ tablet TAKE 2 TABLETS(40 MEQ) BY MOUTH THREE TIMES DAILY 01/09/22  Yes [name], MD  predniSONE (DELTASONE) 10 MG tablet Take 6 tablets (60 mg total) by mouth daily for 7 days. 04/12/22 04/19/22 Yes Godfrey Pick, MD  PROAIR HFA [phone] Base) MCG/ACT inhaler Inhale 2 puffs into the lungs every 6 (six) hours as needed for wheezing or shortness of breath. 09/05/21  Yes Cobb, Karie Schwalbe, NP  silver sulfADIAZINE (SSD) 1 % cream APPLY PEA SIZED AMOUNT TOPICALLY TO WOUND DAILY 01/07/21  Yes [name], MD  torsemide (DEMADEX) 20 MG tablet TAKE 1 TABLET(20 MG) BY MOUTH TWICE DAILY Patient taking differently: Take 40 mg by mouth daily. 08/14/21  Yes Candee Furbish  C, MD  B-D ULTRAFINE III SHORT PEN 31G X 8 MM MISC  03/11/16   [provider]  chlorhexidine (HIBICLENS) 4 % external liquid Apply topically daily as needed. Patient not taking: Reported on 04/17/2022 02/12/21   [name], MD  collagenase (SANTYL) ointment Apply 1 application topically daily. Right ankle measures 6.0 x 0.3cm. Patient not taking: Reported on 04/17/2022 07/15/19   Evelina Bucy, DPM  glucose blood [facility] ULTRA) test strip TEST AS DIRECTED 8 TIMES DAILY 01/31/22   Allwardt, Randa Evens, PA-C  HUMULIN R U-500 KWIKPEN 500 UNIT/ML kwikpen Inject 50-60 Units into the skin See admin instructions. 60 units in the AM, 50 units at lunch and 50 in the evening Patient not taking: Reported on 04/17/2022 01/12/20   [name], MD  ipratropium-albuterol (DUONEB) 0.5-2.5 (3) MG/3ML SOLN 1 vial in nebulizer up to 4 times daily Patient not taking: Reported on 04/17/2022 11/13/20   Deneise Lever, MD  Lancets (ACCU-CHEK MULTICLIX) lancets  02/15/16   [provider]   meloxicam (MOBIC) 15 MG tablet Take 1 tablet (15 mg total) by mouth daily. Patient not taking: Reported on 04/17/2022 10/09/21   Glennon Mac, DO  nystatin ointment (MYCOSTATIN) Apply 1 application topically 2 (two) times daily. Patient not taking: Reported on 04/17/2022 10/31/19   [name], MD  ofloxacin (OCUFLOX) 0.3 % ophthalmic solution Place 1 drop into the right eye 4 (four) times daily. Patient not taking: Reported on 04/17/2022 07/16/21   [provider]  pregabalin (LYRICA) 50 MG capsule Take 1 capsule (50 mg total) by mouth at bedtime as needed (nerve pain). Patient not taking: Reported on 04/17/2022 03/25/21   Gregor Hams, MD  rosuvastatin (CRESTOR) 20 MG tablet Take 1 tablet (20 mg total) by mouth every evening. Patient not taking: Reported on 04/17/2022 05/27/21   [name], MD    Inpatient Medications: Scheduled Meds:  artificial tears   Right Eye Q4H   aspirin EC  81 mg Oral Daily   enoxaparin (LOVENOX) injection  40 mg Subcutaneous Daily   famotidine  20 mg Oral Daily   insulin aspart  0-15 Units Subcutaneous TID WC   insulin aspart  0-5 Units Subcutaneous QHS   insulin aspart  5 Units Subcutaneous TID WC   insulin glargine-yfgn  50 Units Subcutaneous Daily   metoprolol tartrate  2.5 mg Intravenous Q12H   pantoprazole  40 mg Oral Daily   polyethylene glycol  17 g Oral Daily   potassium chloride  20 mEq Oral BID   predniSONE  40 mg Oral Q breakfast   rosuvastatin  20 mg Oral QPM   sodium chloride flush  3 mL Intravenous Q12H   Continuous Infusions:  linezolid (ZYVOX) IV 600 mg (04/18/22 0055)   penicillin G potassium 12 Million Units in dextrose 5 % 500 mL CONTINUOUS infusion 12 Million Units (04/18/22 0214)   PRN Meds: acetaminophen **OR** acetaminophen, [COMPLETED] ipratropium-albuterol **FOLLOWED BY** ipratropium-albuterol, ondansetron **OR** ondansetron (ZOFRAN) IV, pregabalin  Allergies:    Allergies  Allergen Reactions   Empagliflozin Rash     Patient reports yeast.    Ibuprofen Other (See Comments)    Stomach pain    Social History:   Social History   Socioeconomic History   Marital status: Significant Other    Spouse name: Not on file   Number of children: Not on file   Years of education: Not on file   Highest education level: Not on file  Occupational History   Occupation: Disabled     Comment: previously worked in delivery   Tobacco Use   Smoking status: Former    Packs/day: 1.50    Years: 30.00    Total pack years: 45.00    Types: Cigarettes    Quit date: 10/20/2004    Years since quitting: 17.5   Smokeless tobacco: Never  Vaping Use   Vaping Use: Never used  Substance and Sexual Activity   Alcohol use: Yes    Comment: occasional   Drug use: No   Sexual activity: Not on file  Other Topics Concern   Not on file  Social History Narrative   Lives with significant other x 30+ years, apartment "friends smoke". Disability. Is disabled secondary to his work-related injury.       Has over 20 grandchildren    Social Determinants of Health   Financial Resource Strain: Low Risk  (11/28/2021)   Overall Financial Resource Strain (CARDIA)    Difficulty of Paying Living Expenses: Not hard at all  Food Insecurity: No Food Insecurity (04/17/2022)   Hunger Vital Sign    Worried About Running Out of Food in the Last Year: Never true    Ran Out of Food in the Last Year: Never true  Transportation Needs: No Transportation Needs (04/17/2022)   PRAPARE - Hydrologist (Medical): No    Lack of Transportation (Non-Medical): No  Physical Activity: Sufficiently Active (11/28/2021)   Exercise Vital Sign    Days of Exercise per Week: 5 days    Minutes of Exercise per Session: 30 min  Stress: No Stress Concern Present (11/28/2021)   [place]    Feeling of Stress : Not at all  Social Connections: Socially Isolated (11/28/2021)    Social Connection and Isolation Panel [NHANES]    Frequency of Communication with Friends and Family: More than three times a week    Frequency of Social Gatherings with Friends and Family: More than three times a week    Attends Religious Services: Never    Marine scientist or Organizations: No    Attends Archivist Meetings: Never    Marital Status: Never married  Intimate Partner Violence: Not At Risk (04/17/2022)   Humiliation, Afraid, Rape, and Kick questionnaire    Fear of Current or Ex-Partner: No    Emotionally Abused: No    Physically Abused: No    Sexually Abused: No    Family History:    Family History  Problem Relation Age of Onset   Breast cancer Mother    Prostate cancer Father    Coronary artery disease Father        underwent CABG     ROS:  Please see the history of present illness.   All other ROS reviewed and negative.     Physical Exam/Data:   Vitals:   04/18/22 0500 04/18/22 0854 04/18/22 0927 04/18/22 0943  BP: (!) 146/89 108/73 99/62 105/65  Pulse: 95 (!) 102 95   Resp: '20 20 20   '$ Temp: 98.6 F (37 C)  98.3 F (36.8 C)   TempSrc: Oral  Oral   SpO2: (!) 89%  91%   Weight:      Height:        Intake/Output Summary (Last 24 hours) at 04/18/2022 1020 Last data filed at 04/18/2022 0600 Gross per 24 hour  Intake 2187.07 ml  Output 850 ml  Net  1337.07 ml      04/18/2022   12:25 AM 04/17/2022    3:44 PM 04/17/2022   12:16 AM  Last 3 Weights  Weight (lbs) 309 lb 15.5 oz 307 lb 8.7 oz 306 lb 7 oz  Weight (kg) 140.6 kg 139.5 kg 139 kg     Body mass index is 43.23 kg/m.  General:  Well nourished, well developed, in no acute distress HEENT: normal Neck: + JVD Vascular: No carotid bruits; Distal pulses 2+ bilaterally Cardiac:  RRR, 2/6 murmur Lungs:  clear to auscultation bilaterally, no wheezing, rhonchi or rales  Abd: soft, nontender, no hepatomegaly  Ext: B LE edema with chronic skin changes Musculoskeletal:  No deformities, BUE  and BLE strength normal and equal Skin: warm and dry  Neuro:  CNs 2-12 intact, no focal abnormalities noted Psych:  Normal affect   EKG:  The EKG was personally reviewed and demonstrates:  sinus rhythm with HR 96, PACs, RBBB, LAFB Telemetry:  Telemetry was personally reviewed and demonstrates:  sinus to sinus tachycardia with PACs  Relevant CV Studies:  Echo 04/17/22: 1. Left ventricular ejection fraction, by estimation, is 55 to 60%. The  left ventricle has normal function. The left ventricle has no regional  wall motion abnormalities. Left ventricular diastolic parameters were  normal. There is the interventricular  septum is flattened in diastole ('D' shaped left ventricle), consistent  with right ventricular volume overload.   2. Right ventricular systolic function is severely reduced. The right  ventricular size is moderately enlarged. There is normal pulmonary artery  systolic pressure. The estimated right ventricular systolic pressure is  [id number] mmHg, but this may be severely  underestimated in the setting of torrential tricuspid regurgitation.   3. Left atrial size was mildly dilated.   4. Right atrial size was severely dilated.   5. The mitral valve is normal in structure. Mild mitral valve  regurgitation.   6. Tricuspid valve regurgitation is torrential, due to severe leaflet  malcoaptation.   7. The aortic valve is tricuspid. There is mild calcification of the  aortic valve. Aortic valve regurgitation is not visualized. Aortic valve  sclerosis/calcification is present, without any evidence of aortic  stenosis.   Laboratory Data:  High Sensitivity Troponin:   Recent Labs  Lab 04/17/22 0045 04/17/22 0240  TROPONINIHS 11 11     Chemistry Recent Labs  Lab 04/12/22 1354 04/12/22 1406 04/15/22 1155 04/17/22 0045 04/17/22 0830 04/18/22 0050  NA 136  --  137 137  --  133*  K 2.7*  --  3.7 3.8  --  4.5  CL 97*  --  100 100  --  98  CO2 29  --  27 23  --  20*   GLUCOSE 251*  --  220* 217*  --  208*  BUN 17  --  26* 24*  --  34*  CREATININE 1.50*  --  1.58* 1.62* 1.47* 2.95*  CALCIUM 8.6*  --  9.0 9.2  --  8.4*  MG  --  1.5*  --   --   --   --   GFRNONAA 50*  --   --  46* 52* 22*  ANIONGAP 10  --   --  14  --  15    Recent Labs  Lab 04/15/22 1155 04/17/22 0045 04/18/22 0050  PROT 7.8 8.2* 7.9  ALBUMIN 3.4* 3.1* 2.8*  AST 22 30 75*  ALT '12 19 28  '$ ALKPHOS 116 104 99  BILITOT 1.0  0.9 3.2*   Lipids No results for input(s): "CHOL", "TRIG", "HDL", "LABVLDL", "LDLCALC", "CHOLHDL" in the last 168 hours.  Hematology Recent Labs  Lab 04/17/22 0045 04/17/22 0830 04/18/22 0050  WBC 10.6* 13.0* 17.5*  RBC 4.04* 4.22 3.96*  HGB 14.0 14.4 14.0  HCT 41.6 43.9 40.5  MCV 103.0* 104.0* 102.3*  MCH 34.7* 34.1* 35.4*  MCHC 33.7 32.8 34.6  RDW 15.1 15.4 15.7*  PLT 189 185 155   Thyroid No results for input(s): "TSH", "FREET4" in the last 168 hours.  BNP Recent Labs  Lab 04/17/22 0830 04/18/22 0050  BNP 1,006.1* 938.7*    DDimer No results for input(s): "DDIMER" in the last 168 hours.   Radiology/Studies:  ECHOCARDIOGRAM COMPLETE  Result Date: 04/17/2022    ECHOCARDIOGRAM REPORT   Patient Name:   DEIDRICK FORTES Date of Exam: 04/17/2022 Medical Rec #:  [id number]       Height:       71.0 in Accession #:    [id number]      Weight:       306.4 lb Date of Birth:  Oct 19, 1955       BSA:          2.528 m Patient Age:    40 years        BP:           103/69 mmHg Patient Gender: M               HR:           84 bpm. Exam Location:  Inpatient Procedure: 2D Echo, Color Doppler, Cardiac Doppler and Intracardiac            Opacification Agent Indications:    I50.9* Heart failure (unspecified)  History:        Patient has prior history of Echocardiogram examinations, most                 recent 05/07/2017. CAD, Prior CABG; Risk Factors:Hypertension,                 Diabetes, Dyslipidemia and Sleep Apnea.  Sonographer:    Raquel Sarna Senior RDCS Referring Phys: [id number]  ALLISON L ELLIS  Sonographer Comments: Very difficult study due to patient body habitus, scanned upright due to dyspnea. IMPRESSIONS  1. Left ventricular ejection fraction, by estimation, is 55 to 60%. The left ventricle has normal function. The left ventricle has no regional wall motion abnormalities. Left ventricular diastolic parameters were normal. There is the interventricular septum is flattened in diastole ('D' shaped left ventricle), consistent with right ventricular volume overload.  2. Right ventricular systolic function is severely reduced. The right ventricular size is moderately enlarged. There is normal pulmonary artery systolic pressure. The estimated right ventricular systolic pressure is [id number] mmHg, but this may be severely underestimated in the setting of torrential tricuspid regurgitation.  3. Left atrial size was mildly dilated.  4. Right atrial size was severely dilated.  5. The mitral valve is normal in structure. Mild mitral valve regurgitation.  6. 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  7. The aortic valve is tricuspid. There is mild calcification of the aortic valve. Aortic valve regurgitation is not visualized. Aortic valve sclerosis/calcification is present, without any evidence of aortic stenosis. FINDINGS  Left Ventricle: Left ventricular ejection fraction, by estimation, is 55 to 60%. The left ventricle has normal function. The left ventricle has no regional wall motion abnormalities. Definity contrast agent was given IV to  delineate the left ventricular  endocardial borders. The left ventricular internal cavity size was normal in size. There is no left ventricular hypertrophy. The interventricular septum is flattened in diastole ('D' shaped left ventricle), consistent with right ventricular volume overload. Left ventricular diastolic parameters were normal. Normal left ventricular filling pressure. Right Ventricle: The right ventricular size  is moderately enlarged. Right vetricular wall thickness was not well visualized. Right ventricular systolic function is severely reduced. There is normal pulmonary artery systolic pressure. The tricuspid regurgitant velocity is 1.67 m/s, and with an assumed right atrial pressure of 15 mmHg, the estimated right ventricular systolic pressure is [id number] mmHg. Left Atrium: Left atrial size was mildly dilated. Right Atrium: Right atrial size was severely dilated. Pericardium: There is no evidence of pericardial effusion. Mitral Valve: The mitral valve is normal in structure. Mild mitral valve regurgitation, with eccentric posteriorly directed jet. Tricuspid Valve: There appears to be severe leaflet malcoaptation. The tricuspid valve is not well visualized. Tricuspid valve regurgitation is severe. Aortic Valve: The aortic valve is tricuspid. There is mild calcification of the aortic valve. Aortic valve regurgitation is not visualized. Aortic valve sclerosis/calcification is present, without any evidence of aortic stenosis. Pulmonic Valve: The pulmonic valve was normal in structure. Pulmonic valve regurgitation is trivial. Aorta: The aortic root and ascending aorta are structurally normal, with no evidence of dilitation. Venous: The inferior vena cava and the hepatic vein show a pattern of systolic flow reversal, suggestive of tricuspid regurgitation. IAS/Shunts: No atrial level shunt detected by color flow Doppler.  LEFT VENTRICLE PLAX 2D LVIDd:         4.40 cm   Diastology LVIDs:         3.10 cm   LV e' medial:    6.64 cm/s LV PW:         0.90 cm   LV E/e' medial:  8.0 LV IVS:        0.90 cm   LV e' lateral:   11.53 cm/s LVOT diam:     2.00 cm   LV E/e' lateral: 4.6 LV SV:         31 LV SV Index:   12 LVOT Area:     3.14 cm  RIGHT VENTRICLE RV S prime:     4.90 cm/s TAPSE (M-mode): 1.1 cm LEFT ATRIUM             Index        RIGHT ATRIUM           Index LA diam:        4.40 cm 1.74 cm/m   RA Area:     38.40 cm LA Vol  (A2C):   44.8 ml 17.72 ml/m  RA Volume:   171.00 ml 67.65 ml/m LA Vol (A4C):   52.5 ml 20.77 ml/m LA Biplane Vol: 52.8 ml 20.89 ml/m  AORTIC VALVE LVOT Vmax:   64.63 cm/s LVOT Vmean:  47.333 cm/s LVOT VTI:    0.097 m  AORTA Ao Root diam: 2.80 cm Ao Asc diam:  3.50 cm MV E velocity: 53.05 cm/s  TRICUSPID VALVE MV A velocity: 37.26 cm/s  TR Peak grad:   11.2 mmHg MV E/A ratio:  1.42        TR Vmax:        167.00 cm/s                             SHUNTS  Systemic VTI:  0.10 m                            Systemic Diam: 2.00 cm Sanda Klein MD Electronically signed by Sanda Klein MD Signature Date/Time: 04/17/2022/4:11:22 PM    Final    CT L-SPINE NO CHARGE  Result Date: 04/17/2022 CLINICAL DATA:  Low back pain, increased fracture risk.  Back pain. EXAM: CT LUMBAR SPINE WITHOUT CONTRAST TECHNIQUE: Multidetector CT imaging of the lumbar spine was performed without intravenous contrast administration. Multiplanar CT image reconstructions were also generated. RADIATION DOSE REDUCTION: This exam was performed according to the departmental dose-optimization program which includes automated exposure control, adjustment of the mA and/or kV according to patient size and/or use of iterative reconstruction technique. COMPARISON:  CT abdomen and pelvis performed today FINDINGS: Segmentation: 5 lumbar type vertebrae. Alignment: Normal. Vertebrae: No acute fracture or focal pathologic process. Paraspinal and other soft tissues: Negative. Disc levels: Disc spaces maintained.  No disc herniation. IMPRESSION: No acute bony abnormality. Electronically Signed   By: Rolm Baptise M.D.   On: 04/17/2022 02:15   CT ABDOMEN PELVIS WO CONTRAST  Result Date: 04/17/2022 CLINICAL DATA:  Abdominal/flank pain, stone suspected.  Back pain. EXAM: CT ABDOMEN AND PELVIS WITHOUT CONTRAST TECHNIQUE: Multidetector CT imaging of the abdomen and pelvis was performed following the standard protocol without IV contrast.  RADIATION DOSE REDUCTION: This exam was performed according to the departmental dose-optimization program which includes automated exposure control, adjustment of the mA and/or kV according to patient size and/or use of iterative reconstruction technique. COMPARISON:  None Available. FINDINGS: Lower chest: No acute abnormality Hepatobiliary: No focal hepatic abnormality. Gallbladder unremarkable. Pancreas: No focal abnormality or ductal dilatation. Spleen: No focal abnormality.  Normal size. Adrenals/Urinary Tract: No adrenal abnormality. No focal renal abnormality. No stones or hydronephrosis. Urinary bladder is unremarkable. Stomach/Bowel: Normal appendix. Stomach, large and small bowel grossly unremarkable. Vascular/Lymphatic: Aortic atherosclerosis. No aneurysm. Shotty retroperitoneal lymph nodes measuring up to 8 mm in short axis diameter. No pathologically enlarged lymph nodes. Reproductive: No visible focal abnormality. Other: No free fluid or free air. Musculoskeletal: No acute bony abnormality. IMPRESSION: No acute findings in the abdomen or pelvis. Aortic atherosclerosis. Electronically Signed   By: Rolm Baptise M.D.   On: 04/17/2022 02:14   DG Chest Port 1 View  Result Date: 04/17/2022 CLINICAL DATA:  Chest pain EXAM: PORTABLE CHEST 1 VIEW COMPARISON:  08/08/2021 FINDINGS: Cardiac shadow is enlarged but stable. Postsurgical changes are again noted. Lungs are well aerated bilaterally. Central vascular congestion without significant edema is noted. No bony abnormality is noted. IMPRESSION: Changes of mild CHF. Electronically Signed   By: Inez Catalina M.D.   On: 04/17/2022 00:40     Assessment and Plan:   Right heart failure, severe TR Acute on chronic diastolic heart failure  Acute on CKD stage III Hypertension Acute hypoxic respiratory failure Hx of hypokalemia Echo with preserved EF but evidence of right heart failure VQ scan pending He was initially diuresed with 20 mg followed by 60 mg  IV Lasix yesterday.  Unfortunately his renal function declined and is now sCr 2.95, K 4.5 - BNP 1000, CXR with congestion but no edema - hold further diuresis - hold eplerenone, potassium supplement  Will await VQ scan - possibility of PE with recent COVID infection; however, severe TR noted on echo.   CAD s/p CABG x 4 2005 Aortic atherosclerosis Hyperlipidemia with LDL goal < 70  Nonischemic nuclear stress test 2015  PTA 81 mg aspirin and 40 mg simvastatin, 50 mg Lopressor twice daily CE negative, no angina   Sepsis Group A strep bacteremia ABX per primary   Risk Assessment/Risk Scores:       New York Heart Association (NYHA) Functional Class NYHA Class IV        For questions or updates, please contact [place] Please consult [email or web address] for contact info under    Signed, Ledora Bottcher, PA  04/18/2022 10:20 AM

## 2022-04-18 NOTE — TOC Progression Note (Signed)
Transition of Care [facility]) - Progression Note    Patient Details  Name: [name] [name] MRN: [id number] Date of Birth: 1955-10-18  [facility] CM/SW Contact  Zenon Mayo, RN Phone Number: 04/18/2022, 4:21 PM  Clinical Narrative:    from home with wife, resp failure, 3 liters, conts on iv lasix.  TOC following.        Expected Discharge Plan and Services                                               Social Determinants of Health (SDOH) Interventions SDOH Screenings   Food Insecurity: No Food Insecurity (04/17/2022)  Housing: Low Risk  (04/17/2022)  Transportation Needs: No Transportation Needs (04/17/2022)  Utilities: Not At Risk (04/17/2022)  Depression (PHQ2-9): Low Risk  (04/15/2022)  Financial Resource Strain: Low Risk  (11/28/2021)  Physical Activity: Sufficiently Active (11/28/2021)  Social Connections: Socially Isolated (11/28/2021)  Stress: No Stress Concern Present (11/28/2021)  Tobacco Use: Medium Risk (04/15/2022)    Readmission Risk Interventions     No data to display

## 2022-04-18 NOTE — Consult Note (Signed)
[place] for Infectious Disease    Date of Admission:  04/16/2022   Total days of inpatient antibiotics 2        Reason for Consult: Strep bacteremia    Principal Problem:   Acute respiratory failure with hypoxia (HCC) Active Problems:   Bacteremia   Assessment: 52 YM admitted with respiratory failure secondary to CHF exacerbation, strep pyogenes bacteremia: Presented with back pain and fever.  #Streptococcus pyogenes bacteremia secondary to unclear source #Bilateral lower extremity wounds #TV regurg - CT chest showed no acute intrathoracic process.  Stable cardiomegaly. - CT L-spine showed no acute bony abnormality -CT abdomen pelvis no acute findings in abdomen pelvis -TTE showed TV regurg was torrential, due to severe leaflet malcoaptation - Source of bacteremia is unclear, he does have lower extremity wounds but none appear actively infected.  No concern for consolidation CT chest.  I think a TEE is warranted as source of bacteremia is unclear.  He did have a recent ED visit on 3/2 with PIV started but I do not see signs of phlebitis.   #Diabetes mellitus-uncontrolled A1c 9.7 on 3/7.  #CHF exacerbation - Cardiology engaged   Recommendations: -TEE -Repeat blood Cx to ensure clearance -Follow blood cx -D/C linezolid -Continue penicillin - Nuclear medicine perf pending, looking for possible PE - Back pain continues to worsen would recommend getting an MRI lumbar spine    Microbiology:   Antibiotics: Linezolid , PEN 3/7- Azithromycin 3/7 CTX 3/7 Cultures: Blood 3/7 2/2 Strep pyogenes     HPI: [name] [name] is a 67 y.o. male with history of CAD status post CABG in [id number], diastolic dysfunction, cirrhosis of the liver, hypertension adopted atrial rhythm, dyslipidemia, CKD stage III, diabetes mellitus, obesity, peripheral neuropathy presented with complaint of lower back..  On arrival to the ED he had a temp 100.7.  He noted that he recently  recovered from [place], tested +3 weeks ago, recent diagnosis of Bell's palsy.  Admitted for acute hypoxic respiratory secondary to CHF presents with patient.  TTE showed tricuspid regurg.  Blood cultures returned positive for strep pyogenous.  Infectious disease engaged.   Review of Systems: Review of Systems  All other systems reviewed and are negative.   Past Medical History:  Diagnosis Date   Acute myocardial infarction, unspecified site, episode of care unspecified 2005   Acute pancreatitis    CAD (coronary artery disease)    a. CABG in 2005 w LIMA to LAD, left radial to second circumflex marginal, saphenous vein graft to PDA, saphenous vein graft to lateral subbrach of ramus intermediate, and sequential saphenous vein graft to the medial subbranch of ramus intermediate.   Chronic diastolic CHF (congestive heart failure) (HCC)    Cirrhosis of liver without mention of alcohol    CKD (chronic kidney disease), stage II    Complications affecting other specified body systems, hypertension    Ectopic atrial rhythm    Essential hypertension    Hyperlipidemia    Hypokalemia    a. intermitttent noncompliance with potassium supplement.   Hypomagnesemia    Morbid obesity (HCC)    Neuropathy    Other and unspecified hyperlipidemia    Proteinuria    RBBB    Sleep apnea    Type II or unspecified type diabetes mellitus without mention of complication, not stated as uncontrolled    Varicose veins of both lower extremities     Social History   Tobacco Use   Smoking  status: Former    Packs/day: 1.50    Years: 30.00    Total pack years: 45.00    Types: Cigarettes    Quit date: 10/20/2004    Years since quitting: 17.5   Smokeless tobacco: Never  Vaping Use   Vaping Use: Never used  Substance Use Topics   Alcohol use: Yes    Comment: occasional   Drug use: No    Family History  Problem Relation Age of Onset   Breast cancer Mother    Prostate cancer Father    Coronary artery  disease Father        underwent CABG   Scheduled Meds:  artificial tears   Right Eye Q4H   aspirin EC  81 mg Oral Daily   enoxaparin (LOVENOX) injection  40 mg Subcutaneous Daily   famotidine  20 mg Oral Daily   insulin aspart  0-15 Units Subcutaneous TID WC   insulin aspart  0-5 Units Subcutaneous QHS   insulin aspart  5 Units Subcutaneous TID WC   insulin glargine-yfgn  50 Units Subcutaneous Daily   metoprolol tartrate  12.5 mg Oral BID   pantoprazole  40 mg Oral Daily   polyethylene glycol  17 g Oral Daily   predniSONE  40 mg Oral Q breakfast   rosuvastatin  20 mg Oral QPM   sodium chloride flush  3 mL Intravenous Q12H   Continuous Infusions:  penicillin G potassium 12 Million Units in dextrose 5 % 500 mL CONTINUOUS infusion 12 Million Units (04/18/22 1144)   PRN Meds:.acetaminophen **OR** acetaminophen, [COMPLETED] ipratropium-albuterol **FOLLOWED BY** ipratropium-albuterol, ondansetron **OR** ondansetron (ZOFRAN) IV, pregabalin Allergies  Allergen Reactions   Empagliflozin Rash    Patient reports yeast.    Ibuprofen Other (See Comments)    Stomach pain    OBJECTIVE: Blood pressure 115/65, pulse 93, temperature 98.3 F (36.8 C), temperature source Oral, resp. rate 18, height '5\' 11"'$  (1.803 m), weight (!) 140.6 kg, SpO2 91 %.  Physical Exam Constitutional:      General: He is not in acute distress.    Appearance: He is normal weight. He is not toxic-appearing.  HENT:     Head: Normocephalic and atraumatic.     Right Ear: External ear normal.     Left Ear: External ear normal.     Nose: No congestion or rhinorrhea.     Mouth/Throat:     Mouth: Mucous membranes are moist.     Pharynx: Oropharynx is clear.  Eyes:     Extraocular Movements: Extraocular movements intact.     Conjunctiva/sclera: Conjunctivae normal.     Pupils: Pupils are equal, round, and reactive to light.  Cardiovascular:     Rate and Rhythm: Normal rate and regular rhythm.     Heart sounds: No  murmur heard.    No friction rub. No gallop.  Pulmonary:     Effort: Pulmonary effort is normal.     Breath sounds: Normal breath sounds.  Abdominal:     General: Abdomen is flat. Bowel sounds are normal.     Palpations: Abdomen is soft.  Musculoskeletal:        General: No swelling. Normal range of motion.     Cervical back: Normal range of motion and neck supple.  Skin:    General: Skin is warm and dry.     Comments: LE wounds  Neurological:     General: No focal deficit present.     Mental Status: He is oriented to person, place,  and time.  Psychiatric:        Mood and Affect: Mood normal.     Lab Results Lab Results  Component Value Date   WBC 17.5 (H) 04/18/2022   HGB 14.0 04/18/2022   HCT 40.5 04/18/2022   MCV 102.3 (H) 04/18/2022   PLT 155 04/18/2022    Lab Results  Component Value Date   CREATININE 2.95 (H) 04/18/2022   BUN 34 (H) 04/18/2022   NA 133 (L) 04/18/2022   K 4.5 04/18/2022   CL 98 04/18/2022   CO2 20 (L) 04/18/2022    Lab Results  Component Value Date   ALT 28 04/18/2022   AST 75 (H) 04/18/2022   ALKPHOS 99 04/18/2022   BILITOT 3.2 (H) 04/18/2022       Laurice Record, [place] 04/18/2022, 2:17 PM

## 2022-04-18 NOTE — Progress Notes (Signed)
PROGRESS NOTE    [name] [name]  [id number] DOB: 1956/02/09 DOA: 04/16/2022 PCP: [name], MD  Chief Complaint  Patient presents with   Back Pain    Brief Narrative:   67 y.o. male with medical history significant of coronary artery disease status post CABG in [id number], diastolic dysfunction, cirrhosis of the liver, hypertension, ectopic atrial rhythm, dyslipidemia, CKD 3A, diabetes mellitus 2, sleep apnea and associated right bundle branch block, obesity, peripheral neuropathy in context of diabetes as well as varicose veins admitted initially with concern for heart failure, now found to have group Asahd Can strep bacteremia.  Assessment & Plan:   Principal Problem:   Acute respiratory failure with hypoxia (HCC)  Sepsis Group [name] [name] Strep Bacteremia CT abd/pelvis without acute findings.  CT L spine without bony abnormality. CXR with mild CHF Will follow CT chest 2/2 blood cultures with strep bacteremia Will repeat cultures  Continue penicillin and linezolid   Acute Hypoxic Respiratory Failure Acute on Chronic Diastolic Heart Failure CXR at presentation with mild CHF Following CT chest as noted above Hold further diuresis with worsening creatinine after diuresis 3/7 (also with suspicion of sepsis, infection above) Strict I/O, daily weights  Right Sided Heart Failure Echo with IV septum flattened in diastole c/w RV volume overload, RVSF severely reduced, RV size moderately enlarged, torrential tricuspid regurgitation Appreciate cardiology recs Will follow LE [place].  Consider VQ scan based on CT chest results.   Acute Kidney Injury In setting of sepsis and diuresis Hold further diuretics at this time.  Hold prior to admission losartan and eplerenone.  Hypertension Hold losartan and eplerenone  Low dose metop with holding parameters with sepsis/soft BP's recently  CAD  hx CABG Reduced dose beta blocker with sepsis Statin  Bell's Palsy Right sided facial droop without  forehead sparing MRI brain 3/2 without acut intracranial pathology Continue steroids, ophthalmic ointment  T2DM Continue home insulin, add mealtime and SSI Follow A1c  OSA Cpap  Recent COVID 19 infection    DVT prophylaxis: lovenox Code Status: full Family Communication: none Disposition:   Status is: Observation The patient will require care spanning > 2 midnights and should be moved to inpatient because: continued ID and cardiology w/u   Consultants:  ID cardiology  Procedures:    Antimicrobials:  Anti-infectives (From admission, onward)    Start     Dose/Rate Route Frequency Ordered Stop   04/18/22 1000  cefTRIAXone (ROCEPHIN) 2 g in sodium chloride 0.9 % 100 mL IVPB  Status:  Discontinued        2 g 200 mL/hr over 30 Minutes Intravenous Every 24 hours 04/17/22 2153 04/17/22 2217   04/17/22 2315  penicillin G potassium 12 Million Units in dextrose 5 % 500 mL CONTINUOUS infusion        12 Million Units 41.7 mL/hr over 12 Hours Intravenous Every 12 hours 04/17/22 2217     04/17/22 2315  linezolid (ZYVOX) IVPB 600 mg        600 mg 300 mL/hr over 60 Minutes Intravenous Every 12 hours 04/17/22 2217     04/17/22 1000  cefTRIAXone (ROCEPHIN) 1 g in sodium chloride 0.9 % 100 mL IVPB  Status:  Discontinued        1 g 200 mL/hr over 30 Minutes Intravenous Every 24 hours 04/17/22 0919 04/17/22 2153   04/17/22 1000  azithromycin (ZITHROMAX) 500 mg in sodium chloride 0.9 % 250 mL IVPB  Status:  Discontinued        500 mg  250 mL/hr over 60 Minutes Intravenous Every 24 hours 04/17/22 0919 04/17/22 2217       Subjective: No new complaints today Back pain he had at admission is resolved  Objective: Vitals:   04/18/22 0500 04/18/22 0854 04/18/22 0927 04/18/22 0943  BP: (!) 146/89 108/73 99/62 105/65  Pulse: 95 (!) 102 95   Resp: '20 20 20   '$ Temp: 98.6 F (37 C)  98.3 F (36.8 C)   TempSrc: Oral  Oral   SpO2: (!) 89%  91%   Weight:      Height:         Intake/Output Summary (Last 24 hours) at 04/18/2022 1007 Last data filed at 04/18/2022 0600 Gross per 24 hour  Intake 2187.07 ml  Output 850 ml  Net 1337.07 ml   Filed Weights   04/17/22 0016 04/17/22 1544 04/18/22 0025  Weight: (!) 139 kg (!) 139.5 kg (!) 140.6 kg    Examination:  General exam: Appears calm and comfortable  Respiratory system: unlabored Cardiovascular system: RRR Gastrointestinal system: Abdomen is nondistended, soft and nontender.  Central nervous system: Alert and oriented. No focal neurological deficits. Extremities: no LEE   Data Reviewed: I have personally reviewed following labs and imaging studies  CBC: Recent Labs  Lab 04/12/22 1354 04/15/22 1155 04/17/22 0045 04/17/22 0830 04/18/22 0050  WBC 6.7 6.7 10.6* 13.0* 17.5*  NEUTROABS 2.8  --   --   --   --   HGB 14.6 14.1 14.0 14.4 14.0  HCT 41.9 41.1 41.6 43.9 40.5  MCV 99.5 103.2* 103.0* 104.0* 102.3*  PLT 198 212.0 189 185 [id number]    Basic Metabolic Panel: Recent Labs  Lab 04/12/22 1341 04/12/22 1354 04/12/22 1406 04/15/22 1155 04/17/22 0045 04/17/22 0830 04/18/22 0050  NA 140 136  --  137 137  --  133*  K 2.6* 2.7*  --  3.7 3.8  --  4.5  CL 95* 97*  --  100 100  --  98  CO2  --  29  --  27 23  --  20*  GLUCOSE 251* 251*  --  220* 217*  --  208*  BUN 19 17  --  26* 24*  --  34*  CREATININE 1.40* 1.50*  --  1.58* 1.62* 1.47* 2.95*  CALCIUM  --  8.6*  --  9.0 9.2  --  8.4*  MG  --   --  1.5*  --   --   --   --     GFR: Estimated Creatinine Clearance: 34.4 mL/min ([name]) (by C-G formula based on SCr of 2.95 mg/dL (H)).  Liver Function Tests: Recent Labs  Lab 04/12/22 1354 04/15/22 1155 04/17/22 0045 04/18/22 0050  AST '22 22 30 '$ 75*  ALT '12 12 19 28  '$ ALKPHOS 105 116 104 99  BILITOT 1.0 1.0 0.9 3.2*  PROT 8.6* 7.8 8.2* 7.9  ALBUMIN 3.2* 3.4* 3.1* 2.8*    CBG: Recent Labs  Lab 04/17/22 0837 04/17/22 1107 04/17/22 1558 04/17/22 2117 04/18/22 0615  GLUCAP 181* 206* 169*  155* 261*     Recent Results (from the past 240 hour(s))  Resp panel by RT-PCR (RSV, Flu Kayton Dunaj&B, Covid) Urine, Clean Catch     Status: None   Collection Time: 04/17/22  6:14 AM   Specimen: Urine, Clean Catch; Nasal Swab  Result Value Ref Range Status   SARS Coronavirus 2 by RT PCR NEGATIVE NEGATIVE Final   Influenza Evelina Lore by PCR NEGATIVE NEGATIVE Final  Influenza B by PCR NEGATIVE NEGATIVE Final    Comment: (NOTE) The Xpert Xpress SARS-CoV-2/FLU/RSV plus assay is intended as an aid in the diagnosis of influenza from Nasopharyngeal swab specimens and should not be used as Zaiah Eckerson sole basis for treatment. Nasal washings and aspirates are unacceptable for Xpert Xpress SARS-CoV-2/FLU/RSV testing.  Fact Sheet for Patients: EntrepreneurPulse.com.au  Fact Sheet for Healthcare Providers: IncredibleEmployment.be  This test is not yet approved or cleared by the [place] FDA and has been authorized for detection and/or diagnosis of SARS-CoV-2 by FDA under an Emergency Use Authorization (EUA). This EUA will remain in effect (meaning this test can be used) for the duration of the COVID-19 declaration under Section 564(b)(1) of the Act, 21 U.S.C. section 360bbb-3(b)(1), unless the authorization is terminated or revoked.     Resp Syncytial Virus by PCR NEGATIVE NEGATIVE Final    Comment: (NOTE) Fact Sheet for Patients: EntrepreneurPulse.com.au  Fact Sheet for Healthcare Providers: IncredibleEmployment.be  This test is not yet approved or cleared by the [place] FDA and has been authorized for detection and/or diagnosis of SARS-CoV-2 by FDA under an Emergency Use Authorization (EUA). This EUA will remain in effect (meaning this test can be used) for the duration of the COVID-19 declaration under Section 564(b)(1) of the Act, 21 U.S.C. section 360bbb-3(b)(1), unless the authorization is terminated  or revoked.  Performed at [facility], [place] [address], [place], [place] [address]   Respiratory (~20 pathogens) panel by PCR     Status: None   Collection Time: 04/17/22  7:19 AM   Specimen: Nasopharyngeal Swab; Respiratory  Result Value Ref Range Status   Adenovirus NOT DETECTED NOT DETECTED Final   Coronavirus 229E NOT DETECTED NOT DETECTED Final    Comment: (NOTE) The Coronavirus on the Respiratory Panel, DOES NOT test for the novel  Coronavirus (2019 nCoV)    Coronavirus HKU1 NOT DETECTED NOT DETECTED Final   Coronavirus NL63 NOT DETECTED NOT DETECTED Final   Coronavirus OC43 NOT DETECTED NOT DETECTED Final   Metapneumovirus NOT DETECTED NOT DETECTED Final   Rhinovirus / Enterovirus NOT DETECTED NOT DETECTED Final   Influenza Rokhaya Quinn NOT DETECTED NOT DETECTED Final   Influenza B NOT DETECTED NOT DETECTED Final   Parainfluenza Virus 1 NOT DETECTED NOT DETECTED Final   Parainfluenza Virus 2 NOT DETECTED NOT DETECTED Final   Parainfluenza Virus 3 NOT DETECTED NOT DETECTED Final   Parainfluenza Virus 4 NOT DETECTED NOT DETECTED Final   Respiratory Syncytial Virus NOT DETECTED NOT DETECTED Final   Bordetella pertussis NOT DETECTED NOT DETECTED Final   Bordetella Parapertussis NOT DETECTED NOT DETECTED Final   Chlamydophila pneumoniae NOT DETECTED NOT DETECTED Final   Mycoplasma pneumoniae NOT DETECTED NOT DETECTED Final    Comment: Performed at [facility], [place]. [address], [place], [place] [address]  Culture, blood (Routine X 2) w Reflex to ID Panel     Status: Abnormal (Preliminary result)   Collection Time: 04/17/22  8:23 AM   Specimen: BLOOD  Result Value Ref Range Status   Specimen Description BLOOD RIGHT ANTECUBITAL  Final   Special Requests   Final    BOTTLES DRAWN AEROBIC AND ANAEROBIC Blood Culture results may not be optimal due to an inadequate volume of blood received in culture bottles   Culture  Setup Time   Final    GRAM POSITIVE COCCI IN CHAINS IN BOTH  AEROBIC AND ANAEROBIC BOTTLES CRITICAL RESULT CALLED TO, READ BACK BY AND VERIFIED WITH: PHARMD  LISA CURRAN ON 04/17/22 @ 2151 BY DRT    Culture (Asante Ritacco)  Final    STREPTOCOCCUS PYOGENES SUSCEPTIBILITIES TO FOLLOW Performed at [facility], [place] [address] [place], [place] [address]    Report Status PENDING  Incomplete  Blood Culture ID Panel (Reflexed)     Status: Abnormal   Collection Time: 04/17/22  8:23 AM  Result Value Ref Range Status   Enterococcus faecalis NOT DETECTED NOT DETECTED Final   Enterococcus Faecium NOT DETECTED NOT DETECTED Final   Listeria monocytogenes NOT DETECTED NOT DETECTED Final   Staphylococcus species NOT DETECTED NOT DETECTED Final   Staphylococcus aureus (BCID) NOT DETECTED NOT DETECTED Final   Staphylococcus epidermidis NOT DETECTED NOT DETECTED Final   Staphylococcus lugdunensis NOT DETECTED NOT DETECTED Final   Streptococcus species DETECTED (Waylon Hershey) NOT DETECTED Final    Comment: CRITICAL RESULT CALLED TO, READ BACK BY AND VERIFIED WITH: PHARMD LISA CURRAN ON 04/17/22 @ 2151 BY DRT    Streptococcus agalactiae NOT DETECTED NOT DETECTED Final   Streptococcus pneumoniae NOT DETECTED NOT DETECTED Final   Streptococcus pyogenes DETECTED (Bettie Capistran) NOT DETECTED Final    Comment: CRITICAL RESULT CALLED TO, READ BACK BY AND VERIFIED WITH: PHARMD LISA CURRAN ON 04/17/22 @ 2151 BY DRT    Nieko Clarin.calcoaceticus-baumannii NOT DETECTED NOT DETECTED Final   Bacteroides fragilis NOT DETECTED NOT DETECTED Final   Enterobacterales NOT DETECTED NOT DETECTED Final   Enterobacter cloacae complex NOT DETECTED NOT DETECTED Final   Escherichia coli NOT DETECTED NOT DETECTED Final   Klebsiella aerogenes NOT DETECTED NOT DETECTED Final   Klebsiella oxytoca NOT DETECTED NOT DETECTED Final   Klebsiella pneumoniae NOT DETECTED NOT DETECTED Final   Proteus species NOT DETECTED NOT DETECTED Final   Salmonella species NOT DETECTED NOT DETECTED Final   Serratia marcescens NOT DETECTED NOT DETECTED  Final   Haemophilus influenzae NOT DETECTED NOT DETECTED Final   Neisseria meningitidis NOT DETECTED NOT DETECTED Final   Pseudomonas aeruginosa NOT DETECTED NOT DETECTED Final   Stenotrophomonas maltophilia NOT DETECTED NOT DETECTED Final   Candida albicans NOT DETECTED NOT DETECTED Final   Candida auris NOT DETECTED NOT DETECTED Final   Candida glabrata NOT DETECTED NOT DETECTED Final   Candida krusei NOT DETECTED NOT DETECTED Final   Candida parapsilosis NOT DETECTED NOT DETECTED Final   Candida tropicalis NOT DETECTED NOT DETECTED Final   Cryptococcus neoformans/gattii NOT DETECTED NOT DETECTED Final    Comment: Performed at [facility], [place] [address] [place], [place] [address]  Culture, blood (Routine X 2) w Reflex to ID Panel     Status: Abnormal (Preliminary result)   Collection Time: 04/17/22  8:30 AM   Specimen: BLOOD LEFT FOREARM  Result Value Ref Range Status   Specimen Description BLOOD LEFT FOREARM  Final   Special Requests   Final    BOTTLES DRAWN AEROBIC AND ANAEROBIC Blood Culture adequate volume   Culture  Setup Time   Final    GRAM POSITIVE COCCI IN CHAINS IN BOTH AEROBIC AND ANAEROBIC BOTTLES CRITICAL VALUE NOTED.  VALUE IS CONSISTENT WITH PREVIOUSLY REPORTED AND CALLED VALUE. Performed at [facility], [place] [address], [place], [place] [address]    Culture STREPTOCOCCUS PYOGENES (Zeppelin Commisso)  Final   Report Status PENDING  Incomplete         Radiology Studies: ECHOCARDIOGRAM COMPLETE  Result Date: 04/17/2022    ECHOCARDIOGRAM REPORT   Patient Name:   OWIN HURNEY Date of Exam: 04/17/2022 Medical  Rec #:  [id number]       Height:       71.0 in Accession #:    [id number]      Weight:       306.4 lb Date of Birth:  04/16/1955       BSA:          2.528 m Patient Age:    62 years        BP:           103/69 mmHg Patient Gender: M               HR:           84 bpm. Exam Location:  Inpatient Procedure: 2D Echo, Color Doppler, Cardiac Doppler and Intracardiac             Opacification Agent Indications:    I50.9* Heart failure (unspecified)  History:        Patient has prior history of Echocardiogram examinations, most                 recent 05/07/2017. CAD, Prior CABG; Risk Factors:Hypertension,                 Diabetes, Dyslipidemia and Sleep Apnea.  Sonographer:    Raquel Sarna Senior RDCS Referring Phys: [id number] ALLISON L ELLIS  Sonographer Comments: Very difficult study due to patient body habitus, scanned upright due to dyspnea. IMPRESSIONS  1. Left ventricular ejection fraction, by estimation, is 55 to 60%. The left ventricle has normal function. The left ventricle has no regional wall motion abnormalities. Left ventricular diastolic parameters were normal. There is the interventricular septum is flattened in diastole ('D' shaped left ventricle), consistent with right ventricular volume overload.  2. Right ventricular systolic function is severely reduced. The right ventricular size is moderately enlarged. There is normal pulmonary artery systolic pressure. The estimated right ventricular systolic pressure is [id number] mmHg, but this may be severely underestimated in the setting of torrential tricuspid regurgitation.  3. Left atrial size was mildly dilated.  4. Right atrial size was severely dilated.  5. The mitral valve is normal in structure. Mild mitral valve regurgitation.  6. 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  7. The aortic valve is tricuspid. There is mild calcification of the aortic valve. Aortic valve regurgitation is not visualized. Aortic valve sclerosis/calcification is present, without any evidence of aortic stenosis. FINDINGS  Left Ventricle: Left ventricular ejection fraction, by estimation, is 55 to 60%. The left ventricle has normal function. The left ventricle has no regional wall motion abnormalities. Definity contrast agent was given IV to delineate the left ventricular  endocardial borders. The left ventricular  internal cavity size was normal in size. There is no left ventricular hypertrophy. The interventricular septum is flattened in diastole ('D' shaped left ventricle), consistent with right ventricular volume overload. Left ventricular diastolic parameters were normal. Normal left ventricular filling pressure. Right Ventricle: The right ventricular size is moderately enlarged. Right vetricular wall thickness was not well visualized. Right ventricular systolic function is severely reduced. There is normal pulmonary artery systolic pressure. The tricuspid regurgitant velocity is 1.67 m/s, and with an assumed right atrial pressure of 15 mmHg, the estimated right ventricular systolic pressure is [id number] mmHg. Left Atrium: Left atrial size was mildly dilated. Right Atrium: Right atrial size was severely dilated. Pericardium: There is no evidence of pericardial effusion. Mitral Valve: The mitral valve is normal in structure. Mild mitral valve regurgitation, with eccentric  posteriorly directed jet. Tricuspid Valve: There appears to be severe leaflet malcoaptation. The tricuspid valve is not well visualized. Tricuspid valve regurgitation is severe. Aortic Valve: The aortic valve is tricuspid. There is mild calcification of the aortic valve. Aortic valve regurgitation is not visualized. Aortic valve sclerosis/calcification is present, without any evidence of aortic stenosis. Pulmonic Valve: The pulmonic valve was normal in structure. Pulmonic valve regurgitation is trivial. Aorta: The aortic root and ascending aorta are structurally normal, with no evidence of dilitation. Venous: The inferior vena cava and the hepatic vein show Liyat Faulkenberry pattern of systolic flow reversal, suggestive of tricuspid regurgitation. IAS/Shunts: No atrial level shunt detected by color flow Doppler.  LEFT VENTRICLE PLAX 2D LVIDd:         4.40 cm   Diastology LVIDs:         3.10 cm   LV e' medial:    6.64 cm/s LV PW:         0.90 cm   LV E/e' medial:  8.0 LV IVS:         0.90 cm   LV e' lateral:   11.53 cm/s LVOT diam:     2.00 cm   LV E/e' lateral: 4.6 LV SV:         31 LV SV Index:   12 LVOT Area:     3.14 cm  RIGHT VENTRICLE RV S prime:     4.90 cm/s TAPSE (M-mode): 1.1 cm LEFT ATRIUM             Index        RIGHT ATRIUM           Index LA diam:        4.40 cm 1.74 cm/m   RA Area:     38.40 cm LA Vol (A2C):   44.8 ml 17.72 ml/m  RA Volume:   171.00 ml 67.65 ml/m LA Vol (A4C):   52.5 ml 20.77 ml/m LA Biplane Vol: 52.8 ml 20.89 ml/m  AORTIC VALVE LVOT Vmax:   64.63 cm/s LVOT Vmean:  47.333 cm/s LVOT VTI:    0.097 m  AORTA Ao Root diam: 2.80 cm Ao Asc diam:  3.50 cm MV E velocity: 53.05 cm/s  TRICUSPID VALVE MV Anubis Fundora velocity: 37.26 cm/s  TR Peak grad:   11.2 mmHg MV E/Stephenia Vogan ratio:  1.42        TR Vmax:        167.00 cm/s                             SHUNTS                            Systemic VTI:  0.10 m                            Systemic Diam: 2.00 cm Sanda Klein MD Electronically signed by Sanda Klein MD Signature Date/Time: 04/17/2022/4:11:22 PM    Final    CT L-SPINE NO CHARGE  Result Date: 04/17/2022 CLINICAL DATA:  Low back pain, increased fracture risk.  Back pain. EXAM: CT LUMBAR SPINE WITHOUT CONTRAST TECHNIQUE: Multidetector CT imaging of the lumbar spine was performed without intravenous contrast administration. Multiplanar CT image reconstructions were also generated. RADIATION DOSE REDUCTION: This exam was performed according to the departmental dose-optimization program which includes automated exposure control, adjustment of the  mA and/or kV according to patient size and/or use of iterative reconstruction technique. COMPARISON:  CT abdomen and pelvis performed today FINDINGS: Segmentation: 5 lumbar type vertebrae. Alignment: Normal. Vertebrae: No acute fracture or focal pathologic process. Paraspinal and other soft tissues: Negative. Disc levels: Disc spaces maintained.  No disc herniation. IMPRESSION: No acute bony abnormality. Electronically Signed    By: Rolm Baptise M.D.   On: 04/17/2022 02:15   CT ABDOMEN PELVIS WO CONTRAST  Result Date: 04/17/2022 CLINICAL DATA:  Abdominal/flank pain, stone suspected.  Back pain. EXAM: CT ABDOMEN AND PELVIS WITHOUT CONTRAST TECHNIQUE: Multidetector CT imaging of the abdomen and pelvis was performed following the standard protocol without IV contrast. RADIATION DOSE REDUCTION: This exam was performed according to the departmental dose-optimization program which includes automated exposure control, adjustment of the mA and/or kV according to patient size and/or use of iterative reconstruction technique. COMPARISON:  None Available. FINDINGS: Lower chest: No acute abnormality Hepatobiliary: No focal hepatic abnormality. Gallbladder unremarkable. Pancreas: No focal abnormality or ductal dilatation. Spleen: No focal abnormality.  Normal size. Adrenals/Urinary Tract: No adrenal abnormality. No focal renal abnormality. No stones or hydronephrosis. Urinary bladder is unremarkable. Stomach/Bowel: Normal appendix. Stomach, large and small bowel grossly unremarkable. Vascular/Lymphatic: Aortic atherosclerosis. No aneurysm. Shotty retroperitoneal lymph nodes measuring up to 8 mm in short axis diameter. No pathologically enlarged lymph nodes. Reproductive: No visible focal abnormality. Other: No free fluid or free air. Musculoskeletal: No acute bony abnormality. IMPRESSION: No acute findings in the abdomen or pelvis. Aortic atherosclerosis. Electronically Signed   By: Rolm Baptise M.D.   On: 04/17/2022 02:14   DG Chest Port 1 View  Result Date: 04/17/2022 CLINICAL DATA:  Chest pain EXAM: PORTABLE CHEST 1 VIEW COMPARISON:  08/08/2021 FINDINGS: Cardiac shadow is enlarged but stable. Postsurgical changes are again noted. Lungs are well aerated bilaterally. Central vascular congestion without significant edema is noted. No bony abnormality is noted. IMPRESSION: Changes of mild CHF. Electronically Signed   By: Inez Catalina M.D.   On:  04/17/2022 00:40        Scheduled Meds:  artificial tears   Right Eye Q4H   aspirin EC  81 mg Oral Daily   enoxaparin (LOVENOX) injection  40 mg Subcutaneous Daily   famotidine  20 mg Oral Daily   insulin aspart  0-15 Units Subcutaneous TID WC   insulin aspart  0-5 Units Subcutaneous QHS   insulin glargine-yfgn  50 Units Subcutaneous Daily   metoprolol tartrate  2.5 mg Intravenous Q12H   pantoprazole  40 mg Oral Daily   polyethylene glycol  17 g Oral Daily   potassium chloride  20 mEq Oral BID   predniSONE  40 mg Oral Q breakfast   rosuvastatin  20 mg Oral QPM   sodium chloride flush  3 mL Intravenous Q12H   Continuous Infusions:  linezolid (ZYVOX) IV 600 mg (04/18/22 0055)   penicillin G potassium 12 Million Units in dextrose 5 % 500 mL CONTINUOUS infusion 12 Million Units (04/18/22 0214)     LOS: 0 days    Time spent: over 30 min    Fayrene Helper, MD [facility]   To contact the attending provider between 7A-7P or the covering provider during after hours 7P-7A, please log into the web site [email or web address] and access using universal [place] password for that web site. If you do not have the password, please call the hospital operator.  04/18/2022, 10:07 AM

## 2022-04-18 NOTE — Consult Note (Signed)
   [facility] CM Inpatient Consult   04/18/2022  [name] [name] Jul 06, 1955 [id number]  [facility] [facility] [ACO] Patient: [employer] Medicare  Primary Care Provider:  [name], MD with [facility]   Patient is currently active with [facility] for chronic disease management services.  Patient has been engaged by a [place] and  Clinical research associate.  Our community based plan of care has focused on disease management and community resource support.    Met patient at bedside, appears ill.  Patient is short of breath and states, "I'm ready to get out of here, I can move my legs now."  Explained to him \ that he may need more care before going home. Explained reason for visit and left a 24 hour nurse advise line magnet with papers on bedside table.  Patient expresses ongoing follow up with [facility] CC team.  Plan:   Continue to follow  Of note, [facility] Care Management services does not replace or interfere with any services that are needed or arranged by inpatient [facility] care management team.   For additional questions or referrals please contact:  [name], RN BSN [place]  [phone] business mobile phone Toll free office [phone]  *[place]  [phone] Fax number: [phone] [email or web address]

## 2022-04-18 NOTE — Progress Notes (Signed)
ANTICOAGULATION CONSULT NOTE - Initial Consult  Pharmacy Consult for heparin Indication: pulmonary embolus  Allergies  Allergen Reactions   Empagliflozin Rash    Patient reports yeast.    Ibuprofen Other (See Comments)    Stomach pain    Patient Measurements: Height: '5\' 11"'$  (180.3 cm) Weight: (!) 140.6 kg (309 lb 15.5 oz) IBW/kg (Calculated) : 75.3 Heparin Dosing Weight: 107kg  Vital Signs: Temp: 98.3 F (36.8 C) (03/08 1141) Temp Source: Oral (03/08 1141) BP: 115/65 (03/08 1141) Pulse Rate: 93 (03/08 1141)  Labs: Recent Labs    04/17/22 0045 04/17/22 0240 04/17/22 0830 04/18/22 0050  HGB 14.0  --  14.4 14.0  HCT 41.6  --  43.9 40.5  PLT 189  --  185 155  CREATININE 1.62*  --  1.47* 2.95*  TROPONINIHS 11 11  --   --     Estimated Creatinine Clearance: 34.4 mL/min (A) (by C-G formula based on SCr of 2.95 mg/dL (H)).   Medical History: Past Medical History:  Diagnosis Date   Acute myocardial infarction, unspecified site, episode of care unspecified 2005   Acute pancreatitis    CAD (coronary artery disease)    a. CABG in 2005 w LIMA to LAD, left radial to second circumflex marginal, saphenous vein graft to PDA, saphenous vein graft to lateral subbrach of ramus intermediate, and sequential saphenous vein graft to the medial subbranch of ramus intermediate.   Chronic diastolic CHF (congestive heart failure) (HCC)    Cirrhosis of liver without mention of alcohol    CKD (chronic kidney disease), stage II    Complications affecting other specified body systems, hypertension    Ectopic atrial rhythm    Essential hypertension    Hyperlipidemia    Hypokalemia    a. intermitttent noncompliance with potassium supplement.   Hypomagnesemia    Morbid obesity (HCC)    Neuropathy    Other and unspecified hyperlipidemia    Proteinuria    RBBB    Sleep apnea    Type II or unspecified type diabetes mellitus without mention of complication, not stated as uncontrolled     Varicose veins of both lower extremities     Medications:  Medications Prior to Admission  Medication Sig Dispense Refill Last Dose   aspirin EC 81 MG tablet Take 81 mg by mouth daily.   04/16/2022   diclofenac Sodium (VOLTAREN) 1 % GEL APPLY 2 GRAMS TOPICALLY 4 TIMES DAILY TO KNEE 100 g 2 04/16/2022   eplerenone (INSPRA) 25 MG tablet TAKE 1 TABLET(25 MG) BY MOUTH DAILY 90 tablet 1 04/16/2022   famotidine (PEPCID) 20 MG tablet TAKE 1 TABLET(20 MG) BY MOUTH DAILY (Patient taking differently: Take 20 mg by mouth daily as needed for heartburn.) 90 tablet 1    fluticasone (FLONASE) 50 MCG/ACT nasal spray Place 1 spray into both nostrils daily. 16 g 12 unk   Fluticasone-Umeclidin-Vilant (TRELEGY ELLIPTA) 100-62.5-25 MCG/ACT AEPB Inhale 1 puff into the lungs daily. 180 each 3 unk   HUMALOG 100 UNIT/ML injection Inject 40 Units into the skin See admin instructions. Per sliding scale patient injects 40 units if blood sugar is 400.      HUMALOG KWIKPEN 200 UNIT/ML KwikPen Inject into the skin.      hydroxypropyl methylcellulose / hypromellose (ISOPTO TEARS / GONIOVISC) 2.5 % ophthalmic solution Place 2 drops into both eyes as needed for dry eyes. 15 mL 12 unk   LANTUS SOLOSTAR 100 UNIT/ML Solostar Pen Inject 50 Units into the skin every morning.  04/16/2022   losartan (COZAAR) 50 MG tablet TAKE 1/2 TABLET(25 MG) BY MOUTH DAILY 45 tablet 3 04/16/2022   MAGNESIUM-OXIDE 400 (241.3 Mg) MG tablet Take 1 tablet by mouth daily.   04/16/2022   metoprolol tartrate (LOPRESSOR) 50 MG tablet TAKE 1 TABLET(50 MG) BY MOUTH TWICE DAILY (Patient taking differently: Take 50 mg by mouth 2 (two) times daily.) 180 tablet 2 04/16/2022 at 0300   nitroGLYCERIN (NITROSTAT) 0.4 MG SL tablet Place 1 tablet (0.4 mg total) under the tongue every 5 (five) minutes as needed for chest pain. 25 tablet 3 unk   polyethylene glycol (MIRALAX / GLYCOLAX) 17 g packet Take 17 g by mouth daily. 14 each 0 unk   potassium chloride SA (KLOR-CON M) 20 MEQ  tablet TAKE 2 TABLETS(40 MEQ) BY MOUTH THREE TIMES DAILY 540 tablet 0 04/16/2022   predniSONE (DELTASONE) 10 MG tablet Take 6 tablets (60 mg total) by mouth daily for 7 days. 42 tablet 0 04/16/2022   PROAIR HFA 108 (90 Base) MCG/ACT inhaler Inhale 2 puffs into the lungs every 6 (six) hours as needed for wheezing or shortness of breath. 8.5 g 5    silver sulfADIAZINE (SSD) 1 % cream APPLY PEA SIZED AMOUNT TOPICALLY TO WOUND DAILY 50 g 0 04/16/2022   torsemide (DEMADEX) 20 MG tablet TAKE 1 TABLET(20 MG) BY MOUTH TWICE DAILY (Patient taking differently: Take 40 mg by mouth daily.) 180 tablet 2 04/16/2022   B-D ULTRAFINE III SHORT PEN 31G X 8 MM MISC   1    chlorhexidine (HIBICLENS) 4 % external liquid Apply topically daily as needed. (Patient not taking: Reported on 04/17/2022) 120 mL 0 Not Taking   collagenase (SANTYL) ointment Apply 1 application topically daily. Right ankle measures 6.0 x 0.3cm. (Patient not taking: Reported on 04/17/2022) 30 g 5 Not Taking   glucose blood (ONETOUCH ULTRA) test strip TEST AS DIRECTED 8 TIMES DAILY 800 strip 0    HUMULIN R U-500 KWIKPEN 500 UNIT/ML kwikpen Inject 50-60 Units into the skin See admin instructions. 60 units in the AM, 50 units at lunch and 50 in the evening (Patient not taking: Reported on 04/17/2022) 3 mL 0 Not Taking   ipratropium-albuterol (DUONEB) 0.5-2.5 (3) MG/3ML SOLN 1 vial in nebulizer up to 4 times daily (Patient not taking: Reported on 04/17/2022) 90 mL 12 Not Taking   Lancets (ACCU-CHEK MULTICLIX) lancets       meloxicam (MOBIC) 15 MG tablet Take 1 tablet (15 mg total) by mouth daily. (Patient not taking: Reported on 04/17/2022) 30 tablet 0 Not Taking   nystatin ointment (MYCOSTATIN) Apply 1 application topically 2 (two) times daily. (Patient not taking: Reported on 04/17/2022) 30 g 0 Not Taking   ofloxacin (OCUFLOX) 0.3 % ophthalmic solution Place 1 drop into the right eye 4 (four) times daily. (Patient not taking: Reported on 04/17/2022)   Not Taking   pregabalin  (LYRICA) 50 MG capsule Take 1 capsule (50 mg total) by mouth at bedtime as needed (nerve pain). (Patient not taking: Reported on 04/17/2022) 30 capsule 0 Not Taking   rosuvastatin (CRESTOR) 20 MG tablet Take 1 tablet (20 mg total) by mouth every evening. (Patient not taking: Reported on 04/17/2022) 30 tablet 11 Completed Course   Scheduled:   artificial tears   Right Eye Q4H   aspirin EC  81 mg Oral Daily   famotidine  20 mg Oral Daily   heparin  3,000 Units Intravenous Once   insulin aspart  0-15 Units Subcutaneous TID WC  insulin aspart  0-5 Units Subcutaneous QHS   insulin aspart  5 Units Subcutaneous TID WC   insulin glargine-yfgn  50 Units Subcutaneous Daily   metoprolol tartrate  12.5 mg Oral BID   pantoprazole  40 mg Oral Daily   polyethylene glycol  17 g Oral Daily   predniSONE  40 mg Oral Q breakfast   rosuvastatin  20 mg Oral QPM   sodium chloride flush  3 mL Intravenous Q12H   Infusions:   heparin     penicillin G potassium 12 Million Units in dextrose 5 % 500 mL CONTINUOUS infusion 12 Million Units (04/18/22 1144)    Assessment: Pt was admitted for GAS bacteremia/PNA. V/Q showed PE today. Heparin ordered for anticoagulation. He got Lovenox '40mg'$  this AM.   Scr up 2.95 Hgb/plt wnl  Goal of Therapy:  Heparin level 0.3-0.7 units/ml Monitor platelets by anticoagulation protocol: Yes   Plan:  Dc lovenox Heparin bolus 3000 units x1 Heparin infusion 1700 units/hr Check 8 hr HL then daily  Onnie Boer, PharmD, [place], AAHIVP, CPP Infectious Disease Pharmacist 04/18/2022 4:13 PM

## 2022-04-18 NOTE — Progress Notes (Signed)
Lower extremity venous bilateral study completed.   Please see CV Proc for preliminary results.   Kenechukwu Eckstein, RDMS, RVT  

## 2022-04-18 NOTE — Plan of Care (Signed)
  Problem: Coping: Goal: Level of anxiety will decrease Outcome: Progressing   Problem: Pain Managment: Goal: General experience of comfort will improve Outcome: Progressing   Problem: Safety: Goal: Ability to remain free from injury will improve Outcome: Progressing   

## 2022-04-19 ENCOUNTER — Inpatient Hospital Stay (HOSPITAL_COMMUNITY): Payer: 59

## 2022-04-19 DIAGNOSIS — I071 Rheumatic tricuspid insufficiency: Secondary | ICD-10-CM | POA: Insufficient documentation

## 2022-04-19 DIAGNOSIS — I2609 Other pulmonary embolism with acute cor pulmonale: Secondary | ICD-10-CM | POA: Diagnosis not present

## 2022-04-19 DIAGNOSIS — I50810 Right heart failure, unspecified: Secondary | ICD-10-CM | POA: Insufficient documentation

## 2022-04-19 DIAGNOSIS — I2699 Other pulmonary embolism without acute cor pulmonale: Secondary | ICD-10-CM | POA: Insufficient documentation

## 2022-04-19 DIAGNOSIS — I50812 Chronic right heart failure: Secondary | ICD-10-CM | POA: Insufficient documentation

## 2022-04-19 DIAGNOSIS — J9601 Acute respiratory failure with hypoxia: Secondary | ICD-10-CM | POA: Diagnosis not present

## 2022-04-19 LAB — GLUCOSE, CAPILLARY
Glucose-Capillary: 265 mg/dL — ABNORMAL HIGH (ref 70–99)
Glucose-Capillary: 273 mg/dL — ABNORMAL HIGH (ref 70–99)
Glucose-Capillary: 301 mg/dL — ABNORMAL HIGH (ref 70–99)
Glucose-Capillary: 376 mg/dL — ABNORMAL HIGH (ref 70–99)
Glucose-Capillary: 364 mg/dL — ABNORMAL HIGH (ref 70–99)

## 2022-04-19 LAB — BASIC METABOLIC PANEL
Anion gap: 12 (ref 5–15)
BUN: 42 mg/dL — ABNORMAL HIGH (ref 8–23)
CO2: 20 mmol/L — ABNORMAL LOW (ref 22–32)
Calcium: 8.3 mg/dL — ABNORMAL LOW (ref 8.9–10.3)
Chloride: 97 mmol/L — ABNORMAL LOW (ref 98–111)
Creatinine, Ser: 2.36 mg/dL — ABNORMAL HIGH (ref 0.61–1.24)
GFR, Estimated: 29 mL/min — ABNORMAL LOW (ref >60)
Glucose, Bld: 262 mg/dL — ABNORMAL HIGH (ref 70–99)
Potassium: 4.3 mmol/L (ref 3.5–5.1)
Sodium: 129 mmol/L — ABNORMAL LOW (ref 135–145)

## 2022-04-19 LAB — CBC
HCT: 37.5 % — ABNORMAL LOW (ref 39.0–52.0)
Hemoglobin: 12.9 g/dL — ABNORMAL LOW (ref 13.0–17.0)
MCH: 34.5 pg — ABNORMAL HIGH (ref 26.0–34.0)
MCHC: 34.4 g/dL (ref 30.0–36.0)
MCV: 100.3 fL — ABNORMAL HIGH (ref 80.0–100.0)
Platelets: 148 10*3/uL — ABNORMAL LOW (ref 150–400)
RBC: 3.74 MIL/uL — ABNORMAL LOW (ref 4.22–5.81)
RDW: 15.2 % (ref 11.5–15.5)
WBC: 14.3 10*3/uL — ABNORMAL HIGH (ref 4.0–10.5)
nRBC: 0 % (ref 0.0–0.2)

## 2022-04-19 LAB — CULTURE, BLOOD (ROUTINE X 2): Special Requests: ADEQUATE

## 2022-04-19 LAB — HEPARIN LEVEL (UNFRACTIONATED)
Heparin Unfractionated: 0.42 IU/mL (ref 0.30–0.70)
Heparin Unfractionated: 0.21 IU/mL — ABNORMAL LOW (ref 0.30–0.70)
Heparin Unfractionated: 0.23 IU/mL — ABNORMAL LOW (ref 0.30–0.70)

## 2022-04-19 MED ORDER — INSULIN ASPART 100 UNIT/ML IJ SOLN
8.0000 [IU] | Freq: Three times a day (TID) | INTRAMUSCULAR | Status: DC
  Administered 2022-04-19 – 2022-04-21 (×6): 8 [IU] via SUBCUTANEOUS

## 2022-04-19 MED ORDER — INSULIN ASPART 100 UNIT/ML IJ SOLN: 0.0000 [IU] | Freq: Every day | INTRAMUSCULAR | Status: DC

## 2022-04-19 MED ORDER — INSULIN ASPART 100 UNIT/ML IJ SOLN
0.0000 [IU] | Freq: Three times a day (TID) | INTRAMUSCULAR | Status: DC
  Administered 2022-04-19: 15 [IU] via SUBCUTANEOUS
  Administered 2022-04-19: 20 [IU] via SUBCUTANEOUS
  Administered 2022-04-20 (×3): 7 [IU] via SUBCUTANEOUS
  Administered 2022-04-21: 4 [IU] via SUBCUTANEOUS
  Administered 2022-04-22 – 2022-04-23 (×3): 3 [IU] via SUBCUTANEOUS
  Administered 2022-04-24: 4 [IU] via SUBCUTANEOUS

## 2022-04-19 MED ORDER — HEPARIN BOLUS VIA INFUSION
1000.0000 [IU] | Freq: Once | INTRAVENOUS | Status: AC
  Administered 2022-04-19: 1000 [IU] via INTRAVENOUS
  Filled 2022-04-19: qty 1000

## 2022-04-19 NOTE — Progress Notes (Signed)
Refused cpap

## 2022-04-19 NOTE — Progress Notes (Signed)
ANTICOAGULATION CONSULT NOTE  Pharmacy Consult for heparin Indication: pulmonary embolus  Allergies  Allergen Reactions   Empagliflozin Rash    Patient reports yeast.    Ibuprofen Other (See Comments)    Stomach pain    Patient Measurements: Height: '5\' 11"'$  (180.3 cm) Weight: (!) 140.3 kg (309 lb 4.9 oz) IBW/kg (Calculated) : 75.3 Heparin Dosing Weight: 107kg  Vital Signs: Temp: 97.7 F (36.5 C) (03/09 2028) Temp Source: Oral (03/09 2028) BP: 125/79 (03/09 2028) Pulse Rate: 70 (03/09 1553)  Labs: Recent Labs    04/17/22 0045 04/17/22 0240 04/17/22 0830 04/18/22 0050 04/19/22 0123 04/19/22 0124 04/19/22 0125 04/19/22 1040 04/19/22 2002  HGB 14.0  --  14.4 14.0  --   --  12.9*  --   --   HCT 41.6  --  43.9 40.5  --   --  37.5*  --   --   PLT 189  --  185 155  --   --  148*  --   --   HEPARINUNFRC  --   --   --   --  0.42  --   --  0.21* 0.23*  CREATININE 1.62*  --  1.47* 2.95*  --  2.36*  --   --   --   TROPONINIHS 11 11  --   --   --   --   --   --   --      Estimated Creatinine Clearance: 42.9 mL/min (A) (by C-G formula based on SCr of 2.36 mg/dL (H)).  Assessment: 67 y.o. male with GAS bacteremia/PNA. V/Q showed PE on 3/8. Pharmacy consulted for heparin dosing.   Heparin level remains subtherapeutic at 0.23, on 1900 units/hr. Hgb 12.9, plt 148--stable. No s/sx of bleeding or infusion issues per nursing.   Goal of Therapy:  Heparin level 0.3-0.7 units/ml Monitor platelets by anticoagulation protocol: Yes   Plan:  Order heparin bolus of 1000 units Increase heparin rate to 2100 units/hr  F/u 8 hr heparin level  Monitor CBC, heparin level, and s/sx of bleeding daily   Antonietta Jewel, PharmD, BCCCP Clinical Pharmacist  Phone: [phone] 04/19/2022 8:45 PM  Please check AMION for all [place] phone numbers After 10:00 PM, call [place] [phone]

## 2022-04-19 NOTE — Progress Notes (Signed)
DAILY PROGRESS NOTE   Patient Name: VIRGIN MAKOWSKI Date of Encounter: 04/19/2022 Cardiologist: Candee Furbish, MD  Chief Complaint   Short of breath  Patient Profile   JEROMIAH COPELIN is a 67 y.o. male with a hx of CAD s/p CABG, chronic diastolic heart failure, hypertension, liver cirrhosis, hypokalemia, OSA, RBBB, history of ectopic atrial rhythm, CKD, hyperlipidemia, morbid obesity, DM2 , PVD, who is being seen 04/18/2022 for the evaluation of right heart failure at the request of Dr. Florene Glen.   Subjective   Short of breath today. Found to have a high prob segmental PE based on V/Q yesterday. Initial LE dopplers negative for DVT. On IV heparin. Concern for rising creatinine - repeat is pending today. Off diuretics. ID treating bacteremia - concern that severe TR may mean vegetation - they are requesting TEE next week. The severe TR may also be related to PE and could improve with resolution of this.  Objective   Vitals:   04/18/22 2053 04/19/22 0018 04/19/22 0259 04/19/22 0555  BP: 115/70 107/72 104/68 110/75  Pulse: 85 76  71  Resp:  16  16  Temp:  98.3 F (36.8 C)  98 F (36.7 C)  TempSrc:  Oral  Oral  SpO2:  97%  97%  Weight:  (!) 140.3 kg    Height:        Intake/Output Summary (Last 24 hours) at 04/19/2022 0835 Last data filed at 04/19/2022 [id number] Gross per 24 hour  Intake 1992.15 ml  Output 850 ml  Net 1142.15 ml   Filed Weights   04/17/22 1544 04/18/22 0025 04/19/22 0018  Weight: (!) 139.5 kg (!) 140.6 kg (!) 140.3 kg    Physical Exam   General appearance: alert, mild distress, and morbidly obese Neck: no carotid bruit, no JVD, and thyroid not enlarged, symmetric, no tenderness/mass/nodules Lungs: diminished breath sounds bilaterally Heart: regular rate and rhythm Abdomen: soft, non-tender; bowel sounds normal; no masses,  no organomegaly and obese Extremities: edema trace Pulses: 2+ and symmetric Skin: Skin color, texture, turgor normal. No rashes or  lesions Neurologic: Grossly normal Psych: Slowed mentation  Inpatient Medications    Scheduled Meds:  artificial tears   Right Eye Q4H   aspirin EC  81 mg Oral Daily   famotidine  20 mg Oral Daily   insulin aspart  0-15 Units Subcutaneous TID WC   insulin aspart  0-5 Units Subcutaneous QHS   insulin aspart  5 Units Subcutaneous TID WC   insulin glargine-yfgn  50 Units Subcutaneous Daily   metoprolol tartrate  12.5 mg Oral BID   pantoprazole  40 mg Oral Daily   polyethylene glycol  17 g Oral Daily   predniSONE  40 mg Oral Q breakfast   rosuvastatin  20 mg Oral QPM   sodium chloride flush  3 mL Intravenous Q12H    Continuous Infusions:  heparin 1,700 Units/hr (04/19/22 0445)   penicillin G potassium 12 Million Units in dextrose 5 % 500 mL CONTINUOUS infusion 41.7 mL/hr at 04/19/22 0307    PRN Meds: acetaminophen **OR** acetaminophen, [COMPLETED] ipratropium-albuterol **FOLLOWED BY** ipratropium-albuterol, ondansetron **OR** ondansetron (ZOFRAN) IV, pregabalin   Labs   Results for orders placed or performed during the hospital encounter of 04/16/22 (from the past 48 hour(s))  CBG monitoring, ED     Status: Abnormal   Collection Time: 04/17/22  8:37 AM  Result Value Ref Range   Glucose-Capillary 181 (H) 70 - 99 mg/dL    Comment: Glucose reference  range applies only to samples taken after fasting for at least 8 hours.  CBG monitoring, ED     Status: Abnormal   Collection Time: 04/17/22 11:07 AM  Result Value Ref Range   Glucose-Capillary 206 (H) 70 - 99 mg/dL    Comment: Glucose reference range applies only to samples taken after fasting for at least 8 hours.  Glucose, capillary     Status: Abnormal   Collection Time: 04/17/22  3:58 PM  Result Value Ref Range   Glucose-Capillary 169 (H) 70 - 99 mg/dL    Comment: Glucose reference range applies only to samples taken after fasting for at least 8 hours.  Glucose, capillary     Status: Abnormal   Collection Time: 04/17/22   9:17 PM  Result Value Ref Range   Glucose-Capillary 155 (H) 70 - 99 mg/dL    Comment: Glucose reference range applies only to samples taken after fasting for at least 8 hours.   Comment 1 Notify RN    Comment 2 Document in Chart   Comprehensive metabolic panel     Status: Abnormal   Collection Time: 04/18/22 12:50 AM  Result Value Ref Range   Sodium 133 (L) 135 - 145 mmol/L   Potassium 4.5 3.5 - 5.1 mmol/L   Chloride 98 98 - 111 mmol/L   CO2 20 (L) 22 - 32 mmol/L   Glucose, Bld 208 (H) 70 - 99 mg/dL    Comment: Glucose reference range applies only to samples taken after fasting for at least 8 hours.   BUN 34 (H) 8 - 23 mg/dL   Creatinine, Ser 2.95 (H) 0.61 - 1.24 mg/dL    Comment: RESULTS VERIFIED BY REPEAT TESTING   Calcium 8.4 (L) 8.9 - 10.3 mg/dL   Total Protein 7.9 6.5 - 8.1 g/dL   Albumin 2.8 (L) 3.5 - 5.0 g/dL   AST 75 (H) 15 - 41 U/L   ALT 28 0 - 44 U/L   Alkaline Phosphatase 99 38 - 126 U/L   Total Bilirubin 3.2 (H) 0.3 - 1.2 mg/dL   GFR, Estimated 22 (L) >60 mL/min    Comment: (NOTE) Calculated using the CKD-EPI Creatinine Equation (2021)    Anion gap 15 5 - 15    Comment: Performed at [facility], [place] [address] [place], [place] [address]  CBC     Status: Abnormal   Collection Time: 04/18/22 12:50 AM  Result Value Ref Range   WBC 17.5 (H) 4.0 - 10.5 K/uL   RBC 3.96 (L) 4.22 - 5.81 MIL/uL   Hemoglobin 14.0 13.0 - 17.0 g/dL   HCT 40.5 39.0 - 52.0 %   MCV 102.3 (H) 80.0 - 100.0 fL   MCH 35.4 (H) 26.0 - 34.0 pg   MCHC 34.6 30.0 - 36.0 g/dL   RDW 15.7 (H) 11.5 - 15.5 %   Platelets 155 150 - 400 K/uL   nRBC 0.2 0.0 - 0.2 %    Comment: Performed at [facility], [place] [address], [place], [place] [address]  Brain natriuretic peptide     Status: Abnormal   Collection Time: 04/18/22 12:50 AM  Result Value Ref Range   B Natriuretic Peptide 938.7 (H) 0.0 - 100.0 pg/mL    Comment: Performed at [facility] [address], [place], [place] [address]   Strep pneumoniae urinary antigen     Status: None   Collection Time: 04/18/22  6:13 AM  Result Value Ref Range  Strep Pneumo Urinary Antigen NEGATIVE NEGATIVE    Comment:        Infection due to S. pneumoniae cannot be absolutely ruled out since the antigen present may be below the detection limit of the test. Performed at [facility], [address], [place], [place] [address]   Glucose, capillary     Status: Abnormal   Collection Time: 04/18/22  6:15 AM  Result Value Ref Range   Glucose-Capillary 261 (H) 70 - 99 mg/dL    Comment: Glucose reference range applies only to samples taken after fasting for at least 8 hours.   Comment 1 Notify RN    Comment 2 Document in Chart   Glucose, capillary     Status: Abnormal   Collection Time: 04/18/22 11:25 AM  Result Value Ref Range   Glucose-Capillary 251 (H) 70 - 99 mg/dL    Comment: Glucose reference range applies only to samples taken after fasting for at least 8 hours.  Glucose, capillary     Status: Abnormal   Collection Time: 04/18/22  4:26 PM  Result Value Ref Range   Glucose-Capillary 248 (H) 70 - 99 mg/dL    Comment: Glucose reference range applies only to samples taken after fasting for at least 8 hours.  Glucose, capillary     Status: Abnormal   Collection Time: 04/18/22  9:02 PM  Result Value Ref Range   Glucose-Capillary 271 (H) 70 - 99 mg/dL    Comment: Glucose reference range applies only to samples taken after fasting for at least 8 hours.  Heparin level (unfractionated)     Status: None   Collection Time: 04/19/22  1:23 AM  Result Value Ref Range   Heparin Unfractionated 0.42 0.30 - 0.70 IU/mL    Comment: (NOTE) The clinical reportable range upper limit is being lowered to >1.10 to align with the FDA approved guidance for the current laboratory assay.  If heparin results are below expected values, and patient dosage has  been confirmed, suggest follow up testing of antithrombin III levels. Performed at  [facility], [place] [address], [place], [place] [address]   CBC     Status: Abnormal   Collection Time: 04/19/22  1:25 AM  Result Value Ref Range   WBC 14.3 (H) 4.0 - 10.5 K/uL   RBC 3.74 (L) 4.22 - 5.81 MIL/uL   Hemoglobin 12.9 (L) 13.0 - 17.0 g/dL   HCT 37.5 (L) 39.0 - 52.0 %   MCV 100.3 (H) 80.0 - 100.0 fL   MCH 34.5 (H) 26.0 - 34.0 pg   MCHC 34.4 30.0 - 36.0 g/dL   RDW 15.2 11.5 - 15.5 %   Platelets 148 (L) 150 - 400 K/uL   nRBC 0.0 0.0 - 0.2 %    Comment: Performed at [facility] [address] [place], [place] [address]  Glucose, capillary     Status: Abnormal   Collection Time: 04/19/22  6:22 AM  Result Value Ref Range   Glucose-Capillary 265 (H) 70 - 99 mg/dL    Comment: Glucose reference range applies only to samples taken after fasting for at least 8 hours.   Comment 1 Notify RN    Comment 2 Document in Chart   Glucose, capillary     Status: Abnormal   Collection Time: 04/19/22  8:19 AM  Result Value Ref Range   Glucose-Capillary 273 (H) 70 - 99 mg/dL    Comment: Glucose reference range applies only to samples taken after  fasting for at least 8 hours.   Comment 1 Notify RN    Comment 2 Document in Chart     ECG   N/A - Personally Reviewed  Telemetry   Sinus rhythm - Personally Reviewed  Radiology    MR LUMBAR SPINE WO CONTRAST  Result Date: 04/18/2022 CLINICAL DATA:  Low back pain, infection suspected EXAM: MRI LUMBAR SPINE WITHOUT CONTRAST TECHNIQUE: Multiplanar, multisequence MR imaging of the lumbar spine was performed. No intravenous contrast was administered. COMPARISON:  No prior MRI of the lumbar spine, correlation is made with CT lumbar spine 04/17/2022 FINDINGS: Evaluation is somewhat limited by motion artifact. Segmentation:  5 lumbar type vertebral bodies. Alignment: No significant listhesis. Straightening of the normal lumbar lordosis. Vertebrae: No fracture, evidence of discitis, or bone lesion. Congenitally short pedicles, which narrow  the AP diameter of the spinal canal. Conus medullaris and cauda equina: Limited evaluation due to motion artifact and thecal sac narrowing, in part due to epidural lipomatosis. Paraspinal and other soft tissues: Limited by motion. No acute finding. Disc levels: Disc hydration is largely preserved. T12-L1: No significant disc bulge. Moderate thecal sac narrowing, in part secondary to epidural lipomatosis. No neural foraminal narrowing. L1-L2: No significant disc bulge. Mild facet arthropathy. Moderate thecal sac narrowing, in part secondary to epidural lipomatosis. No neural foraminal narrowing. L2-L3: No significant disc bulge. Mild facet arthropathy. Mild to moderate thecal sac narrowing, in part secondary to epidural lipomatosis. No neural foraminal narrowing. L3-L4: Minimal disc bulge. Moderate facet arthropathy. Moderate thecal sac narrowing, in part secondary to epidural lipomatosis. Mild bilateral neural foraminal narrowing. L4-L5: Mild disc bulge. Moderate facet arthropathy. Moderate thecal sac narrowing, in part secondary to epidural lipomatosis. Mild bilateral neural foraminal narrowing. L5-S1: Minimal disc bulge. Mild facet arthropathy. Moderate thecal sac narrowing, secondary to epidural lipomatosis. Mild right neural foraminal narrowing. IMPRESSION: 1. Evaluation is limited by motion artifact. 2. No evidence of discitis-osteomyelitis. No epidural abscess. 3. Congenitally short pedicles, which narrow the AP diameter of the spinal canal, and epidural lipomatosis, which results in moderate thecal sac narrowing at multiple levels. 4. Mild neural foraminal narrowing bilaterally at L3-L4 and L4-L5 and on the right at L5-S1. Electronically Signed   By: Merilyn Baba M.D.   On: 04/18/2022 23:05   NM Pulmonary Perfusion  Result Date: 04/18/2022 CLINICAL DATA:  Hypoxia EXAM: NUCLEAR MEDICINE PERFUSION LUNG SCAN TECHNIQUE: Perfusion images were obtained in multiple projections after intravenous injection of  radiopharmaceutical. Ventilation scans intentionally deferred if perfusion scan and chest x-ray adequate for interpretation during COVID 19 epidemic. RADIOPHARMACEUTICALS:  4.0 mCi Tc-20mMAA IV COMPARISON:  Portable chest radiograph and CT from the same day FINDINGS: Significant perfusion attenuation involving the entire right upper lobe. Otherwise physiologic distribution of radiopharmaceutical. IMPRESSION: Solitary right upper lobe perfusion defect without radiographic correlate, suggestive of pulmonary embolus. Electronically Signed   By: DLucrezia EuropeM.D.   On: 04/18/2022 14:53   DG CHEST PORT 1 VIEW  Result Date: 04/18/2022 CLINICAL DATA:  Respiratory failure with hypoxia. EXAM: PORTABLE CHEST 1 VIEW COMPARISON:  April 17, 2022. FINDINGS: Stable cardiomegaly with mild central pulmonary vascular congestion. Status post coronary artery bypass graft. Lungs are clear. Bony thorax is unremarkable. IMPRESSION: Stable cardiomegaly with mild central pulmonary vascular congestion. Electronically Signed   By: JMarijo ConceptionM.D.   On: 04/18/2022 12:49   VAS [place]OWER EXTREMITY VENOUS (DVT)  Result Date: 04/18/2022  Lower Venous DVT Study Patient Name:  JGLENNIE SENICK Date of Exam:  04/18/2022 Medical Rec #: [id number]        Accession #:    [id number] Date of Birth: January 18, 1956        Patient Gender: M Patient Age:   49 years Exam Location:  [facility] Procedure:      VAS [place] LOWER EXTREMITY VENOUS (DVT) Referring Phys: A POWELL JR --------------------------------------------------------------------------------  Indications: Edema.  Comparison Study: 11-07-2020 Prior right lower extremity venous study was                   negative for DVT. Performing Technologist: Darlin Coco RDMS, RVT  Examination Guidelines: A complete evaluation includes B-mode imaging, spectral Doppler, color Doppler, and power Doppler as needed of all accessible portions of each vessel. Bilateral testing is considered an integral part of  a complete examination. Limited examinations for reoccurring indications may be performed as noted. The reflux portion of the exam is performed with the patient in reverse Trendelenburg.  +---------+---------------+---------+-----------+----------+--------------+ RIGHT    CompressibilityPhasicitySpontaneityPropertiesThrombus Aging +---------+---------------+---------+-----------+----------+--------------+ CFV      Full           Yes      Yes                                 +---------+---------------+---------+-----------+----------+--------------+ SFJ      Full                                                        +---------+---------------+---------+-----------+----------+--------------+ FV Prox  Full                                                        +---------+---------------+---------+-----------+----------+--------------+ FV Mid   Full                                                        +---------+---------------+---------+-----------+----------+--------------+ FV DistalFull                                                        +---------+---------------+---------+-----------+----------+--------------+ PFV      Full                                                        +---------+---------------+---------+-----------+----------+--------------+ POP      Full           Yes      Yes                                 +---------+---------------+---------+-----------+----------+--------------+ PTV      Full                                                        +---------+---------------+---------+-----------+----------+--------------+  PERO     Full                                                        +---------+---------------+---------+-----------+----------+--------------+   +---------+---------------+---------+-----------+----------+--------------+ LEFT     CompressibilityPhasicitySpontaneityPropertiesThrombus Aging  +---------+---------------+---------+-----------+----------+--------------+ CFV      Full           Yes      Yes                                 +---------+---------------+---------+-----------+----------+--------------+ SFJ      Full                                                        +---------+---------------+---------+-----------+----------+--------------+ FV Prox  Full                                                        +---------+---------------+---------+-----------+----------+--------------+ FV Mid   Full                                                        +---------+---------------+---------+-----------+----------+--------------+ FV DistalFull                                                        +---------+---------------+---------+-----------+----------+--------------+ PFV      Full                                                        +---------+---------------+---------+-----------+----------+--------------+ POP      Full           Yes      Yes                                 +---------+---------------+---------+-----------+----------+--------------+ PTV      Full                                                        +---------+---------------+---------+-----------+----------+--------------+ PERO     Full                                                        +---------+---------------+---------+-----------+----------+--------------+  Summary: RIGHT: - There is no evidence of deep vein thrombosis in the lower extremity.  - No cystic structure found in the popliteal fossa.  LEFT: - There is no evidence of deep vein thrombosis in the lower extremity.  - No cystic structure found in the popliteal fossa.  *See table(s) above for measurements and observations.    Preliminary    CT CHEST WO CONTRAST  Result Date: 04/18/2022 CLINICAL DATA:  Pneumonia, complication suspected, xray done EXAM: CT CHEST WITHOUT CONTRAST TECHNIQUE:  Multidetector CT imaging of the chest was performed following the standard protocol without IV contrast. RADIATION DOSE REDUCTION: This exam was performed according to the departmental dose-optimization program which includes automated exposure control, adjustment of the mA and/or kV according to patient size and/or use of iterative reconstruction technique. COMPARISON:  04/17/2022 FINDINGS: Cardiovascular: Unenhanced imaging of the heart demonstrates moderate cardiomegaly, with prominent dilatation of the atria. No pericardial effusion. Postsurgical changes from median sternotomy and CABG. Diffuse atherosclerosis throughout the native coronary vessels. Normal caliber of the thoracic aorta. Atherosclerosis of the aortic arch. Evaluation of the vascular lumen is limited without IV contrast. Mediastinum/Nodes: No enlarged mediastinal or axillary lymph nodes. Thyroid gland, trachea, and esophagus demonstrate no significant findings. Lungs/Pleura: No acute airspace disease, effusion, or pneumothorax. Central airways are patent. Upper Abdomen: No acute abnormality. Musculoskeletal: No acute or destructive bony lesions. Reconstructed images demonstrate no additional findings. IMPRESSION: 1. No acute intrathoracic process. 2. Stable cardiomegaly, with prominent biatrial dilatation. 3.  Aortic Atherosclerosis (ICD10-I70.0). Electronically Signed   By: Randa Ngo M.D.   On: 04/18/2022 10:41   ECHOCARDIOGRAM COMPLETE  Result Date: 04/17/2022    ECHOCARDIOGRAM REPORT   Patient Name:   NIKOLA CAMERON Date of Exam: 04/17/2022 Medical Rec #:  [id number]       Height:       71.0 in Accession #:    [id number]      Weight:       306.4 lb Date of Birth:  March 17, 1955       BSA:          2.528 m Patient Age:    86 years        BP:           103/69 mmHg Patient Gender: M               HR:           84 bpm. Exam Location:  Inpatient Procedure: 2D Echo, Color Doppler, Cardiac Doppler and Intracardiac            Opacification Agent  Indications:    I50.9* Heart failure (unspecified)  History:        Patient has prior history of Echocardiogram examinations, most                 recent 05/07/2017. CAD, Prior CABG; Risk Factors:Hypertension,                 Diabetes, Dyslipidemia and Sleep Apnea.  Sonographer:    Raquel Sarna Senior RDCS Referring Phys: [id number] ALLISON L ELLIS  Sonographer Comments: Very difficult study due to patient body habitus, scanned upright due to dyspnea. IMPRESSIONS  1. Left ventricular ejection fraction, by estimation, is 55 to 60%. The left ventricle has normal function. The left ventricle has no regional wall motion abnormalities. Left ventricular diastolic parameters were normal. There is the interventricular septum is flattened in diastole ('D' shaped left ventricle), consistent with right ventricular volume overload.  2.  Right ventricular systolic function is severely reduced. The right ventricular size is moderately enlarged. There is normal pulmonary artery systolic pressure. The estimated right ventricular systolic pressure is [id number] mmHg, but this may be severely underestimated in the setting of torrential tricuspid regurgitation.  3. Left atrial size was mildly dilated.  4. Right atrial size was severely dilated.  5. The mitral valve is normal in structure. Mild mitral valve regurgitation.  6. 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  7. The aortic valve is tricuspid. There is mild calcification of the aortic valve. Aortic valve regurgitation is not visualized. Aortic valve sclerosis/calcification is present, without any evidence of aortic stenosis. FINDINGS  Left Ventricle: Left ventricular ejection fraction, by estimation, is 55 to 60%. The left ventricle has normal function. The left ventricle has no regional wall motion abnormalities. Definity contrast agent was given IV to delineate the left ventricular  endocardial borders. The left ventricular internal cavity size was normal  in size. There is no left ventricular hypertrophy. The interventricular septum is flattened in diastole ('D' shaped left ventricle), consistent with right ventricular volume overload. Left ventricular diastolic parameters were normal. Normal left ventricular filling pressure. Right Ventricle: The right ventricular size is moderately enlarged. Right vetricular wall thickness was not well visualized. Right ventricular systolic function is severely reduced. There is normal pulmonary artery systolic pressure. The tricuspid regurgitant velocity is 1.67 m/s, and with an assumed right atrial pressure of 15 mmHg, the estimated right ventricular systolic pressure is [id number] mmHg. Left Atrium: Left atrial size was mildly dilated. Right Atrium: Right atrial size was severely dilated. Pericardium: There is no evidence of pericardial effusion. Mitral Valve: The mitral valve is normal in structure. Mild mitral valve regurgitation, with eccentric posteriorly directed jet. Tricuspid Valve: There appears to be severe leaflet malcoaptation. The tricuspid valve is not well visualized. Tricuspid valve regurgitation is severe. Aortic Valve: The aortic valve is tricuspid. There is mild calcification of the aortic valve. Aortic valve regurgitation is not visualized. Aortic valve sclerosis/calcification is present, without any evidence of aortic stenosis. Pulmonic Valve: The pulmonic valve was normal in structure. Pulmonic valve regurgitation is trivial. Aorta: The aortic root and ascending aorta are structurally normal, with no evidence of dilitation. Venous: The inferior vena cava and the hepatic vein show a pattern of systolic flow reversal, suggestive of tricuspid regurgitation. IAS/Shunts: No atrial level shunt detected by color flow Doppler.  LEFT VENTRICLE PLAX 2D LVIDd:         4.40 cm   Diastology LVIDs:         3.10 cm   LV e' medial:    6.64 cm/s LV PW:         0.90 cm   LV E/e' medial:  8.0 LV IVS:        0.90 cm   LV e' lateral:    11.53 cm/s LVOT diam:     2.00 cm   LV E/e' lateral: 4.6 LV SV:         31 LV SV Index:   12 LVOT Area:     3.14 cm  RIGHT VENTRICLE RV S prime:     4.90 cm/s TAPSE (M-mode): 1.1 cm LEFT ATRIUM             Index        RIGHT ATRIUM           Index LA diam:        4.40 cm 1.74 cm/m  RA Area:     38.40 cm LA Vol (A2C):   44.8 ml 17.72 ml/m  RA Volume:   171.00 ml 67.65 ml/m LA Vol (A4C):   52.5 ml 20.77 ml/m LA Biplane Vol: 52.8 ml 20.89 ml/m  AORTIC VALVE LVOT Vmax:   64.63 cm/s LVOT Vmean:  47.333 cm/s LVOT VTI:    0.097 m  AORTA Ao Root diam: 2.80 cm Ao Asc diam:  3.50 cm MV E velocity: 53.05 cm/s  TRICUSPID VALVE MV A velocity: 37.26 cm/s  TR Peak grad:   11.2 mmHg MV E/A ratio:  1.42        TR Vmax:        167.00 cm/s                             SHUNTS                            Systemic VTI:  0.10 m                            Systemic Diam: 2.00 cm Mihai Croitoru MD Electronically signed by Sanda Klein MD Signature Date/Time: 04/17/2022/4:11:22 PM    Final     Cardiac Studies   See echo above  Assessment   Principal Problem:   Acute respiratory failure with hypoxia (HCC) Active Problems:   Bacteremia   Severe tricuspid regurgitation   Pulmonary embolism (HCC)   RVF (right ventricular failure) (HCC)   Plan   Remains short of breath - found to have segmental PE with what appears to be right heart strain on echo and severe RV dysfunction with torrential TR and leaflet malcoaptation.  This is all likely related to PE burden, but could be acute on chronic right heart failure. Concerned about significantly worse renal function yesterday - awaiting labs, off diuresis. Low suspicion for endocarditis, but could pursue TEE next week if respiratory status improves.  Time Spent Directly with Patient:  I have spent a total of 25 minutes with the patient reviewing hospital notes, telemetry, EKGs, labs and examining the patient as well as establishing an assessment and plan that was discussed  personally with the patient.  > 50% of time was spent in direct patient care.  Length of Stay:  LOS: 1 day   Pixie Casino, MD, [facility], [place] Director of the [facility] Diplomate of the American Board of Clinical Lipidology Attending Cardiologist  Direct Dial: [phone]  Fax: [phone]  Website:  [email or web address] 04/19/2022, 8:35 AM

## 2022-04-19 NOTE — Progress Notes (Addendum)
[place] for heparin Indication: pulmonary embolus Brief A/P: Heparin level within goal range Continue Heparin at current rate   Allergies  Allergen Reactions   Empagliflozin Rash    Patient reports yeast.    Ibuprofen Other (See Comments)    Stomach pain    Patient Measurements: Height: '5\' 11"'$  (180.3 cm) Weight: (!) 140.3 kg (309 lb 4.9 oz) IBW/kg (Calculated) : 75.3 Heparin Dosing Weight: 107kg  Vital Signs: Temp: 97.5 F (36.4 C) (03/09 1140) Temp Source: Oral (03/09 1140) BP: 113/78 (03/09 1140) Pulse Rate: 71 (03/09 1140)  Labs: Recent Labs    04/17/22 0045 04/17/22 0240 04/17/22 0830 04/18/22 0050 04/19/22 0123 04/19/22 0124 04/19/22 0125 04/19/22 1040  HGB 14.0  --  14.4 14.0  --   --  12.9*  --   HCT 41.6  --  43.9 40.5  --   --  37.5*  --   PLT 189  --  185 155  --   --  148*  --   HEPARINUNFRC  --   --   --   --  0.42  --   --  0.21*  CREATININE 1.62*  --  1.47* 2.95*  --  2.36*  --   --   TROPONINIHS 11 11  --   --   --   --   --   --      Estimated Creatinine Clearance: 42.9 mL/min (A) (by C-G formula based on SCr of 2.36 mg/dL (H)).  Assessment: 67 y.o. male with GAS bacteremia/PNA. V/Q showed PE on 3/8. Pharmacy consulted for heparin dosing.   Heparin level today is subtherapeutic at 0.21, on 1700 units/hr. Hgb 12.9, plt 148--stable. No line issues or signs/symptoms of bleeding per nursing.  Goal of Therapy:  Heparin level 0.3-0.7 units/ml Monitor platelets by anticoagulation protocol: Yes   Plan:  Increase heparin rate to 1900 units/hr  F/u 8hr heparin level  Monitor CBC, heparin level, and s/sx of bleeding daily    Billey Gosling, PharmD PGY1 Pharmacy Resident 3/9/202412:01 PM

## 2022-04-19 NOTE — Progress Notes (Signed)
[place] for heparin Indication: pulmonary embolus Brief A/P: Heparin level within goal range Continue Heparin at current rate   Allergies  Allergen Reactions   Empagliflozin Rash    Patient reports yeast.    Ibuprofen Other (See Comments)    Stomach pain    Patient Measurements: Height: '5\' 11"'$  (180.3 cm) Weight: (!) 140.3 kg (309 lb 4.9 oz) IBW/kg (Calculated) : 75.3 Heparin Dosing Weight: 107kg  Vital Signs: Temp: 98.3 F (36.8 C) (03/09 0018) Temp Source: Oral (03/09 0018) BP: 107/72 (03/09 0018) Pulse Rate: 76 (03/09 0018)  Labs: Recent Labs    04/17/22 0045 04/17/22 0240 04/17/22 0830 04/18/22 0050 04/19/22 0123 04/19/22 0125  HGB 14.0  --  14.4 14.0  --  12.9*  HCT 41.6  --  43.9 40.5  --  37.5*  PLT 189  --  185 155  --  148*  HEPARINUNFRC  --   --   --   --  0.42  --   CREATININE 1.62*  --  1.47* 2.95*  --   --   TROPONINIHS 11 11  --   --   --   --      Estimated Creatinine Clearance: 34.3 mL/min (A) (by C-G formula based on SCr of 2.95 mg/dL (H)).  Assessment: 67 y.o. male with PE for heparin  Goal of Therapy:  Heparin level 0.3-0.7 units/ml Monitor platelets by anticoagulation protocol: Yes   Plan:  Continue Heparin at current rate  Check heparin level in 6 hours to verify  Phillis Knack, PharmD, BCPS  04/19/2022 2:11 AM

## 2022-04-19 NOTE — Progress Notes (Signed)
PROGRESS NOTE    [name] [name]  [id number] DOB: April 21, 1955 DOA: 04/16/2022 PCP: [name], MD  Chief Complaint  Patient presents with   Back Pain    Brief Narrative:   67 y.o. male with medical history significant of coronary artery disease status post CABG in [id number], diastolic dysfunction, cirrhosis of the liver, hypertension, ectopic atrial rhythm, dyslipidemia, CKD 3A, diabetes mellitus 2, sleep apnea and associated right bundle branch block, obesity, peripheral neuropathy in context of diabetes as well as varicose veins admitted initially with concern for heart failure, now found to have group [name] [name] strep bacteremia.  Assessment & Plan:   Principal Problem:   Acute respiratory failure with hypoxia (HCC) Active Problems:   Bacteremia   Severe tricuspid regurgitation   Pulmonary embolism (HCC)   RVF (right ventricular failure) (HCC)  Sepsis Group [name] [name] Strep Bacteremia CT abd/pelvis without acute findings.  CT L spine without bony abnormality. CXR with mild CHF CT chest without acute intrathoracic process MR spine without evidence of discitis/osteo  Complains of dental pain, will follow CT max face 2/2 blood cultures with strep bacteremia Will repeat cultures  Continue penicillin per infectious disease Appreciate ID assistance - recommending TEE  Acute Pulmonary Embolism  Right Sided Heart Failure ? Hypercoagulable in setting of recent covid infection Echo with IV septum flattened in diastole c/w RV volume overload, RVSF severely reduced, RV size moderately enlarged, torrential tricuspid regurgitation VQ scan suggestive of PE Will follow LE [place] - negative for DVT Heparin gtt  Acute Hypoxic Respiratory Failure Acute on Chronic Diastolic Heart Failure CXR at presentation with mild CHF Hold further diuresis with worsening creatinine after diuresis 3/7 (also with suspicion of sepsis, infection above) Strict I/O, daily weights Appreciate cardiology  recommendations  Acute Kidney Injury In setting of sepsis and diuresis Hold further diuretics at this time.  Hold prior to admission losartan and eplerenone.  Hypertension Hold losartan and eplerenone  Low dose metop with holding parameters with sepsis/soft BP's recently  CAD  hx CABG Reduced dose beta blocker with sepsis Statin  Bell's Palsy Right sided facial droop without forehead sparing MRI brain 3/2 without acut intracranial pathology Continue steroids, ophthalmic ointment  T2DM Continue home insulin, add mealtime and SSI Follow A1c 9.7  OSA Cpap  Recent COVID 19 infection    DVT prophylaxis: lovenox Code Status: full Family Communication: none Disposition:   Status is: Observation The patient will require care spanning > 2 midnights and should be moved to inpatient because: continued ID and cardiology w/u   Consultants:  ID cardiology  Procedures:    Antimicrobials:  Anti-infectives (From admission, onward)    Start     Dose/Rate Route Frequency Ordered Stop   04/18/22 1000  cefTRIAXone (ROCEPHIN) 2 g in sodium chloride 0.9 % 100 mL IVPB  Status:  Discontinued        2 g 200 mL/hr over 30 Minutes Intravenous Every 24 hours 04/17/22 2153 04/17/22 2217   04/17/22 2315  penicillin G potassium 12 Million Units in dextrose 5 % 500 mL CONTINUOUS infusion        12 Million Units 41.7 mL/hr over 12 Hours Intravenous Every 12 hours 04/17/22 2217     04/17/22 2315  linezolid (ZYVOX) IVPB 600 mg  Status:  Discontinued        600 mg 300 mL/hr over 60 Minutes Intravenous Every 12 hours 04/17/22 2217 04/18/22 1043   04/17/22 1000  cefTRIAXone (ROCEPHIN) 1 g in sodium chloride 0.9 % 100  mL IVPB  Status:  Discontinued        1 g 200 mL/hr over 30 Minutes Intravenous Every 24 hours 04/17/22 0919 04/17/22 2153   04/17/22 1000  azithromycin (ZITHROMAX) 500 mg in sodium chloride 0.9 % 250 mL IVPB  Status:  Discontinued        500 mg 250 mL/hr over 60 Minutes  Intravenous Every 24 hours 04/17/22 0919 04/17/22 2217       Subjective: C/o tooth pain, left upper  Objective: Vitals:   04/19/22 0555 04/19/22 0855 04/19/22 1040 04/19/22 1140  BP: 110/75  104/77 113/78  Pulse: 71 80 75 71  Resp: 16   15  Temp: 98 F (36.7 C) 98 F (36.7 C)  (!) 97.5 F (36.4 C)  TempSrc: Oral Oral  Oral  SpO2: 97% 93%  91%  Weight:      Height:        Intake/Output Summary (Last 24 hours) at 04/19/2022 1555 Last data filed at 04/19/2022 1100 Gross per 24 hour  Intake 1752.15 ml  Output 1300 ml  Net 452.15 ml   Filed Weights   04/17/22 1544 04/18/22 0025 04/19/22 0018  Weight: (!) 139.5 kg (!) 140.6 kg (!) 140.3 kg    Examination:  General: No acute distress. Edentulous, I'm unable to see teeth that are causing him issue  Cardiovascular: RRR Lungs: unlabored Abdomen: Soft, protuberant Neurological: R sided facial droop  Extremities: No clubbing or cyanosis. No edema.    Data Reviewed: I have personally reviewed following labs and imaging studies  CBC: Recent Labs  Lab 04/15/22 1155 04/17/22 0045 04/17/22 0830 04/18/22 0050 04/19/22 0125  WBC 6.7 10.6* 13.0* 17.5* 14.3*  HGB 14.1 14.0 14.4 14.0 12.9*  HCT 41.1 41.6 43.9 40.5 37.5*  MCV 103.2* 103.0* 104.0* 102.3* 100.3*  PLT 212.0 189 185 155 148*    Basic Metabolic Panel: Recent Labs  Lab 04/15/22 1155 04/17/22 0045 04/17/22 0830 04/18/22 0050 04/19/22 0124  NA 137 137  --  133* 129*  K 3.7 3.8  --  4.5 4.3  CL 100 100  --  98 97*  CO2 27 23  --  20* 20*  GLUCOSE 220* 217*  --  208* 262*  BUN 26* 24*  --  34* 42*  CREATININE 1.58* 1.62* 1.47* 2.95* 2.36*  CALCIUM 9.0 9.2  --  8.4* 8.3*    GFR: Estimated Creatinine Clearance: 42.9 mL/min ([name] [name]) (by C-G formula based on SCr of 2.36 mg/dL (H)).  Liver Function Tests: Recent Labs  Lab 04/15/22 1155 04/17/22 0045 04/18/22 0050  AST 22 30 75*  ALT '12 19 28  '$ ALKPHOS 116 104 99  BILITOT 1.0 0.9 3.2*  PROT 7.8 8.2* 7.9   ALBUMIN 3.4* 3.1* 2.8*    CBG: Recent Labs  Lab 04/18/22 1626 04/18/22 2102 04/19/22 0622 04/19/22 0819 04/19/22 1154  GLUCAP 248* 271* 265* 273* 301*     Recent Results (from the past 240 hour(s))  Resp panel by RT-PCR (RSV, Flu [name] [name]&B, Covid) Urine, Clean Catch     Status: None   Collection Time: 04/17/22  6:14 AM   Specimen: Urine, Clean Catch; Nasal Swab  Result Value Ref Range Status   SARS Coronavirus 2 by RT PCR NEGATIVE NEGATIVE Final   Influenza Dawud Mays by PCR NEGATIVE NEGATIVE Final   Influenza B by PCR NEGATIVE NEGATIVE Final    Comment: (NOTE) The Xpert Xpress SARS-CoV-2/FLU/RSV plus assay is intended as an aid in the diagnosis of influenza from Nasopharyngeal  swab specimens and should not be used as [place] sole basis for treatment. Nasal washings and aspirates are unacceptable for Xpert Xpress SARS-CoV-2/FLU/RSV testing.  Fact Sheet for Patients: EntrepreneurPulse.com.au  Fact Sheet for Healthcare Providers: IncredibleEmployment.be  This test is not yet approved or cleared by the [place] FDA and has been authorized for detection and/or diagnosis of SARS-CoV-2 by FDA under an Emergency Use Authorization (EUA). This EUA will remain in effect (meaning this test can be used) for the duration of the COVID-19 declaration under Section 564(b)(1) of the Act, 21 U.S.C. section 360bbb-3(b)(1), unless the authorization is terminated or revoked.     Resp Syncytial Virus by PCR NEGATIVE NEGATIVE Final    Comment: (NOTE) Fact Sheet for Patients: EntrepreneurPulse.com.au  Fact Sheet for Healthcare Providers: IncredibleEmployment.be  This test is not yet approved or cleared by the [place] FDA and has been authorized for detection and/or diagnosis of SARS-CoV-2 by FDA under an Emergency Use Authorization (EUA). This EUA will remain in effect (meaning this test can be used) for the duration of  the COVID-19 declaration under Section 564(b)(1) of the Act, 21 U.S.C. section 360bbb-3(b)(1), unless the authorization is terminated or revoked.  Performed at [facility], [place] [address] [place], [place] [address]   Respiratory (~20 pathogens) panel by PCR     Status: None   Collection Time: 04/17/22  7:19 AM   Specimen: Nasopharyngeal Swab; Respiratory  Result Value Ref Range Status   Adenovirus NOT DETECTED NOT DETECTED Final   Coronavirus 229E NOT DETECTED NOT DETECTED Final    Comment: (NOTE) The Coronavirus on the Respiratory Panel, DOES NOT test for the novel  Coronavirus (2019 nCoV)    Coronavirus HKU1 NOT DETECTED NOT DETECTED Final   Coronavirus NL63 NOT DETECTED NOT DETECTED Final   Coronavirus OC43 NOT DETECTED NOT DETECTED Final   Metapneumovirus NOT DETECTED NOT DETECTED Final   Rhinovirus / Enterovirus NOT DETECTED NOT DETECTED Final   Influenza Katilyn Miltenberger NOT DETECTED NOT DETECTED Final   Influenza B NOT DETECTED NOT DETECTED Final   Parainfluenza Virus 1 NOT DETECTED NOT DETECTED Final   Parainfluenza Virus 2 NOT DETECTED NOT DETECTED Final   Parainfluenza Virus 3 NOT DETECTED NOT DETECTED Final   Parainfluenza Virus 4 NOT DETECTED NOT DETECTED Final   Respiratory Syncytial Virus NOT DETECTED NOT DETECTED Final   Bordetella pertussis NOT DETECTED NOT DETECTED Final   Bordetella Parapertussis NOT DETECTED NOT DETECTED Final   Chlamydophila pneumoniae NOT DETECTED NOT DETECTED Final   Mycoplasma pneumoniae NOT DETECTED NOT DETECTED Final    Comment: Performed at [facility], [place]. [address] [place], [place] [address]  Culture, blood (Routine X 2) w Reflex to ID Panel     Status: Abnormal (Preliminary result)   Collection Time: 04/17/22  8:23 AM   Specimen: BLOOD  Result Value Ref Range Status   Specimen Description BLOOD RIGHT ANTECUBITAL  Final   Special Requests   Final    BOTTLES DRAWN AEROBIC AND ANAEROBIC Blood Culture results may not be optimal due  to an inadequate volume of blood received in culture bottles   Culture  Setup Time   Final    GRAM POSITIVE COCCI IN CHAINS IN BOTH AEROBIC AND ANAEROBIC BOTTLES CRITICAL RESULT CALLED TO, READ BACK BY AND VERIFIED WITH: PHARMD LISA Privateer ON 04/17/22 @ 2151 BY DRT    Culture (Revel Stellmach)  Final    STREPTOCOCCUS PYOGENES HEALTH DEPARTMENT NOTIFIED Performed at [facility], [address] [place], [place] [address]    Report Status PENDING  Incomplete   Organism ID, Bacteria STREPTOCOCCUS PYOGENES  Final      Susceptibility   Streptococcus pyogenes - MIC*    PENICILLIN <=0.06 SENSITIVE Sensitive     CEFTRIAXONE <=0.12 SENSITIVE Sensitive     ERYTHROMYCIN <=0.12 SENSITIVE Sensitive     LEVOFLOXACIN 0.5 SENSITIVE Sensitive     VANCOMYCIN 0.5 SENSITIVE Sensitive     * STREPTOCOCCUS PYOGENES  Blood Culture ID Panel (Reflexed)     Status: Abnormal   Collection Time: 04/17/22  8:23 AM  Result Value Ref Range Status   Enterococcus faecalis NOT DETECTED NOT DETECTED Final   Enterococcus Faecium NOT DETECTED NOT DETECTED Final   Listeria monocytogenes NOT DETECTED NOT DETECTED Final   Staphylococcus species NOT DETECTED NOT DETECTED Final   Staphylococcus aureus (BCID) NOT DETECTED NOT DETECTED Final   Staphylococcus epidermidis NOT DETECTED NOT DETECTED Final   Staphylococcus lugdunensis NOT DETECTED NOT DETECTED Final   Streptococcus species DETECTED (Birney Belshe) NOT DETECTED Final    Comment: CRITICAL RESULT CALLED TO, READ BACK BY AND VERIFIED WITH: PHARMD LISA CURRAN ON 04/17/22 @ 2151 BY DRT    Streptococcus agalactiae NOT DETECTED NOT DETECTED Final   Streptococcus pneumoniae NOT DETECTED NOT DETECTED Final   Streptococcus pyogenes DETECTED (Giulia Hickey) NOT DETECTED Final    Comment: CRITICAL RESULT CALLED TO, READ BACK BY AND VERIFIED WITH: PHARMD LISA CURRAN ON 04/17/22 @ 2151 BY DRT    Teretha Chalupa.calcoaceticus-baumannii NOT DETECTED NOT DETECTED Final   Bacteroides fragilis NOT DETECTED NOT DETECTED Final    Enterobacterales NOT DETECTED NOT DETECTED Final   Enterobacter cloacae complex NOT DETECTED NOT DETECTED Final   Escherichia coli NOT DETECTED NOT DETECTED Final   Klebsiella aerogenes NOT DETECTED NOT DETECTED Final   Klebsiella oxytoca NOT DETECTED NOT DETECTED Final   Klebsiella pneumoniae NOT DETECTED NOT DETECTED Final   Proteus species NOT DETECTED NOT DETECTED Final   Salmonella species NOT DETECTED NOT DETECTED Final   Serratia marcescens NOT DETECTED NOT DETECTED Final   Haemophilus influenzae NOT DETECTED NOT DETECTED Final   Neisseria meningitidis NOT DETECTED NOT DETECTED Final   Pseudomonas aeruginosa NOT DETECTED NOT DETECTED Final   Stenotrophomonas maltophilia NOT DETECTED NOT DETECTED Final   Candida albicans NOT DETECTED NOT DETECTED Final   Candida auris NOT DETECTED NOT DETECTED Final   Candida glabrata NOT DETECTED NOT DETECTED Final   Candida krusei NOT DETECTED NOT DETECTED Final   Candida parapsilosis NOT DETECTED NOT DETECTED Final   Candida tropicalis NOT DETECTED NOT DETECTED Final   Cryptococcus neoformans/gattii NOT DETECTED NOT DETECTED Final    Comment: Performed at [facility], [place]. [address] [place], [place] [address]  Culture, blood (Routine X 2) w Reflex to ID Panel     Status: Abnormal (Preliminary result)   Collection Time: 04/17/22  8:30 AM   Specimen: BLOOD LEFT FOREARM  Result Value Ref Range Status   Specimen Description BLOOD LEFT FOREARM  Final   Special Requests   Final    BOTTLES DRAWN AEROBIC AND ANAEROBIC Blood Culture adequate volume   Culture  Setup Time   Final    GRAM POSITIVE COCCI IN CHAINS IN BOTH AEROBIC AND ANAEROBIC BOTTLES CRITICAL VALUE NOTED.  VALUE IS CONSISTENT WITH PREVIOUSLY REPORTED AND CALLED VALUE.    Culture (Zandra Lajeunesse)  Final    STREPTOCOCCUS PYOGENES SUSCEPTIBILITIES PERFORMED ON PREVIOUS CULTURE WITHIN THE LAST 5 DAYS. Performed at [facility], [address], [place], [place] [address]    Report  Status PENDING  Incomplete  Culture, blood (Routine X 2) w Reflex to ID Panel     Status: None (Preliminary result)   Collection Time: 04/19/22  1:23 AM   Specimen: BLOOD LEFT HAND  Result Value Ref Range Status   Specimen Description BLOOD LEFT HAND  Final   Special Requests   Final    BOTTLES DRAWN AEROBIC ONLY Blood Culture results may not be optimal due to an inadequate volume of blood received in culture bottles   Culture   Final    NO GROWTH < 12 HOURS Performed at [facility], [place] [address], [place], [place] [address]    Report Status PENDING  Incomplete  Culture, blood (Routine X 2) w Reflex to ID Panel     Status: None (Preliminary result)   Collection Time: 04/19/22  1:25 AM   Specimen: BLOOD RIGHT HAND  Result Value Ref Range Status   Specimen Description BLOOD RIGHT HAND  Final   Special Requests   Final    BOTTLES DRAWN AEROBIC AND ANAEROBIC Blood Culture adequate volume   Culture   Final    NO GROWTH < 12 HOURS Performed at [facility], [place] [address], [place], [place] [address]    Report Status PENDING  Incomplete         Radiology Studies: MR LUMBAR SPINE WO CONTRAST  Result Date: 04/18/2022 CLINICAL DATA:  Low back pain, infection suspected EXAM: MRI LUMBAR SPINE WITHOUT CONTRAST TECHNIQUE: Multiplanar, multisequence MR imaging of the lumbar spine was performed. No intravenous contrast was administered. COMPARISON:  No prior MRI of the lumbar spine, correlation is made with CT lumbar spine 04/17/2022 FINDINGS: Evaluation is somewhat limited by motion artifact. Segmentation:  5 lumbar type vertebral bodies. Alignment: No significant listhesis. Straightening of the normal lumbar lordosis. Vertebrae: No fracture, evidence of discitis, or bone lesion. Congenitally short pedicles, which narrow the AP diameter of the spinal canal. Conus medullaris and cauda equina: Limited evaluation due to motion artifact and thecal sac narrowing, in part due to epidural  lipomatosis. Paraspinal and other soft tissues: Limited by motion. No acute finding. Disc levels: Disc hydration is largely preserved. T12-L1: No significant disc bulge. Moderate thecal sac narrowing, in part secondary to epidural lipomatosis. No neural foraminal narrowing. L1-L2: No significant disc bulge. Mild facet arthropathy. Moderate thecal sac narrowing, in part secondary to epidural lipomatosis. No neural foraminal narrowing. L2-L3: No significant disc bulge. Mild facet arthropathy. Mild to moderate thecal sac narrowing, in part secondary to epidural lipomatosis. No neural foraminal narrowing. L3-L4: Minimal disc bulge. Moderate facet arthropathy. Moderate thecal sac narrowing, in part secondary to epidural lipomatosis. Mild bilateral neural foraminal narrowing. L4-L5: Mild disc bulge. Moderate facet arthropathy. Moderate thecal sac narrowing, in part secondary to epidural lipomatosis. Mild bilateral neural foraminal narrowing. L5-S1: Minimal disc bulge. Mild facet arthropathy. Moderate thecal sac narrowing, secondary to epidural lipomatosis. Mild right neural foraminal narrowing. IMPRESSION: 1. Evaluation is limited by motion artifact. 2. No evidence of discitis-osteomyelitis. No epidural abscess. 3. Congenitally short pedicles, which narrow the AP diameter of the spinal canal, and epidural lipomatosis, which results in moderate thecal sac narrowing at multiple levels. 4. Mild neural foraminal narrowing bilaterally at L3-L4 and L4-L5 and on the right at L5-S1. Electronically Signed   By: Merilyn Baba M.D.   On: 04/18/2022 23:05   NM Pulmonary Perfusion  Result Date: 04/18/2022 CLINICAL DATA:  Hypoxia  EXAM: NUCLEAR MEDICINE PERFUSION LUNG SCAN TECHNIQUE: Perfusion images were obtained in multiple projections after intravenous injection of radiopharmaceutical. Ventilation scans intentionally deferred if perfusion scan and chest x-ray adequate for interpretation during COVID 19 epidemic.  RADIOPHARMACEUTICALS:  4.0 mCi Tc-11mMAA IV COMPARISON:  Portable chest radiograph and CT from the same day FINDINGS: Significant perfusion attenuation involving the entire right upper lobe. Otherwise physiologic distribution of radiopharmaceutical. IMPRESSION: Solitary right upper lobe perfusion defect without radiographic correlate, suggestive of pulmonary embolus. Electronically Signed   By: DLucrezia EuropeM.D.   On: 04/18/2022 14:53   DG CHEST PORT 1 VIEW  Result Date: 04/18/2022 CLINICAL DATA:  Respiratory failure with hypoxia. EXAM: PORTABLE CHEST 1 VIEW COMPARISON:  April 17, 2022. FINDINGS: Stable cardiomegaly with mild central pulmonary vascular congestion. Status post coronary artery bypass graft. Lungs are clear. Bony thorax is unremarkable. IMPRESSION: Stable cardiomegaly with mild central pulmonary vascular congestion. Electronically Signed   By: JMarijo ConceptionM.D.   On: 04/18/2022 12:49   VAS [place]OWER EXTREMITY VENOUS (DVT)  Result Date: 04/18/2022  Lower Venous DVT Study Patient Name:  JLAN HODGSON Date of Exam:   04/18/2022 Medical Rec #: [id number]       Accession #:    [id number] of Birth: [birth year]       Patient Gender: M Patient Age:   89years [facility]      VAS [place]OWER EXTREMITY VENOUS (DVT) Referring Phys: Leanne Sisler POWELL JR --------------------------------------------------------------------------------  Indications: Edema.  Comparison Study: 11-07-2020 Prior right lower extremity venous study was                   negative for DVT. Performing Technologist: RDarlin CocoRDMS, RVT  Examination Guidelines: Evangelina Delancey complete evaluation includes B-mode imaging, spectral Doppler, color Doppler, and power Doppler as needed of all accessible portions of each vessel. Bilateral testing is considered an integral part of [place] complete examination. Limited examinations for reoccurring indications may be performed as noted. The reflux portion of the exam is performed with  the patient in reverse Trendelenburg.  +---------+---------------+---------+-----------+----------+--------------+ RIGHT    CompressibilityPhasicitySpontaneityPropertiesThrombus Aging +---------+---------------+---------+-----------+----------+--------------+ CFV      Full           Yes      Yes                                 +---------+---------------+---------+-----------+----------+--------------+ SFJ      Full                                                        +---------+---------------+---------+-----------+----------+--------------+ FV Prox  Full                                                        +---------+---------------+---------+-----------+----------+--------------+ FV Mid   Full                                                        +---------+---------------+---------+-----------+----------+--------------+  FV DistalFull                                                        +---------+---------------+---------+-----------+----------+--------------+ PFV      Full                                                        +---------+---------------+---------+-----------+----------+--------------+ POP      Full           Yes      Yes                                 +---------+---------------+---------+-----------+----------+--------------+ PTV      Full                                                        +---------+---------------+---------+-----------+----------+--------------+ PERO     Full                                                        +---------+---------------+---------+-----------+----------+--------------+   +---------+---------------+---------+-----------+----------+--------------+ LEFT     CompressibilityPhasicitySpontaneityPropertiesThrombus Aging +---------+---------------+---------+-----------+----------+--------------+ CFV      Full           Yes      Yes                                  +---------+---------------+---------+-----------+----------+--------------+ SFJ      Full                                                        +---------+---------------+---------+-----------+----------+--------------+ FV Prox  Full                                                        +---------+---------------+---------+-----------+----------+--------------+ FV Mid   Full                                                        +---------+---------------+---------+-----------+----------+--------------+ FV DistalFull                                                        +---------+---------------+---------+-----------+----------+--------------+  PFV      Full                                                        +---------+---------------+---------+-----------+----------+--------------+ POP      Full           Yes      Yes                                 +---------+---------------+---------+-----------+----------+--------------+ PTV      Full                                                        +---------+---------------+---------+-----------+----------+--------------+ PERO     Full                                                        +---------+---------------+---------+-----------+----------+--------------+     Summary: RIGHT: - There is no evidence of deep vein thrombosis in the lower extremity.  - No cystic structure found in the popliteal fossa.  LEFT: - There is no evidence of deep vein thrombosis in the lower extremity.  - No cystic structure found in the popliteal fossa.  *See table(s) above for measurements and observations.    Preliminary    CT CHEST WO CONTRAST  Result Date: 04/18/2022 CLINICAL DATA:  Pneumonia, complication suspected, xray done EXAM: CT CHEST WITHOUT CONTRAST TECHNIQUE: Multidetector CT imaging of the chest was performed following the standard protocol without IV contrast. RADIATION DOSE REDUCTION: This exam was performed  according to the departmental dose-optimization program which includes automated exposure control, adjustment of the mA and/or kV according to patient size and/or use of iterative reconstruction technique. COMPARISON:  04/17/2022 FINDINGS: Cardiovascular: Unenhanced imaging of the heart demonstrates moderate cardiomegaly, with prominent dilatation of the atria. No pericardial effusion. Postsurgical changes from median sternotomy and CABG. Diffuse atherosclerosis throughout the native coronary vessels. Normal caliber of the thoracic aorta. Atherosclerosis of the aortic arch. Evaluation of the vascular lumen is limited without IV contrast. Mediastinum/Nodes: No enlarged mediastinal or axillary lymph nodes. Thyroid gland, trachea, and esophagus demonstrate no significant findings. Lungs/Pleura: No acute airspace disease, effusion, or pneumothorax. Central airways are patent. Upper Abdomen: No acute abnormality. Musculoskeletal: No acute or destructive bony lesions. Reconstructed images demonstrate no additional findings. IMPRESSION: 1. No acute intrathoracic process. 2. Stable cardiomegaly, with prominent biatrial dilatation. 3.  Aortic Atherosclerosis (ICD10-I70.0). Electronically Signed   By: Randa Ngo M.D.   On: 04/18/2022 10:41        Scheduled Meds:  artificial tears   Right Eye Q4H   aspirin EC  81 mg Oral Daily   famotidine  20 mg Oral Daily   insulin aspart  0-20 Units Subcutaneous TID WC   insulin aspart  0-5 Units Subcutaneous QHS   insulin aspart  8 Units Subcutaneous TID WC   insulin glargine-yfgn  50 Units Subcutaneous Daily   metoprolol tartrate  12.5  mg Oral BID   pantoprazole  40 mg Oral Daily   polyethylene glycol  17 g Oral Daily   predniSONE  40 mg Oral Q breakfast   rosuvastatin  20 mg Oral QPM   sodium chloride flush  3 mL Intravenous Q12H   Continuous Infusions:  heparin 1,900 Units/hr (04/19/22 1328)   penicillin G potassium 12 Million Units in dextrose 5 % 500 mL  CONTINUOUS infusion 12 Million Units (04/19/22 1348)     LOS: 1 day    Time spent: over 30 min    Fayrene Helper, MD [facility]   To contact the attending provider between 7A-7P or the covering provider during after hours 7P-7A, please log into the web site [email or web address] and access using universal [place] password for that web site. If you do not have the password, please call the hospital operator.  04/19/2022, 3:55 PM

## 2022-04-20 ENCOUNTER — Inpatient Hospital Stay (HOSPITAL_COMMUNITY): Payer: 59

## 2022-04-20 DIAGNOSIS — I2609 Other pulmonary embolism with acute cor pulmonale: Secondary | ICD-10-CM

## 2022-04-20 DIAGNOSIS — J9601 Acute respiratory failure with hypoxia: Secondary | ICD-10-CM | POA: Diagnosis not present

## 2022-04-20 LAB — COMPREHENSIVE METABOLIC PANEL
ALT: 49 U/L — ABNORMAL HIGH (ref 0–44)
AST: 63 U/L — ABNORMAL HIGH (ref 15–41)
Albumin: 2.4 g/dL — ABNORMAL LOW (ref 3.5–5.0)
Alkaline Phosphatase: 104 U/L (ref 38–126)
Anion gap: 10 (ref 5–15)
BUN: 37 mg/dL — ABNORMAL HIGH (ref 8–23)
CO2: 22 mmol/L (ref 22–32)
Calcium: 8.7 mg/dL — ABNORMAL LOW (ref 8.9–10.3)
Chloride: 98 mmol/L (ref 98–111)
Creatinine, Ser: 1.74 mg/dL — ABNORMAL HIGH (ref 0.61–1.24)
GFR, Estimated: 42 mL/min — ABNORMAL LOW (ref >60)
Glucose, Bld: 223 mg/dL — ABNORMAL HIGH (ref 70–99)
Potassium: 3.9 mmol/L (ref 3.5–5.1)
Sodium: 130 mmol/L — ABNORMAL LOW (ref 135–145)
Total Bilirubin: 1.9 mg/dL — ABNORMAL HIGH (ref 0.3–1.2)
Total Protein: 7.4 g/dL (ref 6.5–8.1)

## 2022-04-20 LAB — CBC WITH DIFFERENTIAL/PLATELET
Abs Immature Granulocytes: 0.17 10*3/uL — ABNORMAL HIGH (ref 0.00–0.07)
Basophils Absolute: 0 10*3/uL (ref 0.0–0.1)
Basophils Relative: 0 %
Eosinophils Absolute: 0 10*3/uL (ref 0.0–0.5)
Eosinophils Relative: 0 %
HCT: 39.1 % (ref 39.0–52.0)
Hemoglobin: 13.7 g/dL (ref 13.0–17.0)
Immature Granulocytes: 1 %
Lymphocytes Relative: 16 %
Lymphs Abs: 2.2 10*3/uL (ref 0.7–4.0)
MCH: 34.8 pg — ABNORMAL HIGH (ref 26.0–34.0)
MCHC: 35 g/dL (ref 30.0–36.0)
MCV: 99.2 fL (ref 80.0–100.0)
Monocytes Absolute: 1.5 10*3/uL — ABNORMAL HIGH (ref 0.1–1.0)
Monocytes Relative: 11 %
Neutro Abs: 10 10*3/uL — ABNORMAL HIGH (ref 1.7–7.7)
Neutrophils Relative %: 72 %
Platelets: 145 10*3/uL — ABNORMAL LOW (ref 150–400)
RBC: 3.94 MIL/uL — ABNORMAL LOW (ref 4.22–5.81)
RDW: 14.7 % (ref 11.5–15.5)
WBC: 13.8 10*3/uL — ABNORMAL HIGH (ref 4.0–10.5)
nRBC: 0.5 % — ABNORMAL HIGH (ref 0.0–0.2)

## 2022-04-20 LAB — GLUCOSE, CAPILLARY
Glucose-Capillary: 231 mg/dL — ABNORMAL HIGH (ref 70–99)
Glucose-Capillary: 213 mg/dL — ABNORMAL HIGH (ref 70–99)
Glucose-Capillary: 235 mg/dL — ABNORMAL HIGH (ref 70–99)
Glucose-Capillary: 196 mg/dL — ABNORMAL HIGH (ref 70–99)

## 2022-04-20 LAB — PHOSPHORUS: Phosphorus: 2.5 mg/dL (ref 2.5–4.6)

## 2022-04-20 LAB — HEPARIN LEVEL (UNFRACTIONATED)
Heparin Unfractionated: 0.33 IU/mL (ref 0.30–0.70)
Heparin Unfractionated: 0.58 IU/mL (ref 0.30–0.70)

## 2022-04-20 LAB — MAGNESIUM: Magnesium: 2.1 mg/dL (ref 1.7–2.4)

## 2022-04-20 MED ORDER — IOHEXOL 12 MG/ML PO SOLN: 500.0000 mL | ORAL | Status: AC

## 2022-04-20 MED ORDER — IOHEXOL 9 MG/ML PO SOLN
500.0000 mL | ORAL | Status: AC
  Administered 2022-04-20: 500 mL via ORAL

## 2022-04-20 NOTE — Progress Notes (Signed)
DAILY PROGRESS NOTE   Patient Name: [name] [name] Date of Encounter: 04/20/2022 Cardiologist: Candee Furbish, MD  Chief Complaint   Short of breath  Patient Profile   [name] [name] is a 67 y.o. male with a hx of CAD s/p CABG, chronic diastolic heart failure, hypertension, liver cirrhosis, hypokalemia, OSA, RBBB, history of ectopic atrial rhythm, CKD, hyperlipidemia, morbid obesity, DM2 , PVD, who is being seen 04/18/2022 for the evaluation of right heart failure at the request of Dr. Florene Glen.   Subjective   Creatinine improving off diuretics. Vitals stable - on heparin for PE. Possible TEE next week.  Objective   Vitals:   04/19/22 2055 04/19/22 2355 04/20/22 0105 04/20/22 0413  BP:  115/74 (!) 110/6 106/65  Pulse: 74 64 (!) 56 60  Resp:  '18 19 15  '$ Temp:  99 F (37.2 C) 99 F (37.2 C) 97.7 F (36.5 C)  TempSrc:  Axillary Oral Oral  SpO2:  97% 96% 95%  Weight:   (!) 140.3 kg   Height:        Intake/Output Summary (Last 24 hours) at 04/20/2022 1123 Last data filed at 04/20/2022 0600 Gross per 24 hour  Intake 1758.72 ml  Output 3175 ml  Net -1416.28 ml   Filed Weights   04/18/22 0025 04/19/22 0018 04/20/22 0105  Weight: (!) 140.6 kg (!) 140.3 kg (!) 140.3 kg    Physical Exam   General appearance: alert, mild distress, and morbidly obese Neck: no carotid bruit, no JVD, and thyroid not enlarged, symmetric, no tenderness/mass/nodules Lungs: diminished breath sounds bilaterally Heart: regular rate and rhythm Abdomen: soft, non-tender; bowel sounds normal; no masses,  no organomegaly and obese Extremities: edema trace Pulses: 2+ and symmetric Skin: Skin color, texture, turgor normal. No rashes or lesions Neurologic: Grossly normal Psych: Slowed mentation  Inpatient Medications    Scheduled Meds:  artificial tears   Right Eye Q4H   aspirin EC  81 mg Oral Daily   famotidine  20 mg Oral Daily   insulin aspart  0-20 Units Subcutaneous TID WC   insulin aspart   0-5 Units Subcutaneous QHS   insulin aspart  8 Units Subcutaneous TID WC   insulin glargine-yfgn  50 Units Subcutaneous Daily   metoprolol tartrate  12.5 mg Oral BID   pantoprazole  40 mg Oral Daily   polyethylene glycol  17 g Oral Daily   rosuvastatin  20 mg Oral QPM   sodium chloride flush  3 mL Intravenous Q12H    Continuous Infusions:  heparin 2,100 Units/hr (04/20/22 0920)   penicillin G potassium 12 Million Units in dextrose 5 % 500 mL CONTINUOUS infusion 41.7 mL/hr at 04/20/22 0600    PRN Meds: acetaminophen **OR** acetaminophen, [COMPLETED] ipratropium-albuterol **FOLLOWED BY** ipratropium-albuterol, ondansetron **OR** ondansetron (ZOFRAN) IV, pregabalin   Labs   Results for orders placed or performed during the hospital encounter of 04/16/22 (from the past 48 hour(s))  Glucose, capillary     Status: Abnormal   Collection Time: 04/18/22 11:25 AM  Result Value Ref Range   Glucose-Capillary 251 (H) 70 - 99 mg/dL    Comment: Glucose reference range applies only to samples taken after fasting for at least 8 hours.  Glucose, capillary     Status: Abnormal   Collection Time: 04/18/22  4:26 PM  Result Value Ref Range   Glucose-Capillary 248 (H) 70 - 99 mg/dL    Comment: Glucose reference range applies only to samples taken after fasting for at least  8 hours.  Glucose, capillary     Status: Abnormal   Collection Time: 04/18/22  9:02 PM  Result Value Ref Range   Glucose-Capillary 271 (H) 70 - 99 mg/dL    Comment: Glucose reference range applies only to samples taken after fasting for at least 8 hours.  Heparin level (unfractionated)     Status: None   Collection Time: 04/19/22  1:23 AM  Result Value Ref Range   Heparin Unfractionated 0.42 0.30 - 0.70 IU/mL    Comment: (NOTE) The clinical reportable range upper limit is being lowered to >1.10 to align with the FDA approved guidance for the current laboratory assay.  If heparin results are below expected values, and patient  dosage has  been confirmed, suggest follow up testing of antithrombin III levels. Performed at [facility], [place] [address], [place], [place] [address]   Culture, blood (Routine X 2) w Reflex to ID Panel     Status: None (Preliminary result)   Collection Time: 04/19/22  1:23 AM   Specimen: BLOOD LEFT HAND  Result Value Ref Range   Specimen Description BLOOD LEFT HAND    Special Requests      BOTTLES DRAWN AEROBIC ONLY Blood Culture results may not be optimal due to an inadequate volume of blood received in culture bottles   Culture      NO GROWTH 1 DAY Performed at [facility], [place] [address], [place], [place] [address]    Report Status PENDING   Basic metabolic panel     Status: Abnormal   Collection Time: 04/19/22  1:24 AM  Result Value Ref Range   Sodium 129 (L) 135 - 145 mmol/L   Potassium 4.3 3.5 - 5.1 mmol/L   Chloride 97 (L) 98 - 111 mmol/L   CO2 20 (L) 22 - 32 mmol/L   Glucose, Bld 262 (H) 70 - 99 mg/dL    Comment: Glucose reference range applies only to samples taken after fasting for at least 8 hours.   BUN 42 (H) 8 - 23 mg/dL   Creatinine, Ser 2.36 (H) 0.61 - 1.24 mg/dL   Calcium 8.3 (L) 8.9 - 10.3 mg/dL   GFR, Estimated 29 (L) >60 mL/min    Comment: (NOTE) Calculated using the CKD-EPI Creatinine Equation (2021)    Anion gap 12 5 - 15    Comment: Performed at [facility] [address], [place], [place] [address]  CBC     Status: Abnormal   Collection Time: 04/19/22  1:25 AM  Result Value Ref Range   WBC 14.3 (H) 4.0 - 10.5 K/uL   RBC 3.74 (L) 4.22 - 5.81 MIL/uL   Hemoglobin 12.9 (L) 13.0 - 17.0 g/dL   HCT 37.5 (L) 39.0 - 52.0 %   MCV 100.3 (H) 80.0 - 100.0 fL   MCH 34.5 (H) 26.0 - 34.0 pg   MCHC 34.4 30.0 - 36.0 g/dL   RDW 15.2 11.5 - 15.5 %   Platelets 148 (L) 150 - 400 K/uL   nRBC 0.0 0.0 - 0.2 %    Comment: Performed at [facility] [address] [place], [place] [address]  Culture, blood (Routine X 2) w Reflex to ID Panel      Status: None (Preliminary result)   Collection Time: 04/19/22  1:25 AM   Specimen: BLOOD RIGHT HAND  Result Value Ref Range   Specimen Description BLOOD RIGHT HAND    Special Requests  BOTTLES DRAWN AEROBIC AND ANAEROBIC Blood Culture adequate volume   Culture      NO GROWTH 1 DAY Performed at [facility], [place] [address] [place] [address], [place], [place] [address]    Report Status PENDING   Glucose, capillary     Status: Abnormal   Collection Time: 04/19/22  6:22 AM  Result Value Ref Range   Glucose-Capillary 265 (H) 70 - 99 mg/dL    Comment: Glucose reference range applies only to samples taken after fasting for at least 8 hours.   Comment 1 Notify RN    Comment 2 Document in Chart   Glucose, capillary     Status: Abnormal   Collection Time: 04/19/22  8:19 AM  Result Value Ref Range   Glucose-Capillary 273 (H) 70 - 99 mg/dL    Comment: Glucose reference range applies only to samples taken after fasting for at least 8 hours.   Comment 1 Notify RN    Comment 2 Document in Chart   Heparin level (unfractionated)     Status: Abnormal   Collection Time: 04/19/22 10:40 AM  Result Value Ref Range   Heparin Unfractionated 0.21 (L) 0.30 - 0.70 IU/mL    Comment: (NOTE) The clinical reportable range upper limit is being lowered to >1.10 to align with the FDA approved guidance for the current laboratory assay.  If heparin results are below expected values, and patient dosage has  been confirmed, suggest follow up testing of antithrombin III levels. Performed at [facility], [place] [address] [place], [place] [address]   Glucose, capillary     Status: Abnormal   Collection Time: 04/19/22 11:54 AM  Result Value Ref Range   Glucose-Capillary 301 (H) 70 - 99 mg/dL    Comment: Glucose reference range applies only to samples taken after fasting for at least 8 hours.   Comment 1 Notify RN    Comment 2 Document in Chart   Glucose, capillary     Status: Abnormal   Collection Time:  04/19/22  4:08 PM  Result Value Ref Range   Glucose-Capillary 376 (H) 70 - 99 mg/dL    Comment: Glucose reference range applies only to samples taken after fasting for at least 8 hours.  Heparin level (unfractionated)     Status: Abnormal   Collection Time: 04/19/22  8:02 PM  Result Value Ref Range   Heparin Unfractionated 0.23 (L) 0.30 - 0.70 IU/mL    Comment: (NOTE) The clinical reportable range upper limit is being lowered to >1.10 to align with the FDA approved guidance for the current laboratory assay.  If heparin results are below expected values, and patient dosage has  been confirmed, suggest follow up testing of antithrombin III levels. Performed at [facility], [place] [address], [place], [place] [address]   Glucose, capillary     Status: Abnormal   Collection Time: 04/19/22  9:39 PM  Result Value Ref Range   Glucose-Capillary 364 (H) 70 - 99 mg/dL    Comment: Glucose reference range applies only to samples taken after fasting for at least 8 hours.   Comment 1 Notify RN    Comment 2 Document in Chart   CBC with Differential/Platelet     Status: Abnormal   Collection Time: 04/20/22  4:16 AM  Result Value Ref Range   WBC 13.8 (H) 4.0 - 10.5 K/uL   RBC 3.94 (L) 4.22 - 5.81 MIL/uL   Hemoglobin 13.7 13.0 - 17.0 g/dL   HCT 39.1 39.0 -  52.0 %   MCV 99.2 80.0 - 100.0 fL   MCH 34.8 (H) 26.0 - 34.0 pg   MCHC 35.0 30.0 - 36.0 g/dL   RDW 14.7 11.5 - 15.5 %   Platelets 145 (L) 150 - 400 K/uL   nRBC 0.5 (H) 0.0 - 0.2 %   Neutrophils Relative % 72 %   Neutro Abs 10.0 (H) 1.7 - 7.7 K/uL   Lymphocytes Relative 16 %   Lymphs Abs 2.2 0.7 - 4.0 K/uL   Monocytes Relative 11 %   Monocytes Absolute 1.5 (H) 0.1 - 1.0 K/uL   Eosinophils Relative 0 %   Eosinophils Absolute 0.0 0.0 - 0.5 K/uL   Basophils Relative 0 %   Basophils Absolute 0.0 0.0 - 0.1 K/uL   Immature Granulocytes 1 %   Abs Immature Granulocytes 0.17 (H) 0.00 - 0.07 K/uL    Comment: Performed at [facility] [address], [place], [place] [address]  Comprehensive metabolic panel     Status: Abnormal   Collection Time: 04/20/22  4:16 AM  Result Value Ref Range   Sodium 130 (L) 135 - 145 mmol/L   Potassium 3.9 3.5 - 5.1 mmol/L   Chloride 98 98 - 111 mmol/L   CO2 22 22 - 32 mmol/L   Glucose, Bld 223 (H) 70 - 99 mg/dL    Comment: Glucose reference range applies only to samples taken after fasting for at least 8 hours.   BUN 37 (H) 8 - 23 mg/dL   Creatinine, Ser 1.74 (H) 0.61 - 1.24 mg/dL   Calcium 8.7 (L) 8.9 - 10.3 mg/dL   Total Protein 7.4 6.5 - 8.1 g/dL   Albumin 2.4 (L) 3.5 - 5.0 g/dL   AST 63 (H) 15 - 41 U/L   ALT 49 (H) 0 - 44 U/L   Alkaline Phosphatase 104 38 - 126 U/L   Total Bilirubin 1.9 (H) 0.3 - 1.2 mg/dL   GFR, Estimated 42 (L) >60 mL/min    Comment: (NOTE) Calculated using the CKD-EPI Creatinine Equation (2021)    Anion gap 10 5 - 15    Comment: Performed at [facility], [place] [address], [place], [place] [address]  Magnesium     Status: None   Collection Time: 04/20/22  4:16 AM  Result Value Ref Range   Magnesium 2.1 1.7 - 2.4 mg/dL    Comment: Performed at [facility] [address], [place], [place] [address]  Phosphorus     Status: None   Collection Time: 04/20/22  4:16 AM  Result Value Ref Range   Phosphorus 2.5 2.5 - 4.6 mg/dL    Comment: Performed at [facility] [address], [place], [place] [address]  Heparin level (unfractionated)     Status: None   Collection Time: 04/20/22  4:16 AM  Result Value Ref Range   Heparin Unfractionated 0.33 0.30 - 0.70 IU/mL    Comment: (NOTE) The clinical reportable range upper limit is being lowered to >1.10 to align with the FDA approved guidance for the current laboratory assay.  If heparin results are below expected values, and patient dosage has  been confirmed, suggest follow up testing of antithrombin III levels. Performed at [facility], [place] [address], [place],  [place] [address]   Glucose, capillary     Status: Abnormal   Collection Time: 04/20/22  6:06 AM  Result Value Ref Range   Glucose-Capillary 231 (H) 70 - 99  mg/dL    Comment: Glucose reference range applies only to samples taken after fasting for at least 8 hours.    ECG   N/A - Personally Reviewed  Telemetry   Sinus rhythm - Personally Reviewed  Radiology    CT MAXILLOFACIAL WO CONTRAST  Result Date: 04/19/2022 CLINICAL DATA:  Bacteremia and dental pain. Concern for dental infection. EXAM: CT MAXILLOFACIAL WITHOUT CONTRAST TECHNIQUE: Multidetector CT imaging of the maxillofacial structures was performed. Multiplanar CT image reconstructions were also generated. RADIATION DOSE REDUCTION: This exam was performed according to the departmental dose-optimization program which includes automated exposure control, adjustment of the mA and/or kV according to patient size and/or use of iterative reconstruction technique. COMPARISON:  Head MRI and head CT 04/12/2022 FINDINGS: Osseous: No acute fracture. Absent maxillary and largely absent mandibular dentition. Two small remaining teeth/teeth fragments in the mandibular left lateral incisor and canine region and one in the right mandibular premolar region. Mild periapical lucency surrounding the two left-sided teeth with possible mild overlying soft tissue swelling. No evidence of a drainable fluid collection on this unenhanced study. Orbits: Unremarkable. Sinuses: Minimal mucosal thickening in the paranasal sinuses. No fluid. Clear mastoid air cells and middle ear cavities. Soft tissues: Mild atherosclerotic calcification of the carotid bifurcations. Limited intracranial: No acute finding. IMPRESSION: Largely absent dentition. Mild periapical lucency surrounding the two remaining left-sided mandibular teeth fragments with possible mild overlying soft tissue swelling. No evidence of a drainable fluid collection on this unenhanced study. Electronically Signed   By:  Logan Bores M.D.   On: 04/19/2022 18:13   MR LUMBAR SPINE WO CONTRAST  Result Date: 04/18/2022 CLINICAL DATA:  Low back pain, infection suspected EXAM: MRI LUMBAR SPINE WITHOUT CONTRAST TECHNIQUE: Multiplanar, multisequence MR imaging of the lumbar spine was performed. No intravenous contrast was administered. COMPARISON:  No prior MRI of the lumbar spine, correlation is made with CT lumbar spine 04/17/2022 FINDINGS: Evaluation is somewhat limited by motion artifact. Segmentation:  5 lumbar type vertebral bodies. Alignment: No significant listhesis. Straightening of the normal lumbar lordosis. Vertebrae: No fracture, evidence of discitis, or bone lesion. Congenitally short pedicles, which narrow the AP diameter of the spinal canal. Conus medullaris and cauda equina: Limited evaluation due to motion artifact and thecal sac narrowing, in part due to epidural lipomatosis. Paraspinal and other soft tissues: Limited by motion. No acute finding. Disc levels: Disc hydration is largely preserved. T12-L1: No significant disc bulge. Moderate thecal sac narrowing, in part secondary to epidural lipomatosis. No neural foraminal narrowing. L1-L2: No significant disc bulge. Mild facet arthropathy. Moderate thecal sac narrowing, in part secondary to epidural lipomatosis. No neural foraminal narrowing. L2-L3: No significant disc bulge. Mild facet arthropathy. Mild to moderate thecal sac narrowing, in part secondary to epidural lipomatosis. No neural foraminal narrowing. L3-L4: Minimal disc bulge. Moderate facet arthropathy. Moderate thecal sac narrowing, in part secondary to epidural lipomatosis. Mild bilateral neural foraminal narrowing. L4-L5: Mild disc bulge. Moderate facet arthropathy. Moderate thecal sac narrowing, in part secondary to epidural lipomatosis. Mild bilateral neural foraminal narrowing. L5-S1: Minimal disc bulge. Mild facet arthropathy. Moderate thecal sac narrowing, secondary to epidural lipomatosis. Mild  right neural foraminal narrowing. IMPRESSION: 1. Evaluation is limited by motion artifact. 2. No evidence of discitis-osteomyelitis. No epidural abscess. 3. Congenitally short pedicles, which narrow the AP diameter of the spinal canal, and epidural lipomatosis, which results in moderate thecal sac narrowing at multiple levels. 4. Mild neural foraminal narrowing bilaterally at L3-L4 and L4-L5 and on the right at L5-S1.  Electronically Signed   By: Merilyn Baba M.D.   On: 04/18/2022 23:05   NM Pulmonary Perfusion  Result Date: 04/18/2022 CLINICAL DATA:  Hypoxia EXAM: NUCLEAR MEDICINE PERFUSION LUNG SCAN TECHNIQUE: Perfusion images were obtained in multiple projections after intravenous injection of radiopharmaceutical. Ventilation scans intentionally deferred if perfusion scan and chest x-ray adequate for interpretation during COVID 19 epidemic. RADIOPHARMACEUTICALS:  4.0 mCi Tc-74mMAA IV COMPARISON:  Portable chest radiograph and CT from the same day FINDINGS: Significant perfusion attenuation involving the entire right upper lobe. Otherwise physiologic distribution of radiopharmaceutical. IMPRESSION: Solitary right upper lobe perfusion defect without radiographic correlate, suggestive of pulmonary embolus. Electronically Signed   By: DLucrezia EuropeM.D.   On: 04/18/2022 14:53   DG CHEST PORT 1 VIEW  Result Date: 04/18/2022 CLINICAL DATA:  Respiratory failure with hypoxia. EXAM: PORTABLE CHEST 1 VIEW COMPARISON:  April 17, 2022. FINDINGS: Stable cardiomegaly with mild central pulmonary vascular congestion. Status post coronary artery bypass graft. Lungs are clear. Bony thorax is unremarkable. IMPRESSION: Stable cardiomegaly with mild central pulmonary vascular congestion. Electronically Signed   By: JMarijo ConceptionM.D.   On: 04/18/2022 12:49    Cardiac Studies   See echo above  Assessment   Principal Problem:   Acute respiratory failure with hypoxia (HCC) Active Problems:   Bacteremia   Severe  tricuspid regurgitation   Pulmonary embolism (HCC)   RVF (right ventricular failure) ([place]   Plan   Renal function improving off diuretics. On heparin for PE. TEE requested to r/o endocarditis next week - he would need to be able to lie flat for the procedure. Would continue to hold diuretics and track renal function.  Time Spent Directly with Patient:  I have spent a total of 25 minutes with the patient reviewing hospital notes, telemetry, EKGs, labs and examining the patient as well as establishing an assessment and plan that was discussed personally with the patient.  > 50% of time was spent in direct patient care.  Length of Stay:  LOS: 2 days   KPixie Casino MD, FChi Health St. Francis FBen LomondDirector of the [facility] Lip[facility]  [facility] Diplomate of the American Board of Clinical Lipidology Attending Cardiologist  Direct Dial: [phone] Fax: [phone] Website:  [email or web address] 04/20/2022, 11:23 AM

## 2022-04-20 NOTE — Progress Notes (Signed)
ANTICOAGULATION CONSULT NOTE  Pharmacy Consult for heparin Indication: pulmonary embolus  Allergies  Allergen Reactions   Empagliflozin Rash    Patient reports yeast.    Ibuprofen Other (See Comments)    Stomach pain    Patient Measurements: Height: '5\' 11"'$  (180.3 cm) Weight: (!) 140.3 kg (309 lb 4.9 oz) IBW/kg (Calculated) : 75.3 Heparin Dosing Weight: 107kg  Vital Signs: Temp: 97.7 F (36.5 C) (03/10 0413) Temp Source: Oral (03/10 0413) BP: 106/65 (03/10 0413) Pulse Rate: 60 (03/10 0413)  Labs: Recent Labs    04/18/22 0050 04/19/22 0123 04/19/22 0124 04/19/22 0125 04/19/22 1040 04/19/22 2002 04/20/22 0416  HGB 14.0  --   --  12.9*  --   --  13.7  HCT 40.5  --   --  37.5*  --   --  39.1  PLT 155  --   --  148*  --   --  145*  HEPARINUNFRC  --    < >  --   --  0.21* 0.23* 0.33  CREATININE 2.95*  --  2.36*  --   --   --  1.74*   < > = values in this interval not displayed.     Estimated Creatinine Clearance: 58.2 mL/min (A) (by C-G formula based on SCr of 1.74 mg/dL (H)).  Assessment: 67 y.o. male with GAS bacteremia/PNA. V/Q showed PE on 3/8. Pharmacy consulted for heparin dosing.   Heparin level today is therapeutic at 0.33, on 2100 units/hr. Hgb 13.7, plt 145--stable. No s/sx of bleeding or infusion issues reported.   Goal of Therapy:  Heparin level 0.3-0.7 units/ml Monitor platelets by anticoagulation protocol: Yes   Plan:  Continue heparin gtt '@2100'$  units/hr  F/u 8 hr confirmatory heparin level  Monitor CBC, heparin level, and s/sx of bleeding daily    Billey Gosling, PharmD PGY1 Pharmacy Resident 3/10/20248:14 AM

## 2022-04-20 NOTE — Progress Notes (Addendum)
[facility] Progress Note  Patient: [name] [name]     [id number]  DOA: 04/16/2022   PCP: [name], MD       Brief hospital course: This is a 67 year old male with coronary artery disease status post CABG in [id number], diastolic heart failure, cirrhosis of liver, ectopic atrial rhythm, hypertension, CKD 3A, diabetes mellitus type 2, sleep apnea, morbid obesity, peripheral neuropathy and varicose veins who presented to the hospital for back pain and found to be hypoxemic.  He was started on treatment for heart failure.  After receiving Lasix Cozaar and Lopressor, he became hypotensive in the ED. On 3/8 he was found to have PE on VQ scan. Also found to be febrile and then noted to have blood cultures positive for streptococcal pyogenes. ID and cardiology are assisting with management.  ID has recommended a TEE.  Creatinine was 1.58-1.6 when first admitted but then rose to 2.95 on 3/8 after IV Lasix was given and after noted to be hypotensive-Lasix has been on hold since.  Creatinine has improved  Subjective:  Sitting up in a chair.  He has no complaints  Assessment and Plan: Principal Problem:   Acute respiratory failure with hypoxia (HCC)- PE -Currently on heparin infusion f- had RUL perfusion defect on VQ - LE ultrasound neg   Active Problems: Right-sided heart failure-hyponatremia - Furosemide was given on 3/ 7 and has not been given since - eplerinone on hold - cont lopressor  Strep pyogenes bacteremia-leukocytosis -Receiving pen G-appreciate ID follow-up -WBC was 17 and has improved to 13.8 - Has severe tricuspid regurgitation on echo-will need TEE  - MR L spine neg - CT maxillofacial shows : Mild periapical lucency surrounding the two left-sided teeth with possible mild overlying soft tissue swelling. No abscess     Severe tricuspid regurgitation  - ? If vegetation or due to PE  Mild thrombocytopenia - Platelets 145 - Follow  intermittently  Uncontrolled DM 2 with Hyperglycemia - Sugars in the 2-300s - Increase Semglee to 50 units from 40-continue mealtime insulin and NovoLog sliding scale - A1c 9.7  Bells Palsy - prednisone 40 mg daily for total of 4 days completed  OSA - refused CPAP  Addendum: vomiting x 2 today per RN- abd xray ordered     Code Status: Full Code Consultants: cardiology, ID Level of Care: Level of care: Progressive Total time on patient care: 35 min DVT prophylaxis:  Heparin infusion   Objective:   Vitals:   04/19/22 2355 04/20/22 0105 04/20/22 0413 04/20/22 1200  BP: 115/74 (!) 110/6 106/65 100/62  Pulse: 64 (!) 56 60 70  Resp: '18 19 15 18  '$ Temp: 99 F (37.2 C) 99 F (37.2 C) 97.7 F (36.5 C) (!) 97.2 F (36.2 C)  TempSrc: Axillary Oral Oral Oral  SpO2: 97% 96% 95% 97%  Weight:  (!) 140.3 kg    Height:       Filed Weights   04/18/22 0025 04/19/22 0018 04/20/22 0105  Weight: (!) 140.6 kg (!) 140.3 kg (!) 140.3 kg   Exam: General exam: Appears comfortable  HEENT: oral mucosa moist Respiratory system: Clear to auscultation.  Cardiovascular system: S1 & S2 heard  Gastrointestinal system: Abdomen soft, non-tender, nondistended. Normal bowel sounds   Extremities: No cyanosis, clubbing - 2 + pedal edema Psychiatry:  Mood & affect appropriate.      CBC: Recent Labs  Lab 04/17/22 0045 04/17/22 0830 04/18/22 0050 04/19/22 0125 04/20/22 0416  WBC 10.6* 13.0* 17.5* 14.3*  13.8*  NEUTROABS  --   --   --   --  10.0*  HGB 14.0 14.4 14.0 12.9* 13.7  HCT 41.6 43.9 40.5 37.5* 39.1  MCV 103.0* 104.0* 102.3* 100.3* 99.2  PLT 189 185 155 148* [id number]*   Basic Metabolic Panel: Recent Labs  Lab 04/15/22 1155 04/17/22 0045 04/17/22 0830 04/18/22 0050 04/19/22 0124 04/20/22 0416  NA 137 137  --  133* 129* 130*  K 3.7 3.8  --  4.5 4.3 3.9  CL 100 100  --  98 97* 98  CO2 27 23  --  20* 20* 22  GLUCOSE 220* 217*  --  208* 262* 223*  BUN 26* 24*  --  34* 42* 37*   CREATININE 1.58* 1.62* 1.47* 2.95* 2.36* 1.74*  CALCIUM 9.0 9.2  --  8.4* 8.3* 8.7*  MG  --   --   --   --   --  2.1  PHOS  --   --   --   --   --  2.5   GFR: Estimated Creatinine Clearance: 58.2 mL/min (A) (by C-G formula based on SCr of 1.74 mg/dL (H)).  Scheduled Meds:  artificial tears   Right Eye Q4H   aspirin EC  81 mg Oral Daily   famotidine  20 mg Oral Daily   insulin aspart  0-20 Units Subcutaneous TID WC   insulin aspart  0-5 Units Subcutaneous QHS   insulin aspart  8 Units Subcutaneous TID WC   insulin glargine-yfgn  50 Units Subcutaneous Daily   metoprolol tartrate  12.5 mg Oral BID   pantoprazole  40 mg Oral Daily   polyethylene glycol  17 g Oral Daily   rosuvastatin  20 mg Oral QPM   sodium chloride flush  3 mL Intravenous Q12H   Continuous Infusions:  heparin 2,100 Units/hr (04/20/22 0920)   penicillin G potassium 12 Million Units in dextrose 5 % 500 mL CONTINUOUS infusion 12 Million Units (04/20/22 1142)   Imaging and lab data was personally reviewed CT MAXILLOFACIAL WO CONTRAST  Result Date: 04/19/2022 CLINICAL DATA:  Bacteremia and dental pain. Concern for dental infection. EXAM: CT MAXILLOFACIAL WITHOUT CONTRAST TECHNIQUE: Multidetector CT imaging of the maxillofacial structures was performed. Multiplanar CT image reconstructions were also generated. RADIATION DOSE REDUCTION: This exam was performed according to the departmental dose-optimization program which includes automated exposure control, adjustment of the mA and/or kV according to patient size and/or use of iterative reconstruction technique. COMPARISON:  Head MRI and head CT 04/12/2022 FINDINGS: Osseous: No acute fracture. Absent maxillary and largely absent mandibular dentition. Two small remaining teeth/teeth fragments in the mandibular left lateral incisor and canine region and one in the right mandibular premolar region. Mild periapical lucency surrounding the two left-sided teeth with possible mild  overlying soft tissue swelling. No evidence of a drainable fluid collection on this unenhanced study. Orbits: Unremarkable. Sinuses: Minimal mucosal thickening in the paranasal sinuses. No fluid. Clear mastoid air cells and middle ear cavities. Soft tissues: Mild atherosclerotic calcification of the carotid bifurcations. Limited intracranial: No acute finding. IMPRESSION: Largely absent dentition. Mild periapical lucency surrounding the two remaining left-sided mandibular teeth fragments with possible mild overlying soft tissue swelling. No evidence of a drainable fluid collection on this unenhanced study. Electronically Signed   By: Logan Bores M.D.   On: 04/19/2022 18:13   MR LUMBAR SPINE WO CONTRAST  Result Date: 04/18/2022 CLINICAL DATA:  Low back pain, infection suspected EXAM: MRI LUMBAR SPINE WITHOUT CONTRAST TECHNIQUE:  Multiplanar, multisequence MR imaging of the lumbar spine was performed. No intravenous contrast was administered. COMPARISON:  No prior MRI of the lumbar spine, correlation is made with CT lumbar spine 04/17/2022 FINDINGS: Evaluation is somewhat limited by motion artifact. Segmentation:  5 lumbar type vertebral bodies. Alignment: No significant listhesis. Straightening of the normal lumbar lordosis. Vertebrae: No fracture, evidence of discitis, or bone lesion. Congenitally short pedicles, which narrow the AP diameter of the spinal canal. Conus medullaris and cauda equina: Limited evaluation due to motion artifact and thecal sac narrowing, in part due to epidural lipomatosis. Paraspinal and other soft tissues: Limited by motion. No acute finding. Disc levels: Disc hydration is largely preserved. T12-L1: No significant disc bulge. Moderate thecal sac narrowing, in part secondary to epidural lipomatosis. No neural foraminal narrowing. L1-L2: No significant disc bulge. Mild facet arthropathy. Moderate thecal sac narrowing, in part secondary to epidural lipomatosis. No neural foraminal  narrowing. L2-L3: No significant disc bulge. Mild facet arthropathy. Mild to moderate thecal sac narrowing, in part secondary to epidural lipomatosis. No neural foraminal narrowing. L3-L4: Minimal disc bulge. Moderate facet arthropathy. Moderate thecal sac narrowing, in part secondary to epidural lipomatosis. Mild bilateral neural foraminal narrowing. L4-L5: Mild disc bulge. Moderate facet arthropathy. Moderate thecal sac narrowing, in part secondary to epidural lipomatosis. Mild bilateral neural foraminal narrowing. L5-S1: Minimal disc bulge. Mild facet arthropathy. Moderate thecal sac narrowing, secondary to epidural lipomatosis. Mild right neural foraminal narrowing. IMPRESSION: 1. Evaluation is limited by motion artifact. 2. No evidence of discitis-osteomyelitis. No epidural abscess. 3. Congenitally short pedicles, which narrow the AP diameter of the spinal canal, and epidural lipomatosis, which results in moderate thecal sac narrowing at multiple levels. 4. Mild neural foraminal narrowing bilaterally at L3-L4 and L4-L5 and on the right at L5-S1. Electronically Signed   By: Merilyn Baba M.D.   On: 04/18/2022 23:05    LOS: 2 days   Author: Debbe Odea  04/20/2022 2:54 PM  To contact [facility]>   Check the care team in [facility] and look for the attending/consulting [place] provider listed  Log into [email or web address] and use [place]'s universal password   Go to> "[facility]"  and find provider  If you still have difficulty reaching the provider, please page the [facility] (Director on Call) for the Hospitalists listed on amion

## 2022-04-20 NOTE — Progress Notes (Signed)
[facility] for Infectious Disease  Date of Admission:  04/16/2022   Total days of inpatient antibiotics 4  Principal Problem:   Acute respiratory failure with hypoxia (HCC) Active Problems:   Bacteremia   Severe tricuspid regurgitation   Pulmonary embolism (HCC)   RVF (right ventricular failure) ([place])          Assessment: 29 YM admitted with respiratory failure secondary to CHF exacerbation, strep pyogenes bacteremia: Presented with back pain and fever.   #Streptococcus pyogenes bacteremia secondary to possible oral source #Bilateral lower extremity wounds-do not appear infected #TV regurg - CT chest showed no acute intrathoracic process.  Stable cardiomegaly. - CT L-spine showed no acute bony abnormality -CT abdomen pelvis no acute findings in abdomen pelvis. MRI L spine did not show discitis/osteo/epidural abscess -TTE showed TV regurg was torrential, due to severe leaflet malcoaptation -Ct max showed left sided mandibular teeth with overlying soft tissue swelling, no drainable fluid collection, this could be source of bacteremia.  Recommendations: -Cardiology notes could pursue TEE next week if respiratory status improves.  -Follow repeat blood Cx to ensure clearance -Continue penicillin   #Diabetes mellitus-uncontrolled A1c 9.7 on 3/7.   #CHF exacerbation - Cardiology following    #Possible PE -Nuclear medicine perf showed possible RUL PE -On heparin gtt Microbiology:   Antibiotics: Pen 3/7- Linezolid 3/7   SUBJECTIVE: NO new compalinats. Sitting in chair.  Interval: afebrile overnight, wbc 13.8k  Review of Systems: Review of Systems  All other systems reviewed and are negative.    Scheduled Meds:  artificial tears   Right Eye Q4H   aspirin EC  81 mg Oral Daily   famotidine  20 mg Oral Daily   insulin aspart  0-20 Units Subcutaneous TID WC   insulin aspart  0-5 Units Subcutaneous QHS   insulin aspart  8 Units Subcutaneous TID WC    insulin glargine-yfgn  50 Units Subcutaneous Daily   metoprolol tartrate  12.5 mg Oral BID   pantoprazole  40 mg Oral Daily   polyethylene glycol  17 g Oral Daily   rosuvastatin  20 mg Oral QPM   sodium chloride flush  3 mL Intravenous Q12H   Continuous Infusions:  heparin 2,100 Units/hr (04/20/22 0920)   penicillin G potassium 12 Million Units in dextrose 5 % 500 mL CONTINUOUS infusion 12 Million Units (04/20/22 1142)   PRN Meds:.acetaminophen **OR** acetaminophen, [COMPLETED] ipratropium-albuterol **FOLLOWED BY** ipratropium-albuterol, ondansetron **OR** ondansetron (ZOFRAN) IV, pregabalin Allergies  Allergen Reactions   Empagliflozin Rash    Patient reports yeast.    Ibuprofen Other (See Comments)    Stomach pain    OBJECTIVE: Vitals:   04/19/22 2355 04/20/22 0105 04/20/22 0413 04/20/22 1200  BP: 115/74 (!) 110/6 106/65   Pulse: 64 (!) 56 60   Resp: '18 19 15   '$ Temp: 99 F (37.2 C) 99 F (37.2 C) 97.7 F (36.5 C)   TempSrc: Axillary Oral Oral   SpO2: 97% 96% 95% 97%  Weight:  (!) 140.3 kg    Height:       Body mass index is 43.14 kg/m.  Physical Exam Constitutional:      General: He is not in acute distress.    Appearance: He is normal weight. He is not toxic-appearing.  HENT:     Head: Normocephalic and atraumatic.     Right Ear: External ear normal.     Left Ear: External ear normal.     Nose: No  congestion or rhinorrhea.     Mouth/Throat:     Mouth: Mucous membranes are moist.     Pharynx: Oropharynx is clear.  Eyes:     Extraocular Movements: Extraocular movements intact.     Conjunctiva/sclera: Conjunctivae normal.     Pupils: Pupils are equal, round, and reactive to light.  Cardiovascular:     Rate and Rhythm: Normal rate and regular rhythm.     Heart sounds: No murmur heard.    No friction rub. No gallop.  Pulmonary:     Effort: Pulmonary effort is normal.     Breath sounds: Normal breath sounds.  Abdominal:     General: Abdomen is flat. Bowel  sounds are normal.     Palpations: Abdomen is soft.  Musculoskeletal:        General: No swelling. Normal range of motion.     Cervical back: Normal range of motion and neck supple.  Skin:    General: Skin is warm and dry.  Neurological:     General: No focal deficit present.     Mental Status: He is oriented to person, place, and time.  Psychiatric:        Mood and Affect: Mood normal.       Lab Results Lab Results  Component Value Date   WBC 13.8 (H) 04/20/2022   HGB 13.7 04/20/2022   HCT 39.1 04/20/2022   MCV 99.2 04/20/2022   PLT 145 (L) 04/20/2022    Lab Results  Component Value Date   CREATININE 1.74 (H) 04/20/2022   BUN 37 (H) 04/20/2022   NA 130 (L) 04/20/2022   K 3.9 04/20/2022   CL 98 04/20/2022   CO2 22 04/20/2022    Lab Results  Component Value Date   ALT 49 (H) 04/20/2022   AST 63 (H) 04/20/2022   ALKPHOS 104 04/20/2022   BILITOT 1.9 (H) 04/20/2022        Laurice Record, [place] [facility] 04/20/2022, 12:13 PM

## 2022-04-20 NOTE — Progress Notes (Signed)
ANTICOAGULATION CONSULT NOTE  Pharmacy Consult for heparin Indication: pulmonary embolus  Allergies  Allergen Reactions   Empagliflozin Rash    Patient reports yeast.    Ibuprofen Other (See Comments)    Stomach pain    Patient Measurements: Height: '5\' 11"'$  (180.3 cm) Weight: (!) 140.3 kg (309 lb 4.9 oz) IBW/kg (Calculated) : 75.3 Heparin Dosing Weight: 107kg  Vital Signs: Temp: 97.2 F (36.2 C) (03/10 1200) Temp Source: Oral (03/10 1200) BP: 100/62 (03/10 1200) Pulse Rate: 70 (03/10 1200)  Labs: Recent Labs    04/18/22 0050 04/19/22 0123 04/19/22 0124 04/19/22 0125 04/19/22 1040 04/19/22 2002 04/20/22 0416 04/20/22 1252  HGB 14.0  --   --  12.9*  --   --  13.7  --   HCT 40.5  --   --  37.5*  --   --  39.1  --   PLT 155  --   --  148*  --   --  145*  --   HEPARINUNFRC  --    < >  --   --    < > 0.23* 0.33 0.58  CREATININE 2.95*  --  2.36*  --   --   --  1.74*  --    < > = values in this interval not displayed.     Estimated Creatinine Clearance: 58.2 mL/min (A) (by C-G formula based on SCr of 1.74 mg/dL (H)).  Assessment: 67 y.o. male with GAS bacteremia/PNA. V/Q showed PE on 3/8. Pharmacy consulted for heparin dosing.   Heparin level remains therapeutic at 0.58, on 2100 units/hr. Hgb 13.7, plt 145--stable. No s/sx of bleeding or infusion issues reported.   Goal of Therapy:  Heparin level 0.3-0.7 units/ml Monitor platelets by anticoagulation protocol: Yes   Plan:  Continue heparin gtt '@2100'$  units/hr  Monitor CBC, heparin level, and s/sx of bleeding daily TEE requested to r/o endocarditis next week   Billey Gosling, PharmD PGY1 Pharmacy Resident 3/10/20241:32 PM

## 2022-04-21 ENCOUNTER — Inpatient Hospital Stay (HOSPITAL_COMMUNITY): Payer: 59

## 2022-04-21 DIAGNOSIS — R7881 Bacteremia: Secondary | ICD-10-CM | POA: Diagnosis not present

## 2022-04-21 DIAGNOSIS — N179 Acute kidney failure, unspecified: Secondary | ICD-10-CM

## 2022-04-21 DIAGNOSIS — J9601 Acute respiratory failure with hypoxia: Secondary | ICD-10-CM | POA: Diagnosis not present

## 2022-04-21 DIAGNOSIS — I2609 Other pulmonary embolism with acute cor pulmonale: Secondary | ICD-10-CM | POA: Diagnosis not present

## 2022-04-21 LAB — URINALYSIS, ROUTINE W REFLEX MICROSCOPIC
Bacteria, UA: NONE SEEN
Bilirubin Urine: NEGATIVE
Glucose, UA: NEGATIVE mg/dL
Ketones, ur: NEGATIVE mg/dL
Leukocytes,Ua: NEGATIVE
Nitrite: NEGATIVE
Protein, ur: NEGATIVE mg/dL
Specific Gravity, Urine: 1.02 (ref 1.005–1.030)
pH: 5 (ref 5.0–8.0)

## 2022-04-21 LAB — HEPARIN LEVEL (UNFRACTIONATED)
Heparin Unfractionated: 0.8 IU/mL — ABNORMAL HIGH (ref 0.30–0.70)
Heparin Unfractionated: 0.29 IU/mL — ABNORMAL LOW (ref 0.30–0.70)
Heparin Unfractionated: 0.46 IU/mL (ref 0.30–0.70)

## 2022-04-21 LAB — GLUCOSE, CAPILLARY
Glucose-Capillary: 84 mg/dL (ref 70–99)
Glucose-Capillary: 66 mg/dL — ABNORMAL LOW (ref 70–99)
Glucose-Capillary: 71 mg/dL (ref 70–99)
Glucose-Capillary: 151 mg/dL — ABNORMAL HIGH (ref 70–99)
Glucose-Capillary: 95 mg/dL (ref 70–99)

## 2022-04-21 LAB — CBC
HCT: 39.2 % (ref 39.0–52.0)
Hemoglobin: 13.8 g/dL (ref 13.0–17.0)
MCH: 34.7 pg — ABNORMAL HIGH (ref 26.0–34.0)
MCHC: 35.2 g/dL (ref 30.0–36.0)
MCV: 98.5 fL (ref 80.0–100.0)
Platelets: 165 10*3/uL (ref 150–400)
RBC: 3.98 MIL/uL — ABNORMAL LOW (ref 4.22–5.81)
RDW: 14.6 % (ref 11.5–15.5)
WBC: 13.4 10*3/uL — ABNORMAL HIGH (ref 4.0–10.5)
nRBC: 0.7 % — ABNORMAL HIGH (ref 0.0–0.2)

## 2022-04-21 MED ORDER — IOHEXOL 350 MG/ML SOLN
75.0000 mL | Freq: Once | INTRAVENOUS | Status: AC | PRN
  Administered 2022-04-21: 75 mL via INTRAVENOUS

## 2022-04-21 MED ORDER — SENNOSIDES-DOCUSATE SODIUM 8.6-50 MG PO TABS: 1.0000 | ORAL_TABLET | Freq: Every evening | ORAL | Status: DC | PRN

## 2022-04-21 MED ORDER — HYDRALAZINE HCL 20 MG/ML IJ SOLN: 10.0000 mg | INTRAMUSCULAR | Status: DC | PRN

## 2022-04-21 MED ORDER — METOPROLOL TARTRATE 5 MG/5ML IV SOLN: 5.0000 mg | INTRAVENOUS | Status: DC | PRN

## 2022-04-21 MED ORDER — MORPHINE SULFATE (PF) 2 MG/ML IV SOLN: 2.0000 mg | Freq: Once | INTRAVENOUS | Status: DC

## 2022-04-21 MED ORDER — TRAZODONE HCL 50 MG PO TABS
50.0000 mg | ORAL_TABLET | Freq: Every evening | ORAL | Status: DC | PRN
  Administered 2022-04-21 – 2022-04-24 (×2): 50 mg via ORAL
  Filled 2022-04-21 (×2): qty 1

## 2022-04-21 MED ORDER — INSULIN GLARGINE-YFGN 100 UNIT/ML ~~LOC~~ SOLN
50.0000 [IU] | Freq: Every day | SUBCUTANEOUS | Status: DC
  Filled 2022-04-21 (×2): qty 0.5

## 2022-04-21 MED ORDER — GUAIFENESIN 100 MG/5ML PO LIQD: 5.0000 mL | ORAL | Status: DC | PRN

## 2022-04-21 NOTE — Progress Notes (Signed)
Pt refusing cpap for the night. ?

## 2022-04-21 NOTE — Progress Notes (Signed)
PROGRESS NOTE    BANKS QU  [id number] DOB: Feb 19, 1955 DOA: 04/16/2022 PCP: [name], MD   Brief Narrative:  67 year old male with coronary artery disease status post CABG in [id number], diastolic heart failure, cirrhosis of liver, ectopic atrial rhythm, hypertension, CKD 3A, diabetes mellitus type 2, sleep apnea, morbid obesity, peripheral neuropathy and varicose veins who presented to the hospital for back pain and found to be hypoxemic.  He was started on treatment for heart failure.  After receiving Lasix Cozaar and Lopressor, he became hypotensive in the ED. VQ scan on 3 8 showed pulmonary embolism, cultures ended up growing streptococcal pyogenes.  ID and cardiology was consulted.   Assessment & Plan:  Principal Problem:   Acute respiratory failure with hypoxia (HCC) Active Problems:   Bacteremia   Severe tricuspid regurgitation   Pulmonary embolism (HCC)   RVF (right ventricular failure) ([place])   Right upper lobe pulmonary embolism -VQ scan showed right upper lobe perfusion defect consistent with pulmonary embolism.  Currently on heparin drip.  Lower extremity Dopplers is negative for DVT  Streptococcus pyogenes bacteremia -Currently on penicillin G.  Echocardiogram shows severe TR.  ID and cardiology following.  Planning on TEE.  CT mandible shows multiple lucencies  Acute kidney injury - Baseline creatinine 1.7, peak during this admission at 2.9.  Improving   Right-sided heart failure -Eplerenone on hold.  On Lopressor  Nausea vomiting - Suspicion for possible UTI, retroperitoneal lymphadenopathy.  Check UA.  Will need repeat CT scan in about 3 months which can be done by PCP.  Antiemetics as needed     Severe tricuspid regurgitation  - ? If vegetation or due to PE   Mild thrombocytopenia - Platelets 145 - Follow intermittently   Uncontrolled DM 2 with Hyperglycemia -Long-acting insulin, sliding scale and Accu-Chek.  Adjust as necessary   Bells  Palsy Completed 4 days of p.o. prednisone   OSA Refusing CPAP   DVT prophylaxis: Heparin Drip Code Status: Full Family Communication:    Status is: Inpatient Remains inpatient appropriate because: Planned TEE.  Ongoing evaluation   Nutritional status          Body mass index is 42.89 kg/m.         Subjective:  Patient is doing okay.  No complaints.  Remains afebrile  Examination:  General exam: Appears calm and comfortable  Respiratory system: Clear to auscultation. Respiratory effort normal. Cardiovascular system: S1 & S2 heard, RRR. No JVD, murmurs, rubs, gallops or clicks.  1+ pedal edema. Gastrointestinal system: Abdomen is nondistended, soft and nontender. No organomegaly or masses felt. Normal bowel sounds heard. Central nervous system: Alert and oriented. No focal neurological deficits. Extremities: Symmetric 5 x 5 power. Skin: No rashes, lesions or ulcers Psychiatry: Judgement and insight appear normal. Mood & affect appropriate.     Objective: Vitals:   04/20/22 1733 04/20/22 1932 04/21/22 0009 04/21/22 0502  BP: 114/73 (!) 135/100 127/74 126/77  Pulse: 66 61 70 65  Resp: '20 20 20 12  '$ Temp: 97.6 F (36.4 C) 97.6 F (36.4 C) 97.6 F (36.4 C) 97.6 F (36.4 C)  TempSrc: Oral Oral Oral Oral  SpO2: 96% 93% 99% 94%  Weight:   (!) 139.5 kg   Height:        Intake/Output Summary (Last 24 hours) at 04/21/2022 0856 Last data filed at 04/21/2022 0506 Gross per 24 hour  Intake 872.24 ml  Output 3500 ml  Net -2627.76 ml   [employer]  04/19/22 0018 04/20/22 0105 04/21/22 0009  Weight: (!) 140.3 kg (!) 140.3 kg (!) 139.5 kg     Data Reviewed:   CBC: Recent Labs  Lab 04/17/22 0830 04/18/22 0050 04/19/22 0125 04/20/22 0416 04/21/22 0046  WBC 13.0* 17.5* 14.3* 13.8* 13.4*  NEUTROABS  --   --   --  10.0*  --   HGB 14.4 14.0 12.9* 13.7 13.8  HCT 43.9 40.5 37.5* 39.1 39.2  MCV 104.0* 102.3* 100.3* 99.2 98.5  PLT 185 155 148* 145* [id number]    Basic Metabolic Panel: Recent Labs  Lab 04/15/22 1155 04/17/22 0045 04/17/22 0830 04/18/22 0050 04/19/22 0124 04/20/22 0416  NA 137 137  --  133* 129* 130*  K 3.7 3.8  --  4.5 4.3 3.9  CL 100 100  --  98 97* 98  CO2 27 23  --  20* 20* 22  GLUCOSE 220* 217*  --  208* 262* 223*  BUN 26* 24*  --  34* 42* 37*  CREATININE 1.58* 1.62* 1.47* 2.95* 2.36* 1.74*  CALCIUM 9.0 9.2  --  8.4* 8.3* 8.7*  MG  --   --   --   --   --  2.1  PHOS  --   --   --   --   --  2.5   GFR: Estimated Creatinine Clearance: 58 mL/min (A) (by C-G formula based on SCr of 1.74 mg/dL (H)). Liver Function Tests: Recent Labs  Lab 04/15/22 1155 04/17/22 0045 04/18/22 0050 04/20/22 0416  AST 22 30 75* 63*  ALT '12 19 28 '$ 49*  ALKPHOS 116 104 99 104  BILITOT 1.0 0.9 3.2* 1.9*  PROT 7.8 8.2* 7.9 7.4  ALBUMIN 3.4* 3.1* 2.8* 2.4*   Recent Labs  Lab 04/17/22 0045  LIPASE 60*   No results for input(s): "AMMONIA" in the last 168 hours. Coagulation Profile: No results for input(s): "INR", "PROTIME" in the last 168 hours. Cardiac Enzymes: No results for input(s): "CKTOTAL", "CKMB", "CKMBINDEX", "TROPONINI" in the last 168 hours. BNP (last 3 results) No results for input(s): "PROBNP" in the last 8760 hours. HbA1C: No results for input(s): "HGBA1C" in the last 72 hours. CBG: Recent Labs  Lab 04/20/22 0606 04/20/22 1144 04/20/22 1558 04/20/22 2130 04/21/22 0643  GLUCAP 231* 213* 235* 196* 84   Lipid Profile: No results for input(s): "CHOL", "HDL", "LDLCALC", "TRIG", "CHOLHDL", "LDLDIRECT" in the last 72 hours. Thyroid Function Tests: No results for input(s): "TSH", "T4TOTAL", "FREET4", "T3FREE", "THYROIDAB" in the last 72 hours. Anemia Panel: No results for input(s): "VITAMINB12", "FOLATE", "FERRITIN", "TIBC", "IRON", "RETICCTPCT" in the last 72 hours. Sepsis Labs: Recent Labs  Lab 04/17/22 0240  PROCALCITON 0.69    Recent Results (from the past 240 hour(s))  Resp panel by RT-PCR (RSV, Flu  A&B, Covid) Urine, Clean Catch     Status: None   Collection Time: 04/17/22  6:14 AM   Specimen: Urine, Clean Catch; Nasal Swab  Result Value Ref Range Status   SARS Coronavirus 2 by RT PCR NEGATIVE NEGATIVE Final   Influenza A by PCR NEGATIVE NEGATIVE Final   Influenza B by PCR NEGATIVE NEGATIVE Final    Comment: (NOTE) The Xpert Xpress SARS-CoV-2/FLU/RSV plus assay is intended as an aid in the diagnosis of influenza from Nasopharyngeal swab specimens and should not be used as a sole basis for treatment. Nasal washings and aspirates are unacceptable for Xpert Xpress SARS-CoV-2/FLU/RSV testing.  Fact Sheet for Patients: EntrepreneurPulse.com.au  Fact Sheet for Healthcare Providers: IncredibleEmployment.be  This  test is not yet approved or cleared by the [place] and has been authorized for detection and/or diagnosis of SARS-CoV-2 by FDA under an Emergency Use Authorization (EUA). This EUA will remain in effect (meaning this test can be used) for the duration of the COVID-19 declaration under Section 564(b)(1) of the Act, 21 U.S.C. section 360bbb-3(b)(1), unless the authorization is terminated or revoked.     Resp Syncytial Virus by PCR NEGATIVE NEGATIVE Final    Comment: (NOTE) Fact Sheet for Patients: EntrepreneurPulse.com.au  Fact Sheet for Healthcare Providers: IncredibleEmployment.be  This test is not yet approved or cleared by the [place] FDA and has been authorized for detection and/or diagnosis of SARS-CoV-2 by FDA under an Emergency Use Authorization (EUA). This EUA will remain in effect (meaning this test can be used) for the duration of the COVID-19 declaration under Section 564(b)(1) of the Act, 21 U.S.C. section 360bbb-3(b)(1), unless the authorization is terminated or revoked.  Performed at [facility], [place] [address], [place], [place] [address]   Respiratory (~20  pathogens) panel by PCR     Status: None   Collection Time: 04/17/22  7:19 AM   Specimen: Nasopharyngeal Swab; Respiratory  Result Value Ref Range Status   Adenovirus NOT DETECTED NOT DETECTED Final   Coronavirus 229E NOT DETECTED NOT DETECTED Final    Comment: (NOTE) The Coronavirus on the Respiratory Panel, DOES NOT test for the novel  Coronavirus (2019 nCoV)    Coronavirus HKU1 NOT DETECTED NOT DETECTED Final   Coronavirus NL63 NOT DETECTED NOT DETECTED Final   Coronavirus OC43 NOT DETECTED NOT DETECTED Final   Metapneumovirus NOT DETECTED NOT DETECTED Final   Rhinovirus / Enterovirus NOT DETECTED NOT DETECTED Final   Influenza A NOT DETECTED NOT DETECTED Final   Influenza B NOT DETECTED NOT DETECTED Final   Parainfluenza Virus 1 NOT DETECTED NOT DETECTED Final   Parainfluenza Virus 2 NOT DETECTED NOT DETECTED Final   Parainfluenza Virus 3 NOT DETECTED NOT DETECTED Final   Parainfluenza Virus 4 NOT DETECTED NOT DETECTED Final   Respiratory Syncytial Virus NOT DETECTED NOT DETECTED Final   Bordetella pertussis NOT DETECTED NOT DETECTED Final   Bordetella Parapertussis NOT DETECTED NOT DETECTED Final   Chlamydophila pneumoniae NOT DETECTED NOT DETECTED Final   Mycoplasma pneumoniae NOT DETECTED NOT DETECTED Final    Comment: Performed at [facility], [place]. [address], [place], [place] [address]  Culture, blood (Routine X 2) w Reflex to ID Panel     Status: Abnormal   Collection Time: 04/17/22  8:23 AM   Specimen: BLOOD  Result Value Ref Range Status   Specimen Description BLOOD RIGHT ANTECUBITAL  Final   Special Requests   Final    BOTTLES DRAWN AEROBIC AND ANAEROBIC Blood Culture results may not be optimal due to an inadequate volume of blood received in culture bottles   Culture  Setup Time   Final    GRAM POSITIVE COCCI IN CHAINS IN BOTH AEROBIC AND ANAEROBIC BOTTLES CRITICAL RESULT CALLED TO, READ BACK BY AND VERIFIED WITH: PHARMD LISA CURRAN ON 04/17/22 @ 2151 BY DRT     Culture (A)  Final    STREPTOCOCCUS PYOGENES HEALTH DEPARTMENT NOTIFIED Performed at [facility], [place] [address], [place], [place] [address]    Report Status 04/19/2022 FINAL  Final   Organism ID, Bacteria STREPTOCOCCUS PYOGENES  Final      Susceptibility   Streptococcus pyogenes - MIC*    PENICILLIN <=0.06  SENSITIVE Sensitive     CEFTRIAXONE <=0.12 SENSITIVE Sensitive     ERYTHROMYCIN <=0.12 SENSITIVE Sensitive     LEVOFLOXACIN 0.5 SENSITIVE Sensitive     VANCOMYCIN 0.5 SENSITIVE Sensitive     * STREPTOCOCCUS PYOGENES  Blood Culture ID Panel (Reflexed)     Status: Abnormal   Collection Time: 04/17/22  8:23 AM  Result Value Ref Range Status   Enterococcus faecalis NOT DETECTED NOT DETECTED Final   Enterococcus Faecium NOT DETECTED NOT DETECTED Final   Listeria monocytogenes NOT DETECTED NOT DETECTED Final   Staphylococcus species NOT DETECTED NOT DETECTED Final   Staphylococcus aureus (BCID) NOT DETECTED NOT DETECTED Final   Staphylococcus epidermidis NOT DETECTED NOT DETECTED Final   Staphylococcus lugdunensis NOT DETECTED NOT DETECTED Final   Streptococcus species DETECTED (A) NOT DETECTED Final    Comment: CRITICAL RESULT CALLED TO, READ BACK BY AND VERIFIED WITH: PHARMD LISA CURRAN ON 04/17/22 @ 2151 BY DRT    Streptococcus agalactiae NOT DETECTED NOT DETECTED Final   Streptococcus pneumoniae NOT DETECTED NOT DETECTED Final   Streptococcus pyogenes DETECTED (A) NOT DETECTED Final    Comment: CRITICAL RESULT CALLED TO, READ BACK BY AND VERIFIED WITH: PHARMD LISA CURRAN ON 04/17/22 @ 2151 BY DRT    A.calcoaceticus-baumannii NOT DETECTED NOT DETECTED Final   Bacteroides fragilis NOT DETECTED NOT DETECTED Final   Enterobacterales NOT DETECTED NOT DETECTED Final   Enterobacter cloacae complex NOT DETECTED NOT DETECTED Final   Escherichia coli NOT DETECTED NOT DETECTED Final   Klebsiella aerogenes NOT DETECTED NOT DETECTED Final   Klebsiella oxytoca NOT DETECTED NOT  DETECTED Final   Klebsiella pneumoniae NOT DETECTED NOT DETECTED Final   Proteus species NOT DETECTED NOT DETECTED Final   Salmonella species NOT DETECTED NOT DETECTED Final   Serratia marcescens NOT DETECTED NOT DETECTED Final   Haemophilus influenzae NOT DETECTED NOT DETECTED Final   Neisseria meningitidis NOT DETECTED NOT DETECTED Final   Pseudomonas aeruginosa NOT DETECTED NOT DETECTED Final   Stenotrophomonas maltophilia NOT DETECTED NOT DETECTED Final   Candida albicans NOT DETECTED NOT DETECTED Final   Candida auris NOT DETECTED NOT DETECTED Final   Candida glabrata NOT DETECTED NOT DETECTED Final   Candida krusei NOT DETECTED NOT DETECTED Final   Candida parapsilosis NOT DETECTED NOT DETECTED Final   Candida tropicalis NOT DETECTED NOT DETECTED Final   Cryptococcus neoformans/gattii NOT DETECTED NOT DETECTED Final    Comment: Performed at [facility], [place] [address], [place], [place] [address]  Culture, blood (Routine X 2) w Reflex to ID Panel     Status: Abnormal   Collection Time: 04/17/22  8:30 AM   Specimen: BLOOD LEFT FOREARM  Result Value Ref Range Status   Specimen Description BLOOD LEFT FOREARM  Final   Special Requests   Final    BOTTLES DRAWN AEROBIC AND ANAEROBIC Blood Culture adequate volume   Culture  Setup Time   Final    GRAM POSITIVE COCCI IN CHAINS IN BOTH AEROBIC AND ANAEROBIC BOTTLES CRITICAL VALUE NOTED.  VALUE IS CONSISTENT WITH PREVIOUSLY REPORTED AND CALLED VALUE.    Culture (A)  Final    STREPTOCOCCUS PYOGENES SUSCEPTIBILITIES PERFORMED ON PREVIOUS CULTURE WITHIN THE LAST 5 DAYS. Performed at [facility], [place] [address] [place], [place] [address]    Report Status 04/19/2022 FINAL  Final  Culture, blood (Routine X 2) w Reflex to ID Panel     Status: None (Preliminary result)   Collection Time: 04/19/22  1:23 AM   Specimen: BLOOD LEFT HAND  Result Value Ref Range Status   Specimen Description BLOOD LEFT HAND  Final   Special  Requests   Final    BOTTLES DRAWN AEROBIC ONLY Blood Culture results may not be optimal due to an inadequate volume of blood received in culture bottles   Culture   Final    NO GROWTH 1 DAY Performed at [facility], [place] [address] [place], [place] [address]    Report Status PENDING  Incomplete  Culture, blood (Routine X 2) w Reflex to ID Panel     Status: None (Preliminary result)   Collection Time: 04/19/22  1:25 AM   Specimen: BLOOD RIGHT HAND  Result Value Ref Range Status   Specimen Description BLOOD RIGHT HAND  Final   Special Requests   Final    BOTTLES DRAWN AEROBIC AND ANAEROBIC Blood Culture adequate volume   Culture   Final    NO GROWTH 1 DAY Performed at [facility], [place] [address], [place], [place] [address]    Report Status PENDING  Incomplete         Radiology Studies: CT ABDOMEN PELVIS W CONTRAST  Result Date: 04/21/2022 CLINICAL DATA:  Bowel obstruction suspected EXAM: CT ABDOMEN AND PELVIS WITH CONTRAST TECHNIQUE: Multidetector CT imaging of the abdomen and pelvis was performed using the standard protocol following bolus administration of intravenous contrast. RADIATION DOSE REDUCTION: This exam was performed according to the departmental dose-optimization program which includes automated exposure control, adjustment of the mA and/or kV according to patient size and/or use of iterative reconstruction technique. CONTRAST:  5m OMNIPAQUE IOHEXOL 350 MG/ML SOLN COMPARISON:  CT abdomen pelvis 04/17/2022 FINDINGS: Lower chest: Enlarged right atrium.  Coronary calcification. Hepatobiliary: No focal liver abnormality. No gallstones, gallbladder wall thickening, or pericholecystic fluid. No biliary dilatation. Pancreas: No focal lesion. Normal pancreatic contour. No surrounding inflammatory changes. No main pancreatic ductal dilatation. Spleen: Normal in size without focal abnormality.  Splenule noted. Adrenals/Urinary Tract: No adrenal nodule bilaterally.  Bilateral kidneys enhance symmetrically. No hydronephrosis. No hydroureter. The urinary bladder is unremarkable. On delayed imaging, there is no urothelial wall thickening and there are no filling defects in the opacified portions of the bilateral collecting systems or ureters. Stomach/Bowel: Stomach is within normal limits. No evidence of bowel wall thickening or dilatation. Lack of haustral along the transverse colon which may be due to chronic inflammation or constipation. Stool within the ascending and descending colon. Appendix appears normal. Vascular/Lymphatic: No abdominal aorta or iliac aneurysm. Moderate atherosclerotic plaque of the aorta and its branches. Multiple prominent retroperitoneal lymph nodes more prominent along the left periaortic region. No abdominal, pelvic, or inguinal lymphadenopathy. Reproductive: Prostate is unremarkable. Other: Interval development of trace simple free fluid and fat stranding of the left retroperitoneum along the ureter. No intraperitoneal free gas. No organized fluid collection. Musculoskeletal: Small fat containing umbilical hernia.  Bilateral gynecomastia. No suspicious lytic or blastic osseous lesions. No acute displaced fracture. Multilevel degenerative changes of the spine. IMPRESSION: Interval development of trace simple free fluid and fat stranding of the left retroperitoneum along the ureter. Associated multiple prominent retroperitoneal lymph nodes. Correlate with urinalysis to exclude infection. Recommend follow-up CT in 3 months to evaluate for resolution of prominent lymph nodes. Electronically Signed   By: MIven FinnM.D.   On: 04/21/2022 03:07   DG Abd Portable 1V  Result Date: 04/20/2022 CLINICAL DATA:  Vomiting EXAM: PORTABLE ABDOMEN - 1 VIEW COMPARISON:  Abdominal  x-ray 03/10/2003 FINDINGS: There is a dilated loop of air-filled colon in the central abdomen measuring up to 10 cm. Otherwise there is a paucity of colonic and small bowel gas. No  suspicious calcification. Lung bases are clear. Sternotomy wires are present. IMPRESSION: Dilated loop of air-filled colon in the central abdomen measuring up to 10 cm. This is nonspecific and could be due to ileus or obstruction. Please correlate clinically. Consider CT. Electronically Signed   By: Ronney Asters M.D.   On: 04/20/2022 20:19   CT MAXILLOFACIAL WO CONTRAST  Result Date: 04/19/2022 CLINICAL DATA:  Bacteremia and dental pain. Concern for dental infection. EXAM: CT MAXILLOFACIAL WITHOUT CONTRAST TECHNIQUE: Multidetector CT imaging of the maxillofacial structures was performed. Multiplanar CT image reconstructions were also generated. RADIATION DOSE REDUCTION: This exam was performed according to the departmental dose-optimization program which includes automated exposure control, adjustment of the mA and/or kV according to patient size and/or use of iterative reconstruction technique. COMPARISON:  Head MRI and head CT 04/12/2022 FINDINGS: Osseous: No acute fracture. Absent maxillary and largely absent mandibular dentition. Two small remaining teeth/teeth fragments in the mandibular left lateral incisor and canine region and one in the right mandibular premolar region. Mild periapical lucency surrounding the two left-sided teeth with possible mild overlying soft tissue swelling. No evidence of a drainable fluid collection on this unenhanced study. Orbits: Unremarkable. Sinuses: Minimal mucosal thickening in the paranasal sinuses. No fluid. Clear mastoid air cells and middle ear cavities. Soft tissues: Mild atherosclerotic calcification of the carotid bifurcations. Limited intracranial: No acute finding. IMPRESSION: Largely absent dentition. Mild periapical lucency surrounding the two remaining left-sided mandibular teeth fragments with possible mild overlying soft tissue swelling. No evidence of a drainable fluid collection on this unenhanced study. Electronically Signed   By: Logan Bores M.D.   On:  04/19/2022 18:13        Scheduled Meds:  artificial tears   Right Eye Q4H   aspirin EC  81 mg Oral Daily   famotidine  20 mg Oral Daily   insulin aspart  0-20 Units Subcutaneous TID WC   insulin aspart  0-5 Units Subcutaneous QHS   insulin aspart  8 Units Subcutaneous TID WC   insulin glargine-yfgn  50 Units Subcutaneous Daily   metoprolol tartrate  12.5 mg Oral BID   pantoprazole  40 mg Oral Daily   polyethylene glycol  17 g Oral Daily   rosuvastatin  20 mg Oral QPM   sodium chloride flush  3 mL Intravenous Q12H   Continuous Infusions:  heparin 1,950 Units/hr (04/21/22 0210)   penicillin G potassium 12 Million Units in dextrose 5 % 500 mL CONTINUOUS infusion 12 Million Units (04/21/22 0013)     LOS: 3 days   Time spent= 35 mins    Teryl Mcconaghy Arsenio Loader, MD [facility]  If 7PM-7AM, please contact night-coverage  04/21/2022, 8:56 AM

## 2022-04-21 NOTE — Progress Notes (Signed)
ANTICOAGULATION CONSULT NOTE - Follow Up Consult  Pharmacy Consult for Heparin Indication: pulmonary embolus  Allergies  Allergen Reactions   Empagliflozin Rash    Patient reports yeast.    Ibuprofen Other (See Comments)    Stomach pain    Patient Measurements: Height: '5\' 11"'$  (180.3 cm) Weight: (!) 139.5 kg (307 lb 8.7 oz) IBW/kg (Calculated) : 75.3 Heparin Dosing Weight: 107 kg  Vital Signs: Temp: 98.2 F (36.8 C) (03/11 1116) Temp Source: Oral (03/11 1116) BP: 126/68 (03/11 1116) Pulse Rate: 73 (03/11 0914)  Labs: Recent Labs     0000 04/19/22 0124 04/19/22 0125 04/19/22 1040 04/20/22 0416 04/20/22 1252 04/21/22 0046 04/21/22 1054  HGB   < >  --  12.9*  --  13.7  --  13.8  --   HCT  --   --  37.5*  --  39.1  --  39.2  --   PLT  --   --  148*  --  145*  --  165  --   HEPARINUNFRC  --   --   --    < > 0.33 0.58 0.80* 0.29*  CREATININE  --  2.36*  --   --  1.74*  --   --   --    < > = values in this interval not displayed.    Estimated Creatinine Clearance: 58 mL/min (A) (by C-G formula based on SCr of 1.74 mg/dL (H)).  Assessment: 67 y.o. male with Group A Strep bacteremia/PNA. V/Q suggestive of PE on 3/8. Pharmacy consulted for heparin dosing.   Heparin level is just subtherapeutic (0.29) on 1950 units/hr.  Infusion rate was decreased overnight when level was supratherapeutic (0.80) on 2100 units/hr. Previously therapeutic x 2 on 2100 units/hr.  Hgb stable, platelet count was in 140s > back into normal range.     Goal of Therapy:  Heparin level 0.3-0.7 units/ml Monitor platelets by anticoagulation protocol: Yes   Plan:  Increase heparin drip to 2050 units/hr Heparin level ~8 hrs after rate change. Daily heparin level and CBC. Noted plan for TEE this week.  Arty Baumgartner, [place] 04/21/2022,1:07 PM

## 2022-04-21 NOTE — Progress Notes (Signed)
Pt did not want to wear cpap at this time

## 2022-04-21 NOTE — Inpatient Diabetes Management (Signed)
Inpatient Diabetes Program Recommendations  AACE/ADA: New Consensus Statement on Inpatient Glycemic Control (2015)  Target Ranges:  Prepandial:   less than 140 mg/dL      Peak postprandial:   less than 180 mg/dL (1-2 hours)      Critically ill patients:  140 - 180 mg/dL    Latest Reference Range & Units 05/09/21 09:57 04/17/22 08:30  Hemoglobin A1C 4.8 - 5.6 % 11.0 (H) 9.7 (H)  232 mg/dl  (H): Data is abnormally high  Latest Reference Range & Units 04/20/22 06:06 04/20/22 11:44 04/20/22 15:58 04/20/22 21:30  Glucose-Capillary 70 - 99 mg/dL 231 (H)  7 units Novolog '@0614'$   8 units Novolog '@0800'$  213 (H)  15 units Novolog  50 units Semglee '@0921'$   235 (H)  15 units Novolog '@1800'$  196 (H)  (H): Data is abnormally high  Latest Reference Range & Units 04/21/22 06:43 04/21/22 11:32  Glucose-Capillary 70 - 99 mg/dL 84 66 (L)  (L): Data is abnormally low  Acute hypoxemic respiratory failure secondary to acute CHF exacerbation  Acute respiratory failure with hypoxia- PE   Home DM Meds: Humalog QID per SSI        Lantus 50 units Daily  Current Orders: Semglee 50 units Daily at [facility] 8 units TID with meals      Novolog Resistant Correction Scale/ SSI (0-20 units) TID AC + HS    ENDO: Dr. Buddy Duty with [facility] Last Seen February 2024   MD- Note Prednisone has been stopped.  Last dose given yesterday AM (03/10).  Hypoglycemic at 11:30am today (has not received any Novolog yet today)--Pt was also NPO this AM but now allowed PO diet.  Will NOT get Semglee 50 units this AM--Dose changed to QHS dosing  May consider Reducing the Semglee slightly to 45 units QHS    Per Review of last Endocrinology visit notes from 04/03/2022, pt listed as taking Lantus 70 units daily + Humalog 80 units with meals + 10 units for correction of high glucose (up to 270 units per day)    Met w/ pt this AM at bedside.  Reviewed home Insulin regimen.  Pt takes Lantus 50 units Daily +  Humalog QID per SSI (has copy of SSI at home).  Sees Dr. Buddy Duty with [facility] for diabetes management--last seen in Feb 2024--No changes made to regimen per pt report.  Last A1c was 9.8% (when he had it checked with Dr. Buddy Duty in Feb).  I reviewed with him that his current A1c is 9.7%.  Discussed with pt that his A1c should be closer to 7%.  Reviewed healthy CBG goals for home.  Pt stated he has all insulin and CBG meter supplies at home.  Checks CBGs at least QID so he can dose his Humalog.  Did not have any questions for me at this time.    --Will follow patient during hospitalization--  Wyn Quaker RN, MSN, [place] Diabetes Coordinator Inpatient Glycemic Control Team Team Pager: [phone] (8a-5p)

## 2022-04-21 NOTE — Progress Notes (Addendum)
[place] for Infectious Disease  Date of Admission:  04/16/2022   Total days of inpatient antibiotics 4  Principal Problem:   Acute respiratory failure with hypoxia (HCC) Active Problems:   Bacteremia   Severe tricuspid regurgitation   Pulmonary embolism (HCC)   RVF (right ventricular failure) (HCC)   AKI (acute kidney injury) ([place])          Assessment: 66 YM admitted with respiratory failure secondary to CHF exacerbation, strep pyogenes bacteremia: Presented with back pain and fever.   #Streptococcus pyogenes bacteremia  #Bilateral lower extremity wounds-do not appear infected #TV regurg on tte - CT chest showed no acute intrathoracic process.  Stable cardiomegaly. - CT L-spine showed no acute bony abnormality - CT abdomen pelvis no acute findings in abdomen pelvis. MRI L spine did not show discitis/osteo/epidural abscess - TTE showed TV regurg was torrential, due to severe leaflet malcoaptation - Ct max showed left sided mandibular teeth with overlying soft tissue swelling, no drainable fluid collection, this could be source of bacteremia.    04/21/22 patient complains of 2 days rue swelling no tenderness -- exam cordlike structure distal bicep   Recommendations: -cards planned tee given severe tr on tte; initially requested by id team for concern severe presentation of beta-hemolytic strep and high burden bacteremia -- pending for end of this week -duplex venous u/s rue r/o dvt -Continue penicillin     I have spent a total of 50 minutes of face-to-face and non-face-to-face time, excluding clinical staff time, preparing to see patient, ordering tests and/or medications, and provide counseling the patient      Microbiology:   Antibiotics: Pen 3/7-  Linezolid 3/7   SUBJECTIVE: Afebrile Rue swelling x2 days No other complaint   Review of Systems: Review of Systems  All other systems reviewed and are negative.    Scheduled Meds:   artificial tears   Right Eye Q4H   aspirin EC  81 mg Oral Daily   famotidine  20 mg Oral Daily   insulin aspart  0-20 Units Subcutaneous TID WC   insulin aspart  0-5 Units Subcutaneous QHS   insulin aspart  8 Units Subcutaneous TID WC   insulin glargine-yfgn  50 Units Subcutaneous Daily   metoprolol tartrate  12.5 mg Oral BID    morphine injection  2 mg Intravenous Once   pantoprazole  40 mg Oral Daily   polyethylene glycol  17 g Oral Daily   rosuvastatin  20 mg Oral QPM   sodium chloride flush  3 mL Intravenous Q12H   Continuous Infusions:  heparin 2,050 Units/hr (04/21/22 1307)   penicillin G potassium 12 Million Units in dextrose 5 % 500 mL CONTINUOUS infusion 12 Million Units (04/21/22 1402)   PRN Meds:.acetaminophen **OR** acetaminophen, guaiFENesin, hydrALAZINE, [COMPLETED] ipratropium-albuterol **FOLLOWED BY** ipratropium-albuterol, metoprolol tartrate, ondansetron **OR** ondansetron (ZOFRAN) IV, pregabalin, senna-docusate, traZODone Allergies  Allergen Reactions   Empagliflozin Rash    Patient reports yeast.    Ibuprofen Other (See Comments)    Stomach pain    OBJECTIVE: Vitals:   04/21/22 0009 04/21/22 0502 04/21/22 0914 04/21/22 1116  BP: 127/74 126/77 123/64 126/68  Pulse: 70 65 73   Resp: '20 12 16 14  '$ Temp: QA348G F (36.4 C) 97.6 F (36.4 C) 97.6 F (36.4 C) 98.2 F (36.8 C)  TempSrc: Oral Oral Oral Oral  SpO2: 99% 94% 95% 96%  Weight: (!) 139.5 kg     Height:  Body mass index is 42.89 kg/m.  Exam: General/constitutional: no distress, pleasant HEENT: Normocephalic, PER, Conj Clear, EOMI, Oropharynx clear Neck supple CV: rrr no mrg Lungs: clear to auscultation, normal respiratory effort Abd: Soft, Nontender Ext: bilateral LE chronic edema 2+; rue cordlike structure distal bicep no warmth/tenderness -- rue does appear a little more swollen than left UE Skin: No Rash Neuro: nonfocal MSK: generalized weakness      Lab Results Lab Results   Component Value Date   WBC 13.4 (H) 04/21/2022   HGB 13.8 04/21/2022   HCT 39.2 04/21/2022   MCV 98.5 04/21/2022   PLT 165 04/21/2022    Lab Results  Component Value Date   CREATININE 1.74 (H) 04/20/2022   BUN 37 (H) 04/20/2022   NA 130 (L) 04/20/2022   K 3.9 04/20/2022   CL 98 04/20/2022   CO2 22 04/20/2022    Lab Results  Component Value Date   ALT 49 (H) 04/20/2022   AST 63 (H) 04/20/2022   ALKPHOS 104 04/20/2022   BILITOT 1.9 (H) 04/20/2022        Jabier Mutton, [place] for Infectious Disease [facility] 04/21/2022, 3:21 PM

## 2022-04-21 NOTE — Progress Notes (Signed)
Rounding Note    Patient Name: [name] [name] Date of Encounter: 04/21/2022  [place] Cardiologist: Candee Furbish, MD   Subjective   Patient well appearing comfortable lying in bed. States that he's had an improvement of his back pain and shortness of breath. Currently denying any chest pain, palpitations, dizziness, cough. On 5L of O2.  Inpatient Medications    Scheduled Meds:  artificial tears   Right Eye Q4H   aspirin EC  81 mg Oral Daily   famotidine  20 mg Oral Daily   insulin aspart  0-20 Units Subcutaneous TID WC   insulin aspart  0-5 Units Subcutaneous QHS   insulin aspart  8 Units Subcutaneous TID WC   insulin glargine-yfgn  50 Units Subcutaneous Daily   metoprolol tartrate  12.5 mg Oral BID   pantoprazole  40 mg Oral Daily   polyethylene glycol  17 g Oral Daily   rosuvastatin  20 mg Oral QPM   sodium chloride flush  3 mL Intravenous Q12H   Continuous Infusions:  heparin 1,950 Units/hr (04/21/22 0927)   penicillin G potassium 12 Million Units in dextrose 5 % 500 mL CONTINUOUS infusion 41.7 mL/hr at 04/21/22 0915   PRN Meds: acetaminophen **OR** acetaminophen, guaiFENesin, hydrALAZINE, [COMPLETED] ipratropium-albuterol **FOLLOWED BY** ipratropium-albuterol, metoprolol tartrate, ondansetron **OR** ondansetron (ZOFRAN) IV, pregabalin, senna-docusate, traZODone   Vital Signs    Vitals:   04/20/22 1932 04/21/22 0009 04/21/22 0502 04/21/22 0914  BP: (!) 135/100 127/74 126/77 123/64  Pulse: 61 70 65 73  Resp: '20 20 12 16  '$ Temp: 97.6 F (36.4 C) 97.6 F (36.4 C) 97.6 F (36.4 C) 97.6 F (36.4 C)  TempSrc: Oral Oral Oral Oral  SpO2: 93% 99% 94% 95%  Weight:  (!) 139.5 kg    Height:        Intake/Output Summary (Last 24 hours) at 04/21/2022 0957 Last data filed at 04/21/2022 0929 Gross per 24 hour  Intake 1508.17 ml  Output 4050 ml  Net -2541.83 ml      04/21/2022   12:09 AM 04/20/2022    1:05 AM 04/19/2022   12:18 AM  Last 3 Weights  Weight  (lbs) 307 lb 8.7 oz 309 lb 4.9 oz 309 lb 4.9 oz  Weight (kg) 139.5 kg 140.3 kg 140.3 kg      Telemetry    NSR with frequent PVCs/PACs rate of 75 - Personally Reviewed  ECG    None new - Personally Reviewed  Physical Exam   GEN: No acute distress.   Neck: No JVD Cardiac: RRR, no murmurs, rubs, or gallops.  Respiratory: Clear to auscultation bilaterally. GI: Soft, nontender, non-distended  MS: 1+ edema  Neuro:  Nonfocal  Psych: Normal affect   Labs    High Sensitivity Troponin:   Recent Labs  Lab 04/17/22 0045 04/17/22 0240  TROPONINIHS 11 11     Chemistry Recent Labs  Lab 04/17/22 0045 04/17/22 0830 04/18/22 0050 04/19/22 0124 04/20/22 0416  NA 137  --  133* 129* 130*  K 3.8  --  4.5 4.3 3.9  CL 100  --  98 97* 98  CO2 23  --  20* 20* 22  GLUCOSE 217*  --  208* 262* 223*  BUN 24*  --  34* 42* 37*  CREATININE 1.62*   < > 2.95* 2.36* 1.74*  CALCIUM 9.2  --  8.4* 8.3* 8.7*  MG  --   --   --   --  2.1  PROT 8.2*  --  7.9  --  7.4  ALBUMIN 3.1*  --  2.8*  --  2.4*  AST 30  --  75*  --  63*  ALT 19  --  28  --  49*  ALKPHOS 104  --  99  --  104  BILITOT 0.9  --  3.2*  --  1.9*  GFRNONAA 46*   < > 22* 29* 42*  ANIONGAP 14  --  '15 12 10   '$ < > = values in this interval not displayed.    Lipids No results for input(s): "CHOL", "TRIG", "HDL", "LABVLDL", "LDLCALC", "CHOLHDL" in the last 168 hours.  Hematology Recent Labs  Lab 04/19/22 0125 04/20/22 0416 04/21/22 0046  WBC 14.3* 13.8* 13.4*  RBC 3.74* 3.94* 3.98*  HGB 12.9* 13.7 13.8  HCT 37.5* 39.1 39.2  MCV 100.3* 99.2 98.5  MCH 34.5* 34.8* 34.7*  MCHC 34.4 35.0 35.2  RDW 15.2 14.7 14.6  PLT 148* 145* 165   Thyroid No results for input(s): "TSH", "FREET4" in the last 168 hours.  BNP Recent Labs  Lab 04/17/22 0830 04/18/22 0050  BNP 1,006.1* 938.7*    DDimer No results for input(s): "DDIMER" in the last 168 hours.   Radiology    CT ABDOMEN PELVIS W CONTRAST  Result Date: 04/21/2022 CLINICAL  DATA:  Bowel obstruction suspected EXAM: CT ABDOMEN AND PELVIS WITH CONTRAST TECHNIQUE: Multidetector CT imaging of the abdomen and pelvis was performed using the standard protocol following bolus administration of intravenous contrast. RADIATION DOSE REDUCTION: This exam was performed according to the departmental dose-optimization program which includes automated exposure control, adjustment of the mA and/or kV according to patient size and/or use of iterative reconstruction technique. CONTRAST:  44m OMNIPAQUE IOHEXOL 350 MG/ML SOLN COMPARISON:  CT abdomen pelvis 04/17/2022 FINDINGS: Lower chest: Enlarged right atrium.  Coronary calcification. Hepatobiliary: No focal liver abnormality. No gallstones, gallbladder wall thickening, or pericholecystic fluid. No biliary dilatation. Pancreas: No focal lesion. Normal pancreatic contour. No surrounding inflammatory changes. No main pancreatic ductal dilatation. Spleen: Normal in size without focal abnormality.  Splenule noted. Adrenals/Urinary Tract: No adrenal nodule bilaterally. Bilateral kidneys enhance symmetrically. No hydronephrosis. No hydroureter. The urinary bladder is unremarkable. On delayed imaging, there is no urothelial wall thickening and there are no filling defects in the opacified portions of the bilateral collecting systems or ureters. Stomach/Bowel: Stomach is within normal limits. No evidence of bowel wall thickening or dilatation. Lack of haustral along the transverse colon which may be due to chronic inflammation or constipation. Stool within the ascending and descending colon. Appendix appears normal. Vascular/Lymphatic: No abdominal aorta or iliac aneurysm. Moderate atherosclerotic plaque of the aorta and its branches. Multiple prominent retroperitoneal lymph nodes more prominent along the left periaortic region. No abdominal, pelvic, or inguinal lymphadenopathy. Reproductive: Prostate is unremarkable. Other: Interval development of trace simple  free fluid and fat stranding of the left retroperitoneum along the ureter. No intraperitoneal free gas. No organized fluid collection. Musculoskeletal: Small fat containing umbilical hernia.  Bilateral gynecomastia. No suspicious lytic or blastic osseous lesions. No acute displaced fracture. Multilevel degenerative changes of the spine. IMPRESSION: Interval development of trace simple free fluid and fat stranding of the left retroperitoneum along the ureter. Associated multiple prominent retroperitoneal lymph nodes. Correlate with urinalysis to exclude infection. Recommend follow-up CT in 3 months to evaluate for resolution of prominent lymph nodes. Electronically Signed   By: MIven FinnM.D.   On: 04/21/2022 03:07   DG Abd Portable 1V  Result Date:  04/20/2022 CLINICAL DATA:  Vomiting EXAM: PORTABLE ABDOMEN - 1 VIEW COMPARISON:  Abdominal x-ray 03/10/2003 FINDINGS: There is a dilated loop of air-filled colon in the central abdomen measuring up to 10 cm. Otherwise there is a paucity of colonic and small bowel gas. No suspicious calcification. Lung bases are clear. Sternotomy wires are present. IMPRESSION: Dilated loop of air-filled colon in the central abdomen measuring up to 10 cm. This is nonspecific and could be due to ileus or obstruction. Please correlate clinically. Consider CT. Electronically Signed   By: Ronney Asters M.D.   On: 04/20/2022 20:19   CT MAXILLOFACIAL WO CONTRAST  Result Date: 04/19/2022 CLINICAL DATA:  Bacteremia and dental pain. Concern for dental infection. EXAM: CT MAXILLOFACIAL WITHOUT CONTRAST TECHNIQUE: Multidetector CT imaging of the maxillofacial structures was performed. Multiplanar CT image reconstructions were also generated. RADIATION DOSE REDUCTION: This exam was performed according to the departmental dose-optimization program which includes automated exposure control, adjustment of the mA and/or kV according to patient size and/or use of iterative reconstruction  technique. COMPARISON:  Head MRI and head CT 04/12/2022 FINDINGS: Osseous: No acute fracture. Absent maxillary and largely absent mandibular dentition. Two small remaining teeth/teeth fragments in the mandibular left lateral incisor and canine region and one in the right mandibular premolar region. Mild periapical lucency surrounding the two left-sided teeth with possible mild overlying soft tissue swelling. No evidence of a drainable fluid collection on this unenhanced study. Orbits: Unremarkable. Sinuses: Minimal mucosal thickening in the paranasal sinuses. No fluid. Clear mastoid air cells and middle ear cavities. Soft tissues: Mild atherosclerotic calcification of the carotid bifurcations. Limited intracranial: No acute finding. IMPRESSION: Largely absent dentition. Mild periapical lucency surrounding the two remaining left-sided mandibular teeth fragments with possible mild overlying soft tissue swelling. No evidence of a drainable fluid collection on this unenhanced study. Electronically Signed   By: Logan Bores M.D.   On: 04/19/2022 18:13    Cardiac Studies   Echo 04/17/22: 1. Left ventricular ejection fraction, by estimation, is 55 to 60%. The  left ventricle has normal function. The left ventricle has no regional  wall motion abnormalities. Left ventricular diastolic parameters were  normal. There is the interventricular  septum is flattened in diastole ('D' shaped left ventricle), consistent  with right ventricular volume overload.   2. Right ventricular systolic function is severely reduced. The right  ventricular size is moderately enlarged. There is normal pulmonary artery  systolic pressure. The estimated right ventricular systolic pressure is  [id number] mmHg, but this may be severely  underestimated in the setting of torrential tricuspid regurgitation.   3. Left atrial size was mildly dilated.   4. Right atrial size was severely dilated.   5. The mitral valve is normal in structure. Mild  mitral valve  regurgitation.   6. Tricuspid valve regurgitation is torrential, due to severe leaflet  malcoaptation.   7. The aortic valve is tricuspid. There is mild calcification of the  aortic valve. Aortic valve regurgitation is not visualized. Aortic valve  sclerosis/calcification is present, without any evidence of aortic  stenosis.   Patient Profile     67 y.o. male  hx of CAD s/p CABG [id number], chronic diastolic heart failure, hypertension, liver cirrhosis, hypokalemia, OSA, RBBB, recent COVID infection history of ectopic atrial rhythm, CKD III, hyperlipidemia, morbid obesity, DM2 , PVD, who was admitted 04/16/2022 for the evaluation of acute hypoxia with right sided heart failure at the request of Dr. Florene Glen.  Now with Streptococcus pyogenes bacteremia with unclear  source and new onset of severe tricuspid regurgitation.    Assessment & Plan    New onset right-sided heart failure preserved EF with CKD stage III Severe tricuspid regurgitation  Started on treatment for heart failure and received Lasix then became hypotensive and had decline in renal function in the emergency department. BNP 1000, chest x-ray with congestion but no edema. Since then diuretics and eplerinone have been withheld with some improvement of kidney function, but no updated labs this morning. Previously was being managed on 40 mg of torsemide daily, eplerenone 25 mg daily, and potassium supplementation.  He is intolerant to SGLT2 inhibitors due to prior yeast infection.  He has been followed by Dr. Marlou Porch and sees him regularly.  He is supposed to be on 2 L of oxygen at home but is noncompliant. Not overtly fluid overloaded, but has 1+ BLE edema. Baseline weight unchanged since admission. Unable to maximize GDMT due to previous contraindications and declining renal function. Plan to schedule TEE to assess further.  - continue metoprolol 12.5 mg - TEE not scheduled yet. Currently does not appear patient would tolerate  procedure since unable to lie flat without discomfort - no history of IVDU, recent COVID infection. - on 5L [place] with home requirements of 2L, but non compliant at home.   Acute respiratory failure with hypoxia secondary to pulmonary embolism Will eventually need to transition to eliquis,  - currently on heparin infusion. No signs of major or minor bleeding.  - neg for DVT 04/18/22 - 5L o2 [place]  Strep pyogenes bacteremia - TEE pending to investigate endocarditis per ID - on pen G  CAD s/p CABG x 4 in 2005 Aortic atherosclerosis  HLD with LDL goal <70 - continue 81 mg aspirin and rosuvastatin '20mg'$  - denies any chest pain on this admission  HTN See above in the context of CHF management.  OSA - Previously deferred, but is now amenable to restarting CPAP.   For questions or updates, please contact [place] Please consult [email or web address] for contact info under        Signed, Bonnee Quin, PA-C  04/21/2022, 9:57 AM

## 2022-04-21 NOTE — Care Management Important Message (Signed)
Important Message  Patient Details  Name: [name] [name] MRN: [id number] Date of Birth: 1955/12/03   Medicare Important Message Given:  Yes     Shelda Altes 04/21/2022, 8:42 AM

## 2022-04-21 NOTE — Consult Note (Signed)
   [facility] CM Inpatient Consult   04/21/2022  [name] [name] 04/05/1955 [id number]  Orientation with [name], [facility] Liaison for review.   Location: [facility] Liaison visited pt at bedside [name])   [facility]) [place] Patient: Insurance [facility] Medicare)   Primary Care Provider: [name], MD at [facility]   Patient currently active with [facility] RN care coordinator. [facility] liaison will collaborate with the involved [facility] RN on pt's admission and current discharge disposition however will continue to follow discharge plans and update [facility] RN care manager accordingly with noted changes. Spoke with pt at bedside who confirms ongoing support system.  [place] does not replace or interfere with any arrangements made by the Inpatient Transition of Care team.   For questions contact:    Raina Mina, RN, [place] Hours M-F 8:00 am to 5 pm [phone] mobile [phone] [Office toll free line][facility] Office Hours are M-F 8:30 - 5 pm 24 hour nurse advise line [phone] Conceirge  [email or web address]

## 2022-04-21 NOTE — Progress Notes (Signed)
ANTICOAGULATION CONSULT NOTE - Follow Up Consult  Pharmacy Consult for Heparin Indication: pulmonary embolus  Allergies  Allergen Reactions   Empagliflozin Rash    Patient reports yeast.    Ibuprofen Other (See Comments)    Stomach pain    Patient Measurements: Height: '5\' 11"'$  (180.3 cm) Weight: (!) 139.5 kg (307 lb 8.7 oz) IBW/kg (Calculated) : 75.3 Heparin Dosing Weight: 107 kg  Vital Signs: Temp: 98 F (36.7 C) (03/11 1936) Temp Source: Oral (03/11 1936) BP: 122/77 (03/11 2000) Pulse Rate: 80 (03/11 2000)  Labs: Recent Labs     0000 04/19/22 0124 04/19/22 0125 04/19/22 1040 04/20/22 0416 04/20/22 1252 04/21/22 0046 04/21/22 1054 04/21/22 2052  HGB   < >  --  12.9*  --  13.7  --  13.8  --   --   HCT  --   --  37.5*  --  39.1  --  39.2  --   --   PLT  --   --  148*  --  145*  --  165  --   --   HEPARINUNFRC  --   --   --    < > 0.33   < > 0.80* 0.29* 0.46  CREATININE  --  2.36*  --   --  1.74*  --   --   --   --    < > = values in this interval not displayed.     Estimated Creatinine Clearance: 58 mL/min (A) (by C-G formula based on SCr of 1.74 mg/dL (H)).  Assessment: 67 y.o. male with Group A Strep bacteremia/PNA. V/Q suggestive of PE on 3/8. Pharmacy consulted for heparin dosing.   Heparin level is therapeutic (0.43) on 2050 units/hr.    Goal of Therapy:  Heparin level 0.3-0.7 units/ml Monitor platelets by anticoagulation protocol: Yes   Plan:  Continue heparin drip at 2050 units/hr Daily heparin level and CBC. Noted plan for TEE this week.  Alanda Slim, PharmD, [facility] Clinical Pharmacist Please see AMION for all Pharmacists' Contact Phone Numbers 04/21/2022, 9:47 PM

## 2022-04-21 NOTE — Progress Notes (Signed)
ANTICOAGULATION CONSULT NOTE  Pharmacy Consult for heparin Indication: pulmonary embolus  Allergies  Allergen Reactions   Empagliflozin Rash    Patient reports yeast.    Ibuprofen Other (See Comments)    Stomach pain    Patient Measurements: Height: '5\' 11"'$  (180.3 cm) Weight: (!) 139.5 kg (307 lb 8.7 oz) IBW/kg (Calculated) : 75.3 Heparin Dosing Weight: 107kg  Vital Signs: Temp: 97.6 F (36.4 C) (03/11 0009) Temp Source: Oral (03/11 0009) BP: 127/74 (03/11 0009) Pulse Rate: 70 (03/11 0009)  Labs: Recent Labs     0000 04/19/22 0124 04/19/22 0125 04/19/22 1040 04/20/22 0416 04/20/22 1252 04/21/22 0046  HGB   < >  --  12.9*  --  13.7  --  13.8  HCT  --   --  37.5*  --  39.1  --  39.2  PLT  --   --  148*  --  145*  --  165  HEPARINUNFRC  --   --   --    < > 0.33 0.58 0.80*  CREATININE  --  2.36*  --   --  1.74*  --   --    < > = values in this interval not displayed.     Estimated Creatinine Clearance: 58 mL/min (A) (by C-G formula based on SCr of 1.74 mg/dL (H)).  Assessment: 67 y.o. male with GAS bacteremia/PNA. V/Q showed PE on 3/8. Pharmacy consulted for heparin dosing.   Heparin level remains therapeutic at 0.58, on 2100 units/hr. Hgb 13.7, plt 145--stable. No s/sx of bleeding or infusion issues reported.   3/11 AM update:  Heparin level supra-therapeutic   Goal of Therapy:  Heparin level 0.3-0.7 units/ml Monitor platelets by anticoagulation protocol: Yes   Plan:  Dec heparin to 1950 units/hr 8 hour heparin level  Narda Bonds, PharmD, BCPS Clinical Pharmacist Phone: [phone]

## 2022-04-22 ENCOUNTER — Inpatient Hospital Stay (HOSPITAL_COMMUNITY): Payer: 59

## 2022-04-22 DIAGNOSIS — M7989 Other specified soft tissue disorders: Secondary | ICD-10-CM

## 2022-04-22 DIAGNOSIS — J9601 Acute respiratory failure with hypoxia: Secondary | ICD-10-CM | POA: Diagnosis not present

## 2022-04-22 DIAGNOSIS — N179 Acute kidney failure, unspecified: Secondary | ICD-10-CM | POA: Diagnosis not present

## 2022-04-22 DIAGNOSIS — I2609 Other pulmonary embolism with acute cor pulmonale: Secondary | ICD-10-CM | POA: Diagnosis not present

## 2022-04-22 LAB — GLUCOSE, CAPILLARY
Glucose-Capillary: 47 mg/dL — ABNORMAL LOW (ref 70–99)
Glucose-Capillary: 69 mg/dL — ABNORMAL LOW (ref 70–99)
Glucose-Capillary: 92 mg/dL (ref 70–99)
Glucose-Capillary: 132 mg/dL — ABNORMAL HIGH (ref 70–99)
Glucose-Capillary: 130 mg/dL — ABNORMAL HIGH (ref 70–99)
Glucose-Capillary: 124 mg/dL — ABNORMAL HIGH (ref 70–99)

## 2022-04-22 LAB — URINE CULTURE: Culture: NO GROWTH

## 2022-04-22 LAB — CBC
HCT: 41.6 % (ref 39.0–52.0)
Hemoglobin: 13.8 g/dL (ref 13.0–17.0)
MCH: 33.4 pg (ref 26.0–34.0)
MCHC: 33.2 g/dL (ref 30.0–36.0)
MCV: 100.7 fL — ABNORMAL HIGH (ref 80.0–100.0)
Platelets: 188 10*3/uL (ref 150–400)
RBC: 4.13 MIL/uL — ABNORMAL LOW (ref 4.22–5.81)
RDW: 14.8 % (ref 11.5–15.5)
WBC: 12.2 10*3/uL — ABNORMAL HIGH (ref 4.0–10.5)
nRBC: 2.1 % — ABNORMAL HIGH (ref 0.0–0.2)

## 2022-04-22 LAB — COMPREHENSIVE METABOLIC PANEL
ALT: 33 U/L (ref 0–44)
AST: 34 U/L (ref 15–41)
Albumin: 2.3 g/dL — ABNORMAL LOW (ref 3.5–5.0)
Alkaline Phosphatase: 141 U/L — ABNORMAL HIGH (ref 38–126)
Anion gap: 9 (ref 5–15)
BUN: 19 mg/dL (ref 8–23)
CO2: 28 mmol/L (ref 22–32)
Calcium: 8.7 mg/dL — ABNORMAL LOW (ref 8.9–10.3)
Chloride: 99 mmol/L (ref 98–111)
Creatinine, Ser: 1.51 mg/dL — ABNORMAL HIGH (ref 0.61–1.24)
GFR, Estimated: 50 mL/min — ABNORMAL LOW (ref >60)
Glucose, Bld: 51 mg/dL — ABNORMAL LOW (ref 70–99)
Potassium: 3.5 mmol/L (ref 3.5–5.1)
Sodium: 136 mmol/L (ref 135–145)
Total Bilirubin: 1.3 mg/dL — ABNORMAL HIGH (ref 0.3–1.2)
Total Protein: 7.5 g/dL (ref 6.5–8.1)

## 2022-04-22 LAB — MAGNESIUM: Magnesium: 1.7 mg/dL (ref 1.7–2.4)

## 2022-04-22 LAB — HEPARIN LEVEL (UNFRACTIONATED): Heparin Unfractionated: 0.39 IU/mL (ref 0.30–0.70)

## 2022-04-22 MED ORDER — INSULIN ASPART 100 UNIT/ML IJ SOLN
4.0000 [IU] | Freq: Three times a day (TID) | INTRAMUSCULAR | Status: DC
  Administered 2022-04-23 – 2022-04-26 (×7): 4 [IU] via SUBCUTANEOUS

## 2022-04-22 MED ORDER — INSULIN GLARGINE-YFGN 100 UNIT/ML ~~LOC~~ SOLN
30.0000 [IU] | Freq: Every day | SUBCUTANEOUS | Status: DC
  Administered 2022-04-22: 30 [IU] via SUBCUTANEOUS
  Filled 2022-04-22 (×2): qty 0.3

## 2022-04-22 MED ORDER — DEXTROSE 50 % IV SOLN
INTRAVENOUS | Status: AC
  Filled 2022-04-22: qty 50

## 2022-04-22 MED ORDER — FUROSEMIDE 10 MG/ML IJ SOLN: 80.0000 mg | Freq: Once | INTRAMUSCULAR | Status: DC

## 2022-04-22 MED ORDER — DICLOFENAC SODIUM 1 % EX GEL
2.0000 g | CUTANEOUS | Status: DC | PRN
  Administered 2022-04-22 – 2022-04-26 (×7): 2 g via TOPICAL
  Filled 2022-04-22 (×2): qty 100

## 2022-04-22 NOTE — Evaluation (Signed)
Physical Therapy Evaluation Patient Details Name: [name] [name] MRN: [id number] DOB: [birth year] Today's Date: 04/22/2022  History of Present Illness  Pt is a 67yo male who came to ED with back pain. Pt found to have R upper lobe PE, now on heparin drip. PMH:CAD s/p CABG in [id number], diastolic dysfunction, cirrhosis of the liver, HTN, CKD 3A, DM II, sleep apnea, R bundle branch block, obesity, peripheral neuropathy from DM, chronic LE edema, recent Bell's Palsy   Clinical Impression  Pt admitted with above. Pt reports ambulating without AD PTA, sponge bathing due to not being able to get into tub, drives, and does grocery shopping. Pt now requiring modA for sit to stand and requires use of rolling walker for safe ambulation. Pt to benefit from rollator for safe ambulation at this time but will need an extra wide rollator due to body habitus. Recommend HHPT to help patient progress back to amb without AD. Acute PT to cont to follow.       Recommendations for follow up therapy are one component of a multi-disciplinary discharge planning process, led by the attending physician.  Recommendations may be updated based on patient status, additional functional criteria and insurance authorization.  Follow Up Recommendations Home health PT      Assistance Recommended at Discharge Frequent or constant Supervision/Assistance  Patient can return home with the following  A little help with walking and/or transfers;A little help with bathing/dressing/bathroom;Help with stairs or ramp for entrance    Equipment Recommendations Rollator (4 wheels);BSC/3in1 (bariatric rollator vs wide rollator)  Recommendations for Other Services       Functional Status Assessment Patient has had a recent decline in their functional status and demonstrates the ability to make significant improvements in function in a reasonable and predictable amount of time.     Precautions / Restrictions Precautions Precautions:  Fall Precaution Comments: watch SPO2 Restrictions Weight Bearing Restrictions: No      Mobility  Bed Mobility Overal bed mobility:  (pt received sitting in chair)             General bed mobility comments: pt received sitting inc hair    Transfers Overall transfer level: Needs assistance Equipment used: Rolling walker (2 wheels) Transfers: Sit to/from Stand Sit to Stand: Mod assist           General transfer comment: due to body habitus pt unable to bring trunk forward in chair from reclined position, modA required from PT, minA to power up from chair, pt required use of rocking momentum to get up into standing    Ambulation/Gait Ambulation/Gait assistance: Min guard Gait Distance (Feet): 60 Feet Assistive device: Rolling walker (2 wheels) Gait Pattern/deviations: Step-to pattern, Decreased stride length, Wide base of support Gait velocity: dec Gait velocity interpretation: <1.31 ft/sec, indicative of household ambulator   General Gait Details: pt with c/o of his scrotum being stuck between his legs constantly trying to readjust t/o ambulation. pt with noted SOB, SPO2 at 92% on 2Lo2 via [place]  Stairs            Wheelchair Mobility    Modified Rankin (Stroke Patients Only)       Balance Overall balance assessment: Needs assistance Sitting-balance support: Feet supported, No upper extremity supported Sitting balance-Leahy Scale: Fair Sitting balance - Comments: pt unable to reach toes   Standing balance support: Bilateral upper extremity supported, During functional activity, Reliant on assistive device for balance Standing balance-Leahy Scale: Poor Standing balance comment: reliant on RW for support  Pertinent Vitals/Pain Pain Assessment Pain Assessment: Faces Faces Pain Scale: Hurts little more Pain Location: back    Home Living Family/patient expects to be discharged to:: Private residence Living  Arrangements: Spouse/significant other Available Help at Discharge: Family;Available 24 hours/day Type of Home: Apartment Home Access: Level entry       Home Layout: One level Home Equipment:  (pt reports his rollator is broken, that the leg fell off)      Prior Function Prior Level of Function : Independent/Modified Independent;Driving             Mobility Comments: pt reports ambulating without AD, drives, limited ambulation due to SOB ADLs Comments: pt reports sponge bathing and wife to assist with donning socks and shoes     Hand Dominance   Dominant Hand: Right    Extremity/Trunk Assessment   Upper Extremity Assessment Upper Extremity Assessment: Generalized weakness    Lower Extremity Assessment Lower Extremity Assessment: Generalized weakness    Cervical / Trunk Assessment Cervical / Trunk Assessment: Other exceptions Cervical / Trunk Exceptions: morbidly obese, distended abdomen  Communication   Communication: No difficulties  Cognition Arousal/Alertness: Awake/alert Behavior During Therapy: WFL for tasks assessed/performed Overall Cognitive Status: Within Functional Limits for tasks assessed                                 General Comments: pt sleeping in chair upon PT arrival. Pt able to follow commands        General Comments General comments (skin integrity, edema, etc.): pt with distended abdomen, swelling in bilat LEs, VSS, SpO2 at 92% on 2LO2 via [place]    Exercises     Assessment/Plan    PT Assessment Patient needs continued PT services  PT Problem List Decreased strength;Decreased activity tolerance;Decreased balance;Decreased mobility;Decreased knowledge of use of DME;Obesity;Cardiopulmonary status limiting activity       PT Treatment Interventions DME instruction;Gait training;Functional mobility training;Therapeutic activities;Therapeutic exercise;Balance training    PT Goals (Current goals can be found in the Care Plan  section)  Acute Rehab PT Goals Patient Stated Goal: get back to how I was PT Goal Formulation: With patient Time For Goal Achievement: 05/06/22 Potential to Achieve Goals: Good    Frequency Min 3X/week     Co-evaluation               AM-PAC PT "6 Clicks" Mobility  Outcome Measure Help needed turning from your back to your side while in a flat bed without using bedrails?: A Little Help needed moving from lying on your back to sitting on the side of a flat bed without using bedrails?: A Little Help needed moving to and from a bed to a chair (including a wheelchair)?: A Little Help needed standing up from a chair using your arms (e.g., wheelchair or bedside chair)?: A Lot Help needed to walk in hospital room?: A Little Help needed climbing 3-5 steps with a railing? : A Lot 6 Click Score: 16    End of Session Equipment Utilized During Treatment: Gait belt Activity Tolerance: Patient tolerated treatment well Patient left: in chair;with call bell/phone within reach;with chair alarm set Nurse Communication: Mobility status PT Visit Diagnosis: Unsteadiness on feet (R26.81);Difficulty in walking, not elsewhere classified (R26.2)    Time: [id number] PT Time Calculation (min) (ACUTE ONLY): 24 min   Charges:   PT Evaluation $PT Eval Moderate Complexity: 1 Mod PT Treatments $Gait Training: 8-22 mins  Kittie Plater, PT, DPT Acute Rehabilitation Services Secure chat preferred Office #: [phone]   Berline Lopes 04/22/2022, 1:34 PM

## 2022-04-22 NOTE — Progress Notes (Signed)
Pt refusing cpap for the night. ?

## 2022-04-22 NOTE — Progress Notes (Signed)
Rounding Note    Patient Name: [name] [name] Date of Encounter: 04/22/2022  [facility] Cardiologist: Candee Furbish, MD   Subjective   Patient well appearing comfortable sitting in chair. States that he's has had improvement of his shortness of breath and leg swelling. He notes that he has new onset of a palpable cord like structure on the inside of his R bicep. Abdomen appears distended and tight. Currently denying any chest pain, palpitations, dizziness, cough. On 2L of O2.    Inpatient Medications    Scheduled Meds:  artificial tears   Right Eye Q4H   aspirin EC  81 mg Oral Daily   dextrose       famotidine  20 mg Oral Daily   insulin aspart  0-20 Units Subcutaneous TID WC   insulin aspart  0-5 Units Subcutaneous QHS   insulin aspart  4 Units Subcutaneous TID WC   insulin glargine-yfgn  30 Units Subcutaneous Daily   metoprolol tartrate  12.5 mg Oral BID    morphine injection  2 mg Intravenous Once   pantoprazole  40 mg Oral Daily   polyethylene glycol  17 g Oral Daily   rosuvastatin  20 mg Oral QPM   sodium chloride flush  3 mL Intravenous Q12H   Continuous Infusions:  heparin 2,050 Units/hr (04/22/22 0033)   penicillin G potassium 12 Million Units in dextrose 5 % 500 mL CONTINUOUS infusion 41.7 mL/hr at 04/22/22 0033   PRN Meds: acetaminophen **OR** acetaminophen, dextrose, guaiFENesin, hydrALAZINE, [COMPLETED] ipratropium-albuterol **FOLLOWED BY** ipratropium-albuterol, metoprolol tartrate, ondansetron **OR** ondansetron (ZOFRAN) IV, pregabalin, senna-docusate, traZODone   Vital Signs    Vitals:   04/22/22 0320 04/22/22 0333 04/22/22 0400 04/22/22 0800  BP: 124/72  130/76   Pulse: 74  73 78  Resp: '20  15 20  '$ Temp: 98.6 F (37 C)   98.4 F (36.9 C)  TempSrc: Oral   Oral  SpO2: 95%  94% 90%  Weight:  (!) 137.3 kg    Height:        Intake/Output Summary (Last 24 hours) at 04/22/2022 0859 Last data filed at 04/22/2022 0800 Gross per 24 hour   Intake 2707.37 ml  Output 3950 ml  Net -1242.63 ml      04/22/2022    3:33 AM 04/21/2022   12:09 AM 04/20/2022    1:05 AM  Last 3 Weights  Weight (lbs) 302 lb 11.1 oz 307 lb 8.7 oz 309 lb 4.9 oz  Weight (kg) 137.3 kg 139.5 kg 140.3 kg      Telemetry    NSR rate of 80- Personally Reviewed  ECG    None new - Personally Reviewed  Physical Exam   GEN: No acute distress.   Neck: No JVD Cardiac: RRR, no murmurs, rubs, or gallops.  Respiratory: Clear to auscultation bilaterally. GI: firm distended abdomen MS: 1+ edema in BLE. New palpable tender ropelike structure on the inside of R bicep. Neuro:  Nonfocal  Psych: Normal affect   Labs    High Sensitivity Troponin:   Recent Labs  Lab 04/17/22 0045 04/17/22 0240  TROPONINIHS 11 11     Chemistry Recent Labs  Lab 04/18/22 0050 04/19/22 0124 04/20/22 0416 04/22/22 0058  NA 133* 129* 130* 136  K 4.5 4.3 3.9 3.5  CL 98 97* 98 99  CO2 20* 20* 22 28  GLUCOSE 208* 262* 223* 51*  BUN 34* 42* 37* 19  CREATININE 2.95* 2.36* 1.74* 1.51*  CALCIUM 8.4* 8.3* 8.7*  8.7*  MG  --   --  2.1 1.7  PROT 7.9  --  7.4 7.5  ALBUMIN 2.8*  --  2.4* 2.3*  AST 75*  --  63* 34  ALT 28  --  49* 33  ALKPHOS 99  --  104 141*  BILITOT 3.2*  --  1.9* 1.3*  GFRNONAA 22* 29* 42* 50*  ANIONGAP '15 12 10 9    '$ Lipids No results for input(s): "CHOL", "TRIG", "HDL", "LABVLDL", "LDLCALC", "CHOLHDL" in the last 168 hours.  Hematology Recent Labs  Lab 04/20/22 0416 04/21/22 0046 04/22/22 0058  WBC 13.8* 13.4* 12.2*  RBC 3.94* 3.98* 4.13*  HGB 13.7 13.8 13.8  HCT 39.1 39.2 41.6  MCV 99.2 98.5 100.7*  MCH 34.8* 34.7* 33.4  MCHC 35.0 35.2 33.2  RDW 14.7 14.6 14.8  PLT 145* 165 188   Thyroid No results for input(s): "TSH", "FREET4" in the last 168 hours.  BNP Recent Labs  Lab 04/17/22 0830 04/18/22 0050  BNP 1,006.1* 938.7*    DDimer No results for input(s): "DDIMER" in the last 168 hours.   Radiology    CT ABDOMEN PELVIS W  CONTRAST  Result Date: 04/21/2022 CLINICAL DATA:  Bowel obstruction suspected EXAM: CT ABDOMEN AND PELVIS WITH CONTRAST TECHNIQUE: Multidetector CT imaging of the abdomen and pelvis was performed using the standard protocol following bolus administration of intravenous contrast. RADIATION DOSE REDUCTION: This exam was performed according to the departmental dose-optimization program which includes automated exposure control, adjustment of the mA and/or kV according to patient size and/or use of iterative reconstruction technique. CONTRAST:  60m OMNIPAQUE IOHEXOL 350 MG/ML SOLN COMPARISON:  CT abdomen pelvis 04/17/2022 FINDINGS: Lower chest: Enlarged right atrium.  Coronary calcification. Hepatobiliary: No focal liver abnormality. No gallstones, gallbladder wall thickening, or pericholecystic fluid. No biliary dilatation. Pancreas: No focal lesion. Normal pancreatic contour. No surrounding inflammatory changes. No main pancreatic ductal dilatation. Spleen: Normal in size without focal abnormality.  Splenule noted. Adrenals/Urinary Tract: No adrenal nodule bilaterally. Bilateral kidneys enhance symmetrically. No hydronephrosis. No hydroureter. The urinary bladder is unremarkable. On delayed imaging, there is no urothelial wall thickening and there are no filling defects in the opacified portions of the bilateral collecting systems or ureters. Stomach/Bowel: Stomach is within normal limits. No evidence of bowel wall thickening or dilatation. Lack of haustral along the transverse colon which may be due to chronic inflammation or constipation. Stool within the ascending and descending colon. Appendix appears normal. Vascular/Lymphatic: No abdominal aorta or iliac aneurysm. Moderate atherosclerotic plaque of the aorta and its branches. Multiple prominent retroperitoneal lymph nodes more prominent along the left periaortic region. No abdominal, pelvic, or inguinal lymphadenopathy. Reproductive: Prostate is unremarkable.  Other: Interval development of trace simple free fluid and fat stranding of the left retroperitoneum along the ureter. No intraperitoneal free gas. No organized fluid collection. Musculoskeletal: Small fat containing umbilical hernia.  Bilateral gynecomastia. No suspicious lytic or blastic osseous lesions. No acute displaced fracture. Multilevel degenerative changes of the spine. IMPRESSION: Interval development of trace simple free fluid and fat stranding of the left retroperitoneum along the ureter. Associated multiple prominent retroperitoneal lymph nodes. Correlate with urinalysis to exclude infection. Recommend follow-up CT in 3 months to evaluate for resolution of prominent lymph nodes. Electronically Signed   By: MIven FinnM.D.   On: 04/21/2022 03:07   DG Abd Portable 1V  Result Date: 04/20/2022 CLINICAL DATA:  Vomiting EXAM: PORTABLE ABDOMEN - 1 VIEW COMPARISON:  Abdominal x-ray 03/10/2003 FINDINGS: There is  a dilated loop of air-filled colon in the central abdomen measuring up to 10 cm. Otherwise there is a paucity of colonic and small bowel gas. No suspicious calcification. Lung bases are clear. Sternotomy wires are present. IMPRESSION: Dilated loop of air-filled colon in the central abdomen measuring up to 10 cm. This is nonspecific and could be due to ileus or obstruction. Please correlate clinically. Consider CT. Electronically Signed   By: Ronney Asters M.D.   On: 04/20/2022 20:19    Cardiac Studies   Echo 04/17/22: 1. Left ventricular ejection fraction, by estimation, is 55 to 60%. The  left ventricle has normal function. The left ventricle has no regional  wall motion abnormalities. Left ventricular diastolic parameters were  normal. There is the interventricular  septum is flattened in diastole ('D' shaped left ventricle), consistent  with right ventricular volume overload.   2. Right ventricular systolic function is severely reduced. The right  ventricular size is moderately  enlarged. There is normal pulmonary artery  systolic pressure. The estimated right ventricular systolic pressure is  [id number] mmHg, but this may be severely  underestimated in the setting of torrential tricuspid regurgitation.   3. Left atrial size was mildly dilated.   4. Right atrial size was severely dilated.   5. The mitral valve is normal in structure. Mild mitral valve  regurgitation.   6. Tricuspid valve regurgitation is torrential, due to severe leaflet  malcoaptation.   7. The aortic valve is tricuspid. There is mild calcification of the  aortic valve. Aortic valve regurgitation is not visualized. Aortic valve  sclerosis/calcification is present, without any evidence of aortic  stenosis.   Patient Profile     67 y.o. male  hx of CAD s/p CABG [id number], chronic diastolic heart failure, hypertension, liver cirrhosis, hypokalemia, OSA, RBBB, recent COVID infection history of ectopic atrial rhythm, CKD III, hyperlipidemia, morbid obesity, DM2 , PVD, who was admitted 04/16/2022 for the evaluation of acute hypoxia with right sided heart failure at the request of Dr. Florene Glen.  Now with Streptococcus pyogenes bacteremia with unclear source and new onset of severe tricuspid regurgitation.    Assessment & Plan    New onset right-sided heart failure preserved EF with CKD stage III Severe tricuspid regurgitation  Started on treatment for heart failure and received Lasix then became hypotensive and had decline in renal function in the emergency department. BNP 1000, chest x-ray with congestion but no edema. Since then diuretics and eplerinone have been withheld with some improvement of kidney function, but no updated labs this morning. Previously was being managed on 40 mg of torsemide daily, eplerenone 25 mg daily, and potassium supplementation.  He is intolerant to SGLT2 inhibitors due to prior yeast infection.  He has been followed by Dr. Marlou Porch and sees him regularly.  He is supposed to be on 2 L of  oxygen at home but is noncompliant at home. Unable to maximize GDMT due to previous contraindications and declining renal function. Plan to schedule TEE to assess further when able to tolerate procedure.   Patient has had an improvement in kidney function with a creatinine of 1.5 which appears to be his baseline.  He was given IV Lasix 80 mg once yesterday with improved renal function, diuresed down 3,450 mL. Today he reports improvement shortness of breath and oxygen requirements have been decreased to 2 L via nasal cannula.  He's down 4lbs since yesterday. Of note, he does have new distention of his abdomen that may be indicating he  still needs more diuresis in the setting of improving renal function. Will consult with attending about another IV Lasix dose.  Patient believes he would be able to lay flat for TEE.  Since today is the first time he has returned back to baseline kidney function.  Would like to start spironolactone when kidney function normalizes or before discharge.  - continue metoprolol 12.5 mg - TEE not scheduled yet.  - on 2 [place] with home requirements of 2L, but non compliant at home.  - adding PT eval and compression stockings    Acute respiratory failure with hypoxia secondary to pulmonary embolism Will eventually need to transition to eliquis.  New onset of palpable and tender ropelike structure on the right bicep.  Uncertain if this is a SVT, but will notify primary team. - currently on heparin infusion. No signs of major or minor bleeding.  - neg for DVT 04/18/22 - 2L o2 [place]   Strep pyogenes bacteremia - TEE pending to investigate endocarditis per ID - on pen G - no history of IVDU, recent COVID infection.   CAD s/p CABG x 4 in 2005 Aortic atherosclerosis  HLD with LDL goal <70 - continue 81 mg aspirin and rosuvastatin '20mg'$  - denies any chest pain on this admission   HTN See above in the context of CHF management.   OSA - Previously deferred, but is now amenable to  restarting CPAP.     For questions or updates, please contact [place] Please consult [email or web address] for contact info under        Signed, Bonnee Quin, PA-C  04/22/2022, 8:59 AM

## 2022-04-22 NOTE — Patient Outreach (Signed)
  Care Coordination   Documentiation   Visit Note   04/22/2022 Name: [name] [name] MRN: [id number] DOB: 05-18-1955  [name] [name] is a 68 y.o. year old male who sees [name], MD for primary care. I  reviewed patient chart.  Patient noted to be in the hospital currently.  Hospital liaison folowing for admission.  TOC nurse will outreach after discharge.    What matters to the patients health and wellness today?  N/A    Goals Addressed   None     SDOH assessments and interventions completed:  No     Care Coordination Interventions:  No, not indicated    Follow up plan: TOC nurse will follow up after discharge  Encounter Outcome:  chart reviewed  Jone Baseman, RN, MSN [facility] Care Management Coordinator Direct Line [phone]

## 2022-04-22 NOTE — Progress Notes (Signed)
Mobility Specialist - Progress Note   04/22/22 1000  Mobility  Activity Ambulated with assistance in hallway  Level of Assistance Contact guard assist, steadying assist  Assistive Device Front wheel walker  Distance Ambulated (ft) 40 ft  Activity Response Tolerated well  Mobility Referral Yes  $Mobility charge 1 Mobility    Pt received in recliner requesting to get up and walk. C/o back pain but tolerated distance well w/ only MinG assist required for safety. Left in recliner w/ chair alarm on and call bell at his side.   [place] Specialist Please contact via SecureChat or Rehab office at [phone]

## 2022-04-22 NOTE — Progress Notes (Signed)
PROGRESS NOTE    [name] [name]  [id number] DOB: 28-Jun-1955 DOA: 04/16/2022 PCP: [name], MD   Brief Narrative:  67 year old male with coronary artery disease status post CABG in [id number], diastolic heart failure, cirrhosis of liver, ectopic atrial rhythm, hypertension, CKD 3A, diabetes mellitus type 2, sleep apnea, morbid obesity, peripheral neuropathy and varicose veins who presented to the hospital for back pain and found to be hypoxemic.  He was started on treatment for heart failure.  After receiving Lasix Cozaar and Lopressor, he became hypotensive in the ED. VQ scan on 3 8 showed pulmonary embolism, cultures ended up growing streptococcal pyogenes.  ID and cardiology was consulted.  Currently on IV penicillin, planning for TEE on Friday.   Assessment & Plan:  Principal Problem:   Acute respiratory failure with hypoxia (HCC) Active Problems:   Bacteremia   Severe tricuspid regurgitation   Pulmonary embolism (HCC)   RVF (right ventricular failure) (HCC)   AKI (acute kidney injury) ([place])   Right upper lobe pulmonary embolism -VQ scan showed right upper lobe perfusion defect consistent with pulmonary embolism.  Currently on heparin drip.  Lower extremity Dopplers is negative for DVT  Streptococcus pyogenes bacteremia -Currently on penicillin G.  Echocardiogram shows severe TR.  ID and cardiology following.  CT scan showing multiple mandible lucencies.  TEE planned for Friday. Acute kidney injury - Baseline creatinine 1.7, peak during this admission at 2.9.  Creatinine is trending down.  Right AC mild swelling - Suspect either thrombophlebitis versus IV infiltration.  He is already on anticoagulation.  Warm compresses ordered.   Right-sided heart failure -Eplerenone on hold.  On Lopressor  Nausea vomiting - Suspicion for possible UTI, retroperitoneal lymphadenopathy.  Check UA.  Will need repeat CT scan in about 3 months which can be done by PCP.  Antiemetics as needed      Severe tricuspid regurgitation  - ? If vegetation or due to PE   Mild thrombocytopenia - Platelets 145 - Follow intermittently   Uncontrolled DM 2 with Hyperglycemia -Long-acting insulin, sliding scale and Accu-Chek.  Adjust as necessary   Bells Palsy Completed 4 days of p.o. prednisone   OSA Refusing CPAP   DVT prophylaxis: Place TED hose Start: 04/22/22 0909Heparin Drip Code Status: Full Family Communication:    Status is: Inpatient Remains inpatient appropriate because: Planned TEE.  Ongoing evaluation   Nutritional status          Body mass index is 42.22 kg/m.         Subjective:  Working with physical therapy, no complaints  Examination: Constitutional: Not in acute distress Respiratory: Clear to auscultation bilaterally Cardiovascular: Normal sinus rhythm, no rubs Abdomen: Nontender nondistended good bowel sounds Musculoskeletal: 1+ bilateral pitting edema.  Right AC slight swelling Skin: No rashes seen Neurologic: CN 2-12 grossly intact.  And nonfocal Psychiatric: Normal judgment and insight. Alert and oriented x 3. Normal mood.   Objective: Vitals:   04/22/22 0320 04/22/22 0333 04/22/22 0400 04/22/22 0800  BP: 124/72  130/76   Pulse: 74  73 78  Resp: '20  15 20  '$ Temp: 0000000 F (37 C)   98.4 F (36.9 C)  TempSrc: Oral   Oral  SpO2: 95%  94% 90%  Weight:  (!) 137.3 kg    Height:        Intake/Output Summary (Last 24 hours) at 04/22/2022 1132 Last data filed at 04/22/2022 0800 Gross per 24 hour  Intake 2071.44 ml  Output 3400 ml  Net -  1328.56 ml   Filed Weights   04/20/22 0105 04/21/22 0009 04/22/22 0333  Weight: (!) 140.3 kg (!) 139.5 kg (!) 137.3 kg     Data Reviewed:   CBC: Recent Labs  Lab 04/18/22 0050 04/19/22 0125 04/20/22 0416 04/21/22 0046 04/22/22 0058  WBC 17.5* 14.3* 13.8* 13.4* 12.2*  NEUTROABS  --   --  10.0*  --   --   HGB 14.0 12.9* 13.7 13.8 13.8  HCT 40.5 37.5* 39.1 39.2 41.6  MCV 102.3* 100.3*  99.2 98.5 100.7*  PLT 155 148* 145* 165 0000000   Basic Metabolic Panel: Recent Labs  Lab 04/17/22 0045 04/17/22 0830 04/18/22 0050 04/19/22 0124 04/20/22 0416 04/22/22 0058  NA 137  --  133* 129* 130* 136  K 3.8  --  4.5 4.3 3.9 3.5  CL 100  --  98 97* 98 99  CO2 23  --  20* 20* 22 28  GLUCOSE 217*  --  208* 262* 223* 51*  BUN 24*  --  34* 42* 37* 19  CREATININE 1.62* 1.47* 2.95* 2.36* 1.74* 1.51*  CALCIUM 9.2  --  8.4* 8.3* 8.7* 8.7*  MG  --   --   --   --  2.1 1.7  PHOS  --   --   --   --  2.5  --    GFR: Estimated Creatinine Clearance: 66.3 mL/min (A) (by C-G formula based on SCr of 1.51 mg/dL (H)). Liver Function Tests: Recent Labs  Lab 04/15/22 1155 04/17/22 0045 04/18/22 0050 04/20/22 0416 04/22/22 0058  AST 22 30 75* 63* 34  ALT '12 19 28 '$ 49* 33  ALKPHOS 116 104 99 104 141*  BILITOT 1.0 0.9 3.2* 1.9* 1.3*  PROT 7.8 8.2* 7.9 7.4 7.5  ALBUMIN 3.4* 3.1* 2.8* 2.4* 2.3*   Recent Labs  Lab 04/17/22 0045  LIPASE 60*   No results for input(s): "AMMONIA" in the last 168 hours. Coagulation Profile: No results for input(s): "INR", "PROTIME" in the last 168 hours. Cardiac Enzymes: No results for input(s): "CKTOTAL", "CKMB", "CKMBINDEX", "TROPONINI" in the last 168 hours. BNP (last 3 results) No results for input(s): "PROBNP" in the last 8760 hours. HbA1C: No results for input(s): "HGBA1C" in the last 72 hours. CBG: Recent Labs  Lab 04/21/22 1647 04/21/22 2111 04/22/22 0602 04/22/22 0622 04/22/22 0642  GLUCAP 151* 95 69* 47* 92   Lipid Profile: No results for input(s): "CHOL", "HDL", "LDLCALC", "TRIG", "CHOLHDL", "LDLDIRECT" in the last 72 hours. Thyroid Function Tests: No results for input(s): "TSH", "T4TOTAL", "FREET4", "T3FREE", "THYROIDAB" in the last 72 hours. Anemia Panel: No results for input(s): "VITAMINB12", "FOLATE", "FERRITIN", "TIBC", "IRON", "RETICCTPCT" in the last 72 hours. Sepsis Labs: Recent Labs  Lab 04/17/22 0240  PROCALCITON 0.69     Recent Results (from the past 240 hour(s))  Resp panel by RT-PCR (RSV, Flu A&B, Covid) Urine, Clean Catch     Status: None   Collection Time: 04/17/22  6:14 AM   Specimen: Urine, Clean Catch; Nasal Swab  Result Value Ref Range Status   SARS Coronavirus 2 by RT PCR NEGATIVE NEGATIVE Final   Influenza A by PCR NEGATIVE NEGATIVE Final   Influenza B by PCR NEGATIVE NEGATIVE Final    Comment: (NOTE) The Xpert Xpress SARS-CoV-2/FLU/RSV plus assay is intended as an aid in the diagnosis of influenza from Nasopharyngeal swab specimens and should not be used as a sole basis for treatment. Nasal washings and aspirates are unacceptable for Xpert Xpress SARS-CoV-2/FLU/RSV testing.  Fact Sheet for Patients: EntrepreneurPulse.com.au  Fact Sheet for Healthcare Providers: IncredibleEmployment.be  This test is not yet approved or cleared by the [place] FDA and has been authorized for detection and/or diagnosis of SARS-CoV-2 by FDA under an Emergency Use Authorization (EUA). This EUA will remain in effect (meaning this test can be used) for the duration of the COVID-19 declaration under Section 564(b)(1) of the Act, 21 U.S.C. section 360bbb-3(b)(1), unless the authorization is terminated or revoked.     Resp Syncytial Virus by PCR NEGATIVE NEGATIVE Final    Comment: (NOTE) Fact Sheet for Patients: EntrepreneurPulse.com.au  Fact Sheet for Healthcare Providers: IncredibleEmployment.be  This test is not yet approved or cleared by the [place] FDA and has been authorized for detection and/or diagnosis of SARS-CoV-2 by FDA under an Emergency Use Authorization (EUA). This EUA will remain in effect (meaning this test can be used) for the duration of the COVID-19 declaration under Section 564(b)(1) of the Act, 21 U.S.C. section 360bbb-3(b)(1), unless the authorization is terminated or revoked.  Performed at  [facility], [place] [address], [place], [place] [address]   Respiratory (~20 pathogens) panel by PCR     Status: None   Collection Time: 04/17/22  7:19 AM   Specimen: Nasopharyngeal Swab; Respiratory  Result Value Ref Range Status   Adenovirus NOT DETECTED NOT DETECTED Final   Coronavirus 229E NOT DETECTED NOT DETECTED Final    Comment: (NOTE) The Coronavirus on the Respiratory Panel, DOES NOT test for the novel  Coronavirus (2019 nCoV)    Coronavirus HKU1 NOT DETECTED NOT DETECTED Final   Coronavirus NL63 NOT DETECTED NOT DETECTED Final   Coronavirus OC43 NOT DETECTED NOT DETECTED Final   Metapneumovirus NOT DETECTED NOT DETECTED Final   Rhinovirus / Enterovirus NOT DETECTED NOT DETECTED Final   Influenza A NOT DETECTED NOT DETECTED Final   Influenza B NOT DETECTED NOT DETECTED Final   Parainfluenza Virus 1 NOT DETECTED NOT DETECTED Final   Parainfluenza Virus 2 NOT DETECTED NOT DETECTED Final   Parainfluenza Virus 3 NOT DETECTED NOT DETECTED Final   Parainfluenza Virus 4 NOT DETECTED NOT DETECTED Final   Respiratory Syncytial Virus NOT DETECTED NOT DETECTED Final   Bordetella pertussis NOT DETECTED NOT DETECTED Final   Bordetella Parapertussis NOT DETECTED NOT DETECTED Final   Chlamydophila pneumoniae NOT DETECTED NOT DETECTED Final   Mycoplasma pneumoniae NOT DETECTED NOT DETECTED Final    Comment: Performed at [facility], [place]. [address], [place], [place] [address]  Culture, blood (Routine X 2) w Reflex to ID Panel     Status: Abnormal   Collection Time: 04/17/22  8:23 AM   Specimen: BLOOD  Result Value Ref Range Status   Specimen Description BLOOD RIGHT ANTECUBITAL  Final   Special Requests   Final    BOTTLES DRAWN AEROBIC AND ANAEROBIC Blood Culture results may not be optimal due to an inadequate volume of blood received in culture bottles   Culture  Setup Time   Final    GRAM POSITIVE COCCI IN CHAINS IN BOTH AEROBIC AND ANAEROBIC BOTTLES CRITICAL RESULT  CALLED TO, READ BACK BY AND VERIFIED WITH: PHARMD LISA CURRAN ON 04/17/22 @ 2151 BY DRT    Culture (A)  Final    STREPTOCOCCUS PYOGENES HEALTH DEPARTMENT NOTIFIED Performed at [facility], [place] [address] [place], [place] [address]    Report Status 04/19/2022 FINAL  Final   Organism ID, Bacteria STREPTOCOCCUS PYOGENES  Final  Susceptibility   Streptococcus pyogenes - MIC*    PENICILLIN <=0.06 SENSITIVE Sensitive     CEFTRIAXONE <=0.12 SENSITIVE Sensitive     ERYTHROMYCIN <=0.12 SENSITIVE Sensitive     LEVOFLOXACIN 0.5 SENSITIVE Sensitive     VANCOMYCIN 0.5 SENSITIVE Sensitive     * STREPTOCOCCUS PYOGENES  Blood Culture ID Panel (Reflexed)     Status: Abnormal   Collection Time: 04/17/22  8:23 AM  Result Value Ref Range Status   Enterococcus faecalis NOT DETECTED NOT DETECTED Final   Enterococcus Faecium NOT DETECTED NOT DETECTED Final   Listeria monocytogenes NOT DETECTED NOT DETECTED Final   Staphylococcus species NOT DETECTED NOT DETECTED Final   Staphylococcus aureus (BCID) NOT DETECTED NOT DETECTED Final   Staphylococcus epidermidis NOT DETECTED NOT DETECTED Final   Staphylococcus lugdunensis NOT DETECTED NOT DETECTED Final   Streptococcus species DETECTED (A) NOT DETECTED Final    Comment: CRITICAL RESULT CALLED TO, READ BACK BY AND VERIFIED WITH: PHARMD LISA CURRAN ON 04/17/22 @ 2151 BY DRT    Streptococcus agalactiae NOT DETECTED NOT DETECTED Final   Streptococcus pneumoniae NOT DETECTED NOT DETECTED Final   Streptococcus pyogenes DETECTED (A) NOT DETECTED Final    Comment: CRITICAL RESULT CALLED TO, READ BACK BY AND VERIFIED WITH: PHARMD LISA CURRAN ON 04/17/22 @ 2151 BY DRT    A.calcoaceticus-baumannii NOT DETECTED NOT DETECTED Final   Bacteroides fragilis NOT DETECTED NOT DETECTED Final   Enterobacterales NOT DETECTED NOT DETECTED Final   Enterobacter cloacae complex NOT DETECTED NOT DETECTED Final   Escherichia coli NOT DETECTED NOT DETECTED Final    Klebsiella aerogenes NOT DETECTED NOT DETECTED Final   Klebsiella oxytoca NOT DETECTED NOT DETECTED Final   Klebsiella pneumoniae NOT DETECTED NOT DETECTED Final   Proteus species NOT DETECTED NOT DETECTED Final   Salmonella species NOT DETECTED NOT DETECTED Final   Serratia marcescens NOT DETECTED NOT DETECTED Final   Haemophilus influenzae NOT DETECTED NOT DETECTED Final   Neisseria meningitidis NOT DETECTED NOT DETECTED Final   Pseudomonas aeruginosa NOT DETECTED NOT DETECTED Final   Stenotrophomonas maltophilia NOT DETECTED NOT DETECTED Final   Candida albicans NOT DETECTED NOT DETECTED Final   Candida auris NOT DETECTED NOT DETECTED Final   Candida glabrata NOT DETECTED NOT DETECTED Final   Candida krusei NOT DETECTED NOT DETECTED Final   Candida parapsilosis NOT DETECTED NOT DETECTED Final   Candida tropicalis NOT DETECTED NOT DETECTED Final   Cryptococcus neoformans/gattii NOT DETECTED NOT DETECTED Final    Comment: Performed at [facility], [address], [place], [place] [address]  Culture, blood (Routine X 2) w Reflex to ID Panel     Status: Abnormal   Collection Time: 04/17/22  8:30 AM   Specimen: BLOOD LEFT FOREARM  Result Value Ref Range Status   Specimen Description BLOOD LEFT FOREARM  Final   Special Requests   Final    BOTTLES DRAWN AEROBIC AND ANAEROBIC Blood Culture adequate volume   Culture  Setup Time   Final    GRAM POSITIVE COCCI IN CHAINS IN BOTH AEROBIC AND ANAEROBIC BOTTLES CRITICAL VALUE NOTED.  VALUE IS CONSISTENT WITH PREVIOUSLY REPORTED AND CALLED VALUE.    Culture (A)  Final    STREPTOCOCCUS PYOGENES SUSCEPTIBILITIES PERFORMED ON PREVIOUS CULTURE WITHIN THE LAST 5 DAYS. Performed at [facility], [place] [address], [place], [place] [address]    Report Status 04/19/2022 FINAL  Final  Culture, blood (Routine X 2) w Reflex to ID Panel  Status: None (Preliminary result)   Collection Time: 04/19/22  1:23 AM   Specimen: BLOOD LEFT HAND   Result Value Ref Range Status   Specimen Description BLOOD LEFT HAND  Final   Special Requests   Final    BOTTLES DRAWN AEROBIC ONLY Blood Culture results may not be optimal due to an inadequate volume of blood received in culture bottles   Culture   Final    NO GROWTH 3 DAYS Performed at [facility], [place] [address] [place], [place] [address]    Report Status PENDING  Incomplete  Culture, blood (Routine X 2) w Reflex to ID Panel     Status: None (Preliminary result)   Collection Time: 04/19/22  1:25 AM   Specimen: BLOOD RIGHT HAND  Result Value Ref Range Status   Specimen Description BLOOD RIGHT HAND  Final   Special Requests   Final    BOTTLES DRAWN AEROBIC AND ANAEROBIC Blood Culture adequate volume   Culture   Final    NO GROWTH 3 DAYS Performed at [facility], [place] [address] [place], [place] [address]    Report Status PENDING  Incomplete  Urine Culture (for pregnant, neutropenic or urologic patients or patients with an indwelling urinary catheter)     Status: None   Collection Time: 04/21/22  3:13 AM   Specimen: Urine, Clean Catch  Result Value Ref Range Status   Specimen Description URINE, CLEAN CATCH  Final   Special Requests NONE  Final   Culture   Final    NO GROWTH Performed at [facility], [place] [address] [place], [place] [address]    Report Status 04/22/2022 FINAL  Final         Radiology Studies: CT ABDOMEN PELVIS W CONTRAST  Result Date: 04/21/2022 CLINICAL DATA:  Bowel obstruction suspected EXAM: CT ABDOMEN AND PELVIS WITH CONTRAST TECHNIQUE: Multidetector CT imaging of the abdomen and pelvis was performed using the standard protocol following bolus administration of intravenous contrast. RADIATION DOSE REDUCTION: This exam was performed according to the departmental dose-optimization program which includes automated exposure control, adjustment of the mA and/or kV according to patient size and/or use of iterative reconstruction  technique. CONTRAST:  13m OMNIPAQUE IOHEXOL 350 MG/ML SOLN COMPARISON:  CT abdomen pelvis 04/17/2022 FINDINGS: Lower chest: Enlarged right atrium.  Coronary calcification. Hepatobiliary: No focal liver abnormality. No gallstones, gallbladder wall thickening, or pericholecystic fluid. No biliary dilatation. Pancreas: No focal lesion. Normal pancreatic contour. No surrounding inflammatory changes. No main pancreatic ductal dilatation. Spleen: Normal in size without focal abnormality.  Splenule noted. Adrenals/Urinary Tract: No adrenal nodule bilaterally. Bilateral kidneys enhance symmetrically. No hydronephrosis. No hydroureter. The urinary bladder is unremarkable. On delayed imaging, there is no urothelial wall thickening and there are no filling defects in the opacified portions of the bilateral collecting systems or ureters. Stomach/Bowel: Stomach is within normal limits. No evidence of bowel wall thickening or dilatation. Lack of haustral along the transverse colon which may be due to chronic inflammation or constipation. Stool within the ascending and descending colon. Appendix appears normal. Vascular/Lymphatic: No abdominal aorta or iliac aneurysm. Moderate atherosclerotic plaque of the aorta and its branches. Multiple prominent retroperitoneal lymph nodes more prominent along the left periaortic region. No abdominal, pelvic, or inguinal lymphadenopathy. Reproductive: Prostate is unremarkable. Other: Interval development of trace simple free fluid and fat stranding of the left retroperitoneum along the ureter. No intraperitoneal free gas. No organized fluid collection. Musculoskeletal: Small fat containing umbilical hernia.  Bilateral gynecomastia. No suspicious lytic or blastic osseous lesions. No acute displaced fracture. Multilevel degenerative changes of the spine. IMPRESSION: Interval development of trace simple free fluid and fat stranding of the left retroperitoneum along the ureter. Associated multiple  prominent retroperitoneal lymph nodes. Correlate with urinalysis to exclude infection. Recommend follow-up CT in 3 months to evaluate for resolution of prominent lymph nodes. Electronically Signed   By: Iven Finn M.D.   On: 04/21/2022 03:07   DG Abd Portable 1V  Result Date: 04/20/2022 CLINICAL DATA:  Vomiting EXAM: PORTABLE ABDOMEN - 1 VIEW COMPARISON:  Abdominal x-ray 03/10/2003 FINDINGS: There is a dilated loop of air-filled colon in the central abdomen measuring up to 10 cm. Otherwise there is a paucity of colonic and small bowel gas. No suspicious calcification. Lung bases are clear. Sternotomy wires are present. IMPRESSION: Dilated loop of air-filled colon in the central abdomen measuring up to 10 cm. This is nonspecific and could be due to ileus or obstruction. Please correlate clinically. Consider CT. Electronically Signed   By: Ronney Asters M.D.   On: 04/20/2022 20:19        Scheduled Meds:  artificial tears   Right Eye Q4H   aspirin EC  81 mg Oral Daily   dextrose       famotidine  20 mg Oral Daily   insulin aspart  0-20 Units Subcutaneous TID WC   insulin aspart  0-5 Units Subcutaneous QHS   insulin aspart  4 Units Subcutaneous TID WC   insulin glargine-yfgn  30 Units Subcutaneous Daily   metoprolol tartrate  12.5 mg Oral BID    morphine injection  2 mg Intravenous Once   pantoprazole  40 mg Oral Daily   polyethylene glycol  17 g Oral Daily   rosuvastatin  20 mg Oral QPM   sodium chloride flush  3 mL Intravenous Q12H   Continuous Infusions:  heparin 2,050 Units/hr (04/22/22 1021)   penicillin G potassium 12 Million Units in dextrose 5 % 500 mL CONTINUOUS infusion 41.7 mL/hr at 04/22/22 0033     LOS: 4 days   Time spent= 35 mins    Nhia Heaphy Arsenio Loader, MD [facility]  If 7PM-7AM, please contact night-coverage  04/22/2022, 11:32 AM

## 2022-04-22 NOTE — Progress Notes (Signed)
Hypoglycemic Event  CBG: 69  Treatment: 4 oz juice/soda  Symptoms: None  Follow-up CBG: [id number] & [id number] CBG Result:47/ Second recheck: 48  Possible Reasons for Event: Inadequate meal intake  Comments/MD notified: Protocol used.     Angelica Pou

## 2022-04-22 NOTE — Progress Notes (Signed)
Right upper extremity venous study completed.   Preliminary results relayed to RN.  Please see CV Procedures for preliminary results.  Shantee Hayne, RVT  2:41 PM 04/22/22

## 2022-04-22 NOTE — Progress Notes (Signed)
ANTICOAGULATION CONSULT NOTE - Follow Up Consult  Pharmacy Consult for Heparin Indication: pulmonary embolus  Allergies  Allergen Reactions   Empagliflozin Rash    Patient reports yeast.    Ibuprofen Other (See Comments)    Stomach pain    Patient Measurements: Height: '5\' 11"'$  (180.3 cm) Weight: (!) 137.3 kg (302 lb 11.1 oz) IBW/kg (Calculated) : 75.3 Heparin Dosing Weight: 107 kg  Vital Signs: Temp: 98.6 F (37 C) (03/12 0320) Temp Source: Oral (03/12 0320) BP: 130/76 (03/12 0400) Pulse Rate: 73 (03/12 0400)  Labs: Recent Labs    04/20/22 0416 04/20/22 1252 04/21/22 0046 04/21/22 1054 04/21/22 2052 04/22/22 0058  HGB 13.7  --  13.8  --   --  13.8  HCT 39.1  --  39.2  --   --  41.6  PLT 145*  --  165  --   --  188  HEPARINUNFRC 0.33   < > 0.80* 0.29* 0.46 0.39  CREATININE 1.74*  --   --   --   --  1.51*   < > = values in this interval not displayed.     Estimated Creatinine Clearance: 66.3 mL/min (A) (by C-G formula based on SCr of 1.51 mg/dL (H)).  Assessment: 67 y.o. male with Group A Strep bacteremia/PNA. V/Q suggestive of PE on 3/8. Pharmacy consulted for heparin dosing.   Heparin level is therapeutic (0.39) on 2050 units/hr.    Goal of Therapy:  Heparin level 0.3-0.7 units/ml Monitor platelets by anticoagulation protocol: Yes   Plan:  Continue heparin drip at 2050 units/hr Daily heparin level and CBC. Noted plan for TEE this week.  Gyselle Matthew A. Levada Dy, PharmD, BCPS, FNKF Clinical Pharmacist [place] Please utilize Amion for appropriate phone number to reach the unit pharmacist ([place])

## 2022-04-23 ENCOUNTER — Other Ambulatory Visit (HOSPITAL_COMMUNITY): Payer: Self-pay

## 2022-04-23 ENCOUNTER — Encounter: Payer: Self-pay | Admitting: Family Medicine

## 2022-04-23 DIAGNOSIS — I50811 Acute right heart failure: Secondary | ICD-10-CM | POA: Diagnosis not present

## 2022-04-23 DIAGNOSIS — I2609 Other pulmonary embolism with acute cor pulmonale: Secondary | ICD-10-CM | POA: Diagnosis not present

## 2022-04-23 DIAGNOSIS — J9601 Acute respiratory failure with hypoxia: Secondary | ICD-10-CM | POA: Diagnosis not present

## 2022-04-23 LAB — GLUCOSE, CAPILLARY
Glucose-Capillary: 61 mg/dL — ABNORMAL LOW (ref 70–99)
Glucose-Capillary: 88 mg/dL (ref 70–99)
Glucose-Capillary: 78 mg/dL (ref 70–99)
Glucose-Capillary: 142 mg/dL — ABNORMAL HIGH (ref 70–99)
Glucose-Capillary: 148 mg/dL — ABNORMAL HIGH (ref 70–99)

## 2022-04-23 LAB — COMPREHENSIVE METABOLIC PANEL
ALT: 25 U/L (ref 0–44)
AST: 23 U/L (ref 15–41)
Albumin: 2.2 g/dL — ABNORMAL LOW (ref 3.5–5.0)
Alkaline Phosphatase: 145 U/L — ABNORMAL HIGH (ref 38–126)
Anion gap: 8 (ref 5–15)
BUN: 11 mg/dL (ref 8–23)
CO2: 27 mmol/L (ref 22–32)
Calcium: 8.6 mg/dL — ABNORMAL LOW (ref 8.9–10.3)
Chloride: 99 mmol/L (ref 98–111)
Creatinine, Ser: 1.25 mg/dL — ABNORMAL HIGH (ref 0.61–1.24)
GFR, Estimated: >60 mL/min (ref >60)
Glucose, Bld: 102 mg/dL — ABNORMAL HIGH (ref 70–99)
Potassium: 3.5 mmol/L (ref 3.5–5.1)
Sodium: 134 mmol/L — ABNORMAL LOW (ref 135–145)
Total Bilirubin: 1.3 mg/dL — ABNORMAL HIGH (ref 0.3–1.2)
Total Protein: 7.5 g/dL (ref 6.5–8.1)

## 2022-04-23 LAB — CBC
HCT: 39.2 % (ref 39.0–52.0)
Hemoglobin: 13.7 g/dL (ref 13.0–17.0)
MCH: 34.8 pg — ABNORMAL HIGH (ref 26.0–34.0)
MCHC: 34.9 g/dL (ref 30.0–36.0)
MCV: 99.5 fL (ref 80.0–100.0)
Platelets: 184 10*3/uL (ref 150–400)
RBC: 3.94 MIL/uL — ABNORMAL LOW (ref 4.22–5.81)
RDW: 15.1 % (ref 11.5–15.5)
WBC: 11.7 10*3/uL — ABNORMAL HIGH (ref 4.0–10.5)
nRBC: 0.9 % — ABNORMAL HIGH (ref 0.0–0.2)

## 2022-04-23 LAB — MAGNESIUM: Magnesium: 1.5 mg/dL — ABNORMAL LOW (ref 1.7–2.4)

## 2022-04-23 LAB — HEPARIN LEVEL (UNFRACTIONATED): Heparin Unfractionated: 0.4 IU/mL (ref 0.30–0.70)

## 2022-04-23 MED ORDER — MAGNESIUM SULFATE 4 GM/100ML IV SOLN
4.0000 g | Freq: Once | INTRAVENOUS | Status: AC
  Administered 2022-04-23: 4 g via INTRAVENOUS
  Filled 2022-04-23: qty 100

## 2022-04-23 MED ORDER — INSULIN GLARGINE-YFGN 100 UNIT/ML ~~LOC~~ SOLN
25.0000 [IU] | Freq: Every day | SUBCUTANEOUS | Status: DC
  Administered 2022-04-23: 25 [IU] via SUBCUTANEOUS
  Filled 2022-04-23 (×2): qty 0.25

## 2022-04-23 NOTE — Progress Notes (Signed)
Paitent refused cpap, stated he did not want it at [facility]ll

## 2022-04-23 NOTE — Inpatient Diabetes Management (Signed)
Inpatient Diabetes Program Recommendations  AACE/ADA: New Consensus Statement on Inpatient Glycemic Control (2015)  Target Ranges:  Prepandial:   less than 140 mg/dL      Peak postprandial:   less than 180 mg/dL (1-2 hours)      Critically ill patients:  140 - 180 mg/dL     Latest Reference Range & Units 04/22/22 06:02 04/22/22 06:22 04/22/22 06:42 04/22/22 11:33 04/22/22 16:33 04/22/22 21:11 04/23/22 06:30 04/23/22 07:03  Glucose-Capillary 70 - 99 mg/dL 69 (L) 47 (L) 92 132 (H) 130 (H) 124 (H) 61 (L) 88   Acute hypoxemic respiratory failure secondary to acute CHF exacerbation  Acute respiratory failure with hypoxia- PE   Home DM Meds: Humalog QID per SSI        Lantus 50 units Daily  Current Orders: Semglee 30 units Daily at [facility] 4 units TID with meals      Novolog Resistant Correction Scale/ SSI (0-20 units) TID AC + HS  ENDO: Dr. Buddy Duty with [facility] Last Seen February 2024   Last dose of Prednisone AM (03/10).  Hypoglycemic this am 61. Novolog meal coverage not given yesterday. Poor po intake  -   May consider reducing the Semglee again to 25 units QHS. -   Reduce Novolog Correction to 0-15 units tid  -   Reduce Novolog meal coverage to 2 units tid if eating <50% of meals   --Will follow patient during hospitalization--  Tama Headings RN, MSN, BC-ADM Inpatient Diabetes Coordinator Team Pager [phone] (8a-5p)

## 2022-04-23 NOTE — Progress Notes (Signed)
Rounding Note    Patient Name: [name] [name] Date of Encounter: 04/23/2022  [place] Cardiologist: Candee Furbish, MD   Subjective   Diuresed another 1L negative yesterday without lasix (2500 cc urine output). Now almost 5L negative. Breathing has improved.  Inpatient Medications    Scheduled Meds:  artificial tears   Right Eye Q4H   aspirin EC  81 mg Oral Daily   famotidine  20 mg Oral Daily   insulin aspart  0-20 Units Subcutaneous TID WC   insulin aspart  0-5 Units Subcutaneous QHS   insulin aspart  4 Units Subcutaneous TID WC   insulin glargine-yfgn  30 Units Subcutaneous Daily   metoprolol tartrate  12.5 mg Oral BID    morphine injection  2 mg Intravenous Once   pantoprazole  40 mg Oral Daily   polyethylene glycol  17 g Oral Daily   rosuvastatin  20 mg Oral QPM   sodium chloride flush  3 mL Intravenous Q12H   Continuous Infusions:  heparin 2,050 Units/hr (04/22/22 2251)   penicillin G potassium 12 Million Units in dextrose 5 % 500 mL CONTINUOUS infusion 12 Million Units (04/22/22 2323)   PRN Meds: acetaminophen **OR** acetaminophen, diclofenac Sodium, guaiFENesin, hydrALAZINE, [COMPLETED] ipratropium-albuterol **FOLLOWED BY** ipratropium-albuterol, metoprolol tartrate, ondansetron **OR** ondansetron (ZOFRAN) IV, pregabalin, senna-docusate, traZODone   Vital Signs    Vitals:   04/22/22 2044 04/23/22 0003 04/23/22 0353 04/23/22 0714  BP: 124/63 118/70 108/78 121/61  Pulse: 74 (!) 51 63 66  Resp: '16 19 13 11  '$ Temp: 98.3 F (36.8 C) 98 F (36.7 C) 98.7 F (37.1 C) (!) 97.5 F (36.4 C)  TempSrc: Oral Oral Oral Oral  SpO2: 95% 96% 93% 95%  Weight:  (!) 137.3 kg    Height:        Intake/Output Summary (Last 24 hours) at 04/23/2022 0859 Last data filed at 04/23/2022 0004 Gross per 24 hour  Intake 1020.79 ml  Output 2000 ml  Net -979.21 ml      04/23/2022   12:03 AM 04/22/2022    3:33 AM 04/21/2022   12:09 AM  Last 3 Weights  Weight (lbs) 302  lb 11.1 oz 302 lb 11.1 oz 307 lb 8.7 oz  Weight (kg) 137.3 kg 137.3 kg 139.5 kg      Telemetry    NSR rate of 80- Personally Reviewed  ECG    None new - Personally Reviewed  Physical Exam   GEN: No acute distress.   Neck: No JVD Cardiac: RRR, no murmurs, rubs, or gallops.  Respiratory: decreased breath sounds at the bases GI: firm distended abdomen MS: trace to 1+ edema Neuro:  Nonfocal  Psych: Normal affect   Labs    High Sensitivity Troponin:   Recent Labs  Lab 04/17/22 0045 04/17/22 0240  TROPONINIHS 11 11     Chemistry Recent Labs  Lab 04/20/22 0416 04/22/22 0058 04/23/22 0034  NA 130* 136 134*  K 3.9 3.5 3.5  CL 98 99 99  CO2 '22 28 27  '$ GLUCOSE 223* 51* 102*  BUN 37* 19 11  CREATININE 1.74* 1.51* 1.25*  CALCIUM 8.7* 8.7* 8.6*  MG 2.1 1.7 1.5*  PROT 7.4 7.5 7.5  ALBUMIN 2.4* 2.3* 2.2*  AST 63* 34 23  ALT 49* 33 25  ALKPHOS 104 141* 145*  BILITOT 1.9* 1.3* 1.3*  GFRNONAA 42* 50* >60  ANIONGAP '10 9 8    '$ Lipids No results for input(s): "CHOL", "TRIG", "HDL", "LABVLDL", "LDLCALC", "CHOLHDL"  in the last 168 hours.  Hematology Recent Labs  Lab 04/21/22 0046 04/22/22 0058 04/23/22 0034  WBC 13.4* 12.2* 11.7*  RBC 3.98* 4.13* 3.94*  HGB 13.8 13.8 13.7  HCT 39.2 41.6 39.2  MCV 98.5 100.7* 99.5  MCH 34.7* 33.4 34.8*  MCHC 35.2 33.2 34.9  RDW 14.6 14.8 15.1  PLT 165 188 184   Thyroid No results for input(s): "TSH", "FREET4" in the last 168 hours.  BNP Recent Labs  Lab 04/17/22 0830 04/18/22 0050  BNP 1,006.1* 938.7*    DDimer No results for input(s): "DDIMER" in the last 168 hours.   Radiology    VAS [place] UPPER EXTREMITY VENOUS DUPLEX  Result Date: 04/22/2022 UPPER VENOUS STUDY  Patient Name:  YAKOV LAUGHTON  Date of Exam:   04/22/2022 Medical Rec #: [id number]        Accession #:    [id number] Date of Birth: 1955/03/16        Patient Gender: M Patient Age:   67 years Exam Location:  [facility] Procedure:      VAS [place] UPPER EXTREMITY  VENOUS DUPLEX Referring Phys: TRUNG VU --------------------------------------------------------------------------------  Indications: Swelling Anticoagulation: Heparin. Comparison Study: No prior study. Performing Technologist: McKayla Maag RVT, VT  Examination Guidelines: A complete evaluation includes B-mode imaging, spectral Doppler, color Doppler, and power Doppler as needed of all accessible portions of each vessel. Bilateral testing is considered an integral part of a complete examination. Limited examinations for reoccurring indications may be performed as noted.  Right Findings: +----------+------------+---------+-----------+----------+-------+ RIGHT     CompressiblePhasicitySpontaneousPropertiesSummary +----------+------------+---------+-----------+----------+-------+ IJV           Full       Yes       Yes                      +----------+------------+---------+-----------+----------+-------+ Subclavian    Full       Yes       Yes                      +----------+------------+---------+-----------+----------+-------+ Axillary      Full       Yes       Yes                      +----------+------------+---------+-----------+----------+-------+ Brachial      Full       Yes       Yes                      +----------+------------+---------+-----------+----------+-------+ Radial        Full                                          +----------+------------+---------+-----------+----------+-------+ Ulnar         Full                                          +----------+------------+---------+-----------+----------+-------+ Cephalic      None       No        No                Acute  +----------+------------+---------+-----------+----------+-------+ Basilic       Full                                          +----------+------------+---------+-----------+----------+-------+  SVT in cephalic vein mid upper arm to wrist. SVT in branch off basilic vein at [facility]  Fossa.  Left Findings: +----------+------------+---------+-----------+----------+-------+ LEFT      CompressiblePhasicitySpontaneousPropertiesSummary +----------+------------+---------+-----------+----------+-------+ Subclavian    Full       Yes       Yes                      +----------+------------+---------+-----------+----------+-------+  Summary:  Right: Findings consistent with acute superficial vein thrombosis involving the right cephalic vein. As well as acute SVT in branch off basilic vein at [facility] fossa.  Left: No evidence of thrombosis in the subclavian.  *See table(s) above for measurements and observations.  Diagnosing physician: Servando Snare MD Electronically signed by Servando Snare MD on 04/22/2022 at 4:52:29 PM.    Final     Cardiac Studies   Echo 04/17/22: 1. Left ventricular ejection fraction, by estimation, is 55 to 60%. The  left ventricle has normal function. The left ventricle has no regional  wall motion abnormalities. Left ventricular diastolic parameters were  normal. There is the interventricular  septum is flattened in diastole ('D' shaped left ventricle), consistent  with right ventricular volume overload.   2. Right ventricular systolic function is severely reduced. The right  ventricular size is moderately enlarged. There is normal pulmonary artery  systolic pressure. The estimated right ventricular systolic pressure is  [id number] mmHg, but this may be severely  underestimated in the setting of torrential tricuspid regurgitation.   3. Left atrial size was mildly dilated.   4. Right atrial size was severely dilated.   5. The mitral valve is normal in structure. Mild mitral valve  regurgitation.   6. Tricuspid valve regurgitation is torrential, due to severe leaflet  malcoaptation.   7. The aortic valve is tricuspid. There is mild calcification of the  aortic valve. Aortic valve regurgitation is not visualized. Aortic valve  sclerosis/calcification is present,  without any evidence of aortic  stenosis.   Patient Profile     67 y.o. male  hx of CAD s/p CABG [id number], chronic diastolic heart failure, hypertension, liver cirrhosis, hypokalemia, OSA, RBBB, recent COVID infection history of ectopic atrial rhythm, CKD III, hyperlipidemia, morbid obesity, DM2 , PVD, who was admitted 04/16/2022 for the evaluation of acute hypoxia with right sided heart failure at the request of Dr. Florene Glen.  Now with Streptococcus pyogenes bacteremia with unclear source and new onset of severe tricuspid regurgitation.  Assessment & Plan    New onset right-sided heart failure preserved EF with CKD stage III Severe tricuspid regurgitation  Started on treatment for heart failure and received Lasix then became hypotensive and had decline in renal function in the emergency department. BNP 1000, chest x-ray with congestion but no edema. Since then diuretics and eplerinone have been withheld with some improvement of kidney function, but no updated labs this morning. Previously was being managed on 40 mg of torsemide daily, eplerenone 25 mg daily, and potassium supplementation.  He is intolerant to SGLT2 inhibitors due to prior yeast infection.  He has been followed by Dr. Marlou Porch and sees him regularly.  He is supposed to be on 2 L of oxygen at home but is noncompliant at home. Unable to maximize GDMT due to previous contraindications and declining renal function. Plan to schedule TEE to assess further when able to tolerate procedure.   -Auto-diuresing - would monitor output without lasix.  Creatinine continues to improve, now 1.25. - continue metoprolol 12.5 mg - TEE not  scheduled yet.  - on 2 [place] with home requirements of 2L, but non compliant at home.  - adding PT eval and compression stockings    Acute respiratory failure with hypoxia secondary to pulmonary embolism -Will eventually need to transition to eliquis. - currently on heparin infusion. No signs of major or minor bleeding.  -  neg for DVT 04/18/22 - 2L o2 [place]   Strep pyogenes bacteremia - TEE scheduled for Friday   CAD s/p CABG x 4 in 2005 Aortic atherosclerosis  HLD with LDL goal <70 - continue 81 mg aspirin and rosuvastatin '20mg'$  - denies any chest pain on this admission   HTN See above in the context of CHF management.   OSA - Previously deferred, but is now amenable to restarting CPAP.  Hypomagnesemia -1.5 today, ordered 4G magnesium repletion - recheck level tomorrow     For questions or updates, please contact [place] Please consult [email or web address] for contact info under   Pixie Casino, MD, FACC, [place] Director of the [facility]  Cardiovascular Risk Reduction [facility] Diplomate of the American Board of Clinical Lipidology Attending Cardiologist  Direct Dial: [phone]  Fax: [phone]  Website:  [email or web address]  Pixie Casino, MD  04/23/2022, 8:59 AM

## 2022-04-23 NOTE — Progress Notes (Signed)
Mobility Specialist - Progress Note   04/23/22 1000  Mobility  Activity Ambulated with assistance in room  Level of Assistance Minimal assist, patient does 75% or more  Assistive Device Front wheel walker  Distance Ambulated (ft) 15 ft  Activity Response Tolerated well  Mobility Referral Yes  $Mobility charge 1 Mobility    Pt agreeable to ambulate to sink for bath. MinA to stand from EOB. Left in chair w/ all needs met.   [place] Specialist Please contact via SecureChat or Rehab office at [phone]

## 2022-04-23 NOTE — TOC Benefit Eligibility Note (Signed)
Patient Advocate Encounter  Insurance verification completed.    The patient is currently admitted and upon discharge could be taking Eliquis 5 mg.  The current 30 day co-pay is $0.00.   The patient is insured through [employer] Medicare Part D   Niyanna Asch, CPHT Pharmacy Patient Advocate Specialist [facility] Patient Advocate Team Direct Number: [phone]  Fax: [phone]       

## 2022-04-23 NOTE — TOC Progression Note (Addendum)
Transition of Care [facility]) - Progression Note    Patient Details  Name: [name] [name] MRN: [id number] Date of Birth: 05-17-1955  Transition of Care [facility]) CM/SW Contact  Zenon Mayo, RN Phone Number: 04/23/2022, 2:56 PM  Clinical Narrative:    NCM spoke with patient at the bedside, offered choice for HHPT, he states he does not have  a preference.  NCM made referral to [facility] with [facility].  He is able to take referral.  Soc will begin 24 to 48 hrs post dc.  Patient states he also will need bariatric BSC and bariatric rollator , he has no preference of the agency.  NCM made referral to [facility] with [employer].  This DME will be delivered to patient's room prior to dc.  Patient states he has a cpap machine at home thru [facility] and he has oxygen concentrator at home thru [place] also but no tanks.  NCM contacted Franco Collet at [facility], she state that they picked up the oxygen on 04/23/20 due to patient being noncompliant and signing a noncompliant form, but he still has the concentrator.  If he need more oxygen will need ambulatory sat done and oxygen order.  Also patient states his cpap machine is working fine, but he has not used it since August 29, 2021 per Lenna Sciara with [facility].    He is set up with [facility], he has concentrator with [employer], will need ambulatory sats to be done to continue on home oxygen. Conts on iv abx, , hep drip, for TEE today, has Acute resp failure, PE, Bacteremia.  Will ask for benefit check for eliquis. Copay is zero dollars.   Expected Discharge Plan: [place] Barriers to Discharge: Continued Medical Work up  Expected Discharge Plan and Services In-house Referral: NA Discharge Planning Services: CM Consult Post Acute Care Choice: [place] arrangements for the past 2 months: Single Family Home                 DME Arranged: Walker rolling with seat, Bedside commode DME Agency: [employer] Date DME Agency Contacted:  04/23/22 Time DME Agency Contacted: [phone] Representative spoke with at DME Agency: [place]: PT [place]: [place] Date [place]: 04/23/22 Time [place]: [place] Representative spoke with at [place]: [place] ([place]) Interventions SDOH Screenings   Food Insecurity: No Food Insecurity (04/17/2022)  Housing: Low Risk  (04/17/2022)  Transportation Needs: No Transportation Needs (04/17/2022)  Utilities: Not At Risk (04/17/2022)  Depression (PHQ2-9): Low Risk  (04/15/2022)  Financial Resource Strain: Low Risk  (11/28/2021)  Physical Activity: Sufficiently Active (11/28/2021)  Social Connections: Socially Isolated (11/28/2021)  Stress: No Stress Concern Present (11/28/2021)  Tobacco Use: Medium Risk (04/15/2022)    Readmission Risk Interventions     No data to display

## 2022-04-23 NOTE — Progress Notes (Signed)
PROGRESS NOTE    FINLEY WHEATLEY  [id number] DOB: 08-Sep-1955 DOA: 04/16/2022 PCP: [name], MD    Brief Narrative:  67 year old male with coronary artery disease status post CABG in [id number], diastolic heart failure, cirrhosis of liver, ectopic atrial rhythm, hypertension, CKD 3A, diabetes mellitus type 2, sleep apnea, morbid obesity, peripheral neuropathy and varicose veins who presented to the hospital for back pain and found to be hypoxemic.  He was started on treatment for heart failure.  After receiving Lasix Cozaar and Lopressor, he became hypotensive in the ED. VQ scan on 3/8 showed pulmonary embolism, cultures ended up growing streptococcal pyogenes.  ID and cardiology was consulted.  Currently on IV penicillin, planning for TEE on Friday.  Currently trying to achieve fluid balance.   Assessment & Plan:   Acute respiratory failure with hypoxemia: Currently remains on 3 to 4 L of oxygen.  Not on oxygen at home.  Multifactorial. Right upper lobe pulmonary embolism, currently on heparin drip until procedures.  Lower extremity Dopplers negative for DVT.  Will treat with Eliquis after surgical procedures. Right-sided heart failure: Treated with aggressive diuresis and now on diuretics holiday.  On Lopressor.  Followed by cardiology. Continue to mobilize.  Wean off oxygen to keep saturation more than 90%.  Chest physiotherapy.  Streptococcal pyogenes bacteremia: Currently on penicillin.  Echocardiogram with severe TR.  ID and cardiology following.  CT scan showed multiple mandible lucencies.  TEE is scheduled for 3/15. Likely source of infection is left-sided mandibular teeth.  No drainable fluid collection.  Will need outpatient dental surgery follow up.  Uncontrolled type 2 diabetes, hypoglycemia: Patient is on long-acting insulin and prandial insulin.  Episode of hypoglycemia today morning.  Decrease long-acting insulin to 25 daily.  Acute kidney injury on chronic kidney disease  stage IIIa: Baseline creatinine about 1.7.  Peaked to 2.9.  Creatinine is trending down well to his baseline.  Now on diuretics holiday.  Hypomagnesemia: Replaced aggressively.  Recheck tomorrow morning.  Bell's palsy: Completed p.o. prednisone.  OSA: Declines CPAP.    DVT prophylaxis: Place TED hose Start: 04/22/22 0909   Code Status: Full code Family Communication: None at the bedside Disposition Plan: Status is: Inpatient Remains inpatient appropriate because: On active treatment, antibiotics, inpatient procedures planned     Consultants:  Cardiology ID  Procedures:  None  Antimicrobials:  Penicillin   Subjective: Patient seen in the morning rounds.  He was being held by nursing staff to the bedside commode.  Patient tells me he feels much better nowadays.  Using minimal oxygen.  Currently denies any complaints. He wants rollator and bedside commode on discharge.  He is very optimistic about going home after TEE.  Objective: Vitals:   04/22/22 2044 04/23/22 0003 04/23/22 0353 04/23/22 0714  BP: 124/63 118/70 108/78 121/61  Pulse: 74 (!) 51 63 66  Resp: '16 19 13 11  '$ Temp: 98.3 F (36.8 C) 98 F (36.7 C) 98.7 F (37.1 C) (!) 97.5 F (36.4 C)  TempSrc: Oral Oral Oral Oral  SpO2: 95% 96% 93% 95%  Weight:  (!) 137.3 kg    Height:        Intake/Output Summary (Last 24 hours) at 04/23/2022 1256 Last data filed at 04/23/2022 0004 Gross per 24 hour  Intake 537.65 ml  Output 1500 ml  Net -962.35 ml   Filed Weights   04/21/22 0009 04/22/22 0333 04/23/22 0003  Weight: (!) 139.5 kg (!) 137.3 kg (!) 137.3 kg    Examination:  General exam: Appears calm and comfortable  Chronically sick looking.  Not in any distress.  Obese gentleman on minimal oxygen. Respiratory system: Difficult to auscultate.  No added sounds. Cardiovascular system: S1 & S2 heard, RRR.  1+ bilateral pedal edema.  Obese extremities. Gastrointestinal system: Soft.  Nontender.  Pendulous.    Central nervous system: Alert and oriented. No focal neurological deficits.    Data Reviewed: I have personally reviewed following labs and imaging studies  CBC: Recent Labs  Lab 04/19/22 0125 04/20/22 0416 04/21/22 0046 04/22/22 0058 04/23/22 0034  WBC 14.3* 13.8* 13.4* 12.2* 11.7*  NEUTROABS  --  10.0*  --   --   --   HGB 12.9* 13.7 13.8 13.8 13.7  HCT 37.5* 39.1 39.2 41.6 39.2  MCV 100.3* 99.2 98.5 100.7* 99.5  PLT 148* 145* 165 188 [id number]   Basic Metabolic Panel: Recent Labs  Lab 04/18/22 0050 04/19/22 0124 04/20/22 0416 04/22/22 0058 04/23/22 0034  NA 133* 129* 130* 136 134*  K 4.5 4.3 3.9 3.5 3.5  CL 98 97* 98 99 99  CO2 20* 20* '22 28 27  '$ GLUCOSE 208* 262* 223* 51* 102*  BUN 34* 42* 37* 19 11  CREATININE 2.95* 2.36* 1.74* 1.51* 1.25*  CALCIUM 8.4* 8.3* 8.7* 8.7* 8.6*  MG  --   --  2.1 1.7 1.5*  PHOS  --   --  2.5  --   --    GFR: Estimated Creatinine Clearance: 80.1 mL/min (A) (by C-G formula based on SCr of 1.25 mg/dL (H)). Liver Function Tests: Recent Labs  Lab 04/17/22 0045 04/18/22 0050 04/20/22 0416 04/22/22 0058 04/23/22 0034  AST 30 75* 63* 34 23  ALT 19 28 49* 33 25  ALKPHOS 104 99 104 141* 145*  BILITOT 0.9 3.2* 1.9* 1.3* 1.3*  PROT 8.2* 7.9 7.4 7.5 7.5  ALBUMIN 3.1* 2.8* 2.4* 2.3* 2.2*   Recent Labs  Lab 04/17/22 0045  LIPASE 60*   No results for input(s): "AMMONIA" in the last 168 hours. Coagulation Profile: No results for input(s): "INR", "PROTIME" in the last 168 hours. Cardiac Enzymes: No results for input(s): "CKTOTAL", "CKMB", "CKMBINDEX", "TROPONINI" in the last 168 hours. BNP (last 3 results) No results for input(s): "PROBNP" in the last 8760 hours. HbA1C: No results for input(s): "HGBA1C" in the last 72 hours. CBG: Recent Labs  Lab 04/22/22 1633 04/22/22 2111 04/23/22 0630 04/23/22 0703 04/23/22 1157  GLUCAP 130* 124* 61* 88 78   Lipid Profile: No results for input(s): "CHOL", "HDL", "LDLCALC", "TRIG",  "CHOLHDL", "LDLDIRECT" in the last 72 hours. Thyroid Function Tests: No results for input(s): "TSH", "T4TOTAL", "FREET4", "T3FREE", "THYROIDAB" in the last 72 hours. Anemia Panel: No results for input(s): "VITAMINB12", "FOLATE", "FERRITIN", "TIBC", "IRON", "RETICCTPCT" in the last 72 hours. Sepsis Labs: Recent Labs  Lab 04/17/22 0240  PROCALCITON 0.69    Recent Results (from the past 240 hour(s))  Resp panel by RT-PCR (RSV, Flu A&B, Covid) Urine, Clean Catch     Status: None   Collection Time: 04/17/22  6:14 AM   Specimen: Urine, Clean Catch; Nasal Swab  Result Value Ref Range Status   SARS Coronavirus 2 by RT PCR NEGATIVE NEGATIVE Final   Influenza A by PCR NEGATIVE NEGATIVE Final   Influenza B by PCR NEGATIVE NEGATIVE Final    Comment: (NOTE) The Xpert Xpress SARS-CoV-2/FLU/RSV plus assay is intended as an aid in the diagnosis of influenza from Nasopharyngeal swab specimens and should not be used as a sole basis  for treatment. Nasal washings and aspirates are unacceptable for Xpert Xpress SARS-CoV-2/FLU/RSV testing.  Fact Sheet for Patients: EntrepreneurPulse.com.au  Fact Sheet for Healthcare Providers: IncredibleEmployment.be  This test is not yet approved or cleared by the [place] FDA and has been authorized for detection and/or diagnosis of SARS-CoV-2 by FDA under an Emergency Use Authorization (EUA). This EUA will remain in effect (meaning this test can be used) for the duration of the COVID-19 declaration under Section 564(b)(1) of the Act, 21 U.S.C. section 360bbb-3(b)(1), unless the authorization is terminated or revoked.     Resp Syncytial Virus by PCR NEGATIVE NEGATIVE Final    Comment: (NOTE) Fact Sheet for Patients: EntrepreneurPulse.com.au  Fact Sheet for Healthcare Providers: IncredibleEmployment.be  This test is not yet approved or cleared by the [place] FDA and has  been authorized for detection and/or diagnosis of SARS-CoV-2 by FDA under an Emergency Use Authorization (EUA). This EUA will remain in effect (meaning this test can be used) for the duration of the COVID-19 declaration under Section 564(b)(1) of the Act, 21 U.S.C. section 360bbb-3(b)(1), unless the authorization is terminated or revoked.  Performed at [facility], [place] [address], [place], [place] [address]   Respiratory (~20 pathogens) panel by PCR     Status: None   Collection Time: 04/17/22  7:19 AM   Specimen: Nasopharyngeal Swab; Respiratory  Result Value Ref Range Status   Adenovirus NOT DETECTED NOT DETECTED Final   Coronavirus 229E NOT DETECTED NOT DETECTED Final    Comment: (NOTE) The Coronavirus on the Respiratory Panel, DOES NOT test for the novel  Coronavirus (2019 nCoV)    Coronavirus HKU1 NOT DETECTED NOT DETECTED Final   Coronavirus NL63 NOT DETECTED NOT DETECTED Final   Coronavirus OC43 NOT DETECTED NOT DETECTED Final   Metapneumovirus NOT DETECTED NOT DETECTED Final   Rhinovirus / Enterovirus NOT DETECTED NOT DETECTED Final   Influenza A NOT DETECTED NOT DETECTED Final   Influenza B NOT DETECTED NOT DETECTED Final   Parainfluenza Virus 1 NOT DETECTED NOT DETECTED Final   Parainfluenza Virus 2 NOT DETECTED NOT DETECTED Final   Parainfluenza Virus 3 NOT DETECTED NOT DETECTED Final   Parainfluenza Virus 4 NOT DETECTED NOT DETECTED Final   Respiratory Syncytial Virus NOT DETECTED NOT DETECTED Final   Bordetella pertussis NOT DETECTED NOT DETECTED Final   Bordetella Parapertussis NOT DETECTED NOT DETECTED Final   Chlamydophila pneumoniae NOT DETECTED NOT DETECTED Final   Mycoplasma pneumoniae NOT DETECTED NOT DETECTED Final    Comment: Performed at [facility], [place]. [address], [place], [place] [address]  Culture, blood (Routine X 2) w Reflex to ID Panel     Status: Abnormal   Collection Time: 04/17/22  8:23 AM   Specimen: BLOOD  Result Value Ref Range  Status   Specimen Description BLOOD RIGHT ANTECUBITAL  Final   Special Requests   Final    BOTTLES DRAWN AEROBIC AND ANAEROBIC Blood Culture results may not be optimal due to an inadequate volume of blood received in culture bottles   Culture  Setup Time   Final    GRAM POSITIVE COCCI IN CHAINS IN BOTH AEROBIC AND ANAEROBIC BOTTLES CRITICAL RESULT CALLED TO, READ BACK BY AND VERIFIED WITH: PHARMD LISA CURRAN ON 04/17/22 @ 2151 BY DRT    Culture (A)  Final    STREPTOCOCCUS PYOGENES HEALTH DEPARTMENT NOTIFIED Performed at [facility], [place] [address] [place], [place] [address]    Report Status 04/19/2022 FINAL  Final   Organism ID, Bacteria STREPTOCOCCUS PYOGENES  Final      Susceptibility   Streptococcus pyogenes - MIC*    PENICILLIN <=0.06 SENSITIVE Sensitive     CEFTRIAXONE <=0.12 SENSITIVE Sensitive     ERYTHROMYCIN <=0.12 SENSITIVE Sensitive     LEVOFLOXACIN 0.5 SENSITIVE Sensitive     VANCOMYCIN 0.5 SENSITIVE Sensitive     * STREPTOCOCCUS PYOGENES  Blood Culture ID Panel (Reflexed)     Status: Abnormal   Collection Time: 04/17/22  8:23 AM  Result Value Ref Range Status   Enterococcus faecalis NOT DETECTED NOT DETECTED Final   Enterococcus Faecium NOT DETECTED NOT DETECTED Final   Listeria monocytogenes NOT DETECTED NOT DETECTED Final   Staphylococcus species NOT DETECTED NOT DETECTED Final   Staphylococcus aureus (BCID) NOT DETECTED NOT DETECTED Final   Staphylococcus epidermidis NOT DETECTED NOT DETECTED Final   Staphylococcus lugdunensis NOT DETECTED NOT DETECTED Final   Streptococcus species DETECTED (A) NOT DETECTED Final    Comment: CRITICAL RESULT CALLED TO, READ BACK BY AND VERIFIED WITH: PHARMD LISA CURRAN ON 04/17/22 @ 2151 BY DRT    Streptococcus agalactiae NOT DETECTED NOT DETECTED Final   Streptococcus pneumoniae NOT DETECTED NOT DETECTED Final   Streptococcus pyogenes DETECTED (A) NOT DETECTED Final    Comment: CRITICAL RESULT CALLED TO, READ BACK BY AND  VERIFIED WITH: PHARMD LISA CURRAN ON 04/17/22 @ 2151 BY DRT    A.calcoaceticus-baumannii NOT DETECTED NOT DETECTED Final   Bacteroides fragilis NOT DETECTED NOT DETECTED Final   Enterobacterales NOT DETECTED NOT DETECTED Final   Enterobacter cloacae complex NOT DETECTED NOT DETECTED Final   Escherichia coli NOT DETECTED NOT DETECTED Final   Klebsiella aerogenes NOT DETECTED NOT DETECTED Final   Klebsiella oxytoca NOT DETECTED NOT DETECTED Final   Klebsiella pneumoniae NOT DETECTED NOT DETECTED Final   Proteus species NOT DETECTED NOT DETECTED Final   Salmonella species NOT DETECTED NOT DETECTED Final   Serratia marcescens NOT DETECTED NOT DETECTED Final   Haemophilus influenzae NOT DETECTED NOT DETECTED Final   Neisseria meningitidis NOT DETECTED NOT DETECTED Final   Pseudomonas aeruginosa NOT DETECTED NOT DETECTED Final   Stenotrophomonas maltophilia NOT DETECTED NOT DETECTED Final   Candida albicans NOT DETECTED NOT DETECTED Final   Candida auris NOT DETECTED NOT DETECTED Final   Candida glabrata NOT DETECTED NOT DETECTED Final   Candida krusei NOT DETECTED NOT DETECTED Final   Candida parapsilosis NOT DETECTED NOT DETECTED Final   Candida tropicalis NOT DETECTED NOT DETECTED Final   Cryptococcus neoformans/gattii NOT DETECTED NOT DETECTED Final    Comment: Performed at [facility], [address], [place], [place] [address]  Culture, blood (Routine X 2) w Reflex to ID Panel     Status: Abnormal   Collection Time: 04/17/22  8:30 AM   Specimen: BLOOD LEFT FOREARM  Result Value Ref Range Status   Specimen Description BLOOD LEFT FOREARM  Final   Special Requests   Final    BOTTLES DRAWN AEROBIC AND ANAEROBIC Blood Culture adequate volume   Culture  Setup Time   Final    GRAM POSITIVE COCCI IN CHAINS IN BOTH AEROBIC AND ANAEROBIC BOTTLES CRITICAL VALUE NOTED.  VALUE IS CONSISTENT WITH PREVIOUSLY REPORTED AND CALLED VALUE.    Culture (A)  Final    STREPTOCOCCUS  PYOGENES SUSCEPTIBILITIES PERFORMED ON PREVIOUS CULTURE WITHIN THE LAST 5 DAYS. Performed at [facility], [place] [address] [place], [place] [address]    Report Status 04/19/2022  FINAL  Final  Culture, blood (Routine X 2) w Reflex to ID Panel     Status: None (Preliminary result)   Collection Time: 04/19/22  1:23 AM   Specimen: BLOOD LEFT HAND  Result Value Ref Range Status   Specimen Description BLOOD LEFT HAND  Final   Special Requests   Final    BOTTLES DRAWN AEROBIC ONLY Blood Culture results may not be optimal due to an inadequate volume of blood received in culture bottles   Culture   Final    NO GROWTH 4 DAYS Performed at [facility], [place] [address], [place], [place] [address]    Report Status PENDING  Incomplete  Culture, blood (Routine X 2) w Reflex to ID Panel     Status: None (Preliminary result)   Collection Time: 04/19/22  1:25 AM   Specimen: BLOOD RIGHT HAND  Result Value Ref Range Status   Specimen Description BLOOD RIGHT HAND  Final   Special Requests   Final    BOTTLES DRAWN AEROBIC AND ANAEROBIC Blood Culture adequate volume   Culture   Final    NO GROWTH 4 DAYS Performed at [facility], [place] [address], [place], [place] [address]    Report Status PENDING  Incomplete  Urine Culture (for pregnant, neutropenic or urologic patients or patients with an indwelling urinary catheter)     Status: None   Collection Time: 04/21/22  3:13 AM   Specimen: Urine, Clean Catch  Result Value Ref Range Status   Specimen Description URINE, CLEAN CATCH  Final   Special Requests NONE  Final   Culture   Final    NO GROWTH Performed at [facility], [place] [address] [place], [place] [address]    Report Status 04/22/2022 FINAL  Final         Radiology Studies: VAS [place] UPPER EXTREMITY VENOUS DUPLEX  Result Date: 04/22/2022 UPPER VENOUS STUDY  Patient Name:  ACHYUT CANNADY  Date of Exam:   04/22/2022 Medical Rec #: [id number]        Accession #:     [id number] Date of Birth: 06-Oct-1955        Patient Gender: M Patient Age:   42 years Exam Location:  [facility] Procedure:      VAS [place] UPPER EXTREMITY VENOUS DUPLEX Referring Phys: TRUNG VU --------------------------------------------------------------------------------  Indications: Swelling Anticoagulation: Heparin. Comparison Study: No prior study. Performing Technologist: McKayla Maag RVT, [place]  Examination Guidelines: A complete evaluation includes B-mode imaging, spectral Doppler, color Doppler, and power Doppler as needed of all accessible portions of each vessel. Bilateral testing is considered an integral part of a complete examination. Limited examinations for reoccurring indications may be performed as noted.  Right Findings: +----------+------------+---------+-----------+----------+-------+ RIGHT     CompressiblePhasicitySpontaneousPropertiesSummary +----------+------------+---------+-----------+----------+-------+ IJV           Full       Yes       Yes                      +----------+------------+---------+-----------+----------+-------+ Subclavian    Full       Yes       Yes                      +----------+------------+---------+-----------+----------+-------+ Axillary      Full       Yes       Yes                      +----------+------------+---------+-----------+----------+-------+  Brachial      Full       Yes       Yes                      +----------+------------+---------+-----------+----------+-------+ Radial        Full                                          +----------+------------+---------+-----------+----------+-------+ Ulnar         Full                                          +----------+------------+---------+-----------+----------+-------+ Cephalic      None       No        No                Acute  +----------+------------+---------+-----------+----------+-------+ Basilic       Full                                           +----------+------------+---------+-----------+----------+-------+ SVT in cephalic vein mid upper arm to wrist. SVT in branch off basilic vein at [facility].  Left Findings: +----------+------------+---------+-----------+----------+-------+ LEFT      CompressiblePhasicitySpontaneousPropertiesSummary +----------+------------+---------+-----------+----------+-------+ Subclavian    Full       Yes       Yes                      +----------+------------+---------+-----------+----------+-------+  Summary:  Right: Findings consistent with acute superficial vein thrombosis involving the right cephalic vein. As well as acute SVT in branch off basilic vein at [facility] fossa.  Left: No evidence of thrombosis in the subclavian.  *See table(s) above for measurements and observations.  Diagnosing physician: Servando Snare MD Electronically signed by Servando Snare MD on 04/22/2022 at 4:52:29 PM.    Final         Scheduled Meds:  artificial tears   Right Eye Q4H   aspirin EC  81 mg Oral Daily   famotidine  20 mg Oral Daily   insulin aspart  0-20 Units Subcutaneous TID WC   insulin aspart  0-5 Units Subcutaneous QHS   insulin aspart  4 Units Subcutaneous TID WC   insulin glargine-yfgn  25 Units Subcutaneous Daily   metoprolol tartrate  12.5 mg Oral BID    morphine injection  2 mg Intravenous Once   pantoprazole  40 mg Oral Daily   polyethylene glycol  17 g Oral Daily   rosuvastatin  20 mg Oral QPM   sodium chloride flush  3 mL Intravenous Q12H   Continuous Infusions:  heparin 2,050 Units/hr (04/22/22 2251)   penicillin G potassium 12 Million Units in dextrose 5 % 500 mL CONTINUOUS infusion 12 Million Units (04/23/22 1056)     LOS: 5 days    Time spent: 35 minutes    Barb Merino, MD [facility] Pager [phone]

## 2022-04-23 NOTE — Progress Notes (Signed)
ANTICOAGULATION CONSULT NOTE - Follow Up Consult  Pharmacy Consult for Heparin Indication: pulmonary embolus  Allergies  Allergen Reactions   Empagliflozin Rash    Patient reports yeast.    Ibuprofen Other (See Comments)    Stomach pain    Patient Measurements: Height: '5\' 11"'$  (180.3 cm) Weight: (!) 137.3 kg (302 lb 11.1 oz) IBW/kg (Calculated) : 75.3 Heparin Dosing Weight: 107 kg  Vital Signs: Temp: 97.5 F (36.4 C) (03/13 0714) Temp Source: Oral (03/13 0714) BP: 121/61 (03/13 0714) Pulse Rate: 66 (03/13 0714)  Labs: Recent Labs    04/21/22 0046 04/21/22 1054 04/21/22 2052 04/22/22 0058 04/23/22 0034  HGB 13.8  --   --  13.8 13.7  HCT 39.2  --   --  41.6 39.2  PLT 165  --   --  188 184  HEPARINUNFRC 0.80*   < > 0.46 0.39 0.40  CREATININE  --   --   --  1.51* 1.25*   < > = values in this interval not displayed.     Estimated Creatinine Clearance: 80.1 mL/min (A) (by C-G formula based on SCr of 1.25 mg/dL (H)).  Assessment: 67 y.o. male with Group A Strep bacteremia/PNA. V/Q suggestive of PE on 3/8. Pharmacy consulted for heparin dosing.  LE duplex negative for DVT on 3/8.  R arm swelling on 3/12 > duplex shows acute superficial vein thrombosis involving R cephalic vein.  Heparin level remains therapeutic (0.40) on 2050 units/hr. Hgb stable, platelet count was in 140s > back into normal range.     Goal of Therapy:  Heparin level 0.3-0.7 units/ml Monitor platelets by anticoagulation protocol: Yes   Plan:  Continue heparin drip at 2050 units/hr Daily heparin level and CBC. Noted plan for TEE on 3/15.  Arty Baumgartner, [place] 04/23/2022,8:23 AM

## 2022-04-24 ENCOUNTER — Other Ambulatory Visit: Payer: Self-pay | Admitting: Physician Assistant

## 2022-04-24 DIAGNOSIS — J9601 Acute respiratory failure with hypoxia: Secondary | ICD-10-CM | POA: Diagnosis not present

## 2022-04-24 LAB — CBC
HCT: 46.3 % (ref 39.0–52.0)
Hemoglobin: 15.6 g/dL (ref 13.0–17.0)
MCH: 34.1 pg — ABNORMAL HIGH (ref 26.0–34.0)
MCHC: 33.7 g/dL (ref 30.0–36.0)
MCV: 101.1 fL — ABNORMAL HIGH (ref 80.0–100.0)
Platelets: 175 10*3/uL (ref 150–400)
RBC: 4.58 MIL/uL (ref 4.22–5.81)
RDW: 15.6 % — ABNORMAL HIGH (ref 11.5–15.5)
WBC: 7.5 10*3/uL (ref 4.0–10.5)
nRBC: 0.7 % — ABNORMAL HIGH (ref 0.0–0.2)

## 2022-04-24 LAB — HEPARIN LEVEL (UNFRACTIONATED): Heparin Unfractionated: 0.33 IU/mL (ref 0.30–0.70)

## 2022-04-24 LAB — CULTURE, BLOOD (ROUTINE X 2)
Culture: NO GROWTH
Culture: NO GROWTH
Special Requests: ADEQUATE

## 2022-04-24 LAB — GLUCOSE, CAPILLARY
Glucose-Capillary: 53 mg/dL — ABNORMAL LOW (ref 70–99)
Glucose-Capillary: 88 mg/dL (ref 70–99)
Glucose-Capillary: 156 mg/dL — ABNORMAL HIGH (ref 70–99)
Glucose-Capillary: 125 mg/dL — ABNORMAL HIGH (ref 70–99)
Glucose-Capillary: 140 mg/dL — ABNORMAL HIGH (ref 70–99)

## 2022-04-24 LAB — COMPREHENSIVE METABOLIC PANEL
ALT: 21 U/L (ref 0–44)
AST: 22 U/L (ref 15–41)
Albumin: 2.2 g/dL — ABNORMAL LOW (ref 3.5–5.0)
Alkaline Phosphatase: 145 U/L — ABNORMAL HIGH (ref 38–126)
Anion gap: 10 (ref 5–15)
BUN: 9 mg/dL (ref 8–23)
CO2: 26 mmol/L (ref 22–32)
Calcium: 8.5 mg/dL — ABNORMAL LOW (ref 8.9–10.3)
Chloride: 98 mmol/L (ref 98–111)
Creatinine, Ser: 1.17 mg/dL (ref 0.61–1.24)
GFR, Estimated: >60 mL/min (ref >60)
Glucose, Bld: 102 mg/dL — ABNORMAL HIGH (ref 70–99)
Potassium: 3.7 mmol/L (ref 3.5–5.1)
Sodium: 134 mmol/L — ABNORMAL LOW (ref 135–145)
Total Bilirubin: 1.8 mg/dL — ABNORMAL HIGH (ref 0.3–1.2)
Total Protein: 8 g/dL (ref 6.5–8.1)

## 2022-04-24 LAB — MAGNESIUM: Magnesium: 1.8 mg/dL (ref 1.7–2.4)

## 2022-04-24 MED ORDER — MAGNESIUM SULFATE 2 GM/50ML IV SOLN
2.0000 g | Freq: Once | INTRAVENOUS | Status: AC
  Administered 2022-04-24: 2 g via INTRAVENOUS
  Filled 2022-04-24: qty 50

## 2022-04-24 MED ORDER — INSULIN GLARGINE-YFGN 100 UNIT/ML ~~LOC~~ SOLN
20.0000 [IU] | Freq: Every day | SUBCUTANEOUS | Status: DC
  Administered 2022-04-24 – 2022-04-25 (×2): 20 [IU] via SUBCUTANEOUS
  Filled 2022-04-24 (×3): qty 0.2

## 2022-04-24 MED ORDER — DEXTROSE 50 % IV SOLN
INTRAVENOUS | Status: AC
  Filled 2022-04-24: qty 50

## 2022-04-24 NOTE — Progress Notes (Signed)
ANTICOAGULATION CONSULT NOTE - Follow Up Consult  Pharmacy Consult for Heparin Indication: pulmonary embolus  Allergies  Allergen Reactions   Empagliflozin Rash    Patient reports yeast.    Ibuprofen Other (See Comments)    Stomach pain    Patient Measurements: Height: '5\' 11"'$  (180.3 cm) Weight: (!) 137.3 kg (302 lb 11.1 oz) IBW/kg (Calculated) : 75.3 Heparin Dosing Weight: 107 kg  Vital Signs: Temp: 98.1 F (36.7 C) (03/14 0407) Temp Source: Oral (03/14 0407) BP: 131/77 (03/14 0407) Pulse Rate: 62 (03/14 0407)  Labs: Recent Labs    04/22/22 0058 04/23/22 0034 04/24/22 0048  HGB 13.8 13.7 15.6  HCT 41.6 39.2 46.3  PLT 188 184 175  HEPARINUNFRC 0.39 0.40 0.33  CREATININE 1.51* 1.25* 1.17     Estimated Creatinine Clearance: 85.6 mL/min (by C-G formula based on SCr of 1.17 mg/dL).  Assessment: 68 y.o. male with Group A Strep bacteremia/PNA. V/Q suggestive of PE on 3/8. Pharmacy consulted for heparin dosing.  LE duplex negative for DVT on 3/8.  R arm swelling on 3/12 > duplex shows acute superficial vein thrombosis involving R cephalic vein.  Heparin level remains therapeutic (0.40) on 2050 units/hr. Hgb stable, platelet count was in 140s > back into normal range.     Goal of Therapy:  Heparin level 0.3-0.7 units/ml Monitor platelets by anticoagulation protocol: Yes   Plan:  Continue heparin drip at 2050 units/hr Daily heparin level and CBC. Noted plan for TEE on 3/15.   [place] Crane, Roylene Reason, BCCP Clinical Pharmacist  04/24/2022 8:27 AM   [facility] pharmacy phone numbers are listed on [email or web address]

## 2022-04-24 NOTE — H&P (View-Only) (Signed)
 Rounding Note    Patient Name: [name] [name] Date of Encounter: 04/24/2022  [facility] Cardiologist: Mark Skains, MD   Subjective   Net negative another 1.2L (with 3351 out) - overall 5L Negative. Auto-diuresing. Creatinine further improved, now down to 1.17. Refursed CPAP overnight. Plan for TEE tomorrow.  Inpatient Medications    Scheduled Meds:  artificial tears   Right Eye Q4H   aspirin EC  81 mg Oral Daily   dextrose       famotidine  20 mg Oral Daily   insulin aspart  0-20 Units Subcutaneous TID WC   insulin aspart  0-5 Units Subcutaneous QHS   insulin aspart  4 Units Subcutaneous TID WC   insulin glargine-yfgn  20 Units Subcutaneous Daily   metoprolol tartrate  12.5 mg Oral BID   pantoprazole  40 mg Oral Daily   polyethylene glycol  17 g Oral Daily   rosuvastatin  20 mg Oral QPM   sodium chloride flush  3 mL Intravenous Q12H   Continuous Infusions:  heparin 2,050 Units/hr (04/23/22 1515)   penicillin G potassium 12 Million Units in dextrose 5 % 500 mL CONTINUOUS infusion 12 Million Units (04/24/22 0939)   PRN Meds: acetaminophen **OR** acetaminophen, dextrose, diclofenac Sodium, guaiFENesin, hydrALAZINE, [COMPLETED] ipratropium-albuterol **FOLLOWED BY** ipratropium-albuterol, metoprolol tartrate, ondansetron **OR** ondansetron (ZOFRAN) IV, pregabalin, senna-docusate, traZODone   Vital Signs    Vitals:   04/23/22 1621 04/23/22 2049 04/24/22 0020 04/24/22 0407  BP: 120/80 127/76 110/68 131/77  Pulse: 82 81 77 62  Resp: 20 14 12 14  Temp: (!) 97.4 F (36.3 C) (!) 97.5 F (36.4 C) 98.4 F (36.9 C) 98.1 F (36.7 C)  TempSrc: Oral Oral Oral Oral  SpO2: 97% 94% 96% 97%  Weight:      Height:        Intake/Output Summary (Last 24 hours) at 04/24/2022 1004 Last data filed at 04/24/2022 0409 Gross per 24 hour  Intake 1872.52 ml  Output 3351 ml  Net -1478.48 ml      04/23/2022   12:03 AM 04/22/2022    3:33 AM 04/21/2022   12:09 AM  Last 3  Weights  Weight (lbs) 302 lb 11.1 oz 302 lb 11.1 oz 307 lb 8.7 oz  Weight (kg) 137.3 kg 137.3 kg 139.5 kg      Telemetry    NSR rate of 80- Personally Reviewed  ECG    None new - Personally Reviewed  Physical Exam   GEN: No acute distress.   Neck: No JVD Cardiac: RRR, no murmurs, rubs, or gallops.  Respiratory: decreased breath sounds at the bases GI: firm distended abdomen MS: trace edema Neuro:  Nonfocal  Psych: Normal affect   Labs    High Sensitivity Troponin:   Recent Labs  Lab 04/17/22 0045 04/17/22 0240  TROPONINIHS 11 11     Chemistry Recent Labs  Lab 04/22/22 0058 04/23/22 0034 04/24/22 0048  NA 136 134* 134*  K 3.5 3.5 3.7  CL 99 99 98  CO2 28 27 26  GLUCOSE 51* 102* 102*  BUN 19 11 9  CREATININE 1.51* 1.25* 1.17  CALCIUM 8.7* 8.6* 8.5*  MG 1.7 1.5* 1.8  PROT 7.5 7.5 8.0  ALBUMIN 2.3* 2.2* 2.2*  AST 34 23 22  ALT 33 25 21  ALKPHOS 141* 145* 145*  BILITOT 1.3* 1.3* 1.8*  GFRNONAA 50* >60 >60  ANIONGAP 9 8 10    Lipids No results for input(s): "CHOL", "TRIG", "HDL", "LABVLDL", "  LDLCALC", "CHOLHDL" in the last 168 hours.  Hematology Recent Labs  Lab 04/22/22 0058 04/23/22 0034 04/24/22 0048  WBC 12.2* 11.7* 7.5  RBC 4.13* 3.94* 4.58  HGB 13.8 13.7 15.6  HCT 41.6 39.2 46.3  MCV 100.7* 99.5 101.1*  MCH 33.4 34.8* 34.1*  MCHC 33.2 34.9 33.7  RDW 14.8 15.1 15.6*  PLT 188 184 175   Thyroid No results for input(s): "TSH", "FREET4" in the last 168 hours.  BNP Recent Labs  Lab 04/18/22 0050  BNP 938.7*    DDimer No results for input(s): "DDIMER" in the last 168 hours.   Radiology    VAS US UPPER EXTREMITY VENOUS DUPLEX  Result Date: 04/22/2022 UPPER VENOUS STUDY  Patient Name:  [name] [name]  Date of Exam:   04/22/2022 Medical Rec #: [id number]        Accession #:    [id number] Date of Birth: 08/21/1955        Patient Gender: M Patient Age:   66 years Exam Location:  [facility] Procedure:      VAS US UPPER EXTREMITY VENOUS  DUPLEX Referring Phys: TRUNG VU --------------------------------------------------------------------------------  Indications: Swelling Anticoagulation: Heparin. Comparison Study: No prior study. Performing Technologist: McKayla Maag RVT, [place]  Examination Guidelines: A complete evaluation includes B-mode imaging, spectral Doppler, color Doppler, and power Doppler as needed of all accessible portions of each vessel. Bilateral testing is considered an integral part of a complete examination. Limited examinations for reoccurring indications may be performed as noted.  Right Findings: +----------+------------+---------+-----------+----------+-------+ RIGHT     CompressiblePhasicitySpontaneousPropertiesSummary +----------+------------+---------+-----------+----------+-------+ IJV           Full       Yes       Yes                      +----------+------------+---------+-----------+----------+-------+ Subclavian    Full       Yes       Yes                      +----------+------------+---------+-----------+----------+-------+ Axillary      Full       Yes       Yes                      +----------+------------+---------+-----------+----------+-------+ Brachial      Full       Yes       Yes                      +----------+------------+---------+-----------+----------+-------+ Radial        Full                                          +----------+------------+---------+-----------+----------+-------+ Ulnar         Full                                          +----------+------------+---------+-----------+----------+-------+ Cephalic      None       No        No                Acute  +----------+------------+---------+-----------+----------+-------+ Basilic       Full                                          +----------+------------+---------+-----------+----------+-------+   SVT in cephalic vein mid upper arm to wrist. SVT in branch off basilic vein at AC Fossa.   Left Findings: +----------+------------+---------+-----------+----------+-------+ LEFT      CompressiblePhasicitySpontaneousPropertiesSummary +----------+------------+---------+-----------+----------+-------+ Subclavian    Full       Yes       Yes                      +----------+------------+---------+-----------+----------+-------+  Summary:  Right: Findings consistent with acute superficial vein thrombosis involving the right cephalic vein. As well as acute SVT in branch off basilic vein at AC fossa.  Left: No evidence of thrombosis in the subclavian.  *See table(s) above for measurements and observations.  Diagnosing physician: Brandon Cain MD Electronically signed by Brandon Cain MD on 04/22/2022 at 4:52:29 PM.    Final     Cardiac Studies   Echo 04/17/22: 1. Left ventricular ejection fraction, by estimation, is 55 to 60%. The  left ventricle has normal function. The left ventricle has no regional  wall motion abnormalities. Left ventricular diastolic parameters were  normal. There is the interventricular  septum is flattened in diastole ('D' shaped left ventricle), consistent  with right ventricular volume overload.   2. Right ventricular systolic function is severely reduced. The right  ventricular size is moderately enlarged. There is normal pulmonary artery  systolic pressure. The estimated right ventricular systolic pressure is  26.2 mmHg, but this may be severely  underestimated in the setting of torrential tricuspid regurgitation.   3. Left atrial size was mildly dilated.   4. Right atrial size was severely dilated.   5. The mitral valve is normal in structure. Mild mitral valve  regurgitation.   6. Tricuspid valve regurgitation is torrential, due to severe leaflet  malcoaptation.   7. The aortic valve is tricuspid. There is mild calcification of the  aortic valve. Aortic valve regurgitation is not visualized. Aortic valve  sclerosis/calcification is present, without  any evidence of aortic  stenosis.   Patient Profile     66 y.o. male  hx of CAD s/p CABG 2005, chronic diastolic heart failure, hypertension, liver cirrhosis, hypokalemia, OSA, RBBB, recent COVID infection history of ectopic atrial rhythm, CKD III, hyperlipidemia, morbid obesity, DM2 , PVD, who was admitted 04/16/2022 for the evaluation of acute hypoxia with right sided heart failure at the request of Dr. Powell.  Now with Streptococcus pyogenes bacteremia with unclear source and new onset of severe tricuspid regurgitation.  Assessment & Plan    New onset right-sided heart failure preserved EF with CKD stage III Severe tricuspid regurgitation  Started on treatment for heart failure and received Lasix then became hypotensive and had decline in renal function in the emergency department. BNP 1000, chest x-ray with congestion but no edema. Since then diuretics and eplerinone have been withheld with some improvement of kidney function, but no updated labs this morning. Previously was being managed on 40 mg of torsemide daily, eplerenone 25 mg daily, and potassium supplementation.  He is intolerant to SGLT2 inhibitors due to prior yeast infection.  He has been followed by Dr. Skains and sees him regularly.  He is supposed to be on 2 L of oxygen at home but is noncompliant at home. Unable to maximize GDMT due to previous contraindications and declining renal function.   -Auto-diuresing - would monitor output without lasix.  Creatinine continues to improve, now 1.17 - continue metoprolol 12.5 mg BID - TEE Friday - refused CPAP QHS   Acute respiratory failure with hypoxia   secondary to pulmonary embolism -Will eventually need to transition to eliquis. - currently on heparin infusion. No signs of major or minor bleeding.  - neg for DVT 04/18/22 - 2L o2 [place]   Strep pyogenes bacteremia - TEE scheduled for Friday   CAD s/p CABG x 4 in 2005 Aortic atherosclerosis  HLD with LDL goal <70 - continue 81 mg  aspirin and rosuvastatin 20mg - denies any chest pain on this admission   HTN See above in the context of CHF management.   OSA - Now has refused CPAP again  Hypomagnesemia -1.8 today- will give another 2G today     For questions or updates, please contact [facility] Please consult [email or web address] for contact info under   Xavior Niazi C. Amada Hallisey, MD, FACC, FACP  [place]  [facility]  Medical Director of the Advanced Lipid Disorders &  Cardiovascular Risk Reduction Clinic Diplomate of the American Board of Clinical Lipidology Attending Cardiologist  Direct Dial: [phone]  Fax: [phone]  Website:  [email or web address]  Jacky Hartung C Aloura Matsuoka, MD  04/24/2022, 10:04 AM    

## 2022-04-24 NOTE — Progress Notes (Signed)
Rounding Note    Patient Name: [name] [name] Date of Encounter: 04/24/2022  [facility] Cardiologist: Candee Furbish, MD   Subjective   Net negative another 1.2L (with 3351 out) - overall 5L Negative. Auto-diuresing. Creatinine further improved, now down to 1.17. Refursed CPAP overnight. Plan for TEE tomorrow.  Inpatient Medications    Scheduled Meds:  artificial tears   Right Eye Q4H   aspirin EC  81 mg Oral Daily   dextrose       famotidine  20 mg Oral Daily   insulin aspart  0-20 Units Subcutaneous TID WC   insulin aspart  0-5 Units Subcutaneous QHS   insulin aspart  4 Units Subcutaneous TID WC   insulin glargine-yfgn  20 Units Subcutaneous Daily   metoprolol tartrate  12.5 mg Oral BID   pantoprazole  40 mg Oral Daily   polyethylene glycol  17 g Oral Daily   rosuvastatin  20 mg Oral QPM   sodium chloride flush  3 mL Intravenous Q12H   Continuous Infusions:  heparin 2,050 Units/hr (04/23/22 1515)   penicillin G potassium 12 Million Units in dextrose 5 % 500 mL CONTINUOUS infusion 12 Million Units (04/24/22 0939)   PRN Meds: acetaminophen **OR** acetaminophen, dextrose, diclofenac Sodium, guaiFENesin, hydrALAZINE, [COMPLETED] ipratropium-albuterol **FOLLOWED BY** ipratropium-albuterol, metoprolol tartrate, ondansetron **OR** ondansetron (ZOFRAN) IV, pregabalin, senna-docusate, traZODone   Vital Signs    Vitals:   04/23/22 1621 04/23/22 2049 04/24/22 0020 04/24/22 0407  BP: 120/80 127/76 110/68 131/77  Pulse: 82 81 77 62  Resp: '20 14 12 14  '$ Temp: (!) 97.4 F (36.3 C) (!) 97.5 F (36.4 C) 98.4 F (36.9 C) 98.1 F (36.7 C)  TempSrc: Oral Oral Oral Oral  SpO2: 97% 94% 96% 97%  Weight:      Height:        Intake/Output Summary (Last 24 hours) at 04/24/2022 1004 Last data filed at 04/24/2022 0409 Gross per 24 hour  Intake 1872.52 ml  Output 3351 ml  Net -1478.48 ml      04/23/2022   12:03 AM 04/22/2022    3:33 AM 04/21/2022   12:09 AM  Last 3  Weights  Weight (lbs) 302 lb 11.1 oz 302 lb 11.1 oz 307 lb 8.7 oz  Weight (kg) 137.3 kg 137.3 kg 139.5 kg      Telemetry    NSR rate of 80- Personally Reviewed  ECG    None new - Personally Reviewed  Physical Exam   GEN: No acute distress.   Neck: No JVD Cardiac: RRR, no murmurs, rubs, or gallops.  Respiratory: decreased breath sounds at the bases GI: firm distended abdomen MS: trace edema Neuro:  Nonfocal  Psych: Normal affect   Labs    High Sensitivity Troponin:   Recent Labs  Lab 04/17/22 0045 04/17/22 0240  TROPONINIHS 11 11     Chemistry Recent Labs  Lab 04/22/22 0058 04/23/22 0034 04/24/22 0048  NA 136 134* 134*  K 3.5 3.5 3.7  CL 99 99 98  CO2 '28 27 26  '$ GLUCOSE 51* 102* 102*  BUN '19 11 9  '$ CREATININE 1.51* 1.25* 1.17  CALCIUM 8.7* 8.6* 8.5*  MG 1.7 1.5* 1.8  PROT 7.5 7.5 8.0  ALBUMIN 2.3* 2.2* 2.2*  AST 34 23 22  ALT 33 25 21  ALKPHOS 141* 145* 145*  BILITOT 1.3* 1.3* 1.8*  GFRNONAA 50* >60 >60  ANIONGAP '9 8 10    '$ Lipids No results for input(s): "CHOL", "TRIG", "HDL", "LABVLDL", "  [place]", "CHOLHDL" in the last 168 hours.  Hematology Recent Labs  Lab 04/22/22 0058 04/23/22 0034 04/24/22 0048  WBC 12.2* 11.7* 7.5  RBC 4.13* 3.94* 4.58  HGB 13.8 13.7 15.6  HCT 41.6 39.2 46.3  MCV 100.7* 99.5 101.1*  MCH 33.4 34.8* 34.1*  MCHC 33.2 34.9 33.7  RDW 14.8 15.1 15.6*  PLT 188 184 175   Thyroid No results for input(s): "TSH", "FREET4" in the last 168 hours.  BNP Recent Labs  Lab 04/18/22 0050  BNP 938.7*    DDimer No results for input(s): "DDIMER" in the last 168 hours.   Radiology    VAS [place] UPPER EXTREMITY VENOUS DUPLEX  Result Date: 04/22/2022 UPPER VENOUS STUDY  Patient Name:  EDITH KELDERMAN  Date of Exam:   04/22/2022 Medical Rec #: [id number]        Accession #:    [id number] Date of Birth: July 30, 1955        Patient Gender: M Patient Age:   67 years Exam Location:  [facility] Procedure:      VAS [place] UPPER EXTREMITY VENOUS  DUPLEX Referring Phys: TRUNG VU --------------------------------------------------------------------------------  Indications: Swelling Anticoagulation: Heparin. Comparison Study: No prior study. Performing Technologist: McKayla Maag RVT, [place]  Examination Guidelines: A complete evaluation includes B-mode imaging, spectral Doppler, color Doppler, and power Doppler as needed of all accessible portions of each vessel. Bilateral testing is considered an integral part of a complete examination. Limited examinations for reoccurring indications may be performed as noted.  Right Findings: +----------+------------+---------+-----------+----------+-------+ RIGHT     CompressiblePhasicitySpontaneousPropertiesSummary +----------+------------+---------+-----------+----------+-------+ IJV           Full       Yes       Yes                      +----------+------------+---------+-----------+----------+-------+ Subclavian    Full       Yes       Yes                      +----------+------------+---------+-----------+----------+-------+ Axillary      Full       Yes       Yes                      +----------+------------+---------+-----------+----------+-------+ Brachial      Full       Yes       Yes                      +----------+------------+---------+-----------+----------+-------+ Radial        Full                                          +----------+------------+---------+-----------+----------+-------+ Ulnar         Full                                          +----------+------------+---------+-----------+----------+-------+ Cephalic      None       No        No                Acute  +----------+------------+---------+-----------+----------+-------+ Basilic       Full                                          +----------+------------+---------+-----------+----------+-------+  SVT in cephalic vein mid upper arm to wrist. SVT in branch off basilic vein at [facility] Fossa.   Left Findings: +----------+------------+---------+-----------+----------+-------+ LEFT      CompressiblePhasicitySpontaneousPropertiesSummary +----------+------------+---------+-----------+----------+-------+ Subclavian    Full       Yes       Yes                      +----------+------------+---------+-----------+----------+-------+  Summary:  Right: Findings consistent with acute superficial vein thrombosis involving the right cephalic vein. As well as acute SVT in branch off basilic vein at [facility] fossa.  Left: No evidence of thrombosis in the subclavian.  *See table(s) above for measurements and observations.  Diagnosing physician: Servando Snare MD Electronically signed by Servando Snare MD on 04/22/2022 at 4:52:29 PM.    Final     Cardiac Studies   Echo 04/17/22: 1. Left ventricular ejection fraction, by estimation, is 55 to 60%. The  left ventricle has normal function. The left ventricle has no regional  wall motion abnormalities. Left ventricular diastolic parameters were  normal. There is the interventricular  septum is flattened in diastole ('D' shaped left ventricle), consistent  with right ventricular volume overload.   2. Right ventricular systolic function is severely reduced. The right  ventricular size is moderately enlarged. There is normal pulmonary artery  systolic pressure. The estimated right ventricular systolic pressure is  [id number] mmHg, but this may be severely  underestimated in the setting of torrential tricuspid regurgitation.   3. Left atrial size was mildly dilated.   4. Right atrial size was severely dilated.   5. The mitral valve is normal in structure. Mild mitral valve  regurgitation.   6. Tricuspid valve regurgitation is torrential, due to severe leaflet  malcoaptation.   7. The aortic valve is tricuspid. There is mild calcification of the  aortic valve. Aortic valve regurgitation is not visualized. Aortic valve  sclerosis/calcification is present, without  any evidence of aortic  stenosis.   Patient Profile     67 y.o. male  hx of CAD s/p CABG [id number], chronic diastolic heart failure, hypertension, liver cirrhosis, hypokalemia, OSA, RBBB, recent COVID infection history of ectopic atrial rhythm, CKD III, hyperlipidemia, morbid obesity, DM2 , PVD, who was admitted 04/16/2022 for the evaluation of acute hypoxia with right sided heart failure at the request of Dr. Florene Glen.  Now with Streptococcus pyogenes bacteremia with unclear source and new onset of severe tricuspid regurgitation.  Assessment & Plan    New onset right-sided heart failure preserved EF with CKD stage III Severe tricuspid regurgitation  Started on treatment for heart failure and received Lasix then became hypotensive and had decline in renal function in the emergency department. BNP 1000, chest x-ray with congestion but no edema. Since then diuretics and eplerinone have been withheld with some improvement of kidney function, but no updated labs this morning. Previously was being managed on 40 mg of torsemide daily, eplerenone 25 mg daily, and potassium supplementation.  He is intolerant to SGLT2 inhibitors due to prior yeast infection.  He has been followed by Dr. Marlou Porch and sees him regularly.  He is supposed to be on 2 L of oxygen at home but is noncompliant at home. Unable to maximize GDMT due to previous contraindications and declining renal function.   -Auto-diuresing - would monitor output without lasix.  Creatinine continues to improve, now 1.17 - continue metoprolol 12.5 mg BID - TEE Friday - refused CPAP QHS   Acute respiratory failure with hypoxia  secondary to pulmonary embolism -Will eventually need to transition to eliquis. - currently on heparin infusion. No signs of major or minor bleeding.  - neg for DVT 04/18/22 - 2L o2 [place]   Strep pyogenes bacteremia - TEE scheduled for Friday   CAD s/p CABG x 4 in 2005 Aortic atherosclerosis  HLD with LDL goal <70 - continue 81 mg  aspirin and rosuvastatin '20mg'$  - denies any chest pain on this admission   HTN See above in the context of CHF management.   OSA - Now has refused CPAP again  Hypomagnesemia -1.8 today- will give another 2G today     For questions or updates, please contact [place] Please consult [email or web address] for contact info under   Pixie Casino, MD, FACC, [place] Director of the Advanced Lipid Disorders &  Cardiovascular Risk Reduction Clinic Diplomate of the American Board of Clinical Lipidology Attending Cardiologist  Direct Dial: [phone]  Fax: [phone]  Website:  [email or web address]  Pixie Casino, MD  04/24/2022, 10:04 AM

## 2022-04-24 NOTE — Progress Notes (Signed)
Physical Therapy Treatment Patient Details Name: [name] [name] MRN: [id number] DOB: Apr 08, 1955 Today's Date: 04/24/2022   History of Present Illness Pt is a 67yo male who came to ED with back pain. Pt found to have R upper lobe PE, now on heparin drip. PMH:CAD s/p CABG in [id number], diastolic dysfunction, cirrhosis of the liver, HTN, CKD 3A, DM II, sleep apnea, R bundle branch block, obesity, peripheral neuropathy from DM, chronic LE edema, recent Bell's Palsy    PT Comments    Pt tolerated treatment well today. Pt was able to progress ambulation in hallway with new bariatric rollator that was delivered to room. Pt still requires Mod A for sit to stand however once up pt moves fairly well. No change in DC/DME recs at this time. PT will continue to follow.     Recommendations for follow up therapy are one component of a multi-disciplinary discharge planning process, led by the attending physician.  Recommendations may be updated based on patient status, additional functional criteria and insurance authorization.  Follow Up Recommendations  Home health PT     Assistance Recommended at Discharge Frequent or constant Supervision/Assistance  Patient can return home with the following A little help with walking and/or transfers;A little help with bathing/dressing/bathroom;Help with stairs or ramp for entrance   Equipment Recommendations  Other (comment) (Needed DME already in room)    Recommendations for Other Services       Precautions / Restrictions Precautions Precautions: Fall Precaution Comments: watch SPO2 Restrictions Weight Bearing Restrictions: No     Mobility  Bed Mobility               General bed mobility comments: Pt received on BSC    Transfers Overall transfer level: Needs assistance Equipment used: Rollator (4 wheels) Transfers: Sit to/from Stand Sit to Stand: Mod assist           General transfer comment: due to body habitus pt unable to bring trunk  forward in chair from reclined position, modA required from PT, minA to power up from chair, pt required use of rocking momentum to get up into standing    Ambulation/Gait Ambulation/Gait assistance: Min guard Gait Distance (Feet): 100 Feet Assistive device: Rollator (4 wheels) Gait Pattern/deviations: Step-to pattern, Decreased stride length, Wide base of support Gait velocity: dec     General Gait Details: no LOB noted   Stairs             Wheelchair Mobility    Modified Rankin (Stroke Patients Only)       Balance Overall balance assessment: Needs assistance Sitting-balance support: Feet supported, No upper extremity supported Sitting balance-Leahy Scale: Good     Standing balance support: Bilateral upper extremity supported, During functional activity, Reliant on assistive device for balance Standing balance-Leahy Scale: Fair                              Cognition Arousal/Alertness: Awake/alert Behavior During Therapy: WFL for tasks assessed/performed Overall Cognitive Status: Within Functional Limits for tasks assessed                                          Exercises      General Comments General comments (skin integrity, edema, etc.): VSS on 2L. Pt in NAD      Pertinent Vitals/Pain Pain Assessment Pain Assessment: Faces  Faces Pain Scale: Hurts a little bit Pain Location: back Pain Descriptors / Indicators: Aching Pain Intervention(s): Monitored during session    Home Living                          Prior Function            PT Goals (current goals can now be found in the care plan section) Progress towards PT goals: Progressing toward goals    Frequency    Min 3X/week      PT Plan Current plan remains appropriate    Co-evaluation              AM-PAC PT "6 Clicks" Mobility   Outcome Measure  Help needed turning from your back to your side while in a flat bed without using  bedrails?: A Little Help needed moving from lying on your back to sitting on the side of a flat bed without using bedrails?: A Little Help needed moving to and from a bed to a chair (including a wheelchair)?: A Little Help needed standing up from a chair using your arms (e.g., wheelchair or bedside chair)?: A Lot Help needed to walk in hospital room?: A Little Help needed climbing 3-5 steps with a railing? : A Lot 6 Click Score: 16    End of Session Equipment Utilized During Treatment: Gait belt;Oxygen Activity Tolerance: Patient tolerated treatment well Patient left: Other (comment) (On BSC) Nurse Communication: Mobility status;Other (comment) (Pt left on BSC) PT Visit Diagnosis: Unsteadiness on feet (R26.81);Difficulty in walking, not elsewhere classified (R26.2)     Time: [id number] PT Time Calculation (min) (ACUTE ONLY): 25 min  Charges:  $Gait Training: 8-22 mins $Therapeutic Activity: 8-22 mins                     Shelby Mattocks, PT, DPT Acute Rehab Services [id number]    Viann Shove 04/24/2022, 12:10 PM

## 2022-04-24 NOTE — Progress Notes (Signed)
Patient refusing cpap, has not worn since 04/19/2022

## 2022-04-24 NOTE — Progress Notes (Signed)
PROGRESS NOTE    [name] [name]  [id number] DOB: 1956-02-03 DOA: 04/16/2022 PCP: [name], MD    Brief Narrative:  67 year old male with coronary artery disease status post CABG in [id number], diastolic heart failure, cirrhosis of liver, ectopic atrial rhythm, hypertension, CKD 3A, diabetes mellitus type 2, sleep apnea, morbid obesity, peripheral neuropathy and varicose veins who presented to the hospital for back pain and found to be hypoxemic.  He was started on treatment for heart failure.  After receiving Lasix Cozaar and Lopressor, he became hypotensive in the ED. VQ scan on 3/8 showed pulmonary embolism, cultures ended up growing streptococcal pyogenes.  ID and cardiology was consulted.  Currently on IV penicillin, planning for TEE on Friday.  Currently trying to achieve fluid balance.   Assessment & Plan:   Acute respiratory failure with hypoxemia: Currently remains on 3 to 4 L of oxygen.  Not on oxygen at home.  Multifactorial. Right upper lobe pulmonary embolism, currently on heparin drip until procedures.  Lower extremity Dopplers negative for DVT.  Will treat with Eliquis after surgical procedures. Right-sided heart failure: Treated with aggressive diuresis and now on diuretics holiday.  On Lopressor.  Followed by cardiology. Continue to mobilize.  Wean off oxygen to keep saturation more than 90%.  Chest physiotherapy.  Streptococcal pyogenes bacteremia: Currently on penicillin.  Echocardiogram with severe TR.  ID and cardiology following.  CT scan showed multiple mandible lucencies.  TEE is scheduled for 3/15. Likely source of infection is left-sided mandibular teeth.  No drainable fluid collection.  Will need outpatient dental surgery follow up.  Uncontrolled type 2 diabetes, hypoglycemia: Patient is on long-acting insulin and prandial insulin.  Episode of hypoglycemia again today morning.  Decrease long-acting insulin to 20 units daily.  .  Acute kidney injury on chronic  kidney disease stage IIIa: Baseline creatinine about 1.7.  Peaked to 2.9.  Creatinine is trending down and further improves.  Diuretics holiday.  Hypomagnesemia: Replaced aggressively.  Recheck tomorrow morning.  Bell's palsy: Completed p.o. prednisone.  OSA: Declines CPAP most of the time..    DVT prophylaxis: Place TED hose Start: 04/22/22 0909   Code Status: Full code Family Communication: None at the bedside Disposition Plan: Status is: Inpatient Remains inpatient appropriate because: On active treatment, antibiotics, inpatient procedures planned     Consultants:  Cardiology ID  Procedures:  None  Antimicrobials:  Penicillin   Subjective:  No new events.  He is very excited to get devices at the bedside to go home.  He is looking forward to procedure tomorrow and subsequently go home.  Objective: Vitals:   04/23/22 2049 04/24/22 0020 04/24/22 0407 04/24/22 0828  BP: 127/76 110/68 131/77 115/68  Pulse: 81 77 62 78  Resp: '14 12 14 11  '$ Temp: (!) 97.5 F (36.4 C) 98.4 F (36.9 C) 98.1 F (36.7 C) 98.1 F (36.7 C)  TempSrc: Oral Oral Oral Oral  SpO2: 94% 96% 97% 95%  Weight:      Height:        Intake/Output Summary (Last 24 hours) at 04/24/2022 1252 Last data filed at 04/24/2022 0900 Gross per 24 hour  Intake 2112.52 ml  Output 3351 ml  Net -1238.48 ml    Filed Weights   04/21/22 0009 04/22/22 0333 04/23/22 0003  Weight: (!) 139.5 kg (!) 137.3 kg (!) 137.3 kg    Examination:  General exam: Appears calm and comfortable.  Sitting at the edge of the bed. Obese gentleman on minimal oxygen. Respiratory  system: Difficult to auscultate.  No added sounds. Cardiovascular system: S1 & S2 heard, RRR.  1+ bilateral pedal edema.  Obese extremities. Gastrointestinal system: Soft.  Nontender.  Pendulous.   Central nervous system: Alert and oriented. No focal neurological deficits.    Data Reviewed: I have personally reviewed following labs and imaging  studies  CBC: Recent Labs  Lab 04/20/22 0416 04/21/22 0046 04/22/22 0058 04/23/22 0034 04/24/22 0048  WBC 13.8* 13.4* 12.2* 11.7* 7.5  NEUTROABS 10.0*  --   --   --   --   HGB 13.7 13.8 13.8 13.7 15.6  HCT 39.1 39.2 41.6 39.2 46.3  MCV 99.2 98.5 100.7* 99.5 101.1*  PLT 145* 165 188 184 0000000    Basic Metabolic Panel: Recent Labs  Lab 04/19/22 0124 04/20/22 0416 04/22/22 0058 04/23/22 0034 04/24/22 0048  NA 129* 130* 136 134* 134*  K 4.3 3.9 3.5 3.5 3.7  CL 97* 98 99 99 98  CO2 20* '22 28 27 26  '$ GLUCOSE 262* 223* 51* 102* 102*  BUN 42* 37* '19 11 9  '$ CREATININE 2.36* 1.74* 1.51* 1.25* 1.17  CALCIUM 8.3* 8.7* 8.7* 8.6* 8.5*  MG  --  2.1 1.7 1.5* 1.8  PHOS  --  2.5  --   --   --     GFR: Estimated Creatinine Clearance: 85.6 mL/min (by C-G formula based on SCr of 1.17 mg/dL). Liver Function Tests: Recent Labs  Lab 04/18/22 0050 04/20/22 0416 04/22/22 0058 04/23/22 0034 04/24/22 0048  AST 75* 63* 34 23 22  ALT 28 49* 33 25 21  ALKPHOS 99 104 141* 145* 145*  BILITOT 3.2* 1.9* 1.3* 1.3* 1.8*  PROT 7.9 7.4 7.5 7.5 8.0  ALBUMIN 2.8* 2.4* 2.3* 2.2* 2.2*    No results for input(s): "LIPASE", "AMYLASE" in the last 168 hours.  No results for input(s): "AMMONIA" in the last 168 hours. Coagulation Profile: No results for input(s): "INR", "PROTIME" in the last 168 hours. Cardiac Enzymes: No results for input(s): "CKTOTAL", "CKMB", "CKMBINDEX", "TROPONINI" in the last 168 hours. BNP (last 3 results) No results for input(s): "PROBNP" in the last 8760 hours. HbA1C: No results for input(s): "HGBA1C" in the last 72 hours. CBG: Recent Labs  Lab 04/23/22 1616 04/23/22 2059 04/24/22 0557 04/24/22 0634 04/24/22 1118  GLUCAP 142* 148* 53* 88 156*    Lipid Profile: No results for input(s): "CHOL", "HDL", "LDLCALC", "TRIG", "CHOLHDL", "LDLDIRECT" in the last 72 hours. Thyroid Function Tests: No results for input(s): "TSH", "T4TOTAL", "FREET4", "T3FREE", "THYROIDAB" in  the last 72 hours. Anemia Panel: No results for input(s): "VITAMINB12", "FOLATE", "FERRITIN", "TIBC", "IRON", "RETICCTPCT" in the last 72 hours. Sepsis Labs: No results for input(s): "PROCALCITON", "LATICACIDVEN" in the last 168 hours.   Recent Results (from the past 240 hour(s))  Resp panel by RT-PCR (RSV, Flu A&B, Covid) Urine, Clean Catch     Status: None   Collection Time: 04/17/22  6:14 AM   Specimen: Urine, Clean Catch; Nasal Swab  Result Value Ref Range Status   SARS Coronavirus 2 by RT PCR NEGATIVE NEGATIVE Final   Influenza A by PCR NEGATIVE NEGATIVE Final   Influenza B by PCR NEGATIVE NEGATIVE Final    Comment: (NOTE) The Xpert Xpress SARS-CoV-2/FLU/RSV plus assay is intended as an aid in the diagnosis of influenza from Nasopharyngeal swab specimens and should not be used as a sole basis for treatment. Nasal washings and aspirates are unacceptable for Xpert Xpress SARS-CoV-2/FLU/RSV testing.  Fact Sheet for Patients: EntrepreneurPulse.com.au  Fact Sheet for Healthcare Providers: IncredibleEmployment.be  This test is not yet approved or cleared by the [place] FDA and has been authorized for detection and/or diagnosis of SARS-CoV-2 by FDA under an Emergency Use Authorization (EUA). This EUA will remain in effect (meaning this test can be used) for the duration of the COVID-19 declaration under Section 564(b)(1) of the Act, 21 U.S.C. section 360bbb-3(b)(1), unless the authorization is terminated or revoked.     Resp Syncytial Virus by PCR NEGATIVE NEGATIVE Final    Comment: (NOTE) Fact Sheet for Patients: EntrepreneurPulse.com.au  Fact Sheet for Healthcare Providers: IncredibleEmployment.be  This test is not yet approved or cleared by the [place] FDA and has been authorized for detection and/or diagnosis of SARS-CoV-2 by FDA under an Emergency Use Authorization (EUA). This EUA will  remain in effect (meaning this test can be used) for the duration of the COVID-19 declaration under Section 564(b)(1) of the Act, 21 U.S.C. section 360bbb-3(b)(1), unless the authorization is terminated or revoked.  Performed at [facility], [place] [address] [place], [place] [address]   Respiratory (~20 pathogens) panel by PCR     Status: None   Collection Time: 04/17/22  7:19 AM   Specimen: Nasopharyngeal Swab; Respiratory  Result Value Ref Range Status   Adenovirus NOT DETECTED NOT DETECTED Final   Coronavirus 229E NOT DETECTED NOT DETECTED Final    Comment: (NOTE) The Coronavirus on the Respiratory Panel, DOES NOT test for the novel  Coronavirus (2019 nCoV)    Coronavirus HKU1 NOT DETECTED NOT DETECTED Final   Coronavirus NL63 NOT DETECTED NOT DETECTED Final   Coronavirus OC43 NOT DETECTED NOT DETECTED Final   Metapneumovirus NOT DETECTED NOT DETECTED Final   Rhinovirus / Enterovirus NOT DETECTED NOT DETECTED Final   Influenza A NOT DETECTED NOT DETECTED Final   Influenza B NOT DETECTED NOT DETECTED Final   Parainfluenza Virus 1 NOT DETECTED NOT DETECTED Final   Parainfluenza Virus 2 NOT DETECTED NOT DETECTED Final   Parainfluenza Virus 3 NOT DETECTED NOT DETECTED Final   Parainfluenza Virus 4 NOT DETECTED NOT DETECTED Final   Respiratory Syncytial Virus NOT DETECTED NOT DETECTED Final   Bordetella pertussis NOT DETECTED NOT DETECTED Final   Bordetella Parapertussis NOT DETECTED NOT DETECTED Final   Chlamydophila pneumoniae NOT DETECTED NOT DETECTED Final   Mycoplasma pneumoniae NOT DETECTED NOT DETECTED Final    Comment: Performed at [facility], [place]. [address] [place], [place] [address]  Culture, blood (Routine X 2) w Reflex to ID Panel     Status: Abnormal   Collection Time: 04/17/22  8:23 AM   Specimen: BLOOD  Result Value Ref Range Status   Specimen Description BLOOD RIGHT ANTECUBITAL  Final   Special Requests   Final    BOTTLES DRAWN AEROBIC AND  ANAEROBIC Blood Culture results may not be optimal due to an inadequate volume of blood received in culture bottles   Culture  Setup Time   Final    GRAM POSITIVE COCCI IN CHAINS IN BOTH AEROBIC AND ANAEROBIC BOTTLES CRITICAL RESULT CALLED TO, READ BACK BY AND VERIFIED WITH: PHARMD LISA CURRAN ON 04/17/22 @ 2151 BY DRT    Culture (A)  Final    STREPTOCOCCUS PYOGENES HEALTH DEPARTMENT NOTIFIED Performed at [facility], [place] [address], [place], [place] [address]    Report Status 04/19/2022 FINAL  Final   Organism ID, Bacteria STREPTOCOCCUS PYOGENES  Final      Susceptibility   Streptococcus  pyogenes - MIC*    PENICILLIN <=0.06 SENSITIVE Sensitive     CEFTRIAXONE <=0.12 SENSITIVE Sensitive     ERYTHROMYCIN <=0.12 SENSITIVE Sensitive     LEVOFLOXACIN 0.5 SENSITIVE Sensitive     VANCOMYCIN 0.5 SENSITIVE Sensitive     * STREPTOCOCCUS PYOGENES  Blood Culture ID Panel (Reflexed)     Status: Abnormal   Collection Time: 04/17/22  8:23 AM  Result Value Ref Range Status   Enterococcus faecalis NOT DETECTED NOT DETECTED Final   Enterococcus Faecium NOT DETECTED NOT DETECTED Final   Listeria monocytogenes NOT DETECTED NOT DETECTED Final   Staphylococcus species NOT DETECTED NOT DETECTED Final   Staphylococcus aureus (BCID) NOT DETECTED NOT DETECTED Final   Staphylococcus epidermidis NOT DETECTED NOT DETECTED Final   Staphylococcus lugdunensis NOT DETECTED NOT DETECTED Final   Streptococcus species DETECTED (A) NOT DETECTED Final    Comment: CRITICAL RESULT CALLED TO, READ BACK BY AND VERIFIED WITH: PHARMD LISA CURRAN ON 04/17/22 @ 2151 BY DRT    Streptococcus agalactiae NOT DETECTED NOT DETECTED Final   Streptococcus pneumoniae NOT DETECTED NOT DETECTED Final   Streptococcus pyogenes DETECTED (A) NOT DETECTED Final    Comment: CRITICAL RESULT CALLED TO, READ BACK BY AND VERIFIED WITH: PHARMD LISA CURRAN ON 04/17/22 @ 2151 BY DRT    A.calcoaceticus-baumannii NOT DETECTED NOT DETECTED Final    Bacteroides fragilis NOT DETECTED NOT DETECTED Final   Enterobacterales NOT DETECTED NOT DETECTED Final   Enterobacter cloacae complex NOT DETECTED NOT DETECTED Final   Escherichia coli NOT DETECTED NOT DETECTED Final   Klebsiella aerogenes NOT DETECTED NOT DETECTED Final   Klebsiella oxytoca NOT DETECTED NOT DETECTED Final   Klebsiella pneumoniae NOT DETECTED NOT DETECTED Final   Proteus species NOT DETECTED NOT DETECTED Final   Salmonella species NOT DETECTED NOT DETECTED Final   Serratia marcescens NOT DETECTED NOT DETECTED Final   Haemophilus influenzae NOT DETECTED NOT DETECTED Final   Neisseria meningitidis NOT DETECTED NOT DETECTED Final   Pseudomonas aeruginosa NOT DETECTED NOT DETECTED Final   Stenotrophomonas maltophilia NOT DETECTED NOT DETECTED Final   Candida albicans NOT DETECTED NOT DETECTED Final   Candida auris NOT DETECTED NOT DETECTED Final   Candida glabrata NOT DETECTED NOT DETECTED Final   Candida krusei NOT DETECTED NOT DETECTED Final   Candida parapsilosis NOT DETECTED NOT DETECTED Final   Candida tropicalis NOT DETECTED NOT DETECTED Final   Cryptococcus neoformans/gattii NOT DETECTED NOT DETECTED Final    Comment: Performed at [facility], [address], [place], [place] [address]  Culture, blood (Routine X 2) w Reflex to ID Panel     Status: Abnormal   Collection Time: 04/17/22  8:30 AM   Specimen: BLOOD LEFT FOREARM  Result Value Ref Range Status   Specimen Description BLOOD LEFT FOREARM  Final   Special Requests   Final    BOTTLES DRAWN AEROBIC AND ANAEROBIC Blood Culture adequate volume   Culture  Setup Time   Final    GRAM POSITIVE COCCI IN CHAINS IN BOTH AEROBIC AND ANAEROBIC BOTTLES CRITICAL VALUE NOTED.  VALUE IS CONSISTENT WITH PREVIOUSLY REPORTED AND CALLED VALUE.    Culture (A)  Final    STREPTOCOCCUS PYOGENES SUSCEPTIBILITIES PERFORMED ON PREVIOUS CULTURE WITHIN THE LAST 5 DAYS. Performed at [facility], [place] [address],  [place], [place] [address]    Report Status 04/19/2022 FINAL  Final  Culture, blood (Routine X 2) w Reflex to ID Panel     Status:  None   Collection Time: 04/19/22  1:23 AM   Specimen: BLOOD LEFT HAND  Result Value Ref Range Status   Specimen Description BLOOD LEFT HAND  Final   Special Requests   Final    BOTTLES DRAWN AEROBIC ONLY Blood Culture results may not be optimal due to an inadequate volume of blood received in culture bottles   Culture   Final    NO GROWTH 5 DAYS Performed at [facility], [place] [address] [place], [place] [address]    Report Status 04/24/2022 FINAL  Final  Culture, blood (Routine X 2) w Reflex to ID Panel     Status: None   Collection Time: 04/19/22  1:25 AM   Specimen: BLOOD RIGHT HAND  Result Value Ref Range Status   Specimen Description BLOOD RIGHT HAND  Final   Special Requests   Final    BOTTLES DRAWN AEROBIC AND ANAEROBIC Blood Culture adequate volume   Culture   Final    NO GROWTH 5 DAYS Performed at [facility], [place] [address] [place], [place] [address]    Report Status 04/24/2022 FINAL  Final  Urine Culture (for pregnant, neutropenic or urologic patients or patients with an indwelling urinary catheter)     Status: None   Collection Time: 04/21/22  3:13 AM   Specimen: Urine, Clean Catch  Result Value Ref Range Status   Specimen Description URINE, CLEAN CATCH  Final   Special Requests NONE  Final   Culture   Final    NO GROWTH Performed at [facility], [place] [address], [place], [place] [address]    Report Status 04/22/2022 FINAL  Final         Radiology Studies: VAS [place] UPPER EXTREMITY VENOUS DUPLEX  Result Date: 04/22/2022 UPPER VENOUS STUDY  Patient Name:  [name] [name]  Date of Exam:   04/22/2022 Medical Rec #: [id number]        Accession #:    [id number] Date of Birth: 02/17/1955        Patient Gender: M Patient Age:   40 years Exam Location:  [facility] Procedure:      VAS [place] UPPER EXTREMITY VENOUS DUPLEX  Referring Phys: TRUNG VU --------------------------------------------------------------------------------  Indications: Swelling Anticoagulation: Heparin. Comparison Study: No prior study. Performing Technologist: McKayla Maag RVT, [place]  Examination Guidelines: A complete evaluation includes B-mode imaging, spectral Doppler, color Doppler, and power Doppler as needed of all accessible portions of each vessel. Bilateral testing is considered an integral part of a complete examination. Limited examinations for reoccurring indications may be performed as noted.  Right Findings: +----------+------------+---------+-----------+----------+-------+ RIGHT     CompressiblePhasicitySpontaneousPropertiesSummary +----------+------------+---------+-----------+----------+-------+ IJV           Full       Yes       Yes                      +----------+------------+---------+-----------+----------+-------+ Subclavian    Full       Yes       Yes                      +----------+------------+---------+-----------+----------+-------+ Axillary      Full       Yes       Yes                      +----------+------------+---------+-----------+----------+-------+ Brachial      Full  Yes       Yes                      +----------+------------+---------+-----------+----------+-------+ Radial        Full                                          +----------+------------+---------+-----------+----------+-------+ Ulnar         Full                                          +----------+------------+---------+-----------+----------+-------+ Cephalic      None       No        No                Acute  +----------+------------+---------+-----------+----------+-------+ Basilic       Full                                          +----------+------------+---------+-----------+----------+-------+ SVT in cephalic vein mid upper arm to wrist. SVT in branch off basilic vein at [facility].  Left  Findings: +----------+------------+---------+-----------+----------+-------+ LEFT      CompressiblePhasicitySpontaneousPropertiesSummary +----------+------------+---------+-----------+----------+-------+ Subclavian    Full       Yes       Yes                      +----------+------------+---------+-----------+----------+-------+  Summary:  Right: Findings consistent with acute superficial vein thrombosis involving the right cephalic vein. As well as acute SVT in branch off basilic vein at [facility] fossa.  Left: No evidence of thrombosis in the subclavian.  *See table(s) above for measurements and observations.  Diagnosing physician: Servando Snare MD Electronically signed by Servando Snare MD on 04/22/2022 at 4:52:29 PM.    Final         Scheduled Meds:  artificial tears   Right Eye Q4H   aspirin EC  81 mg Oral Daily   dextrose       famotidine  20 mg Oral Daily   insulin aspart  0-20 Units Subcutaneous TID WC   insulin aspart  0-5 Units Subcutaneous QHS   insulin aspart  4 Units Subcutaneous TID WC   insulin glargine-yfgn  20 Units Subcutaneous Daily   metoprolol tartrate  12.5 mg Oral BID   pantoprazole  40 mg Oral Daily   polyethylene glycol  17 g Oral Daily   rosuvastatin  20 mg Oral QPM   sodium chloride flush  3 mL Intravenous Q12H   Continuous Infusions:  heparin 2,050 Units/hr (04/23/22 1515)   magnesium sulfate bolus IVPB 2 g (04/24/22 1212)   penicillin G potassium 12 Million Units in dextrose 5 % 500 mL CONTINUOUS infusion 12 Million Units (04/24/22 0939)     LOS: 6 days    Time spent: 35 minutes    Barb Merino, MD [facility] Pager [phone]

## 2022-04-25 ENCOUNTER — Inpatient Hospital Stay (HOSPITAL_COMMUNITY): Payer: 59

## 2022-04-25 ENCOUNTER — Inpatient Hospital Stay (HOSPITAL_COMMUNITY): Payer: 59 | Admitting: Critical Care Medicine

## 2022-04-25 ENCOUNTER — Encounter (HOSPITAL_COMMUNITY)
Admission: EM | Disposition: A | Discharge status: Home Health | Payer: Self-pay | Source: Home / Self Care | Attending: Internal Medicine

## 2022-04-25 ENCOUNTER — Encounter (HOSPITAL_COMMUNITY): Payer: Self-pay | Admitting: Family Medicine

## 2022-04-25 ENCOUNTER — Other Ambulatory Visit (HOSPITAL_COMMUNITY): Payer: Self-pay

## 2022-04-25 DIAGNOSIS — I361 Nonrheumatic tricuspid (valve) insufficiency: Secondary | ICD-10-CM

## 2022-04-25 DIAGNOSIS — E1122 Type 2 diabetes mellitus with diabetic chronic kidney disease: Secondary | ICD-10-CM

## 2022-04-25 DIAGNOSIS — N189 Chronic kidney disease, unspecified: Secondary | ICD-10-CM

## 2022-04-25 DIAGNOSIS — Z794 Long term (current) use of insulin: Secondary | ICD-10-CM

## 2022-04-25 DIAGNOSIS — I130 Hypertensive heart and chronic kidney disease with heart failure and stage 1 through stage 4 chronic kidney disease, or unspecified chronic kidney disease: Secondary | ICD-10-CM

## 2022-04-25 DIAGNOSIS — I252 Old myocardial infarction: Secondary | ICD-10-CM

## 2022-04-25 DIAGNOSIS — R7881 Bacteremia: Secondary | ICD-10-CM

## 2022-04-25 DIAGNOSIS — J9601 Acute respiratory failure with hypoxia: Secondary | ICD-10-CM | POA: Diagnosis not present

## 2022-04-25 DIAGNOSIS — I2609 Other pulmonary embolism with acute cor pulmonale: Secondary | ICD-10-CM | POA: Diagnosis not present

## 2022-04-25 DIAGNOSIS — I2510 Atherosclerotic heart disease of native coronary artery without angina pectoris: Secondary | ICD-10-CM

## 2022-04-25 DIAGNOSIS — I499 Cardiac arrhythmia, unspecified: Secondary | ICD-10-CM

## 2022-04-25 DIAGNOSIS — I340 Nonrheumatic mitral (valve) insufficiency: Secondary | ICD-10-CM

## 2022-04-25 HISTORY — PX: TEE WITHOUT CARDIOVERSION: SHX5443

## 2022-04-25 LAB — CBC
HCT: 39.8 % (ref 39.0–52.0)
Hemoglobin: 13.3 g/dL (ref 13.0–17.0)
MCH: 33.6 pg (ref 26.0–34.0)
MCHC: 33.4 g/dL (ref 30.0–36.0)
MCV: 100.5 fL — ABNORMAL HIGH (ref 80.0–100.0)
Platelets: 247 10*3/uL (ref 150–400)
RBC: 3.96 MIL/uL — ABNORMAL LOW (ref 4.22–5.81)
RDW: 15.3 % (ref 11.5–15.5)
WBC: 7.6 10*3/uL (ref 4.0–10.5)
nRBC: 0 % (ref 0.0–0.2)

## 2022-04-25 LAB — COMPREHENSIVE METABOLIC PANEL
ALT: 20 U/L (ref 0–44)
AST: 18 U/L (ref 15–41)
Albumin: 2.2 g/dL — ABNORMAL LOW (ref 3.5–5.0)
Alkaline Phosphatase: 144 U/L — ABNORMAL HIGH (ref 38–126)
Anion gap: 8 (ref 5–15)
BUN: 7 mg/dL — ABNORMAL LOW (ref 8–23)
CO2: 23 mmol/L (ref 22–32)
Calcium: 8.5 mg/dL — ABNORMAL LOW (ref 8.9–10.3)
Chloride: 101 mmol/L (ref 98–111)
Creatinine, Ser: 1.18 mg/dL (ref 0.61–1.24)
GFR, Estimated: >60 mL/min (ref >60)
Glucose, Bld: 141 mg/dL — ABNORMAL HIGH (ref 70–99)
Potassium: 3.2 mmol/L — ABNORMAL LOW (ref 3.5–5.1)
Sodium: 132 mmol/L — ABNORMAL LOW (ref 135–145)
Total Bilirubin: 1.6 mg/dL — ABNORMAL HIGH (ref 0.3–1.2)
Total Protein: 8.2 g/dL — ABNORMAL HIGH (ref 6.5–8.1)

## 2022-04-25 LAB — GLUCOSE, CAPILLARY
Glucose-Capillary: 113 mg/dL — ABNORMAL HIGH (ref 70–99)
Glucose-Capillary: 96 mg/dL (ref 70–99)
Glucose-Capillary: 82 mg/dL (ref 70–99)
Glucose-Capillary: 108 mg/dL — ABNORMAL HIGH (ref 70–99)
Glucose-Capillary: 142 mg/dL — ABNORMAL HIGH (ref 70–99)

## 2022-04-25 LAB — ECHO TEE

## 2022-04-25 LAB — HEPARIN LEVEL (UNFRACTIONATED): Heparin Unfractionated: 0.46 IU/mL (ref 0.30–0.70)

## 2022-04-25 LAB — MAGNESIUM: Magnesium: 1.7 mg/dL (ref 1.7–2.4)

## 2022-04-25 SURGERY — ECHOCARDIOGRAM, TRANSESOPHAGEAL
Anesthesia: Monitor Anesthesia Care

## 2022-04-25 MED ORDER — SODIUM CHLORIDE 0.9 % IV SOLN: INTRAVENOUS | Status: DC

## 2022-04-25 MED ORDER — METOPROLOL TARTRATE 25 MG PO TABS
12.5000 mg | ORAL_TABLET | Freq: Two times a day (BID) | ORAL | 0 refills | Status: DC
  Filled 2022-04-25: qty 30, 30d supply, fill #0

## 2022-04-25 MED ORDER — AMOXICILLIN 500 MG PO CAPS
1000.0000 mg | ORAL_CAPSULE | Freq: Three times a day (TID) | ORAL | 0 refills | Status: AC
  Filled 2022-04-25: qty 42, 7d supply, fill #0

## 2022-04-25 MED ORDER — POTASSIUM CHLORIDE CRYS ER 20 MEQ PO TBCR
60.0000 meq | EXTENDED_RELEASE_TABLET | Freq: Once | ORAL | Status: AC
  Administered 2022-04-25: 60 meq via ORAL
  Filled 2022-04-25: qty 3

## 2022-04-25 MED ORDER — MAGNESIUM OXIDE -MG SUPPLEMENT 400 (240 MG) MG PO TABS
400.0000 mg | ORAL_TABLET | Freq: Two times a day (BID) | ORAL | Status: DC
  Administered 2022-04-25 – 2022-04-26 (×2): 400 mg via ORAL
  Filled 2022-04-25 (×2): qty 1

## 2022-04-25 MED ORDER — MIDAZOLAM HCL 5 MG/5ML IJ SOLN
INTRAMUSCULAR | Status: DC | PRN
  Administered 2022-04-25: 2 mg via INTRAVENOUS

## 2022-04-25 MED ORDER — KETAMINE HCL 50 MG/5ML IJ SOSY
PREFILLED_SYRINGE | INTRAMUSCULAR | Status: AC
  Filled 2022-04-25: qty 5

## 2022-04-25 MED ORDER — PROPOFOL 500 MG/50ML IV EMUL
INTRAVENOUS | Status: DC | PRN
  Administered 2022-04-25: 25 ug/kg/min via INTRAVENOUS

## 2022-04-25 MED ORDER — SPIRONOLACTONE 12.5 MG HALF TABLET
12.5000 mg | ORAL_TABLET | Freq: Every day | ORAL | Status: DC
  Administered 2022-04-25 – 2022-04-26 (×2): 12.5 mg via ORAL
  Filled 2022-04-25 (×2): qty 1

## 2022-04-25 MED ORDER — AMOXICILLIN 500 MG PO CAPS
1000.0000 mg | ORAL_CAPSULE | Freq: Three times a day (TID) | ORAL | Status: DC
  Administered 2022-04-25 – 2022-04-26 (×3): 1000 mg via ORAL
  Filled 2022-04-25 (×3): qty 2

## 2022-04-25 MED ORDER — KETAMINE HCL 10 MG/ML IJ SOLN
INTRAMUSCULAR | Status: DC | PRN
  Administered 2022-04-25: 20 mg via INTRAVENOUS
  Administered 2022-04-25: 10 mg via INTRAVENOUS

## 2022-04-25 MED ORDER — LANTUS SOLOSTAR 100 UNIT/ML ~~LOC~~ SOPN: 20.0000 [IU] | PEN_INJECTOR | Freq: Every morning | SUBCUTANEOUS | 11 refills | Status: DC

## 2022-04-25 MED ORDER — MAGNESIUM SULFATE 2 GM/50ML IV SOLN
2.0000 g | Freq: Once | INTRAVENOUS | Status: AC
  Administered 2022-04-25: 2 g via INTRAVENOUS
  Filled 2022-04-25: qty 50

## 2022-04-25 MED ORDER — PROPOFOL 10 MG/ML IV BOLUS
INTRAVENOUS | Status: DC | PRN
  Administered 2022-04-25 (×2): 10 mg via INTRAVENOUS

## 2022-04-25 MED ORDER — ROSUVASTATIN CALCIUM 20 MG PO TABS
20.0000 mg | ORAL_TABLET | Freq: Every evening | ORAL | 0 refills | Status: DC
  Filled 2022-04-25: qty 30, 30d supply, fill #0

## 2022-04-25 MED ORDER — SPIRONOLACTONE 25 MG PO TABS
12.5000 mg | ORAL_TABLET | Freq: Every day | ORAL | 0 refills | Status: DC
  Filled 2022-04-25: qty 15, 30d supply, fill #0

## 2022-04-25 MED ORDER — APIXABAN 5 MG PO TABS: 5.0000 mg | ORAL_TABLET | Freq: Two times a day (BID) | ORAL | Status: DC

## 2022-04-25 MED ORDER — APIXABAN (ELIQUIS) VTE STARTER PACK (10MG AND 5MG)
ORAL_TABLET | ORAL | 0 refills | Status: DC
  Filled 2022-04-25: qty 74, 30d supply, fill #0

## 2022-04-25 MED ORDER — APIXABAN 5 MG PO TABS
10.0000 mg | ORAL_TABLET | Freq: Two times a day (BID) | ORAL | Status: DC
  Administered 2022-04-25 – 2022-04-26 (×2): 10 mg via ORAL
  Filled 2022-04-25 (×3): qty 2

## 2022-04-25 NOTE — Progress Notes (Signed)
ANTICOAGULATION CONSULT NOTE - Follow Up Consult  Pharmacy Consult for Heparin Indication: pulmonary embolus  Allergies  Allergen Reactions   Empagliflozin Rash    Patient reports yeast.    Ibuprofen Other (See Comments)    Stomach pain    Patient Measurements: Height: 5\' 11"  (180.3 cm) Weight: (!) 137 kg (302 lb 0.5 oz) IBW/kg (Calculated) : 75.3 Heparin Dosing Weight: 107 kg  Vital Signs: Temp: 98.6 F (37 C) (03/15 0400) Temp Source: Oral (03/15 0400) BP: 122/62 (03/15 0818) Pulse Rate: 88 (03/15 0818)  Labs: Recent Labs    04/23/22 0034 04/24/22 0048 04/25/22 0050  HGB 13.7 15.6 13.3  HCT 39.2 46.3 39.8  PLT 184 175 247  HEPARINUNFRC 0.40 0.33 0.46  CREATININE 1.25* 1.17 1.18     Estimated Creatinine Clearance: 84.7 mL/min (by C-G formula based on SCr of 1.18 mg/dL).  Assessment: 67 y.o. male with Group A Strep bacteremia/PNA. V/Q suggestive of PE on 3/8. Pharmacy consulted for heparin dosing.  LE duplex negative for DVT on 3/8.  R arm swelling on 3/12 > duplex shows acute superficial vein thrombosis involving R cephalic vein.  Heparin level remains therapeutic (0.46) on 2050 units/hr. Hgb stable, platelet count back into normal range.     Goal of Therapy:  Heparin level 0.3-0.7 units/ml Monitor platelets by anticoagulation protocol: Yes   Plan:  Continue heparin drip at 2050 units/hr Daily heparin level and CBC. Noted plan for TEE on 3/15.   [place] Crane, Roylene Reason, BCCP Clinical Pharmacist  04/25/2022 8:21 AM   [facility] pharmacy phone numbers are listed on [email or web address]

## 2022-04-25 NOTE — TOC Transition Note (Signed)
Discharge medications (5) are being stored in the main pharmacy on the ground floor until patient is ready for discharge.   

## 2022-04-25 NOTE — Progress Notes (Signed)
   Notified by nursing staff and AM call APP that pt needed TEE orders/consent. [name] [name] entered orders. Patient contacted via room phone to expedite conversation. Speculator has been requested to perform a transesophageal echocardiogram on [name] [name] for tricuspid regurgitation and bacteremia.  After careful review of history and examination, the risks and benefits of transesophageal echocardiogram have been explained including risks of esophageal damage, perforation (1:10,000 risk), bleeding, pharyngeal hematoma as well as other potential complications associated with anesthesia including aspiration, arrhythmia, respiratory failure and death. Alternatives to treatment were discussed, questions were answered. Patient is willing to proceed.   Charlie Pitter, PA-C 04/25/2022 7:42 AM

## 2022-04-25 NOTE — Transfer of Care (Signed)
Immediate Anesthesia Transfer of Care Note  Patient: [name] [name]  Procedure(s) Performed: TRANSESOPHAGEAL ECHOCARDIOGRAM (TEE)  Patient Location: PACU  Anesthesia Type:General  Level of Consciousness: awake and alert   Airway & Oxygen Therapy: Patient Spontanous Breathing and Patient connected to nasal cannula oxygen  Post-op Assessment: Report given to RN and Post -op Vital signs reviewed and stable  Post vital signs: Reviewed and stable  Last Vitals:  Vitals Value Taken Time  BP 113/68 04/25/22 0904  Temp    Pulse 76 04/25/22 0906  Resp 25 04/25/22 0906  SpO2 96 % 04/25/22 0906  Vitals shown include unvalidated device data.  Last Pain:  Vitals:   04/25/22 0818  TempSrc:   PainSc: 0-No pain         Complications: No notable events documented.

## 2022-04-25 NOTE — Progress Notes (Signed)
     Transesophageal Echocardiogram Note  MIHAI VERZOSA [id number] [birth year]  Procedure: Transesophageal Echocardiogram Indications: Bacteremia  Procedure Details Consent: Obtained Time Out: Verified patient identification, verified procedure, site/side was marked, verified correct patient position, special equipment/implants available, Radiology Safety Procedures followed,  medications/allergies/relevent history reviewed, required imaging and test results available.  Performed  Medications:  Pt sedated by anesthesia with versed 2 mg, ketamine 30 mg and diprovan 60 mg IV total.  Normal LV function; mild LAE; severe RAE; severe RVE with moderate RV dysfunction; small oscillating density on aortic valve (possible vegetation vs lambl's excrescence); trace AI; mild MR; tricuspid valve leaflets do not coapt likely related to RV dilatation; severe TR.     Complications: No apparent complications Patient did tolerate procedure well.  Kirk Ruths, MD

## 2022-04-25 NOTE — Progress Notes (Signed)
Patient refusing cpap.

## 2022-04-25 NOTE — Anesthesia Preprocedure Evaluation (Signed)
Anesthesia Evaluation  Patient identified by MRN, date of birth, ID band Patient awake    Reviewed: Allergy & Precautions, NPO status , Patient's Chart, lab work & pertinent test results  Airway Mallampati: III  TM Distance: >3 FB     Dental  (+) Dental Advisory Given   Pulmonary sleep apnea , former smoker   breath sounds clear to auscultation       Cardiovascular hypertension, Pt. on medications + CAD, + Past MI, + Peripheral Vascular Disease and +CHF  + dysrhythmias  Rhythm:Regular Rate:Normal     Neuro/Psych  Neuromuscular disease    GI/Hepatic negative GI ROS, Neg liver ROS,,,  Endo/Other  diabetes, Type 2, Insulin Dependent  Morbid obesity  Renal/GU CRF and ARFRenal disease     Musculoskeletal  (+) Arthritis ,    Abdominal   Peds  Hematology negative hematology ROS (+)   Anesthesia Other Findings   Reproductive/Obstetrics                             Anesthesia Physical Anesthesia Plan  ASA: 4  Anesthesia Plan: MAC   Post-op Pain Management:    Induction:   PONV Risk Score and Plan: 1 and Treatment may vary due to age or medical condition  Airway Management Planned: Natural Airway and Nasal Cannula  Additional Equipment: None  Intra-op Plan:   Post-operative Plan:   Informed Consent: I have reviewed the patients History and Physical, chart, labs and discussed the procedure including the risks, benefits and alternatives for the proposed anesthesia with the patient or authorized representative who has indicated his/her understanding and acceptance.       Plan Discussed with: CRNA  Anesthesia Plan Comments:        Anesthesia Quick Evaluation

## 2022-04-25 NOTE — Plan of Care (Signed)

## 2022-04-25 NOTE — Progress Notes (Addendum)
ANTICOAGULATION CONSULT NOTE - Follow Up Consult  Pharmacy Consult for Heparin Indication: pulmonary embolus  Allergies  Allergen Reactions   Empagliflozin Rash    Patient reports yeast.    Ibuprofen Other (See Comments)    Stomach pain    Patient Measurements: Height: 5\' 11"  (180.3 cm) Weight: (!) 137 kg (302 lb 0.5 oz) IBW/kg (Calculated) : 75.3 Heparin Dosing Weight: 107 kg  Vital Signs: Temp: 98.1 F (36.7 C) (03/15 1142) Temp Source: Oral (03/15 1142) BP: 112/77 (03/15 1142) Pulse Rate: 79 (03/15 0930)  Labs: Recent Labs    04/23/22 0034 04/24/22 0048 04/25/22 0050  HGB 13.7 15.6 13.3  HCT 39.2 46.3 39.8  PLT 184 175 247  HEPARINUNFRC 0.40 0.33 0.46  CREATININE 1.25* 1.17 1.18     Estimated Creatinine Clearance: 84.7 mL/min (by C-G formula based on SCr of 1.18 mg/dL).  Assessment: 66 y.o. male with Group A Strep bacteremia/PNA. V/Q suggestive of PE on 3/8. Pharmacy consulted for heparin dosing.  LE duplex negative for DVT on 3/8.  R arm swelling on 3/12 > duplex shows acute superficial vein thrombosis involving R cephalic vein.  Pharmacy asked to transition IV heparin to Eliquis in anticipation of discharge.     Goal of Therapy:  Heparin level 0.3-0.7 units/ml Monitor platelets by anticoagulation protocol: Yes   Plan:  Stop heparin  Start Eliquis 10 mg po BID x 7 days, then 5 mg BID. Will educate patient prior to discharge Copay $0.  Marguerite Olea, [facility] Clinical Pharmacist  04/25/2022 2:34 PM   [facility] pharmacy phone numbers are listed on [email or web address]

## 2022-04-25 NOTE — Progress Notes (Signed)
[place] for Infectious Disease  Date of Admission:  04/16/2022   Total days of inpatient antibiotics 4  Principal Problem:   Acute respiratory failure with hypoxia (HCC) Active Problems:   Bacteremia   Severe tricuspid regurgitation   Pulmonary embolism (HCC)   RVF (right ventricular failure) (HCC)   AKI (acute kidney injury) ([place])          Assessment: 66 YM admitted with respiratory failure secondary to CHF exacerbation, strep pyogenes bacteremia: Presented with back pain and fever.   #Streptococcus pyogenes bacteremia  #Bilateral lower extremity wounds-do not appear infected #TV regurg on tte - CT chest showed no acute intrathoracic process.  Stable cardiomegaly. - CT L-spine showed no acute bony abnormality - CT abdomen pelvis no acute findings in abdomen pelvis. MRI L spine did not show discitis/osteo/epidural abscess - TTE showed TV regurg was torrential, due to severe leaflet malcoaptation - Ct max showed left sided mandibular teeth with overlying soft tissue swelling, no drainable fluid collection, this could be source of bacteremia.  - 04/21/22 patient complains of 2 days rue swelling no tenderness -- exam cordlike structure distal bicep --> duplex svt's - 3/15 tee  Normal LV function; mild LAE; severe RAE; severe RVE with moderate RV dysfunction; small oscillating density on aortic valve (possible vegetation vs lambl's excrescence); trace AI; mild MR; tricuspid valve leaflets do not coapt likely related to RV dilatation; severe TR.    I do not suspect the av finding is of endocarditis in nature. TR degeneration likely also not related to IE. GAS is a much lower risk IE agent than viridan strep  -would plan 2 weeks abx total and oral abx transition today is fine -will also see in id clinic for f/u off abx    Recommendations: -stop penicillin g -start amoxicillin high dose 1 gram po three times a day to finish on 3/21 for total 2 week course of  abx -ID clinic f/u on 4/2 @ 1030 with me -ok to discharge from id standpoint when ready by primary team/cardiology -id will sign off -discussed with primary team   I spent more than 50 minute reviewing data/chart, and coordinating care and >50% direct face to face time providing counseling/discussing diagnostics/treatment plan with patient       Microbiology:   Antibiotics: Pen 3/7-  Linezolid 3/7   SUBJECTIVE: Afebrile No pain right arm. No worsening swelling Tee done this morning no obvious veg. Severe tr.   Slight loose stool No n/v/rash   Review of Systems: Review of Systems  All other systems reviewed and are negative.    Scheduled Meds:  artificial tears   Right Eye Q4H   aspirin EC  81 mg Oral Daily   famotidine  20 mg Oral Daily   insulin aspart  0-20 Units Subcutaneous TID WC   insulin aspart  0-5 Units Subcutaneous QHS   insulin aspart  4 Units Subcutaneous TID WC   insulin glargine-yfgn  20 Units Subcutaneous Daily   metoprolol tartrate  12.5 mg Oral BID   pantoprazole  40 mg Oral Daily   polyethylene glycol  17 g Oral Daily   rosuvastatin  20 mg Oral QPM   sodium chloride flush  3 mL Intravenous Q12H   Continuous Infusions:  heparin 2,050 Units/hr (04/25/22 0837)   magnesium sulfate bolus IVPB 2 g (04/25/22 0955)   penicillin G potassium 12 Million Units in dextrose 5 % 500 mL CONTINUOUS infusion 12 Million  Units (04/25/22 0952)   PRN Meds:.acetaminophen **OR** acetaminophen, diclofenac Sodium, guaiFENesin, hydrALAZINE, [COMPLETED] ipratropium-albuterol **FOLLOWED BY** ipratropium-albuterol, metoprolol tartrate, ondansetron **OR** ondansetron (ZOFRAN) IV, pregabalin, senna-docusate, traZODone Allergies  Allergen Reactions   Empagliflozin Rash    Patient reports yeast.    Ibuprofen Other (See Comments)    Stomach pain    OBJECTIVE: Vitals:   04/25/22 0818 04/25/22 0905 04/25/22 0915 04/25/22 0930  BP: 122/62 113/68 109/67 114/70  Pulse: 88  74 79 79  Resp: (!) 25 (!) 21 13 19   Temp:  98.2 F (36.8 C)  98.2 F (36.8 C)  TempSrc:      SpO2: 98% 95% 97% 93%  Weight: (!) 137 kg     Height: 5\' 11"  (1.803 m)      Body mass index is 42.12 kg/m.  Exam: General/constitutional: no distress, pleasant HEENT: Normocephalic, PER, Conj Clear, EOMI, Oropharynx clear Neck supple CV: rrr no mrg Ext: bilateral LE chronic edema 2+; rue cordlike structure distal bicep no warmth/tenderness -- rue does appear a little more swollen than left UE Skin: No Rash Neuro: nonfocal MSK: generalized weakness      Lab Results Lab Results  Component Value Date   WBC 7.6 04/25/2022   HGB 13.3 04/25/2022   HCT 39.8 04/25/2022   MCV 100.5 (H) 04/25/2022   PLT 247 04/25/2022    Lab Results  Component Value Date   CREATININE 1.18 04/25/2022   BUN 7 (L) 04/25/2022   NA 132 (L) 04/25/2022   K 3.2 (L) 04/25/2022   CL 101 04/25/2022   CO2 23 04/25/2022    Lab Results  Component Value Date   ALT 20 04/25/2022   AST 18 04/25/2022   ALKPHOS 144 (H) 04/25/2022   BILITOT 1.6 (H) 04/25/2022        Jabier Mutton, MD [place] [facility] 04/25/2022, 10:24 AM

## 2022-04-25 NOTE — Anesthesia Postprocedure Evaluation (Signed)
Anesthesia Post Note  Patient: [name] [name]  Procedure(s) Performed: TRANSESOPHAGEAL ECHOCARDIOGRAM (TEE)     Patient location during evaluation: PACU Anesthesia Type: MAC Level of consciousness: awake and alert Pain management: pain level controlled Vital Signs Assessment: post-procedure vital signs reviewed and stable Respiratory status: spontaneous breathing, nonlabored ventilation, respiratory function stable and patient connected to nasal cannula oxygen Cardiovascular status: stable and blood pressure returned to baseline Postop Assessment: no apparent nausea or vomiting Anesthetic complications: no  No notable events documented.  Last Vitals:  Vitals:   04/25/22 0915 04/25/22 0930  BP: 109/67 114/70  Pulse: 79 79  Resp: 13 19  Temp:  36.8 C  SpO2: 97% 93%    Last Pain:  Vitals:   04/25/22 0930  TempSrc:   PainSc: 0-No pain                 Tiajuana Amass

## 2022-04-25 NOTE — Progress Notes (Signed)
Nurse requested Mobility Specialist to perform oxygen saturation test with pt which includes removing pt from oxygen both at rest and while ambulating.  Below are the results from that testing.     Patient Saturations on Room Air at Rest = spO2 94%  Patient Saturations on Room Air while Ambulating = sp02 90% .  Marland Kitchen  Reported results to nurse.

## 2022-04-25 NOTE — Interval H&P Note (Signed)
History and Physical Interval Note:  04/25/2022 7:46 AM  [name] [name]  has presented today for surgery, with the diagnosis of suspected endocarditis.  The various methods of treatment have been discussed with the patient and family. After consideration of risks, benefits and other options for treatment, the patient has consented to  Procedure(s): TRANSESOPHAGEAL ECHOCARDIOGRAM (TEE) (N/A) as a surgical intervention.  The patient's history has been reviewed, patient examined, no change in status, stable for surgery.  I have reviewed the patient's chart and labs.  Questions were answered to the patient's satisfaction.     Kirk Ruths

## 2022-04-25 NOTE — Progress Notes (Signed)
PROGRESS NOTE    [name] [name]  [id number] DOB: 05-29-1955 DOA: 04/16/2022 PCP: [name], MD    Brief Narrative:  67 year old male with coronary artery disease status post CABG in [id number], diastolic heart failure, cirrhosis of liver, ectopic atrial rhythm, hypertension, CKD 3A, diabetes mellitus type 2, sleep apnea, morbid obesity, peripheral neuropathy and varicose veins who presented to the hospital for back pain and found to be hypoxemic.  He was started on treatment for heart failure.  After receiving Lasix Cozaar and Lopressor, he became hypotensive in the ED. VQ scan on 3/8 showed pulmonary embolism, cultures ended up growing streptococcal pyogenes.  ID and cardiology was consulted.  Currently on IV penicillin, planning for TEE.  Currently trying to achieve fluid balance.   Assessment & Plan:   Acute respiratory failure with hypoxemia:  Mostly improved.  Today on room air and maintaining about 90%.  Not on oxygen at home.   This is multifactorial.    Right upper lobe pulmonary embolism, currently on heparin drip. Lower extremity Dopplers negative for DVT.  Start Eliquis today.    Right-sided heart failure: Treated with aggressive diuresis and now on diuretics holiday.  On Lopressor.  Followed by cardiology.  Added Aldactone. Continue to mobilize.  Wean off oxygen to keep saturation more than 90%.  Chest physiotherapy.  Streptococcal pyogenes bacteremia: Continue with IV penicillin.  Echocardiogram with severe TR.  TEE WITH SEVERE TR, NOT SUSPECTED TO HAVE ENDOCARDITIS.  REPEAT CULTURES CLEARED.  ID FOLLOWING.  CHANGED TO ORAL AMOXICILLIN TODAY FOR 7 ADDITIONAL DAYS.  likely source of infection is left-sided mandibular teeth.  No drainable fluid collection.  Will need outpatient dental surgery follow up.  Uncontrolled type 2 diabetes, hypoglycemia: Patient is on long-acting insulin and prandial insulin.  Episode of morning hypoglycemia.  On reduced dose of insulin.  Stable  today.    Acute kidney injury on chronic kidney disease stage IIIa: Baseline creatinine about 1.7.  Peaked to 2.9.  Creatinine is trending down and further improves.  Recheck tomorrow.  Hypomagnesemia: Replaced again.  Recheck tomorrow morning.  Hypokalemia: Replaced.  Recheck tomorrow morning.  Bell's palsy: Completed p.o. prednisone.  OSA: Declines CPAP most of the time.     DVT prophylaxis: Place TED hose Start: 04/22/22 0909   Code Status: Full code Family Communication: None at the bedside Disposition Plan: Status is: Inpatient Remains inpatient appropriate because: On active treatment, antibiotics, inpatient procedures planned     Consultants:  Cardiology ID  Procedures:  None  Antimicrobials:  Penicillin-- Amoxicillin 3/15--   Subjective:  Came back from procedure.  On room air sitting in bedside chair.  Mobility with some tachypnea and tachycardia but maintained oxygen about 89 to 90%.  He lives alone.  Looks very frail and difficulty walking.  We discussed about improving mobility today and walked multiple times, recheck labs tomorrow morning and possible discharge. Will send medications to hospital pharmacy to ensure medication available on discharge tomorrow.  Objective: Vitals:   04/25/22 0905 04/25/22 0915 04/25/22 0930 04/25/22 1142  BP: 113/68 109/67 114/70 112/77  Pulse: 74 79 79   Resp: (!) 21 13 19    Temp: 98.2 F (36.8 C)  98.2 F (36.8 C) 98.1 F (36.7 C)  TempSrc:    Oral  SpO2: 95% 97% 93%   Weight:      Height:        Intake/Output Summary (Last 24 hours) at 04/25/2022 1324 Last data filed at 04/25/2022 0927 Gross per 24  hour  Intake 2383.59 ml  Output 2950 ml  Net -566.41 ml    Filed Weights   04/23/22 0003 04/25/22 0600 04/25/22 0818  Weight: (!) 137.3 kg (!) 137 kg (!) 137 kg    Examination:  General exam: Appears calm and comfortable.  Sitting in chair.  On room air.  Chronically sick looking and debilitated gentleman  not in any distress. Respiratory system: Difficult to auscultate.  No added sounds. Cardiovascular system: S1 & S2 heard, RRR.  Trace bilateral pedal edema. Obese extremities. Gastrointestinal system: Soft.  Nontender.  Pendulous.   Central nervous system: Alert and oriented. No focal neurological deficits.    Data Reviewed: I have personally reviewed following labs and imaging studies  CBC: Recent Labs  Lab 04/20/22 0416 04/21/22 0046 04/22/22 0058 04/23/22 0034 04/24/22 0048 04/25/22 0050  WBC 13.8* 13.4* 12.2* 11.7* 7.5 7.6  NEUTROABS 10.0*  --   --   --   --   --   HGB 13.7 13.8 13.8 13.7 15.6 13.3  HCT 39.1 39.2 41.6 39.2 46.3 39.8  MCV 99.2 98.5 100.7* 99.5 101.1* 100.5*  PLT 145* 165 188 184 175 [id number]    Basic Metabolic Panel: Recent Labs  Lab 04/20/22 0416 04/22/22 0058 04/23/22 0034 04/24/22 0048 04/25/22 0050  NA 130* 136 134* 134* 132*  K 3.9 3.5 3.5 3.7 3.2*  CL 98 99 99 98 101  CO2 22 28 27 26 23   GLUCOSE 223* 51* 102* 102* 141*  BUN 37* 19 11 9  7*  CREATININE 1.74* 1.51* 1.25* 1.17 1.18  CALCIUM 8.7* 8.7* 8.6* 8.5* 8.5*  MG 2.1 1.7 1.5* 1.8 1.7  PHOS 2.5  --   --   --   --     GFR: Estimated Creatinine Clearance: 84.7 mL/min (by C-G formula based on SCr of 1.18 mg/dL). Liver Function Tests: Recent Labs  Lab 04/20/22 0416 04/22/22 0058 04/23/22 0034 04/24/22 0048 04/25/22 0050  AST 63* 34 23 22 18   ALT 49* 33 25 21 20   ALKPHOS 104 141* 145* 145* 144*  BILITOT 1.9* 1.3* 1.3* 1.8* 1.6*  PROT 7.4 7.5 7.5 8.0 8.2*  ALBUMIN 2.4* 2.3* 2.2* 2.2* 2.2*    No results for input(s): "LIPASE", "AMYLASE" in the last 168 hours.  No results for input(s): "AMMONIA" in the last 168 hours. Coagulation Profile: No results for input(s): "INR", "PROTIME" in the last 168 hours. Cardiac Enzymes: No results for input(s): "CKTOTAL", "CKMB", "CKMBINDEX", "TROPONINI" in the last 168 hours. BNP (last 3 results) No results for input(s): "PROBNP" in the last 8760  hours. HbA1C: No results for input(s): "HGBA1C" in the last 72 hours. CBG: Recent Labs  Lab 04/24/22 1628 04/24/22 2110 04/25/22 0611 04/25/22 0907 04/25/22 1141  GLUCAP 125* 140* 113* 96 82    Lipid Profile: No results for input(s): "CHOL", "HDL", "LDLCALC", "TRIG", "CHOLHDL", "LDLDIRECT" in the last 72 hours. Thyroid Function Tests: No results for input(s): "TSH", "T4TOTAL", "FREET4", "T3FREE", "THYROIDAB" in the last 72 hours. Anemia Panel: No results for input(s): "VITAMINB12", "FOLATE", "FERRITIN", "TIBC", "IRON", "RETICCTPCT" in the last 72 hours. Sepsis Labs: No results for input(s): "PROCALCITON", "LATICACIDVEN" in the last 168 hours.   Recent Results (from the past 240 hour(s))  Resp panel by RT-PCR (RSV, Flu A&B, Covid) Urine, Clean Catch     Status: None   Collection Time: 04/17/22  6:14 AM   Specimen: Urine, Clean Catch; Nasal Swab  Result Value Ref Range Status   SARS Coronavirus 2 by RT PCR  NEGATIVE NEGATIVE Final   Influenza A by PCR NEGATIVE NEGATIVE Final   Influenza B by PCR NEGATIVE NEGATIVE Final    Comment: (NOTE) The Xpert Xpress SARS-CoV-2/FLU/RSV plus assay is intended as an aid in the diagnosis of influenza from Nasopharyngeal swab specimens and should not be used as a sole basis for treatment. Nasal washings and aspirates are unacceptable for Xpert Xpress SARS-CoV-2/FLU/RSV testing.  Fact Sheet for Patients: EntrepreneurPulse.com.au  Fact Sheet for Healthcare Providers: IncredibleEmployment.be  This test is not yet approved or cleared by the [place] FDA and has been authorized for detection and/or diagnosis of SARS-CoV-2 by FDA under an Emergency Use Authorization (EUA). This EUA will remain in effect (meaning this test can be used) for the duration of the COVID-19 declaration under Section 564(b)(1) of the Act, 21 U.S.C. section 360bbb-3(b)(1), unless the authorization is terminated or revoked.      Resp Syncytial Virus by PCR NEGATIVE NEGATIVE Final    Comment: (NOTE) Fact Sheet for Patients: EntrepreneurPulse.com.au  Fact Sheet for Healthcare Providers: IncredibleEmployment.be  This test is not yet approved or cleared by the [place] FDA and has been authorized for detection and/or diagnosis of SARS-CoV-2 by FDA under an Emergency Use Authorization (EUA). This EUA will remain in effect (meaning this test can be used) for the duration of the COVID-19 declaration under Section 564(b)(1) of the Act, 21 U.S.C. section 360bbb-3(b)(1), unless the authorization is terminated or revoked.  Performed at [facility], [place] [address], [place], [place] [address]   Respiratory (~20 pathogens) panel by PCR     Status: None   Collection Time: 04/17/22  7:19 AM   Specimen: Nasopharyngeal Swab; Respiratory  Result Value Ref Range Status   Adenovirus NOT DETECTED NOT DETECTED Final   Coronavirus 229E NOT DETECTED NOT DETECTED Final    Comment: (NOTE) The Coronavirus on the Respiratory Panel, DOES NOT test for the novel  Coronavirus (2019 nCoV)    Coronavirus HKU1 NOT DETECTED NOT DETECTED Final   Coronavirus NL63 NOT DETECTED NOT DETECTED Final   Coronavirus OC43 NOT DETECTED NOT DETECTED Final   Metapneumovirus NOT DETECTED NOT DETECTED Final   Rhinovirus / Enterovirus NOT DETECTED NOT DETECTED Final   Influenza A NOT DETECTED NOT DETECTED Final   Influenza B NOT DETECTED NOT DETECTED Final   Parainfluenza Virus 1 NOT DETECTED NOT DETECTED Final   Parainfluenza Virus 2 NOT DETECTED NOT DETECTED Final   Parainfluenza Virus 3 NOT DETECTED NOT DETECTED Final   Parainfluenza Virus 4 NOT DETECTED NOT DETECTED Final   Respiratory Syncytial Virus NOT DETECTED NOT DETECTED Final   Bordetella pertussis NOT DETECTED NOT DETECTED Final   Bordetella Parapertussis NOT DETECTED NOT DETECTED Final   Chlamydophila pneumoniae NOT DETECTED NOT DETECTED  Final   Mycoplasma pneumoniae NOT DETECTED NOT DETECTED Final    Comment: Performed at [facility], [place]. [address] [place], [place] [address]  Culture, blood (Routine X 2) w Reflex to ID Panel     Status: Abnormal   Collection Time: 04/17/22  8:23 AM   Specimen: BLOOD  Result Value Ref Range Status   Specimen Description BLOOD RIGHT ANTECUBITAL  Final   Special Requests   Final    BOTTLES DRAWN AEROBIC AND ANAEROBIC Blood Culture results may not be optimal due to an inadequate volume of blood received in culture bottles   Culture  Setup Time   Final    GRAM POSITIVE COCCI IN CHAINS IN BOTH AEROBIC AND ANAEROBIC  BOTTLES CRITICAL RESULT CALLED TO, READ BACK BY AND VERIFIED WITH: PHARMD LISA CURRAN ON 04/17/22 @ 2151 BY DRT    Culture (A)  Final    STREPTOCOCCUS PYOGENES HEALTH DEPARTMENT NOTIFIED Performed at [facility], [place] [address] [place], [place] [address]    Report Status 04/19/2022 FINAL  Final   Organism ID, Bacteria STREPTOCOCCUS PYOGENES  Final      Susceptibility   Streptococcus pyogenes - MIC*    PENICILLIN <=0.06 SENSITIVE Sensitive     CEFTRIAXONE <=0.12 SENSITIVE Sensitive     ERYTHROMYCIN <=0.12 SENSITIVE Sensitive     LEVOFLOXACIN 0.5 SENSITIVE Sensitive     VANCOMYCIN 0.5 SENSITIVE Sensitive     * STREPTOCOCCUS PYOGENES  Blood Culture ID Panel (Reflexed)     Status: Abnormal   Collection Time: 04/17/22  8:23 AM  Result Value Ref Range Status   Enterococcus faecalis NOT DETECTED NOT DETECTED Final   Enterococcus Faecium NOT DETECTED NOT DETECTED Final   Listeria monocytogenes NOT DETECTED NOT DETECTED Final   Staphylococcus species NOT DETECTED NOT DETECTED Final   Staphylococcus aureus (BCID) NOT DETECTED NOT DETECTED Final   Staphylococcus epidermidis NOT DETECTED NOT DETECTED Final   Staphylococcus lugdunensis NOT DETECTED NOT DETECTED Final   Streptococcus species DETECTED (A) NOT DETECTED Final    Comment: CRITICAL RESULT CALLED TO, READ  BACK BY AND VERIFIED WITH: PHARMD LISA CURRAN ON 04/17/22 @ 2151 BY DRT    Streptococcus agalactiae NOT DETECTED NOT DETECTED Final   Streptococcus pneumoniae NOT DETECTED NOT DETECTED Final   Streptococcus pyogenes DETECTED (A) NOT DETECTED Final    Comment: CRITICAL RESULT CALLED TO, READ BACK BY AND VERIFIED WITH: PHARMD LISA CURRAN ON 04/17/22 @ 2151 BY DRT    A.calcoaceticus-baumannii NOT DETECTED NOT DETECTED Final   Bacteroides fragilis NOT DETECTED NOT DETECTED Final   Enterobacterales NOT DETECTED NOT DETECTED Final   Enterobacter cloacae complex NOT DETECTED NOT DETECTED Final   Escherichia coli NOT DETECTED NOT DETECTED Final   Klebsiella aerogenes NOT DETECTED NOT DETECTED Final   Klebsiella oxytoca NOT DETECTED NOT DETECTED Final   Klebsiella pneumoniae NOT DETECTED NOT DETECTED Final   Proteus species NOT DETECTED NOT DETECTED Final   Salmonella species NOT DETECTED NOT DETECTED Final   Serratia marcescens NOT DETECTED NOT DETECTED Final   Haemophilus influenzae NOT DETECTED NOT DETECTED Final   Neisseria meningitidis NOT DETECTED NOT DETECTED Final   Pseudomonas aeruginosa NOT DETECTED NOT DETECTED Final   Stenotrophomonas maltophilia NOT DETECTED NOT DETECTED Final   Candida albicans NOT DETECTED NOT DETECTED Final   Candida auris NOT DETECTED NOT DETECTED Final   Candida glabrata NOT DETECTED NOT DETECTED Final   Candida krusei NOT DETECTED NOT DETECTED Final   Candida parapsilosis NOT DETECTED NOT DETECTED Final   Candida tropicalis NOT DETECTED NOT DETECTED Final   Cryptococcus neoformans/gattii NOT DETECTED NOT DETECTED Final    Comment: Performed at [facility], [place] [address] [place], [place] [address]  Culture, blood (Routine X 2) w Reflex to ID Panel     Status: Abnormal   Collection Time: 04/17/22  8:30 AM   Specimen: BLOOD LEFT FOREARM  Result Value Ref Range Status   Specimen Description BLOOD LEFT FOREARM  Final   Special Requests   Final     BOTTLES DRAWN AEROBIC AND ANAEROBIC Blood Culture adequate volume   Culture  Setup Time   Final    GRAM POSITIVE COCCI IN CHAINS IN BOTH AEROBIC AND  ANAEROBIC BOTTLES CRITICAL VALUE NOTED.  VALUE IS CONSISTENT WITH PREVIOUSLY REPORTED AND CALLED VALUE.    Culture (A)  Final    STREPTOCOCCUS PYOGENES SUSCEPTIBILITIES PERFORMED ON PREVIOUS CULTURE WITHIN THE LAST 5 DAYS. Performed at [facility], [place] [address], [place], [place] [address]    Report Status 04/19/2022 FINAL  Final  Culture, blood (Routine X 2) w Reflex to ID Panel     Status: None   Collection Time: 04/19/22  1:23 AM   Specimen: BLOOD LEFT HAND  Result Value Ref Range Status   Specimen Description BLOOD LEFT HAND  Final   Special Requests   Final    BOTTLES DRAWN AEROBIC ONLY Blood Culture results may not be optimal due to an inadequate volume of blood received in culture bottles   Culture   Final    NO GROWTH 5 DAYS Performed at [facility], [place] [address], [place], [place] [address]    Report Status 04/24/2022 FINAL  Final  Culture, blood (Routine X 2) w Reflex to ID Panel     Status: None   Collection Time: 04/19/22  1:25 AM   Specimen: BLOOD RIGHT HAND  Result Value Ref Range Status   Specimen Description BLOOD RIGHT HAND  Final   Special Requests   Final    BOTTLES DRAWN AEROBIC AND ANAEROBIC Blood Culture adequate volume   Culture   Final    NO GROWTH 5 DAYS Performed at [facility], [place] [address], [place], [place] [address]    Report Status 04/24/2022 FINAL  Final  Urine Culture (for pregnant, neutropenic or urologic patients or patients with an indwelling urinary catheter)     Status: None   Collection Time: 04/21/22  3:13 AM   Specimen: Urine, Clean Catch  Result Value Ref Range Status   Specimen Description URINE, CLEAN CATCH  Final   Special Requests NONE  Final   Culture   Final    NO GROWTH Performed at [facility], [place] [address], [place], [place] [address]     Report Status 04/22/2022 FINAL  Final         Radiology Studies: No results found.      Scheduled Meds:  amoxicillin  1,000 mg Oral Q8H   artificial tears   Right Eye Q4H   aspirin EC  81 mg Oral Daily   famotidine  20 mg Oral Daily   insulin aspart  0-20 Units Subcutaneous TID WC   insulin aspart  0-5 Units Subcutaneous QHS   insulin aspart  4 Units Subcutaneous TID WC   insulin glargine-yfgn  20 Units Subcutaneous Daily   magnesium oxide  400 mg Oral BID   metoprolol tartrate  12.5 mg Oral BID   pantoprazole  40 mg Oral Daily   polyethylene glycol  17 g Oral Daily   rosuvastatin  20 mg Oral QPM   sodium chloride flush  3 mL Intravenous Q12H   spironolactone  12.5 mg Oral Daily   Continuous Infusions:     LOS: 7 days    Time spent: 35 minutes    Barb Merino, MD [facility] Pager [phone]

## 2022-04-25 NOTE — Progress Notes (Signed)
Rounding Note    Patient Name: [name] [name] Date of Encounter: 04/25/2022  [facility] Cardiologist: Candee Furbish, MD   Subjective   Urinated 2600 ml, but 2500 in so about net even. Not on diuretic. Still volume overloaded. RV is large and TEE today suggests moderate RV dysfunction. Renal function has normalized.  TEE today did not show vegetation. There is severe TR. He is off oxygen.  Inpatient Medications    Scheduled Meds:  amoxicillin  1,000 mg Oral Q8H   artificial tears   Right Eye Q4H   aspirin EC  81 mg Oral Daily   famotidine  20 mg Oral Daily   insulin aspart  0-20 Units Subcutaneous TID WC   insulin aspart  0-5 Units Subcutaneous QHS   insulin aspart  4 Units Subcutaneous TID WC   insulin glargine-yfgn  20 Units Subcutaneous Daily   metoprolol tartrate  12.5 mg Oral BID   pantoprazole  40 mg Oral Daily   polyethylene glycol  17 g Oral Daily   rosuvastatin  20 mg Oral QPM   sodium chloride flush  3 mL Intravenous Q12H   Continuous Infusions:  heparin 2,050 Units/hr (04/25/22 0837)   PRN Meds: acetaminophen **OR** acetaminophen, diclofenac Sodium, guaiFENesin, hydrALAZINE, [COMPLETED] ipratropium-albuterol **FOLLOWED BY** ipratropium-albuterol, metoprolol tartrate, ondansetron **OR** ondansetron (ZOFRAN) IV, pregabalin, senna-docusate, traZODone   Vital Signs    Vitals:   04/25/22 0905 04/25/22 0915 04/25/22 0930 04/25/22 1142  BP: 113/68 109/67 114/70 112/77  Pulse: 74 79 79   Resp: (!) 21 13 19    Temp: 98.2 F (36.8 C)  98.2 F (36.8 C) 98.1 F (36.7 C)  TempSrc:    Oral  SpO2: 95% 97% 93%   Weight:      Height:        Intake/Output Summary (Last 24 hours) at 04/25/2022 1207 Last data filed at 04/25/2022 [id number] Gross per 24 hour  Intake 2383.59 ml  Output 2950 ml  Net -566.41 ml      04/25/2022    8:18 AM 04/25/2022    6:00 AM 04/23/2022   12:03 AM  Last 3 Weights  Weight (lbs) 302 lb 0.5 oz 302 lb 0.5 oz 302 lb 11.1 oz  Weight  (kg) 137 kg 137 kg 137.3 kg      Telemetry    NSR rate of 80- Personally Reviewed  ECG    None new - Personally Reviewed  Physical Exam   GEN: No acute distress.   Neck: No JVD Cardiac: RRR, no murmurs, rubs, or gallops.  Respiratory: decreased breath sounds at the bases GI: firm distended abdomen MS: trace edema Neuro:  Nonfocal  Psych: Normal affect   Labs    High Sensitivity Troponin:   Recent Labs  Lab 04/17/22 0045 04/17/22 0240  TROPONINIHS 11 11     Chemistry Recent Labs  Lab 04/23/22 0034 04/24/22 0048 04/25/22 0050  NA 134* 134* 132*  K 3.5 3.7 3.2*  CL 99 98 101  CO2 27 26 23   GLUCOSE 102* 102* 141*  BUN 11 9 7*  CREATININE 1.25* 1.17 1.18  CALCIUM 8.6* 8.5* 8.5*  MG 1.5* 1.8 1.7  PROT 7.5 8.0 8.2*  ALBUMIN 2.2* 2.2* 2.2*  AST 23 22 18   ALT 25 21 20   ALKPHOS 145* 145* 144*  BILITOT 1.3* 1.8* 1.6*  GFRNONAA >60 >60 >60  ANIONGAP 8 10 8     Lipids No results for input(s): "CHOL", "TRIG", "HDL", "LABVLDL", "LDLCALC", "CHOLHDL" in the last  168 hours.  Hematology Recent Labs  Lab 04/23/22 0034 04/24/22 0048 04/25/22 0050  WBC 11.7* 7.5 7.6  RBC 3.94* 4.58 3.96*  HGB 13.7 15.6 13.3  HCT 39.2 46.3 39.8  MCV 99.5 101.1* 100.5*  MCH 34.8* 34.1* 33.6  MCHC 34.9 33.7 33.4  RDW 15.1 15.6* 15.3  PLT 184 175 247   Thyroid No results for input(s): "TSH", "FREET4" in the last 168 hours.  BNP No results for input(s): "BNP", "PROBNP" in the last 168 hours.   DDimer No results for input(s): "DDIMER" in the last 168 hours.   Radiology    No results found.  Cardiac Studies   Echo 04/17/22: 1. Left ventricular ejection fraction, by estimation, is 55 to 60%. The  left ventricle has normal function. The left ventricle has no regional  wall motion abnormalities. Left ventricular diastolic parameters were  normal. There is the interventricular  septum is flattened in diastole ('D' shaped left ventricle), consistent  with right ventricular volume  overload.   2. Right ventricular systolic function is severely reduced. The right  ventricular size is moderately enlarged. There is normal pulmonary artery  systolic pressure. The estimated right ventricular systolic pressure is  [id number] mmHg, but this may be severely  underestimated in the setting of torrential tricuspid regurgitation.   3. Left atrial size was mildly dilated.   4. Right atrial size was severely dilated.   5. The mitral valve is normal in structure. Mild mitral valve  regurgitation.   6. Tricuspid valve regurgitation is torrential, due to severe leaflet  malcoaptation.   7. The aortic valve is tricuspid. There is mild calcification of the  aortic valve. Aortic valve regurgitation is not visualized. Aortic valve  sclerosis/calcification is present, without any evidence of aortic  stenosis.   Patient Profile     67 y.o. male  hx of CAD s/p CABG [id number], chronic diastolic heart failure, hypertension, liver cirrhosis, hypokalemia, OSA, RBBB, recent COVID infection history of ectopic atrial rhythm, CKD III, hyperlipidemia, morbid obesity, DM2 , PVD, who was admitted 04/16/2022 for the evaluation of acute hypoxia with right sided heart failure at the request of Dr. Florene Glen.  Now with Streptococcus pyogenes bacteremia with unclear source and new onset of severe tricuspid regurgitation.  Assessment & Plan    New onset right-sided heart failure preserved EF with CKD stage III Severe tricuspid regurgitation  Started on treatment for heart failure and received Lasix then became hypotensive and had decline in renal function in the emergency department. BNP 1000, chest x-ray with congestion but no edema. Since then diuretics and eplerinone have been withheld with some improvement of kidney function, but no updated labs this morning. Previously was being managed on 40 mg of torsemide daily, eplerenone 25 mg daily, and potassium supplementation.  He is intolerant to SGLT2 inhibitors due to prior  yeast infection.  He has been followed by Dr. Marlou Porch and sees him regularly.  He is supposed to be on 2 L of oxygen at home but is noncompliant at home. Unable to maximize GDMT due to previous contraindications and declining renal function.   -Auto-diuresing - creatinine stable, but volume overloaded. Signs of RV dysfunction on echo today - continue metoprolol 12.5 mg BID - add aldactone 12.5 mg daily   Acute respiratory failure with hypoxia secondary to pulmonary embolism -Transition from heparin to DOAC today - neg for DVT 04/18/22 - off oxygen   Strep pyogenes bacteremia - TEE negative for vegetation today   CAD s/p CABG  x 4 in 2005 Aortic atherosclerosis  HLD with LDL goal <70 - continue 81 mg aspirin and rosuvastatin 20mg  - denies any chest pain on this admission   HTN See above in the context of CHF management.   OSA - Now has refused CPAP again due to not having the correct mask  Hypomagnesemia -chronic, replete - would also start magnesium oxide 400 mg BID tonight  Hypokalemia - replete potassium (3.2 today).     For questions or updates, please contact [place] Please consult [email or web address] for contact info under   Pixie Casino, MD, FACC, [place] Director of the [facility]  [facility] Diplomate of the American Board of Clinical Lipidology Attending Cardiologist  Direct Dial: [phone]  Fax: [phone]  Website:  [email or web address]  Pixie Casino, MD  04/25/2022, 12:07 PM

## 2022-04-25 NOTE — Progress Notes (Signed)
Mobility Specialist Progress Note:   04/25/22 1113  Mobility  Activity Ambulated with assistance in hallway  Level of Assistance Standby assist, set-up cues, supervision of patient - no hands on  Assistive Device Four wheel walker  Distance Ambulated (ft) 100 ft  Activity Response Tolerated well  $Mobility charge 1 Mobility   Pt in chair willing to participate in mobility. No complaints of pain. Left in chair with call bell in reach and all needs met.   [facility] Kayah Hecker Mobility Specialist Please contact via [facility] [employer] or  Rehab Office at [phone]

## 2022-04-26 ENCOUNTER — Encounter (HOSPITAL_COMMUNITY): Payer: Self-pay | Admitting: Cardiology

## 2022-04-26 DIAGNOSIS — I071 Rheumatic tricuspid insufficiency: Secondary | ICD-10-CM

## 2022-04-26 DIAGNOSIS — J9601 Acute respiratory failure with hypoxia: Secondary | ICD-10-CM | POA: Diagnosis not present

## 2022-04-26 LAB — BASIC METABOLIC PANEL
Anion gap: 8 (ref 5–15)
BUN: 7 mg/dL — ABNORMAL LOW (ref 8–23)
CO2: 21 mmol/L — ABNORMAL LOW (ref 22–32)
Calcium: 8.5 mg/dL — ABNORMAL LOW (ref 8.9–10.3)
Chloride: 103 mmol/L (ref 98–111)
Creatinine, Ser: 1.03 mg/dL (ref 0.61–1.24)
GFR, Estimated: >60 mL/min (ref >60)
Glucose, Bld: 108 mg/dL — ABNORMAL HIGH (ref 70–99)
Potassium: 3.4 mmol/L — ABNORMAL LOW (ref 3.5–5.1)
Sodium: 132 mmol/L — ABNORMAL LOW (ref 135–145)

## 2022-04-26 LAB — GLUCOSE, CAPILLARY
Glucose-Capillary: 89 mg/dL (ref 70–99)
Glucose-Capillary: 131 mg/dL — ABNORMAL HIGH (ref 70–99)

## 2022-04-26 LAB — CBC
HCT: 37.4 % — ABNORMAL LOW (ref 39.0–52.0)
Hemoglobin: 13.1 g/dL (ref 13.0–17.0)
MCH: 34.7 pg — ABNORMAL HIGH (ref 26.0–34.0)
MCHC: 35 g/dL (ref 30.0–36.0)
MCV: 99.2 fL (ref 80.0–100.0)
Platelets: 239 10*3/uL (ref 150–400)
RBC: 3.77 MIL/uL — ABNORMAL LOW (ref 4.22–5.81)
RDW: 15.5 % (ref 11.5–15.5)
WBC: 6 10*3/uL (ref 4.0–10.5)
nRBC: 0 % (ref 0.0–0.2)

## 2022-04-26 LAB — MAGNESIUM: Magnesium: 1.8 mg/dL (ref 1.7–2.4)

## 2022-04-26 MED ORDER — POTASSIUM CHLORIDE 20 MEQ PO PACK
40.0000 meq | PACK | Freq: Once | ORAL | Status: AC
  Administered 2022-04-26: 40 meq via ORAL
  Filled 2022-04-26: qty 2

## 2022-04-26 MED ORDER — MAGNESIUM SULFATE 2 GM/50ML IV SOLN
2.0000 g | Freq: Once | INTRAVENOUS | Status: AC
  Administered 2022-04-26: 2 g via INTRAVENOUS
  Filled 2022-04-26: qty 50

## 2022-04-26 NOTE — Discharge Summary (Signed)
Physician Discharge Summary  [name] [name] [id number] DOB: [birth year] DOA: 04/16/2022  PCP: [name], MD  Admit date: 04/16/2022 Discharge date: 04/26/2022  Admitted From: Home Disposition: Home with home health  Recommendations for Outpatient Follow-up:  Follow up with PCP in 1-2 weeks Please obtain BMP/CBC in one week Cardiology clinic will schedule follow-up  Home Health: Physical therapy Equipment/Devices: Bedside commode, rolling walker  Discharge Condition: Fair and stable CODE STATUS: Full code Diet recommendation: Low-salt and low-carb diet  Discharge summary: 67 year old male with coronary artery disease status post CABG in [id number], diastolic heart failure, cirrhosis of liver, ectopic atrial rhythm, hypertension, CKD 3A, diabetes mellitus type 2, sleep apnea, morbid obesity, peripheral neuropathy and varicose veins who presented to the hospital for back pain and found to be hypoxemic.  He was started on treatment for heart failure.  After receiving Lasix Cozaar and Lopressor, he became hypotensive in the ED. VQ scan on 3/8 showed pulmonary embolism, cultures ended up growing streptococcal pyogenes.  ID and cardiology was consulted.  Prolonged hospitalization for treatment of bacteremia, fluid overload.  Currently improved.  Going home today.     Assessment & Plan:   Acute respiratory failure with hypoxemia:  Resolved.  On room air now.  Today on room air and maintaining normal saturation on mobility.  Not on oxygen at home. This is multifactorial.     Right upper lobe pulmonary embolism, treated with perioperative heparin drip.  Lower extremity Dopplers negative for DVT.  Changed to Eliquis.  Discharging on Eliquis.   Right-sided heart failure: Treated with aggressive diuresis and now clinically euvolemic.  Echocardiogram is still with severe right-sided heart failure but patient has much improved. Followed by cardiology.   Patient is going home on Aldactone and  beta-blockers.  He is not tolerating further heart failure regimen, however will have follow-up in cardiology clinic.   Streptococcal pyogenes bacteremia: Treated with IV penicillin.  Echocardiogram with severe TR.  TEE WITH SEVERE TR, NOT SUSPECTED TO HAVE ENDOCARDITIS.  REPEAT CULTURES CLEARED.  ID FOLLOWING.  CHANGED TO ORAL AMOXICILLIN TODAY FOR 7 ADDITIONAL DAYS.  likely source of infection is left-sided mandibular teeth.  No drainable fluid collection.  Will need outpatient dental surgery follow up.   Uncontrolled type 2 diabetes, hypoglycemia: Patient is on long-acting insulin and prandial insulin.  Episode of morning hypoglycemia.  His appetite is now improving.  He is going home on reduced dose of long-acting insulin and carb count sliding scale.   Acute kidney injury on chronic kidney disease stage IIIa: Baseline creatinine about 1.7.  Peaked to 2.9.  Creatinine is trending down and further improved and normalized.   Hypomagnesemia: Aggressively replaced.  Normalized.   Hypokalemia: Replaced.  Further replaced today.  Will not discharge home with potassium supplementation because he is on Aldactone and not going to start torsemide.   Bell's palsy: Completed p.o. prednisone.   OSA: Declines CPAP most of the time.  He needs tubing for his CPAP at home and he is going to explore that with his providers.   Clinically stable for discharge today.   Discharge Diagnoses:  Principal Problem:   Acute respiratory failure with hypoxia (HCC) Active Problems:   Bacteremia   Severe tricuspid regurgitation   Pulmonary embolism (HCC)   RVF (right ventricular failure) ([place])   AKI (acute kidney injury) [facility])    Discharge Instructions  Discharge Instructions     Diet - low sodium heart healthy   Complete by: As directed  Diet Carb Modified   Complete by: As directed    Increase activity slowly   Complete by: As directed       Allergies as of 04/26/2022       Reactions    Empagliflozin Rash   Patient reports yeast.    Ibuprofen Other (See Comments)   Stomach pain        Medication List     STOP taking these medications    accu-chek multiclix lancets   eplerenone 25 MG tablet Commonly known as: INSPRA   Hibiclens 4 % external liquid Generic drug: chlorhexidine   HumuLIN R U-500 KwikPen 500 UNIT/ML KwikPen Generic drug: insulin regular human CONCENTRATED   ipratropium-albuterol 0.5-2.5 (3) MG/3ML Soln Commonly known as: DUONEB   losartan 50 MG tablet Commonly known as: COZAAR   meloxicam 15 MG tablet Commonly known as: MOBIC   nystatin ointment Commonly known as: MYCOSTATIN   ofloxacin 0.3 % ophthalmic solution Commonly known as: OCUFLOX   potassium chloride SA 20 MEQ tablet Commonly known as: KLOR-CON M   predniSONE 10 MG tablet Commonly known as: DELTASONE   pregabalin 50 MG capsule Commonly known as: Lyrica   Santyl 250 UNIT/GM ointment Generic drug: collagenase   torsemide 20 MG tablet Commonly known as: DEMADEX       TAKE these medications    amoxicillin 500 MG capsule Commonly known as: AMOXIL Take 2 capsules (1,000 mg total) by mouth every 8 (eight) hours for 7 days.   aspirin EC 81 MG tablet Take 81 mg by mouth daily.   B-D ULTRAFINE III SHORT PEN 31G X 8 MM Misc Generic drug: Insulin Pen Needle   diclofenac Sodium 1 % Gel Commonly known as: VOLTAREN APPLY 2 GRAMS TOPICALLY 4 TIMES DAILY TO KNEE   Eliquis DVT/PE Starter Pack Generic drug: Apixaban Starter Pack (10mg  and 5mg ) Take as directed on package: start with two-5mg  tablets twice daily for 7 days. On day 8, switch to one-5mg  tablet twice daily.   famotidine 20 MG tablet Commonly known as: PEPCID TAKE 1 TABLET(20 MG) BY MOUTH DAILY What changed: See the new instructions.   fluticasone 50 MCG/ACT nasal spray Commonly known as: FLONASE Place 1 spray into both nostrils daily.   HumaLOG 100 UNIT/ML injection Generic drug: insulin  lispro Inject 40 Units into the skin See admin instructions. Per sliding scale patient injects 40 units if blood sugar is 400. What changed: Another medication with the same name was removed. Continue taking this medication, and follow the directions you see here.   hydroxypropyl methylcellulose / hypromellose 2.5 % ophthalmic solution Commonly known as: ISOPTO TEARS / GONIOVISC Place 2 drops into both eyes as needed for dry eyes.   Lantus SoloStar 100 UNIT/ML Solostar Pen Generic drug: insulin glargine Inject 20 Units into the skin every morning. What changed: how much to take   lidocaine 5 % Commonly known as: Lidoderm Place 1 patch onto the skin daily. Remove & Discard patch within 12 hours or as directed by MD What changed: additional instructions   MAGnesium-Oxide 400 (241.3 Mg) MG tablet Generic drug: magnesium oxide Take 1 tablet by mouth daily.   metoprolol tartrate 25 MG tablet Commonly known as: LOPRESSOR Take 0.5 tablets (12.5 mg total) by mouth 2 (two) times daily. What changed:  medication strength See the new instructions.   nitroGLYCERIN 0.4 MG SL tablet Commonly known as: NITROSTAT Place 1 tablet (0.4 mg total) under the tongue every 5 (five) minutes as needed for chest pain.  OneTouch Ultra test strip Generic drug: glucose blood TEST AS DIRECTED 8 TIMES DAILY   polyethylene glycol 17 g packet Commonly known as: MIRALAX / GLYCOLAX Take 17 g by mouth daily.   ProAir HFA 108 (90 Base) MCG/ACT inhaler Generic drug: albuterol Inhale 2 puffs into the lungs every 6 (six) hours as needed for wheezing or shortness of breath.   rosuvastatin 20 MG tablet Commonly known as: CRESTOR Take 1 tablet (20 mg total) by mouth every evening.   silver sulfADIAZINE 1 % cream Commonly known as: SSD APPLY PEA SIZED AMOUNT TOPICALLY TO WOUND DAILY   spironolactone 25 MG tablet Commonly known as: ALDACTONE Take 0.5 tablets (12.5 mg total) by mouth daily.   Trelegy  Ellipta 100-62.5-25 MCG/ACT Aepb Generic drug: Fluticasone-Umeclidin-Vilant Inhale 1 puff into the lungs daily.               Durable Medical Equipment  (From admission, onward)           Start     Ordered   04/23/22 1452  For home use only DME Bedside commode  Once       Comments: Bariatric  Question:  Patient needs a bedside commode to treat with the following condition  Answer:  Weakness   04/23/22 1451   04/23/22 1451  For home use only DME 4 wheeled rolling walker with seat  Once       Comments: Bariatric  Question:  Patient needs a walker to treat with the following condition  Answer:  Weakness   04/23/22 1450            Follow-up Information     [facility] [place] Follow up.   Specialty: Home Health Services Why: Agency will call you to set up apt times Contact information: [place] [address] [phone]         [facility] Follow up.   Why: Bariatric Roll[facility], [facility] Contact information: [id number]               Allergies  Allergen Reactions   Empagliflozin Rash    Patient reports yeast.    Ibuprofen Other (See Comments)    Stomach pain    Consultations: Cardiology Infectious disease   Procedures/Studies: ECHO TEE  Result Date: 04/25/2022    TRANSESOPHOGEAL ECHO REPORT   Patient Name:   [name] [name] Date of Exam: 04/25/2022 Medical Rec #:  [id number]       Height:       71.0 in Accession #:    [id number]      Weight:       302.0 lb Date of Birth:  1956/02/03       BSA:          2.512 m Patient Age:    63 years        BP:           125/73 mmHg Patient Gender: M               HR:           95 bpm. Exam Location:  Inpatient Procedure: Transesophageal Echo, Color Doppler and Cardiac Doppler Indications:     Bacteremia  History:         Patient has prior history of Echocardiogram examinations, most                  recent 04/17/2022. CAD, Prior CABG, PVD; Risk  Factors:Hypertension, Diabetes,  Dyslipidemia and Former Smoker.  Sonographer:     Wilkie Aye RVT Referring Phys:  [id number] Lily Kocher Diagnosing Phys: Kirk Ruths MD PROCEDURE: After discussion of the risks and benefits of a TEE, an informed consent was obtained from the patient. The transesophogeal probe was passed without difficulty through the esophogus of the patient. Local oropharyngeal anesthetic was provided with Cetacaine. Sedation performed by different physician. The patient was monitored while under deep sedation. Anesthestetic sedation was provided intravenously by Anesthesiology: 80mg  of Propofol. The patient developed no complications during the procedure.  IMPRESSIONS  1. Small oscillating density on aortic valve (possible vegetation vs Lambl's excrescence); TV leaflets do not coapt with wide open TR.  2. Left ventricular ejection fraction, by estimation, is 55 to 60%. The left ventricle has normal function. The left ventricle has no regional wall motion abnormalities.  3. Right ventricular systolic function is moderately reduced. The right ventricular size is severely enlarged.  4. Left atrial size was mildly dilated. No left atrial/left atrial appendage thrombus was detected.  5. Right atrial size was severely dilated.  6. The mitral valve is normal in structure. Mild mitral valve regurgitation.  7. The tricuspid valve is abnormal. Tricuspid valve regurgitation is severe.  8. The aortic valve is tricuspid. Aortic valve regurgitation is trivial.  9. Aortic dilatation noted. There is mild dilatation of the aortic root, measuring 39 mm. There is mild (Grade II) plaque involving the descending aorta. FINDINGS  Left Ventricle: Left ventricular ejection fraction, by estimation, is 55 to 60%. The left ventricle has normal function. The left ventricle has no regional wall motion abnormalities. The left ventricular internal cavity size was normal in size. Right Ventricle: The right ventricular size is severely enlarged. Right  ventricular systolic function is moderately reduced. Left Atrium: Left atrial size was mildly dilated. No left atrial/left atrial appendage thrombus was detected. Right Atrium: Right atrial size was severely dilated. Pericardium: There is no evidence of pericardial effusion. Mitral Valve: The mitral valve is normal in structure. Mild mitral valve regurgitation. Tricuspid Valve: The tricuspid valve is abnormal. Tricuspid valve regurgitation is severe. Aortic Valve: The aortic valve is tricuspid. Aortic valve regurgitation is trivial. Pulmonic Valve: The pulmonic valve was normal in structure. Pulmonic valve regurgitation is trivial. Aorta: Aortic dilatation noted. There is mild dilatation of the aortic root, measuring 39 mm. There is mild (Grade II) plaque involving the descending aorta. IAS/Shunts: There is left bowing of the interatrial septum, suggestive of elevated right atrial pressure. No atrial level shunt detected by color flow Doppler. Additional Comments: Small oscillating density on aortic valve (possible vegetation vs Lambl's excrescence); TV leaflets do not coapt with wide open TR.  AORTA Ao Root diam: 3.10 cm Kirk Ruths MD Electronically signed by Kirk Ruths MD Signature Date/Time: 04/25/2022/1:38:50 PM    Final    VAS [place] UPPER EXTREMITY VENOUS DUPLEX  Result Date: 04/22/2022 UPPER VENOUS STUDY  Patient Name:  [name] [name]  Date of Exam:   04/22/2022 Medical Rec #: [id number]        Accession #:    [id number] Date of Birth: 15-Mar-1955        Patient Gender: M Patient Age:   10 years Exam Location:  [facility] Procedure:      VAS [place] UPPER EXTREMITY VENOUS DUPLEX Referring Phys: TRUNG VU --------------------------------------------------------------------------------  Indications: Swelling Anticoagulation: Heparin. Comparison Study: No prior study. Performing Technologist: McKayla Maag RVT, VT  Examination Guidelines: A complete evaluation includes B-mode imaging,  spectral Doppler,  color Doppler, and power Doppler as needed of all accessible portions of each vessel. Bilateral testing is considered an integral part of a complete examination. Limited examinations for reoccurring indications may be performed as noted.  Right Findings: +----------+------------+---------+-----------+----------+-------+ RIGHT     CompressiblePhasicitySpontaneousPropertiesSummary +----------+------------+---------+-----------+----------+-------+ IJV           Full       Yes       Yes                      +----------+------------+---------+-----------+----------+-------+ Subclavian    Full       Yes       Yes                      +----------+------------+---------+-----------+----------+-------+ Axillary      Full       Yes       Yes                      +----------+------------+---------+-----------+----------+-------+ Brachial      Full       Yes       Yes                      +----------+------------+---------+-----------+----------+-------+ Radial        Full                                          +----------+------------+---------+-----------+----------+-------+ Ulnar         Full                                          +----------+------------+---------+-----------+----------+-------+ Cephalic      None       No        No                Acute  +----------+------------+---------+-----------+----------+-------+ Basilic       Full                                          +----------+------------+---------+-----------+----------+-------+ SVT in cephalic vein mid upper arm to wrist. SVT in branch off basilic vein at [facility] Fossa.  Left Findings: +----------+------------+---------+-----------+----------+-------+ LEFT      CompressiblePhasicitySpontaneousPropertiesSummary +----------+------------+---------+-----------+----------+-------+ Subclavian    Full       Yes       Yes                       +----------+------------+---------+-----------+----------+-------+  Summary:  Right: Findings consistent with acute superficial vein thrombosis involving the right cephalic vein. As well as acute SVT in branch off basilic vein at [facility] fossa.  Left: No evidence of thrombosis in the subclavian.  *See table(s) above for measurements and observations.  Diagnosing physician: Servando Snare MD Electronically signed by Servando Snare MD on 04/22/2022 at 4:52:29 PM.    Final    CT ABDOMEN PELVIS W CONTRAST  Result Date: 04/21/2022 CLINICAL DATA:  Bowel obstruction suspected EXAM: CT ABDOMEN AND PELVIS WITH CONTRAST TECHNIQUE: Multidetector CT imaging of the abdomen and pelvis was performed using the standard protocol following bolus administration of  intravenous contrast. RADIATION DOSE REDUCTION: This exam was performed according to the departmental dose-optimization program which includes automated exposure control, adjustment of the mA and/or kV according to patient size and/or use of iterative reconstruction technique. CONTRAST:  1mL OMNIPAQUE IOHEXOL 350 MG/ML SOLN COMPARISON:  CT abdomen pelvis 04/17/2022 FINDINGS: Lower chest: Enlarged right atrium.  Coronary calcification. Hepatobiliary: No focal liver abnormality. No gallstones, gallbladder wall thickening, or pericholecystic fluid. No biliary dilatation. Pancreas: No focal lesion. Normal pancreatic contour. No surrounding inflammatory changes. No main pancreatic ductal dilatation. Spleen: Normal in size without focal abnormality.  Splenule noted. Adrenals/Urinary Tract: No adrenal nodule bilaterally. Bilateral kidneys enhance symmetrically. No hydronephrosis. No hydroureter. The urinary bladder is unremarkable. On delayed imaging, there is no urothelial wall thickening and there are no filling defects in the opacified portions of the bilateral collecting systems or ureters. Stomach/Bowel: Stomach is within normal limits. No evidence of bowel wall thickening or  dilatation. Lack of haustral along the transverse colon which may be due to chronic inflammation or constipation. Stool within the ascending and descending colon. Appendix appears normal. Vascular/Lymphatic: No abdominal aorta or iliac aneurysm. Moderate atherosclerotic plaque of the aorta and its branches. Multiple prominent retroperitoneal lymph nodes more prominent along the left periaortic region. No abdominal, pelvic, or inguinal lymphadenopathy. Reproductive: Prostate is unremarkable. Other: Interval development of trace simple free fluid and fat stranding of the left retroperitoneum along the ureter. No intraperitoneal free gas. No organized fluid collection. Musculoskeletal: Small fat containing umbilical hernia.  Bilateral gynecomastia. No suspicious lytic or blastic osseous lesions. No acute displaced fracture. Multilevel degenerative changes of the spine. IMPRESSION: Interval development of trace simple free fluid and fat stranding of the left retroperitoneum along the ureter. Associated multiple prominent retroperitoneal lymph nodes. Correlate with urinalysis to exclude infection. Recommend follow-up CT in 3 months to evaluate for resolution of prominent lymph nodes. Electronically Signed   By: Iven Finn M.D.   On: 04/21/2022 03:07   DG Abd Portable 1V  Result Date: 04/20/2022 CLINICAL DATA:  Vomiting EXAM: PORTABLE ABDOMEN - 1 VIEW COMPARISON:  Abdominal x-ray 03/10/2003 FINDINGS: There is a dilated loop of air-filled colon in the central abdomen measuring up to 10 cm. Otherwise there is a paucity of colonic and small bowel gas. No suspicious calcification. Lung bases are clear. Sternotomy wires are present. IMPRESSION: Dilated loop of air-filled colon in the central abdomen measuring up to 10 cm. This is nonspecific and could be due to ileus or obstruction. Please correlate clinically. Consider CT. Electronically Signed   By: Ronney Asters M.D.   On: 04/20/2022 20:19   VAS [place] LOWER  EXTREMITY VENOUS (DVT)  Result Date: 04/20/2022  Lower Venous DVT Study Patient Name:  [name] [name]  Date of Exam:   04/18/2022 Medical Rec #: [id number]        Accession #:    [id number] Date of Birth: 1955-12-14        Patient Gender: M Patient Age:   25 years Exam Location:  [facility] Procedure:      VAS [place] LOWER EXTREMITY VENOUS (DVT) Referring Phys: A POWELL JR --------------------------------------------------------------------------------  Indications: Edema.  Comparison Study: 11-07-2020 Prior right lower extremity venous study was                   negative for DVT. Performing Technologist: Darlin Coco RDMS, RVT  Examination Guidelines: A complete evaluation includes B-mode imaging, spectral Doppler, color Doppler, and power Doppler as needed of all accessible portions  of each vessel. Bilateral testing is considered an integral part of a complete examination. Limited examinations for reoccurring indications may be performed as noted. The reflux portion of the exam is performed with the patient in reverse Trendelenburg.  +---------+---------------+---------+-----------+----------+--------------+ RIGHT    CompressibilityPhasicitySpontaneityPropertiesThrombus Aging +---------+---------------+---------+-----------+----------+--------------+ CFV      Full           Yes      Yes                                 +---------+---------------+---------+-----------+----------+--------------+ SFJ      Full                                                        +---------+---------------+---------+-----------+----------+--------------+ FV Prox  Full                                                        +---------+---------------+---------+-----------+----------+--------------+ FV Mid   Full                                                        +---------+---------------+---------+-----------+----------+--------------+ FV DistalFull                                                         +---------+---------------+---------+-----------+----------+--------------+ PFV      Full                                                        +---------+---------------+---------+-----------+----------+--------------+ POP      Full           Yes      Yes                                 +---------+---------------+---------+-----------+----------+--------------+ PTV      Full                                                        +---------+---------------+---------+-----------+----------+--------------+ PERO     Full                                                        +---------+---------------+---------+-----------+----------+--------------+   +---------+---------------+---------+-----------+----------+--------------+ LEFT  CompressibilityPhasicitySpontaneityPropertiesThrombus Aging +---------+---------------+---------+-----------+----------+--------------+ CFV      Full           Yes      Yes                                 +---------+---------------+---------+-----------+----------+--------------+ SFJ      Full                                                        +---------+---------------+---------+-----------+----------+--------------+ FV Prox  Full                                                        +---------+---------------+---------+-----------+----------+--------------+ FV Mid   Full                                                        +---------+---------------+---------+-----------+----------+--------------+ FV DistalFull                                                        +---------+---------------+---------+-----------+----------+--------------+ PFV      Full                                                        +---------+---------------+---------+-----------+----------+--------------+ POP      Full           Yes      Yes                                  +---------+---------------+---------+-----------+----------+--------------+ PTV      Full                                                        +---------+---------------+---------+-----------+----------+--------------+ PERO     Full                                                        +---------+---------------+---------+-----------+----------+--------------+     Summary: RIGHT: - There is no evidence of deep vein thrombosis in the lower extremity.  - No cystic structure found in the popliteal fossa.  LEFT: - There is no evidence of deep vein thrombosis in the lower extremity.  - No cystic structure found in the  popliteal fossa.  *See table(s) above for measurements and observations. Electronically signed by Jamelle Haring on 04/20/2022 at 4:09:38 PM.    Final    CT MAXILLOFACIAL WO CONTRAST  Result Date: 04/19/2022 CLINICAL DATA:  Bacteremia and dental pain. Concern for dental infection. EXAM: CT MAXILLOFACIAL WITHOUT CONTRAST TECHNIQUE: Multidetector CT imaging of the maxillofacial structures was performed. Multiplanar CT image reconstructions were also generated. RADIATION DOSE REDUCTION: This exam was performed according to the departmental dose-optimization program which includes automated exposure control, adjustment of the mA and/or kV according to patient size and/or use of iterative reconstruction technique. COMPARISON:  Head MRI and head CT 04/12/2022 FINDINGS: Osseous: No acute fracture. Absent maxillary and largely absent mandibular dentition. Two small remaining teeth/teeth fragments in the mandibular left lateral incisor and canine region and one in the right mandibular premolar region. Mild periapical lucency surrounding the two left-sided teeth with possible mild overlying soft tissue swelling. No evidence of a drainable fluid collection on this unenhanced study. Orbits: Unremarkable. Sinuses: Minimal mucosal thickening in the paranasal sinuses. No fluid. Clear mastoid air cells  and middle ear cavities. Soft tissues: Mild atherosclerotic calcification of the carotid bifurcations. Limited intracranial: No acute finding. IMPRESSION: Largely absent dentition. Mild periapical lucency surrounding the two remaining left-sided mandibular teeth fragments with possible mild overlying soft tissue swelling. No evidence of a drainable fluid collection on this unenhanced study. Electronically Signed   By: Logan Bores M.D.   On: 04/19/2022 18:13   MR LUMBAR SPINE WO CONTRAST  Result Date: 04/18/2022 CLINICAL DATA:  Low back pain, infection suspected EXAM: MRI LUMBAR SPINE WITHOUT CONTRAST TECHNIQUE: Multiplanar, multisequence MR imaging of the lumbar spine was performed. No intravenous contrast was administered. COMPARISON:  No prior MRI of the lumbar spine, correlation is made with CT lumbar spine 04/17/2022 FINDINGS: Evaluation is somewhat limited by motion artifact. Segmentation:  5 lumbar type vertebral bodies. Alignment: No significant listhesis. Straightening of the normal lumbar lordosis. Vertebrae: No fracture, evidence of discitis, or bone lesion. Congenitally short pedicles, which narrow the AP diameter of the spinal canal. Conus medullaris and cauda equina: Limited evaluation due to motion artifact and thecal sac narrowing, in part due to epidural lipomatosis. Paraspinal and other soft tissues: Limited by motion. No acute finding. Disc levels: Disc hydration is largely preserved. T12-L1: No significant disc bulge. Moderate thecal sac narrowing, in part secondary to epidural lipomatosis. No neural foraminal narrowing. L1-L2: No significant disc bulge. Mild facet arthropathy. Moderate thecal sac narrowing, in part secondary to epidural lipomatosis. No neural foraminal narrowing. L2-L3: No significant disc bulge. Mild facet arthropathy. Mild to moderate thecal sac narrowing, in part secondary to epidural lipomatosis. No neural foraminal narrowing. L3-L4: Minimal disc bulge. Moderate facet  arthropathy. Moderate thecal sac narrowing, in part secondary to epidural lipomatosis. Mild bilateral neural foraminal narrowing. L4-L5: Mild disc bulge. Moderate facet arthropathy. Moderate thecal sac narrowing, in part secondary to epidural lipomatosis. Mild bilateral neural foraminal narrowing. L5-S1: Minimal disc bulge. Mild facet arthropathy. Moderate thecal sac narrowing, secondary to epidural lipomatosis. Mild right neural foraminal narrowing. IMPRESSION: 1. Evaluation is limited by motion artifact. 2. No evidence of discitis-osteomyelitis. No epidural abscess. 3. Congenitally short pedicles, which narrow the AP diameter of the spinal canal, and epidural lipomatosis, which results in moderate thecal sac narrowing at multiple levels. 4. Mild neural foraminal narrowing bilaterally at L3-L4 and L4-L5 and on the right at L5-S1. Electronically Signed   By: Merilyn Baba M.D.   On: 04/18/2022 23:05   NM  Pulmonary Perfusion  Result Date: 04/18/2022 CLINICAL DATA:  Hypoxia EXAM: NUCLEAR MEDICINE PERFUSION LUNG SCAN TECHNIQUE: Perfusion images were obtained in multiple projections after intravenous injection of radiopharmaceutical. Ventilation scans intentionally deferred if perfusion scan and chest x-ray adequate for interpretation during COVID 19 epidemic. RADIOPHARMACEUTICALS:  4.0 mCi Tc-70m MAA IV COMPARISON:  Portable chest radiograph and CT from the same day FINDINGS: Significant perfusion attenuation involving the entire right upper lobe. Otherwise physiologic distribution of radiopharmaceutical. IMPRESSION: Solitary right upper lobe perfusion defect without radiographic correlate, suggestive of pulmonary embolus. Electronically Signed   By: Lucrezia Europe M.D.   On: 04/18/2022 14:53   DG CHEST PORT 1 VIEW  Result Date: 04/18/2022 CLINICAL DATA:  Respiratory failure with hypoxia. EXAM: PORTABLE CHEST 1 VIEW COMPARISON:  April 17, 2022. FINDINGS: Stable cardiomegaly with mild central pulmonary vascular  congestion. Status post coronary artery bypass graft. Lungs are clear. Bony thorax is unremarkable. IMPRESSION: Stable cardiomegaly with mild central pulmonary vascular congestion. Electronically Signed   By: Marijo Conception M.D.   On: 04/18/2022 12:49   CT CHEST WO CONTRAST  Result Date: 04/18/2022 CLINICAL DATA:  Pneumonia, complication suspected, xray done EXAM: CT CHEST WITHOUT CONTRAST TECHNIQUE: Multidetector CT imaging of the chest was performed following the standard protocol without IV contrast. RADIATION DOSE REDUCTION: This exam was performed according to the departmental dose-optimization program which includes automated exposure control, adjustment of the mA and/or kV according to patient size and/or use of iterative reconstruction technique. COMPARISON:  04/17/2022 FINDINGS: Cardiovascular: Unenhanced imaging of the heart demonstrates moderate cardiomegaly, with prominent dilatation of the atria. No pericardial effusion. Postsurgical changes from median sternotomy and CABG. Diffuse atherosclerosis throughout the native coronary vessels. Normal caliber of the thoracic aorta. Atherosclerosis of the aortic arch. Evaluation of the vascular lumen is limited without IV contrast. Mediastinum/Nodes: No enlarged mediastinal or axillary lymph nodes. Thyroid gland, trachea, and esophagus demonstrate no significant findings. Lungs/Pleura: No acute airspace disease, effusion, or pneumothorax. Central airways are patent. Upper Abdomen: No acute abnormality. Musculoskeletal: No acute or destructive bony lesions. Reconstructed images demonstrate no additional findings. IMPRESSION: 1. No acute intrathoracic process. 2. Stable cardiomegaly, with prominent biatrial dilatation. 3.  Aortic Atherosclerosis (ICD10-I70.0). Electronically Signed   By: Randa Ngo M.D.   On: 04/18/2022 10:41   ECHOCARDIOGRAM COMPLETE  Result Date: 04/17/2022    ECHOCARDIOGRAM REPORT   Patient Name:   [name] [name] Date of Exam:  04/17/2022 Medical Rec #:  [id number]       Height:       71.0 in Accession #:    [id number]      Weight:       306.4 lb Date of Birth:  January 14, 1956       BSA:          2.528 m Patient Age:    54 years        BP:           103/69 mmHg Patient Gender: M               HR:           84 bpm. Exam Location:  Inpatient Procedure: 2D Echo, Color Doppler, Cardiac Doppler and Intracardiac            Opacification Agent Indications:    I50.9* Heart failure (unspecified)  History:        Patient has prior history of Echocardiogram examinations, most  recent 05/07/2017. CAD, Prior CABG; Risk Factors:Hypertension,                 Diabetes, Dyslipidemia and Sleep Apnea.  Sonographer:    Raquel Sarna Senior RDCS Referring Phys: [id number] ALLISON L ELLIS  Sonographer Comments: Very difficult study due to patient body habitus, scanned upright due to dyspnea. IMPRESSIONS  1. Left ventricular ejection fraction, by estimation, is 55 to 60%. The left ventricle has normal function. The left ventricle has no regional wall motion abnormalities. Left ventricular diastolic parameters were normal. There is the interventricular septum is flattened in diastole ('D' shaped left ventricle), consistent with right ventricular volume overload.  2. Right ventricular systolic function is severely reduced. The right ventricular size is moderately enlarged. There is normal pulmonary artery systolic pressure. The estimated right ventricular systolic pressure is [id number] mmHg, but this may be severely underestimated in the setting of torrential tricuspid regurgitation.  3. Left atrial size was mildly dilated.  4. Right atrial size was severely dilated.  5. The mitral valve is normal in structure. Mild mitral valve regurgitation.  6. 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  7. The aortic valve is tricuspid. There is mild calcification of the aortic valve. Aortic valve regurgitation is not visualized. Aortic valve  sclerosis/calcification is present, without any evidence of aortic stenosis. FINDINGS  Left Ventricle: Left ventricular ejection fraction, by estimation, is 55 to 60%. The left ventricle has normal function. The left ventricle has no regional wall motion abnormalities. Definity contrast agent was given IV to delineate the left ventricular  endocardial borders. The left ventricular internal cavity size was normal in size. There is no left ventricular hypertrophy. The interventricular septum is flattened in diastole ('D' shaped left ventricle), consistent with right ventricular volume overload. Left ventricular diastolic parameters were normal. Normal left ventricular filling pressure. Right Ventricle: The right ventricular size is moderately enlarged. Right vetricular wall thickness was not well visualized. Right ventricular systolic function is severely reduced. There is normal pulmonary artery systolic pressure. The tricuspid regurgitant velocity is 1.67 m/s, and with an assumed right atrial pressure of 15 mmHg, the estimated right ventricular systolic pressure is [id number] mmHg. Left Atrium: Left atrial size was mildly dilated. Right Atrium: Right atrial size was severely dilated. Pericardium: There is no evidence of pericardial effusion. Mitral Valve: The mitral valve is normal in structure. Mild mitral valve regurgitation, with eccentric posteriorly directed jet. Tricuspid Valve: There appears to be severe leaflet malcoaptation. The tricuspid valve is not well visualized. Tricuspid valve regurgitation is severe. Aortic Valve: The aortic valve is tricuspid. There is mild calcification of the aortic valve. Aortic valve regurgitation is not visualized. Aortic valve sclerosis/calcification is present, without any evidence of aortic stenosis. Pulmonic Valve: The pulmonic valve was normal in structure. Pulmonic valve regurgitation is trivial. Aorta: The aortic root and ascending aorta are structurally normal, with no  evidence of dilitation. Venous: The inferior vena cava and the hepatic vein show a pattern of systolic flow reversal, suggestive of tricuspid regurgitation. IAS/Shunts: No atrial level shunt detected by color flow Doppler.  LEFT VENTRICLE PLAX 2D LVIDd:         4.40 cm   Diastology LVIDs:         3.10 cm   LV e' medial:    6.64 cm/s LV PW:         0.90 cm   LV E/e' medial:  8.0 LV IVS:        0.90 cm  LV e' lateral:   11.53 cm/s LVOT diam:     2.00 cm   LV E/e' lateral: 4.6 LV SV:         31 LV SV Index:   12 LVOT Area:     3.14 cm  RIGHT VENTRICLE RV S prime:     4.90 cm/s TAPSE (M-mode): 1.1 cm LEFT ATRIUM             Index        RIGHT ATRIUM           Index LA diam:        4.40 cm 1.74 cm/m   RA Area:     38.40 cm LA Vol (A2C):   44.8 ml 17.72 ml/m  RA Volume:   171.00 ml 67.65 ml/m LA Vol (A4C):   52.5 ml 20.77 ml/m LA Biplane Vol: 52.8 ml 20.89 ml/m  AORTIC VALVE LVOT Vmax:   64.63 cm/s LVOT Vmean:  47.333 cm/s LVOT VTI:    0.097 m  AORTA Ao Root diam: 2.80 cm Ao Asc diam:  3.50 cm MV E velocity: 53.05 cm/s  TRICUSPID VALVE MV A velocity: 37.26 cm/s  TR Peak grad:   11.2 mmHg MV E/A ratio:  1.42        TR Vmax:        167.00 cm/s                             SHUNTS                            Systemic VTI:  0.10 m                            Systemic Diam: 2.00 cm Sanda Klein MD Electronically signed by Sanda Klein MD Signature Date/Time: 04/17/2022/4:11:22 PM    Final    CT L-SPINE NO CHARGE  Result Date: 04/17/2022 CLINICAL DATA:  Low back pain, increased fracture risk.  Back pain. EXAM: CT LUMBAR SPINE WITHOUT CONTRAST TECHNIQUE: Multidetector CT imaging of the lumbar spine was performed without intravenous contrast administration. Multiplanar CT image reconstructions were also generated. RADIATION DOSE REDUCTION: This exam was performed according to the departmental dose-optimization program which includes automated exposure control, adjustment of the mA and/or kV according to patient size  and/or use of iterative reconstruction technique. COMPARISON:  CT abdomen and pelvis performed today FINDINGS: Segmentation: 5 lumbar type vertebrae. Alignment: Normal. Vertebrae: No acute fracture or focal pathologic process. Paraspinal and other soft tissues: Negative. Disc levels: Disc spaces maintained.  No disc herniation. IMPRESSION: No acute bony abnormality. Electronically Signed   By: Rolm Baptise M.D.   On: 04/17/2022 02:15   CT ABDOMEN PELVIS WO CONTRAST  Result Date: 04/17/2022 CLINICAL DATA:  Abdominal/flank pain, stone suspected.  Back pain. EXAM: CT ABDOMEN AND PELVIS WITHOUT CONTRAST TECHNIQUE: Multidetector CT imaging of the abdomen and pelvis was performed following the standard protocol without IV contrast. RADIATION DOSE REDUCTION: This exam was performed according to the departmental dose-optimization program which includes automated exposure control, adjustment of the mA and/or kV according to patient size and/or use of iterative reconstruction technique. COMPARISON:  None Available. FINDINGS: Lower chest: No acute abnormality Hepatobiliary: No focal hepatic abnormality. Gallbladder unremarkable. Pancreas: No focal abnormality or ductal dilatation. Spleen: No focal abnormality.  Normal size. Adrenals/Urinary Tract: No adrenal abnormality.  No focal renal abnormality. No stones or hydronephrosis. Urinary bladder is unremarkable. Stomach/Bowel: Normal appendix. Stomach, large and small bowel grossly unremarkable. Vascular/Lymphatic: Aortic atherosclerosis. No aneurysm. Shotty retroperitoneal lymph nodes measuring up to 8 mm in short axis diameter. No pathologically enlarged lymph nodes. Reproductive: No visible focal abnormality. Other: No free fluid or free air. Musculoskeletal: No acute bony abnormality. IMPRESSION: No acute findings in the abdomen or pelvis. Aortic atherosclerosis. Electronically Signed   By: Rolm Baptise M.D.   On: 04/17/2022 02:14   DG Chest Port 1 View  Result Date:  04/17/2022 CLINICAL DATA:  Chest pain EXAM: PORTABLE CHEST 1 VIEW COMPARISON:  08/08/2021 FINDINGS: Cardiac shadow is enlarged but stable. Postsurgical changes are again noted. Lungs are well aerated bilaterally. Central vascular congestion without significant edema is noted. No bony abnormality is noted. IMPRESSION: Changes of mild CHF. Electronically Signed   By: Inez Catalina M.D.   On: 04/17/2022 00:40   MR Brain Wo Contrast (neuro protocol)  Result Date: 04/12/2022 CLINICAL DATA:  Slurred speech. EXAM: MRI HEAD WITHOUT CONTRAST TECHNIQUE: Multiplanar, multiecho pulse sequences of the brain and surrounding structures were obtained without intravenous contrast. COMPARISON:  Same-day CT head FINDINGS: Brain: There is no acute intracranial hemorrhage, extra-axial fluid collection, or acute infarct. Parenchymal volume is normal for age. The ventricles are normal in size. There is mild background chronic small-vessel ischemic change. The pituitary and suprasellar region are normal. There is no mass lesion. There is no mass effect or midline shift. Vascular: Normal flow voids. Skull and upper cervical spine: Normal marrow signal. Sinuses/Orbits: There is mucosal thickening in the right sphenoid sinus. The globes and orbits are unremarkable. Other: None. IMPRESSION: Unremarkable for age brain MRI with no acute intracranial pathology. Electronically Signed   By: Valetta Mole M.D.   On: 04/12/2022 19:02   CT HEAD WO CONTRAST  Result Date: 04/12/2022 CLINICAL DATA:  Neuro deficit, acute, stroke suspected EXAM: CT HEAD WITHOUT CONTRAST TECHNIQUE: Contiguous axial images were obtained from the base of the skull through the vertex without intravenous contrast. RADIATION DOSE REDUCTION: This exam was performed according to the departmental dose-optimization program which includes automated exposure control, adjustment of the mA and/or kV according to patient size and/or use of iterative reconstruction technique.  COMPARISON:  None Available. FINDINGS: Brain: There is periventricular white matter decreased attenuation consistent with small vessel ischemic changes. Ventricles, sulci and cisterns are prominent consistent with age related involutional changes. No acute intracranial hemorrhage, mass effect or shift. No hydrocephalus. Vascular: No hyperdense vessel or unexpected calcification. Skull: Normal. Negative for fracture or focal lesion. Sinuses/Orbits: No acute finding. IMPRESSION: Atrophy and chronic small vessel ischemic changes. No acute intracranial process identified. Electronically Signed   By: Sammie Bench M.D.   On: 04/12/2022 16:39   (Echo, Carotid, EGD, Colonoscopy, ERCP)    Subjective: Seen and examined.  Walked around the hallway without any shortness of breath.  Eager to go home.  Needs a cab.   Discharge Exam: Vitals:   04/26/22 0318 04/26/22 0857  BP: 120/75 115/76  Pulse:  77  Resp: 18 20  Temp: 98.1 F (36.7 C) 97.8 F (36.6 C)  SpO2:  96%   Vitals:   04/26/22 0012 04/26/22 0318 04/26/22 0500 04/26/22 0857  BP: 127/71 120/75  115/76  Pulse: 78   77  Resp: 18 18  20   Temp: 98.1 F (36.7 C) 98.1 F (36.7 C)  97.8 F (36.6 C)  TempSrc: Oral Oral  Oral  SpO2:  92%   96%  Weight: (!) 137.6 kg  (!) 137.6 kg   Height:        General: Pt is alert, awake, not in acute distress.  On room air. Morbidly obese gentleman sitting comfortably in chair. Cardiovascular: RRR, S1/S2 +, no rubs, no gallops Respiratory: CTA bilaterally, no wheezing, no rhonchi Abdominal: Soft, NT, ND, bowel sounds +, obese and pendulous. Extremities: no edema, no cyanosis    The results of significant diagnostics from this hospitalization (including imaging, microbiology, ancillary and laboratory) are listed below for reference.     Microbiology: Recent Results (from the past 240 hour(s))  Resp panel by RT-PCR (RSV, Flu A&B, Covid) Urine, Clean Catch     Status: None   Collection Time:  04/17/22  6:14 AM   Specimen: Urine, Clean Catch; Nasal Swab  Result Value Ref Range Status   SARS Coronavirus 2 by RT PCR NEGATIVE NEGATIVE Final   Influenza A by PCR NEGATIVE NEGATIVE Final   Influenza B by PCR NEGATIVE NEGATIVE Final    Comment: (NOTE) The Xpert Xpress SARS-CoV-2/FLU/RSV plus assay is intended as an aid in the diagnosis of influenza from Nasopharyngeal swab specimens and should not be used as a sole basis for treatment. Nasal washings and aspirates are unacceptable for Xpert Xpress SARS-CoV-2/FLU/RSV testing.  Fact Sheet for Patients: EntrepreneurPulse.com.au  Fact Sheet for Healthcare Providers: IncredibleEmployment.be  This test is not yet approved or cleared by the [place] FDA and has been authorized for detection and/or diagnosis of SARS-CoV-2 by FDA under an Emergency Use Authorization (EUA). This EUA will remain in effect (meaning this test can be used) for the duration of the COVID-19 declaration under Section 564(b)(1) of the Act, 21 U.S.C. section 360bbb-3(b)(1), unless the authorization is terminated or revoked.     Resp Syncytial Virus by PCR NEGATIVE NEGATIVE Final    Comment: (NOTE) Fact Sheet for Patients: EntrepreneurPulse.com.au  Fact Sheet for Healthcare Providers: IncredibleEmployment.be  This test is not yet approved or cleared by the [place] FDA and has been authorized for detection and/or diagnosis of SARS-CoV-2 by FDA under an Emergency Use Authorization (EUA). This EUA will remain in effect (meaning this test can be used) for the duration of the COVID-19 declaration under Section 564(b)(1) of the Act, 21 U.S.C. section 360bbb-3(b)(1), unless the authorization is terminated or revoked.  Performed at [facility], [place] [address] [place], [place] [address]   Respiratory (~20 pathogens) panel by PCR     Status: None   Collection Time: 04/17/22   7:19 AM   Specimen: Nasopharyngeal Swab; Respiratory  Result Value Ref Range Status   Adenovirus NOT DETECTED NOT DETECTED Final   Coronavirus 229E NOT DETECTED NOT DETECTED Final    Comment: (NOTE) The Coronavirus on the Respiratory Panel, DOES NOT test for the novel  Coronavirus (2019 nCoV)    Coronavirus HKU1 NOT DETECTED NOT DETECTED Final   Coronavirus NL63 NOT DETECTED NOT DETECTED Final   Coronavirus OC43 NOT DETECTED NOT DETECTED Final   Metapneumovirus NOT DETECTED NOT DETECTED Final   Rhinovirus / Enterovirus NOT DETECTED NOT DETECTED Final   Influenza A NOT DETECTED NOT DETECTED Final   Influenza B NOT DETECTED NOT DETECTED Final   Parainfluenza Virus 1 NOT DETECTED NOT DETECTED Final   Parainfluenza Virus 2 NOT DETECTED NOT DETECTED Final   Parainfluenza Virus 3 NOT DETECTED NOT DETECTED Final   Parainfluenza Virus 4 NOT DETECTED NOT DETECTED Final   Respiratory Syncytial Virus NOT DETECTED  NOT DETECTED Final   Bordetella pertussis NOT DETECTED NOT DETECTED Final   Bordetella Parapertussis NOT DETECTED NOT DETECTED Final   Chlamydophila pneumoniae NOT DETECTED NOT DETECTED Final   Mycoplasma pneumoniae NOT DETECTED NOT DETECTED Final    Comment: Performed at [facility], [place] [address], [place], [place] [address]  Culture, blood (Routine X 2) w Reflex to ID Panel     Status: Abnormal   Collection Time: 04/17/22  8:23 AM   Specimen: BLOOD  Result Value Ref Range Status   Specimen Description BLOOD RIGHT ANTECUBITAL  Final   Special Requests   Final    BOTTLES DRAWN AEROBIC AND ANAEROBIC Blood Culture results may not be optimal due to an inadequate volume of blood received in culture bottles   Culture  Setup Time   Final    GRAM POSITIVE COCCI IN CHAINS IN BOTH AEROBIC AND ANAEROBIC BOTTLES CRITICAL RESULT CALLED TO, READ BACK BY AND VERIFIED WITH: PHARMD LISA CURRAN ON 04/17/22 @ 2151 BY DRT    Culture (A)  Final    STREPTOCOCCUS PYOGENES HEALTH DEPARTMENT  NOTIFIED Performed at [facility], [place] [address], [place], [place] [address]    Report Status 04/19/2022 FINAL  Final   Organism ID, Bacteria STREPTOCOCCUS PYOGENES  Final      Susceptibility   Streptococcus pyogenes - MIC*    PENICILLIN <=0.06 SENSITIVE Sensitive     CEFTRIAXONE <=0.12 SENSITIVE Sensitive     ERYTHROMYCIN <=0.12 SENSITIVE Sensitive     LEVOFLOXACIN 0.5 SENSITIVE Sensitive     VANCOMYCIN 0.5 SENSITIVE Sensitive     * STREPTOCOCCUS PYOGENES  Blood Culture ID Panel (Reflexed)     Status: Abnormal   Collection Time: 04/17/22  8:23 AM  Result Value Ref Range Status   Enterococcus faecalis NOT DETECTED NOT DETECTED Final   Enterococcus Faecium NOT DETECTED NOT DETECTED Final   Listeria monocytogenes NOT DETECTED NOT DETECTED Final   Staphylococcus species NOT DETECTED NOT DETECTED Final   Staphylococcus aureus (BCID) NOT DETECTED NOT DETECTED Final   Staphylococcus epidermidis NOT DETECTED NOT DETECTED Final   Staphylococcus lugdunensis NOT DETECTED NOT DETECTED Final   Streptococcus species DETECTED (A) NOT DETECTED Final    Comment: CRITICAL RESULT CALLED TO, READ BACK BY AND VERIFIED WITH: PHARMD LISA CURRAN ON 04/17/22 @ 2151 BY DRT    Streptococcus agalactiae NOT DETECTED NOT DETECTED Final   Streptococcus pneumoniae NOT DETECTED NOT DETECTED Final   Streptococcus pyogenes DETECTED (A) NOT DETECTED Final    Comment: CRITICAL RESULT CALLED TO, READ BACK BY AND VERIFIED WITH: PHARMD LISA CURRAN ON 04/17/22 @ 2151 BY DRT    A.calcoaceticus-baumannii NOT DETECTED NOT DETECTED Final   Bacteroides fragilis NOT DETECTED NOT DETECTED Final   Enterobacterales NOT DETECTED NOT DETECTED Final   Enterobacter cloacae complex NOT DETECTED NOT DETECTED Final   Escherichia coli NOT DETECTED NOT DETECTED Final   Klebsiella aerogenes NOT DETECTED NOT DETECTED Final   Klebsiella oxytoca NOT DETECTED NOT DETECTED Final   Klebsiella pneumoniae NOT DETECTED NOT DETECTED Final    Proteus species NOT DETECTED NOT DETECTED Final   Salmonella species NOT DETECTED NOT DETECTED Final   Serratia marcescens NOT DETECTED NOT DETECTED Final   Haemophilus influenzae NOT DETECTED NOT DETECTED Final   Neisseria meningitidis NOT DETECTED NOT DETECTED Final   Pseudomonas aeruginosa NOT DETECTED NOT DETECTED Final   Stenotrophomonas maltophilia NOT DETECTED NOT DETECTED Final   Candida albicans NOT DETECTED NOT DETECTED Final  Candida auris NOT DETECTED NOT DETECTED Final   Candida glabrata NOT DETECTED NOT DETECTED Final   Candida krusei NOT DETECTED NOT DETECTED Final   Candida parapsilosis NOT DETECTED NOT DETECTED Final   Candida tropicalis NOT DETECTED NOT DETECTED Final   Cryptococcus neoformans/gattii NOT DETECTED NOT DETECTED Final    Comment: Performed at [facility], [place] [address], [place], [place] [address]  Culture, blood (Routine X 2) w Reflex to ID Panel     Status: Abnormal   Collection Time: 04/17/22  8:30 AM   Specimen: BLOOD LEFT FOREARM  Result Value Ref Range Status   Specimen Description BLOOD LEFT FOREARM  Final   Special Requests   Final    BOTTLES DRAWN AEROBIC AND ANAEROBIC Blood Culture adequate volume   Culture  Setup Time   Final    GRAM POSITIVE COCCI IN CHAINS IN BOTH AEROBIC AND ANAEROBIC BOTTLES CRITICAL VALUE NOTED.  VALUE IS CONSISTENT WITH PREVIOUSLY REPORTED AND CALLED VALUE.    Culture (A)  Final    STREPTOCOCCUS PYOGENES SUSCEPTIBILITIES PERFORMED ON PREVIOUS CULTURE WITHIN THE LAST 5 DAYS. Performed at [facility], [place] [address] [place], [place] [address]    Report Status 04/19/2022 FINAL  Final  Culture, blood (Routine X 2) w Reflex to ID Panel     Status: None   Collection Time: 04/19/22  1:23 AM   Specimen: BLOOD LEFT HAND  Result Value Ref Range Status   Specimen Description BLOOD LEFT HAND  Final   Special Requests   Final    BOTTLES DRAWN AEROBIC ONLY Blood Culture results may not be optimal due to an  inadequate volume of blood received in culture bottles   Culture   Final    NO GROWTH 5 DAYS Performed at [facility], [place] [address] [place], [place] [address]    Report Status 04/24/2022 FINAL  Final  Culture, blood (Routine X 2) w Reflex to ID Panel     Status: None   Collection Time: 04/19/22  1:25 AM   Specimen: BLOOD RIGHT HAND  Result Value Ref Range Status   Specimen Description BLOOD RIGHT HAND  Final   Special Requests   Final    BOTTLES DRAWN AEROBIC AND ANAEROBIC Blood Culture adequate volume   Culture   Final    NO GROWTH 5 DAYS Performed at [facility], [place] [address], [place], [place] [address]    Report Status 04/24/2022 FINAL  Final  Urine Culture (for pregnant, neutropenic or urologic patients or patients with an indwelling urinary catheter)     Status: None   Collection Time: 04/21/22  3:13 AM   Specimen: Urine, Clean Catch  Result Value Ref Range Status   Specimen Description URINE, CLEAN CATCH  Final   Special Requests NONE  Final   Culture   Final    NO GROWTH Performed at [facility], [place] [address] [place], [place] [address]    Report Status 04/22/2022 FINAL  Final     Labs: BNP (last 3 results) Recent Labs    04/17/22 0830 04/18/22 0050  BNP 1,006.1* 123XX123*   Basic Metabolic Panel: Recent Labs  Lab 04/20/22 0416 04/22/22 0058 04/23/22 0034 04/24/22 0048 04/25/22 0050 04/26/22 0041  NA 130* 136 134* 134* 132* 132*  K 3.9 3.5 3.5 3.7 3.2* 3.4*  CL 98 99 99 98 101 103  CO2 22 28 27 26 23  21*  GLUCOSE 223* 51* 102* 102*  141* 108*  BUN 37* 19 11 9  7* 7*  CREATININE 1.74* 1.51* 1.25* 1.17 1.18 1.03  CALCIUM 8.7* 8.7* 8.6* 8.5* 8.5* 8.5*  MG 2.1 1.7 1.5* 1.8 1.7 1.8  PHOS 2.5  --   --   --   --   --    Liver Function Tests: Recent Labs  Lab 04/20/22 0416 04/22/22 0058 04/23/22 0034 04/24/22 0048 04/25/22 0050  AST 63* 34 23 22 18   ALT 49* 33 25 21 20   ALKPHOS 104 141* 145* 145* 144*  BILITOT 1.9* 1.3* 1.3*  1.8* 1.6*  PROT 7.4 7.5 7.5 8.0 8.2*  ALBUMIN 2.4* 2.3* 2.2* 2.2* 2.2*   No results for input(s): "LIPASE", "AMYLASE" in the last 168 hours. No results for input(s): "AMMONIA" in the last 168 hours. CBC: Recent Labs  Lab 04/20/22 0416 04/21/22 0046 04/22/22 0058 04/23/22 0034 04/24/22 0048 04/25/22 0050 04/26/22 0041  WBC 13.8*   < > 12.2* 11.7* 7.5 7.6 6.0  NEUTROABS 10.0*  --   --   --   --   --   --   HGB 13.7   < > 13.8 13.7 15.6 13.3 13.1  HCT 39.1   < > 41.6 39.2 46.3 39.8 37.4*  MCV 99.2   < > 100.7* 99.5 101.1* 100.5* 99.2  PLT 145*   < > 188 184 175 247 239   < > = values in this interval not displayed.   Cardiac Enzymes: No results for input(s): "CKTOTAL", "CKMB", "CKMBINDEX", "TROPONINI" in the last 168 hours. BNP: Invalid input(s): "POCBNP" CBG: Recent Labs  Lab 04/25/22 1141 04/25/22 1647 04/25/22 2049 04/26/22 0533 04/26/22 0824  GLUCAP 82 108* 142* 89 131*   D-Dimer No results for input(s): "DDIMER" in the last 72 hours. Hgb A1c No results for input(s): "HGBA1C" in the last 72 hours. Lipid Profile No results for input(s): "CHOL", "HDL", "LDLCALC", "TRIG", "CHOLHDL", "LDLDIRECT" in the last 72 hours. Thyroid function studies No results for input(s): "TSH", "T4TOTAL", "T3FREE", "THYROIDAB" in the last 72 hours.  Invalid input(s): "FREET3" Anemia work up No results for input(s): "VITAMINB12", "FOLATE", "FERRITIN", "TIBC", "IRON", "RETICCTPCT" in the last 72 hours. Urinalysis    Component Value Date/Time   COLORURINE YELLOW 04/21/2022 0945   APPEARANCEUR CLEAR 04/21/2022 0945   LABSPEC 1.020 04/21/2022 0945   PHURINE 5.0 04/21/2022 0945   GLUCOSEU NEGATIVE 04/21/2022 0945   HGBUR SMALL (A) 04/21/2022 0945   BILIRUBINUR NEGATIVE 04/21/2022 0945   KETONESUR NEGATIVE 04/21/2022 0945   PROTEINUR NEGATIVE 04/21/2022 0945   UROBILINOGEN 1.0 04/05/2009 1943   NITRITE NEGATIVE 04/21/2022 0945   LEUKOCYTESUR NEGATIVE 04/21/2022 0945   Sepsis  Labs Recent Labs  Lab 04/23/22 0034 04/24/22 0048 04/25/22 0050 04/26/22 0041  WBC 11.7* 7.5 7.6 6.0   Microbiology Recent Results (from the past 240 hour(s))  Resp panel by RT-PCR (RSV, Flu A&B, Covid) Urine, Clean Catch     Status: None   Collection Time: 04/17/22  6:14 AM   Specimen: Urine, Clean Catch; Nasal Swab  Result Value Ref Range Status   SARS Coronavirus 2 by RT PCR NEGATIVE NEGATIVE Final   Influenza A by PCR NEGATIVE NEGATIVE Final   Influenza B by PCR NEGATIVE NEGATIVE Final    Comment: (NOTE) The Xpert Xpress SARS-CoV-2/FLU/RSV plus assay is intended as an aid in the diagnosis of influenza from Nasopharyngeal swab specimens and should not be used as a sole basis for treatment. Nasal washings and aspirates are unacceptable for Xpert Xpress SARS-CoV-2/FLU/RSV testing.  Fact Sheet for Patients: EntrepreneurPulse.com.au  Fact Sheet for Healthcare Providers: IncredibleEmployment.be  This test is not yet approved or cleared by the [place] FDA and has been authorized for detection and/or diagnosis of SARS-CoV-2 by FDA under an Emergency Use Authorization (EUA). This EUA will remain in effect (meaning this test can be used) for the duration of the COVID-19 declaration under Section 564(b)(1) of the Act, 21 U.S.C. section 360bbb-3(b)(1), unless the authorization is terminated or revoked.     Resp Syncytial Virus by PCR NEGATIVE NEGATIVE Final    Comment: (NOTE) Fact Sheet for Patients: EntrepreneurPulse.com.au  Fact Sheet for Healthcare Providers: IncredibleEmployment.be  This test is not yet approved or cleared by the [place] FDA and has been authorized for detection and/or diagnosis of SARS-CoV-2 by FDA under an Emergency Use Authorization (EUA). This EUA will remain in effect (meaning this test can be used) for the duration of the COVID-19 declaration under Section 564(b)(1)  of the Act, 21 U.S.C. section 360bbb-3(b)(1), unless the authorization is terminated or revoked.  Performed at [facility], [place] [address], [place], [place] [address]   Respiratory (~20 pathogens) panel by PCR     Status: None   Collection Time: 04/17/22  7:19 AM   Specimen: Nasopharyngeal Swab; Respiratory  Result Value Ref Range Status   Adenovirus NOT DETECTED NOT DETECTED Final   Coronavirus 229E NOT DETECTED NOT DETECTED Final    Comment: (NOTE) The Coronavirus on the Respiratory Panel, DOES NOT test for the novel  Coronavirus (2019 nCoV)    Coronavirus HKU1 NOT DETECTED NOT DETECTED Final   Coronavirus NL63 NOT DETECTED NOT DETECTED Final   Coronavirus OC43 NOT DETECTED NOT DETECTED Final   Metapneumovirus NOT DETECTED NOT DETECTED Final   Rhinovirus / Enterovirus NOT DETECTED NOT DETECTED Final   Influenza A NOT DETECTED NOT DETECTED Final   Influenza B NOT DETECTED NOT DETECTED Final   Parainfluenza Virus 1 NOT DETECTED NOT DETECTED Final   Parainfluenza Virus 2 NOT DETECTED NOT DETECTED Final   Parainfluenza Virus 3 NOT DETECTED NOT DETECTED Final   Parainfluenza Virus 4 NOT DETECTED NOT DETECTED Final   Respiratory Syncytial Virus NOT DETECTED NOT DETECTED Final   Bordetella pertussis NOT DETECTED NOT DETECTED Final   Bordetella Parapertussis NOT DETECTED NOT DETECTED Final   Chlamydophila pneumoniae NOT DETECTED NOT DETECTED Final   Mycoplasma pneumoniae NOT DETECTED NOT DETECTED Final    Comment: Performed at [facility], [place]. [address] [place], [place] [address]  Culture, blood (Routine X 2) w Reflex to ID Panel     Status: Abnormal   Collection Time: 04/17/22  8:23 AM   Specimen: BLOOD  Result Value Ref Range Status   Specimen Description BLOOD RIGHT ANTECUBITAL  Final   Special Requests   Final    BOTTLES DRAWN AEROBIC AND ANAEROBIC Blood Culture results may not be optimal due to an inadequate volume of blood received in culture bottles   Culture   Setup Time   Final    GRAM POSITIVE COCCI IN CHAINS IN BOTH AEROBIC AND ANAEROBIC BOTTLES CRITICAL RESULT CALLED TO, READ BACK BY AND VERIFIED WITH: PHARMD LISA CURRAN ON 04/17/22 @ 2151 BY DRT    Culture (A)  Final    STREPTOCOCCUS PYOGENES HEALTH DEPARTMENT NOTIFIED Performed at [facility], [place] [address] [place], [place] [address]    Report Status 04/19/2022 FINAL  Final   Organism ID, Bacteria STREPTOCOCCUS PYOGENES  Final  Susceptibility   Streptococcus pyogenes - MIC*    PENICILLIN <=0.06 SENSITIVE Sensitive     CEFTRIAXONE <=0.12 SENSITIVE Sensitive     ERYTHROMYCIN <=0.12 SENSITIVE Sensitive     LEVOFLOXACIN 0.5 SENSITIVE Sensitive     VANCOMYCIN 0.5 SENSITIVE Sensitive     * STREPTOCOCCUS PYOGENES  Blood Culture ID Panel (Reflexed)     Status: Abnormal   Collection Time: 04/17/22  8:23 AM  Result Value Ref Range Status   Enterococcus faecalis NOT DETECTED NOT DETECTED Final   Enterococcus Faecium NOT DETECTED NOT DETECTED Final   Listeria monocytogenes NOT DETECTED NOT DETECTED Final   Staphylococcus species NOT DETECTED NOT DETECTED Final   Staphylococcus aureus (BCID) NOT DETECTED NOT DETECTED Final   Staphylococcus epidermidis NOT DETECTED NOT DETECTED Final   Staphylococcus lugdunensis NOT DETECTED NOT DETECTED Final   Streptococcus species DETECTED (A) NOT DETECTED Final    Comment: CRITICAL RESULT CALLED TO, READ BACK BY AND VERIFIED WITH: PHARMD LISA CURRAN ON 04/17/22 @ 2151 BY DRT    Streptococcus agalactiae NOT DETECTED NOT DETECTED Final   Streptococcus pneumoniae NOT DETECTED NOT DETECTED Final   Streptococcus pyogenes DETECTED (A) NOT DETECTED Final    Comment: CRITICAL RESULT CALLED TO, READ BACK BY AND VERIFIED WITH: PHARMD LISA CURRAN ON 04/17/22 @ 2151 BY DRT    A.calcoaceticus-baumannii NOT DETECTED NOT DETECTED Final   Bacteroides fragilis NOT DETECTED NOT DETECTED Final   Enterobacterales NOT DETECTED NOT DETECTED Final   Enterobacter  cloacae complex NOT DETECTED NOT DETECTED Final   Escherichia coli NOT DETECTED NOT DETECTED Final   Klebsiella aerogenes NOT DETECTED NOT DETECTED Final   Klebsiella oxytoca NOT DETECTED NOT DETECTED Final   Klebsiella pneumoniae NOT DETECTED NOT DETECTED Final   Proteus species NOT DETECTED NOT DETECTED Final   Salmonella species NOT DETECTED NOT DETECTED Final   Serratia marcescens NOT DETECTED NOT DETECTED Final   Haemophilus influenzae NOT DETECTED NOT DETECTED Final   Neisseria meningitidis NOT DETECTED NOT DETECTED Final   Pseudomonas aeruginosa NOT DETECTED NOT DETECTED Final   Stenotrophomonas maltophilia NOT DETECTED NOT DETECTED Final   Candida albicans NOT DETECTED NOT DETECTED Final   Candida auris NOT DETECTED NOT DETECTED Final   Candida glabrata NOT DETECTED NOT DETECTED Final   Candida krusei NOT DETECTED NOT DETECTED Final   Candida parapsilosis NOT DETECTED NOT DETECTED Final   Candida tropicalis NOT DETECTED NOT DETECTED Final   Cryptococcus neoformans/gattii NOT DETECTED NOT DETECTED Final    Comment: Performed at [facility], [address], [place], [place] [address]  Culture, blood (Routine X 2) w Reflex to ID Panel     Status: Abnormal   Collection Time: 04/17/22  8:30 AM   Specimen: BLOOD LEFT FOREARM  Result Value Ref Range Status   Specimen Description BLOOD LEFT FOREARM  Final   Special Requests   Final    BOTTLES DRAWN AEROBIC AND ANAEROBIC Blood Culture adequate volume   Culture  Setup Time   Final    GRAM POSITIVE COCCI IN CHAINS IN BOTH AEROBIC AND ANAEROBIC BOTTLES CRITICAL VALUE NOTED.  VALUE IS CONSISTENT WITH PREVIOUSLY REPORTED AND CALLED VALUE.    Culture (A)  Final    STREPTOCOCCUS PYOGENES SUSCEPTIBILITIES PERFORMED ON PREVIOUS CULTURE WITHIN THE LAST 5 DAYS. Performed at [facility], [place] [address], [place], [place] [address]    Report Status 04/19/2022 FINAL  Final  Culture, blood (Routine X 2) w Reflex to ID Panel  Status: None   Collection Time: 04/19/22  1:23 AM   Specimen: BLOOD LEFT HAND  Result Value Ref Range Status   Specimen Description BLOOD LEFT HAND  Final   Special Requests   Final    BOTTLES DRAWN AEROBIC ONLY Blood Culture results may not be optimal due to an inadequate volume of blood received in culture bottles   Culture   Final    NO GROWTH 5 DAYS Performed at [facility], [place] [address] [place], [place] [address]    Report Status 04/24/2022 FINAL  Final  Culture, blood (Routine X 2) w Reflex to ID Panel     Status: None   Collection Time: 04/19/22  1:25 AM   Specimen: BLOOD RIGHT HAND  Result Value Ref Range Status   Specimen Description BLOOD RIGHT HAND  Final   Special Requests   Final    BOTTLES DRAWN AEROBIC AND ANAEROBIC Blood Culture adequate volume   Culture   Final    NO GROWTH 5 DAYS Performed at [facility], [place] [address], [place], [place] [address]    Report Status 04/24/2022 FINAL  Final  Urine Culture (for pregnant, neutropenic or urologic patients or patients with an indwelling urinary catheter)     Status: None   Collection Time: 04/21/22  3:13 AM   Specimen: Urine, Clean Catch  Result Value Ref Range Status   Specimen Description URINE, CLEAN CATCH  Final   Special Requests NONE  Final   Culture   Final    NO GROWTH Performed at [facility], [place] [address] [place], [place] [address]    Report Status 04/22/2022 FINAL  Final     Time coordinating discharge: 35 minutes  SIGNED:   Barb Merino, MD  [facility] 04/26/2022, 9:47 AM

## 2022-04-26 NOTE — Social Work (Signed)
CSW was informed that pt needed transportation home. CSW asked RN to verify address on face sheet. Address was confirmed and voucher was provided.

## 2022-04-26 NOTE — Progress Notes (Signed)
Mobility Specialist Progress Note:   04/26/22 0930  Mobility  Activity Ambulated with assistance in hallway  Level of Assistance Standby assist, set-up cues, supervision of patient - no hands on  Assistive Device Four wheel walker  Distance Ambulated (ft) 100 ft  Activity Response Tolerated well  Mobility Referral Yes  $Mobility charge 1 Mobility   Pt eager for mobility session. Required no physical assistance throughout session. No rest break required. SpO2 98% on RA. Pt back in chair with all needs met.   Nelta Numbers Mobility Specialist Please contact via SecureChat or  Rehab office at [phone]

## 2022-04-26 NOTE — Progress Notes (Signed)
Rounding Note    Patient Name: [name] [name] Date of Encounter: 04/26/2022  [facility] Cardiologist: Candee Furbish, MD   Subjective   Urinated 2600 ml, but 2500 in so about net even. Not on diuretic. Still volume overloaded. RV is large and TEE today suggests moderate RV dysfunction. Renal function has normalized.  TEE today did not show vegetation. There is severe TR. He is off oxygen.  Inpatient Medications    Scheduled Meds:  amoxicillin  1,000 mg Oral Q8H   apixaban  10 mg Oral BID   Followed by   Derrill Memo ON 05/02/2022] apixaban  5 mg Oral BID   artificial tears   Right Eye Q4H   aspirin EC  81 mg Oral Daily   famotidine  20 mg Oral Daily   insulin aspart  0-20 Units Subcutaneous TID WC   insulin aspart  0-5 Units Subcutaneous QHS   insulin aspart  4 Units Subcutaneous TID WC   insulin glargine-yfgn  20 Units Subcutaneous Daily   magnesium oxide  400 mg Oral BID   metoprolol tartrate  12.5 mg Oral BID   pantoprazole  40 mg Oral Daily   polyethylene glycol  17 g Oral Daily   rosuvastatin  20 mg Oral QPM   sodium chloride flush  3 mL Intravenous Q12H   spironolactone  12.5 mg Oral Daily   Continuous Infusions:   PRN Meds: acetaminophen **OR** acetaminophen, diclofenac Sodium, guaiFENesin, hydrALAZINE, [COMPLETED] ipratropium-albuterol **FOLLOWED BY** ipratropium-albuterol, metoprolol tartrate, ondansetron **OR** ondansetron (ZOFRAN) IV, pregabalin, senna-docusate, traZODone   Vital Signs    Vitals:   04/25/22 2100 04/26/22 0012 04/26/22 0318 04/26/22 0500  BP:  127/71 120/75   Pulse:  78    Resp:  18 18   Temp:  98.1 F (36.7 C) 98.1 F (36.7 C)   TempSrc:  Oral Oral   SpO2: 95% 92%    Weight:  (!) 137.6 kg  (!) 137.6 kg  Height:        Intake/Output Summary (Last 24 hours) at 04/26/2022 0857 Last data filed at 04/26/2022 0446 Gross per 24 hour  Intake 351.82 ml  Output 1850 ml  Net -1498.18 ml      04/26/2022    5:00 AM 04/26/2022   12:12  AM 04/25/2022    8:18 AM  Last 3 Weights  Weight (lbs) 303 lb 5.7 oz 303 lb 5.7 oz 302 lb 0.5 oz  Weight (kg) 137.6 kg 137.6 kg 137 kg      Telemetry    NSR rate of 80- Personally Reviewed  ECG    None new - Personally Reviewed  Physical Exam   GEN: No acute distress.   Neck: No JVD Cardiac: RRR, no murmurs, rubs, or gallops.  Respiratory: decreased breath sounds at the bases GI: firm distended abdomen MS: trace edema Neuro:  Nonfocal  Psych: Normal affect   Labs    High Sensitivity Troponin:   Recent Labs  Lab 04/17/22 0045 04/17/22 0240  TROPONINIHS 11 11     Chemistry Recent Labs  Lab 04/23/22 0034 04/24/22 0048 04/25/22 0050 04/26/22 0041  NA 134* 134* 132* 132*  K 3.5 3.7 3.2* 3.4*  CL 99 98 101 103  CO2 27 26 23  21*  GLUCOSE 102* 102* 141* 108*  BUN 11 9 7* 7*  CREATININE 1.25* 1.17 1.18 1.03  CALCIUM 8.6* 8.5* 8.5* 8.5*  MG 1.5* 1.8 1.7 1.8  PROT 7.5 8.0 8.2*  --   ALBUMIN 2.2* 2.2*  2.2*  --   AST 23 22 18   --   ALT 25 21 20   --   ALKPHOS 145* 145* 144*  --   BILITOT 1.3* 1.8* 1.6*  --   GFRNONAA >60 >60 >60 >60  ANIONGAP 8 10 8 8     Lipids No results for input(s): "CHOL", "TRIG", "HDL", "LABVLDL", "LDLCALC", "CHOLHDL" in the last 168 hours.  Hematology Recent Labs  Lab 04/24/22 0048 04/25/22 0050 04/26/22 0041  WBC 7.5 7.6 6.0  RBC 4.58 3.96* 3.77*  HGB 15.6 13.3 13.1  HCT 46.3 39.8 37.4*  MCV 101.1* 100.5* 99.2  MCH 34.1* 33.6 34.7*  MCHC 33.7 33.4 35.0  RDW 15.6* 15.3 15.5  PLT 175 247 239   Thyroid No results for input(s): "TSH", "FREET4" in the last 168 hours.  BNP No results for input(s): "BNP", "PROBNP" in the last 168 hours.   DDimer No results for input(s): "DDIMER" in the last 168 hours.   Radiology    ECHO TEE  Result Date: 04/25/2022    TRANSESOPHOGEAL ECHO REPORT   Patient Name:   [name] [name] Date of Exam: 04/25/2022 Medical Rec #:  [id number]       Height:       71.0 in Accession #:    [id number]      Weight:        302.0 lb Date of Birth:  08-05-1955       BSA:          2.512 m Patient Age:    67 years        BP:           125/73 mmHg Patient Gender: M               HR:           95 bpm. Exam Location:  Inpatient Procedure: Transesophageal Echo, Color Doppler and Cardiac Doppler Indications:     Bacteremia  History:         Patient has prior history of Echocardiogram examinations, most                  recent 04/17/2022. CAD, Prior CABG, PVD; Risk                  Factors:Hypertension, Diabetes, Dyslipidemia and Former Smoker.  Sonographer:     Wilkie Aye RVT Referring Phys:  [id number] Lily Kocher Diagnosing Phys: Kirk Ruths MD PROCEDURE: After discussion of the risks and benefits of a TEE, an informed consent was obtained from the patient. The transesophogeal probe was passed without difficulty through the esophogus of the patient. Local oropharyngeal anesthetic was provided with Cetacaine. Sedation performed by different physician. The patient was monitored while under deep sedation. Anesthestetic sedation was provided intravenously by Anesthesiology: 80mg  of Propofol. The patient developed no complications during the procedure.  IMPRESSIONS  1. Small oscillating density on aortic valve (possible vegetation vs Lambl's excrescence); TV leaflets do not coapt with wide open TR.  2. Left ventricular ejection fraction, by estimation, is 55 to 60%. The left ventricle has normal function. The left ventricle has no regional wall motion abnormalities.  3. Right ventricular systolic function is moderately reduced. The right ventricular size is severely enlarged.  4. Left atrial size was mildly dilated. No left atrial/left atrial appendage thrombus was detected.  5. Right atrial size was severely dilated.  6. The mitral valve is normal in structure. Mild mitral valve regurgitation.  7. The tricuspid valve is abnormal.  Tricuspid valve regurgitation is severe.  8. The aortic valve is tricuspid. Aortic valve regurgitation is  trivial.  9. Aortic dilatation noted. There is mild dilatation of the aortic root, measuring 39 mm. There is mild (Grade II) plaque involving the descending aorta. FINDINGS  Left Ventricle: Left ventricular ejection fraction, by estimation, is 55 to 60%. The left ventricle has normal function. The left ventricle has no regional wall motion abnormalities. The left ventricular internal cavity size was normal in size. Right Ventricle: The right ventricular size is severely enlarged. Right ventricular systolic function is moderately reduced. Left Atrium: Left atrial size was mildly dilated. No left atrial/left atrial appendage thrombus was detected. Right Atrium: Right atrial size was severely dilated. Pericardium: There is no evidence of pericardial effusion. Mitral Valve: The mitral valve is normal in structure. Mild mitral valve regurgitation. Tricuspid Valve: The tricuspid valve is abnormal. Tricuspid valve regurgitation is severe. Aortic Valve: The aortic valve is tricuspid. Aortic valve regurgitation is trivial. Pulmonic Valve: The pulmonic valve was normal in structure. Pulmonic valve regurgitation is trivial. Aorta: Aortic dilatation noted. There is mild dilatation of the aortic root, measuring 39 mm. There is mild (Grade II) plaque involving the descending aorta. IAS/Shunts: There is left bowing of the interatrial septum, suggestive of elevated right atrial pressure. No atrial level shunt detected by color flow Doppler. Additional Comments: Small oscillating density on aortic valve (possible vegetation vs Lambl's excrescence); TV leaflets do not coapt with wide open TR.  AORTA Ao Root diam: 3.10 cm Kirk Ruths MD Electronically signed by Kirk Ruths MD Signature Date/Time: 04/25/2022/1:38:50 PM    Final     Cardiac Studies   Echo 04/17/22: 1. Left ventricular ejection fraction, by estimation, is 55 to 60%. The  left ventricle has normal function. The left ventricle has no regional  wall motion  abnormalities. Left ventricular diastolic parameters were  normal. There is the interventricular  septum is flattened in diastole ('D' shaped left ventricle), consistent  with right ventricular volume overload.   2. Right ventricular systolic function is severely reduced. The right  ventricular size is moderately enlarged. There is normal pulmonary artery  systolic pressure. The estimated right ventricular systolic pressure is  [id number] mmHg, but this may be severely  underestimated in the setting of torrential tricuspid regurgitation.   3. Left atrial size was mildly dilated.   4. Right atrial size was severely dilated.   5. The mitral valve is normal in structure. Mild mitral valve  regurgitation.   6. Tricuspid valve regurgitation is torrential, due to severe leaflet  malcoaptation.   7. The aortic valve is tricuspid. There is mild calcification of the  aortic valve. Aortic valve regurgitation is not visualized. Aortic valve  sclerosis/calcification is present, without any evidence of aortic  stenosis.   Patient Profile     67 y.o. male  hx of CAD s/p CABG [id number], chronic diastolic heart failure, hypertension, liver cirrhosis, hypokalemia, OSA, RBBB, recent COVID infection history of ectopic atrial rhythm, CKD III, hyperlipidemia, morbid obesity, DM2 , PVD, who was admitted 04/16/2022 for the evaluation of acute hypoxia with right sided heart failure at the request of Dr. Florene Glen.  Now with Streptococcus pyogenes bacteremia with unclear source and new onset of severe tricuspid regurgitation.  Assessment & Plan    New onset right-sided heart failure preserved EF with CKD stage III Severe tricuspid regurgitation  Started on treatment for heart failure and received Lasix then became hypotensive and had decline in renal function in the  emergency department. BNP 1000, chest x-ray with congestion but no edema. Since then diuretics and eplerinone have been withheld with some improvement of kidney  function, but no updated labs this morning. Previously was being managed on 40 mg of torsemide daily, eplerenone 25 mg daily, and potassium supplementation.  He is intolerant to SGLT2 inhibitors due to prior yeast infection.  He has been followed by Dr. Marlou Porch and sees him regularly.  He is supposed to be on 2 L of oxygen at home but is noncompliant at home. Unable to maximize GDMT due to previous contraindications and declining renal function.   -Auto-diuresing - creatinine stable, but volume overloaded. Signs of RV dysfunction on echo today - continue metoprolol 12.5 mg BID, aldactone 12.5 mg daily   Acute respiratory failure with hypoxia secondary to pulmonary embolism -continue  apixaban dosed for PE - neg for DVT 04/18/22 - off oxygen   Strep pyogenes bacteremia - TEE negative for vegetation today   CAD s/p CABG x 4 in 2005 Aortic atherosclerosis  HLD with LDL goal <70 - continue 81 mg aspirin and rosuvastatin 20mg  - denies any chest pain on this admission   HTN See above in the context of CHF management.   OSA - Now has refused CPAP again due to not having the correct mask  Hypomagnesemia -chronic, replete - would also start magnesium oxide 400 mg BID tonight  Hypokalemia - replete potassium (3.4 today).    He is doing well today on PO medications. Medical regimen has been optimized. We will sign off. Please call with any questions or concerns.   For questions or updates, please contact [place] Please consult [email or web address] for contact info under    Melida Quitter, MD  04/26/2022, 8:57 AM

## 2022-04-28 ENCOUNTER — Telehealth: Payer: Self-pay

## 2022-04-28 NOTE — Transitions of Care (Post Inpatient/ED Visit) (Signed)
   04/28/2022  Name: [name] [name] MRN: [id number] DOB: [birth year]  Today's TOC FU Call Status: Today's TOC FU Call Status:: Successful TOC FU Call Competed TOC FU Call Complete Date: 04/28/22  Transition Care Management Follow-up Telephone Call Date of Discharge: 04/26/22 Discharge Facility: [facility]) Type of Discharge: Inpatient Admission Primary Inpatient Discharge Diagnosis:: "acute right sided low back back pain w/out sciatica" How have you been since you were released from the hospital?: Better Any questions or concerns?: No  Items Reviewed: Did you receive and understand the discharge instructions provided?: Yes Medications obtained and verified?: Yes (Medications Reviewed) Any new allergies since your discharge?: No Dietary orders reviewed?: Yes Type of Diet Ordered:: low salt/low carb Do you have support at home?: Yes People in Home: spouse Name of Support/Comfort Primary Source: wife  Home Care and Equipment/Supplies: [place] Ordered?: Yes Name of [place]:: [facility] set up a time to come to your home?: No (Patient aware to follow up with agency if he does not hear from agency-provided with contact info) Any new equipment or medical supplies ordered?: Yes Name of Medical supply agency?: [employer] Were you able to get the equipment/medical supplies?: Yes (BSC, rollator-pt states he will discuss with [place] servcies to evlauate if he needs another size potty chair to fit in bathroom) Do you have any questions related to the use of the equipment/supplies?: Yes What questions do you have?:  (BSC, rollator-pt states he will discuss with HH servcies to evlauate if he needs another size potty chair to fit in bathroom)  Functional Questionnaire: Do you need assistance with bathing/showering or dressing?: No Do you need assistance with meal preparation?: No Do you need assistance with eating?: No Do you have difficulty maintaining  continence: No Do you need assistance with getting out of bed/getting out of a chair/moving?: No Do you have difficulty managing or taking your medications?: No  Folllow up appointments reviewed: PCP Follow-up appointment confirmed?: Yes Date of PCP follow-up appointment?: 05/02/22 Follow-up Provider: Dr. [name] Specialist [facility] Follow-up appointment confirmed?: Yes Date of Specialist follow-up appointment?: 05/13/22 Follow-Up Specialty Provider:: Dr. Gale Journey Do you need transportation to your follow-up appointment?: No Do you understand care options if your condition(s) worsen?: Yes-patient verbalized understanding  SDOH Interventions Today    Flowsheet Row Most Recent Value  SDOH Interventions   Food Insecurity Interventions Intervention Not Indicated  Transportation Interventions Intervention Not Indicated       TOC Interventions Today    Flowsheet Row Most Recent Value  TOC Interventions   TOC Interventions Discussed/Reviewed TOC Interventions Discussed, Post discharge activity limitations per provider, Arranged PCP follow up within 7 days/Care Guide scheduled      Interventions Today    Flowsheet Row Most Recent Value  General Interventions   General Interventions Discussed/Reviewed Communication with  Communication with RN  Education Interventions   Education Provided Provided Education  Provided Verbal Education On Nutrition, When to see the doctor, Other  Nutrition Interventions   Nutrition Discussed/Reviewed Nutrition Discussed, Adding fruits and vegetables, Carbohydrate meal planning, Decreasing sugar intake  Pharmacy Interventions   Pharmacy Dicussed/Reviewed Pharmacy Topics Discussed, Medications and their functions  Safety Interventions   Safety Discussed/Reviewed Safety Discussed, Fall Risk, Home Safety  Home Safety Assistive Devices        [place], [place] [facility] Care Management Care Management Community Coordinator Direct  Phone: [phone] Toll Free: [phone] Fax: [phone]

## 2022-04-28 NOTE — Transitions of Care (Post Inpatient/ED Visit) (Signed)
   04/28/2022  Name: [name] [name] MRN: [id number] DOB: Mar 10, 1955  Today's TOC FU Call Status: Today's TOC FU Call Status:: Unsuccessul Call (1st Attempt) Unsuccessful Call (1st Attempt) Date: 04/28/22  Attempted to reach the patient regarding the most recent Inpatient/ED visit.  Follow Up Plan: Additional outreach attempts will be made to reach the patient to complete the Transitions of Care (Post Inpatient/ED visit) call.     Enzo Montgomery, RN,BSN,CCM [facility] Care Management Care Management Community Coordinator Direct Phone: [phone] Toll Free: [phone] Fax: [phone]

## 2022-04-29 ENCOUNTER — Ambulatory Visit: Payer: Self-pay

## 2022-04-29 ENCOUNTER — Telehealth: Payer: Self-pay

## 2022-04-29 NOTE — Chronic Care Management (AMB) (Signed)
   04/29/2022  [name] Feb 01, 1956 [id number]   Reason for Encounter: CCM enrollment status changed to previously enrolled   Horris Latino RN Care Manager/Chronic Care Management [phone]

## 2022-04-29 NOTE — Patient Outreach (Signed)
  Care Coordination   Follow Up Visit Note   04/29/2022 Name: [name] [name] MRN: [id number] DOB: 01/21/1956  Voicemail message received from patient requesting return call. Return call placed to patient. He has some questions about his Metoprolol-wants to make sure he is taking it correctly. Reviewed dosage and instructions with patient. He confirmed he has pill splitter and is cutting med in half. Patient also voices that he has not heard form HH yet. He admits he has another phone now and not sure what number they may have tried to reach him at and has not checked voicemail. Provided patient with [facility] number([phone]). He will call agency today to arrange home visit.    SDOH assessments and interventions completed:  Yes     Care Coordination Interventions:  Yes, provided   Follow up plan:  Follow up appt already scheduled with assigned RN CM.    Encounter Outcome:  Pt. Visit Completed

## 2022-05-01 ENCOUNTER — Telehealth: Payer: Self-pay | Admitting: Family Medicine

## 2022-05-01 DIAGNOSIS — Z951 Presence of aortocoronary bypass graft: Secondary | ICD-10-CM | POA: Diagnosis not present

## 2022-05-01 DIAGNOSIS — N179 Acute kidney failure, unspecified: Secondary | ICD-10-CM | POA: Diagnosis not present

## 2022-05-01 DIAGNOSIS — E1122 Type 2 diabetes mellitus with diabetic chronic kidney disease: Secondary | ICD-10-CM | POA: Diagnosis not present

## 2022-05-01 DIAGNOSIS — Z7951 Long term (current) use of inhaled steroids: Secondary | ICD-10-CM | POA: Diagnosis not present

## 2022-05-01 DIAGNOSIS — N1831 Chronic kidney disease, stage 3a: Secondary | ICD-10-CM | POA: Diagnosis not present

## 2022-05-01 DIAGNOSIS — I2510 Atherosclerotic heart disease of native coronary artery without angina pectoris: Secondary | ICD-10-CM | POA: Diagnosis not present

## 2022-05-01 DIAGNOSIS — I50810 Right heart failure, unspecified: Secondary | ICD-10-CM | POA: Diagnosis not present

## 2022-05-01 DIAGNOSIS — I5032 Chronic diastolic (congestive) heart failure: Secondary | ICD-10-CM | POA: Diagnosis not present

## 2022-05-01 DIAGNOSIS — Z87891 Personal history of nicotine dependence: Secondary | ICD-10-CM | POA: Diagnosis not present

## 2022-05-01 DIAGNOSIS — G510 Bell's palsy: Secondary | ICD-10-CM | POA: Diagnosis not present

## 2022-05-01 DIAGNOSIS — I2699 Other pulmonary embolism without acute cor pulmonale: Secondary | ICD-10-CM | POA: Diagnosis not present

## 2022-05-01 DIAGNOSIS — Z7982 Long term (current) use of aspirin: Secondary | ICD-10-CM | POA: Diagnosis not present

## 2022-05-01 DIAGNOSIS — E876 Hypokalemia: Secondary | ICD-10-CM | POA: Diagnosis not present

## 2022-05-01 DIAGNOSIS — Z79899 Other long term (current) drug therapy: Secondary | ICD-10-CM | POA: Diagnosis not present

## 2022-05-01 DIAGNOSIS — E1142 Type 2 diabetes mellitus with diabetic polyneuropathy: Secondary | ICD-10-CM | POA: Diagnosis not present

## 2022-05-01 DIAGNOSIS — Z794 Long term (current) use of insulin: Secondary | ICD-10-CM | POA: Diagnosis not present

## 2022-05-01 DIAGNOSIS — Z8616 Personal history of COVID-19: Secondary | ICD-10-CM | POA: Diagnosis not present

## 2022-05-01 DIAGNOSIS — I083 Combined rheumatic disorders of mitral, aortic and tricuspid valves: Secondary | ICD-10-CM | POA: Diagnosis not present

## 2022-05-01 DIAGNOSIS — Z7901 Long term (current) use of anticoagulants: Secondary | ICD-10-CM | POA: Diagnosis not present

## 2022-05-01 DIAGNOSIS — I130 Hypertensive heart and chronic kidney disease with heart failure and stage 1 through stage 4 chronic kidney disease, or unspecified chronic kidney disease: Secondary | ICD-10-CM | POA: Diagnosis not present

## 2022-05-01 DIAGNOSIS — K7460 Unspecified cirrhosis of liver: Secondary | ICD-10-CM | POA: Diagnosis not present

## 2022-05-01 DIAGNOSIS — G4733 Obstructive sleep apnea (adult) (pediatric): Secondary | ICD-10-CM | POA: Diagnosis not present

## 2022-05-01 NOTE — Telephone Encounter (Signed)
OK verbal orders? Please advise 

## 2022-05-01 NOTE — Telephone Encounter (Signed)
Ok with me. Please place any necessary orders. 

## 2022-05-01 NOTE — Telephone Encounter (Signed)
Home Health Verbal Orders  Agency:  [place]: Kimberlee Nearing and title  Ph# [phone] - PT and RN Evaluation  Requesting PT: and Home Health Nurse Evaluation  Reason for Request: for PT-Functional Mobility and Preventing Falls  For Home Health Nurse Evaluation: Disease and Medication Management  Frequency-PT:  1 x per week for 3 weeks AND  Caller requests to be called regarding a number of concerns he wants to discuss  [place] needs F2F w/in last 30 days

## 2022-05-02 ENCOUNTER — Ambulatory Visit (INDEPENDENT_AMBULATORY_CARE_PROVIDER_SITE_OTHER): Payer: 59 | Admitting: Family Medicine

## 2022-05-02 ENCOUNTER — Encounter: Payer: Self-pay | Admitting: Family Medicine

## 2022-05-02 VITALS — BP 136/78 | HR 72 | Temp 98.4°F | Ht 71.0 in | Wt 295.0 lb

## 2022-05-02 DIAGNOSIS — Z794 Long term (current) use of insulin: Secondary | ICD-10-CM | POA: Diagnosis not present

## 2022-05-02 DIAGNOSIS — R7881 Bacteremia: Secondary | ICD-10-CM

## 2022-05-02 DIAGNOSIS — I50810 Right heart failure, unspecified: Secondary | ICD-10-CM | POA: Diagnosis not present

## 2022-05-02 DIAGNOSIS — E1169 Type 2 diabetes mellitus with other specified complication: Secondary | ICD-10-CM | POA: Diagnosis not present

## 2022-05-02 DIAGNOSIS — I10 Essential (primary) hypertension: Secondary | ICD-10-CM | POA: Diagnosis not present

## 2022-05-02 DIAGNOSIS — K089 Disorder of teeth and supporting structures, unspecified: Secondary | ICD-10-CM | POA: Diagnosis not present

## 2022-05-02 DIAGNOSIS — N179 Acute kidney failure, unspecified: Secondary | ICD-10-CM | POA: Diagnosis not present

## 2022-05-02 DIAGNOSIS — I2699 Other pulmonary embolism without acute cor pulmonale: Secondary | ICD-10-CM

## 2022-05-02 DIAGNOSIS — M1990 Unspecified osteoarthritis, unspecified site: Secondary | ICD-10-CM

## 2022-05-02 LAB — CBC
HCT: 38.1 % — ABNORMAL LOW (ref 39.0–52.0)
Hemoglobin: 12.7 g/dL — ABNORMAL LOW (ref 13.0–17.0)
MCHC: 33.4 g/dL (ref 30.0–36.0)
MCV: 103.3 fl — ABNORMAL HIGH (ref 78.0–100.0)
Platelets: 259 10*3/uL (ref 150.0–400.0)
RBC: 3.69 Mil/uL — ABNORMAL LOW (ref 4.22–5.81)
RDW: 16.7 % — ABNORMAL HIGH (ref 11.5–15.5)
WBC: 5.1 10*3/uL (ref 4.0–10.5)

## 2022-05-02 LAB — COMPREHENSIVE METABOLIC PANEL
ALT: 36 U/L (ref 0–53)
AST: 51 U/L — ABNORMAL HIGH (ref 0–37)
Albumin: 3.2 g/dL — ABNORMAL LOW (ref 3.5–5.2)
Alkaline Phosphatase: 230 U/L — ABNORMAL HIGH (ref 39–117)
BUN: 12 mg/dL (ref 6–23)
CO2: 26 mEq/L (ref 19–32)
Calcium: 9.3 mg/dL (ref 8.4–10.5)
Chloride: 104 mEq/L (ref 96–112)
Creatinine, Ser: 1.06 mg/dL (ref 0.40–1.50)
GFR: 72.22 mL/min (ref >60.00)
Glucose, Bld: 140 mg/dL — ABNORMAL HIGH (ref 70–99)
Potassium: 4.1 mEq/L (ref 3.5–5.1)
Sodium: 137 mEq/L (ref 135–145)
Total Bilirubin: 0.8 mg/dL (ref 0.2–1.2)
Total Protein: 8.8 g/dL — ABNORMAL HIGH (ref 6.0–8.3)

## 2022-05-02 MED ORDER — DICLOFENAC SODIUM 2 % EX SOLN: 2.0000 | Freq: Two times a day (BID) | CUTANEOUS | 5 refills | Status: DC | PRN

## 2022-05-02 MED ORDER — APIXABAN 5 MG PO TABS: 5.0000 mg | ORAL_TABLET | Freq: Two times a day (BID) | ORAL | 1 refills | Status: DC

## 2022-05-02 NOTE — Assessment & Plan Note (Signed)
Blood pressure at goal on current regimen spironolactone 25 mg daily, Metroprolol tartrate 12.5 mg twice daily.

## 2022-05-02 NOTE — Telephone Encounter (Signed)
OK verbal orders given  

## 2022-05-02 NOTE — Patient Instructions (Addendum)
It was very nice to see you today!  I am glad you are feeling better.  We will check blood work today.  You need to be on a blood thinner for 3 months today. Please start the eliquis 5mg  twice daily after your current prescription. Take this for 2 months.   Please come back in a couple of months. Come back sooner if needed.   Take care, Dr [name]  PLEASE NOTE:  If you had any lab tests, please let us know if you have not heard back within a few days. You may see your results on mychart before we have a chance to review them but we will give you a call once they are reviewed by [place].   If we ordered any referrals today, please let us know if you have not heard from their office within the next week.   If you had any urgent prescriptions sent in today, please check with the pharmacy within an hour of our visit to make sure the prescription was transmitted appropriately.   Please try these tips to maintain a healthy lifestyle:  Eat at least 3 REAL meals and 1-2 snacks per day.  Aim for no more than 5 hours between eating.  If you eat breakfast, please do so within one hour of getting up.   Each meal should contain half fruits/vegetables, one quarter protein, and one quarter carbs (no bigger than a computer mouse)  Cut down on sweet beverages. This includes juice, soda, and sweet tea.   Drink at least 1 glass of water with each meal and aim for at least 8 glasses per day  Exercise at least 150 minutes every week.

## 2022-05-02 NOTE — Progress Notes (Signed)
Chief Complaint:  [name] [name] is a 67 y.o. male who presents today for a TCM visit.  Assessment/Plan:  New/Acute Problems: Strep pyogenes bacteremia No signs of recurrence. He will finish his course of amoxicillin. Has ID follow up in a couple of weeks.  We discussed reasons to return to care  AKI Resolved prior to discharge.  Likely secondary to bacteremia and right heart failure.  We will check CMET today.   Poor Dentition  Likely the source of his above bacteremia. Needs dental work done. Will place referral to dentistry.  He will continue amoxicillin as above.  Chronic Problems Addressed Today: Pulmonary embolism ([place]) Likely precipitated by recent COVID and strep bacteremia and hospitalization.  He is currently on Eliquis and has already transition to 5 mg twice daily.  We will complete for 2-month course.  Check CBC today.  RVF (right ventricular failure) ([place]) Has follow up with cardiology planned. no signs of volume overload.  Will continue current regimen per cardiology.  HTN (hypertension) Blood pressure at goal on current regimen spironolactone 25 mg daily, Metroprolol tartrate 12.5 mg twice daily.   Type 2 diabetes mellitus with other specified complication ([place]), followed by Dr. Jamey Ripa with endocrinology for this.  He is on Lantus 20 units daily and sliding scale insulin with Humalog.  Osteoarthritis Follows with sports medicine. We refilled topical diclofenac.  He can also use Tylenol as needed.  Gave DME order for toilet seat today.    Subjective:  HPI:  Summary of Hospital admission: Reason for admission: Bacteremia, fluid overload, PE Date of admission: 04/16/2022 Date of discharge: 04/26/2022 Date of Interactive contact: 05/08/2022 Summary of Hospital course: Patient presented to the ED with low back pain.  Was found to be febrile with new oxygen requirement.  Workup in the ED was significant for volume overload and elevated BNP.  He was admitted  for further management.  He was started on IV diuresis.  Became hypotensive in the ED.  Blood cultures were obtained which ended up growing strep pyogenes.  Had a VQ scan which showed pulmonary embolism.  Was initially started on heparin but then transition to Eliquis.  Cardiology was consulted.  Echocardiogram showed severe right-sided heart failure.  Discharged home on Aldactone and Metroprolol tartrate.  His strep pyogenes was treated with IV penicillin.  Echocardiogram did not show any vegetations and he was not suspected to have endocarditis.  ID followed.  Repeat cultures were clear.  He was transition to oral amoxicillin for 7 additional days.  Interim history:  He has been doing reasonable well since being home. No fevers or chills. Still having some back pain. He has been compliant with his medications without side effects. He does have upcoming appointment with ID. He has a few more days of antibiotics left.   ROS: Per HPI, otherwise a complete review of systems was negative.   PMH:  The following were reviewed and entered/updated in epic: Past Medical History:  Diagnosis Date   Acute myocardial infarction, unspecified site, episode of care unspecified 2005   Acute pancreatitis    CAD (coronary artery disease)    a. CABG in 2005 w LIMA to LAD, left radial to second circumflex marginal, saphenous vein graft to PDA, saphenous vein graft to lateral subbrach of ramus intermediate, and sequential saphenous vein graft to the medial subbranch of ramus intermediate.   Chronic diastolic CHF (congestive heart failure) (HCC)    Cirrhosis of liver without mention of alcohol  CKD (chronic kidney disease), stage II    Complications affecting other specified body systems, hypertension    Ectopic atrial rhythm    Essential hypertension    Hyperlipidemia    Hypokalemia    a. intermitttent noncompliance with potassium supplement.   Hypomagnesemia    Morbid obesity ([place])    Neuropathy    Other  and unspecified hyperlipidemia    Proteinuria    RBBB    Sleep apnea    Type II or unspecified type diabetes mellitus without mention of complication, not stated as uncontrolled    Varicose veins of both lower extremities    Patient Active Problem List   Diagnosis Date Noted   Severe tricuspid regurgitation 04/19/2022   Pulmonary embolism ([place]) 04/19/2022   RVF (right ventricular failure) ([place]) 04/19/2022   Decreased hearing 04/15/2022   Preoperative clearance 08/27/2021   Ectopic atrial tachycardia 02/12/2021   Simple chronic bronchitis ([place]) 12/17/2020   PND (post-nasal drip) 12/17/2020   Cough 10/31/2019   Intertrigo 10/31/2019   Coronary artery disease of bypass graft of native heart with stable angina pectoris ([place])    Type 2 diabetes mellitus with other specified complication ([place]), followed by Dr. Buddy Duty 10/14/2018   Chronic heart failure with preserved ejection fraction ([place]) 08/29/2017   Varicose vein of leg 04/13/2017   Polyneuropathy associated with underlying disease ([place])    Osteoarthritis 01/21/2017   Seasonal and perennial allergic rhinitis 07/17/2016   Ulnar neuritis, left 03/27/2016   PVD (peripheral vascular disease) ([place]) 04/30/2009   Morbid obesity ([place])    HLD (hyperlipidemia) 01/04/2007   Hepatic cirrhosis ([place]) 01/04/2007   Obstructive sleep apnea 01/04/2007   HTN (hypertension) 01/04/2007   Past Surgical History:  Procedure Laterality Date   CORONARY ARTERY BYPASS GRAFT     2005   I & D EXTREMITY Left 09/02/2019   Procedure: IRRIGATION AND DEBRIDEMENT 4th TOE DEBRIDIMENT;  Surgeon: Evelina Bucy, DPM;  Location: [facility] ORS;  Service: Podiatry;  Laterality: Left;   TEE WITHOUT CARDIOVERSION N/A 04/25/2022   Procedure: TRANSESOPHAGEAL ECHOCARDIOGRAM (TEE);  Surgeon: Lelon Perla, MD;  Location: [facility] ENDOSCOPY;  Service: Cardiovascular;  Laterality: N/A;    Family History  Problem Relation Age of Onset   Breast cancer Mother    Prostate cancer Father     Coronary artery disease Father        underwent CABG    Medications- Reconciled discharge and current medications in Epic.  Current Outpatient Medications  Medication Sig Dispense Refill   amoxicillin (AMOXIL) 500 MG capsule Take 2 capsules (1,000 mg total) by mouth every 8 (eight) hours for 7 days. 42 capsule 0   apixaban (ELIQUIS) 5 MG TABS tablet Take 1 tablet (5 mg total) by mouth 2 (two) times daily. 60 tablet 1   APIXABAN (ELIQUIS) VTE STARTER PACK (10MG  AND 5MG ) Take as directed on package: start with two-5mg  tablets twice daily for 7 days. On day 8, switch to one-5mg  tablet twice daily. 74 each 0   aspirin EC 81 MG tablet Take 81 mg by mouth daily.     B-D ULTRAFINE III SHORT PEN 31G X 8 MM MISC   1   diclofenac Sodium (PENNSAID) 2 % SOLN Apply 2 Pump (40 mg total) topically 2 (two) times daily as needed. 112 g 5   famotidine (PEPCID) 20 MG tablet TAKE 1 TABLET(20 MG) BY MOUTH DAILY (Patient taking differently: Take 20 mg by mouth daily as needed for heartburn.) 90 tablet 1   fluticasone (  FLONASE) 50 MCG/ACT nasal spray Place 1 spray into both nostrils daily. 16 g 12   Fluticasone-Umeclidin-Vilant (TRELEGY ELLIPTA) 100-62.5-25 MCG/ACT AEPB Inhale 1 puff into the lungs daily. 180 each 3   HUMALOG 100 UNIT/ML injection Inject into the skin See admin instructions. Per SSI     hydroxypropyl methylcellulose / hypromellose (ISOPTO TEARS / GONIOVISC) 2.5 % ophthalmic solution Place 2 drops into both eyes as needed for dry eyes. 15 mL 12   LANTUS SOLOSTAR 100 UNIT/ML Solostar Pen Inject 20 Units into the skin every morning. 15 mL 11   lidocaine (LIDODERM) 5 % Place 1 patch onto the skin daily. Remove & Discard patch within 12 hours or as directed by MD 5 patch 0   MAGNESIUM-OXIDE 400 (241.3 Mg) MG tablet Take 1 tablet by mouth daily.     metoprolol tartrate (LOPRESSOR) 25 MG tablet Take 0.5 tablets (12.5 mg total) by mouth 2 (two) times daily. 30 tablet 0   nitroGLYCERIN (NITROSTAT) 0.4 MG  SL tablet Place 1 tablet (0.4 mg total) under the tongue every 5 (five) minutes as needed for chest pain. 25 tablet 3   ONETOUCH ULTRA test strip TEST AS DIRECTED 8 TIMES DAILY 800 strip 0   polyethylene glycol (MIRALAX / GLYCOLAX) 17 g packet Take 17 g by mouth daily. 14 each 0   PROAIR HFA 108 (90 Base) MCG/ACT inhaler Inhale 2 puffs into the lungs every 6 (six) hours as needed for wheezing or shortness of breath. 8.5 g 5   rosuvastatin (CRESTOR) 20 MG tablet Take 1 tablet (20 mg total) by mouth every evening. 30 tablet 0   silver sulfADIAZINE (SSD) 1 % cream APPLY PEA SIZED AMOUNT TOPICALLY TO WOUND DAILY 50 g 0   spironolactone (ALDACTONE) 25 MG tablet Take 0.5 tablets (12.5 mg total) by mouth daily. 15 tablet 0   No current facility-administered medications for this visit.    Allergies-reviewed and updated Allergies  Allergen Reactions   Empagliflozin Rash    Patient reports yeast.    Ibuprofen Other (See Comments)    Stomach pain    Social History   Socioeconomic History   Marital status: Significant Other    Spouse name: Not on file   Number of children: Not on file   Years of education: Not on file   Highest education level: Not on file  Occupational History   Occupation: Disabled     Comment: previously worked in delivery   Tobacco Use   Smoking status: Former    Packs/day: 1.50    Years: 30.00    Additional pack years: 0.00    Total pack years: 45.00    Types: Cigarettes    Quit date: 10/20/2004    Years since quitting: 17.5   Smokeless tobacco: Never  Vaping Use   Vaping Use: Never used  Substance and Sexual Activity   Alcohol use: Yes    Comment: occasional   Drug use: No   Sexual activity: Not on file  Other Topics Concern   Not on file  Social History Narrative   Lives with significant other x 30+ years, apartment "friends smoke". Disability. Is disabled secondary to his work-related injury.       Has over 20 grandchildren    Social Determinants of  Health   Financial Resource Strain: Low Risk  (11/28/2021)   Overall Financial Resource Strain (CARDIA)    Difficulty of Paying Living Expenses: Not hard at all  Food Insecurity: No Food Insecurity (04/28/2022)  Hunger Vital Sign    Worried About Running Out of Food in the Last Year: Never true    Ran Out of Food in the Last Year: Never true  Transportation Needs: No Transportation Needs (04/28/2022)   PRAPARE - Hydrologist (Medical): No    Lack of Transportation (Non-Medical): No  Physical Activity: Sufficiently Active (11/28/2021)   Exercise Vital Sign    Days of Exercise per Week: 5 days    Minutes of Exercise per Session: 30 min  Stress: No Stress Concern Present (11/28/2021)   [place]    Feeling of Stress : Not at all  Social Connections: Socially Isolated (11/28/2021)   Social Connection and Isolation Panel [NHANES]    Frequency of Communication with Friends and Family: More than three times a week    Frequency of Social Gatherings with Friends and Family: More than three times a week    Attends Religious Services: Never    Marine scientist or Organizations: No    Attends Music therapist: Never    Marital Status: Never married        Objective:  Physical Exam: BP 136/78   Pulse 72   Temp 98.4 F (36.9 C)   Ht 5\' 11"  (1.803 m)   Wt 295 lb (133.8 kg)   SpO2 96%   BMI 41.14 kg/m   Wt Readings from Last 3 Encounters:  05/02/22 295 lb (133.8 kg)  04/26/22 (!) 303 lb 5.7 oz (137.6 kg)  04/15/22 (!) 304 lb 9.6 oz (138.2 kg)  Gen: NAD, resting comfortably CV: RRR with no murmurs appreciated Pulm: NWOB, CTAB with no crackles, wheezes, or rhonchi GI: Normal bowel sounds present. Soft, Nontender, Nondistended. MSK: No edema, cyanosis, or clubbing noted Skin: Warm, dry Neuro: Grossly normal, moves all extremities Psych: Normal affect and thought  content  Time Spent: 50 minutes of total time was spent on the date of the encounter performing the following actions: chart review prior to seeing the patient including recent hospitalization, obtaining history, performing a medically necessary exam, counseling on the treatment plan, placing orders, and documenting in our EHR.        Algis Greenhouse. [name], MD 05/02/2022 1:03 PM

## 2022-05-02 NOTE — Assessment & Plan Note (Signed)
Likely precipitated by recent COVID and strep bacteremia and hospitalization.  He is currently on Eliquis and has already transition to 5 mg twice daily.  We will complete for 101-month course.  Check CBC today.

## 2022-05-02 NOTE — Assessment & Plan Note (Signed)
Follows with sports medicine. We refilled topical diclofenac.  He can also use Tylenol as needed.  Gave DME order for toilet seat today.

## 2022-05-02 NOTE — Assessment & Plan Note (Signed)
Follows with endocrinology for this.  He is on Lantus 20 units daily and sliding scale insulin with Humalog.

## 2022-05-02 NOTE — Assessment & Plan Note (Signed)
Has follow up with cardiology planned. no signs of volume overload.  Will continue current regimen per cardiology.

## 2022-05-05 ENCOUNTER — Ambulatory Visit: Payer: Self-pay

## 2022-05-05 ENCOUNTER — Telehealth: Payer: Self-pay

## 2022-05-05 DIAGNOSIS — E1169 Type 2 diabetes mellitus with other specified complication: Secondary | ICD-10-CM

## 2022-05-05 DIAGNOSIS — I5032 Chronic diastolic (congestive) heart failure: Secondary | ICD-10-CM

## 2022-05-05 NOTE — Patient Instructions (Signed)
Visit Information  Thank you for taking time to visit with me today. Please don't hesitate to contact me if I can be of assistance to you.   Following are the goals we discussed today:   Goals Addressed             This Visit's Progress    Heart Failure and Diabetes Management       Patient Goals/Self Care Activities: -Patient/Caregiver will self-administer medications as prescribed as evidenced by self-report/primary caregiver report  -Patient/Caregiver will attend all scheduled provider appointments as evidenced by clinician review of documented attendance to scheduled appointments and patient/caregiver report -Patient/Caregiver will call provider office for new concerns or questions as evidenced by review of documented incoming telephone call notes and patient report  -wear comfortable, well-fitting shoes -Weigh daily and record (notify MD with 3 lb weight gain over night or 5 lb in a week) -Follow CHF Action Plan -Adhere to low sodium diet   Interventions Today    Flowsheet Row Most Recent Value  Chronic Disease   Chronic disease during today's visit Congestive Heart Failure (CHF), Diabetes  General Interventions   General Interventions Discussed/Reviewed Doctor Visits  Doctor Visits Discussed/Reviewed Doctor Visits Discussed  Communication with PCP/Specialists  Exercise Interventions   Exercise Discussed/Reviewed Exercise Discussed  Education Interventions   Education Provided Provided Education  Provided Verbal Education On Blood Sugar Monitoring, Nutrition  [weights]  Mental Health Interventions   Mental Health Discussed/Reviewed Depression, Mental Health Discussed  Nutrition Interventions   Nutrition Discussed/Reviewed Nutrition Discussed, Decreasing salt  Pharmacy Interventions   Pharmacy Dicussed/Reviewed Pharmacy Topics Discussed  Safety Interventions   Safety Discussed/Reviewed Safety Discussed, Fall Risk  Home Safety Assistive Devices               Our next appointment is by telephone on 05/14/22 at 0930   Please call the care guide team at [phone] if you need to cancel or reschedule your appointment.   If you are experiencing a Mental Health or [place] or need someone to talk to, please call the Suicide and Crisis Lifeline: 988   Patient verbalizes understanding of instructions and care plan provided today and agrees to view in [place]. Active MyChart status and patient understanding of how to access instructions and care plan via MyChart confirmed with patient.     The patient has been provided with contact information for the care management team and has been advised to call with any health related questions or concerns.  Jone Baseman, RN, MSN [place] Management Care Management Coordinator Direct Line [phone]

## 2022-05-05 NOTE — Patient Outreach (Signed)
  Care Coordination   05/05/2022 Name: [name] [name] MRN: [id number] DOB: [birth year]   Care Coordination Outreach Attempts:  An unsuccessful telephone outreach was attempted today to offer the patient information about available care coordination services as a benefit of their health plan.   Follow Up Plan:  Additional outreach attempts will be made to offer the patient care coordination information and services.   Encounter Outcome:  No Answer   Care Coordination Interventions:  No, not indicated    Jone Baseman, RN, MSN [place] Management Care Management Coordinator Direct Line [phone]

## 2022-05-05 NOTE — Patient Outreach (Signed)
  Care Coordination   Initial Visit Note   05/05/2022 Name: [name] [name] MRN: [id number] DOB: 17-Nov-1955  [name] [name] is a 67 y.o. year old male who sees [name], MD for primary care. I spoke with  [name] by phone today.  What matters to the patients health and wellness today?  Getting better    Goals Addressed             This Visit's Progress    Heart Failure and Diabetes Management       Patient Goals/Self Care Activities: -Patient/Caregiver will self-administer medications as prescribed as evidenced by self-report/primary caregiver report  -Patient/Caregiver will attend all scheduled provider appointments as evidenced by clinician review of documented attendance to scheduled appointments and patient/caregiver report -Patient/Caregiver will call provider office for new concerns or questions as evidenced by review of documented incoming telephone call notes and patient report  -wear comfortable, well-fitting shoes -Weigh daily and record (notify MD with 3 lb weight gain over night or 5 lb in a week) -Follow CHF Action Plan -Adhere to low sodium diet   Interventions Today    Flowsheet Row Most Recent Value  Chronic Disease   Chronic disease during today's visit Congestive Heart Failure (CHF), Diabetes  General Interventions   General Interventions Discussed/Reviewed Doctor Visits  Doctor Visits Discussed/Reviewed Doctor Visits Discussed  Communication with PCP/Specialists  Exercise Interventions   Exercise Discussed/Reviewed Exercise Discussed  Education Interventions   Education Provided Provided Education  Provided Verbal Education On Blood Sugar Monitoring, Nutrition  [weights]  Mental Health Interventions   Mental Health Discussed/Reviewed Depression, Mental Health Discussed  Nutrition Interventions   Nutrition Discussed/Reviewed Nutrition Discussed, Decreasing salt  Pharmacy Interventions   Pharmacy Dicussed/Reviewed Pharmacy Topics Discussed   Safety Interventions   Safety Discussed/Reviewed Safety Discussed, Fall Risk  Home Safety Assistive Devices              SDOH assessments and interventions completed:  Yes  SDOH Interventions Today    Flowsheet Row Most Recent Value  SDOH Interventions   Housing Interventions Intervention Not Indicated        Care Coordination Interventions:  Yes, provided   Follow up plan: Follow up call scheduled for 05/14/22    Encounter Outcome:  Pt. Visit Completed   Jone Baseman, RN, MSN [place] Management Care Management Coordinator Direct Line [phone]

## 2022-05-06 ENCOUNTER — Telehealth: Payer: Self-pay

## 2022-05-06 ENCOUNTER — Other Ambulatory Visit: Payer: Self-pay | Admitting: Family Medicine

## 2022-05-06 NOTE — Patient Outreach (Signed)
  Care Coordination   Follow Up Visit Note   05/06/2022 Name: [name] [name] MRN: [id number] DOB: 10/05/1955  [name] [name] is a 66 y.o. year old male who sees [name], MD for primary care. I     Incoming call from patient. He states he talked with someone about possibly getting set up with meal delivery from [facility] but can't remember who. RN CM contacted patient's assigned nurse([name]) and verified that she spoke with patient regarding it. Referral was placed by RN to care guide to assist patient with obtaining meals. Patient made aware of this and that someone should be contacting him within the next few days. He is aware to follow back up if he does not hear form anyone. Patient also inquiring about compression stockings. Patient confirms he has OTC benefit with [employer]. He will contact company to see about ordering them through [facility]. Denies any further RN CM needs or concerns at this time.   Care Coordination Interventions:  Yes, provided   Interventions Today    Flowsheet Row Most Recent Value  General Interventions   General Interventions Discussed/Reviewed Communication with  [name] RN CM]  Communication with RN  Education Interventions   Education Provided Provided Education  Provided Verbal Education On Other  [facility] benefits]  Safety Interventions   Safety Discussed/Reviewed Safety Reviewed  [pt states PCP ordered bariatric commode seat for him-should be delivered to him soon]        Follow up plan:  Assigned RN CM will follow up with patient.    Encounter Outcome:  Pt. Visit Completed   [facility] Care Management Care Management Community Coordinator Direct Phone: [phone] Toll Free: [phone] Fax: [phone]

## 2022-05-06 NOTE — Telephone Encounter (Signed)
   Telephone encounter was:  Unsuccessful.  05/06/2022 Name: [name] [name] MRN: [id number] DOB: 06-01-1955  Unsuccessful outbound call made today to assist with:  Food Insecurity  Outreach Attempt:  1st Attempt  A HIPAA compliant voice message was left requesting a return call.  Instructed patient to call back.    [facility] [place] [phone] [address], [place], [place] [address] Phone: [phone] Email: [email or web address]

## 2022-05-06 NOTE — Progress Notes (Signed)
Please inform patient of the following:  His blood counts are very slightly down and some of his liver numbers are slightly up  Please have him come back this week to recheck CBC, CMET, and GGT.

## 2022-05-07 ENCOUNTER — Telehealth: Payer: Self-pay

## 2022-05-07 ENCOUNTER — Other Ambulatory Visit: Payer: Self-pay | Admitting: *Deleted

## 2022-05-07 ENCOUNTER — Telehealth: Payer: Self-pay | Admitting: *Deleted

## 2022-05-07 DIAGNOSIS — R748 Abnormal levels of other serum enzymes: Secondary | ICD-10-CM

## 2022-05-07 NOTE — Telephone Encounter (Signed)
Patient requesting meditation change  Stated he preferred Rx Diclofenac sodium 1% gel instead of diclofenac Sodium (PENNSAID) 2 % SOLN Please advise

## 2022-05-07 NOTE — Telephone Encounter (Signed)
   Telephone encounter was:  Unsuccessful.  05/07/2022 Name: MARTRAVIOUS FRYSON MRN: [id number] DOB: 11-01-1955  Unsuccessful outbound call made today to assist with:  Food Insecurity  Outreach Attempt:  2nd Attempt  A HIPAA compliant voice message was left requesting a return call.  Instructed patient to call back .   [place] [phone] [address], [place], [place] [address] Phone: [phone] Email: [email or web address]

## 2022-05-07 NOTE — Patient Outreach (Signed)
  Care Coordination   Follow Up Visit Note   05/07/2022 Name: [name] [name] MRN: [id number] DOB: [birth year]   Incoming call from patient. He states he wants to change the dosage from 2% to 1% of his Diclofenac . Advised patient that he needs to contact provider office to discuss request. He voiced understanding and will do so. Patient voices that "lady called him yesterday regarding meal setup via insurance." He states it will take about 4-5 days for meals to be delivered. Patient voices therapist is supposed to come today for visit but he has not heard from them yet regarding time. RN CM confirmed patient has contact info and he will call to see what time therapist coming out. No other RN CM needs or concerns at this time.        Care Coordination Interventions:  Yes, provided   Follow up plan:  Assigned RN CM will follow up with patient as previously scheduled.    Encounter Outcome:  Pt. Visit Completed   [facility] Care Management Care Management Community Coordinator Direct Phone: [phone] Toll Free: [phone] Fax: [phone]

## 2022-05-08 ENCOUNTER — Telehealth: Payer: Self-pay

## 2022-05-08 ENCOUNTER — Other Ambulatory Visit: Payer: Self-pay | Admitting: Cardiology

## 2022-05-08 ENCOUNTER — Other Ambulatory Visit: Payer: Self-pay | Admitting: Family Medicine

## 2022-05-08 ENCOUNTER — Telehealth: Payer: Self-pay | Admitting: Family Medicine

## 2022-05-08 DIAGNOSIS — E1122 Type 2 diabetes mellitus with diabetic chronic kidney disease: Secondary | ICD-10-CM | POA: Diagnosis not present

## 2022-05-08 DIAGNOSIS — I083 Combined rheumatic disorders of mitral, aortic and tricuspid valves: Secondary | ICD-10-CM | POA: Diagnosis not present

## 2022-05-08 DIAGNOSIS — Z7901 Long term (current) use of anticoagulants: Secondary | ICD-10-CM | POA: Diagnosis not present

## 2022-05-08 DIAGNOSIS — Z7982 Long term (current) use of aspirin: Secondary | ICD-10-CM | POA: Diagnosis not present

## 2022-05-08 DIAGNOSIS — Z951 Presence of aortocoronary bypass graft: Secondary | ICD-10-CM | POA: Diagnosis not present

## 2022-05-08 DIAGNOSIS — E1142 Type 2 diabetes mellitus with diabetic polyneuropathy: Secondary | ICD-10-CM | POA: Diagnosis not present

## 2022-05-08 DIAGNOSIS — Z79899 Other long term (current) drug therapy: Secondary | ICD-10-CM | POA: Diagnosis not present

## 2022-05-08 DIAGNOSIS — G4733 Obstructive sleep apnea (adult) (pediatric): Secondary | ICD-10-CM | POA: Diagnosis not present

## 2022-05-08 DIAGNOSIS — I130 Hypertensive heart and chronic kidney disease with heart failure and stage 1 through stage 4 chronic kidney disease, or unspecified chronic kidney disease: Secondary | ICD-10-CM | POA: Diagnosis not present

## 2022-05-08 DIAGNOSIS — N179 Acute kidney failure, unspecified: Secondary | ICD-10-CM | POA: Diagnosis not present

## 2022-05-08 DIAGNOSIS — G510 Bell's palsy: Secondary | ICD-10-CM | POA: Diagnosis not present

## 2022-05-08 DIAGNOSIS — K7460 Unspecified cirrhosis of liver: Secondary | ICD-10-CM | POA: Diagnosis not present

## 2022-05-08 DIAGNOSIS — Z87891 Personal history of nicotine dependence: Secondary | ICD-10-CM | POA: Diagnosis not present

## 2022-05-08 DIAGNOSIS — I50810 Right heart failure, unspecified: Secondary | ICD-10-CM | POA: Diagnosis not present

## 2022-05-08 DIAGNOSIS — I2510 Atherosclerotic heart disease of native coronary artery without angina pectoris: Secondary | ICD-10-CM | POA: Diagnosis not present

## 2022-05-08 DIAGNOSIS — Z7951 Long term (current) use of inhaled steroids: Secondary | ICD-10-CM | POA: Diagnosis not present

## 2022-05-08 DIAGNOSIS — Z8616 Personal history of COVID-19: Secondary | ICD-10-CM | POA: Diagnosis not present

## 2022-05-08 DIAGNOSIS — E876 Hypokalemia: Secondary | ICD-10-CM | POA: Diagnosis not present

## 2022-05-08 DIAGNOSIS — N1831 Chronic kidney disease, stage 3a: Secondary | ICD-10-CM | POA: Diagnosis not present

## 2022-05-08 DIAGNOSIS — Z794 Long term (current) use of insulin: Secondary | ICD-10-CM | POA: Diagnosis not present

## 2022-05-08 DIAGNOSIS — I2699 Other pulmonary embolism without acute cor pulmonale: Secondary | ICD-10-CM | POA: Diagnosis not present

## 2022-05-08 DIAGNOSIS — I5032 Chronic diastolic (congestive) heart failure: Secondary | ICD-10-CM | POA: Diagnosis not present

## 2022-05-08 NOTE — Telephone Encounter (Signed)
   Telephone encounter was:  Unsuccessful.  05/08/2022 Name: [name] [name] MRN: [id number] DOB: 09/10/1955  Unsuccessful outbound call made today to assist with:  Food Insecurity  Outreach Attempt:  3rd Attempt.  Referral closed unable to contact patient.  A HIPAA compliant voice message was left requesting a return call.  Instructed patient to call back .   [place] [phone] [address], [place], [place] [address] Phone: [phone] Email: [email or web address]

## 2022-05-08 NOTE — Telephone Encounter (Signed)
Ok with me. Please place any necessary orders. 

## 2022-05-08 NOTE — Telephone Encounter (Signed)
Pt states he was Rxed the wrong prescription.  Patient requesting meditation change  Stated he preferred Rx Diclofenac sodium 1% gel instead of diclofenac Sodium (PENNSAID) 2 % SOLN Please advise

## 2022-05-08 NOTE — Patient Outreach (Signed)
  Care Coordination   Follow Up Visit Note   05/08/2022 Name: KINARD FATIMA MRN: [id number] DOB: Dec 03, 1955  Incoming call from patient. Patient wanting to know if he can go gt his blood work done at another lab that is closer to his house instead of going to provider office. Advised patient that he would have to contact MD office to discuss this. He voices that his [facility] nurse is in the home and assisting him with meds. Spoke with both patient and nurse. Patient voices that he has been taking both Aldactone and Torsemide since discharge. He states he forgot that Torsemide was stopped despite having reviewed his meds multiple times since discharge with nurse and provider. Nurse removed med from patient daily meds bag. No other RN CM needs at this time.     Care Coordination Interventions:  Yes, provided   Follow up plan:  Assigned RN CM will follow up with patient as scheduled next week.    Encounter Outcome:  Pt. Visit Completed   Hetty Blend [facility] Care Management Care Management Community Coordinator Direct Phone: [phone] Toll Free: [phone] Fax: [phone]

## 2022-05-08 NOTE — Telephone Encounter (Signed)
error 

## 2022-05-12 ENCOUNTER — Telehealth: Payer: Self-pay | Admitting: Family Medicine

## 2022-05-12 ENCOUNTER — Telehealth: Payer: Self-pay

## 2022-05-12 NOTE — Telephone Encounter (Signed)
  Encourage patient to contact the pharmacy for refills or they can request refills through [place]:  Please schedule appointment if longer than 1 year  NEXT APPOINTMENT DATE:  MEDICATION:  metoprolol tartrate (LOPRESSOR) 25 MG tablet   spironolactone (ALDACTONE) 25 MG tablet   Is the patient out of medication? Almost  PHARMACY:  [facility] [id number] - [place], [place] AT [address] Phone: [phone]  Fax: [phone]      Let patient know to contact pharmacy at the end of the day to make sure medication is ready.  Please notify patient to allow 48-72 hours to process

## 2022-05-12 NOTE — Patient Outreach (Signed)
  Care Coordination   Follow Up Visit Note   05/12/2022 Name: [name] [name] MRN: [id number] DOB: 09/01/1955  Incoming call from patient. States he had a good weekend-was able to get outside and walk around a little and enjoy the nice weather. Patient inquiring about rather or not he will be able to have his cataracts surgery since he is on blood thinner now. Advised patient hat he would need to discuss this with eye MD at next appt and alert them of blood thinner usage and they will advise him of any special instructions  for both pre and post-op. He voiced understanding and will discuss with eye MD. He also states that he has a podiatrist appt coming up soon and will alert MD that he is on blood thinner as well. Denies any other RN CM needs or concerns at this time.       Care Coordination Interventions:  Yes, provided   Follow up plan:  Patient has follow up RN CM call with assigned nurse this week and advised patient of that. He voiced understanding and appreciation.     Encounter Outcome:  Pt. Visit Completed   Hetty Blend [facility] Care Management Care Management Community Coordinator Direct Phone: [phone] Toll Free: [phone] Fax: [phone]

## 2022-05-13 ENCOUNTER — Other Ambulatory Visit: Payer: Self-pay | Admitting: *Deleted

## 2022-05-13 ENCOUNTER — Inpatient Hospital Stay: Payer: 59 | Admitting: Internal Medicine

## 2022-05-13 ENCOUNTER — Telehealth: Payer: Self-pay | Admitting: Family Medicine

## 2022-05-13 MED ORDER — DICLOFENAC SODIUM 1 % EX GEL: CUTANEOUS | 2 refills | Status: AC

## 2022-05-13 NOTE — Telephone Encounter (Signed)
Rx send to [facility]

## 2022-05-13 NOTE — Telephone Encounter (Signed)
Type of form received: Home Health certification and plan of care   Additional comments:   Received by: Havlyn Ratchford  Form should be Faxed to: [phone]  Form should be mailed to:  N/A  Is patient requesting call for pickup: N/A   Form placed:  In provider's box  Attach charge sheet.   Individual made aware of 3-5 business day turn around (Y/N)? N/A

## 2022-05-14 ENCOUNTER — Other Ambulatory Visit: Payer: 59

## 2022-05-14 ENCOUNTER — Ambulatory Visit: Payer: Self-pay

## 2022-05-14 ENCOUNTER — Other Ambulatory Visit: Payer: Self-pay

## 2022-05-14 ENCOUNTER — Other Ambulatory Visit (INDEPENDENT_AMBULATORY_CARE_PROVIDER_SITE_OTHER): Payer: 59

## 2022-05-14 DIAGNOSIS — N1831 Chronic kidney disease, stage 3a: Secondary | ICD-10-CM

## 2022-05-14 DIAGNOSIS — E876 Hypokalemia: Secondary | ICD-10-CM

## 2022-05-14 DIAGNOSIS — Z87891 Personal history of nicotine dependence: Secondary | ICD-10-CM

## 2022-05-14 DIAGNOSIS — Z951 Presence of aortocoronary bypass graft: Secondary | ICD-10-CM

## 2022-05-14 DIAGNOSIS — G4733 Obstructive sleep apnea (adult) (pediatric): Secondary | ICD-10-CM

## 2022-05-14 DIAGNOSIS — R748 Abnormal levels of other serum enzymes: Secondary | ICD-10-CM

## 2022-05-14 DIAGNOSIS — Z8616 Personal history of COVID-19: Secondary | ICD-10-CM

## 2022-05-14 DIAGNOSIS — N179 Acute kidney failure, unspecified: Secondary | ICD-10-CM

## 2022-05-14 DIAGNOSIS — Z7951 Long term (current) use of inhaled steroids: Secondary | ICD-10-CM

## 2022-05-14 DIAGNOSIS — Z7901 Long term (current) use of anticoagulants: Secondary | ICD-10-CM

## 2022-05-14 DIAGNOSIS — K7460 Unspecified cirrhosis of liver: Secondary | ICD-10-CM

## 2022-05-14 DIAGNOSIS — I50810 Right heart failure, unspecified: Secondary | ICD-10-CM

## 2022-05-14 DIAGNOSIS — E1142 Type 2 diabetes mellitus with diabetic polyneuropathy: Secondary | ICD-10-CM

## 2022-05-14 DIAGNOSIS — I2699 Other pulmonary embolism without acute cor pulmonale: Secondary | ICD-10-CM | POA: Diagnosis not present

## 2022-05-14 DIAGNOSIS — E1122 Type 2 diabetes mellitus with diabetic chronic kidney disease: Secondary | ICD-10-CM

## 2022-05-14 DIAGNOSIS — Z79899 Other long term (current) drug therapy: Secondary | ICD-10-CM

## 2022-05-14 DIAGNOSIS — G510 Bell's palsy: Secondary | ICD-10-CM | POA: Diagnosis not present

## 2022-05-14 DIAGNOSIS — I083 Combined rheumatic disorders of mitral, aortic and tricuspid valves: Secondary | ICD-10-CM

## 2022-05-14 DIAGNOSIS — Z6841 Body Mass Index (BMI) 40.0 and over, adult: Secondary | ICD-10-CM

## 2022-05-14 DIAGNOSIS — I5032 Chronic diastolic (congestive) heart failure: Secondary | ICD-10-CM | POA: Diagnosis not present

## 2022-05-14 DIAGNOSIS — I2510 Atherosclerotic heart disease of native coronary artery without angina pectoris: Secondary | ICD-10-CM

## 2022-05-14 DIAGNOSIS — Z9181 History of falling: Secondary | ICD-10-CM

## 2022-05-14 DIAGNOSIS — Z7982 Long term (current) use of aspirin: Secondary | ICD-10-CM

## 2022-05-14 DIAGNOSIS — I130 Hypertensive heart and chronic kidney disease with heart failure and stage 1 through stage 4 chronic kidney disease, or unspecified chronic kidney disease: Secondary | ICD-10-CM | POA: Diagnosis not present

## 2022-05-14 DIAGNOSIS — Z794 Long term (current) use of insulin: Secondary | ICD-10-CM

## 2022-05-14 LAB — GAMMA GT: GGT: 173 U/L — ABNORMAL HIGH (ref 7–51)

## 2022-05-14 LAB — COMPREHENSIVE METABOLIC PANEL
ALT: 15 U/L (ref 0–53)
AST: 24 U/L (ref 0–37)
Albumin: 3.4 g/dL — ABNORMAL LOW (ref 3.5–5.2)
Alkaline Phosphatase: 117 U/L (ref 39–117)
BUN: 15 mg/dL (ref 6–23)
CO2: 23 mEq/L (ref 19–32)
Calcium: 9.5 mg/dL (ref 8.4–10.5)
Chloride: 104 mEq/L (ref 96–112)
Creatinine, Ser: 1.05 mg/dL (ref 0.40–1.50)
GFR: 73.03 mL/min (ref >60.00)
Glucose, Bld: 158 mg/dL — ABNORMAL HIGH (ref 70–99)
Potassium: 3.4 mEq/L — ABNORMAL LOW (ref 3.5–5.1)
Sodium: 134 mEq/L — ABNORMAL LOW (ref 135–145)
Total Bilirubin: 0.6 mg/dL (ref 0.2–1.2)
Total Protein: 9.2 g/dL — ABNORMAL HIGH (ref 6.0–8.3)

## 2022-05-14 LAB — CBC
HCT: 38.4 % — ABNORMAL LOW (ref 39.0–52.0)
Hemoglobin: 13 g/dL (ref 13.0–17.0)
MCHC: 33.9 g/dL (ref 30.0–36.0)
MCV: 100.3 fl — ABNORMAL HIGH (ref 78.0–100.0)
Platelets: 271 10*3/uL (ref 150.0–400.0)
RBC: 3.83 Mil/uL — ABNORMAL LOW (ref 4.22–5.81)
RDW: 16.1 % — ABNORMAL HIGH (ref 11.5–15.5)
WBC: 5.5 10*3/uL (ref 4.0–10.5)

## 2022-05-14 MED ORDER — SPIRONOLACTONE 25 MG PO TABS: 12.5000 mg | ORAL_TABLET | Freq: Every day | ORAL | 0 refills | Status: DC

## 2022-05-14 MED ORDER — METOPROLOL TARTRATE 25 MG PO TABS: 12.5000 mg | ORAL_TABLET | Freq: Two times a day (BID) | ORAL | 0 refills | Status: DC

## 2022-05-14 NOTE — Patient Instructions (Signed)
Visit Information  Thank you for taking time to visit with me today. Please don't hesitate to contact me if I can be of assistance to you.   Following are the goals we discussed today:   Goals Addressed             This Visit's Progress    Heart Failure and Diabetes Management       Patient Goals/Self Care Activities: -Patient/Caregiver will self-administer medications as prescribed as evidenced by self-report/primary caregiver report  -Patient/Caregiver will attend all scheduled provider appointments as evidenced by clinician review of documented attendance to scheduled appointments and patient/caregiver report -Patient/Caregiver will call provider office for new concerns or questions as evidenced by review of documented incoming telephone call notes and patient report  -wear comfortable, well-fitting shoes -Weigh daily and record (notify MD with 3 lb weight gain over night or 5 lb in a week) -Follow CHF Action Plan -Adhere to low sodium diet   Interventions Today    Flowsheet Row Most Recent Value  Chronic Disease   Chronic disease during today's visit Diabetes, Congestive Heart Failure (CHF)  General Interventions   General Interventions Discussed/Reviewed General Interventions Reviewed, Doctor Visits  Doctor Visits Discussed/Reviewed Doctor Visits Discussed  Education Interventions   Education Provided Provided Education  Provided Verbal Education On Nutrition, Blood Sugar Monitoring  [weights and when to contact physician]  Nutrition Interventions   Nutrition Discussed/Reviewed Nutrition Discussed, Decreasing salt, Decreasing sugar intake  Pharmacy Interventions   Pharmacy Dicussed/Reviewed Medications and their functions              Our next appointment is by telephone on 05/16/22 at 930  Please call the care guide team at [phone] if you need to cancel or reschedule your appointment.   If you are experiencing a Mental Health or [place] or  need someone to talk to, please call the Suicide and Crisis Lifeline: 988   Patient verbalizes understanding of instructions and care plan provided today and agrees to view in [place]. Active MyChart status and patient understanding of how to access instructions and care plan via MyChart confirmed with patient.     The patient has been provided with contact information for the care management team and has been advised to call with any health related questions or concerns.   Jone Baseman, RN, MSN [place] Management Care Management Coordinator Direct Line [phone]

## 2022-05-14 NOTE — Telephone Encounter (Signed)
Rx sent 

## 2022-05-14 NOTE — Patient Outreach (Signed)
  Care Coordination   Follow Up Visit Note   05/14/2022 Name: [name] [name] MRN: [id number] DOB: 19-May-1955  [name] [name] is a 67 y.o. year old male who sees [name] Barrack, MD for primary care. I spoke with  [name] [name] by phone today.Patient missed appointment with Dr. Gale [name] on yesterday. Patient states he was not aware of appointment but will call to reschedule appointment.  Patient also missed calls for meal delivery,  Patient states he was not aware of calls.  Patient to call care guide back.  Patient weight 289 lbs. He states he has been out of spirolactone and will be picking it up today.  Discussed importance of medication adherence and seeing physicians as scheduled.  He verbalized understanding.    What matters to the patients health and wellness today?  Maintaining health    Goals Addressed             This Visit's Progress    Heart Failure and Diabetes Management       Patient Goals/Self Care Activities: -Patient/Caregiver will self-administer medications as prescribed as evidenced by self-report/primary caregiver report  -Patient/Caregiver will attend all scheduled provider appointments as evidenced by clinician review of documented attendance to scheduled appointments and patient/caregiver report -Patient/Caregiver will call provider office for new concerns or questions as evidenced by review of documented incoming telephone call notes and patient report  -wear comfortable, well-fitting shoes -Weigh daily and record (notify MD with 3 lb weight gain over night or 5 lb in a week) -Follow CHF Action Plan -Adhere to low sodium diet   Interventions Today    Flowsheet Row Most Recent Value  Chronic Disease   Chronic disease during today's visit Diabetes, Congestive Heart Failure (CHF)  General Interventions   General Interventions Discussed/Reviewed General Interventions Reviewed, Doctor Visits  Doctor Visits Discussed/Reviewed Doctor Visits Discussed  Education  Interventions   Education Provided Provided Education  Provided Verbal Education On Nutrition, Blood Sugar Monitoring  [weights and when to contact physician]  Nutrition Interventions   Nutrition Discussed/Reviewed Nutrition Discussed, Decreasing salt, Decreasing sugar intake  Pharmacy Interventions   Pharmacy Dicussed/Reviewed Medications and their functions              SDOH assessments and interventions completed:  Yes     Care Coordination Interventions:  Yes, provided   Follow up plan: Follow up call scheduled for 05/16/22    Encounter Outcome:  Pt. Visit Completed   [name] Baseman, RN, MSN [place] Management Care Management Coordinator Direct Line [phone]

## 2022-05-14 NOTE — Telephone Encounter (Signed)
[name] [name] Form faxed to [phone]

## 2022-05-16 ENCOUNTER — Other Ambulatory Visit: Payer: Self-pay | Admitting: *Deleted

## 2022-05-16 ENCOUNTER — Telehealth: Payer: Self-pay

## 2022-05-16 ENCOUNTER — Ambulatory Visit: Payer: Self-pay

## 2022-05-16 NOTE — Patient Instructions (Signed)
Visit Information  Thank you for taking time to visit with me today. Please don't hesitate to contact me if I can be of assistance to you.   Following are the goals we discussed today:   Goals Addressed             This Visit's Progress    Heart Failure and Diabetes Management       Patient Goals/Self Care Activities: -Patient/Caregiver will self-administer medications as prescribed as evidenced by self-report/primary caregiver report  -Patient/Caregiver will attend all scheduled provider appointments as evidenced by clinician review of documented attendance to scheduled appointments and patient/caregiver report -Patient/Caregiver will call provider office for new concerns or questions as evidenced by review of documented incoming telephone call notes and patient report  -wear comfortable, well-fitting shoes -Weigh daily and record (notify MD with 3 lb weight gain over night or 5 lb in a week) -Follow CHF Action Plan -Adhere to low sodium diet   Interventions Today    Flowsheet Row Most Recent Value  Chronic Disease   Chronic disease during today's visit Congestive Heart Failure (CHF), Diabetes  General Interventions   General Interventions Discussed/Reviewed General Interventions Discussed, Doctor Visits  Doctor Visits Discussed/Reviewed Doctor Visits Discussed  PCP/Specialist Visits Compliance with follow-up visit  Education Interventions   Education Provided Provided Education  Provided Verbal Education On Nutrition, Blood Sugar Monitoring, Medication  [Weights and parameters]  Nutrition Interventions   Nutrition Discussed/Reviewed Nutrition Discussed, Decreasing salt, Carbohydrate meal planning, Decreasing sugar intake, Adding fruits and vegetables  Pharmacy Interventions   Pharmacy Dicussed/Reviewed Pharmacy Topics Discussed, Medications and their functions              Our next appointment is by telephone on 05/22/22 at 1100  Please call the care guide team at  [phone] if you need to cancel or reschedule your appointment.   If you are experiencing a Mental Health or Behavioral Health Crisis or need someone to talk to, please call the Suicide and Crisis Lifeline: 988   Patient verbalizes understanding of instructions and care plan provided today and agrees to view in MyChart. Active MyChart status and patient understanding of how to access instructions and care plan via MyChart confirmed with patient.     The patient has been provided with contact information for the care management team and has been advised to call with any health related questions or concerns.   Bary Leriche, RN, MSN [facility] Care Management Care Management Coordinator Direct Line [phone]

## 2022-05-16 NOTE — Patient Outreach (Signed)
  Care Coordination   Follow Up Visit Note   05/16/2022 Name: DAIMION BALESTRIERI MRN: [id number] DOB: Sep 23, 1955  Incoming call from patient. He states he missed his MD appt(Dr. Renold Don) last week and needs contact info to call office to reschedule. Provided pt with office phone number. Patient also states he did not receive his meals that were supposed to be set up from insurance provider. Provided patient with care guide contact info and advised pt per notes three attempts were made to reach patient to arrange and he did not answer calls. He does not recall receiving any missed calls/messages but will follow up and call care guide this morning. Advised patient that his assigned RN CM would be calling him today as well. He voiced understanding.        Care Coordination Interventions:    Interventions Today    Flowsheet Row Most Recent Value  General Interventions   General Interventions Discussed/Reviewed [facility]  [provided pt with phone number to follow up with care guide regarding meal delivery]  Doctor Visits Discussed/Reviewed Doctor Visits Discussed, Specialist  PCP/Specialist Visits Compliance with follow-up visit       Follow up plan:  Assigned RN CM will make outreach to patient.   Encounter Outcome:

## 2022-05-16 NOTE — Patient Outreach (Signed)
  Care Coordination   Follow Up Visit Note   05/16/2022 Name: [name] [name] MRN: [id number] DOB: [birth year]  [name] [name] is a 67 y.o. year old male who sees [name] Dark, MD for primary care. I spoke with  [name] [name] by phone today.  Patient weight is down to 285 lbs today.  He did pick up his aldactone on Wednesday. Discussed importance of weights and when to contact physician.  He verbalized understanding.  What matters to the patients health and wellness today?  Weights    Goals Addressed             This Visit's Progress    Heart Failure and Diabetes Management       Patient Goals/Self Care Activities: -Patient/Caregiver will self-administer medications as prescribed as evidenced by self-report/primary caregiver report  -Patient/Caregiver will attend all scheduled provider appointments as evidenced by clinician review of documented attendance to scheduled appointments and patient/caregiver report -Patient/Caregiver will call provider office for new concerns or questions as evidenced by review of documented incoming telephone call notes and patient report  -wear comfortable, well-fitting shoes -Weigh daily and record (notify MD with 3 lb weight gain over night or 5 lb in a week) -Follow CHF Action Plan -Adhere to low sodium diet   Interventions Today    Flowsheet Row Most Recent Value  Chronic Disease   Chronic disease during today's visit Congestive Heart Failure (CHF), Diabetes  General Interventions   General Interventions Discussed/Reviewed General Interventions Discussed, Doctor Visits  Doctor Visits Discussed/Reviewed Doctor Visits Discussed  PCP/Specialist Visits Compliance with follow-up visit  Education Interventions   Education Provided Provided Education  Provided Verbal Education On Nutrition, Blood Sugar Monitoring, Medication  [Weights and parameters]  Nutrition Interventions   Nutrition Discussed/Reviewed Nutrition Discussed, Decreasing salt,  Carbohydrate meal planning, Decreasing sugar intake, Adding fruits and vegetables  Pharmacy Interventions   Pharmacy Dicussed/Reviewed Pharmacy Topics Discussed, Medications and their functions              SDOH assessments and interventions completed:  Yes     Care Coordination Interventions:  Yes, provided   Follow up plan: Follow up call scheduled for Next week    Encounter Outcome:  Pt. Visit Completed   [name] Leriche, RN, MSN [facility] Care Management Care Management Coordinator Direct Line [phone]

## 2022-05-16 NOTE — Progress Notes (Signed)
Please inform patient of the following:  His liver numbers have improved back to normal and his sodium and potassium are near baseline.  Blood count is also improving compared to a couple of weeks ago.   One of his labs that checks for gallbladder inflammation was elevated. Recommend we check RUQ ultrasound to further evaluate and have him come back this week or next week to recheck GGT.  Please place future orders.

## 2022-05-20 ENCOUNTER — Telehealth: Payer: Self-pay

## 2022-05-20 NOTE — Patient Outreach (Signed)
  Care Coordination   Follow Up Visit Note   05/20/2022 Name: [name] [name] MRN: [id number] DOB: 1955/11/01  Incoming call from patient. He states that he noticed that his urine this morning is more yellow than normal." Denies any burning or painful urination or other UTI sxs at present. Patient admits to drinking quite a few sodas per day-states he had about 3-4 cans of Diet Coke yesterday and only about one bottle of water. Encouraged pt to decrease soda intake and increase water intake. Discussed s/s of UTI and when to alert medical team.He voiced understanding.     Care Coordination Interventions:  Yes, provided   Follow up plan:  Assigned RN CM will follow up with patient as previously scheduled.    Encounter Outcome:  Pt. Visit Completed   Alessandra Grout [facility] Care Management Care Management Community Coordinator Direct Phone: [phone] Toll Free: [phone] Fax: [phone]

## 2022-05-21 ENCOUNTER — Telehealth: Payer: Self-pay | Admitting: Family Medicine

## 2022-05-21 ENCOUNTER — Telehealth: Payer: Self-pay | Admitting: Cardiology

## 2022-05-21 ENCOUNTER — Telehealth: Payer: Self-pay

## 2022-05-21 ENCOUNTER — Ambulatory Visit: Payer: 59 | Attending: Audiologist | Admitting: Audiologist

## 2022-05-21 DIAGNOSIS — I5032 Chronic diastolic (congestive) heart failure: Secondary | ICD-10-CM | POA: Diagnosis not present

## 2022-05-21 DIAGNOSIS — Z7901 Long term (current) use of anticoagulants: Secondary | ICD-10-CM | POA: Diagnosis not present

## 2022-05-21 DIAGNOSIS — I2699 Other pulmonary embolism without acute cor pulmonale: Secondary | ICD-10-CM | POA: Diagnosis not present

## 2022-05-21 DIAGNOSIS — Z8616 Personal history of COVID-19: Secondary | ICD-10-CM | POA: Diagnosis not present

## 2022-05-21 DIAGNOSIS — Z794 Long term (current) use of insulin: Secondary | ICD-10-CM | POA: Diagnosis not present

## 2022-05-21 DIAGNOSIS — E876 Hypokalemia: Secondary | ICD-10-CM | POA: Diagnosis not present

## 2022-05-21 DIAGNOSIS — I130 Hypertensive heart and chronic kidney disease with heart failure and stage 1 through stage 4 chronic kidney disease, or unspecified chronic kidney disease: Secondary | ICD-10-CM | POA: Diagnosis not present

## 2022-05-21 DIAGNOSIS — E1122 Type 2 diabetes mellitus with diabetic chronic kidney disease: Secondary | ICD-10-CM | POA: Diagnosis not present

## 2022-05-21 DIAGNOSIS — I083 Combined rheumatic disorders of mitral, aortic and tricuspid valves: Secondary | ICD-10-CM | POA: Diagnosis not present

## 2022-05-21 DIAGNOSIS — E1142 Type 2 diabetes mellitus with diabetic polyneuropathy: Secondary | ICD-10-CM | POA: Diagnosis not present

## 2022-05-21 DIAGNOSIS — N1831 Chronic kidney disease, stage 3a: Secondary | ICD-10-CM | POA: Diagnosis not present

## 2022-05-21 DIAGNOSIS — N179 Acute kidney failure, unspecified: Secondary | ICD-10-CM | POA: Diagnosis not present

## 2022-05-21 DIAGNOSIS — G510 Bell's palsy: Secondary | ICD-10-CM | POA: Diagnosis not present

## 2022-05-21 DIAGNOSIS — Z951 Presence of aortocoronary bypass graft: Secondary | ICD-10-CM | POA: Diagnosis not present

## 2022-05-21 DIAGNOSIS — Z87891 Personal history of nicotine dependence: Secondary | ICD-10-CM | POA: Diagnosis not present

## 2022-05-21 DIAGNOSIS — Z7982 Long term (current) use of aspirin: Secondary | ICD-10-CM | POA: Diagnosis not present

## 2022-05-21 DIAGNOSIS — G4733 Obstructive sleep apnea (adult) (pediatric): Secondary | ICD-10-CM | POA: Diagnosis not present

## 2022-05-21 DIAGNOSIS — Z79899 Other long term (current) drug therapy: Secondary | ICD-10-CM | POA: Diagnosis not present

## 2022-05-21 DIAGNOSIS — Z7951 Long term (current) use of inhaled steroids: Secondary | ICD-10-CM | POA: Diagnosis not present

## 2022-05-21 DIAGNOSIS — I2510 Atherosclerotic heart disease of native coronary artery without angina pectoris: Secondary | ICD-10-CM | POA: Diagnosis not present

## 2022-05-21 DIAGNOSIS — I50810 Right heart failure, unspecified: Secondary | ICD-10-CM | POA: Diagnosis not present

## 2022-05-21 DIAGNOSIS — K7460 Unspecified cirrhosis of liver: Secondary | ICD-10-CM | POA: Diagnosis not present

## 2022-05-21 NOTE — Telephone Encounter (Signed)
Caller states patient informed her he has restarted taking Torsemide 10 mg instead of 20 mg. Medication was originally prescribed by Dr. Donato Schultz. States pt restarted med due to putting on 5 pounds within the past week.   Caller also states patients vital and blood sugars are well controlled. States she will be informing Dr. Anne Fu and wanted to inform PCP as well due to PCP signing off on orders. Angelique Blonder can be contacted at [phone].

## 2022-05-21 NOTE — Telephone Encounter (Signed)
Caller states patient reported he had a a fall a few days ago. States patient only sustained a minor scrap on right elbow. Currently no signs of infection or further injuries. For more information Rosanne Ashing can be contacted @ [phone].

## 2022-05-21 NOTE — Telephone Encounter (Signed)
   Telephone encounter was:  Unsuccessful.  05/21/2022 Name: [name] [name] MRN: [id number] DOB: 01-Nov-1955  Unsuccessful outbound call made today to assist with:  Food Insecurity  Outreach Attempt:  4th   A HIPAA compliant voice message was left requesting a return call.  Instructed patient to call back .   Lenard Forth [facility] Hospital Guide, [facility] [phone] [address] [place], [place], [place] [address] Phone: [phone] Email: [email or web address]

## 2022-05-21 NOTE — Telephone Encounter (Signed)
Patient was recently in the hospital and his torsemide (DEMADEX) 20 MG tablet  got discontinue. Patient has put on 5lbs in a week,  patient decided to start taking the medication again. Start taking the 10mg . Nurse is calling to see what the Dr want her to do. Please advise

## 2022-05-21 NOTE — Patient Outreach (Signed)
  Care Coordination   Follow Up Visit Note   05/21/2022 Name: [name] [name] MRN: [id number] DOB: 03-Oct-1955  Incoming call from patient. He states he is almost out of his Metoprolol and will need a refill called in. Advised patient to contact pharmacy so that can send refill request to provider.He voiced understanding. Patient wanting to know if MD knew med was changed while in the hospital and advised patient that MD aware as new dosage on file in chart. Patient states his urine is better-more light yellow. He has not had a soda since speaking with RN CM.    Care Coordination Interventions:  Yes, provided   Follow up plan:  Assigned RN CM will follow up as previously scheduled.    Encounter Outcome:  Pt. Visit Completed   Alessandra Grout [facility] Care Management Care Management Community Coordinator Direct Phone: [phone] Toll Free: [phone] Fax: [phone]

## 2022-05-22 ENCOUNTER — Ambulatory Visit: Payer: Self-pay

## 2022-05-22 ENCOUNTER — Telehealth: Payer: Self-pay

## 2022-05-22 NOTE — Telephone Encounter (Signed)
reviewed with Dr Anne Fu who advised OK for pt to take torsemide as previously ordered.

## 2022-05-22 NOTE — Patient Outreach (Signed)
  Care Coordination   Follow Up Visit Note   05/22/2022 Name: [name] [name] MRN: [id number] DOB: [birth year]  [name] [name] is a 67 y.o. year old male who sees [name], MD for primary care. I spoke with  [name] by phone today.  What matters to the patients health and wellness today?  Health management    Goals Addressed             This Visit's Progress    Heart Failure and Diabetes Management       Patient Goals/Self Care Activities: -Patient/Caregiver will self-administer medications as prescribed as evidenced by self-report/primary caregiver report  -Patient/Caregiver will attend all scheduled provider appointments as evidenced by clinician review of documented attendance to scheduled appointments and patient/caregiver report -Patient/Caregiver will call provider office for new concerns or questions as evidenced by review of documented incoming telephone call notes and patient report  -wear comfortable, well-fitting shoes -Weigh daily and record (notify MD with 3 lb weight gain over night or 5 lb in a week) -Follow CHF Action Plan -Adhere to low sodium diet   -Patient weight 286 lbs.   -Discussed medication management and possible pill packaging to stay on track. Patient agreeable.  Message CCM pharmacist. -Patient reports taking torsemide.  Advised patient that this has been discontinued and not to take.  He verbalized understanding.   -Patient did get post-discharge meals scheduled.   Interventions Today    Flowsheet Row Most Recent Value  Chronic Disease   Chronic disease during today's visit Congestive Heart Failure (CHF), Diabetes  General Interventions   General Interventions Discussed/Reviewed General Interventions Reviewed, Doctor Visits, Communication with  Doctor Visits Discussed/Reviewed Doctor Visits Discussed  Communication with Pharmacists  Education Interventions   Education Provided Provided Education  Provided Verbal Education On  Nutrition, Medication, Blood Sugar Monitoring, When to see the doctor  [weights and when to call physician]  Nutrition Interventions   Nutrition Discussed/Reviewed Nutrition Discussed, Decreasing salt, Portion sizes, Decreasing sugar intake, Adding fruits and vegetables  Pharmacy Interventions   Pharmacy Dicussed/Reviewed Referral to Pharmacist  Referral to Pharmacist --  [Medication management]              SDOH assessments and interventions completed:  Yes     Care Coordination Interventions:  Yes, provided   Follow up plan: Follow up call scheduled for 06/03/22    Encounter Outcome:  Pt. Visit Completed   Bary Leriche, RN, MSN [facility] Care Management Care Management Coordinator Direct Line [phone]

## 2022-05-22 NOTE — Telephone Encounter (Signed)
Spoke with patient, stated had a fall two days ago, no pain at this time  Was sore yesterday, had a scratch on his elbow  Doing good today  Advise to schedule an appointment with PCP if soreness not improving

## 2022-05-22 NOTE — Patient Instructions (Signed)
Visit Information  Thank you for taking time to visit with me today. Please don't hesitate to contact me if I can be of assistance to you.   Following are the goals we discussed today:   Goals Addressed             This Visit's Progress    Heart Failure and Diabetes Management       Patient Goals/Self Care Activities: -Patient/Caregiver will self-administer medications as prescribed as evidenced by self-report/primary caregiver report  -Patient/Caregiver will attend all scheduled provider appointments as evidenced by clinician review of documented attendance to scheduled appointments and patient/caregiver report -Patient/Caregiver will call provider office for new concerns or questions as evidenced by review of documented incoming telephone call notes and patient report  -wear comfortable, well-fitting shoes -Weigh daily and record (notify MD with 3 lb weight gain over night or 5 lb in a week) -Follow CHF Action Plan -Adhere to low sodium diet   -Patient weight 286 lbs.   -Discussed medication management and possible pill packaging to stay on track. Patient agreeable.  Message CCM pharmacist. -Patient reports taking torsemide.  Advised patient that this has been discontinued and not to take.  He verbalized understanding.   -Patient did get post-discharge meals scheduled.   Interventions Today    Flowsheet Row Most Recent Value  Chronic Disease   Chronic disease during today's visit Congestive Heart Failure (CHF), Diabetes  General Interventions   General Interventions Discussed/Reviewed General Interventions Reviewed, Doctor Visits, Communication with  Doctor Visits Discussed/Reviewed Doctor Visits Discussed  Communication with Pharmacists  Education Interventions   Education Provided Provided Education  Provided Verbal Education On Nutrition, Medication, Blood Sugar Monitoring, When to see the doctor  [weights and when to call physician]  Nutrition Interventions   Nutrition  Discussed/Reviewed Nutrition Discussed, Decreasing salt, Portion sizes, Decreasing sugar intake, Adding fruits and vegetables  Pharmacy Interventions   Pharmacy Dicussed/Reviewed Referral to Pharmacist  Referral to Pharmacist --  [Medication management]              Our next appointment is by telephone on 06/03/22 at 130 pm  Please call the care guide team at [phone] if you need to cancel or reschedule your appointment.   If you are experiencing a Mental Health or Behavioral Health Crisis or need someone to talk to, please call the Suicide and Crisis Lifeline: 988   Patient verbalizes understanding of instructions and care plan provided today and agrees to view in MyChart. Active MyChart status and patient understanding of how to access instructions and care plan via MyChart confirmed with patient.     The patient has been provided with contact information for the care management team and has been advised to call with any health related questions or concerns.   Bary Leriche, RN, MSN [facility] Care Management Care Management Coordinator Direct Line [phone]

## 2022-05-22 NOTE — Telephone Encounter (Signed)
   Telephone encounter was:  Successful.  05/22/2022 Name: [name] [name] MRN: [id number] DOB: 1955-11-17  [name] [name] is a 67 y.o. year old male who is a primary care patient of [name], [name], MD . The community resource team was consulted for assistance with Food Insecurity  Care guide performed the following interventions: Patient provided with information about care guide support team and interviewed to confirm resource needs.Patient was discharged on 3/16 from [place] and needs moms meals after several attempts I was able to reach the patient and set up meals with [facility]   Follow Up Plan:  No further follow up planned at this time. The patient has been provided with needed resources.    Lenard Forth [facility] Guide, [facility] [phone] [address] [place], [place], [place] [address] Phone: [phone] Email: [email or web address]

## 2022-05-22 NOTE — Telephone Encounter (Signed)
Pt is aware and state he is just going to take 1/2 of 20 mg tablet daily.  Advised to continue weighing daily and notify the office if wt increases 3 lbs over night or 5 lbs in 1 week.  Of note, pt's kidney function was elevated in the hospital and torsemide was d/c.  Kidney function is now normal with last lab work 8 days ago. Dr Anne Fu is aware of this information.  Pt reports he will call back to schedule follow up with Dr Anne Fu or someone in our office.

## 2022-05-23 ENCOUNTER — Ambulatory Visit (INDEPENDENT_AMBULATORY_CARE_PROVIDER_SITE_OTHER): Payer: 59 | Admitting: Internal Medicine

## 2022-05-23 ENCOUNTER — Encounter: Payer: Self-pay | Admitting: Internal Medicine

## 2022-05-23 ENCOUNTER — Other Ambulatory Visit: Payer: Self-pay

## 2022-05-23 ENCOUNTER — Telehealth: Payer: Self-pay

## 2022-05-23 ENCOUNTER — Telehealth: Payer: Self-pay | Admitting: Pharmacist

## 2022-05-23 ENCOUNTER — Telehealth: Payer: Self-pay | Admitting: Family Medicine

## 2022-05-23 VITALS — BP 134/76 | HR 103 | Resp 16 | Ht 71.0 in | Wt 293.0 lb

## 2022-05-23 DIAGNOSIS — R7881 Bacteremia: Secondary | ICD-10-CM | POA: Diagnosis not present

## 2022-05-23 NOTE — Telephone Encounter (Signed)
See note

## 2022-05-23 NOTE — Progress Notes (Signed)
Care Management & Coordination Services Pharmacy Team  Reason for Encounter: Appointment Reminder  Contacted patient to confirm telephone appointment with Erskine Emery, PharmD on 05/26/2022 at 11 am. Spoke with patient on 05/23/2022    Star Rating Drugs:  Lantus Solostar last filled 05/05/2022 90 DS Humalog [id number] last filled 2/06/202492 DS   Care Gaps: Annual wellness visit in last year? Yes  If Diabetic: Last eye exam / retinopathy screening: 03/13/2020 Last diabetic foot exam: 11/02/2020   Future Appointments  Date Time Provider Department Center  05/26/2022 11:00 AM Erroll Luna, [facility] None  05/29/2022 10:40 AM [name], MD [facility]  06/03/2022  1:30 PM Fleeta Emmer, RN [facility] None  06/05/2022 11:00 AM Barrie Folk, AUD [facility] None  12/04/2022 10:00 AM [facility] Fenton Malling VISIT 1 [facility]   April D Calhoun, [facility] Clinical Pharmacist Assistant [phone]

## 2022-05-23 NOTE — Patient Instructions (Signed)
Please discuss with your primary care doctor or heart doctor about water pills, heart failure, and weight   As for your recent infection, it appears your body had cleared it Continue to monitor in the next 2-4 weeks for sign of fever, chill, persistent worsening lower back pain, then let me know if these happen  Otherwise no need to follow up with me     ---- For your chronic legs swelling. I do not think compression stocking at this time will help. I would suggest you talk with your primary care doctor to refer you to the wound clinic and ask for them to help with Una-boot legs wrap. Once your legs swelling almost normalize then your compression stocking

## 2022-05-23 NOTE — Telephone Encounter (Signed)
Error

## 2022-05-23 NOTE — Patient Instructions (Signed)
Visit Information  Thank you for taking time to visit with me today. Please don't hesitate to contact me if I can be of assistance to you.   Following are the goals we discussed today:   Goals Addressed             This Visit's Progress    Heart Failure and Diabetes Management       Patient Goals/Self Care Activities: -Patient/Caregiver will self-administer medications as prescribed as evidenced by self-report/primary caregiver report  -Patient/Caregiver will attend all scheduled provider appointments as evidenced by clinician review of documented attendance to scheduled appointments and patient/caregiver report -Patient/Caregiver will call provider office for new concerns or questions as evidenced by review of documented incoming telephone call notes and patient report  -wear comfortable, well-fitting shoes -Weigh daily and record (notify MD with 3 lb weight gain over night or 5 lb in a week) -Follow CHF Action Plan -Adhere to low sodium diet   -Patient weight 286 lbs.   -Discussed medication management and possible pill packaging to stay on track. Patient agreeable.  Message CCM pharmacist. -Patient reports taking torsemide.  Advised patient that this has been discontinued and not to take.  He verbalized understanding.   -Patient did get post-discharge meals scheduled.   Interventions Today    Flowsheet Row Most Recent Value  Chronic Disease   Chronic disease during today's visit Congestive Heart Failure (CHF)  General Interventions   General Interventions Discussed/Reviewed General Interventions Reviewed  Doctor Visits Discussed/Reviewed Doctor Visits Reviewed  PCP/Specialist Visits Compliance with follow-up visit  [Patiet has ifectious disease appointment this AM.]  Education Interventions   Education Provided Provided Education  Provided Verbal Education On Medication  [Patient reports that the physician put him back on his torsemide regularly.]              Our  next appointment is by telephone on 06/03/22 at 1100 am  Please call the care guide team at [phone] if you need to cancel or reschedule your appointment.   If you are experiencing a Mental Health or Behavioral Health Crisis or need someone to talk to, please call the Suicide and Crisis Lifeline: 988   Patient verbalizes understanding of instructions and care plan provided today and agrees to view in MyChart. Active MyChart status and patient understanding of how to access instructions and care plan via MyChart confirmed with patient.     The patient has been provided with contact information for the care management team and has been advised to call with any health related questions or concerns.   Bary Leriche, RN, MSN [facility] Care Management Care Management Coordinator Direct Line [phone]

## 2022-05-23 NOTE — Patient Outreach (Signed)
  Care Coordination   Follow Up Visit Note   05/23/2022 Name: [name] [name] MRN: [id number] DOB: 20-Jun-1955  [name] [name] is a 67 y.o. year old male who sees [name] Dark, MD for primary care. I spoke with  [name] [name] by phone today.  What matters to the patients health and wellness today?  Has appointment with physician today-Dr. Renold Don    Goals Addressed             This Visit's Progress    Heart Failure and Diabetes Management       Patient Goals/Self Care Activities: -Patient/Caregiver will self-administer medications as prescribed as evidenced by self-report/primary caregiver report  -Patient/Caregiver will attend all scheduled provider appointments as evidenced by clinician review of documented attendance to scheduled appointments and patient/caregiver report -Patient/Caregiver will call provider office for new concerns or questions as evidenced by review of documented incoming telephone call notes and patient report  -wear comfortable, well-fitting shoes -Weigh daily and record (notify MD with 3 lb weight gain over night or 5 lb in a week) -Follow CHF Action Plan -Adhere to low sodium diet   -Patient weight 286 lbs.   -Discussed medication management and possible pill packaging to stay on track. Patient agreeable.  Message CCM pharmacist. -Patient reports taking torsemide.  Advised patient that this has been discontinued and not to take.  He verbalized understanding.   -Patient did get post-discharge meals scheduled.   Interventions Today    Flowsheet Row Most Recent Value  Chronic Disease   Chronic disease during today's visit Congestive Heart Failure (CHF)  General Interventions   General Interventions Discussed/Reviewed General Interventions Reviewed  Doctor Visits Discussed/Reviewed Doctor Visits Reviewed  PCP/Specialist Visits Compliance with follow-up visit  [Patiet has ifectious disease appointment this AM.]  Education Interventions   Education Provided  Provided Education  Provided Verbal Education On Medication  [Patient reports that the physician put him back on his torsemide regularly.]              SDOH assessments and interventions completed:  Yes     Care Coordination Interventions:  Yes, provided   Follow up plan: Follow up call scheduled for 06/03/22    Encounter Outcome:  Pt. Visit Completed   Bary Leriche, RN, MSN [facility] Care Management Care Management Coordinator Direct Line [phone]

## 2022-05-23 NOTE — Progress Notes (Signed)
[facility]  Patient Active Problem List   Diagnosis Date Noted   Severe tricuspid regurgitation 04/19/2022   Pulmonary embolism 04/19/2022   RVF (right ventricular failure) 04/19/2022   Decreased hearing 04/15/2022   Preoperative clearance 08/27/2021   Ectopic atrial tachycardia 02/12/2021   Simple chronic bronchitis 12/17/2020   PND (post-nasal drip) 12/17/2020   Cough 10/31/2019   Intertrigo 10/31/2019   Coronary artery disease of bypass graft of native heart with stable angina pectoris    Type 2 diabetes mellitus with other specified complication (HCC), followed by Dr. Sharl Ma 10/14/2018   Chronic heart failure with preserved ejection fraction 08/29/2017   Varicose vein of leg 04/13/2017   Polyneuropathy associated with underlying disease    Osteoarthritis 01/21/2017   Seasonal and perennial allergic rhinitis 07/17/2016   Ulnar neuritis, left 03/27/2016   PVD (peripheral vascular disease) (HCC) 04/30/2009   Morbid obesity    HLD (hyperlipidemia) 01/04/2007   Hepatic cirrhosis (HCC) 01/04/2007   Obstructive sleep apnea 01/04/2007   HTN (hypertension) 01/04/2007      Subjective:    Patient ID: [name], male    DOB: 03/28/1955, 67 y.o.   MRN: [id number]  No chief complaint on file.   HPI:  RAIDYN WASSINK is a 67 y.o. male cad/cabg, cirrhosis, HFpEF, ckd3, dm2, obese, neuropathy, here for hospital f/u group a strep bacteremia   Patient admitted 3/6-3/16/24 admitted with hypoxic respiratory failure/chf found to have GAS bsi and sepsis as well 3/07 bcx GAS 3/09 bcx negative Tte showed tv mal-coaptation/severe tr Tee severe RA and RV enlargement with moderate RV dysfunction; small oscillating density AV lamb's vs possible veg; severe tr Back pain and ct L-spine no osseous abnormality; mri lspine also without OM/soft tissue infection Repeat bcx quickly cleared  Chronic bilateral LE open wound without surorunding  cellulitis  ID evaluated by me and low suspicion IE so given 2 weeks abx pcng --> amox high dose finished 05/01/22. Initial few days also with linezolid   Other complication -- PE and taking eliquis planned 3 month course per pcp  He saw pcp on 05/02/22 doing well at home. Some complaint of back pain still. Chart mentioned a few days amoxicillin left at that time  05/23/22 id clinic f/u He is about 2 weeks from finishing abx Patient asks about water pills and leg swelling and compression stocking He said lower back pain gone No fever, chill Feeling well and eating well   Allergies  Allergen Reactions   Empagliflozin Rash    Patient reports yeast.    Ibuprofen Other (See Comments)    Stomach pain      Outpatient Medications Prior to Visit  Medication Sig Dispense Refill   apixaban (ELIQUIS) 5 MG TABS tablet Take 1 tablet (5 mg total) by mouth 2 (two) times daily. 60 tablet 1   APIXABAN (ELIQUIS) VTE STARTER PACK (10MG  AND 5MG ) Take as directed on package: start with two-5mg  tablets twice daily for 7 days. On day 8, switch to one-5mg  tablet twice daily. 74 each 0   aspirin EC 81 MG tablet Take 81 mg by mouth daily.     B-D ULTRAFINE III SHORT PEN 31G X 8 MM MISC   1   diclofenac Sodium (VOLTAREN) 1 % GEL APPLY 2 GRAMS TOPICALLY 4 TIMES DAILY TO KNEE 100 g 2   famotidine (PEPCID) 20 MG tablet TAKE 1 TABLET(20 MG) BY MOUTH DAILY (Patient taking differently: Take 20  mg by mouth daily as needed for heartburn.) 90 tablet 1   fluticasone (FLONASE) 50 MCG/ACT nasal spray Place 1 spray into both nostrils daily. 16 g 12   Fluticasone-Umeclidin-Vilant (TRELEGY ELLIPTA) 100-62.5-25 MCG/ACT AEPB Inhale 1 puff into the lungs daily. 180 each 3   HUMALOG 100 UNIT/ML injection Inject into the skin See admin instructions. Per SSI     hydroxypropyl methylcellulose / hypromellose (ISOPTO TEARS / GONIOVISC) 2.5 % ophthalmic solution Place 2 drops into both eyes as needed for dry eyes. 15 mL 12   LANTUS  SOLOSTAR 100 UNIT/ML Solostar Pen Inject 20 Units into the skin every morning. 15 mL 11   lidocaine (LIDODERM) 5 % Place 1 patch onto the skin daily. Remove & Discard patch within 12 hours or as directed by MD 5 patch 0   MAGNESIUM-OXIDE 400 (241.3 Mg) MG tablet Take 1 tablet by mouth daily.     metoprolol tartrate (LOPRESSOR) 25 MG tablet Take 0.5 tablets (12.5 mg total) by mouth 2 (two) times daily. 30 tablet 0   nitroGLYCERIN (NITROSTAT) 0.4 MG SL tablet Place 1 tablet (0.4 mg total) under the tongue every 5 (five) minutes as needed for chest pain. 25 tablet 3   ONETOUCH ULTRA test strip TEST AS DIRECTED 8 TIMES DAILY 800 strip 0   polyethylene glycol (MIRALAX / GLYCOLAX) 17 g packet Take 17 g by mouth daily. 14 each 0   PROAIR HFA 108 (90 Base) MCG/ACT inhaler Inhale 2 puffs into the lungs every 6 (six) hours as needed for wheezing or shortness of breath. 8.5 g 5   rosuvastatin (CRESTOR) 20 MG tablet Take 1 tablet (20 mg total) by mouth every evening. 30 tablet 0   silver sulfADIAZINE (SSD) 1 % cream APPLY PEA SIZED AMOUNT TOPICALLY TO WOUND DAILY 50 g 0   spironolactone (ALDACTONE) 25 MG tablet Take 0.5 tablets (12.5 mg total) by mouth daily. 15 tablet 0   torsemide (DEMADEX) 20 MG tablet Take 10 mg by mouth daily. Pt taking 1/2 tab daily     No facility-administered medications prior to visit.     Social History   Socioeconomic History   Marital status: Significant Other    Spouse name: Not on file   Number of children: Not on file   Years of education: Not on file   Highest education level: Not on file  Occupational History   Occupation: Disabled     Comment: previously worked in delivery   Tobacco Use   Smoking status: Former    Packs/day: 1.50    Years: 30.00    Additional pack years: 0.00    Total pack years: 45.00    Types: Cigarettes    Quit date: 10/20/2004    Years since quitting: 17.6    Passive exposure: Never   Smokeless tobacco: Never  Vaping Use   Vaping Use:  Never used  Substance and Sexual Activity   Alcohol use: Yes    Comment: occasional   Drug use: No   Sexual activity: Not Currently  Other Topics Concern   Not on file  Social History Narrative   Lives with significant other x 30+ years, apartment "friends smoke". Disability. Is disabled secondary to his work-related injury.       Has over 20 grandchildren    Social Determinants of Health   Financial Resource Strain: Low Risk  (11/28/2021)   Overall Financial Resource Strain (CARDIA)    Difficulty of Paying Living Expenses: Not hard at all  Food  Insecurity: Food Insecurity Present (05/05/2022)   Hunger Vital Sign    Worried About Running Out of Food in the Last Year: Never true    Ran Out of Food in the Last Year: Sometimes true  Transportation Needs: No Transportation Needs (05/05/2022)   PRAPARE - Administrator, Civil Service (Medical): No    Lack of Transportation (Non-Medical): No  Physical Activity: Sufficiently Active (11/28/2021)   Exercise Vital Sign    Days of Exercise per Week: 5 days    Minutes of Exercise per Session: 30 min  Stress: No Stress Concern Present (11/28/2021)   [employer] of Occupational Health - Occupational Stress Questionnaire    Feeling of Stress : Not at all  Social Connections: Socially Isolated (11/28/2021)   Social Connection and Isolation Panel [NHANES]    Frequency of Communication with Friends and Family: More than three times a week    Frequency of Social Gatherings with Friends and Family: More than three times a week    Attends Religious Services: Never    Database administrator or Organizations: No    Attends Banker Meetings: Never    Marital Status: Never married  Intimate Partner Violence: Not At Risk (04/17/2022)   Humiliation, Afraid, Rape, and Kick questionnaire    Fear of Current or Ex-Partner: No    Emotionally Abused: No    Physically Abused: No    Sexually Abused: No      Review of  Systems    All other ros negative  Objective:    BP 134/76   Pulse (!) 103   Resp 16   Ht 5\' 11"  (1.803 m)   Wt 293 lb (132.9 kg)   SpO2 97%   BMI 40.87 kg/m  Nursing note and vital signs reviewed.  Physical Exam     General/constitutional: no distress, pleasant HEENT: Normocephalic, PER, Conj Clear, EOMI, Oropharynx clear Neck supple CV: rrr no mrg Lungs: clear to auscultation, normal respiratory effort Abd: Soft, Nontender Ext: bilateral legs 2-3 plus swelling to thighs.  Skin: No Rash; no open wounds Neuro: nonfocal MSK: no peripheral joint swelling/tenderness/warmth; back spines nontender     Labs: Lab Results  Component Value Date   WBC 5.5 05/14/2022   HGB 13.0 05/14/2022   HCT 38.4 (L) 05/14/2022   MCV 100.3 (H) 05/14/2022   PLT 271.0 05/14/2022   Last metabolic panel Lab Results  Component Value Date   GLUCOSE 158 (H) 05/14/2022   NA 134 (L) 05/14/2022   K 3.4 (L) 05/14/2022   CL 104 05/14/2022   CO2 23 05/14/2022   BUN 15 05/14/2022   CREATININE 1.05 05/14/2022   GFRNONAA >60 04/26/2022   CALCIUM 9.5 05/14/2022   PHOS 2.5 04/20/2022   PROT 9.2 (H) 05/14/2022   ALBUMIN 3.4 (L) 05/14/2022   LABGLOB 3.9 04/08/2022   AGRATIO 0.9 (L) 04/08/2022   BILITOT 0.6 05/14/2022   ALKPHOS 117 05/14/2022   AST 24 05/14/2022   ALT 15 05/14/2022   ANIONGAP 8 04/26/2022    Micro:  Serology:  Imaging: Reviewed  3/08 mri lumbar spine 1. Evaluation is limited by motion artifact. 2. No evidence of discitis-osteomyelitis. No epidural abscess. 3. Congenitally short pedicles, which narrow the AP diameter of the spinal canal, and epidural lipomatosis, which results in moderate thecal sac narrowing at multiple levels. 4. Mild neural foraminal narrowing bilaterally at L3-L4 and L4-L5 and on the right at L5-S1.   Assessment & Plan:   Problem List  Items Addressed This Visit   None Visit Diagnoses     Bacteremia    -  Primary         No orders  of the defined types were placed in this encounter.    Doing well no sign of relapse from blood stream infection Advise sign of relapse and call me if needed   Advise him to check in with his pcp/cardiology about heart failure management Advise him also to discuss referral to wound clinic for leg swelling/edema by his pcp   F/u as needed   Follow-up: Return if symptoms worsen or fail to improve.      Raymondo Band, MD [facility] 05/23/2022, 9:54 AM

## 2022-05-25 LAB — HEMOGLOBIN A1C: Hemoglobin A1C: 8.2

## 2022-05-26 ENCOUNTER — Telehealth: Payer: Self-pay | Admitting: Internal Medicine

## 2022-05-26 ENCOUNTER — Ambulatory Visit: Payer: 59 | Admitting: Pharmacist

## 2022-05-26 DIAGNOSIS — E1142 Type 2 diabetes mellitus with diabetic polyneuropathy: Secondary | ICD-10-CM | POA: Diagnosis not present

## 2022-05-26 DIAGNOSIS — Z794 Long term (current) use of insulin: Secondary | ICD-10-CM | POA: Diagnosis not present

## 2022-05-26 DIAGNOSIS — E119 Type 2 diabetes mellitus without complications: Secondary | ICD-10-CM | POA: Diagnosis not present

## 2022-05-26 DIAGNOSIS — H25811 Combined forms of age-related cataract, right eye: Secondary | ICD-10-CM | POA: Diagnosis not present

## 2022-05-26 DIAGNOSIS — E1165 Type 2 diabetes mellitus with hyperglycemia: Secondary | ICD-10-CM | POA: Diagnosis not present

## 2022-05-26 MED ORDER — PROAIR HFA 108 (90 BASE) MCG/ACT IN AERS: 2.0000 | INHALATION_SPRAY | Freq: Four times a day (QID) | RESPIRATORY_TRACT | 3 refills | Status: DC | PRN

## 2022-05-26 MED ORDER — TRELEGY ELLIPTA 100-62.5-25 MCG/ACT IN AEPB: INHALATION_SPRAY | RESPIRATORY_TRACT | 3 refills | Status: DC

## 2022-05-26 NOTE — Progress Notes (Signed)
Care Management & Coordination Services Pharmacy Note  05/27/2022 Name:  [name] [name] MRN:  [id number] DOB:  [birth year]  Summary: PharmD FU visit.  Patient interested in pill packs due to difficulty seeing medication and managing adherence with current situation.  Discussed recent hospital stay and ways to prevent readmission.  BP is controlled and glucose seems to be improving with recent weight loss.  Due to HF would love to have him on SGLT-2 but has had yeast problems in the past.  Could consider retrial if glucose gets under better control.  Meds affordable.  Set up with [facility] pill packs and synchronization.  Recommendations/Changes made from today's visit: Due for eye exam and foot exam. No changes to meds at this time. Needs rx's called in for [facility] packaging  Follow up plan: FU 3 months CMA DM 1 month   Subjective: [name] [name] is an 67 y.o. year old male who is a primary patient of [name] [name], [name] Degree, MD.  The care coordination team was consulted for assistance with disease management and care coordination needs.    Engaged with patient by telephone for follow up visit.  Recent office visits:  None since last adherence call   Recent consult visits:  01/10/2022 OV (Sports Medicine) [name] [name]; no medication changes noted.   11/12/2021 OV (Sports Medicine) [name] [name]; no medication changes noted.   Hospital visits:  None in previous 6 months   Objective:  Lab Results  Component Value Date   CREATININE 1.05 05/14/2022   BUN 15 05/14/2022   GFR 73.03 05/14/2022   EGFR 43 (L) 04/08/2022   GFRNONAA >60 04/26/2022   GFRAA 65 12/12/2019   NA 134 (L) 05/14/2022   K 3.4 (L) 05/14/2022   CALCIUM 9.5 05/14/2022   CO2 23 05/14/2022   GLUCOSE 158 (H) 05/14/2022    Lab Results  Component Value Date/Time   HGBA1C 9.7 (H) 04/17/2022 08:30 AM   HGBA1C 11.0 (H) 05/09/2021 09:57 AM   HGBA1C 10.6 02/02/2020 12:00 AM   HGBA1C 10.8 10/12/2019  12:00 AM   HGBA1C 10.8 10/12/2019 12:00 AM   GFR 73.03 05/14/2022 10:18 AM   GFR 72.22 05/02/2022 11:48 AM   MICROALBUR 22.48 03/24/2018 12:00 AM    Last diabetic Eye exam: No results found for: "HMDIABEYEEXA"  Last diabetic Foot exam: No results found for: "HMDIABFOOTEX"   Lab Results  Component Value Date   CHOL 130 04/08/2022   HDL 46 04/08/2022   LDLCALC 53 04/08/2022   LDLDIRECT 71.0 10/07/2018   TRIG 186 (H) 04/08/2022   CHOLHDL 2.8 04/08/2022       Latest Ref Rng & Units 05/14/2022   10:18 AM 05/02/2022   11:48 AM 04/25/2022   12:50 AM  Hepatic Function  Total Protein 6.0 - 8.3 g/dL 9.2  8.8  8.2   Albumin 3.5 - 5.2 g/dL 3.4  3.2  2.2   AST 0 - 37 U/L 24  51  18   ALT 0 - 53 U/L 15  36  20   Alk Phosphatase 39 - 117 U/L 117  230  144   Total Bilirubin 0.2 - 1.2 mg/dL 0.6  0.8  1.6     Lab Results  Component Value Date/Time   TSH 3.06 10/07/2018 02:47 PM   TSH 2.99 03/23/2018 12:00 AM   TSH 2.99 03/23/2018 12:00 AM   TSH 1.71 03/20/2017 03:14 PM       Latest Ref Rng & Units 05/14/2022   10:18  AM 05/02/2022   11:48 AM 04/26/2022   12:41 AM  CBC  WBC 4.0 - 10.5 K/uL 5.5  5.1  6.0   Hemoglobin 13.0 - 17.0 g/dL 44.0  34.7  42.5   Hematocrit 39.0 - 52.0 % 38.4  38.1  37.4   Platelets 150.0 - 400.0 K/uL 271.0  259.0  239     No results found for: "VD25OH", "VITAMINB12"  Clinical ASCVD: No  The ASCVD Risk score (Arnett DK, et al., 2019) failed to calculate for the following reasons:   The patient has a prior MI or stroke diagnosis        05/05/2022    9:26 AM 05/02/2022   10:46 AM 04/15/2022   11:08 AM  Depression screen PHQ 2/9  Decreased Interest 0 0 0  Down, Depressed, Hopeless 0 0 0  PHQ - 2 Score 0 0 0     Social History   Tobacco Use  Smoking Status Former   Packs/day: 1.50   Years: 30.00   Additional pack years: 0.00   Total pack years: 45.00   Types: Cigarettes   Quit date: 10/20/2004   Years since quitting: 17.6   Passive exposure: Never   Smokeless Tobacco Never   BP Readings from Last 3 Encounters:  05/23/22 134/76  05/02/22 136/78  04/26/22 115/76   Pulse Readings from Last 3 Encounters:  05/23/22 (!) 103  05/02/22 72  04/26/22 77   Wt Readings from Last 3 Encounters:  05/23/22 293 lb (132.9 kg)  05/02/22 295 lb (133.8 kg)  04/26/22 (!) 303 lb 5.7 oz (137.6 kg)   BMI Readings from Last 3 Encounters:  05/23/22 40.87 kg/m  05/02/22 41.14 kg/m  04/26/22 42.31 kg/m    Allergies  Allergen Reactions   Empagliflozin Rash    Patient reports yeast.    Ibuprofen Other (See Comments)    Stomach pain    Medications Reviewed Today     Reviewed by [name] [name], [facility] (Pharmacist) on 05/27/22 at 1109  Med List Status: <None>   Medication Order Taking? Sig Documenting Provider Last Dose Status Informant  apixaban (ELIQUIS) 5 MG TABS tablet [id number] No Take 1 tablet (5 mg total) by mouth 2 (two) times daily. [name] Dark, MD Taking Active   APIXABAN [name] [name]) VTE STARTER PACK (10MG  AND 5MG ) [id number] No Take as directed on package: start with two-5mg  tablets twice daily for 7 days. On day 8, switch to one-5mg  tablet twice daily. [name] Carrow, MD Taking Active   aspirin EC 81 MG tablet [id number] No Take 81 mg by mouth daily. [provider] Taking Active Self, Pharmacy Records  B-[name] ULTRAFINE III SHORT PEN 31G X 8 MM MISC [id number] No  [provider] Taking Active Self, Pharmacy Records  diclofenac Sodium (VOLTAREN) 1 % GEL [id number] No APPLY 2 GRAMS TOPICALLY 4 TIMES DAILY TO KNEE [name] Dark, MD Taking Active   famotidine (PEPCID) 20 MG tablet [id number] No TAKE 1 TABLET(20 MG) BY MOUTH DAILY  Patient taking differently: Take 20 mg by mouth daily as needed for heartburn.   [name] Bathe, MD Taking Active Self, Pharmacy Records  fluticasone [facility]) 50 MCG/ACT nasal spray [id number] No Place 1 spray into both nostrils daily. [name] Dark, MD Taking Active Self, Pharmacy Records   Fluticasone-Umeclidin-[facility] ELLIPTA) 100-62.5-25 MCG/ACT AEPB [id number]  Inhale 1 puff then rinse mouth, once daily [name] [name] D, MD  Active   HUMALOG 100 UNIT/ML injection [id number] No Inject into the skin See  admin instructions. Per SSI [provider] Taking Active Self, Pharmacy Records  hydroxypropyl methylcellulose / hypromellose (ISOPTO TEARS / GONIOVISC) 2.5 % ophthalmic solution [id number] No Place 2 drops into both eyes as needed for dry eyes. [name] Dark, MD Taking Active Self, Pharmacy Records  LANTUS SOLOSTAR 100 UNIT/ML Solostar Pen [id number] No Inject 20 Units into the skin every morning. [name] Carrow, MD Taking Active   lidocaine (LIDODERM) 5 % [id number] No Place 1 patch onto the skin daily. Remove & Discard patch within 12 hours or as directed by MD Sabas Sous, MD Taking Active   MAGNESIUM-OXIDE 400 (241.3 Mg) MG tablet [id number] No Take 1 tablet by mouth daily. [provider] Taking Active Self, Pharmacy Records  metoprolol tartrate (LOPRESSOR) 25 MG tablet [id number] No Take 0.5 tablets (12.5 mg total) by mouth 2 (two) times daily. [name] Dark, MD Taking Active   nitroGLYCERIN (NITROSTAT) 0.4 MG SL tablet [id number] No Place 1 tablet (0.4 mg total) under the tongue every 5 (five) minutes as needed for chest pain. [name] Bathe, MD Taking Active Self, Pharmacy Records  [facility] ULTRA test strip [id number] No TEST AS DIRECTED 8 TIMES DAILY Allwardt, Alyssa M, PA-C Taking Active   polyethylene glycol (MIRALAX / GLYCOLAX) 17 g packet [id number] No Take 17 g by mouth daily. Osvaldo Shipper, MD Taking Active Self, Pharmacy Records  [facility] 108 [phone]) MCG/ACT inhaler [id number]  Inhale 2 puffs into the lungs every 6 (six) hours as needed for wheezing or shortness of breath. [name] [name] D, MD  Active   rosuvastatin (CRESTOR) 20 MG tablet [id number] No Take 1 tablet (20 mg total) by mouth every evening. [name] Carrow, MD Taking Expired  05/25/22 2359   silver sulfADIAZINE (SSD) 1 % cream [id number] No APPLY PEA SIZED AMOUNT TOPICALLY TO WOUND DAILY [name] Dark, MD Taking Active Self, Pharmacy Records  spironolactone (ALDACTONE) 25 MG tablet [id number] No Take 0.5 tablets (12.5 mg total) by mouth daily. [name] Dark, MD Taking Active   torsemide [facility]) 20 MG tablet [id number] No Take 10 mg by mouth daily. Pt taking 1/2 tab daily [provider] Taking Active             SDOH:  (Social Determinants of Health) assessments and interventions performed: No, done within the year Financial Resource Strain: Low Risk  (11/28/2021)   Overall Financial Resource Strain (CARDIA)    Difficulty of Paying Living Expenses: Not hard at all    SDOH Interventions    Flowsheet Row Telephone from 05/05/2022 in [facility] [employer] [facility] Care Coordination Telephone from 04/28/2022 in [facility] [employer] [facility] Coordination Telephone from 04/16/2022 in [facility] [employer] Care Coordination Clinical Support from 11/28/2021 in [facility] [employer]  SDOH Interventions      Food Insecurity Interventions -- Intervention Not Indicated Intervention Not Indicated Intervention Not Indicated  Housing Interventions Intervention Not Indicated -- -- Intervention Not Indicated  Transportation Interventions -- Intervention Not Indicated Intervention Not Indicated Intervention Not Indicated  Financial Strain Interventions -- -- -- Intervention Not Indicated  Physical Activity Interventions -- -- -- Intervention Not Indicated  Stress Interventions -- -- -- Intervention Not Indicated  Social Connections Interventions -- -- -- Intervention Not Indicated       Medication Assistance: None required.  Patient affirms current coverage meets needs.  Medication Access: Within the past 30 days, how often has patient missed a dose of medication? 0 Is a pillbox or other method used to  improve adherence?  Yes  Factors that may affect medication adherence? no barriers identified Are meds synced by current pharmacy? No  Are meds delivered by current pharmacy? No  Does patient experience delays in picking up medications due to transportation concerns? No   [facility] Services Reviewed: Is patient disadvantaged to use [facility]?: No  Current Rx insurance plan: [facility] Name and location of Current pharmacy:  [facility] [phone] - [place], [place] - [address] AT [facility] [address] [place] [address] Phone: [phone] Fax: [phone]  [facility] - [place], [place] - [address] [place] [address] Phone: [phone] Fax: [phone]  [id number] - [place], [place] - [address] AT [facility] OF [address] [place] [address] Phone: [phone] Fax: [phone]  [id number] - [place], [place] - [address] AT [facility] [address] [place] [address] Phone: [phone] Fax: [phone]  [facility] [address] [place] [address] Phone: [phone] Fax: [phone]  [facility] - [place], [place] - [address] [place] [address] Phone: [phone] Fax: [phone]  [facility] services reviewed with patient today?: Yes  Patient requests to transfer care to [facility]acy?: No  Reason patient declined to change pharmacies: Loyalty to other pharmacy/Patient preference  Compliance/Adherence/Medication fill history: Care Gaps: Due for foot exam, eye exam, kidney eval Annual wellness visit in last year? Yes   Star-Rating Drugs: Lantus last filled 03/04/2022 90 DS Losartan 50 mg last filled 01/14/2022 90 DS Rosuvastatin 20 mg last filled 02/15/2022 90  DS   Assessment/Plan                   Hypertension (BP goal <130/80) -Controlled, not discussed today, not assessed today -Current treatment: Losartan  Appropriate, Effective, Safe, Accessible Metoprolol tartrate  BID Appropriate, Effective, Safe, Accessible -Medications previously tried: none noted  -Current home readings: "normal" -Current dietary habits: see DM -Current exercise habits: walking some at grocery store -Denies hypotensive/hypertensive symptoms -Educated on BP goals and benefits of medications for prevention of heart attack, stroke and kidney damage; Exercise goal of 150 minutes per week; Importance of home blood pressure monitoring; Symptoms of hypotension and importance of maintaining adequate hydration; -Counseled to monitor BP at home a few times per week, document, and provide log at future appointments -Recommended to continue current medication  Hyperlipidemia/CAD: (LDL goal < 70) -Controlled, based on recent LDL March 2023, not assessed today -Current treatment: Rosuvastatin  daily Appropriate, Effective, Safe, Accessible -Medications previously tried: simvastatin  -Current dietary patterns: see DM -Educated on Cholesterol goals;  Benefits of statin for ASCVD risk reduction; Importance of limiting foods high in cholesterol; -Recommended to continue current medication Most recent LDL is well controlled - no adverse effects.  Reports adherence.  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%) 05/26/22 Uncontrolled, still seeing Talmage Coin (Endo)  -Current medications: Humulin U-500  tid with meals Appropriate, Query effective, Lantus 20 units once daily Appropriate, Query effective,  -Medications previously tried: Jardiance (yeast)  -Current home glucose readings fasting glucose: controlled, no logs discussed post prandial glucose: no logs today -Denies hypoglycemic/hyperglycemic symptoms -Current exercise: minimal, does walk some  around grocery store -Educated on A1c and blood sugar goals; Complications of diabetes including kidney damage, retinal  damage, and cardiovascular disease; Prevention and management of hypoglycemic episodes; Benefits of routine self-monitoring of blood sugar; Dietary education regarding carbs, sugars -Counseled to check feet daily and get yearly eye exams -Reports his sugars are much better since recent weight loss.  A1c improved but he is still way above goal.  Medications are affordable due to dual insurance.  Discussed pill packs and patient is interested in this.  DM managed by Endo - I [name] not see any changes at this time.  Continue to focus on dietary and exercise mods. Contact us with any consistent out of range glucose.   Initial Visit - March 2023 I believe his intake of sodas alone is a large part of elevated glucose.  He is working on switching to the zero sugar sodas.  Also discussed carbs in other dietary sources.  He was concerned that his sugar would drop to low.  Explained that we could decrease insulin if sugar started dropping too low. Agreeable to work on sodas, will follow closely.  Recommend recheck A1c in 3 months. -Current meal patterns:  breakfast: eggs, grits, sausage links  lunch: salad, [place] sandwich  dinner: baked chicken, rice w/ broccoli drinks: sodas (4-6 12oz per day), water  Heart Failure (Goal: manage symptoms and prevent exacerbations) 05/26/22 -Controlled, recent hospital stay -Last ejection fraction: 50-55% -Current treatment: Metoprolol tartrate  BID Appropriate, Effective, Safe, Accessible Spironolactone 12.5mg  dailyAppropriate, Effective, Safe, Accessible Torsemide  Appropriate, Effective, Safe, Accessible -Medications previously tried: none noted  -Current home BP/HR readings: "normal" -Current dietary habits: working on processed foods and salt intake -Educated on Benefits of medications for managing symptoms and prolonging  life Importance of weighing daily; if you gain more than 3 pounds in one day or 5 pounds in one week, contact providers. -Monitoring his weight - he has lost a bunch of weight recently.  Aware of importance of monitoring for weight gain to avoid exacerbations.  BP controlled.  He was having difficulty keeping up with his medications.  We set him up with pill packs through [facility] today.  Requests all meds except his insulin to be delivered through [facility].         Willa Frater, PharmD Clinical Pharmacist  [facility] [phone]

## 2022-05-26 NOTE — Telephone Encounter (Signed)
Staff message asking refills Treegy and Proair to [facility]

## 2022-05-27 ENCOUNTER — Other Ambulatory Visit: Payer: Self-pay

## 2022-05-27 ENCOUNTER — Other Ambulatory Visit: Payer: Self-pay | Admitting: *Deleted

## 2022-05-27 DIAGNOSIS — E1142 Type 2 diabetes mellitus with diabetic polyneuropathy: Secondary | ICD-10-CM | POA: Diagnosis not present

## 2022-05-27 DIAGNOSIS — Z7982 Long term (current) use of aspirin: Secondary | ICD-10-CM | POA: Diagnosis not present

## 2022-05-27 DIAGNOSIS — Z7901 Long term (current) use of anticoagulants: Secondary | ICD-10-CM | POA: Diagnosis not present

## 2022-05-27 DIAGNOSIS — I083 Combined rheumatic disorders of mitral, aortic and tricuspid valves: Secondary | ICD-10-CM | POA: Diagnosis not present

## 2022-05-27 DIAGNOSIS — Q441 Other congenital malformations of gallbladder: Secondary | ICD-10-CM

## 2022-05-27 DIAGNOSIS — K7460 Unspecified cirrhosis of liver: Secondary | ICD-10-CM | POA: Diagnosis not present

## 2022-05-27 DIAGNOSIS — G4733 Obstructive sleep apnea (adult) (pediatric): Secondary | ICD-10-CM | POA: Diagnosis not present

## 2022-05-27 DIAGNOSIS — I2510 Atherosclerotic heart disease of native coronary artery without angina pectoris: Secondary | ICD-10-CM | POA: Diagnosis not present

## 2022-05-27 DIAGNOSIS — I130 Hypertensive heart and chronic kidney disease with heart failure and stage 1 through stage 4 chronic kidney disease, or unspecified chronic kidney disease: Secondary | ICD-10-CM | POA: Diagnosis not present

## 2022-05-27 DIAGNOSIS — Z951 Presence of aortocoronary bypass graft: Secondary | ICD-10-CM | POA: Diagnosis not present

## 2022-05-27 DIAGNOSIS — N179 Acute kidney failure, unspecified: Secondary | ICD-10-CM | POA: Diagnosis not present

## 2022-05-27 DIAGNOSIS — I5032 Chronic diastolic (congestive) heart failure: Secondary | ICD-10-CM | POA: Diagnosis not present

## 2022-05-27 DIAGNOSIS — N1831 Chronic kidney disease, stage 3a: Secondary | ICD-10-CM | POA: Diagnosis not present

## 2022-05-27 DIAGNOSIS — E1122 Type 2 diabetes mellitus with diabetic chronic kidney disease: Secondary | ICD-10-CM | POA: Diagnosis not present

## 2022-05-27 DIAGNOSIS — Z794 Long term (current) use of insulin: Secondary | ICD-10-CM | POA: Diagnosis not present

## 2022-05-27 DIAGNOSIS — I2699 Other pulmonary embolism without acute cor pulmonale: Secondary | ICD-10-CM | POA: Diagnosis not present

## 2022-05-27 DIAGNOSIS — G510 Bell's palsy: Secondary | ICD-10-CM | POA: Diagnosis not present

## 2022-05-27 DIAGNOSIS — Z87891 Personal history of nicotine dependence: Secondary | ICD-10-CM | POA: Diagnosis not present

## 2022-05-27 DIAGNOSIS — E876 Hypokalemia: Secondary | ICD-10-CM | POA: Diagnosis not present

## 2022-05-27 DIAGNOSIS — Z7951 Long term (current) use of inhaled steroids: Secondary | ICD-10-CM | POA: Diagnosis not present

## 2022-05-27 DIAGNOSIS — I50810 Right heart failure, unspecified: Secondary | ICD-10-CM | POA: Diagnosis not present

## 2022-05-27 DIAGNOSIS — Z79899 Other long term (current) drug therapy: Secondary | ICD-10-CM | POA: Diagnosis not present

## 2022-05-27 DIAGNOSIS — Z8616 Personal history of COVID-19: Secondary | ICD-10-CM | POA: Diagnosis not present

## 2022-05-28 ENCOUNTER — Other Ambulatory Visit: Payer: Self-pay

## 2022-05-28 ENCOUNTER — Telehealth: Payer: Self-pay

## 2022-05-28 MED ORDER — METOPROLOL TARTRATE 25 MG PO TABS: 12.5000 mg | ORAL_TABLET | Freq: Two times a day (BID) | ORAL | 1 refills | Status: DC

## 2022-05-28 MED ORDER — ROSUVASTATIN CALCIUM 20 MG PO TABS: 20.0000 mg | ORAL_TABLET | Freq: Every evening | ORAL | 1 refills | Status: DC

## 2022-05-28 MED ORDER — SPIRONOLACTONE 25 MG PO TABS: 12.5000 mg | ORAL_TABLET | Freq: Every day | ORAL | 3 refills | Status: DC

## 2022-05-28 MED ORDER — APIXABAN 5 MG PO TABS: 5.0000 mg | ORAL_TABLET | Freq: Two times a day (BID) | ORAL | 1 refills | Status: DC

## 2022-05-28 NOTE — Patient Outreach (Signed)
  Care Coordination   Follow Up Visit Note   05/28/2022 Name: [name] [name] MRN: [id number] DOB: Dec 23, 1955   Incoming call from patient. He states that he is doing well. He has been set up with [facility] pharmacy to assist with med mgmt. He voices that he spoke with pharmacist and will start getting most of his meds delivered through [facility]. Patient voices he is in need on Eliquis-took last dose yesterday and is supposed to get med delivered today via [facility] and wanting to know what time meds will be delivered. Provided patient with [facility] pharmacy phone number to call and inquire about delivery time. Also, provided patient with assigned pharmacist-Christian contact info to call for any med related questions or concerns. He voiced understanding. No further RN CM needs or concerns at this time.      Care Coordination Interventions:  Yes, provided   Follow up plan:  Assigned RN CM will follow up with patient as previously scheduled.    Encounter Outcome:  Pt. Visit Completed   Alessandra Grout [facility] Care Management Care Management Community Coordinator Direct Phone: [phone] Toll Free: [phone] Fax: [phone]

## 2022-05-29 ENCOUNTER — Telehealth: Payer: Self-pay | Admitting: *Deleted

## 2022-05-29 ENCOUNTER — Ambulatory Visit: Payer: 59 | Admitting: Family Medicine

## 2022-05-29 DIAGNOSIS — L603 Nail dystrophy: Secondary | ICD-10-CM | POA: Diagnosis not present

## 2022-05-29 DIAGNOSIS — L84 Corns and callosities: Secondary | ICD-10-CM | POA: Diagnosis not present

## 2022-05-29 DIAGNOSIS — I739 Peripheral vascular disease, unspecified: Secondary | ICD-10-CM | POA: Diagnosis not present

## 2022-05-29 DIAGNOSIS — E1151 Type 2 diabetes mellitus with diabetic peripheral angiopathy without gangrene: Secondary | ICD-10-CM | POA: Diagnosis not present

## 2022-05-29 MED ORDER — FAMOTIDINE 20 MG PO TABS: 20.0000 mg | ORAL_TABLET | Freq: Every day | ORAL | 2 refills | Status: DC | PRN

## 2022-05-29 MED ORDER — TORSEMIDE 20 MG PO TABS: 10.0000 mg | ORAL_TABLET | Freq: Every day | ORAL | 3 refills | Status: DC

## 2022-05-29 NOTE — Telephone Encounter (Signed)
Hello Dr. Anne Fu.,   We are setting patient up with delivery and synchronization of all his meds.  I see you prescribe his Torsemide and Famotidine.  Would you mind calling in new rx for these to [facility] so that we can get him started?   Willa Frater, PharmD  Clinical Pharmacist  [facility]  [phone]    RXs sent electronically into [facility] as requested.

## 2022-05-30 ENCOUNTER — Encounter: Payer: Self-pay | Admitting: Family Medicine

## 2022-05-30 ENCOUNTER — Ambulatory Visit (INDEPENDENT_AMBULATORY_CARE_PROVIDER_SITE_OTHER): Payer: 59 | Admitting: Family Medicine

## 2022-05-30 VITALS — BP 138/77 | HR 94 | Temp 97.1°F | Resp 16 | Ht 71.0 in | Wt 285.2 lb

## 2022-05-30 DIAGNOSIS — K5900 Constipation, unspecified: Secondary | ICD-10-CM | POA: Diagnosis not present

## 2022-05-30 DIAGNOSIS — I50810 Right heart failure, unspecified: Secondary | ICD-10-CM | POA: Diagnosis not present

## 2022-05-30 DIAGNOSIS — R5381 Other malaise: Secondary | ICD-10-CM | POA: Diagnosis not present

## 2022-05-30 DIAGNOSIS — I10 Essential (primary) hypertension: Secondary | ICD-10-CM | POA: Diagnosis not present

## 2022-05-30 DIAGNOSIS — Q441 Other congenital malformations of gallbladder: Secondary | ICD-10-CM

## 2022-05-30 LAB — GAMMA GT: GGT: 84 U/L — ABNORMAL HIGH (ref 7–51)

## 2022-05-30 MED ORDER — LINACLOTIDE 290 MCG PO CAPS: 290.0000 ug | ORAL_CAPSULE | Freq: Every day | ORAL | 0 refills | Status: DC

## 2022-05-30 NOTE — Assessment & Plan Note (Signed)
Blood pressure at goal on spironolactone 25 mg daily and Metroprolol tartrate 12.5 mg twice daily.

## 2022-05-30 NOTE — Assessment & Plan Note (Signed)
Multifactorial in setting of CHF and diabetic neuropathy.  Does need some assistance with ADLs.  Will place referral to home health.

## 2022-05-30 NOTE — Patient Instructions (Signed)
It was very nice to see you today!  We will check blood work for your gallbladder.  Please try the Linzess for your constipation.  We will see if home health can come out to help you with clipping your nails.   Take care, Dr [name]  PLEASE NOTE:  If you had any lab tests, please let us know if you have not heard back within a few days. You may see your results on mychart before we have a chance to review them but we will give you a call once they are reviewed by [place].   If we ordered any referrals today, please let us know if you have not heard from their office within the next week.   If you had any urgent prescriptions sent in today, please check with the pharmacy within an hour of our visit to make sure the prescription was transmitted appropriately.   Please try these tips to maintain a healthy lifestyle:  Eat at least 3 REAL meals and 1-2 snacks per day.  Aim for no more than 5 hours between eating.  If you eat breakfast, please do so within one hour of getting up.   Each meal should contain half fruits/vegetables, one quarter protein, and one quarter carbs (no bigger than a computer mouse)  Cut down on sweet beverages. This includes juice, soda, and sweet tea.   Drink at least 1 glass of water with each meal and aim for at least 8 glasses per day  Exercise at least 150 minutes every week.

## 2022-05-30 NOTE — Assessment & Plan Note (Addendum)
He has chronic edema in bilateral lower extremities but no signs of volume overload today.  He is down about 8 pounds over the last week or so.  He does have upcoming appointment with cardiology.  Will continue current regimen per cardiology with spironolactone 25 mg daily, torsemide 10 mg daily, and metoprolol tartrate 12.5 mg twice daily.   Will refer him to home health for assistance with medication management and ADLs as well.

## 2022-05-30 NOTE — Progress Notes (Signed)
   [name] [name] is a 67 y.o. male who presents today for an office visit.  Assessment/Plan:  Chronic Problems Addressed Today: RVF (right ventricular failure) (HCC) He has chronic edema in bilateral lower extremities but no signs of volume overload today.  He is down about 8 pounds over the last week or so.  He does have upcoming appointment with cardiology.  Will continue current regimen per cardiology with spironolactone 25 mg daily, torsemide 10 mg daily, and metoprolol tartrate 12.5 mg twice daily.   Will refer him to home health for assistance with medication management and ADLs as well.  HTN (hypertension) Blood pressure at goal on spironolactone 25 mg daily and Metroprolol tartrate 12.5 mg twice daily.  Constipation Has not had much success recently with MiraLAX.  Encouraged good oral hydration.  Will try Linzess.  If this continues to be an issue will need referral to GI.  Debility Multifactorial in setting of CHF and diabetic neuropathy.  Does need some assistance with ADLs.  Will place referral to home health.   Elevated GGT Recheck GGT today.  LFTs on c-Met from a couple weeks ago were back to normal.  Has right upper quadrant ultrasound pending.    Subjective:  HPI:  See A/p for status of chronic conditions.   Patient was last seen here about a month ago. Since our last visit he has seen a few specialists including ID and endocrinology.  He has upcoming appoint with cardiology as well.  Infectious disease did not see any signs of recurrent infection and advised him to follow-up as needed.  He will be following up with cardiology soon to discuss his diuretics and heart failure management.  Overall he feels well today though is having some issues with constipation for the last week or so.  He has been trying MiraLAX which was working for quite a while however seems to be less effective last week or so.  He is having straining to have a bowel movement.  He has been on pills to  help with constipation in the past but does not remember the name of them.  He would like some assistance with trimming his nails at home.  He was found to have elevated GGT at his last office visit here.  We ordered a right upper quadrant ultrasound which will be evaluated next week.       Objective:  Physical Exam: BP 138/77   Pulse 94   Temp (!) 97.1 F (36.2 C) (Temporal)   Resp 16   Ht  (1.803 m)   Wt 285 lb 3.2 oz (129.4 kg)   SpO2 92%   BMI 39.78 kg/m   Wt Readings from Last 3 Encounters:  05/30/22 285 lb 3.2 oz (129.4 kg)  05/23/22 293 lb (132.9 kg)  05/02/22 295 lb (133.8 kg)    Gen: No acute distress, resting comfortably CV: Regular rate and rhythm with no murmurs appreciated Pulm: Normal work of breathing, clear to auscultation bilaterally with no crackles, wheezes, or rhonchi MSK: Bilateral legs with 3+ edema. Neuro: Grossly normal, moves all extremities Psych: Normal affect and thought content      Emslee Lopezmartinez M. [name], MD 05/30/2022 11:16 AM

## 2022-05-30 NOTE — Assessment & Plan Note (Signed)
Has not had much success recently with MiraLAX.  Encouraged good oral hydration.  Will try Linzess.  If this continues to be an issue will need referral to GI.

## 2022-06-03 ENCOUNTER — Ambulatory Visit: Payer: Self-pay

## 2022-06-03 ENCOUNTER — Ambulatory Visit (HOSPITAL_BASED_OUTPATIENT_CLINIC_OR_DEPARTMENT_OTHER)
Admission: RE | Admit: 2022-06-03 | Discharge: 2022-06-03 | Disposition: A | Discharge status: Home / Self Care | Payer: 59 | Source: Ambulatory Visit | Attending: Family Medicine | Admitting: Family Medicine

## 2022-06-03 DIAGNOSIS — R945 Abnormal results of liver function studies: Secondary | ICD-10-CM | POA: Diagnosis not present

## 2022-06-03 DIAGNOSIS — Q441 Other congenital malformations of gallbladder: Secondary | ICD-10-CM | POA: Insufficient documentation

## 2022-06-03 NOTE — Patient Instructions (Signed)
Visit Information  Thank you for taking time to visit with me today. Please don't hesitate to contact me if I can be of assistance to you.   Following are the goals we discussed today:   Goals Addressed             This Visit's Progress    Heart Failure and Diabetes Management       Patient Goals/Self Care Activities: -Patient/Caregiver will self-administer medications as prescribed as evidenced by self-report/primary caregiver report  -Patient/Caregiver will attend all scheduled provider appointments as evidenced by clinician review of documented attendance to scheduled appointments and patient/caregiver report -Patient/Caregiver will call provider office for new concerns or questions as evidenced by review of documented incoming telephone call notes and patient report  -wear comfortable, well-fitting shoes -Weigh daily and record (notify MD with 3 lb weight gain over night or 5 lb in a week) -Follow CHF Action Plan -Adhere to low sodium diet   -Patient weight 281 lbs.  Discussed heart failure management. -Patient connected with pharmacist.  Home Health also assisting patient with medication management -Patient back on torsemide.   -Patient to contact [facility] for compression hose.    Interventions Today    Flowsheet Row Most Recent Value  Chronic Disease   Chronic disease during today's visit Congestive Heart Failure (CHF)  General Interventions   General Interventions Discussed/Reviewed General Interventions Reviewed, Doctor Visits, Durable Medical Equipment (DME)  Doctor Visits Discussed/Reviewed Doctor Visits Reviewed  Durable Medical Equipment (DME) Other  [compression hose]  Education Interventions   Education Provided Provided Education  Provided Verbal Education On Medication, Nutrition, Other  [Heart failure and weight management]  Nutrition Interventions   Nutrition Discussed/Reviewed Nutrition Reviewed, Decreasing salt, Portion sizes,  Fluid intake, Adding fruits and vegetables  Pharmacy Interventions   Pharmacy Dicussed/Reviewed Pharmacy Topics Reviewed, Referral to Pharmacist  [patient connected with pharmacist.  Medications ordered.  Pill packages to start in June per patient.]              Our next appointment is by telephone on 06/11/22 at 1:00 pm  Please call the care guide team at [phone] if you need to cancel or reschedule your appointment.   If you are experiencing a Mental Health or Behavioral Health Crisis or need someone to talk to, please call the Suicide and Crisis Lifeline: 988   Patient verbalizes understanding of instructions and care plan provided today and agrees to view in MyChart. Active MyChart status and patient understanding of how to access instructions and care plan via MyChart confirmed with patient.     The patient has been provided with contact information for the care management team and has been advised to call with any health related questions or concerns.   Bary Leriche, RN, MSN [facility] Care Management Care Management Coordinator Direct Line [phone]

## 2022-06-03 NOTE — Patient Outreach (Addendum)
  Care Coordination   Follow Up Visit Note   06/03/2022 Name: [name] [name] MRN: [id number] DOB: 1955-10-24  [name] [name] is a 67 y.o. year old male who sees [name] Dark, MD for primary care. I spoke with  [name] [name] by phone today. Patient doing good. Had scan for gallstones today.    What matters to the patients health and wellness today?  Health management    Goals Addressed             This Visit's Progress    Heart Failure and Diabetes Management       Patient Goals/Self Care Activities: -Patient/Caregiver will self-administer medications as prescribed as evidenced by self-report/primary caregiver report  -Patient/Caregiver will attend all scheduled provider appointments as evidenced by clinician review of documented attendance to scheduled appointments and patient/caregiver report -Patient/Caregiver will call provider office for new concerns or questions as evidenced by review of documented incoming telephone call notes and patient report  -wear comfortable, well-fitting shoes -Weigh daily and record (notify MD with 3 lb weight gain over night or 5 lb in a week) -Follow CHF Action Plan -Adhere to low sodium diet   -Patient weight 281 lbs.  Discussed heart failure management. -Patient connected with pharmacist.  Home Health also assisting patient with medication management -Patient back on torsemide.   -Patient to contact [facility] for compression hose.    Interventions Today    Flowsheet Row Most Recent Value  Chronic Disease   Chronic disease during today's visit Congestive Heart Failure (CHF)  General Interventions   General Interventions Discussed/Reviewed General Interventions Reviewed, Doctor Visits, Durable Medical Equipment (DME)  Doctor Visits Discussed/Reviewed Doctor Visits Reviewed  Durable Medical Equipment (DME) Other  [compression hose]  Education Interventions   Education Provided Provided Education  Provided  Verbal Education On Medication, Nutrition, Other  [Heart failure and weight management]  Nutrition Interventions   Nutrition Discussed/Reviewed Nutrition Reviewed, Decreasing salt, Portion sizes, Fluid intake, Adding fruits and vegetables  Pharmacy Interventions   Pharmacy Dicussed/Reviewed Pharmacy Topics Reviewed, Referral to Pharmacist  [patient connected with pharmacist.  Medications ordered.  Pill packages to start in June per patient.]              SDOH assessments and interventions completed:  Yes     Care Coordination Interventions:  Yes, provided   Follow up plan: Follow up call scheduled for May 1st    Encounter Outcome:  Pt. Visit Completed   Bary Leriche, RN, MSN [facility] Care Management Care Management Coordinator Direct Line [phone]

## 2022-06-04 NOTE — Progress Notes (Signed)
Gallbladder levels are returning back to normal. His ultrasound was normal as well. Do not need to do any further testing at this point. He should let us know if he develops any symptoms including abdominal pain, nausea, vomiting, etc.   [name] [name]. [name], MD 06/04/2022 10:29 AM

## 2022-06-05 ENCOUNTER — Ambulatory Visit: Payer: 59 | Admitting: Audiologist

## 2022-06-06 DIAGNOSIS — E1122 Type 2 diabetes mellitus with diabetic chronic kidney disease: Secondary | ICD-10-CM | POA: Diagnosis not present

## 2022-06-06 DIAGNOSIS — I2510 Atherosclerotic heart disease of native coronary artery without angina pectoris: Secondary | ICD-10-CM | POA: Diagnosis not present

## 2022-06-06 DIAGNOSIS — G510 Bell's palsy: Secondary | ICD-10-CM | POA: Diagnosis not present

## 2022-06-06 DIAGNOSIS — Z8616 Personal history of COVID-19: Secondary | ICD-10-CM | POA: Diagnosis not present

## 2022-06-06 DIAGNOSIS — I130 Hypertensive heart and chronic kidney disease with heart failure and stage 1 through stage 4 chronic kidney disease, or unspecified chronic kidney disease: Secondary | ICD-10-CM | POA: Diagnosis not present

## 2022-06-06 DIAGNOSIS — I2699 Other pulmonary embolism without acute cor pulmonale: Secondary | ICD-10-CM | POA: Diagnosis not present

## 2022-06-06 DIAGNOSIS — I5032 Chronic diastolic (congestive) heart failure: Secondary | ICD-10-CM | POA: Diagnosis not present

## 2022-06-06 DIAGNOSIS — Z794 Long term (current) use of insulin: Secondary | ICD-10-CM | POA: Diagnosis not present

## 2022-06-06 DIAGNOSIS — I50810 Right heart failure, unspecified: Secondary | ICD-10-CM | POA: Diagnosis not present

## 2022-06-06 DIAGNOSIS — N179 Acute kidney failure, unspecified: Secondary | ICD-10-CM | POA: Diagnosis not present

## 2022-06-06 DIAGNOSIS — Z79899 Other long term (current) drug therapy: Secondary | ICD-10-CM | POA: Diagnosis not present

## 2022-06-06 DIAGNOSIS — Z87891 Personal history of nicotine dependence: Secondary | ICD-10-CM | POA: Diagnosis not present

## 2022-06-06 DIAGNOSIS — Z7951 Long term (current) use of inhaled steroids: Secondary | ICD-10-CM | POA: Diagnosis not present

## 2022-06-06 DIAGNOSIS — Z7982 Long term (current) use of aspirin: Secondary | ICD-10-CM | POA: Diagnosis not present

## 2022-06-06 DIAGNOSIS — Z951 Presence of aortocoronary bypass graft: Secondary | ICD-10-CM | POA: Diagnosis not present

## 2022-06-06 DIAGNOSIS — I083 Combined rheumatic disorders of mitral, aortic and tricuspid valves: Secondary | ICD-10-CM | POA: Diagnosis not present

## 2022-06-06 DIAGNOSIS — K7460 Unspecified cirrhosis of liver: Secondary | ICD-10-CM | POA: Diagnosis not present

## 2022-06-06 DIAGNOSIS — E1142 Type 2 diabetes mellitus with diabetic polyneuropathy: Secondary | ICD-10-CM | POA: Diagnosis not present

## 2022-06-06 DIAGNOSIS — G4733 Obstructive sleep apnea (adult) (pediatric): Secondary | ICD-10-CM | POA: Diagnosis not present

## 2022-06-06 DIAGNOSIS — E876 Hypokalemia: Secondary | ICD-10-CM | POA: Diagnosis not present

## 2022-06-06 DIAGNOSIS — Z7901 Long term (current) use of anticoagulants: Secondary | ICD-10-CM | POA: Diagnosis not present

## 2022-06-06 DIAGNOSIS — N1831 Chronic kidney disease, stage 3a: Secondary | ICD-10-CM | POA: Diagnosis not present

## 2022-06-08 DIAGNOSIS — Z794 Long term (current) use of insulin: Secondary | ICD-10-CM | POA: Diagnosis not present

## 2022-06-08 DIAGNOSIS — E1142 Type 2 diabetes mellitus with diabetic polyneuropathy: Secondary | ICD-10-CM | POA: Diagnosis not present

## 2022-06-08 DIAGNOSIS — K5900 Constipation, unspecified: Secondary | ICD-10-CM | POA: Diagnosis not present

## 2022-06-08 DIAGNOSIS — K769 Liver disease, unspecified: Secondary | ICD-10-CM | POA: Diagnosis not present

## 2022-06-08 DIAGNOSIS — J449 Chronic obstructive pulmonary disease, unspecified: Secondary | ICD-10-CM | POA: Diagnosis not present

## 2022-06-08 DIAGNOSIS — Z7982 Long term (current) use of aspirin: Secondary | ICD-10-CM | POA: Diagnosis not present

## 2022-06-08 DIAGNOSIS — M1990 Unspecified osteoarthritis, unspecified site: Secondary | ICD-10-CM | POA: Diagnosis not present

## 2022-06-08 DIAGNOSIS — Z7901 Long term (current) use of anticoagulants: Secondary | ICD-10-CM | POA: Diagnosis not present

## 2022-06-08 DIAGNOSIS — Z87891 Personal history of nicotine dependence: Secondary | ICD-10-CM | POA: Diagnosis not present

## 2022-06-08 DIAGNOSIS — I110 Hypertensive heart disease with heart failure: Secondary | ICD-10-CM | POA: Diagnosis not present

## 2022-06-08 DIAGNOSIS — I50810 Right heart failure, unspecified: Secondary | ICD-10-CM | POA: Diagnosis not present

## 2022-06-08 DIAGNOSIS — Z556 Problems related to health literacy: Secondary | ICD-10-CM | POA: Diagnosis not present

## 2022-06-08 DIAGNOSIS — J302 Other seasonal allergic rhinitis: Secondary | ICD-10-CM | POA: Diagnosis not present

## 2022-06-08 DIAGNOSIS — G4733 Obstructive sleep apnea (adult) (pediatric): Secondary | ICD-10-CM | POA: Diagnosis not present

## 2022-06-08 DIAGNOSIS — E785 Hyperlipidemia, unspecified: Secondary | ICD-10-CM | POA: Diagnosis not present

## 2022-06-08 DIAGNOSIS — I071 Rheumatic tricuspid insufficiency: Secondary | ICD-10-CM | POA: Diagnosis not present

## 2022-06-08 DIAGNOSIS — Z9181 History of falling: Secondary | ICD-10-CM | POA: Diagnosis not present

## 2022-06-08 DIAGNOSIS — K7460 Unspecified cirrhosis of liver: Secondary | ICD-10-CM | POA: Diagnosis not present

## 2022-06-08 DIAGNOSIS — I5032 Chronic diastolic (congestive) heart failure: Secondary | ICD-10-CM | POA: Diagnosis not present

## 2022-06-08 DIAGNOSIS — I2510 Atherosclerotic heart disease of native coronary artery without angina pectoris: Secondary | ICD-10-CM | POA: Diagnosis not present

## 2022-06-08 DIAGNOSIS — E1151 Type 2 diabetes mellitus with diabetic peripheral angiopathy without gangrene: Secondary | ICD-10-CM | POA: Diagnosis not present

## 2022-06-09 ENCOUNTER — Telehealth: Payer: Self-pay | Admitting: Family Medicine

## 2022-06-09 NOTE — Telephone Encounter (Signed)
Unable to reach pt , lvm for to cb and schedule a f/u

## 2022-06-09 NOTE — Telephone Encounter (Signed)
Patient states he has been dealing with ulcers on both legs. States this was discussed during last OV and are not open wounds. Pt would like to know if PCP would recommend an ointment or something to help.

## 2022-06-10 ENCOUNTER — Encounter: Payer: 59 | Admitting: Pharmacist

## 2022-06-10 DIAGNOSIS — Z79899 Other long term (current) drug therapy: Secondary | ICD-10-CM | POA: Diagnosis not present

## 2022-06-10 DIAGNOSIS — J45998 Other asthma: Secondary | ICD-10-CM | POA: Diagnosis not present

## 2022-06-10 DIAGNOSIS — K7460 Unspecified cirrhosis of liver: Secondary | ICD-10-CM | POA: Diagnosis not present

## 2022-06-10 DIAGNOSIS — N1831 Chronic kidney disease, stage 3a: Secondary | ICD-10-CM | POA: Diagnosis not present

## 2022-06-10 DIAGNOSIS — Z87891 Personal history of nicotine dependence: Secondary | ICD-10-CM | POA: Diagnosis not present

## 2022-06-10 DIAGNOSIS — E876 Hypokalemia: Secondary | ICD-10-CM | POA: Diagnosis not present

## 2022-06-10 DIAGNOSIS — G510 Bell's palsy: Secondary | ICD-10-CM | POA: Diagnosis not present

## 2022-06-10 DIAGNOSIS — Z7982 Long term (current) use of aspirin: Secondary | ICD-10-CM | POA: Diagnosis not present

## 2022-06-10 DIAGNOSIS — I130 Hypertensive heart and chronic kidney disease with heart failure and stage 1 through stage 4 chronic kidney disease, or unspecified chronic kidney disease: Secondary | ICD-10-CM | POA: Diagnosis not present

## 2022-06-10 DIAGNOSIS — G4733 Obstructive sleep apnea (adult) (pediatric): Secondary | ICD-10-CM | POA: Diagnosis not present

## 2022-06-10 DIAGNOSIS — I083 Combined rheumatic disorders of mitral, aortic and tricuspid valves: Secondary | ICD-10-CM | POA: Diagnosis not present

## 2022-06-10 DIAGNOSIS — I2510 Atherosclerotic heart disease of native coronary artery without angina pectoris: Secondary | ICD-10-CM | POA: Diagnosis not present

## 2022-06-10 DIAGNOSIS — I5032 Chronic diastolic (congestive) heart failure: Secondary | ICD-10-CM | POA: Diagnosis not present

## 2022-06-10 DIAGNOSIS — I2699 Other pulmonary embolism without acute cor pulmonale: Secondary | ICD-10-CM | POA: Diagnosis not present

## 2022-06-10 DIAGNOSIS — Z8616 Personal history of COVID-19: Secondary | ICD-10-CM | POA: Diagnosis not present

## 2022-06-10 DIAGNOSIS — E1142 Type 2 diabetes mellitus with diabetic polyneuropathy: Secondary | ICD-10-CM | POA: Diagnosis not present

## 2022-06-10 DIAGNOSIS — Z951 Presence of aortocoronary bypass graft: Secondary | ICD-10-CM | POA: Diagnosis not present

## 2022-06-10 DIAGNOSIS — N179 Acute kidney failure, unspecified: Secondary | ICD-10-CM | POA: Diagnosis not present

## 2022-06-10 DIAGNOSIS — Z7901 Long term (current) use of anticoagulants: Secondary | ICD-10-CM | POA: Diagnosis not present

## 2022-06-10 DIAGNOSIS — Z794 Long term (current) use of insulin: Secondary | ICD-10-CM | POA: Diagnosis not present

## 2022-06-10 DIAGNOSIS — Z7951 Long term (current) use of inhaled steroids: Secondary | ICD-10-CM | POA: Diagnosis not present

## 2022-06-10 DIAGNOSIS — I50810 Right heart failure, unspecified: Secondary | ICD-10-CM | POA: Diagnosis not present

## 2022-06-10 DIAGNOSIS — E1122 Type 2 diabetes mellitus with diabetic chronic kidney disease: Secondary | ICD-10-CM | POA: Diagnosis not present

## 2022-06-11 ENCOUNTER — Ambulatory Visit: Payer: Self-pay

## 2022-06-11 ENCOUNTER — Ambulatory Visit: Payer: 59 | Admitting: Family Medicine

## 2022-06-11 NOTE — Patient Instructions (Signed)
Visit Information  Thank you for taking time to visit with me today. Please don't hesitate to contact me if I can be of assistance to you.   Following are the goals we discussed today:   Goals Addressed             This Visit's Progress    Heart Failure and Diabetes Management       Patient Goals/Self Care Activities: -Patient/Caregiver will self-administer medications as prescribed as evidenced by self-report/primary caregiver report  -Patient/Caregiver will attend all scheduled provider appointments as evidenced by clinician review of documented attendance to scheduled appointments and patient/caregiver report -Patient/Caregiver will call provider office for new concerns or questions as evidenced by review of documented incoming telephone call notes and patient report  -wear comfortable, well-fitting shoes -Weigh daily and record (notify MD with 3 lb weight gain over night or 5 lb in a week) -Follow CHF Action Plan -Adhere to low sodium diet   -Patient weight 278 lbs.  Discussed heart failure management. -[name] also assisting patient with medication management and patient states that possible leg wraps to help.  Patient currently wearing compression hose and patient feels like it is helping.          Our next appointment is by telephone on 06/23/22 at 1200 pm  Please call the care guide team at [phone] if you need to cancel or reschedule your appointment.   If you are experiencing a Mental Health or Behavioral Health Crisis or need someone to talk to, please call the Suicide and Crisis Lifeline: 988   Patient verbalizes understanding of instructions and care plan provided today and agrees to view in MyChart. Active MyChart status and patient understanding of how to access instructions and care plan via MyChart confirmed with patient.     The patient has been provided with contact information for the care management team and has been advised to call with  any health related questions or concerns.   Bary Leriche, RN, MSN [facility] Care Management Care Management Coordinator Direct Line [phone]

## 2022-06-11 NOTE — Patient Outreach (Signed)
  Care Coordination   Follow Up Visit Note   06/11/2022 Name: [name] [name] MRN: [id number] DOB: [birth year]  [name] [name] is a 67 y.o. year old male who sees [name], MD for primary care. I spoke with  [name] by phone today. Patient reports doing fine today. He has appointment with PCP on Friday. Transportation set per patient.  What matters to the patients health and wellness today?  Managing health    Goals Addressed             This Visit's Progress    Heart Failure and Diabetes Management       Patient Goals/Self Care Activities: -Patient/Caregiver will self-administer medications as prescribed as evidenced by self-report/primary caregiver report  -Patient/Caregiver will attend all scheduled provider appointments as evidenced by clinician review of documented attendance to scheduled appointments and patient/caregiver report -Patient/Caregiver will call provider office for new concerns or questions as evidenced by review of documented incoming telephone call notes and patient report  -wear comfortable, well-fitting shoes -Weigh daily and record (notify MD with 3 lb weight gain over night or 5 lb in a week) -Follow CHF Action Plan -Adhere to low sodium diet   -Patient weight 278 lbs.  Discussed heart failure management. -[name] also assisting patient with medication management and patient states that possible leg wraps to help.  Patient currently wearing compression hose and patient feels like it is helping.          SDOH assessments and interventions completed:  Yes     Care Coordination Interventions:  Yes, provided   Follow up plan: Follow up call scheduled for May 13th    Encounter Outcome:  Pt. Visit Completed   Bary Leriche, RN, MSN [facility] Care Management Care Management Coordinator Direct Line [phone]

## 2022-06-12 ENCOUNTER — Ambulatory Visit: Payer: 59 | Attending: Audiologist | Admitting: Audiologist

## 2022-06-12 DIAGNOSIS — H903 Sensorineural hearing loss, bilateral: Secondary | ICD-10-CM | POA: Diagnosis not present

## 2022-06-12 NOTE — Procedures (Signed)
  Outpatient Audiology and [facility] [address] [place], [place]  [address] [phone]  AUDIOLOGICAL  EVALUATION  NAME: [name] [name]     DOB:   18-Feb-1955      MRN: [id number]                                                                                     DATE: 06/12/2022     REFERENT: [name], MD STATUS: Outpatient DIAGNOSIS: Sensorineural Hearing Loss     History: [name] was seen for an audiological evaluation. [name] was accompanied to the appointment by his wife. [name] is receiving a hearing evaluation due to concerns for difficulty understanding people. [name] cannot understand people from a distance. This difficulty began gradually.  No pain or pressure reported in either ear. Tinnitus denied in both ears.  Medical history positive for diabetic neuropathy which is a risk factor for hearing loss. No other relevant case history reported.   Evaluation:  Otoscopy showed a clear view of the tympanic membranes, bilaterally Tympanometry results were consistent with normal middle ear function, bilaterally   Audiometric testing was completed using conventional audiometry with supraural transducer. Speech Recognition Thresholds were  45dB in the right ear and 40dB in the left ear. Word Recognition was performed  40dB SL, scored 84% in the right ear and 100% in the left ear. Pure tone thresholds shows mild sloping to moderate sensorineural hearing loss bilaterally.   Results:  The test results were reviewed with [name] and his wife. [name] has a moderate sensorineural hearing loss in both ears. He needs hearing aids. Hearing aid options discussed. He is ready to try aids. He was given handouts on OTC aids, aids through [place] healthcare benefit, and private purchase.    Recommendations: Amplification is necessary for both ears. Hearing aids can be purchased from a variety of locations. See provided list for locations in the [place] area. [employer] and two copies of results given to patient and wife.    32 minutes spent testing and counseling on results.   Ammie Ferrier  Audiologist, Au.D., CCC-A 06/12/2022  10:51 AM  Cc: [name], MD

## 2022-06-13 ENCOUNTER — Encounter: Payer: Self-pay | Admitting: Family Medicine

## 2022-06-13 ENCOUNTER — Ambulatory Visit (INDEPENDENT_AMBULATORY_CARE_PROVIDER_SITE_OTHER): Payer: 59 | Admitting: Family Medicine

## 2022-06-13 VITALS — BP 117/73 | HR 77 | Temp 98.2°F | Ht 71.0 in | Wt 279.0 lb

## 2022-06-13 DIAGNOSIS — I872 Venous insufficiency (chronic) (peripheral): Secondary | ICD-10-CM | POA: Diagnosis not present

## 2022-06-13 DIAGNOSIS — I25708 Atherosclerosis of coronary artery bypass graft(s), unspecified, with other forms of angina pectoris: Secondary | ICD-10-CM | POA: Diagnosis not present

## 2022-06-13 DIAGNOSIS — I10 Essential (primary) hypertension: Secondary | ICD-10-CM | POA: Diagnosis not present

## 2022-06-13 MED ORDER — DOXYCYCLINE HYCLATE 100 MG PO TABS
100.0000 mg | ORAL_TABLET | Freq: Two times a day (BID) | ORAL | 0 refills | Status: AC
Start: 2022-06-13 — End: ?

## 2022-06-13 MED ORDER — SILVER SULFADIAZINE 1 % EX CREA: TOPICAL_CREAM | CUTANEOUS | 0 refills | Status: DC

## 2022-06-13 MED ORDER — NITROGLYCERIN 0.4 MG SL SUBL
0.4000 mg | SUBLINGUAL_TABLET | SUBLINGUAL | 3 refills | Status: DC | PRN
Start: 2022-06-13 — End: 2023-01-02

## 2022-06-13 NOTE — Assessment & Plan Note (Addendum)
Multifactorial.  His lower extremity edema is improving slowly especially in right leg however his symptoms are still bothersome.  He has additionally developed cellulitis as above secondary to his stasis dermatitis in his left leg.  He is currently on torsemide 0.5 mg twice daily and spironolactone 12.5 mg daily has prescribed by his cardiologist.  It is reassuring that he is down about 6 pounds over the last few weeks.  No other signs of volume overload today. His diuretics do seem to be effective and he will continue with current dose for this.  We discussed other conservative measures including leg elevation and salt avoidance.  He may benefit from gentle compression however need to be careful with this due to his history of peripheral vascular disease as well.  He does have a home health nurse coming out and they can assist with compression stocking placement or compression bandages as needed.  He will let us know if he needs a new referral or any orders for them to do this.

## 2022-06-13 NOTE — Patient Instructions (Addendum)
It was very nice to see you today!  You have an infection in your leg.  Please start the doxycycline.  Please ask your home health company if they will do the compression wraps after your infection clears up.  Let us know if they need a referral order in order.  Return if symptoms worsen or fail to improve.   Take care, Dr [name]  PLEASE NOTE:  If you had any lab tests, please let us know if you have not heard back within a few days. You may see your results on mychart before we have a chance to review them but we will give you a call once they are reviewed by [place].   If we ordered any referrals today, please let us know if you have not heard from their office within the next week.   If you had any urgent prescriptions sent in today, please check with the pharmacy within an hour of our visit to make sure the prescription was transmitted appropriately.   Please try these tips to maintain a healthy lifestyle:  Eat at least 3 REAL meals and 1-2 snacks per day.  Aim for no more than 5 hours between eating.  If you eat breakfast, please do so within one hour of getting up.   Each meal should contain half fruits/vegetables, one quarter protein, and one quarter carbs (no bigger than a computer mouse)  Cut down on sweet beverages. This includes juice, soda, and sweet tea.   Drink at least 1 glass of water with each meal and aim for at least 8 glasses per day  Exercise at least 150 minutes every week.

## 2022-06-13 NOTE — Assessment & Plan Note (Signed)
Blood pressure at goal on spironolactone 12.5 mg daily and metoprolol tartrate 12.5 mg twice daily.

## 2022-06-13 NOTE — Progress Notes (Signed)
[name] [name] is a 67 y.o. male who presents today for an office visit.  Assessment/Plan:  New/Acute Problems: Cellulitis  Located on left lower extremity likely secondary to stasis dermatitis and skin breakdown due to his edema.  He has no signs of systemic illness.  He is anticoagulated on Eliquis-very low probability for DVT.  We will start course of doxycycline.  Will be treating his stasis dermatitis as below.  We discussed reasons to return to care and seek emergent care.  Chronic Problems Addressed Today: Stasis dermatitis Multifactorial.  His lower extremity edema is improving slowly especially in right leg however his symptoms are still bothersome.  He has additionally developed cellulitis as above secondary to his stasis dermatitis in his left leg.  He is currently on torsemide 0.5 mg twice daily and spironolactone 12.5 mg daily has prescribed by his cardiologist.  It is reassuring that he is down about 6 pounds over the last few weeks.  No other signs of volume overload today. His diuretics do seem to be effective and he will continue with current dose for this.  We discussed other conservative measures including leg elevation and salt avoidance.  He may benefit from gentle compression however need to be careful with this due to his history of peripheral vascular disease as well.  He does have a home health nurse coming out and they can assist with compression stocking placement or compression bandages as needed.  He will let us know if he needs a new referral or any orders for them to do this.  HTN (hypertension) Blood pressure at goal on spironolactone 12.5 mg daily and metoprolol tartrate 12.5 mg twice daily.     Subjective:  HPI:  See A/p for status of chronic conditions.  Main concern today is left leg pain.  He does have a longstanding history of bilateral lower extremity edema secondary to CHF and venous insufficiency and has been following with cardiology for this.   Currently on torsemide and spironolactone.  Swelling has been improving the last several days in his right leg however he feels like his left leg has not been progressing like it should. Over the last week or so he started developing more pain along the lateral aspect of his left lower leg.  More redness to left leg as well. No fevers or chills. No body aches.  No treatments tried.  He has been compliant with his diuretics.  They seem to be working well.  No shortness of breath.  No chest pain.  No reported orthopnea.        Objective:  Physical Exam: BP 117/73   Pulse 77   Temp 98.2 F (36.8 C) (Temporal)   Ht 5\' 11"  (1.803 m)   Wt 279 lb (126.6 kg)   SpO2 99%   BMI 38.91 kg/m   Wt Readings from Last 3 Encounters:  06/13/22 279 lb (126.6 kg)  05/30/22 285 lb 3.2 oz (129.4 kg)  05/23/22 293 lb (132.9 kg)    Gen: No acute distress, resting comfortably CV: Regular rate and rhythm with no murmurs appreciated Pulm: Normal work of breathing, clear to auscultation bilaterally with no crackles, wheezes, or rhonchi MSK: - Legs: Compression stockings in place.  3+ edema in bilateral lower extremities left worse than right.  Left leg with several abrasions and excoriations.  Large confluent area of erythema on left lower extremity.  Tender to palpation.  Warm to touch.  No purulent drainage. Neuro: Grossly normal, moves all extremities  Psych: Normal affect and thought content      [name] [name] M. [name], MD 06/13/2022 12:11 PM

## 2022-06-16 ENCOUNTER — Telehealth: Payer: Self-pay | Admitting: *Deleted

## 2022-06-16 ENCOUNTER — Encounter: Payer: Self-pay | Admitting: Family Medicine

## 2022-06-16 NOTE — Telephone Encounter (Signed)
[name] form faxed on 06/13/2022 fax#[phone]

## 2022-06-17 ENCOUNTER — Other Ambulatory Visit: Payer: Self-pay | Admitting: Family Medicine

## 2022-06-17 DIAGNOSIS — H25811 Combined forms of age-related cataract, right eye: Secondary | ICD-10-CM | POA: Diagnosis not present

## 2022-06-18 DIAGNOSIS — I50810 Right heart failure, unspecified: Secondary | ICD-10-CM | POA: Diagnosis not present

## 2022-06-18 DIAGNOSIS — I110 Hypertensive heart disease with heart failure: Secondary | ICD-10-CM | POA: Diagnosis not present

## 2022-06-18 DIAGNOSIS — Z7982 Long term (current) use of aspirin: Secondary | ICD-10-CM | POA: Diagnosis not present

## 2022-06-18 DIAGNOSIS — J302 Other seasonal allergic rhinitis: Secondary | ICD-10-CM | POA: Diagnosis not present

## 2022-06-18 DIAGNOSIS — J449 Chronic obstructive pulmonary disease, unspecified: Secondary | ICD-10-CM | POA: Diagnosis not present

## 2022-06-18 DIAGNOSIS — M1990 Unspecified osteoarthritis, unspecified site: Secondary | ICD-10-CM | POA: Diagnosis not present

## 2022-06-18 DIAGNOSIS — Z87891 Personal history of nicotine dependence: Secondary | ICD-10-CM | POA: Diagnosis not present

## 2022-06-18 DIAGNOSIS — Z556 Problems related to health literacy: Secondary | ICD-10-CM | POA: Diagnosis not present

## 2022-06-18 DIAGNOSIS — Z7901 Long term (current) use of anticoagulants: Secondary | ICD-10-CM | POA: Diagnosis not present

## 2022-06-18 DIAGNOSIS — E1142 Type 2 diabetes mellitus with diabetic polyneuropathy: Secondary | ICD-10-CM | POA: Diagnosis not present

## 2022-06-18 DIAGNOSIS — Z9181 History of falling: Secondary | ICD-10-CM | POA: Diagnosis not present

## 2022-06-18 DIAGNOSIS — I5032 Chronic diastolic (congestive) heart failure: Secondary | ICD-10-CM | POA: Diagnosis not present

## 2022-06-18 DIAGNOSIS — E785 Hyperlipidemia, unspecified: Secondary | ICD-10-CM | POA: Diagnosis not present

## 2022-06-18 DIAGNOSIS — K5900 Constipation, unspecified: Secondary | ICD-10-CM | POA: Diagnosis not present

## 2022-06-18 DIAGNOSIS — I071 Rheumatic tricuspid insufficiency: Secondary | ICD-10-CM | POA: Diagnosis not present

## 2022-06-18 DIAGNOSIS — E1151 Type 2 diabetes mellitus with diabetic peripheral angiopathy without gangrene: Secondary | ICD-10-CM | POA: Diagnosis not present

## 2022-06-18 DIAGNOSIS — Z794 Long term (current) use of insulin: Secondary | ICD-10-CM | POA: Diagnosis not present

## 2022-06-18 DIAGNOSIS — G4733 Obstructive sleep apnea (adult) (pediatric): Secondary | ICD-10-CM | POA: Diagnosis not present

## 2022-06-18 DIAGNOSIS — K769 Liver disease, unspecified: Secondary | ICD-10-CM | POA: Diagnosis not present

## 2022-06-18 DIAGNOSIS — K7460 Unspecified cirrhosis of liver: Secondary | ICD-10-CM | POA: Diagnosis not present

## 2022-06-18 DIAGNOSIS — I2510 Atherosclerotic heart disease of native coronary artery without angina pectoris: Secondary | ICD-10-CM | POA: Diagnosis not present

## 2022-06-23 ENCOUNTER — Ambulatory Visit: Payer: Self-pay

## 2022-06-23 NOTE — Patient Outreach (Signed)
  Care Coordination   Follow Up Visit Note   06/23/2022 Name: [name] [name] MRN: [id number] DOB: Jul 26, 1955  [name] [name] is a 67 y.o. year old male who sees [name], MD for primary care. I spoke with  [name] [name] by phone today.  What matters to the patients health and wellness today?  Managing health    Goals Addressed             This Visit's Progress    Heart Failure and Diabetes Management       Patient Goals/Self Care Activities: -Patient/Caregiver will self-administer medications as prescribed as evidenced by self-report/primary caregiver report  -Patient/Caregiver will attend all scheduled provider appointments as evidenced by clinician review of documented attendance to scheduled appointments and patient/caregiver report -Patient/Caregiver will call provider office for new concerns or questions as evidenced by review of documented incoming telephone call notes and patient report  -wear comfortable, well-fitting shoes -Weigh daily and record (notify MD with 3 lb weight gain over night or 5 lb in a week) -Follow CHF Action Plan -Adhere to low sodium diet   -Patient weight 279 lbs.  Discussed heart failure management. -[name] also assisting patient with medication management.  - Patient currently wearing compression hose and patient feels like it is helping.          SDOH assessments and interventions completed:  Yes     Care Coordination Interventions:  Yes, provided   Follow up plan: Follow up call scheduled for 07/09/22    Encounter Outcome:  Pt. Visit Completed   Bary Leriche, RN, MSN [facility] Care Management Care Management Coordinator Direct Line [phone]

## 2022-06-23 NOTE — Patient Instructions (Signed)
Visit Information  Thank you for taking time to visit with me today. Please don't hesitate to contact me if I can be of assistance to you.   Following are the goals we discussed today:   Goals Addressed             This Visit's Progress    Heart Failure and Diabetes Management       Patient Goals/Self Care Activities: -Patient/Caregiver will self-administer medications as prescribed as evidenced by self-report/primary caregiver report  -Patient/Caregiver will attend all scheduled provider appointments as evidenced by clinician review of documented attendance to scheduled appointments and patient/caregiver report -Patient/Caregiver will call provider office for new concerns or questions as evidenced by review of documented incoming telephone call notes and patient report  -wear comfortable, well-fitting shoes -Weigh daily and record (notify MD with 3 lb weight gain over night or 5 lb in a week) -Follow CHF Action Plan -Adhere to low sodium diet   -Patient weight 279 lbs.  Discussed heart failure management. -[name] also assisting patient with medication management.  - Patient currently wearing compression hose and patient feels like it is helping.          Our next appointment is by telephone on 07/09/22 at 1200 pm  Please call the care guide team at [phone] if you need to cancel or reschedule your appointment.   If you are experiencing a Mental Health or Behavioral Health Crisis or need someone to talk to, please call the Suicide and Crisis Lifeline: 988   Patient verbalizes understanding of instructions and care plan provided today and agrees to view in MyChart. Active MyChart status and patient understanding of how to access instructions and care plan via MyChart confirmed with patient.     The patient has been provided with contact information for the care management team and has been advised to call with any health related questions or concerns.    Bary Leriche, RN, MSN [facility] Care Management Care Management Coordinator Direct Line [phone]

## 2022-06-24 ENCOUNTER — Other Ambulatory Visit: Payer: Self-pay | Admitting: Family Medicine

## 2022-06-25 DIAGNOSIS — K769 Liver disease, unspecified: Secondary | ICD-10-CM | POA: Diagnosis not present

## 2022-06-25 DIAGNOSIS — Z7982 Long term (current) use of aspirin: Secondary | ICD-10-CM | POA: Diagnosis not present

## 2022-06-25 DIAGNOSIS — Z9181 History of falling: Secondary | ICD-10-CM | POA: Diagnosis not present

## 2022-06-25 DIAGNOSIS — I2510 Atherosclerotic heart disease of native coronary artery without angina pectoris: Secondary | ICD-10-CM | POA: Diagnosis not present

## 2022-06-25 DIAGNOSIS — I071 Rheumatic tricuspid insufficiency: Secondary | ICD-10-CM | POA: Diagnosis not present

## 2022-06-25 DIAGNOSIS — G4733 Obstructive sleep apnea (adult) (pediatric): Secondary | ICD-10-CM | POA: Diagnosis not present

## 2022-06-25 DIAGNOSIS — I5032 Chronic diastolic (congestive) heart failure: Secondary | ICD-10-CM | POA: Diagnosis not present

## 2022-06-25 DIAGNOSIS — J302 Other seasonal allergic rhinitis: Secondary | ICD-10-CM | POA: Diagnosis not present

## 2022-06-25 DIAGNOSIS — I110 Hypertensive heart disease with heart failure: Secondary | ICD-10-CM | POA: Diagnosis not present

## 2022-06-25 DIAGNOSIS — Z556 Problems related to health literacy: Secondary | ICD-10-CM | POA: Diagnosis not present

## 2022-06-25 DIAGNOSIS — E785 Hyperlipidemia, unspecified: Secondary | ICD-10-CM | POA: Diagnosis not present

## 2022-06-25 DIAGNOSIS — Z7901 Long term (current) use of anticoagulants: Secondary | ICD-10-CM | POA: Diagnosis not present

## 2022-06-25 DIAGNOSIS — M1990 Unspecified osteoarthritis, unspecified site: Secondary | ICD-10-CM | POA: Diagnosis not present

## 2022-06-25 DIAGNOSIS — I50810 Right heart failure, unspecified: Secondary | ICD-10-CM | POA: Diagnosis not present

## 2022-06-25 DIAGNOSIS — K7460 Unspecified cirrhosis of liver: Secondary | ICD-10-CM | POA: Diagnosis not present

## 2022-06-25 DIAGNOSIS — E1151 Type 2 diabetes mellitus with diabetic peripheral angiopathy without gangrene: Secondary | ICD-10-CM | POA: Diagnosis not present

## 2022-06-25 DIAGNOSIS — J449 Chronic obstructive pulmonary disease, unspecified: Secondary | ICD-10-CM | POA: Diagnosis not present

## 2022-06-25 DIAGNOSIS — E1142 Type 2 diabetes mellitus with diabetic polyneuropathy: Secondary | ICD-10-CM | POA: Diagnosis not present

## 2022-06-25 DIAGNOSIS — K5900 Constipation, unspecified: Secondary | ICD-10-CM | POA: Diagnosis not present

## 2022-06-25 DIAGNOSIS — Z87891 Personal history of nicotine dependence: Secondary | ICD-10-CM | POA: Diagnosis not present

## 2022-06-25 DIAGNOSIS — Z794 Long term (current) use of insulin: Secondary | ICD-10-CM | POA: Diagnosis not present

## 2022-06-27 DIAGNOSIS — H2512 Age-related nuclear cataract, left eye: Secondary | ICD-10-CM | POA: Diagnosis not present

## 2022-06-30 ENCOUNTER — Other Ambulatory Visit: Payer: Self-pay | Admitting: Family Medicine

## 2022-06-30 ENCOUNTER — Telehealth: Payer: Self-pay | Admitting: Family Medicine

## 2022-06-30 NOTE — Telephone Encounter (Signed)
Patient dropped off document Home Health Certificate (Order ID [id number]), to be filled out by provider. Patient requested to send it via Fax within 5-days. Document is located in providers tray at front office.

## 2022-07-01 DIAGNOSIS — H25812 Combined forms of age-related cataract, left eye: Secondary | ICD-10-CM | POA: Diagnosis not present

## 2022-07-01 NOTE — Progress Notes (Unsigned)
Cardiology Office Note:    Date:  07/02/2022  ID:  [name] [name], DOB 26-Mar-1955, MRN [id number] PCP: [name], MD  [facility] Providers Cardiologist:  Donato Schultz, MD          Patient Profile:   Coronary artery disease  Hx of MI s/p CABG in 2005 Myoview 10/15: low risk  (HFpEF) heart failure with preserved ejection fraction  Intol of SGLT2i 2/2 yeast infection  TTE 05/07/2017: EF 50-55, mild MR, mild RVE, moderate RAE  R sided HF Admx 04/2022  TTE 04/17/22: EF 55-60, no RWMA, D shaped LV c/w RV vol overload, severely reduced RVSF, NL PASP, RVSP 26.2, mild LAE, severe RAE, mild MR, torrential TR, AV sclerosis TEE 04/25/22: AV veg vs Lambl's excrescence, wide open TR, EF 55-60, no RWMA, mod reduced RVSF, severe RAE, mild MR, severe TR, trivial AI, Ao root 39 mm, Grade II plaque  Severe TR Hx of ectopic atrial rhythm - eval by EP (Dr. Elberta Fortis) in the past  Right Bundle Branch Block   Peripheral arterial disease (followed by [facility]) ABIs 08/06/2018: R 0.85; L 0.88  Hx of pulmonary embolism 04/2022  UE [place] + for acute superficial thrombosis R arm Chronic kidney disease  Diabetes mellitus  Hypertension  Hyperlipidemia  Morbid obesity  Venous insufficiency  Hx of pancreatitis  Hepatic Cirrhosis  Hx of ETOH abuse  OSA       History of Present Illness:   [name] [name] is a 67 y.o. male who returns for post hospital f/u. He was last seen by Dr. Anne Fu 04/08/22. He was admitted 3/6-3/16 with hypoxic respiratory failure in the setting of R sided heart failure and severe TR, Strep pyogenes bacteremia. He developed hypotension with IV Lasix. Hospitalization was also c/b AKI on chronic kidney disease. SCr peaked at 2.9. He had significant hypoMg2+ and hypoK+. VQ scan was c/w RUL pulmonary embolism. TEE was neg for vegetation. [place] was neg for DVT. He was started on Eliquis. He was also followed by ID. Source of infection was felt to be L sided mandibular/teeth infection.  He is here  today with his friend, Rene Kocher.  He just had left eye cataract surgery yesterday.  Since discharge from the hospital, he feels that he has been doing well.  His weight has come down significantly.  He was over 300 pounds when he went to the hospital.  He is down to 282 today.  He has not had significant shortness of breath.  He is trying to walk daily.  He has not had chest pain.  He has not had orthopnea, syncope.  His lower extremity edema is improved.  However, he still has significant lower extremity edema.  He notes that his PCP is trying to get him set up with compression stockings vs wraps.   Review of Systems  Gastrointestinal:  Negative for hematochezia and melena.  Genitourinary:  Negative for hematuria.       Studies Reviewed:    EKG:  not done  Risk Assessment/Calculations:             Physical Exam:   VS:  BP 112/78   Pulse 78   Ht 5\' 11"  (1.803 m)   Wt 282 lb 6.4 oz (128.1 kg)   SpO2 90%   BMI 39.39 kg/m    Wt Readings from Last 3 Encounters:  07/02/22 282 lb 6.4 oz (128.1 kg)  06/13/22 279 lb (126.6 kg)  05/30/22 285 lb 3.2 oz (129.4 kg)  Constitutional:      Appearance: Healthy appearance. Not in distress.  Neck:     Vascular: No JVR. JVD normal.  Pulmonary:     Breath sounds: Normal breath sounds. No wheezing. No rales.  Cardiovascular:     Normal rate. Regular rhythm.     Murmurs: There is no murmur.  Edema:    Peripheral edema (tight) present.    Pretibial: bilateral 1+ edema of the pretibial area.    Ankle: bilateral 1+ edema of the ankle. Abdominal:     Palpations: Abdomen is soft.       ASSESSMENT AND PLAN:   Chronic right-sided heart failure (HCC) NYHA II. Volume status stable. He still has persistent edema in his legs. We discussed management of this to include diuretics + compression. He had severe TR on his Echocardiogram and TEE. This was in the setting of acute PE. There was a question of whether the RV dysfunction and TR were related to  his PE. I recommend repeating an echocardiogram 3 mos after starting anticoagulation to see if this has improved any. If not, it may be worth having him see our structural heart team for evaluation.  Continue torsemide 10 mg daily, spironolactone 12.5 mg daily. Obtain BMET today. If renal function potassium stable, consider increasing torsemide to 20 mg daily. Arrange limited echo in 1 month to recheck RV function, tricuspid regurgitation Follow-up 3 months.  Severe tricuspid regurgitation As noted, question if this may be related to acute pulmonary embolism.  Follow-up limited echocardiogram will be obtained in 1 month to recheck RV function and tricuspid regurgitation.  Consider referral to structural heart clinic if tricuspid regurgitation remains severe.  Coronary artery disease of bypass graft of native heart with stable angina pectoris (HCC) History of MI status post CABG in 2005.  Myoview in 2015 was low risk.  He is not having symptoms to suggest angina.  Continue ASA 81 mg daily, metoprolol tartrate 12.5 mg twice daily, nitroglycerin as needed, Crestor 20 mg daily.  Ectopic atrial tachycardia (HCC) Continue metoprolol tartrate 12.5 mg twice daily.  HTN (hypertension) Blood pressure is well-controlled.  Continue metoprolol tartrate 12.5 mg twice daily, spironolactone 12.5 mg daily.  He was previously on losartan.  Blood pressure is currently soft and would not likely tolerate the addition of further antihypertensives.  HLD (hyperlipidemia) LDL optimal in February 2024 (53).  Continue Crestor 20 mg daily.  Pulmonary embolism (HCC) He remains on Eliquis.  Management per primary care.  Obstructive sleep apnea He has had difficulty getting equipment and has not been able to use his CPAP.  I have asked him to follow-up with his pulmonologist to see if he can provide assistance.     Dispo:  Return in about 3 months (around 10/02/2022) for Routine Follow Up w/ Dr.  Anne Fu.  Signed, Tereso Newcomer, PA-C

## 2022-07-01 NOTE — Telephone Encounter (Signed)
Form placed in PCP office to be reviewed  

## 2022-07-02 ENCOUNTER — Ambulatory Visit: Payer: 59 | Attending: Physician Assistant | Admitting: Physician Assistant

## 2022-07-02 ENCOUNTER — Encounter: Payer: Self-pay | Admitting: Physician Assistant

## 2022-07-02 VITALS — BP 112/78 | HR 78 | Ht 71.0 in | Wt 282.4 lb

## 2022-07-02 DIAGNOSIS — I071 Rheumatic tricuspid insufficiency: Secondary | ICD-10-CM

## 2022-07-02 DIAGNOSIS — I2609 Other pulmonary embolism with acute cor pulmonale: Secondary | ICD-10-CM | POA: Diagnosis not present

## 2022-07-02 DIAGNOSIS — I50812 Chronic right heart failure: Secondary | ICD-10-CM | POA: Diagnosis not present

## 2022-07-02 DIAGNOSIS — G4733 Obstructive sleep apnea (adult) (pediatric): Secondary | ICD-10-CM | POA: Diagnosis not present

## 2022-07-02 DIAGNOSIS — I4719 Other supraventricular tachycardia: Secondary | ICD-10-CM

## 2022-07-02 DIAGNOSIS — I25708 Atherosclerosis of coronary artery bypass graft(s), unspecified, with other forms of angina pectoris: Secondary | ICD-10-CM | POA: Diagnosis not present

## 2022-07-02 DIAGNOSIS — I10 Essential (primary) hypertension: Secondary | ICD-10-CM | POA: Diagnosis not present

## 2022-07-02 DIAGNOSIS — E782 Mixed hyperlipidemia: Secondary | ICD-10-CM | POA: Diagnosis not present

## 2022-07-02 NOTE — Assessment & Plan Note (Signed)
Continue metoprolol tartrate 12.5 mg twice daily.

## 2022-07-02 NOTE — Assessment & Plan Note (Signed)
Blood pressure is well-controlled.  Continue metoprolol tartrate 12.5 mg twice daily, spironolactone 12.5 mg daily.  He was previously on losartan.  Blood pressure is currently soft and would not likely tolerate the addition of further antihypertensives.

## 2022-07-02 NOTE — Patient Instructions (Signed)
Medication Instructions:  Your physician recommends that you continue on your current medications as directed. Please refer to the Current Medication list given to you today.  *If you need a refill on your cardiac medications before your next appointment, please call your pharmacy*   Lab Work: TODAY:  BMET  If you have labs (blood work) drawn today and your tests are completely normal, you will receive your results only by: MyChart Message (if you have MyChart) OR A paper copy in the mail If you have any lab test that is abnormal or we need to change your treatment, we will call you to review the results.   Testing/Procedures: Your physician has requested that you have an LIMITED ECHO IN 1 MONTH OR SO. Echocardiography is a painless test that uses sound waves to create images of your heart. It provides your doctor with information about the size and shape of your heart and how well your heart's chambers and valves are working. This procedure takes approximately one hour. There are no restrictions for this procedure. Please do NOT wear cologne, perfume, aftershave, or lotions (deodorant is allowed). Please arrive 15 minutes prior to your appointment time.    Follow-Up: At [facility], you and your health needs are our priority.  As part of our continuing mission to provide you with exceptional heart care, we have created designated Provider Care Teams.  These Care Teams include your primary Cardiologist (physician) and Advanced Practice Providers (APPs -  Physician Assistants and Nurse Practitioners) who all work together to provide you with the care you need, when you need it.  We recommend signing up for the patient portal called "MyChart".  Sign up information is provided on this After Visit Summary.  MyChart is used to connect with patients for Virtual Visits (Telemedicine).  Patients are able to view lab/test results, encounter notes, upcoming appointments, etc.  Non-urgent  messages can be sent to your provider as well.   To learn more about what you can do with MyChart, go to [email or web address].    Your next appointment:   3 month(s)  Provider:   Tereso Newcomer, PA-C         Other Instructions

## 2022-07-02 NOTE — Assessment & Plan Note (Signed)
He has had difficulty getting equipment and has not been able to use his CPAP.  I have asked him to follow-up with his pulmonologist to see if he can provide assistance.

## 2022-07-02 NOTE — Assessment & Plan Note (Signed)
LDL optimal in February 2024 (53).  Continue Crestor 20 mg daily.

## 2022-07-02 NOTE — Assessment & Plan Note (Signed)
NYHA II. Volume status stable. He still has persistent edema in his legs. We discussed management of this to include diuretics + compression. He had severe TR on his Echocardiogram and TEE. This was in the setting of acute PE. There was a question of whether the RV dysfunction and TR were related to his PE. I recommend repeating an echocardiogram 3 mos after starting anticoagulation to see if this has improved any. If not, it may be worth having him see our structural heart team for evaluation.  Continue torsemide 10 mg daily, spironolactone 12.5 mg daily. Obtain BMET today. If renal function potassium stable, consider increasing torsemide to 20 mg daily. Arrange limited echo in 1 month to recheck RV function, tricuspid regurgitation Follow-up 3 months.

## 2022-07-02 NOTE — Assessment & Plan Note (Signed)
He remains on Eliquis.  Management per primary care.

## 2022-07-02 NOTE — Assessment & Plan Note (Signed)
History of MI status post CABG in 2005.  Myoview in 2015 was low risk.  He is not having symptoms to suggest angina.  Continue ASA 81 mg daily, metoprolol tartrate 12.5 mg twice daily, nitroglycerin as needed, Crestor 20 mg daily.

## 2022-07-02 NOTE — Assessment & Plan Note (Signed)
As noted, question if this may be related to acute pulmonary embolism.  Follow-up limited echocardiogram will be obtained in 1 month to recheck RV function and tricuspid regurgitation.  Consider referral to structural heart clinic if tricuspid regurgitation remains severe.

## 2022-07-03 ENCOUNTER — Other Ambulatory Visit: Payer: Self-pay | Admitting: Physician Assistant

## 2022-07-03 ENCOUNTER — Telehealth: Payer: Self-pay

## 2022-07-03 ENCOUNTER — Other Ambulatory Visit: Payer: Self-pay | Admitting: Internal Medicine

## 2022-07-03 LAB — BASIC METABOLIC PANEL
BUN/Creatinine Ratio: 9 — ABNORMAL LOW (ref 10–24)
BUN: 12 mg/dL (ref 8–27)
CO2: 28 mmol/L (ref 20–29)
Calcium: 9.1 mg/dL (ref 8.6–10.2)
Chloride: 98 mmol/L (ref 96–106)
Creatinine, Ser: 1.32 mg/dL — ABNORMAL HIGH (ref 0.76–1.27)
Glucose: 135 mg/dL — ABNORMAL HIGH (ref 70–99)
Potassium: 3 mmol/L — ABNORMAL LOW (ref 3.5–5.2)
Sodium: 140 mmol/L (ref 134–144)
eGFR: 59 mL/min/{1.73_m2} — ABNORMAL LOW (ref >59)

## 2022-07-03 NOTE — Telephone Encounter (Signed)
-----   Message from Beatrice Lecher, [place] sent at 07/03/2022 11:11 AM EDT ----- Creatinine increased. K+ low.  His Creatinine is up since discharge from the hospital. But it is similar to historical values. I think his renal function is overall stable. He still had some volume to lose when I saw him and needs to get his K+ up. PLAN:  -Increase Spironolactone to 25 mg once daily  -Repeat BMET Tues 5/28. Tereso Newcomer, PA-C    07/03/2022 11:02 AM

## 2022-07-03 NOTE — Telephone Encounter (Signed)
Form faxed to [phone] Form placed to be scan in pt chart  

## 2022-07-03 NOTE — Telephone Encounter (Signed)
Left voicemail for patient to return call to office. 

## 2022-07-08 DIAGNOSIS — Z9181 History of falling: Secondary | ICD-10-CM | POA: Diagnosis not present

## 2022-07-08 DIAGNOSIS — E1142 Type 2 diabetes mellitus with diabetic polyneuropathy: Secondary | ICD-10-CM | POA: Diagnosis not present

## 2022-07-08 DIAGNOSIS — J302 Other seasonal allergic rhinitis: Secondary | ICD-10-CM | POA: Diagnosis not present

## 2022-07-08 DIAGNOSIS — I5032 Chronic diastolic (congestive) heart failure: Secondary | ICD-10-CM | POA: Diagnosis not present

## 2022-07-08 DIAGNOSIS — G4733 Obstructive sleep apnea (adult) (pediatric): Secondary | ICD-10-CM | POA: Diagnosis not present

## 2022-07-08 DIAGNOSIS — Z794 Long term (current) use of insulin: Secondary | ICD-10-CM | POA: Diagnosis not present

## 2022-07-08 DIAGNOSIS — E785 Hyperlipidemia, unspecified: Secondary | ICD-10-CM | POA: Diagnosis not present

## 2022-07-08 DIAGNOSIS — K769 Liver disease, unspecified: Secondary | ICD-10-CM | POA: Diagnosis not present

## 2022-07-08 DIAGNOSIS — Z556 Problems related to health literacy: Secondary | ICD-10-CM | POA: Diagnosis not present

## 2022-07-08 DIAGNOSIS — I071 Rheumatic tricuspid insufficiency: Secondary | ICD-10-CM | POA: Diagnosis not present

## 2022-07-08 DIAGNOSIS — Z87891 Personal history of nicotine dependence: Secondary | ICD-10-CM | POA: Diagnosis not present

## 2022-07-08 DIAGNOSIS — Z7982 Long term (current) use of aspirin: Secondary | ICD-10-CM | POA: Diagnosis not present

## 2022-07-08 DIAGNOSIS — I50810 Right heart failure, unspecified: Secondary | ICD-10-CM | POA: Diagnosis not present

## 2022-07-08 DIAGNOSIS — M1990 Unspecified osteoarthritis, unspecified site: Secondary | ICD-10-CM | POA: Diagnosis not present

## 2022-07-08 DIAGNOSIS — K7460 Unspecified cirrhosis of liver: Secondary | ICD-10-CM | POA: Diagnosis not present

## 2022-07-08 DIAGNOSIS — I110 Hypertensive heart disease with heart failure: Secondary | ICD-10-CM | POA: Diagnosis not present

## 2022-07-08 DIAGNOSIS — I2510 Atherosclerotic heart disease of native coronary artery without angina pectoris: Secondary | ICD-10-CM | POA: Diagnosis not present

## 2022-07-08 DIAGNOSIS — J449 Chronic obstructive pulmonary disease, unspecified: Secondary | ICD-10-CM | POA: Diagnosis not present

## 2022-07-08 DIAGNOSIS — Z7901 Long term (current) use of anticoagulants: Secondary | ICD-10-CM | POA: Diagnosis not present

## 2022-07-08 DIAGNOSIS — E1151 Type 2 diabetes mellitus with diabetic peripheral angiopathy without gangrene: Secondary | ICD-10-CM | POA: Diagnosis not present

## 2022-07-08 DIAGNOSIS — K5900 Constipation, unspecified: Secondary | ICD-10-CM | POA: Diagnosis not present

## 2022-07-09 ENCOUNTER — Other Ambulatory Visit: Payer: Self-pay

## 2022-07-09 ENCOUNTER — Ambulatory Visit: Payer: Self-pay

## 2022-07-09 DIAGNOSIS — Z79899 Other long term (current) drug therapy: Secondary | ICD-10-CM

## 2022-07-09 NOTE — Patient Outreach (Signed)
  Care Coordination   07/09/2022 Name: [name] [name] MRN: [id number] DOB: November 22, 1955   Care Coordination Outreach Attempts:  An unsuccessful telephone outreach was attempted today to offer the patient information about available care coordination services.  Follow Up Plan:  Additional outreach attempts will be made to offer the patient care coordination information and services.   Encounter Outcome:  No Answer    Care Coordination Interventions:  No, not indicated    Bary Leriche, RN, MSN [facility] Care Management Care Management Coordinator Direct Line [phone]

## 2022-07-10 ENCOUNTER — Telehealth: Payer: Self-pay

## 2022-07-10 NOTE — Patient Instructions (Signed)
Visit Information  Thank you for taking time to visit with me today. Please don't hesitate to contact me if I can be of assistance to you.   Following are the goals we discussed today:   Goals Addressed             This Visit's Progress    Heart Failure and Diabetes Management       Patient Goals/Self Care Activities: -Patient/Caregiver will self-administer medications as prescribed as evidenced by self-report/primary caregiver report  -Patient/Caregiver will attend all scheduled provider appointments as evidenced by clinician review of documented attendance to scheduled appointments and patient/caregiver report -Patient/Caregiver will call provider office for new concerns or questions as evidenced by review of documented incoming telephone call notes and patient report  -wear comfortable, well-fitting shoes -Weigh daily and record (notify MD with 3 lb weight gain over night or 5 lb in a week) -Follow CHF Action Plan -Adhere to low sodium diet   -Patient weight 280 lbs.  Reiterated heart failure management. -[name]-complete - Patient currently wearing compression hose and patient feels like it is helping.          Our next appointment is by telephone on 07/25/22 at 1030  Please call the care guide team at [phone] if you need to cancel or reschedule your appointment.   If you are experiencing a Mental Health or Behavioral Health Crisis or need someone to talk to, please call the Suicide and Crisis Lifeline: 988   Patient verbalizes understanding of instructions and care plan provided today and agrees to view in MyChart. Active MyChart status and patient understanding of how to access instructions and care plan via MyChart confirmed with patient.     The patient has been provided with contact information for the care management team and has been advised to call with any health related questions or concerns.   Bary Leriche, RN, MSN [facility] Care  Management Care Management Coordinator Direct Line [phone]

## 2022-07-10 NOTE — Patient Outreach (Signed)
  Care Coordination   Follow Up Visit Note   07/10/2022 Name: [name] [name] MRN: [id number] DOB: 1955-12-15  CAOLAN [name] is a 67 y.o. year old male who sees [name], MD for primary care. I spoke with  [name] [name] by phone today.  What matters to the patients health and wellness today?  Maintain health    Goals Addressed             This Visit's Progress    Heart Failure and Diabetes Management       Patient Goals/Self Care Activities: -Patient/Caregiver will self-administer medications as prescribed as evidenced by self-report/primary caregiver report  -Patient/Caregiver will attend all scheduled provider appointments as evidenced by clinician review of documented attendance to scheduled appointments and patient/caregiver report -Patient/Caregiver will call provider office for new concerns or questions as evidenced by review of documented incoming telephone call notes and patient report  -wear comfortable, well-fitting shoes -Weigh daily and record (notify MD with 3 lb weight gain over night or 5 lb in a week) -Follow CHF Action Plan -Adhere to low sodium diet   -Patient weight 280 lbs.  Reiterated heart failure management. -[name]-complete - Patient currently wearing compression hose and patient feels like it is helping.          SDOH assessments and interventions completed:  Yes     Care Coordination Interventions:  Yes, provided   Follow up plan: Follow up call scheduled for June    Encounter Outcome:  Pt. Visit Completed   Bary Leriche, RN, MSN [facility] Care Management Care Management Coordinator Direct Line [phone]

## 2022-07-16 ENCOUNTER — Ambulatory Visit: Payer: 59 | Attending: Physician Assistant

## 2022-07-16 ENCOUNTER — Telehealth: Payer: Self-pay | Admitting: Cardiology

## 2022-07-16 ENCOUNTER — Other Ambulatory Visit: Payer: Self-pay | Admitting: Physician Assistant

## 2022-07-16 DIAGNOSIS — Z79899 Other long term (current) drug therapy: Secondary | ICD-10-CM | POA: Diagnosis not present

## 2022-07-16 DIAGNOSIS — E876 Hypokalemia: Secondary | ICD-10-CM

## 2022-07-16 LAB — BASIC METABOLIC PANEL
BUN/Creatinine Ratio: 9 — ABNORMAL LOW (ref 10–24)
BUN: 11 mg/dL (ref 8–27)
CO2: 26 mmol/L (ref 20–29)
Calcium: 8.9 mg/dL (ref 8.6–10.2)
Chloride: 100 mmol/L (ref 96–106)
Creatinine, Ser: 1.2 mg/dL (ref 0.76–1.27)
Glucose: 115 mg/dL — ABNORMAL HIGH (ref 70–99)
Potassium: 2.7 mmol/L — CL (ref 3.5–5.2)
Sodium: 141 mmol/L (ref 134–144)
eGFR: 66 mL/min/{1.73_m2} (ref >59)

## 2022-07-16 MED ORDER — POTASSIUM CHLORIDE CRYS ER 20 MEQ PO TBCR: 40.0000 meq | EXTENDED_RELEASE_TABLET | Freq: Two times a day (BID) | ORAL | 11 refills | Status: DC

## 2022-07-16 NOTE — Telephone Encounter (Signed)
Received call from [employer] regarding critical K+ of 2.7 from today. I reached out to the patient but he had received a call from Tereso Newcomer, [place] regarding plans for supplementation and follow up labs tomorrow. He voiced understanding of plan and thanked me for callback.

## 2022-07-17 ENCOUNTER — Other Ambulatory Visit: Payer: Self-pay | Admitting: Family Medicine

## 2022-07-17 ENCOUNTER — Ambulatory Visit: Payer: 59 | Attending: Cardiology

## 2022-07-17 DIAGNOSIS — E876 Hypokalemia: Secondary | ICD-10-CM

## 2022-07-17 LAB — BASIC METABOLIC PANEL
BUN/Creatinine Ratio: 9 — ABNORMAL LOW (ref 10–24)
BUN: 10 mg/dL (ref 8–27)
CO2: 27 mmol/L (ref 20–29)
Calcium: 9 mg/dL (ref 8.6–10.2)
Chloride: 102 mmol/L (ref 96–106)
Creatinine, Ser: 1.09 mg/dL (ref 0.76–1.27)
Glucose: 172 mg/dL — ABNORMAL HIGH (ref 70–99)
Potassium: 3.1 mmol/L — ABNORMAL LOW (ref 3.5–5.2)
Sodium: 140 mmol/L (ref 134–144)
eGFR: 74 mL/min/{1.73_m2} (ref >59)

## 2022-07-18 ENCOUNTER — Telehealth: Payer: Self-pay | Admitting: Pharmacist

## 2022-07-18 ENCOUNTER — Other Ambulatory Visit: Payer: Self-pay

## 2022-07-18 ENCOUNTER — Telehealth: Payer: Self-pay | Admitting: *Deleted

## 2022-07-18 DIAGNOSIS — Z79899 Other long term (current) drug therapy: Secondary | ICD-10-CM

## 2022-07-18 MED ORDER — SPIRONOLACTONE 25 MG PO TABS: 25.0000 mg | ORAL_TABLET | Freq: Every day | ORAL | 11 refills | Status: DC

## 2022-07-18 NOTE — Progress Notes (Signed)
Care Management & Coordination Services Pharmacy Team  Reason for Encounter: Medication coordination and delivery  Contacted patient to discuss medications and coordinate delivery from [facility]. Spoke with patient on 07/18/2022  Cycle dispensing form sent to Erskine Emery for review.    Patient is due for next adherence delivery on: 07/28/2022  This delivery to include: Adherence Packaging  30 Days  Eliquis 5 mg 1 at breakfast, 1 at evening meal Metoprolol Tartrate 25 mg 1/2 tablet at breakfast, 1/2 tablet at evening meal Rosuvastatin 20 mg 1 at evening meal Torsemide 20 mg 1/2 tablet at breakfast, 1/2 tablet at evening meal Famotidine 20 mg 1 at evening meal Trellegy Ellipta Aspirin 81 mg 1 at breakfast Magnesium 400 mg 1 at breakfast   Patient declined the following medications this month: Spironolactone - patient states his cardiologist has increased the dose to 1 tablet. He states he is currently out of this medication. Patient is aware this is not noted during his last office visit. Patient states he is going to call his cardiologist office and request a new prescription to be sent to a local pharmacy that he can pick up this evening.  No refill request needed.  Confirmed delivery date of 07/28/2022, advised patient that pharmacy will contact them the morning of delivery.   Any concerns about your medications? No  How often do you forget or accidentally miss a dose? Rarely  Do you use a pillbox? No  Is patient in packaging Yes  If yes  What is the date on your next pill pack?  Any concerns or issues with your packaging?   Chart review: Recent office visits:  06/13/2022 [facility] (PCP) [name], MD; Blood pressure at goal on spironolactone 25 mg daily and Metroprolol tartrate 12.5 mg twice daily   Recent consult visits:  07/02/2022 [facility] (Cardiology) Tereso Newcomer T, PA-C; no medication changes indicated.  Hospital visits:  None in previous 6  months  Medications: Outpatient Encounter Medications as of 07/18/2022  Medication Sig   aspirin EC 81 MG tablet Take 81 mg by mouth daily.   B-D ULTRAFINE III SHORT PEN 31G X 8 MM MISC    diclofenac Sodium (VOLTAREN) 1 % GEL APPLY 2 GRAMS TOPICALLY 4 TIMES DAILY TO KNEE   doxycycline (VIBRA-TABS) 100 MG tablet Take 1 tablet (100 mg total) by mouth 2 (two) times daily.   ELIQUIS 5 MG TABS tablet TAKE ONE TABLET BY MOUTH TWICE DAILY   famotidine (PEPCID) 20 MG tablet Take 1 tablet (20 mg total) by mouth daily as needed for heartburn.   fluticasone (FLONASE) 50 MCG/ACT nasal spray Place 1 spray into both nostrils daily.   Fluticasone-Umeclidin-Vilant (TRELEGY ELLIPTA) 100-62.5-25 MCG/ACT AEPB INHALE 1 PUFF INTO THE LUNGS DAILY   glucose blood (ONETOUCH ULTRA) test strip TEST 8 TIMES DAILY   HUMALOG 100 UNIT/ML injection Inject into the skin See admin instructions. Per SSI   hydroxypropyl methylcellulose / hypromellose (ISOPTO TEARS / GONIOVISC) 2.5 % ophthalmic solution Place 2 drops into both eyes as needed for dry eyes.   LANTUS SOLOSTAR 100 UNIT/ML Solostar Pen Inject 20 Units into the skin every morning.   lidocaine (LIDODERM) 5 % Place 1 patch onto the skin daily. Remove & Discard patch within 12 hours or as directed by MD   LINZESS 290 MCG CAPS capsule TAKE 1 CAPSULE(290 MCG) BY MOUTH DAILY BEFORE BREAKFAST   MAGNESIUM-OXIDE 400 (241.3 Mg) MG tablet Take 1 tablet by mouth daily.   metoprolol tartrate (LOPRESSOR) 25  MG tablet TAKE 1/2 TABLET(12.5 MG) BY MOUTH TWICE DAILY   nitroGLYCERIN (NITROSTAT) 0.4 MG SL tablet Place 1 tablet (0.4 mg total) under the tongue every 5 (five) minutes as needed for chest pain.   polyethylene glycol (MIRALAX / GLYCOLAX) 17 g packet Take 17 g by mouth daily.   potassium chloride SA (KLOR-CON M) 20 MEQ tablet Take 2 tablets (40 mEq total) by mouth 2 (two) times daily.   PROAIR HFA 108 (90 Base) MCG/ACT inhaler Inhale 2 puffs into the lungs every 6 (six) hours as  needed for wheezing or shortness of breath.   rosuvastatin (CRESTOR) 20 MG tablet Take 1 tablet (20 mg total) by mouth every evening.   silver sulfADIAZINE (SSD) 1 % cream APPLY PEA SIZED AMOUNT TOPICALLY TO WOUND DAILY   spironolactone (ALDACTONE) 25 MG tablet TAKE 1/2 TABLET(12.5 MG) BY MOUTH DAILY   torsemide (DEMADEX) 20 MG tablet Take 0.5 tablets (10 mg total) by mouth daily. Pt taking 1/2 tab daily   No facility-administered encounter medications on file as of 07/18/2022.   BP Readings from Last 3 Encounters:  07/02/22 112/78  06/13/22 117/73  05/30/22 138/77    Pulse Readings from Last 3 Encounters:  07/02/22 78  06/13/22 77  05/30/22 94    Lab Results  Component Value Date/Time   HGBA1C 8.2 05/25/2022 12:00 AM   HGBA1C 9.7 (H) 04/17/2022 08:30 AM   HGBA1C 11.0 (H) 05/09/2021 09:57 AM   HGBA1C 10.6 02/02/2020 12:00 AM   Lab Results  Component Value Date   CREATININE 1.09 07/17/2022   BUN 10 07/17/2022   GFR 73.03 05/14/2022   GFRNONAA >60 04/26/2022   GFRAA 65 12/12/2019   NA 140 07/17/2022   K 3.1 (L) 07/17/2022   CALCIUM 9.0 07/17/2022   CO2 27 07/17/2022     Future Appointments  Date Time Provider Department Center  07/21/2022  4:00 PM [facility]  07/25/2022 10:30 AM Fleeta Emmer, RN [facility] None  08/04/2022  9:35 AM [facility] ECHO 5 [facility]  09/01/2022  3:30 PM Erroll Luna, RPH [facility] None  10/01/2022 10:05 AM Tereso Newcomer T, PA-C [facility]  12/04/2022 10:00 AM [facility] ANNUAL WELLNESS VISIT 1 [facility] PEC  01/02/2023  9:00 AM Jake Bathe, MD [facility]   April D Calhoun, [facility] Clinical Pharmacist Assistant [phone]

## 2022-07-18 NOTE — Telephone Encounter (Signed)
-----   Message from Beatrice Lecher, [place] sent at 07/17/2022  5:32 PM EDT ----- Results sent to [name] via MyChart. See MyChart comments below. PLAN:  -Repeat BMET Monday.  Mr. Ludlum  Your potassium is improved. But it is still low. Continue on the dose of potassium I started you on (2 tablets twice daily). I will recheck your lab again on Monday. Tereso Newcomer, PA-C

## 2022-07-19 ENCOUNTER — Other Ambulatory Visit: Payer: Self-pay | Admitting: Family Medicine

## 2022-07-21 ENCOUNTER — Telehealth: Payer: Self-pay

## 2022-07-21 ENCOUNTER — Other Ambulatory Visit: Payer: Self-pay | Admitting: Physician Assistant

## 2022-07-21 ENCOUNTER — Ambulatory Visit: Payer: 59

## 2022-07-21 NOTE — Patient Outreach (Signed)
  Care Coordination   Follow Up Visit Note   07/21/2022 Name: [name] [name] MRN: [id number] DOB: [birth year]  [name] [name] is a 67 y.o. year old male who sees [name], MD for primary care. I spoke with  [name] by phone today.  What matters to the patients health and wellness today?  Maintaining health    Goals Addressed             This Visit's Progress    Heart Failure and Diabetes Management       Patient Goals/Self Care Activities: -Patient/Caregiver will self-administer medications as prescribed as evidenced by self-report/primary caregiver report  -Patient/Caregiver will attend all scheduled provider appointments as evidenced by clinician review of documented attendance to scheduled appointments and patient/caregiver report -Patient/Caregiver will call provider office for new concerns or questions as evidenced by review of documented incoming telephone call notes and patient report  -wear comfortable, well-fitting shoes -Weigh daily and record (notify MD with 3 lb weight gain over night or 5 lb in a week) -Follow CHF Action Plan -Adhere to low sodium diet   -Patient weight 281 lbs.  Reiterated heart failure management. - Patient currently wearing compression hose and patient feels like it is helping. -Blood sugar 134-165.  Discussed diabetes management.           SDOH assessments and interventions completed:  Yes     Care Coordination Interventions:  Yes, provided   Follow up plan: Follow up call scheduled for July    Encounter Outcome:  Pt. Visit Completed   Bary Leriche, RN, MSN [facility] Care Management Care Management Coordinator Direct Line [phone]

## 2022-07-21 NOTE — Patient Instructions (Signed)
Visit Information  Thank you for taking time to visit with me today. Please don't hesitate to contact me if I can be of assistance to you.   Following are the goals we discussed today:   Goals Addressed             This Visit's Progress    Heart Failure and Diabetes Management       Patient Goals/Self Care Activities: -Patient/Caregiver will self-administer medications as prescribed as evidenced by self-report/primary caregiver report  -Patient/Caregiver will attend all scheduled provider appointments as evidenced by clinician review of documented attendance to scheduled appointments and patient/caregiver report -Patient/Caregiver will call provider office for new concerns or questions as evidenced by review of documented incoming telephone call notes and patient report  -wear comfortable, well-fitting shoes -Weigh daily and record (notify MD with 3 lb weight gain over night or 5 lb in a week) -Follow CHF Action Plan -Adhere to low sodium diet   -Patient weight 281 lbs.  Reiterated heart failure management. - Patient currently wearing compression hose and patient feels like it is helping. -Blood sugar 134-165.  Discussed diabetes management.  Patient to work on [employer] for transportation          Our next appointment is by telephone on 08/18/22 at 1030 am  Please call the care guide team at [phone] if you need to cancel or reschedule your appointment.   If you are experiencing a Mental Health or Behavioral Health Crisis or need someone to talk to, please call the Suicide and Crisis Lifeline: 988   Patient verbalizes understanding of instructions and care plan provided today and agrees to view in MyChart. Active MyChart status and patient understanding of how to access instructions and care plan via MyChart confirmed with patient.     The patient has been provided with contact information for the care management team and has been advised to call with  any health related questions or concerns.   Bary Leriche, RN, MSN [facility] Care Management Care Management Coordinator Direct Line [phone]

## 2022-07-22 ENCOUNTER — Ambulatory Visit: Payer: 59

## 2022-07-22 MED ORDER — ONETOUCH ULTRA VI STRP: ORAL_STRIP | 0 refills | Status: DC

## 2022-07-24 ENCOUNTER — Ambulatory Visit: Payer: 59 | Attending: Physician Assistant

## 2022-07-24 DIAGNOSIS — Z79899 Other long term (current) drug therapy: Secondary | ICD-10-CM | POA: Diagnosis not present

## 2022-07-25 ENCOUNTER — Telehealth: Payer: Self-pay | Admitting: *Deleted

## 2022-07-25 DIAGNOSIS — Z79899 Other long term (current) drug therapy: Secondary | ICD-10-CM

## 2022-07-25 LAB — BASIC METABOLIC PANEL
BUN/Creatinine Ratio: 9 — ABNORMAL LOW (ref 10–24)
BUN: 10 mg/dL (ref 8–27)
CO2: 25 mmol/L (ref 20–29)
Calcium: 10.2 mg/dL (ref 8.6–10.2)
Chloride: 101 mmol/L (ref 96–106)
Creatinine, Ser: 1.11 mg/dL (ref 0.76–1.27)
Glucose: 150 mg/dL — ABNORMAL HIGH (ref 70–99)
Potassium: 4.2 mmol/L (ref 3.5–5.2)
Sodium: 139 mmol/L (ref 134–144)
eGFR: 72 mL/min/{1.73_m2} (ref >59)

## 2022-07-25 NOTE — Telephone Encounter (Signed)
-----   Message from Beatrice Lecher, [place] sent at 07/25/2022  9:56 AM EDT ----- Results sent to [name] via MyChart. See MyChart comments below. PLAN:  -BMET 2 weeks.  Mr. Chernow  Your kidney function (creatinine) and potassium are normal. Continue current medications/treatment plan and follow up as scheduled. I will recheck your labs in a couple of weeks to make sure your potassium is stable. Tereso Newcomer, PA-C

## 2022-07-28 ENCOUNTER — Other Ambulatory Visit: Payer: Self-pay | Admitting: Family Medicine

## 2022-08-04 ENCOUNTER — Ambulatory Visit: Payer: 59

## 2022-08-04 ENCOUNTER — Ambulatory Visit (HOSPITAL_COMMUNITY): Payer: 59

## 2022-08-06 ENCOUNTER — Other Ambulatory Visit: Payer: Self-pay | Admitting: Cardiology

## 2022-08-07 ENCOUNTER — Ambulatory Visit: Payer: 59 | Attending: Physician Assistant

## 2022-08-07 DIAGNOSIS — Z79899 Other long term (current) drug therapy: Secondary | ICD-10-CM | POA: Diagnosis not present

## 2022-08-08 ENCOUNTER — Telehealth: Payer: Self-pay | Admitting: Family Medicine

## 2022-08-08 LAB — BASIC METABOLIC PANEL
BUN/Creatinine Ratio: 15 (ref 10–24)
BUN: 17 mg/dL (ref 8–27)
CO2: 21 mmol/L (ref 20–29)
Calcium: 9.7 mg/dL (ref 8.6–10.2)
Chloride: 101 mmol/L (ref 96–106)
Creatinine, Ser: 1.13 mg/dL (ref 0.76–1.27)
Glucose: 158 mg/dL — ABNORMAL HIGH (ref 70–99)
Potassium: 4 mmol/L (ref 3.5–5.2)
Sodium: 138 mmol/L (ref 134–144)
eGFR: 71 mL/min/{1.73_m2} (ref >59)

## 2022-08-08 NOTE — Telephone Encounter (Signed)
Patient states he stepped on a nail like tack today. States he called EMT and they were able to evaluate him and vitals were WNL. Patient is requesting to know when last tdap vaccine. Please Advise.

## 2022-08-08 NOTE — Telephone Encounter (Signed)
Spoke to pt told him his last Tetanus shot was 09/01/2019. Pt verbalized understanding and said the bleeding has stopped. Told him okay, any problems please schedule an appointment with Dr. [name]. Pt verbalized understanding.

## 2022-08-18 ENCOUNTER — Ambulatory Visit: Payer: Self-pay

## 2022-08-18 NOTE — Patient Instructions (Signed)
Visit Information  Thank you for taking time to visit with me today. Please don't hesitate to contact me if I can be of assistance to you.   Following are the goals we discussed today:   Goals Addressed             This Visit's Progress    Heart Failure and Diabetes Management       Patient Goals/Self Care Activities: -Patient/Caregiver will self-administer medications as prescribed as evidenced by self-report/primary caregiver report  -Patient/Caregiver will attend all scheduled provider appointments as evidenced by clinician review of documented attendance to scheduled appointments and patient/caregiver report -Patient/Caregiver will call provider office for new concerns or questions as evidenced by review of documented incoming telephone call notes and patient report  -wear comfortable, well-fitting shoes -Weigh daily and record (notify MD with 3 lb weight gain over night or 5 lb in a week) -Follow CHF Action Plan -Adhere to low sodium diet   -Patient weight 275 lbs.  Reiterated heart failure management. - Patient currently wearing compression hose and patient feels like it is helping. -Blood sugar 85-165.  Discussed diabetes management.            Our next appointment is by telephone on 09-17-22 at 1030  Please call the care guide team at [phone] if you need to cancel or reschedule your appointment.   If you are experiencing a Mental Health or Behavioral Health Crisis or need someone to talk to, please call the Suicide and Crisis Lifeline: 988   Patient verbalizes understanding of instructions and care plan provided today and agrees to view in MyChart. Active MyChart status and patient understanding of how to access instructions and care plan via MyChart confirmed with patient.     The patient has been provided with contact information for the care management team and has been advised to call with any health related questions or concerns.   Bary Leriche, RN,  MSN [facility] Care Management Care Management Coordinator Direct Line [phone]

## 2022-08-18 NOTE — Patient Outreach (Signed)
  Care Coordination   Follow Up Visit Note   08/18/2022 Name: [name] [name] MRN: [id number] DOB: [birth year]  [name] [name] is a 67 y.o. year old male who sees [name], MD for primary care. I spoke with  [name] [name] by phone today.  What matters to the patients health and wellness today?  Heart failure management    Goals Addressed             This Visit's Progress    Heart Failure and Diabetes Management       Patient Goals/Self Care Activities: -Patient/Caregiver will self-administer medications as prescribed as evidenced by self-report/primary caregiver report  -Patient/Caregiver will attend all scheduled provider appointments as evidenced by clinician review of documented attendance to scheduled appointments and patient/caregiver report -Patient/Caregiver will call provider office for new concerns or questions as evidenced by review of documented incoming telephone call notes and patient report  -wear comfortable, well-fitting shoes -Weigh daily and record (notify MD with 3 lb weight gain over night or 5 lb in a week) -Follow CHF Action Plan -Adhere to low sodium diet   -Patient weight 275 lbs.  Reiterated heart failure management. - Patient currently wearing compression hose and patient feels like it is helping. -Blood sugar 85-165.  Discussed diabetes management.            SDOH assessments and interventions completed:  Yes     Care Coordination Interventions:  Yes, provided   Follow up plan: Follow up call scheduled for August    Encounter Outcome:  Pt. Visit Completed

## 2022-08-26 ENCOUNTER — Encounter: Payer: Self-pay | Admitting: Pharmacist

## 2022-08-26 NOTE — Progress Notes (Signed)
Patient previously followed by [facility] pharmacist. Per clinical review, no pharmacist appointment needed at this time. Patient followed by endocrinology and RNCM. Care guide directed to contact patient and cancel appointment and notify pharmacy team of any patient concerns.

## 2022-09-01 ENCOUNTER — Encounter: Payer: 59 | Admitting: Pharmacist

## 2022-09-02 ENCOUNTER — Other Ambulatory Visit: Payer: 59

## 2022-09-02 ENCOUNTER — Ambulatory Visit (HOSPITAL_COMMUNITY): Payer: 59

## 2022-09-06 ENCOUNTER — Other Ambulatory Visit: Payer: Self-pay | Admitting: Family Medicine

## 2022-09-08 ENCOUNTER — Other Ambulatory Visit: Payer: Self-pay | Admitting: Internal Medicine

## 2022-09-09 ENCOUNTER — Other Ambulatory Visit: Payer: Self-pay | Admitting: Family Medicine

## 2022-09-10 DIAGNOSIS — J45998 Other asthma: Secondary | ICD-10-CM | POA: Diagnosis not present

## 2022-09-14 ENCOUNTER — Other Ambulatory Visit: Payer: Self-pay | Admitting: Internal Medicine

## 2022-09-15 ENCOUNTER — Other Ambulatory Visit: Payer: Self-pay | Admitting: Internal Medicine

## 2022-09-16 DIAGNOSIS — E1151 Type 2 diabetes mellitus with diabetic peripheral angiopathy without gangrene: Secondary | ICD-10-CM | POA: Diagnosis not present

## 2022-09-16 DIAGNOSIS — L84 Corns and callosities: Secondary | ICD-10-CM | POA: Diagnosis not present

## 2022-09-16 DIAGNOSIS — R6889 Other general symptoms and signs: Secondary | ICD-10-CM | POA: Diagnosis not present

## 2022-09-16 DIAGNOSIS — E1142 Type 2 diabetes mellitus with diabetic polyneuropathy: Secondary | ICD-10-CM | POA: Diagnosis not present

## 2022-09-16 DIAGNOSIS — L603 Nail dystrophy: Secondary | ICD-10-CM | POA: Diagnosis not present

## 2022-09-16 DIAGNOSIS — I739 Peripheral vascular disease, unspecified: Secondary | ICD-10-CM | POA: Diagnosis not present

## 2022-09-16 MED ORDER — TRELEGY ELLIPTA 100-62.5-25 MCG/ACT IN AEPB: INHALATION_SPRAY | RESPIRATORY_TRACT | 0 refills | Status: DC

## 2022-09-17 ENCOUNTER — Ambulatory Visit: Payer: Self-pay

## 2022-09-17 NOTE — Patient Instructions (Signed)
Visit Information  Thank you for taking time to visit with me today. Please don't hesitate to contact me if I can be of assistance to you.   Following are the goals we discussed today:   Goals Addressed             This Visit's Progress    Heart Failure and Diabetes Management       Patient Goals/Self Care Activities: -Patient/Caregiver will self-administer medications as prescribed as evidenced by self-report/primary caregiver report  -Patient/Caregiver will attend all scheduled provider appointments as evidenced by clinician review of documented attendance to scheduled appointments and patient/caregiver report -Patient/Caregiver will call provider office for new concerns or questions as evidenced by review of documented incoming telephone call notes and patient report  -wear comfortable, well-fitting shoes -Weigh daily and record (notify MD with 3 lb weight gain over night or 5 lb in a week) -Follow CHF Action Plan -Adhere to low sodium diet   -Patient weight 274 lbs.   Reinforced heart failure management. - Patient currently wearing compression hose and patient states swelling is better -Blood sugar highest 154 per patient.  Reinforced diabetes management.            Our next appointment is by telephone on 10/30/22 at 1030 am  Please call the care guide team at [phone] if you need to cancel or reschedule your appointment.   If you are experiencing a Mental Health or Behavioral Health Crisis or need someone to talk to, please call the Suicide and Crisis Lifeline: 988   Patient verbalizes understanding of instructions and care plan provided today and agrees to view in MyChart. Active MyChart status and patient understanding of how to access instructions and care plan via MyChart confirmed with patient.     The patient has been provided with contact information for the care management team and has been advised to call with any health related questions or concerns.    Bary Leriche, RN, MSN [facility] Care Management Care Management Coordinator Direct Line [phone]

## 2022-09-17 NOTE — Patient Outreach (Signed)
  Care Coordination   Follow Up Visit Note   09/17/2022 Name: [name] [name] MRN: [id number] DOB: 09/24/1955  [name] [name] is a 67 y.o. year old male who sees [name], MD for primary care. I spoke with  [name] by phone today.  What matters to the patients health and wellness today?  Health Management    Goals Addressed             This Visit's Progress    Heart Failure and Diabetes Management       Patient Goals/Self Care Activities: -Patient/Caregiver will self-administer medications as prescribed as evidenced by self-report/primary caregiver report  -Patient/Caregiver will attend all scheduled provider appointments as evidenced by clinician review of documented attendance to scheduled appointments and patient/caregiver report -Patient/Caregiver will call provider office for new concerns or questions as evidenced by review of documented incoming telephone call notes and patient report  -wear comfortable, well-fitting shoes -Weigh daily and record (notify MD with 3 lb weight gain over night or 5 lb in a week) -Follow CHF Action Plan -Adhere to low sodium diet   -Patient weight 274 lbs.   Reinforced heart failure management. - Patient currently wearing compression hose and patient states swelling is better -Blood sugar highest 154 per patient.  Reinforced diabetes management.            SDOH assessments and interventions completed:  Yes     Care Coordination Interventions:  Yes, provided   Follow up plan: Follow up call scheduled for September    Encounter Outcome:  Pt. Visit Completed   Bary Leriche, RN, MSN [facility] Care Management Care Management Coordinator Direct Line [phone]

## 2022-09-19 ENCOUNTER — Ambulatory Visit (HOSPITAL_COMMUNITY): Payer: 59

## 2022-09-22 ENCOUNTER — Other Ambulatory Visit: Payer: Self-pay | Admitting: Family Medicine

## 2022-09-22 ENCOUNTER — Other Ambulatory Visit: Payer: Self-pay

## 2022-09-22 MED ORDER — BD PEN NEEDLE SHORT U/F 31G X 8 MM MISC
5 refills | Status: AC
  Filled 2022-09-22: qty 100, fill #0

## 2022-09-23 MED ORDER — SILVER SULFADIAZINE 1 % EX CREA: TOPICAL_CREAM | CUTANEOUS | 0 refills | Status: DC

## 2022-09-23 NOTE — Telephone Encounter (Signed)
This needs to go to his endocrinologist.  [name]. [name], MD 09/23/2022 9:56 AM

## 2022-09-23 NOTE — Telephone Encounter (Signed)
Last ordered: 1 year ago (08/05/2021) by Historical Provider, MD Theron Arista 07/03/2022

## 2022-09-30 ENCOUNTER — Telehealth: Payer: Self-pay

## 2022-09-30 NOTE — Patient Outreach (Signed)
  Care Coordination   Follow Up Visit Note   09/30/2022 Name: [name] [name] is a 67 y.o. year old male who sees [name] Dark, MD for primary care. I spoke with  [name] [name] by phone today.  What matters to the patients health and wellness today?  Getting medications    Goals Addressed             This Visit's Progress    Heart Failure and Diabetes Management       Patient Goals/Self Care Activities: -Patient/Caregiver will self-administer medications as prescribed as evidenced by self-report/primary caregiver report  -Patient/Caregiver will attend all scheduled provider appointments as evidenced by clinician review of documented attendance to scheduled appointments and patient/caregiver report -Patient/Caregiver will call provider office for new concerns or questions as evidenced by review of documented incoming telephone call notes and patient report  -wear comfortable, well-fitting shoes -Weigh daily and record (notify MD with 3 lb weight gain over night or 5 lb in a week) -Follow CHF Action Plan -Adhere to low sodium diet   -Patient weight 274 lbs.   Reinforced heart failure management. - Patient currently wearing compression hose and patient states swelling is better -Blood sugar highest 154 per patient.  Reinforced diabetes management.   09/30/22 Patient having problems getting medication from [facility] pharmacy. Patient prefers pill packs.  Patient given information to contact [facility] and [facility].  Patient states he will contact them. Advised to contact RN CM if he has trouble. He verbalized understanding.             SDOH assessments and interventions completed:  Yes     Care Coordination Interventions:  Yes, provided   Follow up plan: Follow up call scheduled for September    Encounter Outcome:  Pt. Visit Completed   Bary Leriche, RN, MSN [facility] Care Management Care Management Coordinator Direct  Line [phone]

## 2022-09-30 NOTE — Patient Instructions (Signed)
Visit Information  Thank you for taking time to visit with me today. Please don't hesitate to contact me if I can be of assistance to you.   Following are the goals we discussed today:   Goals Addressed             This Visit's Progress    Heart Failure and Diabetes Management       Patient Goals/Self Care Activities: -Patient/Caregiver will self-administer medications as prescribed as evidenced by self-report/primary caregiver report  -Patient/Caregiver will attend all scheduled provider appointments as evidenced by clinician review of documented attendance to scheduled appointments and patient/caregiver report -Patient/Caregiver will call provider office for new concerns or questions as evidenced by review of documented incoming telephone call notes and patient report  -wear comfortable, well-fitting shoes -Weigh daily and record (notify MD with 3 lb weight gain over night or 5 lb in a week) -Follow CHF Action Plan -Adhere to low sodium diet   -Patient weight 274 lbs.   Reinforced heart failure management. - Patient currently wearing compression hose and patient states swelling is better -Blood sugar highest 154 per patient.  Reinforced diabetes management.   09/30/22 Patient having problems getting medication from [facility] pharmacy. Patient prefers pill packs.  Patient given information to contact [facility] and [facility].  Patient states he will contact them. Advised to contact RN CM if he has trouble. He verbalized understanding.             Our next appointment is by telephone on 10/30/22 at 1030 am  Please call the care guide team at [phone] if you need to cancel or reschedule your appointment.   If you are experiencing a Mental Health or Behavioral Health Crisis or need someone to talk to, please call the Suicide and Crisis Lifeline: 988   Patient verbalizes understanding of instructions and care plan provided today and agrees to view in MyChart. Active MyChart  status and patient understanding of how to access instructions and care plan via MyChart confirmed with patient.     The patient has been provided with contact information for the care management team and has been advised to call with any health related questions or concerns.   Bary Leriche, RN, MSN [facility] Care Management Care Management Coordinator Direct Line [phone]

## 2022-09-30 NOTE — Progress Notes (Deleted)
Cardiology Office Note:    Date:  09/30/2022  ID:  [name] [name], DOB 07-17-1955, MRN [id number] PCP: [name], MD  [facility] Providers Cardiologist:  Donato Schultz, MD { Click to update primary MD,subspecialty MD or APP then REFRESH:1}    {Click to Open Review  :1}   Patient Profile:      Coronary artery disease  Hx of MI s/p CABG in 2005 Myoview 10/15: low risk  (HFpEF) heart failure with preserved ejection fraction  Intol of SGLT2i 2/2 yeast infection  TTE 05/07/2017: EF 50-55, mild MR, mild RVE, moderate RAE  R sided HF Admx 04/2022  TTE 04/17/22: EF 55-60, no RWMA, D shaped LV c/w RV vol overload, severely reduced RVSF, NL PASP, RVSP 26.2, mild LAE, severe RAE, mild MR, torrential TR, AV sclerosis TEE 04/25/22: AV veg vs Lambl's excrescence, wide open TR, EF 55-60, no RWMA, mod reduced RVSF, severe RAE, mild MR, severe TR, trivial AI, Ao root 39 mm, Grade II plaque  Severe TR Hx of ectopic atrial rhythm - eval by EP (Dr. Elberta Fortis) in the past  Right Bundle Branch Block   Peripheral arterial disease (followed by [facility]) ABIs 08/06/2018: R 0.85; L 0.88  Hx of pulmonary embolism 04/2022  UE [place] + for acute superficial thrombosis R arm Chronic kidney disease  Diabetes mellitus  Hypertension  Hyperlipidemia  Morbid obesity  Venous insufficiency  Hx of pancreatitis  Hepatic Cirrhosis  Hx of ETOH abuse  OSA         {  Follow-up of CAD CHF tricuspid regurgitation Admx March 2024 with hypoxic respiratory failure in the setting of right-sided heart failure and severe TR and strep pyogenes bacteremia, complicated by AKI on CKD and right upper lobe pulmonary embolism Last seen May 2024 ?  RV dysfunction and TR related to pulmonary embolism-Limited echo was ordered to reassess TR and RV dysfunction      :1}     Discussed the use of AI scribe software for clinical note transcription with the patient, who gave verbal consent to proceed.  History of Present Illness           ROS: ***    Studies Reviewed:       *** Risk Assessment/Calculations:   {Does this patient have ATRIAL FIBRILLATION?:[phone]} No BP recorded.  {Refresh Note OR Click here to enter BP  :1}***       Physical Exam:   VS:  There were no vitals taken for this visit.   Wt Readings from Last 3 Encounters:  07/02/22 282 lb 6.4 oz (128.1 kg)  06/13/22 279 lb (126.6 kg)  05/30/22 285 lb 3.2 oz (129.4 kg)    Physical Exam***     Assessment and Plan:  No problem-specific Assessment & Plan notes found for this encounter. Assessment and Plan          { Chronic right-sided heart failure (HCC) NYHA II. Volume status stable. He still has persistent edema in his legs. We discussed management of this to include diuretics + compression. He had severe TR on his Echocardiogram and TEE. This was in the setting of acute PE. There was a question of whether the RV dysfunction and TR were related to his PE. I recommend repeating an echocardiogram 3 mos after starting anticoagulation to see if this has improved any. If not, it may be worth having him see our structural heart team for evaluation.  Continue torsemide 10 mg daily, spironolactone 12.5 mg daily. Obtain BMET  today. If renal function potassium stable, consider increasing torsemide to 20 mg daily. Arrange limited echo in 1 month to recheck RV function, tricuspid regurgitation Follow-up 3 months.   Severe tricuspid regurgitation As noted, question if this may be related to acute pulmonary embolism.  Follow-up limited echocardiogram will be obtained in 1 month to recheck RV function and tricuspid regurgitation.  Consider referral to structural heart clinic if tricuspid regurgitation remains severe.   Coronary artery disease of bypass graft of native heart with stable angina pectoris (HCC) History of MI status post CABG in 2005.  Myoview in 2015 was low risk.  He is not having symptoms to suggest angina.  Continue ASA 81 mg daily, metoprolol  tartrate 12.5 mg twice daily, nitroglycerin as needed, Crestor 20 mg daily.   Ectopic atrial tachycardia (HCC) Continue metoprolol tartrate 12.5 mg twice daily.   HTN (hypertension) Blood pressure is well-controlled.  Continue metoprolol tartrate 12.5 mg twice daily, spironolactone 12.5 mg daily.  He was previously on losartan.  Blood pressure is currently soft and would not likely tolerate the addition of further antihypertensives.   HLD (hyperlipidemia) LDL optimal in February 2024 (53).  Continue Crestor 20 mg daily.   Pulmonary embolism (HCC) He remains on Eliquis.  Management per primary care.   Obstructive sleep apnea He has had difficulty getting equipment and has not been able to use his CPAP.  I have asked him to follow-up with his pulmonologist to see if he can provide assistance.     :1}    {Are you ordering a CV Procedure (e.g. stress test, cath, DCCV, TEE, etc)?   Press F2        :[id number]}  Dispo:  No follow-ups on file.  Signed, Tereso Newcomer, PA-C

## 2022-10-01 ENCOUNTER — Ambulatory Visit: Payer: Medicare HMO | Attending: Physician Assistant | Admitting: Physician Assistant

## 2022-10-01 DIAGNOSIS — I071 Rheumatic tricuspid insufficiency: Secondary | ICD-10-CM

## 2022-10-01 DIAGNOSIS — I25708 Atherosclerosis of coronary artery bypass graft(s), unspecified, with other forms of angina pectoris: Secondary | ICD-10-CM

## 2022-10-01 DIAGNOSIS — I50812 Chronic right heart failure: Secondary | ICD-10-CM

## 2022-10-03 ENCOUNTER — Telehealth: Payer: Self-pay

## 2022-10-03 NOTE — Patient Instructions (Signed)
Visit Information  Thank you for taking time to visit with me today. Please don't hesitate to contact me if I can be of assistance to you.   Following are the goals we discussed today:   Goals Addressed             This Visit's Progress    Heart Failure and Diabetes Management       Patient Goals/Self Care Activities: -Patient/Caregiver will self-administer medications as prescribed as evidenced by self-report/primary caregiver report  -Patient/Caregiver will attend all scheduled provider appointments as evidenced by clinician review of documented attendance to scheduled appointments and patient/caregiver report -Patient/Caregiver will call provider office for new concerns or questions as evidenced by review of documented incoming telephone call notes and patient report  -wear comfortable, well-fitting shoes -Weigh daily and record (notify MD with 3 lb weight gain over night or 5 lb in a week) -Follow CHF Action Plan -Adhere to low sodium diet   -Patient weight 274 lbs.   Reinforced heart failure management. - Patient currently wearing compression hose and patient states swelling is better -Blood sugar highest 154 per patient.  Reinforced diabetes management.   09/30/22 Patient having problems getting medication from [facility] pharmacy. Patient prefers pill packs.  Patient given information to contact [facility].  Patient states he will contact them. Advised to contact RN CM if he has trouble. He verbalized understanding.    10/03/22 spoke with patient. He states he got things straight about his medications. He states that [facility] called him and delivered his medications.  He also states that he is going with [facility] for medications after [facility] stops. No concerns.           Our next appointment is by telephone on 10/30/22 at 1030 am  Please call the care guide team at [phone] if you need to cancel or reschedule your appointment.   If you are  experiencing a Mental Health or Behavioral Health Crisis or need someone to talk to, please call the Suicide and Crisis Lifeline: 988   Patient verbalizes understanding of instructions and care plan provided today and agrees to view in MyChart. Active MyChart status and patient understanding of how to access instructions and care plan via MyChart confirmed with patient.     The patient has been provided with contact information for the care management team and has been advised to call with any health related questions or concerns.   Bary Leriche, RN, MSN [facility], [facility] Management Community Coordinator Direct Dial: [phone]  Fax: [phone] Website: [email or web address]

## 2022-10-03 NOTE — Patient Outreach (Signed)
  Care Coordination   Follow Up Visit Note   10/03/2022 Name: [name] [name] MRN: [id number] DOB: 11-08-1955  [name] [name] is a 67 y.o. year old male who sees [name], MD for primary care. I spoke with  [name] by phone today.  What matters to the patients health and wellness today?  Medications    Goals Addressed             This Visit's Progress    Heart Failure and Diabetes Management       Patient Goals/Self Care Activities: -Patient/Caregiver will self-administer medications as prescribed as evidenced by self-report/primary caregiver report  -Patient/Caregiver will attend all scheduled provider appointments as evidenced by clinician review of documented attendance to scheduled appointments and patient/caregiver report -Patient/Caregiver will call provider office for new concerns or questions as evidenced by review of documented incoming telephone call notes and patient report  -wear comfortable, well-fitting shoes -Weigh daily and record (notify MD with 3 lb weight gain over night or 5 lb in a week) -Follow CHF Action Plan -Adhere to low sodium diet   -Patient weight 274 lbs.   Reinforced heart failure management. - Patient currently wearing compression hose and patient states swelling is better -Blood sugar highest 154 per patient.  Reinforced diabetes management.   09/30/22 Patient having problems getting medication from [facility]. Patient prefers pill packs.  Patient given information to contact [facility] and [facility].  Patient states he will contact them. Advised to contact RN CM if he has trouble. He verbalized understanding.    10/03/22 spoke with patient. He states he got things straight about his medications. He states that [facility] called him and delivered his medications.  He also states that he is going with [facility] for medications after [facility] stops. No concerns.           SDOH assessments and interventions completed:   Yes     Care Coordination Interventions:  Yes, provided   Follow up plan: Follow up call scheduled for September    Encounter Outcome:  Pt. Visit Completed   Bary Leriche, RN, MSN [facility]  [facility], [facility] Management Community Coordinator Direct Dial: [phone]  Fax: [phone] Website: [email or web address]

## 2022-10-07 ENCOUNTER — Ambulatory Visit (HOSPITAL_COMMUNITY): Payer: Medicare HMO | Attending: Physician Assistant

## 2022-10-07 DIAGNOSIS — I50812 Chronic right heart failure: Secondary | ICD-10-CM | POA: Diagnosis not present

## 2022-10-07 LAB — ECHOCARDIOGRAM LIMITED
Area-P 1/2: 4.74 cm2
S' Lateral: 2.9 cm

## 2022-10-09 ENCOUNTER — Telehealth: Payer: Self-pay | Admitting: *Deleted

## 2022-10-09 DIAGNOSIS — I071 Rheumatic tricuspid insufficiency: Secondary | ICD-10-CM

## 2022-10-09 DIAGNOSIS — J45998 Other asthma: Secondary | ICD-10-CM | POA: Diagnosis not present

## 2022-10-09 NOTE — Telephone Encounter (Signed)
-----   Message from Tereso Newcomer sent at 10/08/2022  5:29 PM EDT ----- Results sent to [name] [name] via MyChart. See MyChart comments below. I will send a copy to [name], MD as Lorain Childes. PLAN:  -Refer to Dr. Lynnette Caffey in the structural heart clinic for tricuspid regurgitation, right sided heart failure.  [name]  Your echocardiogram demonstrates normal heart function (ejection fraction).  You continue to have significant leakage of the tricuspid valve (tricuspid valve regurgitation).  I discussed your echocardiogram with Dr. Anne Fu.  We would like to refer you to our cardiologist that specializes in structural heart disease (leaking valves).  Our office will arrange. Tereso Newcomer, PA-C

## 2022-10-11 ENCOUNTER — Other Ambulatory Visit: Payer: Self-pay | Admitting: Physician Assistant

## 2022-10-14 ENCOUNTER — Telehealth: Payer: Self-pay

## 2022-10-14 ENCOUNTER — Other Ambulatory Visit (HOSPITAL_COMMUNITY): Payer: Self-pay

## 2022-10-14 ENCOUNTER — Other Ambulatory Visit: Payer: Self-pay | Admitting: Family Medicine

## 2022-10-14 ENCOUNTER — Telehealth: Payer: Self-pay | Admitting: *Deleted

## 2022-10-14 DIAGNOSIS — I071 Rheumatic tricuspid insufficiency: Secondary | ICD-10-CM

## 2022-10-14 DIAGNOSIS — Z79899 Other long term (current) drug therapy: Secondary | ICD-10-CM

## 2022-10-14 MED ORDER — ONETOUCH ULTRA VI STRP: ORAL_STRIP | 0 refills | Status: DC

## 2022-10-14 NOTE — Telephone Encounter (Signed)
Please see info on PA insurance requesting preferred Accu-Check or True Metrix and advise

## 2022-10-14 NOTE — Telephone Encounter (Signed)
*  Primary  Pharmacy Patient Advocate Encounter   Received notification from Fax that prior authorization for One Touch Ultra Blue  is required/requested.   Insurance verification completed.   The patient is insured through [employer] [place] .   Per test claim:  Accu-Chek or True Metrix is preferred by the insurance.  If suggested medication is appropriate, Please send in a new RX and discontinue this one. If not, please advise as to why it's not appropriate so that we may request a Prior Authorization.

## 2022-10-14 NOTE — Telephone Encounter (Signed)
-----   Message from [place] sent at 10/10/2022  1:06 PM EDT ----- Regarding: FW: TR referral Please refer to [facility] for severe tricuspid regurgitation, right sided heart failure. Tereso Newcomer, PA-C    10/10/2022 1:07 PM ----- Message ----- From: Orbie Pyo, MD Sent: 10/09/2022  12:47 PM EDT To: Jake Bathe, MD; Beatrice Lecher, PA-C; # Subject: RE: TR referral                                I would start with AHF first, he will need a RHC to characterize PHTN ----- Message ----- From: Kennon Rounds Sent: 10/09/2022  12:36 PM EDT To: Jake Bathe, MD; Henrietta Dine, RN; # Subject: RE: TR referral                                Thanks. Adding Mark in. Loraine Leriche, should we have him see CHF here first or send to [facility]? ----- Message ----- From: Henrietta Dine, RN Sent: 10/09/2022  12:27 PM EDT To: Beatrice Lecher, PA-C; Orbie Pyo, MD Subject: TR referral                                    Hey guys!  Lorin Picket - I got your referral to Dr. Lynnette Caffey for TR. We are not #currently# doing any tricuspid interventions (and I'm not sure when we will given CRNA issues). We have historically referred these patients to [facility] in [place] [employer]. I do not want to delay treatment for this patient, so I wanted to send a message to you and to AT for thoughts. I am happy to facilitate anything needed.  Thank you both for your input!  KK

## 2022-10-14 NOTE — Telephone Encounter (Signed)
Ok to send in preferred alternative.  [name]. [name], MD 10/14/2022 2:38 PM

## 2022-10-15 ENCOUNTER — Ambulatory Visit (HOSPITAL_COMMUNITY)
Admission: RE | Admit: 2022-10-15 | Discharge: 2022-10-15 | Disposition: A | Discharge status: Home / Self Care | Payer: Medicare HMO | Source: Ambulatory Visit | Attending: Vascular Surgery | Admitting: Vascular Surgery

## 2022-10-15 ENCOUNTER — Other Ambulatory Visit (HOSPITAL_COMMUNITY): Payer: Self-pay | Admitting: Physician Assistant

## 2022-10-15 ENCOUNTER — Other Ambulatory Visit: Payer: Self-pay | Admitting: *Deleted

## 2022-10-15 DIAGNOSIS — R52 Pain, unspecified: Secondary | ICD-10-CM | POA: Diagnosis not present

## 2022-10-15 DIAGNOSIS — M79662 Pain in left lower leg: Secondary | ICD-10-CM | POA: Diagnosis not present

## 2022-10-15 MED ORDER — LANCET DEVICE MISC: 1.0000 | Freq: Three times a day (TID) | 0 refills | Status: AC

## 2022-10-15 MED ORDER — LANCETS MISC. MISC: 1.0000 | Freq: Three times a day (TID) | 0 refills | Status: AC

## 2022-10-15 MED ORDER — BLOOD GLUCOSE MONITORING SUPPL DEVI: 1.0000 | Freq: Three times a day (TID) | 0 refills | Status: AC

## 2022-10-15 MED ORDER — BLOOD GLUCOSE TEST VI STRP
1.0000 | ORAL_STRIP | Freq: Three times a day (TID) | 0 refills | Status: AC
Start: 2022-10-15 — End: 2022-11-14

## 2022-10-15 NOTE — Telephone Encounter (Signed)
Patient called back stating he needed the prescriptions to be for a 3 months supply.

## 2022-10-15 NOTE — Telephone Encounter (Signed)
New Rx send to pharmacy Glucometer lancets and strip  Patient notified

## 2022-10-15 NOTE — Telephone Encounter (Signed)
Pharmacy keeps saying they only received a 1 month supply and pt needs 3 months supply, 800 strips. Please advise.

## 2022-10-15 NOTE — Telephone Encounter (Signed)
Rx send into pharmacy

## 2022-10-16 ENCOUNTER — Other Ambulatory Visit: Payer: Self-pay | Admitting: *Deleted

## 2022-10-16 DIAGNOSIS — Z8719 Personal history of other diseases of the digestive system: Secondary | ICD-10-CM | POA: Diagnosis not present

## 2022-10-16 DIAGNOSIS — Z794 Long term (current) use of insulin: Secondary | ICD-10-CM | POA: Diagnosis not present

## 2022-10-16 DIAGNOSIS — E1165 Type 2 diabetes mellitus with hyperglycemia: Secondary | ICD-10-CM | POA: Diagnosis not present

## 2022-10-16 DIAGNOSIS — E1142 Type 2 diabetes mellitus with diabetic polyneuropathy: Secondary | ICD-10-CM | POA: Diagnosis not present

## 2022-10-16 MED ORDER — BLOOD GLUCOSE TEST VI STRP: 1.0000 | ORAL_STRIP | Freq: Three times a day (TID) | 0 refills | Status: AC

## 2022-10-16 NOTE — Telephone Encounter (Signed)
Rx send with 3 month supply

## 2022-10-18 NOTE — Progress Notes (Deleted)
HPI  male former smoker followed for OSA, chronic bronchitis, SAR, CAD/MI/CABG/ CHF, HBP, DM, cirrhosis liver, Continues oxygen 2 L for sleep/ [employer]. PFT 06/18/2011-moderate restriction. Flows are normal for measure volume with FEV1/FVC 0.84 and insignificant response to bronchodilator. DLCO 52%. HST-08/05/2016-AHI 37.5/hour, desaturation to 67%, body weight 294 pounds HST 09/08/18- AHI 32.4/ hr, desaturation to 73%- avg 89%, body weight 298 lbs -------------------------------------------------------------------------------    01/16/21-  67 year old male former smoker(45 pk yrs) followed for OSA, chronic bronchitis, SAR, CAD/MI/CABG, dCHF, DM2, PVD, hepatic cirrhosis, Morbid Obesity, Covid infection July 2022,  CPAP auto 5-20/ [employer]  Ordered 11/01/2018 Download- compliance Body weight today-309 lbs Covid vax-2 phizer                          Flu vax-had LOV NP Clent Ridges 11/7- bronchitis, rhinitis> flonase, zyrtec, saline spray, pred taper, mucinex, PFT -----C/o cough-white since July 2022 with covid PFT scheduled for 12/19. Not using CPAP or O2- in storage since Spring. He says his nebulizer machine and his CPAP machine are in the back of the storage unit since he moved.  I emphasized that it was time to go in and pull those out, resume CPAP use and get ready for winter. He describes some persistent cough with scant white sputum since COVID infection in July.  Cough is noticed most when lying down or if he feels hot.  He does not seem to be coughing with food or drink and he does not notice wheeze. He indicates a lymph node under the angle of right mandible that he can feel sometimes, nontender.  I have asked him to point this out to his primary physician.  He does not think it is growing and is not aware of any others. CXR 12/17/20 IMPRESSION: Central pulmonary vascular prominence and persistent mild central peribronchial thickening. No new airspace disease.  10/21/22- 67 year old male former  smoker(45 pk yrs) followed for OSA, chronic bronchitis, SAR, CAD/MI/CABG, dCHF, Tricuspid Regurg, DM2, PVD, hepatic cirrhosis, Morbid Obesity, Covid infection July 2022, hx PE/Eliquis,  CPAP auto 5-20/ [employer]  Ordered 11/01/2018 Download- compliance- Body weight today- LOV Cobb, NP   ROS-see HPI   + = positive Constitutional:   No-   weight loss, night sweats, fevers, chills, + fatigue, lassitude. HEENT:   No-  headaches, difficulty swallowing, tooth/dental problems, sore throat,       No-  sneezing, itching, ear ache, nasal congestion, post nasal drip,  CV:  No-   chest pain, orthopnea, PND, + swelling in lower extremities, anasarca,  dizziness, palpitations Resp: + Shortness of breath with exertion or at rest.              No-   productive cough,  No non-productive cough,  No- coughing up of blood.              No-   change in color of mucus.  + Occasional wheezing.   Skin: No-   rash or lesions. GI:  No-   heartburn, indigestion, abdominal pain, nausea, vomiting,  GU: . MS:  No-   joint pain or swelling.   Neuro-     nothing unusual Psych:  No- change in mood or affect. No depression or anxiety.  No memory loss.   OBJ- Physical Exam General- Alert, Oriented, Affect-appropriate, Distress- none acute, +obese,  Skin- +stasis changes legs, + Increased skin turgor? Edema Lymphadenopathy- + thick neck.  I feel some fullness in the area indicated and have  asked him to show this to his PCP. Head- atraumatic            Eyes- Gross vision intact, PERRLA, conjunctivae and secretions clear            Ears- Hearing, canals-normal            Nose- Clear, no-Septal dev, mucus, polyps, erosion, perforation             Throat- Mallampati IV , mucosa clear , drainage- none, tonsils- atrophic, +missing teeth Neck- flexible , trachea midline, no stridor , thyroid nl, carotid no bruit Chest - symmetrical excursion , unlabored           Heart/CV- RRR , no murmur , no gallop  , no rub, nl s1 s2                            - JVD- none , edema- none, stasis changes- none, varices- none           Lung- clear to P&A, wheeze- none, cough- none , dullness-none, rub- none           Chest wall- sternotomy scar Abd-  Br/ Gen/ Rectal- Not done, not indicated Extrem- cyanosis- none, clubbing, none, atrophy- none, strength- nl. Vascular graft donor site scar left forearm. + Cane, + chronic brawny peripheral edema w stasis changes in calves. Neuro- grossly intact to observation

## 2022-10-21 ENCOUNTER — Ambulatory Visit: Payer: 59 | Admitting: Internal Medicine

## 2022-10-21 ENCOUNTER — Other Ambulatory Visit (HOSPITAL_COMMUNITY): Payer: Self-pay

## 2022-10-27 ENCOUNTER — Encounter: Payer: Self-pay | Admitting: Internal Medicine

## 2022-10-30 ENCOUNTER — Ambulatory Visit: Payer: Self-pay

## 2022-10-30 NOTE — Patient Instructions (Signed)
Visit Information  Thank you for taking time to visit with me today. Please don't hesitate to contact me if I can be of assistance to you.   Following are the goals we discussed today:   Goals Addressed             This Visit's Progress    Heart Failure and Diabetes Management       Patient Goals/Self Care Activities: -Patient/Caregiver will self-administer medications as prescribed as evidenced by self-report/primary caregiver report  -Patient/Caregiver will attend all scheduled provider appointments as evidenced by clinician review of documented attendance to scheduled appointments and patient/caregiver report -Patient/Caregiver will call provider office for new concerns or questions as evidenced by review of documented incoming telephone call notes and patient report  -wear comfortable, well-fitting shoes -Weigh daily and record (notify MD with 3 lb weight gain over night or 5 lb in a week) -Follow CHF Action Plan -Adhere to low sodium diet   -Patient weight 272 lbs.   Reiterated heart failure management. - Patient currently wearing compression hose and patient states swelling is better -Blood sugar highest 165 per patient.  Reiterated diabetes management.   09/30/22 Patient having problems getting medication from [facility]. Patient prefers pill packs.  Patient given information [facility]  Patient states he will contact them. Advised to contact RN CM if he has trouble. He verbalized understanding.    10/03/22 spoke with patient. He states he got things straight about his medications. He states that [facility] called him and delivered his medications.  He also states that he is going with [facility] for medications after [facility] stops. No concerns.          Our next appointment is by telephone on 11/27/22 at 1030 am  Please call the care guide team at [phone] if you need to cancel or reschedule your appointment.   If you are  experiencing a Mental Health or Behavioral Health Crisis or need someone to talk to, please call the Suicide and Crisis Lifeline: 988   Patient verbalizes understanding of instructions and care plan provided today and agrees to view in MyChart. Active MyChart status and patient understanding of how to access instructions and care plan via MyChart confirmed with patient.     The patient has been provided with contact information for the care management team and has been advised to call with any health related questions or concerns.   Bary Leriche, RN, MSN [facility], [facility] Management Community Coordinator Direct Dial: [phone]  Fax: [phone] Website: [email or web address]

## 2022-11-03 DIAGNOSIS — E119 Type 2 diabetes mellitus without complications: Secondary | ICD-10-CM | POA: Diagnosis not present

## 2022-11-03 DIAGNOSIS — Z961 Presence of intraocular lens: Secondary | ICD-10-CM | POA: Diagnosis not present

## 2022-11-03 LAB — HM DIABETES EYE EXAM

## 2022-11-12 NOTE — Progress Notes (Signed)
[name] [name] is a 67 y.o. male here for a follow up of a new problem. History of Present Illness:   No chief complaint on file.   HPI Infected Wound: Complains of a posterior leg wound that has become infected.   ***  ***  ***  Past Medical History:  Diagnosis Date   Acute myocardial infarction, unspecified site, episode of care unspecified 2005   Acute pancreatitis    CAD (coronary artery disease)    a. CABG in 2005 w LIMA to LAD, left radial to second circumflex marginal, saphenous vein graft to PDA, saphenous vein graft to lateral subbrach of ramus intermediate, and sequential saphenous vein graft to the medial subbranch of ramus intermediate.   Chronic diastolic CHF (congestive heart failure) (HCC)    Cirrhosis of liver without mention of alcohol    CKD (chronic kidney disease), stage II    Complications affecting other specified body systems, hypertension    Ectopic atrial rhythm    Essential hypertension    Hyperlipidemia    Hypokalemia    a. intermitttent noncompliance with potassium supplement.   Hypomagnesemia    Morbid obesity (HCC)    Neuropathy    Other and unspecified hyperlipidemia    Proteinuria    RBBB    Sleep apnea    Type II or unspecified type diabetes mellitus without mention of complication, not stated as uncontrolled    Varicose veins of both lower extremities     Social History   Tobacco Use   Smoking status: Former    Current packs/day: 0.00    Average packs/day: 1.5 packs/day for 30.0 years (45.0 ttl pk-yrs)    Types: Cigarettes    Start date: 10/21/1974    Quit date: 10/20/2004    Years since quitting: 18.0    Passive exposure: Never   Smokeless tobacco: Never  Vaping Use   Vaping status: Never Used  Substance Use Topics   Alcohol use: Yes    Comment: occasional   Drug use: No   Past Surgical History:  Procedure Laterality Date   CORONARY ARTERY BYPASS GRAFT     2005   I & D EXTREMITY Left 09/02/2019   Procedure: IRRIGATION  AND DEBRIDEMENT 4th TOE DEBRIDIMENT;  Surgeon: Park Liter, DPM;  Location: [facility];  Service: Podiatry;  Laterality: Left;   TEE WITHOUT CARDIOVERSION N/A 04/25/2022   Procedure: TRANSESOPHAGEAL ECHOCARDIOGRAM (TEE);  Surgeon: Lewayne Bunting, MD;  Location: [facility] ENDOSCOPY;  Service: Cardiovascular;  Laterality: N/A;   Family History  Problem Relation Age of Onset   Breast cancer Mother    Prostate cancer Father    Coronary artery disease Father        underwent CABG    Allergies  Allergen Reactions   Empagliflozin Rash    Patient reports yeast.    Ibuprofen Other (See Comments)    Stomach pain   Current Medications:   Current Outpatient Medications:    aspirin EC 81 MG tablet, Take 81 mg by mouth daily., Disp: , Rfl:    B-D ULTRAFINE III SHORT PEN 31G X 8 MM MISC, USE AS DIRECTED, Disp: 100 each, Rfl: 5   Blood Glucose Monitoring Suppl DEVI, 1 each by Does not apply route in the morning, at noon, and at bedtime. May substitute to any manufacturer covered by patient's insurance.Dx E11.69, Disp: 1 each, Rfl: 0   diclofenac Sodium (VOLTAREN) 1 % GEL, APPLY 2 GRAMS TOPICALLY 4 TIMES DAILY TO KNEE, Disp: 100 g, Rfl:  2   doxycycline (VIBRA-TABS) 100 MG tablet, Take 1 tablet (100 mg total) by mouth 2 (two) times daily., Disp: 14 tablet, Rfl: 0   ELIQUIS 5 MG TABS tablet, TAKE ONE TABLET BY MOUTH TWICE DAILY, Disp: 60 tablet, Rfl: 1   famotidine (PEPCID) 20 MG tablet, Take 1 tablet (20 mg total) by mouth daily as needed for heartburn., Disp: 90 tablet, Rfl: 2   fluticasone (FLONASE) 50 MCG/ACT nasal spray, Place 1 spray into both nostrils daily., Disp: 16 g, Rfl: 12   Fluticasone-Umeclidin-Vilant (TRELEGY ELLIPTA) 100-62.5-25 MCG/ACT AEPB, INHALE 1 PUFF INTO THE LUNGS DAILY, Disp: 180 each, Rfl: 0   Glucose Blood (BLOOD GLUCOSE TEST STRIPS) STRP, 1 each by In Vitro route in the morning, at noon, and at bedtime. May substitute to any manufacturer covered by patient's insurance., Disp: 800  strip, Rfl: 0   HUMALOG 100 UNIT/ML injection, Inject into the skin See admin instructions. Per SSI, Disp: , Rfl:    hydroxypropyl methylcellulose / hypromellose (ISOPTO TEARS / GONIOVISC) 2.5 % ophthalmic solution, Place 2 drops into both eyes as needed for dry eyes., Disp: 15 mL, Rfl: 12   Lancet Device MISC, 1 each by Does not apply route in the morning, at noon, and at bedtime. May substitute to any manufacturer covered by patient's insurance., Disp: 1 each, Rfl: 0   Lancets Misc. MISC, 1 each by Does not apply route in the morning, at noon, and at bedtime. May substitute to any manufacturer covered by patient's insurance., Disp: 100 each, Rfl: 0   LANTUS SOLOSTAR 100 UNIT/ML Solostar Pen, Inject 20 Units into the skin every morning., Disp: 15 mL, Rfl: 11   lidocaine (LIDODERM) 5 %, Place 1 patch onto the skin daily. Remove & Discard patch within 12 hours or as directed by MD, Disp: 5 patch, Rfl: 0   LINZESS 290 MCG CAPS capsule, TAKE 1 CAPSULE(290 MCG) BY MOUTH DAILY BEFORE BREAKFAST, Disp: 30 capsule, Rfl: 0   MAGNESIUM-OXIDE 400 (241.3 Mg) MG tablet, Take 1 tablet by mouth daily., Disp: , Rfl:    metoprolol tartrate (LOPRESSOR) 25 MG tablet, TAKE 1/2 TABLET(12.5 MG) BY MOUTH TWICE DAILY, Disp: 90 tablet, Rfl: 3   nitroGLYCERIN (NITROSTAT) 0.4 MG SL tablet, Place 1 tablet (0.4 mg total) under the tongue every 5 (five) minutes as needed for chest pain., Disp: 25 tablet, Rfl: 3   polyethylene glycol (MIRALAX / GLYCOLAX) 17 g packet, Take 17 g by mouth daily., Disp: 14 each, Rfl: 0   potassium chloride SA (KLOR-CON M) 20 MEQ tablet, Take 2 tablets (40 mEq total) by mouth 2 (two) times daily., Disp: 120 tablet, Rfl: 11   PROAIR HFA 108 (90 Base) MCG/ACT inhaler, Inhale 2 puffs into the lungs every 6 (six) hours as needed for wheezing or shortness of breath., Disp: 54 g, Rfl: 3   rosuvastatin (CRESTOR) 20 MG tablet, TAKE ONE TABLET BY MOUTH EVERY EVENING, Disp: 90 tablet, Rfl: 1   silver sulfADIAZINE  (SSD) 1 % cream, APPLY PEA SIZED AMOUNT TOPICALLY TO WOUND DAILY, Disp: 50 g, Rfl: 0   spironolactone (ALDACTONE) 25 MG tablet, Take 1 tablet (25 mg total) by mouth daily., Disp: 30 tablet, Rfl: 11   torsemide (DEMADEX) 20 MG tablet, TAKE 1 TABLET(20 MG) BY MOUTH TWICE DAILY, Disp: 180 tablet, Rfl: 2   Review of Systems:   ROS See pertinent positives and negatives as per the HPI.  Vitals:   There were no vitals filed for this visit.   There  is no height or weight on file to calculate BMI.  Physical Exam:   Physical Exam  Assessment and Plan:   There are no diagnoses linked to this encounter.            I,Emily Lagle,acting as a Neurosurgeon for [employer], PA.,have documented all relevant documentation on the behalf of Jarold Motto, PA,as directed by  Jarold Motto, PA while in the presence of Jarold Motto, [place].  *** (refresh reminder)  I, Jarold Motto, PA, have reviewed all documentation for this visit. The documentation on 11/12/22 for the exam, diagnosis, procedures, and orders are all accurate and complete.  Jarold Motto, PA-C

## 2022-11-13 ENCOUNTER — Ambulatory Visit (INDEPENDENT_AMBULATORY_CARE_PROVIDER_SITE_OTHER): Payer: 59 | Admitting: Physician Assistant

## 2022-11-13 ENCOUNTER — Encounter: Payer: Self-pay | Admitting: Physician Assistant

## 2022-11-13 VITALS — BP 120/66 | HR 75 | Temp 98.0°F | Ht 71.0 in | Wt 276.0 lb

## 2022-11-13 DIAGNOSIS — M7989 Other specified soft tissue disorders: Secondary | ICD-10-CM | POA: Diagnosis not present

## 2022-11-13 DIAGNOSIS — M79605 Pain in left leg: Secondary | ICD-10-CM | POA: Diagnosis not present

## 2022-11-13 DIAGNOSIS — Z23 Encounter for immunization: Secondary | ICD-10-CM | POA: Diagnosis not present

## 2022-11-13 MED ORDER — DOXYCYCLINE HYCLATE 100 MG PO TABS: 100.0000 mg | ORAL_TABLET | Freq: Two times a day (BID) | ORAL | 0 refills | Status: DC

## 2022-11-13 NOTE — Patient Instructions (Signed)
It was great to see you!  Call cardiology and follow-up with them as soon as possible  Increase torsemide to three times daily until you can see cardiology  Elevate leg as much as possible  Start doxycycline to cover for infection  If any WORSENING SYMPTOM(S), go to the ER  Take care,  Jarold Motto PA-C

## 2022-11-14 ENCOUNTER — Telehealth: Payer: Self-pay | Admitting: Cardiology

## 2022-11-14 NOTE — Telephone Encounter (Signed)
Pt c/o medication issue:  1. Name of Medication: torsemide (DEMADEX) 20 MG tablet   2. How are you currently taking this medication (dosage and times per day)?    3. Are you having a reaction (difficulty breathing--STAT)? n  4. What is your medication issue? Patient states PCP wants patient to take extra dose of fluid pills. Patient wants to talk with the dr to make sure that's safe for him to do. Please advise

## 2022-11-14 NOTE — Telephone Encounter (Signed)
Called and spoke with pt who is concerned b/c his PCP advised him to increase his Torsemide 20 mg to TID.  He is wanting to know if this is OK.  Advised it is OK for him to increase dose  as instructed.  While speaking with him, hre reports he has only been taking 1/2 tablet twice a day (10 mg BID).  Advised pt to increase to 1/2 tablet TID or he can take 1 whole tablet (20 mg) and 1/2 tablet later in the day.  Advised to keep a daily wt log and to take it with him when he goes to his 11/27/22 appt in the CHF clinic.  He states understanding and will call back if any questions or concerns.

## 2022-11-16 ENCOUNTER — Other Ambulatory Visit: Payer: Self-pay | Admitting: Family Medicine

## 2022-11-19 ENCOUNTER — Other Ambulatory Visit: Payer: Self-pay | Admitting: Internal Medicine

## 2022-11-25 ENCOUNTER — Encounter (HOSPITAL_COMMUNITY): Payer: Medicare HMO | Admitting: Cardiology

## 2022-11-27 ENCOUNTER — Encounter (HOSPITAL_COMMUNITY): Payer: Medicare HMO | Admitting: Cardiology

## 2022-11-27 ENCOUNTER — Ambulatory Visit: Payer: Self-pay

## 2022-11-27 NOTE — Patient Instructions (Signed)
Visit Information  Thank you for taking time to visit with me today. Please don't hesitate to contact me if I can be of assistance to you.   Following are the goals we discussed today:   Goals Addressed             This Visit's Progress    Heart Failure and Diabetes Management       Patient Goals/Self Care Activities: -Patient/Caregiver will self-administer medications as prescribed as evidenced by self-report/primary caregiver report  -Patient/Caregiver will attend all scheduled provider appointments as evidenced by clinician review of documented attendance to scheduled appointments and patient/caregiver report -Patient/Caregiver will call provider office for new concerns or questions as evidenced by review of documented incoming telephone call notes and patient report  -wear comfortable, well-fitting shoes -Weigh daily and record (notify MD with 3 lb weight gain over night or 5 lb in a week) -Follow CHF Action Plan -Adhere to low sodium diet   -Patient weight 269 lbs.   Reviewed heart failure management. - Patient continues to wear compression hose and patient states swelling is better -Blood sugar highest 205 this morning after eating.  Reviewed diabetes management.  He reports he is supposed to have heart valves checked this month          Our next appointment is by telephone on 12/24/22 at 1030 am  Please call the care guide team at [phone] if you need to cancel or reschedule your appointment.   If you are experiencing a Mental Health or Behavioral Health Crisis or need someone to talk to, please call the Suicide and Crisis Lifeline: 988   Patient verbalizes understanding of instructions and care plan provided today and agrees to view in MyChart. Active MyChart status and patient understanding of how to access instructions and care plan via MyChart confirmed with patient.     The patient has been provided with contact information for the care management team and has  been advised to call with any health related questions or concerns.   Bary Leriche, RN, MSN [facility], [facility] Management Community Coordinator Direct Dial: [phone]  Fax: [phone] Website: [email or web address]

## 2022-11-27 NOTE — Patient Outreach (Signed)
Care Coordination   Follow Up Visit Note   11/27/2022 Name: [name] [name] MRN: [id number] DOB: 1955-11-24  [name] [name] is a 67 y.o. year old male who sees [name], MD for primary care. I spoke with  [name] [name] by phone today.  What matters to the patients health and wellness today?  Maintain health    Goals Addressed             This Visit's Progress    Heart Failure and Diabetes Management       Patient Goals/Self Care Activities: -Patient/Caregiver will self-administer medications as prescribed as evidenced by self-report/primary caregiver report  -Patient/Caregiver will attend all scheduled provider appointments as evidenced by clinician review of documented attendance to scheduled appointments and patient/caregiver report -Patient/Caregiver will call provider office for new concerns or questions as evidenced by review of documented incoming telephone call notes and patient report  -wear comfortable, well-fitting shoes -Weigh daily and record (notify MD with 3 lb weight gain over night or 5 lb in a week) -Follow CHF Action Plan -Adhere to low sodium diet   -Patient weight 269 lbs.   Reviewed heart failure management. - Patient continues to wear compression hose and patient states swelling is better -Blood sugar highest 205 this morning after eating.  Reviewed diabetes management.  He reports he is supposed to have heart valves checked this month          SDOH assessments and interventions completed:  Yes     Care Coordination Interventions:  Yes, provided   Follow up plan: Follow up call scheduled for November    Encounter Outcome:  Patient Visit Completed   Bary Leriche, RN, MSN [place]  [facility], [facility] Management Community Coordinator Direct Dial: [phone]  Fax: [phone] Website: [email or web address]

## 2022-11-28 ENCOUNTER — Telehealth: Payer: Self-pay | Admitting: Family Medicine

## 2022-11-28 ENCOUNTER — Other Ambulatory Visit: Payer: Self-pay | Admitting: Family Medicine

## 2022-11-28 ENCOUNTER — Other Ambulatory Visit: Payer: Self-pay | Admitting: *Deleted

## 2022-11-28 MED ORDER — APIXABAN 5 MG PO TABS: 5.0000 mg | ORAL_TABLET | Freq: Two times a day (BID) | ORAL | 1 refills | Status: DC

## 2022-11-28 NOTE — Telephone Encounter (Signed)
Prescription Request  11/28/2022  LOV: 06/13/2022  What is the name of the medication or equipment? ELIQUIS 5 MG TABS tablet   Have you contacted your pharmacy to request a refill? Yes   Which pharmacy would you like this sent to?   [facility] - [place], [place] - [address] [place] [address] Phone: [phone] Fax: [phone]    Patient notified that their request is being sent to the clinical staff for review and that they should receive a response within 2 business days.   Please advise at Mobile [phone] (mobile)

## 2022-11-28 NOTE — Telephone Encounter (Signed)
Rx send in

## 2022-12-04 ENCOUNTER — Ambulatory Visit (INDEPENDENT_AMBULATORY_CARE_PROVIDER_SITE_OTHER): Payer: 59

## 2022-12-04 VITALS — Wt 276.0 lb

## 2022-12-04 DIAGNOSIS — Z0000 Encounter for general adult medical examination without abnormal findings: Secondary | ICD-10-CM | POA: Diagnosis not present

## 2022-12-04 DIAGNOSIS — Z1211 Encounter for screening for malignant neoplasm of colon: Secondary | ICD-10-CM

## 2022-12-04 NOTE — Patient Instructions (Signed)
Mr. Witteman , Thank you for taking time to come for your Medicare Wellness Visit. I appreciate your ongoing commitment to your health goals. Please review the following plan we discussed and let me know if I can assist you in the future.   Referrals/Orders/Follow-Ups/Clinician Recommendations: Aim for 30 minutes of exercise or brisk walking, 6-8 glasses of water, and 5 servings of fruits and vegetables each day. Continue working on losing weight and staying active   This is a list of the screening recommended for you and due dates:  Health Maintenance  Topic Date Due   Yearly kidney health urinalysis for diabetes  Never done   Colon Cancer Screening  Never done   COVID-19 Vaccine (3 - Pfizer risk series) 07/04/2019   Hemoglobin A1C  11/24/2022   Pneumonia Vaccine (2 of 2 - PCV) 06/13/2023*   Zoster (Shingles) Vaccine (1 of 2) 11/13/2023*   Complete foot exam   05/25/2023   Yearly kidney function blood test for diabetes  08/07/2023   Eye exam for diabetics  11/03/2023   Medicare Annual Wellness Visit  12/04/2023   DTaP/Tdap/Td vaccine (2 - Td or Tdap) 08/31/2029   Flu Shot  Completed   Hepatitis C Screening  Completed   HPV Vaccine  Aged Out  *Topic was postponed. The date shown is not the original due date.    Advanced directives: (Declined) Advance directive discussed with you today. Even though you declined this today, please call our office should you change your mind, and we can give you the proper paperwork for you to fill out.  Next Medicare Annual Wellness Visit scheduled for next year: Yes

## 2022-12-04 NOTE — Progress Notes (Signed)
Subjective:   [name] [name] is a 67 y.o. male who presents for Medicare Annual/Subsequent preventive examination.  Visit Complete: Virtual I connected with  [name] on 12/04/22 by a audio enabled telemedicine application and verified that I am speaking with the correct person using two identifiers.  Patient Location: Home  Provider Location: Office/Clinic  I discussed the limitations of evaluation and management by telemedicine. The patient expressed understanding and agreed to proceed.  Vital Signs: Because this visit was a virtual/telehealth visit, some criteria may be missing or patient reported. Any vitals not documented were not able to be obtained and vitals that have been documented are patient reported.         Objective:    Today's Vitals   12/04/22 1005  Weight: 276 lb (125.2 kg)   Body mass index is 38.49 kg/m.     12/04/2022   10:11 AM 04/25/2022    8:14 AM 04/17/2022   12:16 AM 04/12/2022    1:25 PM 11/28/2021   10:05 AM 11/15/2020    9:43 AM 09/02/2020    2:35 AM  Advanced Directives  Does Patient Have a Medical Advance Directive? No No No No No No No  Would patient like information on creating a medical advance directive? No - Patient declined   No - Guardian declined No - Patient declined No - Patient declined     Current Medications (verified) Outpatient Encounter Medications as of 12/04/2022  Medication Sig   Accu-Chek Softclix Lancets lancets TEST MORNING, NOON AND BEDTIME   apixaban (ELIQUIS) 5 MG TABS tablet Take 1 tablet (5 mg total) by mouth 2 (two) times daily.   aspirin EC 81 MG tablet Take 81 mg by mouth daily.   B-D ULTRAFINE III SHORT PEN 31G X 8 MM MISC USE AS DIRECTED   Blood Glucose Monitoring Suppl DEVI 1 each by Does not apply route in the morning, at noon, and at bedtime. May substitute to any manufacturer covered by patient's insurance.Dx E11.69   diclofenac Sodium (VOLTAREN) 1 % GEL APPLY 2 GRAMS TOPICALLY 4 TIMES DAILY TO  KNEE   famotidine (PEPCID) 20 MG tablet Take 1 tablet (20 mg total) by mouth daily as needed for heartburn.   fluticasone (FLONASE) 50 MCG/ACT nasal spray Place 1 spray into both nostrils daily.   Fluticasone-Umeclidin-Vilant (TRELEGY ELLIPTA) 100-62.5-25 MCG/ACT AEPB INHALE 1 PUFF INTO THE LUNGS DAILY   HUMALOG 100 UNIT/ML injection Inject into the skin See admin instructions. 20 unts   hydroxypropyl methylcellulose / hypromellose (ISOPTO TEARS / GONIOVISC) 2.5 % ophthalmic solution Place 2 drops into both eyes as needed for dry eyes.   LANTUS SOLOSTAR 100 UNIT/ML Solostar Pen Inject 20 Units into the skin every morning. (Patient taking differently: Inject 22 Units into the skin every morning.)   lidocaine (LIDODERM) 5 % Place 1 patch onto the skin daily. Remove & Discard patch within 12 hours or as directed by MD   LINZESS 290 MCG CAPS capsule TAKE 1 CAPSULE(290 MCG) BY MOUTH DAILY BEFORE BREAKFAST   losartan (COZAAR) 50 MG tablet Take 50 mg by mouth daily.   MAGNESIUM-OXIDE 400 (241.3 Mg) MG tablet Take 1 tablet by mouth daily.   metoprolol tartrate (LOPRESSOR) 25 MG tablet TAKE 1/2 TABLET BY MOUTH TWICE A DAY (1/2AM+1/2PM)   nitroGLYCERIN (NITROSTAT) 0.4 MG SL tablet Place 1 tablet (0.4 mg total) under the tongue every 5 (five) minutes as needed for chest pain.   polyethylene glycol (MIRALAX / GLYCOLAX) 17 g packet  Take 17 g by mouth daily.   potassium chloride SA (KLOR-CON M) 20 MEQ tablet Take 2 tablets (40 mEq total) by mouth 2 (two) times daily.   PROAIR HFA 108 (90 Base) MCG/ACT inhaler Inhale 2 puffs into the lungs every 6 (six) hours as needed for wheezing or shortness of breath.   rosuvastatin (CRESTOR) 20 MG tablet TAKE ONE TABLET BY MOUTH EVERY DAY IN THE EVENING   silver sulfADIAZINE (SSD) 1 % cream APPLY PEA SIZED AMOUNT TOPICALLY TO WOUND DAILY   spironolactone (ALDACTONE) 25 MG tablet Take 1 tablet (25 mg total) by mouth daily.   torsemide (DEMADEX) 20 MG tablet TAKE 1 TABLET(20  MG) BY MOUTH TWICE DAILY   doxycycline (VIBRA-TABS) 100 MG tablet Take 1 tablet (100 mg total) by mouth 2 (two) times daily. (Patient not taking: Reported on 12/04/2022)   No facility-administered encounter medications on file as of 12/04/2022.    Allergies (verified) Empagliflozin and Ibuprofen   History: Past Medical History:  Diagnosis Date   Acute myocardial infarction, unspecified site, episode of care unspecified 2005   Acute pancreatitis    CAD (coronary artery disease)    a. CABG in 2005 w LIMA to LAD, left radial to second circumflex marginal, saphenous vein graft to PDA, saphenous vein graft to lateral subbrach of ramus intermediate, and sequential saphenous vein graft to the medial subbranch of ramus intermediate.   Chronic diastolic CHF (congestive heart failure) (HCC)    Cirrhosis of liver without mention of alcohol    CKD (chronic kidney disease), stage II    Complications affecting other specified body systems, hypertension    Ectopic atrial rhythm    Essential hypertension    Hyperlipidemia    Hypokalemia    a. intermitttent noncompliance with potassium supplement.   Hypomagnesemia    Morbid obesity (HCC)    Neuropathy    Other and unspecified hyperlipidemia    Proteinuria    RBBB    Sleep apnea    Type II or unspecified type diabetes mellitus without mention of complication, not stated as uncontrolled    Varicose veins of both lower extremities    Past Surgical History:  Procedure Laterality Date   CORONARY ARTERY BYPASS GRAFT     2005   I & D EXTREMITY Left 09/02/2019   Procedure: IRRIGATION AND DEBRIDEMENT 4th TOE DEBRIDIMENT;  Surgeon: Park Liter, DPM;  Location: [facility] ORS;  Service: Podiatry;  Laterality: Left;   TEE WITHOUT CARDIOVERSION N/A 04/25/2022   Procedure: TRANSESOPHAGEAL ECHOCARDIOGRAM (TEE);  Surgeon: Lewayne Bunting, MD;  Location: [facility] ENDOSCOPY;  Service: Cardiovascular;  Laterality: N/A;   Family History  Problem Relation Age of Onset    Breast cancer Mother    Prostate cancer Father    Coronary artery disease Father        underwent CABG   Social History   Socioeconomic History   Marital status: Significant Other    Spouse name: Not on file   Number of children: Not on file   Years of education: Not on file   Highest education level: Not on file  Occupational History   Occupation: Disabled     Comment: previously worked in delivery   Tobacco Use   Smoking status: Former    Current packs/day: 0.00    Average packs/day: 1.5 packs/day for 30.0 years (45.0 ttl pk-yrs)    Types: Cigarettes    Start date: 10/21/1974    Quit date: 10/20/2004    Years since quitting: 18.1  Passive exposure: Never   Smokeless tobacco: Never  Vaping Use   Vaping status: Never Used  Substance and Sexual Activity   Alcohol use: Yes    Comment: occasional   Drug use: No   Sexual activity: Not Currently  Other Topics Concern   Not on file  Social History Narrative   Lives with significant other x 30+ years, apartment "friends smoke". Disability. Is disabled secondary to his work-related injury.       Has over 20 grandchildren    Social Determinants of Health   Financial Resource Strain: Low Risk  (12/04/2022)   Overall Financial Resource Strain (CARDIA)    Difficulty of Paying Living Expenses: Not hard at all  Food Insecurity: No Food Insecurity (12/04/2022)   Hunger Vital Sign    Worried About Running Out of Food in the Last Year: Never true    Ran Out of Food in the Last Year: Never true  Transportation Needs: No Transportation Needs (12/04/2022)   PRAPARE - Administrator, Civil Service (Medical): No    Lack of Transportation (Non-Medical): No  Physical Activity: Sufficiently Active (12/04/2022)   Exercise Vital Sign    Days of Exercise per Week: 5 days    Minutes of Exercise per Session: 30 min  Stress: No Stress Concern Present (12/04/2022)   [employer] of Occupational Health - Occupational  Stress Questionnaire    Feeling of Stress : Not at all  Social Connections: Socially Isolated (12/04/2022)   Social Connection and Isolation Panel [NHANES]    Frequency of Communication with Friends and Family: More than three times a week    Frequency of Social Gatherings with Friends and Family: More than three times a week    Attends Religious Services: Never    Database administrator or Organizations: No    Attends Engineer, structural: Never    Marital Status: Never married    Tobacco Counseling Counseling given: Not Answered   Clinical Intake:  Pre-visit preparation completed: Yes  Pain : No/denies pain     BMI - recorded: 38.49 Nutritional Status: BMI > 30  Obese Nutritional Risks: None Diabetes: Yes CBG done?: Yes (242 after meal per pt) CBG resulted in Enter/ Edit results?: No Did pt. bring in CBG monitor from home?: No  How often do you need to have someone help you when you read instructions, pamphlets, or other written materials from your doctor or pharmacy?: 1 - Never  Interpreter Needed?: No  Information entered by :: Lanier Ensign, LPN   Activities of Daily Living    04/17/2022    5:39 PM 04/17/2022    5:38 PM  In your present state of health, do you have any difficulty performing the following activities:  Hearing?  0  Vision?  0  Difficulty concentrating or making decisions?  0  Walking or climbing stairs?  0  Dressing or bathing?  0  Doing errands, shopping? 0     Patient Care Team: [name], MD as PCP - General (Family Medicine) Jake Bathe, MD as PCP - Cardiology (Cardiology) Talmage Coin, MD as Consulting Physician (Endocrinology) Helane Gunther, DPM as Consulting Physician (Podiatry) Waymon Budge, MD as Consulting Physician (Pulmonary Disease) [name], Carma Lair, NP (Inactive) as Nurse Practitioner (Vascular Surgery) [facility] as Consulting Physician Erroll Luna, [facility] (Inactive) as  Pharmacist (Pharmacist) Fleeta Emmer, RN as [facility] Care Management  Indicate any recent Medical Services you may have  received from other than [facility] providers in the past year (date may be approximate).     Assessment:   This is a routine wellness examination for [place].  Hearing/Vision screen Hearing Screening - Comments:: Pt gets hearing aid this month oct 28 th  Vision Screening - Comments:: Pt follows up with Dr Dione Booze for annual eye exams    Goals Addressed             This Visit's Progress    Patient Stated       Losing weight        Depression Screen    12/04/2022   10:11 AM 11/13/2022    9:24 AM 06/13/2022   11:34 AM 05/05/2022    9:26 AM 05/02/2022   10:46 AM 04/15/2022   11:08 AM 03/25/2022   11:03 AM  PHQ 2/9 Scores  PHQ - 2 Score 0 0 0 0 0 0 0    Fall Risk    12/04/2022   10:13 AM 09/17/2022   10:40 AM 06/13/2022   11:34 AM 05/23/2022    9:56 AM 05/05/2022    9:15 AM  Fall Risk   Falls in the past year? 0 0 0 1 0  Number falls in past yr: 0  0 1   Injury with Fall? 0  0 0   Risk for fall due to : Impaired balance/gait;Impaired mobility  No Fall Risks    Follow up Falls prevention discussed        MEDICARE RISK AT HOME: Medicare Risk at Home Any stairs in or around the home?: No If so, are there any without handrails?: No Home free of loose throw rugs in walkways, pet beds, electrical cords, etc?: Yes Adequate lighting in your home to reduce risk of falls?: Yes Life alert?: Yes Use of a cane, walker or w/c?: Yes Grab bars in the bathroom?: No Shower chair or bench in shower?: Yes Elevated toilet seat or a handicapped toilet?: No  TIMED UP AND GO:  Was the test performed?  No    Cognitive Function:        12/04/2022   10:14 AM 11/28/2021   10:07 AM 11/15/2020    9:46 AM 11/10/2019    9:53 AM  6CIT Screen  What Year? 0 points 0 points 0 points 0 points  What month? 0 points 0 points 0 points 0 points  What time? 0 points 0  points 0 points   Count back from 20 0 points 0 points 0 points 0 points  Months in reverse 0 points 0 points 0 points 0 points  Repeat phrase 0 points 0 points 0 points 2 points  Total Score 0 points 0 points 0 points     Immunizations Immunization History  Administered Date(s) Administered   Fluad Trivalent(High Dose 65+) 11/13/2022   Influenza Split 01/29/2009, 11/19/2009, 11/11/2010, 10/23/2011, 11/10/2012, 01/31/2013, 01/24/2014, 10/20/2014   Influenza, High Dose Seasonal PF 11/02/2020   Influenza,inj,Quad PF,6+ Mos 11/10/2013, 11/30/2015, 12/10/2016   Influenza-Unspecified 11/30/2015, 11/01/2020   PFIZER(Purple Top)SARS-COV-2 Vaccination 05/17/2019, 06/06/2019   Pneumococcal Polysaccharide-23 06/26/2008   Tdap 09/01/2019    TDAP status: Up to date  Flu Vaccine status: Up to date  Pneumococcal vaccine status: Due, Education has been provided regarding the importance of this vaccine. Advised may receive this vaccine at local pharmacy or Health Dept. Aware to provide a copy of the vaccination record if obtained from local pharmacy or Health Dept. Verbalized acceptance and understanding.  Covid-19 vaccine status: Information provided  on how to obtain vaccines.   Qualifies for Shingles Vaccine? Yes   Zostavax completed No   Shingrix Completed?: No.    Education has been provided regarding the importance of this vaccine. Patient has been advised to call insurance company to determine out of pocket expense if they have not yet received this vaccine. Advised may also receive vaccine at local pharmacy or Health Dept. Verbalized acceptance and understanding.  Screening Tests Health Maintenance  Topic Date Due   Diabetic kidney evaluation - Urine ACR  Never done   Colonoscopy  Never done   COVID-19 Vaccine (3 - Pfizer risk series) 07/04/2019   HEMOGLOBIN A1C  11/24/2022   Pneumonia Vaccine 53+ Years old (2 of 2 - PCV) 06/13/2023 (Originally 01/17/2019)   Zoster Vaccines- Shingrix (1  of 2) 11/13/2023 (Originally 01/16/1975)   FOOT EXAM  05/25/2023   Diabetic kidney evaluation - eGFR measurement  08/07/2023   OPHTHALMOLOGY EXAM  11/03/2023   Medicare Annual Wellness (AWV)  12/04/2023   DTaP/Tdap/Td (2 - Td or Tdap) 08/31/2029   INFLUENZA VACCINE  Completed   Hepatitis C Screening  Completed   HPV VACCINES  Aged Out    Health Maintenance  Health Maintenance Due  Topic Date Due   Diabetic kidney evaluation - Urine ACR  Never done   Colonoscopy  Never done   COVID-19 Vaccine (3 - Pfizer risk series) 07/04/2019   HEMOGLOBIN A1C  11/24/2022    Colorectal cancer screening: Referral to GI placed 12/04/22. Pt aware the office will call re: appt.   Additional Screening:  Hepatitis C Screening:  Completed 12/05/10  Vision Screening: Recommended annual ophthalmology exams for early detection of glaucoma and other disorders of the eye. Is the patient up to date with their annual eye exam?  Yes  Who is the provider or what is the name of the office in which the patient attends annual eye exams? Dr Dione Booze  If pt is not established with a provider, would they like to be referred to a provider to establish care? No .   Dental Screening: Recommended annual dental exams for proper oral hygiene  Diabetic Foot Exam: Diabetic Foot Exam: Completed 05/25/22  Community Resource Referral / Chronic Care Management: CRR required this visit?  No   CCM required this visit?  No     Plan:     I have personally reviewed and noted the following in the patient's chart:   Medical and social history Use of alcohol, tobacco or illicit drugs  Current medications and supplements including opioid prescriptions. Patient is not currently taking opioid prescriptions. Functional ability and status Nutritional status Physical activity Advanced directives List of other physicians Hospitalizations, surgeries, and ER visits in previous 12 months Vitals Screenings to include cognitive,  depression, and falls Referrals and appointments  In addition, I have reviewed and discussed with patient certain preventive protocols, quality metrics, and best practice recommendations. A written personalized care plan for preventive services as well as general preventive health recommendations were provided to patient.     Marzella Schlein, LPN   [id number]   After Visit Summary: (MyChart) Due to this being a telephonic visit, the after visit summary with patients personalized plan was offered to patient via MyChart   Nurse Notes: none

## 2022-12-15 ENCOUNTER — Ambulatory Visit (HOSPITAL_COMMUNITY)
Admission: RE | Admit: 2022-12-15 | Discharge: 2022-12-15 | Disposition: A | Discharge status: Home / Self Care | Payer: 59 | Source: Ambulatory Visit | Attending: Cardiology | Admitting: Cardiology

## 2022-12-15 ENCOUNTER — Encounter (HOSPITAL_COMMUNITY): Payer: Self-pay | Admitting: Cardiology

## 2022-12-15 ENCOUNTER — Other Ambulatory Visit (HOSPITAL_COMMUNITY): Payer: Self-pay

## 2022-12-15 VITALS — BP 140/70 | HR 88 | Wt 278.8 lb

## 2022-12-15 DIAGNOSIS — I50812 Chronic right heart failure: Secondary | ICD-10-CM

## 2022-12-15 LAB — CBC
HCT: 41.4 % (ref 39.0–52.0)
Hemoglobin: 14.2 g/dL (ref 13.0–17.0)
MCH: 31.9 pg (ref 26.0–34.0)
MCHC: 34.3 g/dL (ref 30.0–36.0)
MCV: 93 fL (ref 80.0–100.0)
Platelets: 203 10*3/uL (ref 150–400)
RBC: 4.45 MIL/uL (ref 4.22–5.81)
RDW: 13.9 % (ref 11.5–15.5)
WBC: 7.8 10*3/uL (ref 4.0–10.5)
nRBC: 0 % (ref 0.0–0.2)

## 2022-12-15 LAB — BASIC METABOLIC PANEL
Anion gap: 13 (ref 5–15)
BUN: 11 mg/dL (ref 8–23)
CO2: 24 mmol/L (ref 22–32)
Calcium: 9.4 mg/dL (ref 8.9–10.3)
Chloride: 99 mmol/L (ref 98–111)
Creatinine, Ser: 1.19 mg/dL (ref 0.61–1.24)
GFR, Estimated: >60 mL/min (ref >60)
Glucose, Bld: 270 mg/dL — ABNORMAL HIGH (ref 70–99)
Potassium: 3.6 mmol/L (ref 3.5–5.1)
Sodium: 136 mmol/L (ref 135–145)

## 2022-12-15 LAB — BRAIN NATRIURETIC PEPTIDE: B Natriuretic Peptide: 73.6 pg/mL (ref 0.0–100.0)

## 2022-12-15 MED ORDER — LOSARTAN POTASSIUM 100 MG PO TABS: 100.0000 mg | ORAL_TABLET | Freq: Every day | ORAL | 3 refills | Status: DC

## 2022-12-15 NOTE — Patient Instructions (Addendum)
Medication Changes:  INCREASE LOSARTAN TO 100MG  ONCE DAILY   Lab Work:  Labs done today, your results will be available in MyChart, we will contact you for abnormal readings.  Testing/Procedures:  [facility] [address] [place] [address] Dept: [phone] Loc: [phone]  [name] [name]  12/15/2022  You are scheduled for a Cardiac Catheterization on Friday, November 29 with Dr.  Gasper Lloyd .  1. Please arrive at the [facility] (Main Entrance A) at [facility]: [address] [place], [place] [address] at 7:00 AM (This time is 2 hour(s) before your procedure to ensure your preparation). Free valet parking service is available. You will check in at ADMITTING. The support person will be asked to wait in the waiting room.  It is OK to have someone drop you off and come back when you are ready to be discharged.    Special note: Every effort is made to have your procedure done on time. Please understand that emergencies sometimes delay scheduled procedures.  2. Diet: Do not eat solid foods after midnight.  The patient may have clear liquids until 5am upon the day of the procedure.  3. Labs: You will need to have blood drawn on TODAY  4. Medication instructions in preparation for your procedure:   Contrast Allergy: No  Take only 10 units of insulin the night before your procedure. Do not take any insulin on the day of the procedure.  DO NOT TAKE ANY DIABETIC MEDICATION THE MORNING OF PROCEDURE   DO NOT TAKE SPIRONOLACTONE, TORSEMIDE, OR INSULIN THE MORNING OF PROCEDURE   5. Plan to go home the same day, you will only stay overnight if medically necessary. 6. Bring a current list of your medications and current insurance cards. 7. You MUST have a responsible person to drive you home. 8. Someone MUST be with you the first 24 hours after you arrive home or your discharge will be  delayed. 9. Please wear clothes that are easy to get on and off and wear slip-on shoes.  Thank you for allowing [place] to care for you!   -- [place] [facility]   Follow-Up in: 2 MONTHS PLEASE CALL OUR OFFICE AROUND DECEMBER TO GET SCHEDULED FOR YOUR APPOINTMENT. PHONE NUMBER IS [phone] OPTION 2  At the [facility], you and your health needs are our priority. We have a designated team specialized in the treatment of Heart Failure. This Care Team includes your primary Heart Failure Specialized Cardiologist (physician), Advanced Practice Providers (APPs- Physician Assistants and Nurse Practitioners), and Pharmacist who all work together to provide you with the care you need, when you need it.   You may see any of the following providers on your designated Care Team at your next follow up:  Dr. Arvilla Meres Dr. Marca Ancona Dr. Dorthula Nettles Dr. Theresia Bough Tonye Becket, NP Robbie Lis, [place] [facility] [place], [place] Brynda Peon, NP [place] Lee, NP Karle Plumber, PharmD   Please be sure to bring in all your medications bottles to every appointment.   Need to Contact us:  If you have any questions or concerns before your next appointment please send [place] a message through [place] or call our office at [phone].    TO LEAVE A MESSAGE FOR THE NURSE SELECT OPTION 2, PLEASE LEAVE A MESSAGE INCLUDING: YOUR NAME DATE OF BIRTH CALL BACK NUMBER REASON FOR CALL**this is important as we  prioritize the call backs  YOU WILL RECEIVE A CALL BACK THE SAME DAY AS LONG AS YOU CALL BEFORE 4:00 PM

## 2022-12-16 DIAGNOSIS — L603 Nail dystrophy: Secondary | ICD-10-CM | POA: Diagnosis not present

## 2022-12-16 DIAGNOSIS — L84 Corns and callosities: Secondary | ICD-10-CM | POA: Diagnosis not present

## 2022-12-16 DIAGNOSIS — E1151 Type 2 diabetes mellitus with diabetic peripheral angiopathy without gangrene: Secondary | ICD-10-CM | POA: Diagnosis not present

## 2022-12-16 DIAGNOSIS — E1142 Type 2 diabetes mellitus with diabetic polyneuropathy: Secondary | ICD-10-CM | POA: Diagnosis not present

## 2022-12-16 DIAGNOSIS — I739 Peripheral vascular disease, unspecified: Secondary | ICD-10-CM | POA: Diagnosis not present

## 2022-12-23 ENCOUNTER — Encounter: Payer: 59 | Admitting: Family Medicine

## 2022-12-24 ENCOUNTER — Ambulatory Visit: Payer: Self-pay

## 2022-12-24 DIAGNOSIS — Z1211 Encounter for screening for malignant neoplasm of colon: Secondary | ICD-10-CM | POA: Diagnosis not present

## 2022-12-24 NOTE — Patient Outreach (Signed)
  Care Coordination   Follow Up Visit Note   12/24/2022 Name: [name] [name] MRN: [id number] DOB: [birth year]  [name] [name] is a 67 y.o. year old male who sees [name], MD for primary care. I spoke with  [name] by phone today.  What matters to the patients health and wellness today?   Getting heart valve checked later this month.    Goals Addressed             This Visit's Progress    Heart Failure and Diabetes Management       Patient Goals/Self Care Activities: -Patient/Caregiver will self-administer medications as prescribed as evidenced by self-report/primary caregiver report  -Patient/Caregiver will attend all scheduled provider appointments as evidenced by clinician review of documented attendance to scheduled appointments and patient/caregiver report -Patient/Caregiver will call provider office for new concerns or questions as evidenced by review of documented incoming telephone call notes and patient report  -wear comfortable, well-fitting shoes -Weigh daily and record (notify MD with 3 lb weight gain over night or 5 lb in a week) -Follow CHF Action Plan -Adhere to low sodium diet   -Patient weight 273 lbs.   Reiterated heart failure management. - Patient continues to wear compression hose. -Blood sugar highest 260 this morning.  Discussed importance of better diabetes management.  He reports he is scheduled for heart cath later this month to check his valve.  No concerns.          SDOH assessments and interventions completed:  Yes     Care Coordination Interventions:  Yes, provided   Follow up plan: Follow up call scheduled for December    Encounter Outcome:  Patient Visit Completed

## 2022-12-26 DIAGNOSIS — J45998 Other asthma: Secondary | ICD-10-CM | POA: Diagnosis not present

## 2022-12-29 ENCOUNTER — Other Ambulatory Visit: Payer: Self-pay | Admitting: Internal Medicine

## 2022-12-29 ENCOUNTER — Other Ambulatory Visit: Payer: Self-pay | Admitting: Family Medicine

## 2023-01-01 LAB — COLOGUARD: COLOGUARD: NEGATIVE

## 2023-01-02 ENCOUNTER — Ambulatory Visit: Payer: 59 | Attending: Cardiology | Admitting: Cardiology

## 2023-01-02 ENCOUNTER — Encounter: Payer: Self-pay | Admitting: Cardiology

## 2023-01-02 VITALS — BP 120/70 | HR 76 | Ht 71.0 in | Wt 282.0 lb

## 2023-01-02 DIAGNOSIS — I071 Rheumatic tricuspid insufficiency: Secondary | ICD-10-CM

## 2023-01-02 DIAGNOSIS — I10 Essential (primary) hypertension: Secondary | ICD-10-CM

## 2023-01-02 DIAGNOSIS — G4733 Obstructive sleep apnea (adult) (pediatric): Secondary | ICD-10-CM

## 2023-01-02 DIAGNOSIS — I25708 Atherosclerosis of coronary artery bypass graft(s), unspecified, with other forms of angina pectoris: Secondary | ICD-10-CM

## 2023-01-02 DIAGNOSIS — E119 Type 2 diabetes mellitus without complications: Secondary | ICD-10-CM | POA: Diagnosis not present

## 2023-01-02 MED ORDER — NITROGLYCERIN 0.4 MG SL SUBL: 0.4000 mg | SUBLINGUAL_TABLET | SUBLINGUAL | 3 refills | Status: DC | PRN

## 2023-01-02 NOTE — Progress Notes (Signed)
Great news! Cologuard is negative. We can repeat in 3 years.  [name] [name]. [name], MD 01/02/2023 12:44 PM

## 2023-01-02 NOTE — Progress Notes (Signed)
Cardiology Office Note:  .   Date:  01/02/2023  ID:  [name] [name], DOB 04/18/1955, MRN [id number] PCP: [name], MD  [facility] Providers Cardiologist:  Donato Schultz, MD     History of Present Illness: .   [name] [name] is a 67 y.o. male Discussed the use of AI scribe software for clinical note transcription with the patient, who gave verbal consent to proceed.  History of Present Illness   A 67 year old patient with a history of coronary artery disease, ectopic atrial tachycardia, hypertension, hyperlipidemia, obstructive sleep apnea, and a history of pulmonary embolism, presented for follow-up. The patient had an echocardiogram revealing severe tricuspid regurgitation, severe right ventricular enlargement, and normal left ventricular ejection fraction.  In March 2024, the patient was admitted with acute hypoxic respiratory failure in the setting of RV failure, strep bacteremia, and new onset severe tricuspid regurgitation. A VQ scan suggested a PE. The patient reported shortness of breath with moderate exertion, consistent with NYHA3 functional class. The patient had difficulty obtaining a CPAP in the past.  The patient's hypertension was previously uncontrolled, and his losartan was increased to 100 mg/day. He was also taking torsemide 20 mg BID. For ectopic atrial tachycardia, the patient was on metoprolol tartrate 12.5 mg BID. His LDL was 53 in February 2024 on Crestor 20 mg/day. The patient was on Eliquis for treatment of his pulmonary embolism and was also on spironolactone 12.5 mg/day.  The patient reported improved breathing and was able to walk from the parking lot to the basketball center without becoming short of breath. The patient's blood pressure was 140, and he was taking losartan 50 mg/day. The patient also reported swelling in his left leg, which had been treated twice by a doctor. The patient was taking torsemide 20 mg BID for fluid management. The patient's  last kidney function creatinine was 1.19, and his potassium was 3.6.           Studies Reviewed: .        Results LABS Cr: 1.19 K: 3.6 LDL: 53 (03/2022)  RADIOLOGY VQ scan: Right upper lobe perfusion defects suggestive of pulmonary embolism (04/2022)  DIAGNOSTIC Echocardiogram: Severe tricuspid regurgitation, severe right ventricular enlargement, pulmonary pressures estimated at 44.5 mmHg, normal ejection fraction of the left ventricle (10/07/2022) TEE: Negative for valvular endocarditis (04/2022) EKG: Sinus rhythm, first-degree AV block, left axis deviation, right bundle branch block (01/02/2023)  Risk Assessment/Calculations:            Physical Exam:   VS:  BP 120/70 (BP Location: Left Arm, Patient Position: Sitting, Cuff Size: Normal)   Pulse 76   Ht 5\' 11"  (1.803 m)   Wt 282 lb (127.9 kg)   BMI 39.33 kg/m    Wt Readings from Last 3 Encounters:  01/02/23 282 lb (127.9 kg)  12/15/22 278 lb 12.8 oz (126.5 kg)  12/04/22 276 lb (125.2 kg)    GEN: Well nourished, well developed in no acute distress NECK: No JVD; No carotid bruits CARDIAC: RRR, no murmurs, no rubs, no gallops RESPIRATORY:  Clear to auscultation without rales, wheezing or rhonchi  ABDOMEN: Soft, non-tender, non-distended EXTREMITIES:  No edema; No deformity   ASSESSMENT AND PLAN: .    Assessment and Plan    Severe Tricuspid Regurgitation Severe tricuspid regurgitation with severe right ventricular enlargement and pulmonary pressures at 44.5 mmHg. Normal left ventricular ejection fraction. NYHA Class III functional status with exertional dyspnea. Awaiting right heart catheterization to assess pressures and guide  management. Procedure discussed,  to measure heart pressures. - Proceed with right heart catheterization on 01/09/2023 - Continue follow-up with structural heart clinic  Ectopic Atrial Tachycardia Ectopic atrial tachycardia managed with metoprolol tartrate 12.5 mg twice daily. EKG shows  sinus rhythm, first degree AV block, left axis deviation, and right bundle branch block, all unchanged. - Continue metoprolol tartrate 12.5 mg twice daily  Coronary Artery Disease (CAD) with CABG Coronary artery disease with coronary artery bypass grafting (CABG). No new symptoms. - Continue current management and follow-up as needed  Hypertension Hypertension managed with losartan 100 mg daily. Blood pressure 140 mmHg post-exertion, currently well-controlled. Emphasized maintaining current losartan dosage. - Continue losartan 100 mg daily - Monitor blood pressure regularly  Hyperlipidemia Hyperlipidemia managed with Crestor 20 mg daily. LDL was 53 in February 2024. - Continue Crestor 20 mg daily  Pulmonary Embolism (PE) Pulmonary embolism on chronic anticoagulation with Eliquis. - Continue Eliquis as prescribed  Chronic Kidney Disease (CKD) Acute kidney injury with creatinine previously 3.1, now 1.19. Potassium 3.6, indicating stable kidney function. - Monitor kidney function regularly  Obstructive Sleep Apnea Obstructive sleep apnea with previous difficulty obtaining CPAP. No new updates. - Reassess need for CPAP and facilitate acquisition if necessary  General Health Maintenance Advised to maintain physical activity and monitor health parameters. - Encourage regular physical activity - Monitor health parameters including blood pressure and kidney function               Signed, Donato Schultz, MD

## 2023-01-02 NOTE — H&P (View-Only) (Signed)
 Cardiology Office Note:  .   Date:  01/02/2023  ID:  [name], DOB 04/18/1955, MRN [id number] PCP: [name], MD  [facility] Providers Cardiologist:  Donato Schultz, MD     History of Present Illness: .   [name] [name] is a 67 y.o. male Discussed the use of AI scribe software for clinical note transcription with the patient, who gave verbal consent to proceed.  History of Present Illness   A 67 year old patient with a history of coronary artery disease, ectopic atrial tachycardia, hypertension, hyperlipidemia, obstructive sleep apnea, and a history of pulmonary embolism, presented for follow-up. The patient had an echocardiogram revealing severe tricuspid regurgitation, severe right ventricular enlargement, and normal left ventricular ejection fraction.  In March 2024, the patient was admitted with acute hypoxic respiratory failure in the setting of RV failure, strep bacteremia, and new onset severe tricuspid regurgitation. A VQ scan suggested a PE. The patient reported shortness of breath with moderate exertion, consistent with NYHA3 functional class. The patient had difficulty obtaining a CPAP in the past.  The patient's hypertension was previously uncontrolled, and his losartan was increased to 100 mg/day. He was also taking torsemide 20 mg BID. For ectopic atrial tachycardia, the patient was on metoprolol tartrate 12.5 mg BID. His LDL was 53 in February 2024 on Crestor 20 mg/day. The patient was on Eliquis for treatment of his pulmonary embolism and was also on spironolactone 12.5 mg/day.  The patient reported improved breathing and was able to walk from the parking lot to the basketball center without becoming short of breath. The patient's blood pressure was 140, and he was taking losartan 50 mg/day. The patient also reported swelling in his left leg, which had been treated twice by a doctor. The patient was taking torsemide 20 mg BID for fluid management. The patient's  last kidney function creatinine was 1.19, and his potassium was 3.6.           Studies Reviewed: .        Results LABS Cr: 1.19 K: 3.6 LDL: 53 (03/2022)  RADIOLOGY VQ scan: Right upper lobe perfusion defects suggestive of pulmonary embolism (04/2022)  DIAGNOSTIC Echocardiogram: Severe tricuspid regurgitation, severe right ventricular enlargement, pulmonary pressures estimated at 44.5 mmHg, normal ejection fraction of the left ventricle (10/07/2022) TEE: Negative for valvular endocarditis (04/2022) EKG: Sinus rhythm, first-degree AV block, left axis deviation, right bundle branch block (01/02/2023)  Risk Assessment/Calculations:            Physical Exam:   VS:  BP 120/70 (BP Location: Left Arm, Patient Position: Sitting, Cuff Size: Normal)   Pulse 76   Ht 5\' 11"  (1.803 m)   Wt 282 lb (127.9 kg)   BMI 39.33 kg/m    Wt Readings from Last 3 Encounters:  01/02/23 282 lb (127.9 kg)  12/15/22 278 lb 12.8 oz (126.5 kg)  12/04/22 276 lb (125.2 kg)    GEN: Well nourished, well developed in no acute distress NECK: No JVD; No carotid bruits CARDIAC: RRR, no murmurs, no rubs, no gallops RESPIRATORY:  Clear to auscultation without rales, wheezing or rhonchi  ABDOMEN: Soft, non-tender, non-distended EXTREMITIES:  No edema; No deformity   ASSESSMENT AND PLAN: .    Assessment and Plan    Severe Tricuspid Regurgitation Severe tricuspid regurgitation with severe right ventricular enlargement and pulmonary pressures at 44.5 mmHg. Normal left ventricular ejection fraction. NYHA Class III functional status with exertional dyspnea. Awaiting right heart catheterization to assess pressures and guide  management. Procedure discussed,  to measure heart pressures. - Proceed with right heart catheterization on 01/09/2023 - Continue follow-up with structural heart clinic  Ectopic Atrial Tachycardia Ectopic atrial tachycardia managed with metoprolol tartrate 12.5 mg twice daily. EKG shows  sinus rhythm, first degree AV block, left axis deviation, and right bundle branch block, all unchanged. - Continue metoprolol tartrate 12.5 mg twice daily  Coronary Artery Disease (CAD) with CABG Coronary artery disease with coronary artery bypass grafting (CABG). No new symptoms. - Continue current management and follow-up as needed  Hypertension Hypertension managed with losartan 100 mg daily. Blood pressure 140 mmHg post-exertion, currently well-controlled. Emphasized maintaining current losartan dosage. - Continue losartan 100 mg daily - Monitor blood pressure regularly  Hyperlipidemia Hyperlipidemia managed with Crestor 20 mg daily. LDL was 53 in February 2024. - Continue Crestor 20 mg daily  Pulmonary Embolism (PE) Pulmonary embolism on chronic anticoagulation with Eliquis. - Continue Eliquis as prescribed  Chronic Kidney Disease (CKD) Acute kidney injury with creatinine previously 3.1, now 1.19. Potassium 3.6, indicating stable kidney function. - Monitor kidney function regularly  Obstructive Sleep Apnea Obstructive sleep apnea with previous difficulty obtaining CPAP. No new updates. - Reassess need for CPAP and facilitate acquisition if necessary  General Health Maintenance Advised to maintain physical activity and monitor health parameters. - Encourage regular physical activity - Monitor health parameters including blood pressure and kidney function               Signed, Donato Schultz, MD

## 2023-01-02 NOTE — Patient Instructions (Signed)

## 2023-01-03 ENCOUNTER — Other Ambulatory Visit: Payer: Self-pay | Admitting: Family Medicine

## 2023-01-03 ENCOUNTER — Other Ambulatory Visit: Payer: Self-pay | Admitting: Internal Medicine

## 2023-01-05 MED ORDER — SILVER SULFADIAZINE 1 % EX CREA: TOPICAL_CREAM | CUTANEOUS | 0 refills | Status: DC

## 2023-01-09 ENCOUNTER — Encounter (HOSPITAL_COMMUNITY)
Admission: RE | Disposition: A | Discharge status: Home / Self Care | Payer: Self-pay | Source: Ambulatory Visit | Attending: Cardiology

## 2023-01-09 ENCOUNTER — Ambulatory Visit (HOSPITAL_COMMUNITY)
Admission: RE | Admit: 2023-01-09 | Discharge: 2023-01-09 | Disposition: A | Discharge status: Home / Self Care | Payer: 59 | Source: Ambulatory Visit | Attending: Cardiology | Admitting: Cardiology

## 2023-01-09 ENCOUNTER — Other Ambulatory Visit: Payer: Self-pay

## 2023-01-09 DIAGNOSIS — I2699 Other pulmonary embolism without acute cor pulmonale: Secondary | ICD-10-CM | POA: Diagnosis not present

## 2023-01-09 DIAGNOSIS — N189 Chronic kidney disease, unspecified: Secondary | ICD-10-CM | POA: Diagnosis not present

## 2023-01-09 DIAGNOSIS — I25810 Atherosclerosis of coronary artery bypass graft(s) without angina pectoris: Secondary | ICD-10-CM | POA: Insufficient documentation

## 2023-01-09 DIAGNOSIS — I509 Heart failure, unspecified: Secondary | ICD-10-CM | POA: Insufficient documentation

## 2023-01-09 DIAGNOSIS — I4719 Other supraventricular tachycardia: Secondary | ICD-10-CM | POA: Insufficient documentation

## 2023-01-09 DIAGNOSIS — Z79899 Other long term (current) drug therapy: Secondary | ICD-10-CM | POA: Diagnosis not present

## 2023-01-09 DIAGNOSIS — I071 Rheumatic tricuspid insufficiency: Secondary | ICD-10-CM | POA: Insufficient documentation

## 2023-01-09 DIAGNOSIS — I50812 Chronic right heart failure: Secondary | ICD-10-CM

## 2023-01-09 DIAGNOSIS — G4733 Obstructive sleep apnea (adult) (pediatric): Secondary | ICD-10-CM | POA: Insufficient documentation

## 2023-01-09 DIAGNOSIS — I130 Hypertensive heart and chronic kidney disease with heart failure and stage 1 through stage 4 chronic kidney disease, or unspecified chronic kidney disease: Secondary | ICD-10-CM | POA: Insufficient documentation

## 2023-01-09 DIAGNOSIS — E785 Hyperlipidemia, unspecified: Secondary | ICD-10-CM | POA: Insufficient documentation

## 2023-01-09 HISTORY — PX: RIGHT HEART CATH: CATH118263

## 2023-01-09 LAB — POCT I-STAT EG7
Acid-Base Excess: 3 mmol/L — ABNORMAL HIGH (ref 0.0–2.0)
Acid-Base Excess: 3 mmol/L — ABNORMAL HIGH (ref 0.0–2.0)
Acid-Base Excess: 3 mmol/L — ABNORMAL HIGH (ref 0.0–2.0)
Bicarbonate: 27.3 mmol/L (ref 20.0–28.0)
Bicarbonate: 27.4 mmol/L (ref 20.0–28.0)
Bicarbonate: 27.5 mmol/L (ref 20.0–28.0)
Calcium, Ion: 1.16 mmol/L (ref 1.15–1.40)
Calcium, Ion: 1.18 mmol/L (ref 1.15–1.40)
Calcium, Ion: 1.19 mmol/L (ref 1.15–1.40)
HCT: 41 % (ref 39.0–52.0)
HCT: 41 % (ref 39.0–52.0)
HCT: 41 % (ref 39.0–52.0)
Hemoglobin: 13.9 g/dL (ref 13.0–17.0)
Hemoglobin: 13.9 g/dL (ref 13.0–17.0)
Hemoglobin: 13.9 g/dL (ref 13.0–17.0)
O2 Saturation: 73 %
O2 Saturation: 74 %
O2 Saturation: 77 %
Potassium: 2.9 mmol/L — ABNORMAL LOW (ref 3.5–5.1)
Potassium: 2.9 mmol/L — ABNORMAL LOW (ref 3.5–5.1)
Potassium: 3 mmol/L — ABNORMAL LOW (ref 3.5–5.1)
Sodium: 139 mmol/L (ref 135–145)
Sodium: 139 mmol/L (ref 135–145)
Sodium: 139 mmol/L (ref 135–145)
TCO2: 28 mmol/L (ref 22–32)
TCO2: 29 mmol/L (ref 22–32)
TCO2: 29 mmol/L (ref 22–32)
pCO2, Ven: 38.5 mm[Hg] — ABNORMAL LOW (ref 44–60)
pCO2, Ven: 38.9 mm[Hg] — ABNORMAL LOW (ref 44–60)
pCO2, Ven: 39 mm[Hg] — ABNORMAL LOW (ref 44–60)
pH, Ven: 7.456 — ABNORMAL HIGH (ref 7.25–7.43)
pH, Ven: 7.456 — ABNORMAL HIGH (ref 7.25–7.43)
pH, Ven: 7.459 — ABNORMAL HIGH (ref 7.25–7.43)
pO2, Ven: 36 mm[Hg] (ref 32–45)
pO2, Ven: 37 mm[Hg] (ref 32–45)
pO2, Ven: 39 mm[Hg] (ref 32–45)

## 2023-01-09 LAB — GLUCOSE, CAPILLARY
Glucose-Capillary: 253 mg/dL — ABNORMAL HIGH (ref 70–99)
Glucose-Capillary: 287 mg/dL — ABNORMAL HIGH (ref 70–99)

## 2023-01-09 SURGERY — RIGHT HEART CATH
Anesthesia: LOCAL

## 2023-01-09 MED ORDER — SODIUM CHLORIDE 0.9 % IV SOLN
INTRAVENOUS | Status: DC
Start: 2023-01-09 — End: 2023-01-09

## 2023-01-09 MED ORDER — LIDOCAINE HCL (PF) 1 % IJ SOLN
INTRAMUSCULAR | Status: DC | PRN
  Administered 2023-01-09: 2 mL

## 2023-01-09 MED ORDER — HEPARIN (PORCINE) IN NACL 1000-0.9 UT/500ML-% IV SOLN
INTRAVENOUS | Status: DC | PRN
  Administered 2023-01-09: 500 mL

## 2023-01-09 MED ORDER — LIDOCAINE HCL (PF) 1 % IJ SOLN
INTRAMUSCULAR | Status: AC
  Filled 2023-01-09: qty 30

## 2023-01-09 SURGICAL SUPPLY — 5 items
CATH SWAN GANZ 7F STRAIGHT (CATHETERS) IMPLANT
GLIDESHEATH SLENDER 7FR .021G (SHEATH) IMPLANT
PACK CARDIAC CATHETERIZATION (CUSTOM PROCEDURE TRAY) ×1 IMPLANT
TRANSDUCER W/STOPCOCK (MISCELLANEOUS) IMPLANT
TUBING ART PRESS 72 MALE/FEM (TUBING) IMPLANT

## 2023-01-09 NOTE — Interval H&P Note (Signed)
History and Physical Interval Note:  01/09/2023 8:02 AM  [name] [name]  has presented today for surgery, with the diagnosis of Heart Failure.  The various methods of treatment have been discussed with the patient and family. After consideration of risks, benefits and other options for treatment, the patient has consented to  Procedure(s): RIGHT HEART CATH (N/A) as a surgical intervention.  The patient's history has been reviewed, patient examined, no change in status, stable for surgery.  I have reviewed the patient's chart and labs.  Questions were answered to the patient's satisfaction.     [name]

## 2023-01-09 NOTE — Discharge Instructions (Signed)

## 2023-01-12 ENCOUNTER — Encounter (HOSPITAL_COMMUNITY): Payer: Self-pay | Admitting: Cardiology

## 2023-01-13 ENCOUNTER — Telehealth (HOSPITAL_COMMUNITY): Payer: Self-pay

## 2023-01-13 NOTE — Telephone Encounter (Signed)
error 

## 2023-01-14 ENCOUNTER — Ambulatory Visit: Payer: 59 | Admitting: Family Medicine

## 2023-01-16 ENCOUNTER — Ambulatory Visit (INDEPENDENT_AMBULATORY_CARE_PROVIDER_SITE_OTHER): Payer: 59 | Admitting: Family Medicine

## 2023-01-16 ENCOUNTER — Encounter: Payer: Self-pay | Admitting: Family Medicine

## 2023-01-16 VITALS — BP 102/68 | HR 80 | Temp 97.5°F | Ht 71.0 in | Wt 283.6 lb

## 2023-01-16 DIAGNOSIS — Z794 Long term (current) use of insulin: Secondary | ICD-10-CM

## 2023-01-16 DIAGNOSIS — M1990 Unspecified osteoarthritis, unspecified site: Secondary | ICD-10-CM | POA: Diagnosis not present

## 2023-01-16 DIAGNOSIS — M79605 Pain in left leg: Secondary | ICD-10-CM

## 2023-01-16 DIAGNOSIS — J329 Chronic sinusitis, unspecified: Secondary | ICD-10-CM | POA: Diagnosis not present

## 2023-01-16 DIAGNOSIS — I10 Essential (primary) hypertension: Secondary | ICD-10-CM | POA: Diagnosis not present

## 2023-01-16 DIAGNOSIS — E1169 Type 2 diabetes mellitus with other specified complication: Secondary | ICD-10-CM

## 2023-01-16 MED ORDER — DOXYCYCLINE HYCLATE 100 MG PO TABS: 100.0000 mg | ORAL_TABLET | Freq: Two times a day (BID) | ORAL | 0 refills | Status: DC

## 2023-01-16 MED ORDER — FLUTICASONE PROPIONATE 50 MCG/ACT NA SUSP: 1.0000 | Freq: Every day | NASAL | 12 refills | Status: AC

## 2023-01-16 MED ORDER — ONETOUCH ULTRA TEST VI STRP: ORAL_STRIP | 0 refills | Status: DC

## 2023-01-16 NOTE — Patient Instructions (Signed)
It was very nice to see you today!  You have a sinus infection.  Please start the doxycycline.  Let us know if not improving  Please schedule appoint with Dr. Jean Rosenthal for your leg soon.  Return if symptoms worsen or fail to improve.   Take care, Dr [name]  PLEASE NOTE:  If you had any lab tests, please let us know if you have not heard back within a few days. You may see your results on mychart before we have a chance to review them but we will give you a call once they are reviewed by [place].   If we ordered any referrals today, please let us know if you have not heard from their office within the next week.   If you had any urgent prescriptions sent in today, please check with the pharmacy within an hour of our visit to make sure the prescription was transmitted appropriately.   Please try these tips to maintain a healthy lifestyle:  Eat at least 3 REAL meals and 1-2 snacks per day.  Aim for no more than 5 hours between eating.  If you eat breakfast, please do so within one hour of getting up.   Each meal should contain half fruits/vegetables, one quarter protein, and one quarter carbs (no bigger than a computer mouse)  Cut down on sweet beverages. This includes juice, soda, and sweet tea.   Drink at least 1 glass of water with each meal and aim for at least 8 glasses per day  Exercise at least 150 minutes every week.

## 2023-01-16 NOTE — Progress Notes (Signed)
   [name] [name] is a 67 y.o. male who presents today for an office visit.  Assessment/Plan:  New/Acute Problems: Left Leg Pain He does have some underlying osteoarthritis although may have some underlying meniscal damage as well given his mechanical symptoms.  He has already seen sports medicine and orthopedics for this.  Advised him to follow-up with them soon to discuss next steps in management though will likely need the pain is damaging such as MRI.  Sinusitis  No red flags.  Given length of symptoms will treat with doxycycline.  He has done well with this previously.  He can continue over-the-counter meds and his nasal sprays.  Encouraged hydration.  He will let us know if not improving.  Chronic Problems Addressed Today: HTN (hypertension) Blood pressure well-controlled on current regimen per cardiology with metoprolol tartrate 12.5 mg twice daily and spironolactone 12.5 mg daily.  Type 2 diabetes mellitus with other specified complication (HCC), followed by Dr. Sharl Ma Following with endocrinology.  Will refill his test strips today as he is running well and has not heard back from his endocrinologist.  He will be following up with them soon for ongoing management.  Reports that his home sugars have been well-controlled.  Osteoarthritis Likely contributing to his above left leg pain.  He will follow-up with sports medicine soon to discuss next Epson management as above.     Subjective:  HPI:  See A/P for status of chronic conditions.  Patient is here today for follow-up.  Last saw him about 7 months ago.  Since our last visit he has been following with cardiology and endocrinology.  He did have a right heart cath about a week ago. He has done well with this.   His main concern today is left leg pain. He has seen sports medicine for this in the past and had a steroid shot last year which helped. He unfortunately has had worsening the last several months. He did see orthopedics for  this several months. Sometimes he feels like his knee is giving out.   He has also been been having issues with nasal congestion for the last week or two. No fevers or chills. Mild cough. Some yellow phlegm production.        Objective:  Physical Exam: BP 102/68   Pulse 80   Temp (!) 97.5 F (36.4 C) (Temporal)   Ht 5\' 11"  (1.803 m)   Wt 283 lb 9.6 oz (128.6 kg)   SpO2 96%   BMI 39.55 kg/m   Gen: No acute distress, resting comfortably HEENT: OP erythematous.  Nasal mucosa erythematous and boggy bilaterally with thick discharge. CV: Regular rate and rhythm with no murmurs appreciated Pulm: Normal work of breathing, clear to auscultation bilaterally with no crackles, wheezes, or rhonchi Neuro: Grossly normal, moves all extremities Psych: Normal affect and thought content      Alvera Tourigny M. [name], MD 01/16/2023 9:59 AM

## 2023-01-16 NOTE — Assessment & Plan Note (Signed)
Likely contributing to his above left leg pain.  He will follow-up with sports medicine soon to discuss next Epson management as above.

## 2023-01-16 NOTE — Assessment & Plan Note (Signed)
Following with endocrinology.  Will refill his test strips today as he is running well and has not heard back from his endocrinologist.  He will be following up with them soon for ongoing management.  Reports that his home sugars have been well-controlled.

## 2023-01-16 NOTE — Assessment & Plan Note (Signed)
Blood pressure well-controlled on current regimen per cardiology with metoprolol tartrate 12.5 mg twice daily and spironolactone 12.5 mg daily.

## 2023-01-21 ENCOUNTER — Ambulatory Visit: Payer: Self-pay

## 2023-01-21 NOTE — Patient Instructions (Signed)
Visit Information  Thank you for taking time to visit with me today. Please don't hesitate to contact me if I can be of assistance to you.   Following are the goals we discussed today:   Goals Addressed             This Visit's Progress    Heart Failure and Diabetes Management       Patient Goals/Self Care Activities: -Patient/Caregiver will self-administer medications as prescribed as evidenced by self-report/primary caregiver report  -Patient/Caregiver will attend all scheduled provider appointments as evidenced by clinician review of documented attendance to scheduled appointments and patient/caregiver report -Patient/Caregiver will call provider office for new concerns or questions as evidenced by review of documented incoming telephone call notes and patient report  -wear comfortable, well-fitting shoes -Weigh daily and record (notify MD with 3 lb weight gain over night or 5 lb in a week) -Follow CHF Action Plan -Adhere to low sodium diet   -Patient weight 276 lbs.  He reports his weight is coming back down as he had not done so well but now is back on track.   Encouraged heart failure management. - Patient continues to wear compression hose. -Blood sugar 109-180  Reiterated diabetes management.  Heart cath complete to monitor regurgitation.  No  RN CM concerns.          Our next appointment is by telephone on 02/18/23 at 1030 am  Please call the care guide team at [phone] if you need to cancel or reschedule your appointment.   If you are experiencing a Mental Health or Behavioral Health Crisis or need someone to talk to, please call the Suicide and Crisis Lifeline: 988   Patient verbalizes understanding of instructions and care plan provided today and agrees to view in MyChart. Active MyChart status and patient understanding of how to access instructions and care plan via MyChart confirmed with patient.     The patient has been provided with contact information for the  care management team and has been advised to call with any health related questions or concerns.   Bary Leriche, RN, MSN RN Care Manager [facility], Population Health Direct Dial: [phone]  Fax: [phone] Website: [email or web address]

## 2023-01-21 NOTE — Patient Outreach (Signed)
  Care Coordination   Follow Up Visit Note   01/21/2023 Name: [name] [name] MRN: [id number] DOB: 1955-09-10  [name] [name] is a 67 y.o. year old male who sees [name], MD for primary care. I spoke with  [name] [name] by phone today.  What matters to the patients health and wellness today?  Managing health    Goals Addressed             This Visit's Progress    Heart Failure and Diabetes Management       Patient Goals/Self Care Activities: -Patient/Caregiver will self-administer medications as prescribed as evidenced by self-report/primary caregiver report  -Patient/Caregiver will attend all scheduled provider appointments as evidenced by clinician review of documented attendance to scheduled appointments and patient/caregiver report -Patient/Caregiver will call provider office for new concerns or questions as evidenced by review of documented incoming telephone call notes and patient report  -wear comfortable, well-fitting shoes -Weigh daily and record (notify MD with 3 lb weight gain over night or 5 lb in a week) -Follow CHF Action Plan -Adhere to low sodium diet   -Patient weight 276 lbs.  He reports his weight is coming back down as he had not done so well but now is back on track.   Encouraged heart failure management. - Patient continues to wear compression hose. -Blood sugar 109-180  Reiterated diabetes management.  Heart cath complete to monitor regurgitation.  No  RN CM concerns.          SDOH assessments and interventions completed:  Yes     Care Coordination Interventions:  Yes, provided   Follow up plan: Follow up call scheduled for January    Encounter Outcome:  Patient Visit Completed   Bary Leriche, RN, MSN RN Care Manager [facility], Population Health Direct Dial: [phone]  Fax: [phone] Website: [email or web address]

## 2023-01-22 NOTE — Progress Notes (Signed)
[name] [address] [place] [address] Phone: [phone]   Assessment and Plan:     1. Primary osteoarthritis of left knee 2. Chronic pain of left knee  -Chronic with exacerbation, subsequent visit - Consistent with flare of severe osteoarthritis of left knee - Patient received 8 to 9 months relief from CSI on 01/10/2022.  Elects for repeat CSI.  Tolerated well per note below.  Increased risk of bleeding with patient on chronic anticoagulation with Eliquis - May use Tylenol for day-to-day pain relief  Procedure: Knee Joint Injection Side: Left  Indication: Flare of severe osteoarthritis  Risks explained and consent was given verbally. The site was cleaned with alcohol prep. A needle was introduced with an anterio-lateral approach. Injection given using 2mL of 1% lidocaine without epinephrine and 1mL of kenalog 40mg /ml. This was well tolerated and resulted in symptomatic relief.  Needle was removed, hemostasis achieved, and post injection instructions were explained.   Pt was advised to call or return to clinic if these symptoms worsen or fail to improve as anticipated.    Pertinent previous records reviewed include none  Follow Up: As needed if no improvement or worsening of symptoms.  Consider alternate injections versus orthopedic surgery referral   Subjective:   I, [name] [name], am serving as a Neurosurgeon for Doctor Richardean Sale  Chief Complaint: left knee pain   HPI:  01/23/2023 Patient is a 67 year old male with left knee pain. Patient states that he has been in pain for some time.no new MOI. Pain radiates down to his calf. Tylenol for the pain doesn't help. States he is going to get diabetic shoes   Was see 11/13/2022 worley  Complains of experiencing left posterior leg edema that is painful to walk with for the past month.  Reports he went to the orthopedist last month and they found nothing of concern -- I  do not have these records. He had an ultrasound done 9/4 for possible DVT which found no abnormalities.  States pain resolved temporarily sometime after, but then re-occurred.  Notes compliance with 5 mg Eliquis (for prior pulmonary embolism) and 20 mg Torsemide twice daily regularly.    He does wear compression stockings; reports he hasn't been elevating his leg when he's sedentary. Notes that swelling improves during the night while he is asleep, even if he sleeps in his recliner without a foot rest.  Endorsed pain on palpation of two focal areas on left ankle, right laterality; also a history of "blockage" in his left upper thigh, but no concerns from cardiology and vascular.  Relevant Historical Information: DM type II, hypertension, elevated BMI, PVD, chronic anticoagulation on Eliquis  Additional pertinent review of systems negative.   Current Outpatient Medications:    Accu-Chek Softclix Lancets lancets, TEST MORNING, NOON AND BEDTIME, Disp: 100 each, Rfl: 3   albuterol (ACCUNEB) 0.63 MG/3ML nebulizer solution, Take 1 ampule by nebulization every 6 (six) hours as needed for wheezing., Disp: , Rfl:    albuterol (VENTOLIN HFA) 108 (90 Base) MCG/ACT inhaler, INHALE TWO PUFFS BY MOUTH EVERY SIX HOURS AS NEEDED FOR WHEEZING OR SHORTNESS OF BREATH, Disp: 8.5 g, Rfl: 1   aspirin EC 81 MG tablet, Take 81 mg by mouth daily., Disp: , Rfl:    B-D ULTRAFINE III SHORT PEN 31G X 8 MM MISC, USE AS DIRECTED, Disp: 100 each, Rfl: 5   Blood Glucose Monitoring Suppl DEVI, 1 each by Does not apply  route in the morning, at noon, and at bedtime. May substitute to any manufacturer covered by patient's insurance.Dx E11.69, Disp: 1 each, Rfl: 0   diclofenac Sodium (VOLTAREN) 1 % GEL, APPLY 2 GRAMS TOPICALLY 4 TIMES DAILY TO KNEE, Disp: 100 g, Rfl: 2   doxycycline (VIBRA-TABS) 100 MG tablet, Take 1 tablet (100 mg total) by mouth 2 (two) times daily., Disp: 20 tablet, Rfl: 0   ELIQUIS 5 MG TABS tablet, TAKE ONE  TABLET BY MOUTH TWICE DAILY (AM+PM), Disp: 60 tablet, Rfl: 1   famotidine (PEPCID) 20 MG tablet, Take 1 tablet (20 mg total) by mouth daily as needed for heartburn., Disp: 90 tablet, Rfl: 2   fluticasone (FLONASE) 50 MCG/ACT nasal spray, Place 1 spray into both nostrils daily., Disp: 16 g, Rfl: 12   Fluticasone-Umeclidin-Vilant (TRELEGY ELLIPTA) 100-62.5-25 MCG/ACT AEPB, INHALE 1 PUFF INTO THE LUNGS DAILY, Disp: 180 each, Rfl: 0   formoterol (PERFOROMIST) 20 MCG/2ML nebulizer solution, Take 20 mcg by nebulization every 12 (twelve) hours., Disp: , Rfl:    glucose blood (ONETOUCH ULTRA TEST) test strip, Use as instructed, Disp: 800 each, Rfl: 0   HUMALOG 100 UNIT/ML injection, Inject 20 Units into the skin 3 (three) times daily with meals. Sliding scale, Disp: , Rfl:    hydroxypropyl methylcellulose / hypromellose (ISOPTO TEARS / GONIOVISC) 2.5 % ophthalmic solution, Place 2 drops into both eyes as needed for dry eyes., Disp: 15 mL, Rfl: 12   LANTUS SOLOSTAR 100 UNIT/ML Solostar Pen, Inject 20 Units into the skin every morning. (Patient taking differently: Inject 22 Units into the skin every morning.), Disp: 15 mL, Rfl: 11   lidocaine (LIDODERM) 5 %, Place 1 patch onto the skin daily. Remove & Discard patch within 12 hours or as directed by MD, Disp: 5 patch, Rfl: 0   LINZESS 290 MCG CAPS capsule, TAKE 1 CAPSULE(290 MCG) BY MOUTH DAILY BEFORE BREAKFAST, Disp: 30 capsule, Rfl: 0   losartan (COZAAR) 100 MG tablet, Take 1 tablet (100 mg total) by mouth daily., Disp: 30 tablet, Rfl: 3   MAGNESIUM-OXIDE 400 (241.3 Mg) MG tablet, Take 1 tablet by mouth daily., Disp: , Rfl:    metoprolol tartrate (LOPRESSOR) 25 MG tablet, TAKE 1/2 TABLET BY MOUTH TWICE A DAY (1/2AM+1/2PM), Disp: 48 tablet, Rfl: 1   nitroGLYCERIN (NITROSTAT) 0.4 MG SL tablet, Place 1 tablet (0.4 mg total) under the tongue every 5 (five) minutes as needed for chest pain., Disp: 25 tablet, Rfl: 3   polyethylene glycol (MIRALAX / GLYCOLAX) 17 g  packet, Take 17 g by mouth daily., Disp: 14 each, Rfl: 0   potassium chloride SA (KLOR-CON M) 20 MEQ tablet, Take 2 tablets (40 mEq total) by mouth 2 (two) times daily., Disp: 120 tablet, Rfl: 11   rosuvastatin (CRESTOR) 20 MG tablet, TAKE ONE TABLET BY MOUTH EVERY DAY IN THE EVENING, Disp: 30 tablet, Rfl: 1   silver sulfADIAZINE (SSD) 1 % cream, APPLY PEA SIZED AMOUNT TOPICALLY TO WOUND DAILY, Disp: 50 g, Rfl: 0   spironolactone (ALDACTONE) 25 MG tablet, Take 1 tablet (25 mg total) by mouth daily., Disp: 30 tablet, Rfl: 11   torsemide (DEMADEX) 20 MG tablet, TAKE 1 TABLET(20 MG) BY MOUTH TWICE DAILY, Disp: 180 tablet, Rfl: 2   Objective:     Vitals:   01/23/23 0842  BP: 120/68  Pulse: 78  SpO2: 98%  Weight: 283 lb (128.4 kg)  Height: 5\' 11"  (1.803 m)      Body mass index is 39.47 kg/m.  Physical Exam:    General:  awake, alert oriented, no acute distress nontoxic Skin: no suspicious lesions or rashes Neuro:sensation intact and strength 5/5 with no deficits, no atrophy, normal muscle tone Psych: No signs of anxiety, depression or other mood disorder   Left knee: Mild swelling No deformity   Neg fluid wave, joint milking ROM Flex 90, Ext 10 TTP medial and lateral joint line NTTP over the quad tendon, medial fem condyle, lat fem condyle, patella, plica, patella tendon, tibial tuberostiy, fibular head, posterior fossa, pes anserine bursa, gerdy's tubercle,       Gait antalgic, favoring the right leg.  Using rollator for assistance with ambulation    Electronically signed by:  [name] 9:47 AM 01/23/23

## 2023-01-23 ENCOUNTER — Telehealth: Payer: Self-pay | Admitting: Family Medicine

## 2023-01-23 ENCOUNTER — Other Ambulatory Visit: Payer: Self-pay

## 2023-01-23 ENCOUNTER — Ambulatory Visit (INDEPENDENT_AMBULATORY_CARE_PROVIDER_SITE_OTHER): Payer: 59 | Admitting: Sports Medicine

## 2023-01-23 VITALS — BP 120/68 | HR 78 | Ht 71.0 in | Wt 283.0 lb

## 2023-01-23 DIAGNOSIS — M25562 Pain in left knee: Secondary | ICD-10-CM | POA: Diagnosis not present

## 2023-01-23 DIAGNOSIS — M1712 Unilateral primary osteoarthritis, left knee: Secondary | ICD-10-CM

## 2023-01-23 DIAGNOSIS — G8929 Other chronic pain: Secondary | ICD-10-CM | POA: Diagnosis not present

## 2023-01-23 MED ORDER — HUMALOG 100 UNIT/ML IJ SOLN: 20.0000 [IU] | Freq: Three times a day (TID) | INTRAMUSCULAR | 0 refills | Status: DC

## 2023-01-23 NOTE — Telephone Encounter (Signed)
Prescription Request  01/23/2023  LOV: 01/16/2023  What is the name of the medication or equipment? Fluticasone-Umeclidin-Vilant (TRELEGY ELLIPTA) 100-62.5-25 MCG/ACT AEPB   HUMALOG 100 UNIT/ML injection    PT REQUESTS 90 DAY SUPPLY OF MEDS ABOVE  Have you contacted your pharmacy to request a refill? Yes   Which pharmacy would you like this sent to?  [facility] [phone] - [place], [place] - [address] AT [facility] OF [address] [place] [address] Phone: [phone] Fax: [phone]     Patient notified that their request is being sent to the clinical staff for review and that they should receive a response within 2 business days.   Please advise at [facility] [phone]

## 2023-01-23 NOTE — Telephone Encounter (Signed)
Rx sent to pharmacy per PCP

## 2023-01-23 NOTE — Telephone Encounter (Signed)
Ok to put in emergency refill but he needs to follow up with endo.  [name]. [name], MD 01/23/2023 3:18 PM

## 2023-01-23 NOTE — Patient Instructions (Signed)
 Tylenol [id number] mg 2-3 times a day for pain relief  As needed follow up

## 2023-01-23 NOTE — Telephone Encounter (Signed)
Please advise on Humalog 100 unit/mL refill; Trelegy Ellipta previously filled by Dr Maple Hudson in August

## 2023-01-26 ENCOUNTER — Other Ambulatory Visit: Payer: Self-pay | Admitting: Cardiology

## 2023-01-26 ENCOUNTER — Telehealth: Payer: Self-pay | Admitting: Internal Medicine

## 2023-01-26 NOTE — Telephone Encounter (Signed)
Patient needs a refill of trelegy.  [facility] [address]

## 2023-01-27 ENCOUNTER — Ambulatory Visit: Payer: 59 | Admitting: Internal Medicine

## 2023-01-27 ENCOUNTER — Encounter: Payer: Self-pay | Admitting: Internal Medicine

## 2023-01-27 VITALS — BP 120/60 | HR 82 | Temp 97.9°F | Ht 71.0 in | Wt 281.6 lb

## 2023-01-27 DIAGNOSIS — J410 Simple chronic bronchitis: Secondary | ICD-10-CM | POA: Diagnosis not present

## 2023-01-27 DIAGNOSIS — G4733 Obstructive sleep apnea (adult) (pediatric): Secondary | ICD-10-CM

## 2023-01-27 MED ORDER — TRELEGY ELLIPTA 100-62.5-25 MCG/ACT IN AEPB: INHALATION_SPRAY | RESPIRATORY_TRACT | 4 refills | Status: DC

## 2023-01-27 MED ORDER — ALBUTEROL SULFATE HFA 108 (90 BASE) MCG/ACT IN AERS: INHALATION_SPRAY | RESPIRATORY_TRACT | 4 refills | Status: DC

## 2023-01-27 NOTE — Progress Notes (Signed)
HPI  male former smoker followed for OSA, chronic bronchitis, SAR, CAD/MI/CABG/ CHF, HBP, DM, cirrhosis liver, Continues oxygen 2 L for sleep/ [facility]. PFT 06/18/2011-moderate restriction. Flows are normal for measure volume with FEV1/FVC 0.84 and insignificant response to bronchodilator. DLCO 52%. HST-08/05/2016-AHI 37.5/hour, desaturation to 67%, body weight 294 pounds HST 09/08/18- AHI 32.4/ hr, desaturation to 73%- avg 89%, body weight 298 lbs -------------------------------------------------------------------------------   04/19/20- 67 year old male former smoker followed for OSA, chronic bronchitis, SAR, CAD/MI/CABG, dCHF, DM2, PVD, hepatic cirrhosis, Morbid Obesity,  HST 09/08/18- AHI 32.4/ hr, desaturation to 73%- avg 89%, body weight 298 lbs CPAP auto 5-20/ [facility]  Ordered 11/01/2018 Download- compliance 0%, AHI 2.1/ hr Body weight today- Covid vax-2 phizer                          Wife here Flu vax-no -----Has been doing ok with his cpap.  He feels that his cpap needs to be adjusted. He stated that he had to remove the mask at times due to the air flow felt so strong.  He stated that he moved and he has not used it for about 2-3 weeks. Last download available is from January. Discussed compliance and goals. Asks refill hycodan cough syrup for "occasional cough only if catches cold". Has been constipated and having heartburn. Explained his cough syrup will be constipating No specific cardiac or pulmonary events since last here. Does have chronic peripheral edema CXR 07/03/19-  IMPRESSION: 1. No active cardiopulmonary disease. 2. Chronic interstitial lung changes.  01/27/23-  67 year old male former smoker followed for OSA, chronic bronchitis, SAR, CAD/MI/CABG, dCHF, DM2, Tricuspid Regurgitation, PVD, hepatic cirrhosis, Morbid Obesity,  HST 09/08/18- AHI 32.4/ hr, desaturation to 73%- avg 89%, body weight 298 lbs CPAP auto 5-20/ [facility]  Ordered 11/01/2018   Not using    Download-  compliance  Body weight today-281 lbs He says he feels well and denies dyspnea walking. Little cough or wheeze. Needs refills. Discussed OSA management. Wife confirms he snores. He hasn't been using CPAP- complains of mask fit. Discussed Inspire- I think he is too heavy now. Agrees to update sleep study to requalify. CT chest 04/18/22 IMPRESSION: 1. No acute intrathoracic process. 2. Stable cardiomegaly, with prominent biatrial dilatation. 3.  Aortic Atherosclerosis (ICD10-I70.0).  ROS-see HPI   + = positive Constitutional:   No-   weight loss, night sweats, fevers, chills, + fatigue, lassitude. HEENT:   No-  headaches, difficulty swallowing, tooth/dental problems, sore throat,       No-  sneezing, itching, ear ache, nasal congestion, post nasal drip,  CV:  No-   chest pain, orthopnea, PND, + swelling in lower extremities, anasarca,  dizziness, palpitations Resp: + Shortness of breath with exertion or at rest.              No-   productive cough,  No non-productive cough,  No- coughing up of blood.              No-   change in color of mucus.  + Occasional wheezing.   Skin: No-   rash or lesions. GI:  No-   heartburn, indigestion, abdominal pain, nausea, vomiting,  GU: . MS:  No-   joint pain or swelling.   Neuro-     nothing unusual Psych:  No- change in mood or affect. No depression or anxiety.  No memory loss.   OBJ- Physical Exam General- Alert, Oriented, Affect-appropriate, Distress- none acute, +obese,  Skin- +  stasis changes legs, + Increased skin turgor? Edema Lymphadenopathy- none Head- atraumatic            Eyes- Gross vision intact, PERRLA, conjunctivae and secretions clear            Ears- Hearing, canals-normal            Nose- Clear, no-Septal dev, mucus, polyps, erosion, perforation             Throat- Mallampati IV , mucosa clear , drainage- none, tonsils- atrophic, +missing teeth Neck- flexible , trachea midline, no stridor , thyroid nl, carotid no bruit Chest -  symmetrical excursion , unlabored           Heart/CV- RRR , no murmur , no gallop  , no rub, nl s1 s2                           - JVD- none , edema- none, stasis changes- none, varices- none           Lung- clear to P&A, wheeze- none, cough- none , dullness-none, rub- none           Chest wall- sternotomy scar Abd-  Br/ Gen/ Rectal- Not done, not indicated Extrem- cyanosis- none, clubbing, none, atrophy- none, strength- nl. Vascular graft donor site scar left forearm. + Cane, + chronic brawny peripheral edema w stasis changes in calves. Neuro- grossly intact to observation

## 2023-01-27 NOTE — Assessment & Plan Note (Signed)
We discussed weight goals for Inspire candidacy in the future.

## 2023-01-27 NOTE — Assessment & Plan Note (Signed)
Some benefit from inhalers Plan- medication discussion. Refill albuterol and Trelegy 100.

## 2023-01-27 NOTE — Assessment & Plan Note (Signed)
He has not been adequately compliant, but agrees to try again.  Plan- update home sleep test

## 2023-01-27 NOTE — Patient Instructions (Signed)
Order- schedule home sleep test dx OSA  Refills sent for Trelegy and albuterol inhalers

## 2023-01-28 NOTE — Telephone Encounter (Signed)
Refills sent 01/27/23

## 2023-02-18 ENCOUNTER — Ambulatory Visit: Payer: Self-pay

## 2023-02-18 NOTE — Patient Instructions (Signed)
 Visit Information  Thank you for taking time to visit with me today. Please don't hesitate to contact me if I can be of assistance to you.   Following are the goals we discussed today:   Goals Addressed             This Visit's Progress    Heart Failure and Diabetes Management       Patient Goals/Self Care Activities: -Patient/Caregiver will self-administer medications as prescribed as evidenced by self-report/primary caregiver report  -Patient/Caregiver will attend all scheduled provider appointments as evidenced by clinician review of documented attendance to scheduled appointments and patient/caregiver report -Patient/Caregiver will call provider office for new concerns or questions as evidenced by review of documented incoming telephone call notes and patient report  -wear comfortable, well-fitting shoes -Weigh daily and record (notify MD with 3 lb weight gain over night or 5 lb in a week) -Follow CHF Action Plan -Adhere to low sodium diet   -Patient weight 275 lbs.  Reiterated daily weights and low salt intake.  He verbalized understanding.   Patient continues to wear compression hose. -Blood sugar last check 185.  Stressed limiting carbohydrates and sweets.  He verbalized understanding.  No  RN CM concerns.          Our next appointment is by telephone on 03/19/23 at 1100 am  Please call the care guide team at [phone] if you need to cancel or reschedule your appointment.   If you are experiencing a Mental Health or Behavioral Health Crisis or need someone to talk to, please call the Suicide and Crisis Lifeline: 988   Patient verbalizes understanding of instructions and care plan provided today and agrees to view in MyChart. Active MyChart status and patient understanding of how to access instructions and care plan via MyChart confirmed with patient.     The patient has been provided with contact information for the care management team and has been advised to call with  any health related questions or concerns.   Jessen Siegman J Tiffannie Sloss, RN, MSN RN Care Manager [facility], Population Health Direct Dial: [phone]  Fax: [phone] Website: [email or web address]

## 2023-02-18 NOTE — Patient Outreach (Signed)
  Care Coordination   Follow Up Visit Note   02/18/2023 Name: [name] [name] MRN: [id number] DOB: 27-Oct-1955  [name] [name] is a 68 y.o. year old male who sees [place] Worth HERO, MD for primary care. I spoke with  Fairy LITTIE Gay by phone today.  What matters to the patients health and wellness today?  Maintaining health    Goals Addressed             This Visit's Progress    Heart Failure and Diabetes Management       Patient Goals/Self Care Activities: -Patient/Caregiver will self-administer medications as prescribed as evidenced by self-report/primary caregiver report  -Patient/Caregiver will attend all scheduled provider appointments as evidenced by clinician review of documented attendance to scheduled appointments and patient/caregiver report -Patient/Caregiver will call provider office for new concerns or questions as evidenced by review of documented incoming telephone call notes and patient report  -wear comfortable, well-fitting shoes -Weigh daily and record (notify MD with 3 lb weight gain over night or 5 lb in a week) -Follow CHF Action Plan -Adhere to low sodium diet   -Patient weight 275 lbs.  Reiterated daily weights and low salt intake.  He verbalized understanding.   Patient continues to wear compression hose. -Blood sugar last check 185.  Stressed limiting carbohydrates and sweets.  He verbalized understanding.  No  RN CM concerns.          SDOH assessments and interventions completed:  Yes  SDOH Interventions Today    Flowsheet Row Most Recent Value  SDOH Interventions   Food Insecurity Interventions Intervention Not Indicated  Housing Interventions Intervention Not Indicated  Transportation Interventions Intervention Not Indicated  Utilities Interventions Intervention Not Indicated  Social Connections Interventions Intervention Not Indicated  Health Literacy Interventions Intervention Not Indicated        Care Coordination Interventions:  Yes,  provided   Follow up plan: Follow up call scheduled for February    Encounter Outcome:  Patient Visit Completed   Jacklynn Dehaas J Amare Bail, RN, MSN RN Care Manager [facility] [place], Population Health Direct Dial: [phone]  Fax: [phone] Website: [email or web address]

## 2023-02-23 ENCOUNTER — Other Ambulatory Visit: Payer: Self-pay | Admitting: Family Medicine

## 2023-02-23 ENCOUNTER — Other Ambulatory Visit: Payer: Self-pay | Admitting: Internal Medicine

## 2023-02-25 DIAGNOSIS — M21612 Bunion of left foot: Secondary | ICD-10-CM | POA: Diagnosis not present

## 2023-02-25 DIAGNOSIS — E119 Type 2 diabetes mellitus without complications: Secondary | ICD-10-CM | POA: Diagnosis not present

## 2023-03-11 ENCOUNTER — Telehealth: Payer: Self-pay

## 2023-03-11 NOTE — Progress Notes (Signed)
Care Guide Pharmacy Note  03/11/2023 Name: [name] [name] MRN: [id number] DOB: Oct 19, 1955  Referred By: [name], MD Reason for referral: Care Coordination ([facility]NM Diabetes. )   [name] [name] is a 68 y.o. year old male who is a primary care patient of [name], MD.  [name] [name] was referred to the pharmacist for assistance related to: DMII  Successful contact was made with the patient to discuss pharmacy services including being ready for the pharmacist to call at least 5 minutes before the scheduled appointment time and to have medication bottles and any blood pressure readings ready for review. The patient agreed to meet with the pharmacist via telephone visit on (date/time). 03/13/23 at 9:00 a.m.   [facility]  [facility] Health Care Management Assistant Direct Dial: [phone]  Fax: [phone]

## 2023-03-13 ENCOUNTER — Other Ambulatory Visit: Payer: Self-pay | Admitting: Pharmacist

## 2023-03-13 DIAGNOSIS — I739 Peripheral vascular disease, unspecified: Secondary | ICD-10-CM

## 2023-03-13 DIAGNOSIS — R5381 Other malaise: Secondary | ICD-10-CM

## 2023-03-13 DIAGNOSIS — M545 Low back pain, unspecified: Secondary | ICD-10-CM

## 2023-03-13 NOTE — Progress Notes (Signed)
03/13/2023 Name: [name] [name] MRN: [id number] DOB: 25-Aug-1955  Chief Complaint  Patient presents with   Diabetes   Medication Management    [name] [name] is a 68 y.o. year old male who presented for a telephone visit.   They were referred to the pharmacist by a quality report for assistance in managing diabetes.    Subjective:  Care Team: Primary Care Provider: [name], MD ; Next Scheduled Visit: 04/02/2023 Endocrinologist Dr Talmage Coin; Next Scheduled Visit: Feb 2025 per patient Pulmonologist - Dr. Maple Hudson; Next Schedule Visit: 04/27/2023  Medication Access/Adherence  Current Pharmacy:  [facility] [id number] Ginette Otto, [place] - [address] AT [facility] OF [address] [facility] [address] [place] [address] Phone: [phone] Fax: [phone]  [facility] - [place], [place] - [address] [place] [address] Phone: [phone] Fax: [phone]   Patient reports affordability concerns with their medications: No  - Patient has duel eligibility with Medicare and Medicaid.  Patient reports access/transportation concerns to their pharmacy: No  Patient reports adherence concerns with their medications:  No   - Patient uses [facility] who puts most of his meds in a monthly pill box (current torsemide and losartan are not included - these prescriptions are filled at [facility] but patient would like to have included in his pill box next month)   Diabetes: Managed by Dr Sharl Ma at [facility]. Last visit was 01/26/2023 but unable to see notes or any lab results. Patient thinks his A1c in December was 8.1%.   Current medications:  Lantus 22 units subcutaneously each morning; Humalog (has both U100 and U200) - injects 18 units + correction dose per chart provided by Dr Sharl Ma based on blood glucose.   Medications tried in the past: actos, Jardiance (yeast infection / rash)  History of  pancreatitis 01/06/2007 - possibly relate to alcohol use / cirrhosis of the liver. Per patient he no longer drinks alcohol.   Current glucose readings:  morning blood glucose 140 to 200. During day per patient - blood glucose is up and down.  Using Once Touch meter; testing 6 to 8 times daily.  Patient states he tried to use a Continuous Glucose Monitoring system / sensor sometime last year but he had difficulty getting it to stay on. He states Dr Sharl Ma tried different ways / adhesives to get it to stay on but nothing helped.    Current meal patterns:  Eating 3 meals a day but states, "while cooking I taste and by the time I am finished cooking I am not very hungry." Drinks 1 regular soda a day.  Feels like diet sodas "dehydrate me"  Current physical activity: walking a little each day around the house.   Current medication access support: none  Heart Failure (EF 55-60%):  Current medications:  ACEi/ARB/ARNI: yes - losartan 100mg  daily SGLT2i: no - tied Jardiance in the past and stopped due to yeast infection / rash Beta blocker: yes - metoprolol 25mg  twice a day Mineralocorticoid Receptor Antagonist: yes - spironolactone 25mg  daily  Diuretic regimen: yes - torsemide 20mg  twice a day (takes and extra 0.5 tablet or 10mg  at lunch if weight increases or having lower extremity edema.   Current home weights: 283 lbs today and yesterday but reports his base weight is usually 275 to 280lbs  Patient reports volume overload signs or symptoms including lower extremity edema  COPD:  Current medications:Trelegy once a  day Uses albuterol inhaler or nebs as needed for SOB  Reports 1 exacerbations in the past year that required a course of corticosteroids.   Patient reports having mucus that is difficult to cough up. He has tried Mucinex in past but was not helpful. He has also taken benzonatate in the past and reports it did help with cough and mucus. This is not active on his med list at this  time.   Medication Management:  Current adherence strategy: using pill box from [facility] but losartan and torsemide are not included. Patient has been getting these 2 meds thru [facility]   Patient reports Good adherence to medications  Patient reports the following barriers to adherence:  Multiple comorbidities Complex medication regimen Difficulty coordinating care between multiple providers   Objective:  Lab Results  Component Value Date   HGBA1C 8.2 05/25/2022    Lab Results  Component Value Date   CREATININE 1.19 12/15/2022   BUN 11 12/15/2022   NA 139 01/09/2023   K 2.9 (L) 01/09/2023   CL 99 12/15/2022   CO2 24 12/15/2022    Lab Results  Component Value Date   CHOL 130 04/08/2022   HDL 46 04/08/2022   LDLCALC 53 04/08/2022   LDLDIRECT 71.0 10/07/2018   TRIG 186 (H) 04/08/2022   CHOLHDL 2.8 04/08/2022    Medications Reviewed Today     Reviewed by Henrene Pastor, RPH-CPP (Pharmacist) on 03/13/23 at [phone]  Med List Status: <None>   Medication Order Taking? Sig Documenting Provider Last Dose Status Informant  Accu-Chek Softclix Lancets lancets [id number] Yes TEST MORNING, NOON AND BEDTIME [name], MD Taking Active Self  albuterol (ACCUNEB) 0.63 MG/3ML nebulizer solution [id number] Yes Take 1 ampule by nebulization every 6 (six) hours as needed for wheezing. [provider] Taking Active Self  albuterol (VENTOLIN HFA) 108 (90 Base) MCG/ACT inhaler [id number] Yes INHALE TWO PUFFS BY MOUTH EVERY SIX HOURS AS NEEDED FOR WHEEZING OR SHORTNESS OF Justice Britain D, MD Taking Active   aspirin EC 81 MG tablet [id number] Yes Take 81 mg by mouth daily. [provider] Taking Active Self  B-D ULTRAFINE III SHORT PEN 31G X 8 MM MISC [id number] Yes USE AS DIRECTED [name], MD Taking Active Self  Blood Glucose Monitoring Suppl DEVI [id number] Yes 1 each by Does not apply route in the morning, at noon, and at bedtime. May substitute to any  manufacturer covered by patient's insurance.Dx E11.69 [name], MD Taking Active Self  diclofenac Sodium (VOLTAREN) 1 % GEL [id number] No APPLY 2 GRAMS TOPICALLY 4 TIMES DAILY TO KNEE  Patient not taking: Reported on 03/13/2023   [name], MD Not Taking Active Self  ELIQUIS 5 MG TABS tablet [id number] Yes TAKE ONE TABLET BY MOUTH TWICE DAILY (AM+PM) [name], MD Taking Active   famotidine (PEPCID) 20 MG tablet [id number] Yes TAKE ONE TABLET BY MOUTH ONCE DAILY USE AS NEEDED FOR HEARTBURN Jake Bathe, MD Taking Active   fluticasone (FLONASE) 50 MCG/ACT nasal spray [id number] Yes Place 1 spray into both nostrils daily. [name], MD Taking Active   Fluticasone-Umeclidin-[facility] ELLIPTA) 100-62.5-25 MCG/ACT AEPB [id number] Yes INHALE 1 PUFF INTO THE LUNGS DAILY Jetty Duhamel D, MD Taking Active   formoterol (PERFOROMIST) 20 MCG/2ML nebulizer solution [id number] Yes Take 20 mcg by nebulization every 12 (twelve) hours. [provider] Taking Active Self  glucose blood (ONETOUCH ULTRA TEST) test strip [id number] Yes Use as instructed Jacquiline Doe  M, MD Taking Active   HUMALOG 100 UNIT/ML injection [id number]  Inject 0.2 mLs (20 Units total) into the skin 3 (three) times daily with meals. Sliding scale [name], MD  Active   HUMALOG KWIKPEN 200 UNIT/ML KwikPen [id number] Yes INJECT 18 UNITS PLUS CORRECTIONS SCALE AT MEALS UPTO 90 UNITS PER DAY [provider] Taking Active   hydroxypropyl methylcellulose / hypromellose (ISOPTO TEARS / GONIOVISC) 2.5 % ophthalmic solution [id number]  Place 2 drops into both eyes as needed for dry eyes. [name], MD  Active Self  LANTUS SOLOSTAR 100 UNIT/ML Solostar Pen [id number] Yes Inject 20 Units into the skin every morning.  Patient taking differently: Inject 22 Units into the skin every morning.   Dorcas Carrow, MD Taking Active Self  lidocaine (LIDODERM) 5 % [id number] Yes Place 1 patch onto the skin daily.  Remove & Discard patch within 12 hours or as directed by MD Sabas Sous, MD Taking Active Self  LINZESS 290 MCG CAPS capsule [id number] Yes TAKE 1 CAPSULE(290 MCG) BY MOUTH DAILY BEFORE BREAKFAST  Patient taking differently: Take 290 mcg by mouth daily as needed.   [name], MD Taking Active Self  losartan (COZAAR) 100 MG tablet [id number] Yes Take 1 tablet (100 mg total) by mouth daily. Dorthula Nettles, DO Taking Active Self  MAGNESIUM-OXIDE 400 (241.3 Mg) MG tablet [id number] Yes Take 1 tablet by mouth daily. [provider] Taking Active Self  metoprolol tartrate (LOPRESSOR) 25 MG tablet [id number] Yes TAKE 1/2 TABLET BY MOUTH TWICE A DAY (1/2AM+1/2PM) [name], MD Taking Active Self  nitroGLYCERIN (NITROSTAT) 0.4 MG SL tablet [id number]  Place 1 tablet (0.4 mg total) under the tongue every 5 (five) minutes as needed for chest pain. Jake Bathe, MD  Active   polyethylene glycol (MIRALAX / GLYCOLAX) 17 g packet [id number] Yes Take 17 g by mouth daily.  Patient taking differently: Take 17 g by mouth daily as needed.   Osvaldo Shipper, MD Taking Active Self  potassium chloride SA (KLOR-CON M) 20 MEQ tablet [id number] Yes Take 2 tablets (40 mEq total) by mouth 2 (two) times daily. Tereso Newcomer T, PA-C Taking Active Self  rosuvastatin (CRESTOR) 20 MG tablet [id number] Yes TAKE ONE TABLET BY MOUTH EVERY DAY IN THE Laverle Hobby, MD Taking Active   silver sulfADIAZINE (SSD) 1 % cream [id number]  APPLY PEA SIZED AMOUNT TOPICALLY TO WOUND DAILY [name], MD  Active   spironolactone (ALDACTONE) 25 MG tablet [id number] Yes Take 1 tablet (25 mg total) by mouth daily. Tereso Newcomer T, PA-C Taking Active Self  torsemide (DEMADEX) 20 MG tablet [id number] Yes TAKE 1 TABLET(20 MG) BY MOUTH TWICE DAILY Jake Bathe, MD Taking Active Self            SDOH Interventions Today    Flowsheet Row Most Recent Value  SDOH Interventions   Alcohol Usage Interventions  Intervention Not Indicated (Score <7)  Physical Activity Interventions Other (Comments)  [encouraged increase length of exercise]     Patient also mentions that he had difficulty bathing. His apartment does not have a bath / shower that he can easily use. No grab bars. He does have a shower chair. He also had difficulty reaching his back to clean well.   Assessment/Plan:   Diabetes: - Currently uncontrolled - Reviewed goal A1c, goal fasting, and goal 2 hour post prandial glucose - Reviewed dietary modifications including - stop intake of sugar containing beverages. Drink water  or unsweetened tea instead.  - Reviewed lifestyle modifications including: increasing exercise.  - requested last office notes and A1c results from Dr Daune Perch office.  - Continue to follow up with Dr Sharl Ma and take insulin as instructed.  - Discussed pros and cons of GLP1 / Ozempic - patient has history of pancreatitis in 2008 which likely was related to alcohol use but usually precludes use of GLP1s. Patient to discuss further with Dr Sharl Ma. Could also consider retrial of SGLT2 - Farxiga instead of Jardiance but could have similar adverse effect of yeast infection.  - Recommend to check glucose 6 to 8 times a day. Discussed retrial of Continuous Glucose Monitor, maybe DexCom to see if adhesion would be better - also only worn for 10 days. Patient declined.    Heart Failure: - Currently appropriately managed - Reviewed to weigh daily and when to contact cardiology with weight gain - Recommend to take extra 0.5 tablet of torsemide for 3 days. If weight and lower extremity edema do no improve, or if he experience worsening symptoms, call office.    COPD: - Currently controlled.  - Recommend to contine trelelgy daily and albuterol as needed.  - Recommended patient follow up with Dr Roxy Cedar office about CPAP machine and benzonatate.    Medication Management: - Continue to use weekly pill box to organize medications.  Contacted [facility] to transfer losartan and torsemide so that they can be included in next monthly pill box.  SDOH:  - Will refer to social worker for evaluation for in home care for bathing or to share possible resources for getting equipment to make bathing easier in his apartment.    Follow Up Plan: 1 month   Henrene Pastor, PharmD Clinical Pharmacist [facility]  Population Health [phone]

## 2023-03-16 ENCOUNTER — Telehealth: Payer: Self-pay | Admitting: Cardiology

## 2023-03-16 ENCOUNTER — Telehealth: Payer: Self-pay | Admitting: *Deleted

## 2023-03-16 NOTE — Progress Notes (Signed)
Complex Care Management Note  Care Guide Note 03/16/2023 Name: MOSE COLAIZZI MRN: [id number] DOB: [birth year]  NIXON KOLTON is a 68 y.o. year old male who sees [name], MD for primary care. I reached out to [name] by phone today to offer complex care management services.  Mr. Nevares was given information about Complex Care Management services today including:   The Complex Care Management services include support from the care team which includes your Nurse Coordinator, Clinical Social Worker, or Pharmacist.  The Complex Care Management team is here to help remove barriers to the health concerns and goals most important to you. Complex Care Management services are voluntary, and the patient may decline or stop services at any time by request to their care team member.   Complex Care Management Consent Status: Patient agreed to services and verbal consent obtained.   Follow up plan:  Telephone appointment with complex care management team member scheduled for:  03/26/2023  Encounter Outcome:  Patient Scheduled  Burman Nieves, CMA, Care Guide [facility]  [facility], [facility] Guide Direct Dial: [phone]  Fax: [phone] Website: [email or web address]

## 2023-03-16 NOTE — Telephone Encounter (Signed)
Spoke with pt and he stated he isn't having CP but is having a "slight burning" sensation and wanted to call in and just check with [place]. Pt stated he is having LE swelling and hasn't been able to wear compression socks because his current pair has holes in them. He got a new pair today and is currently wearing them. Pt states that he normally is about 275 lbs and is now at 280/281 lbs and that is a 1 week difference in measurements. Pt is currently on Torsemide 20 mg BID but stated he was told to take an additional 0.5 tablet at lunch for 3 days however he claims he has not been doing that. Explained to pt that we will forward this information to Dr. Anne Fu to address and see what his recommendations are and if the pt begins having CP or SOB, he needs to go to the ED to be evaluated. Pt agreed and had no further questions at this time.

## 2023-03-16 NOTE — Telephone Encounter (Signed)
   Pt c/o of Chest Pain: STAT if active CP, including tightness, pressure, jaw pain, radiating pain to shoulder/upper arm/back, CP unrelieved by Nitro. Symptoms reported of SOB, nausea, vomiting, sweating.  1. Are you having CP right now?  Patient states it feels like a "burning" in his chest  2. Are you experiencing any other symptoms (ex. SOB, nausea, vomiting, sweating)?   No  3. Is your CP continuous or coming and going?   Comes and goes  4. Have you taken Nitroglycerin?   No  5. How long have you been experiencing CP?    Patient stated off and on since last week.  6. If NO CP at time of call then end call with telling Pt to call back or call 911 if Chest pain returns prior to return call from triage team.   Patient is concerned about the "burning" sensation in his chest, which he said is different from indigestion.  Patient noted his heart rate ranges from 89-92 unless he's walking.

## 2023-03-17 DIAGNOSIS — L603 Nail dystrophy: Secondary | ICD-10-CM | POA: Diagnosis not present

## 2023-03-17 DIAGNOSIS — L84 Corns and callosities: Secondary | ICD-10-CM | POA: Diagnosis not present

## 2023-03-17 DIAGNOSIS — E1142 Type 2 diabetes mellitus with diabetic polyneuropathy: Secondary | ICD-10-CM | POA: Diagnosis not present

## 2023-03-17 DIAGNOSIS — I739 Peripheral vascular disease, unspecified: Secondary | ICD-10-CM | POA: Diagnosis not present

## 2023-03-17 DIAGNOSIS — E1151 Type 2 diabetes mellitus with diabetic peripheral angiopathy without gangrene: Secondary | ICD-10-CM | POA: Diagnosis not present

## 2023-03-17 IMAGING — DX DG CHEST 2V
2 series · 2 of 2 positions shown · non-contrast
Comparison: Radiograph 10/17/2020

CLINICAL DATA: Follow-up bronchitis

EXAM:
CHEST - 2 VIEW

[chest pa]
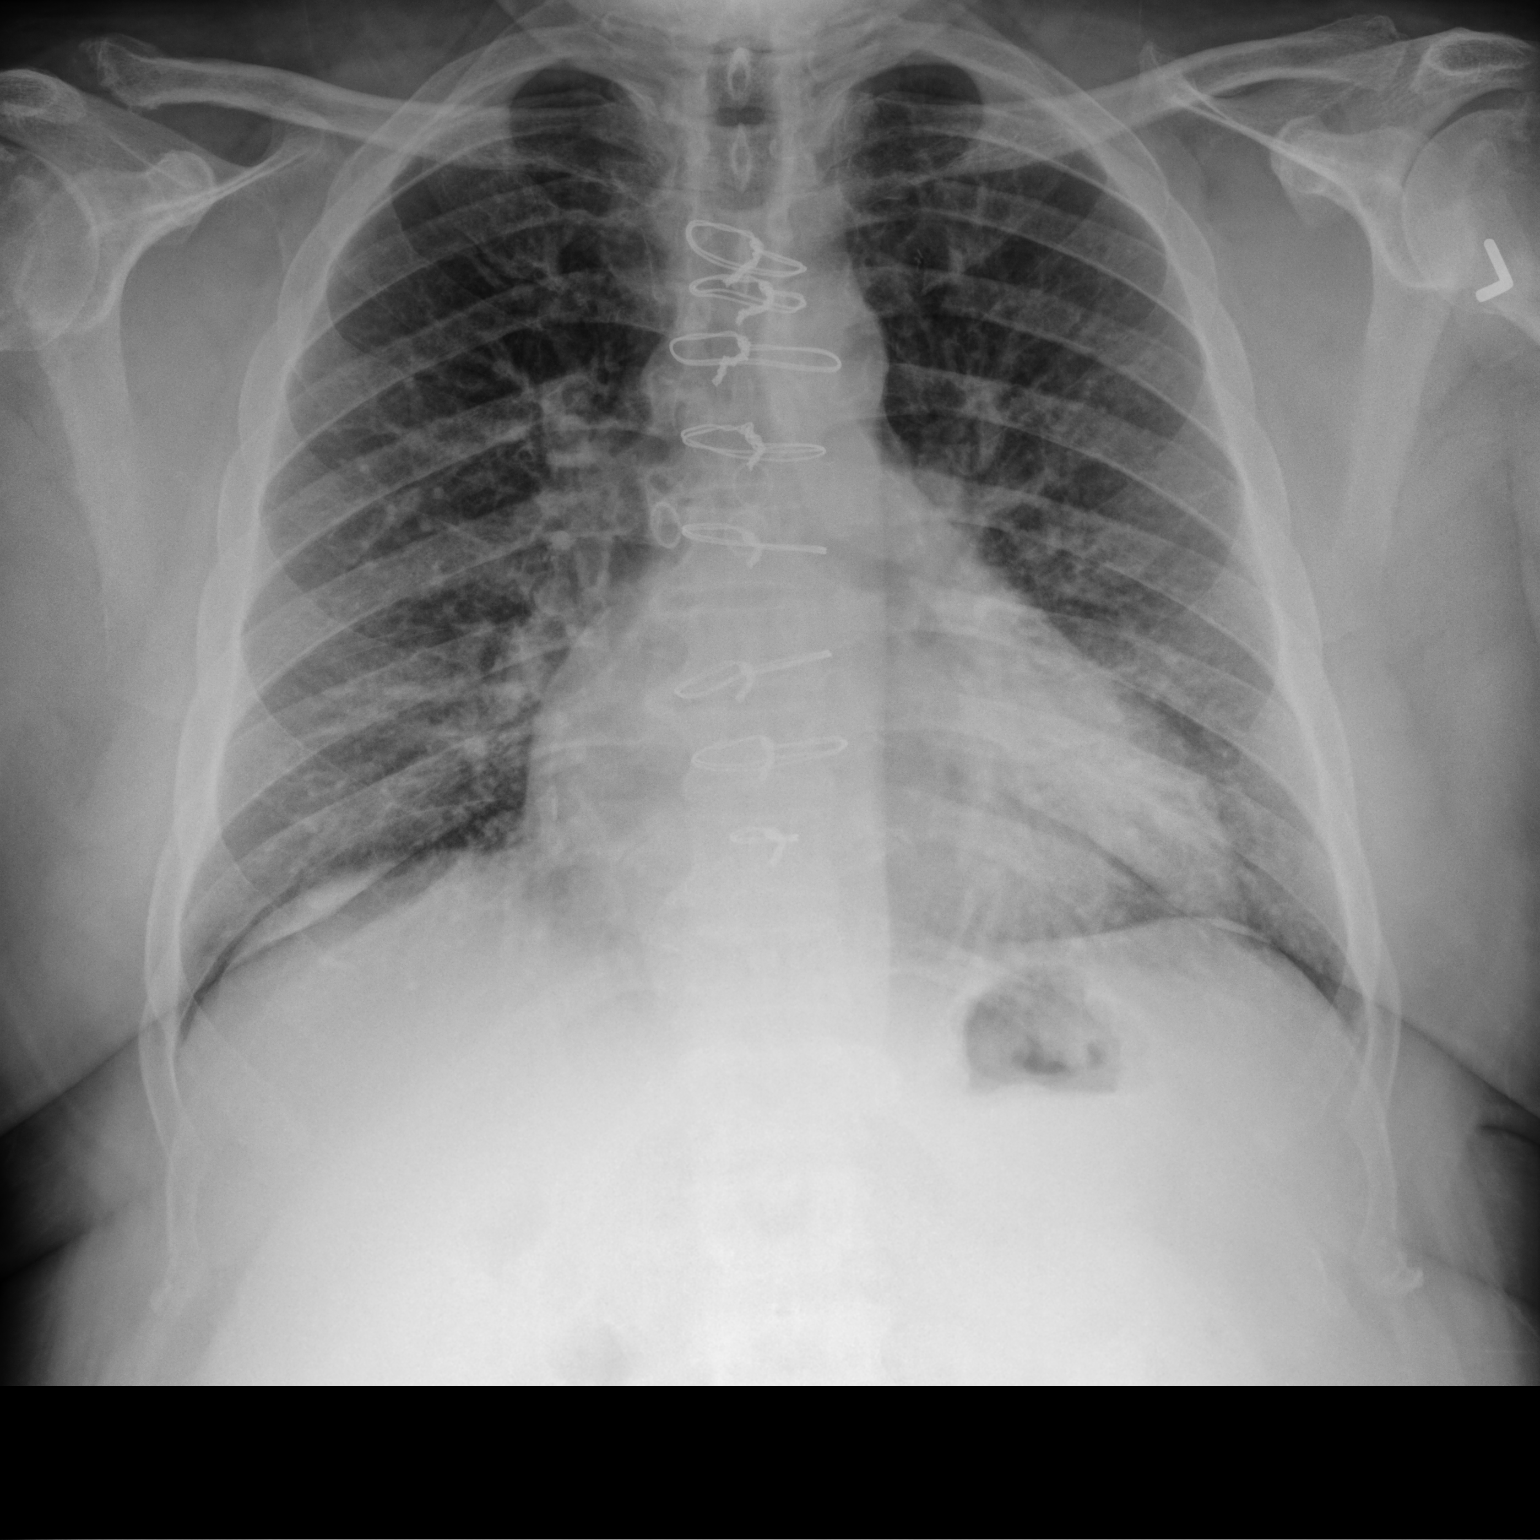

[chest lat]
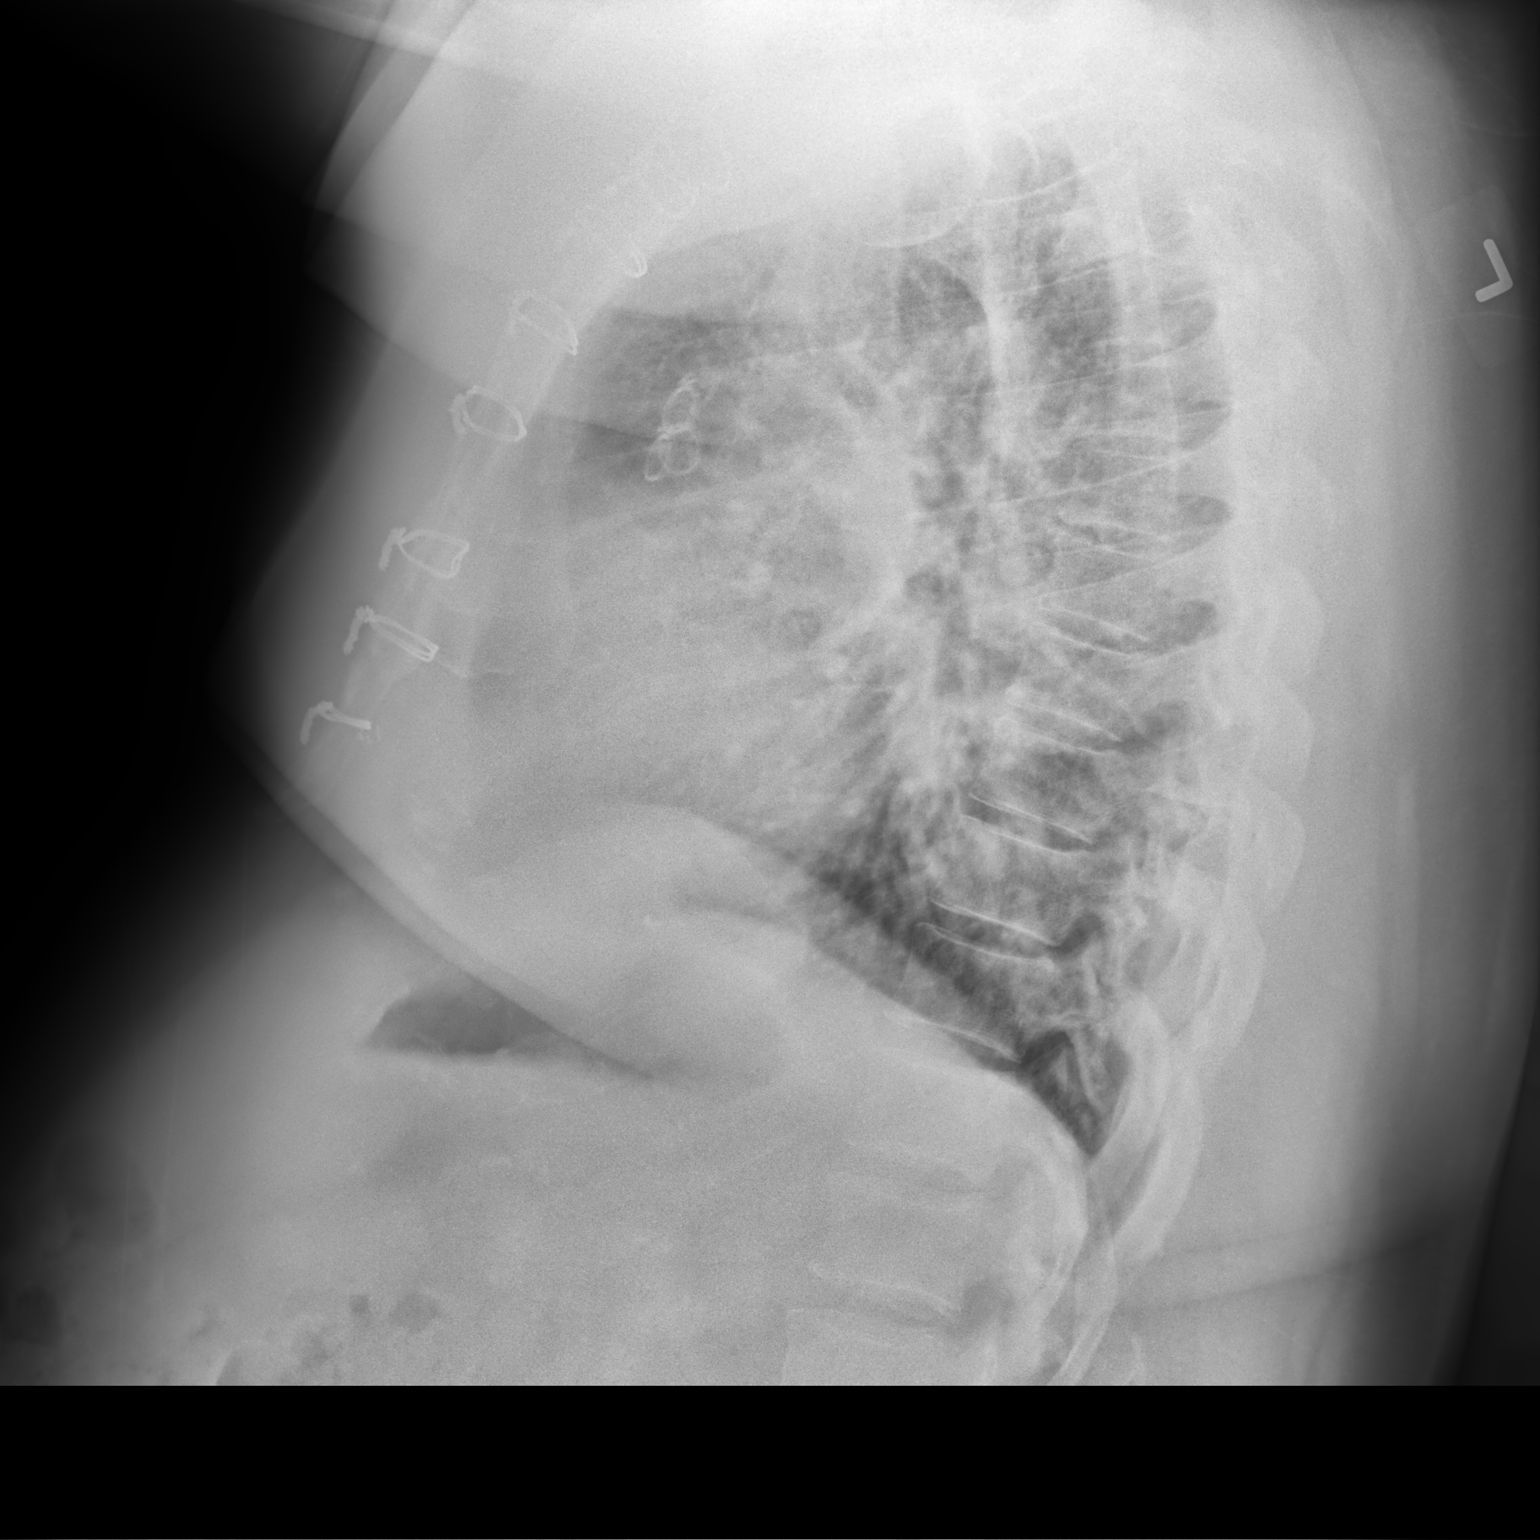

[2 of 2 positions shown; findings below may reference images not displayed]

FINDINGS: Unchanged, enlarged cardiac silhouette with prior median sternotomy
and CABG. There is mild central pulmonary vascular prominence and
persistent mild central peribronchial thickening. No new airspace
disease. No large pleural effusion or visible pneumothorax. No acute
osseous abnormality. Unchanged appearance of the right AC joint.
IMPRESSION: Central pulmonary vascular prominence and persistent mild central
peribronchial thickening. No new airspace disease.

## 2023-03-17 NOTE — Telephone Encounter (Signed)
LVM for pt stating that Dr. Anne Fu agrees with taking the additional Torsemide as directed. Pt told to call back with any questions.

## 2023-03-18 ENCOUNTER — Encounter (INDEPENDENT_AMBULATORY_CARE_PROVIDER_SITE_OTHER): Payer: 59 | Admitting: Family Medicine

## 2023-03-19 ENCOUNTER — Ambulatory Visit: Payer: Self-pay

## 2023-03-19 NOTE — Progress Notes (Signed)
 Erroneous encounter

## 2023-03-19 NOTE — Patient Instructions (Signed)
 Visit Information  Thank you for taking time to visit with me today. Please don't hesitate to contact me if I can be of assistance to you.   Following are the goals we discussed today:   Goals Addressed             This Visit's Progress    Heart Failure and Diabetes Management       Patient Goals/Self Care Activities: -Patient/Caregiver will self-administer medications as prescribed as evidenced by self-report/primary caregiver report  -Patient/Caregiver will attend all scheduled provider appointments as evidenced by clinician review of documented attendance to scheduled appointments and patient/caregiver report -Patient/Caregiver will call provider office for new concerns or questions as evidenced by review of documented incoming telephone call notes and patient report  -wear comfortable, well-fitting shoes -Weigh daily and record (notify MD with 3 lb weight gain over night or 5 lb in a week) -Follow CHF Action Plan -Adhere to low sodium diet   -Patient weight up to 283 lbs.  He states he is taking an extra fluid pill at lunch due to fluid accumulation.  He states weight is coming down now.  Discussion with patient about importance of salt intake and even seasonings he uses for his food such as meat tenderizer and other salt seasonings.  Also discussed using frozen foods over canned foods and limiting bacon and sausage intake.   Reiterated daily weights and when to call physician.  He verbalized understanding.   Patient continues to wear compression hose. -Blood sugar last check 140.  Reiterated limiting carbohydrates and sweets.  He verbalized understanding.  No  RN CM concerns.          Our next appointment is by telephone on 04/15/23 at 1030 am  Please call the care guide team at [phone] if you need to cancel or reschedule your appointment.   If you are experiencing a Mental Health or Behavioral Health Crisis or need someone to talk to, please call the Suicide and Crisis  Lifeline: 988   Patient verbalizes understanding of instructions and care plan provided today and agrees to view in MyChart. Active MyChart status and patient understanding of how to access instructions and care plan via MyChart confirmed with patient.     The patient has been provided with contact information for the care management team and has been advised to call with any health related questions or concerns.   Trisa Cranor J. Edith Groleau RN, MSN [facility], [facility] RN Care Manager Direct Dial: [phone]  Fax: [phone] Website: [email or web address]

## 2023-03-19 NOTE — Patient Outreach (Signed)
  Care Coordination   Follow Up Visit Note   03/19/2023 Name: [name] [name] MRN: [id number] DOB: [birth year]  [name] [name] is a 68 y.o. year old male who sees Kennyth Worth HERO, MD for primary care. I spoke with  [name] [name] by phone today.  What matters to the patients health and wellness today?  Increased fluid.    Goals Addressed             This Visit's Progress    Heart Failure and Diabetes Management       Patient Goals/Self Care Activities: -Patient/Caregiver will self-administer medications as prescribed as evidenced by self-report/primary caregiver report  -Patient/Caregiver will attend all scheduled provider appointments as evidenced by clinician review of documented attendance to scheduled appointments and patient/caregiver report -Patient/Caregiver will call provider office for new concerns or questions as evidenced by review of documented incoming telephone call notes and patient report  -wear comfortable, well-fitting shoes -Weigh daily and record (notify MD with 3 lb weight gain over night or 5 lb in a week) -Follow CHF Action Plan -Adhere to low sodium diet   -Patient weight up to 283 lbs.  He states he is taking an extra fluid pill at lunch due to fluid accumulation.  He states weight is coming down now.  Discussion with patient about importance of salt intake and even seasonings he uses for his food such as meat tenderizer and other salt seasonings.  Also discussed using frozen foods over canned foods and limiting bacon and sausage intake.   Reiterated daily weights and when to call physician.  He verbalized understanding.   Patient continues to wear compression hose. -Blood sugar last check 140.  Reiterated limiting carbohydrates and sweets.  He verbalized understanding.  No  RN CM concerns.          SDOH assessments and interventions completed:  Yes     Care Coordination Interventions:  Yes, provided   Follow up plan: Follow up call scheduled for March     Encounter Outcome:  Patient Visit Completed   Royston Bekele J. Kynan Peasley RN, MSN [facility], [facility] RN Care Manager Direct Dial: [phone]  Fax: [phone] Website: [email or web address]

## 2023-03-24 ENCOUNTER — Other Ambulatory Visit: Payer: Self-pay | Admitting: Family Medicine

## 2023-03-26 ENCOUNTER — Ambulatory Visit: Payer: Self-pay

## 2023-03-26 NOTE — Patient Instructions (Signed)
Visit Information  Thank you for taking time to visit with me today. Please don't hesitate to contact me if I can be of assistance to you.   Following are the goals we discussed today:  Patient will purchase a battery scrubber online.   If you are experiencing a Mental Health or Behavioral Health Crisis or need someone to talk to, please call 911  Patient verbalizes understanding of instructions and care plan provided today and agrees to view in MyChart. Active MyChart status and patient understanding of how to access instructions and care plan via MyChart confirmed with patient.     No further follow up required: Patient does not request a follow up visit.  Lysle Morales, BSW [place]  [facility], [facility] Social Worker Direct Dial: [phone]  Fax: [phone] Website: [email or web address]

## 2023-03-26 NOTE — Patient Outreach (Signed)
  Care Coordination   Initial Visit Note   03/26/2023 Name: [name] [name] MRN: [id number] DOB: 11-Nov-1955  [name] [name] is a 68 y.o. year old male who sees [name], MD for primary care. I spoke with  [name] [name] by phone today.  What matters to the patients health and wellness today?  Patient wants his back washed.    Goals Addressed             This Visit's Progress    Care Coordination Activities       Interventions Today    Flowsheet Row Most Recent Value  Chronic Disease   Chronic disease during today's visit Congestive Heart Failure (CHF), Hypertension (HTN), Diabetes  General Interventions   General Interventions Discussed/Reviewed General Interventions Discussed, General Interventions Reviewed  [Pt wants back washed.Pt feels he can manage all ADL's but the scrubber isn't enough.No family to assist.Live in male had a stroke & can't help.Pt plans to order a battery scrubber online.Pt has a shower bench & understands no modification to rental.]  Education Interventions   Education Provided Provided Education  [SW educated that Medicaid.Pt can perform all ADL's and Medicaid may not cover for only back washing. Doctor would have to determine need for order.]  Safety Interventions   Safety Discussed/Reviewed --  [Pt is able to use the shower with the bench.]              SDOH assessments and interventions completed:  Yes  SDOH Interventions Today    Flowsheet Row Most Recent Value  SDOH Interventions   Food Insecurity Interventions Intervention Not Indicated, Other (Comment)  [Gets Foodstamps and [employer]]  Housing Interventions Intervention Not Indicated  Transportation Interventions Intervention Not Indicated  Utilities Interventions Intervention Not Indicated        Care Coordination Interventions:  Yes, provided   Follow up plan: No further intervention required.   Encounter Outcome:  Patient Visit Completed

## 2023-04-02 ENCOUNTER — Telehealth: Payer: Self-pay

## 2023-04-02 ENCOUNTER — Encounter: Payer: 59 | Admitting: Family Medicine

## 2023-04-02 NOTE — Patient Outreach (Signed)
  Care Coordination   Follow Up Visit Note   04/02/2023 Name: [name] [name] MRN: [id number] DOB: 18-Aug-1955  [name] [name] is a 68 y.o. year old male who sees [name], MD for primary care. I spoke with  [name] [name] by phone today.  What matters to the patients health and wellness today?  Continue to lose weight    Goals Addressed             This Visit's Progress    Heart Failure and Diabetes Management       Patient Goals/Self Care Activities: -Patient/Caregiver will self-administer medications as prescribed as evidenced by self-report/primary caregiver report  -Patient/Caregiver will attend all scheduled provider appointments as evidenced by clinician review of documented attendance to scheduled appointments and patient/caregiver report -Patient/Caregiver will call provider office for new concerns or questions as evidenced by review of documented incoming telephone call notes and patient report  -wear comfortable, well-fitting shoes -Weigh daily and record (notify MD with 3 lb weight gain over night or 5 lb in a week) -Follow CHF Action Plan -Adhere to low sodium diet   -Patient weight down to 283 lbs.  He states he is back on his diet and is leaving the sodas alone.  Reiterated importance of diet and how it can cause weight gain.  He verbalized understanding.  He had reached out to CM earlier but did not leave a message.  He states he was trying to change his MD appointment due to the weather.  He did get to change his appointment.  He continues to wear compression hose. -Diabetes management continued.  Continues to limit carbohydrates and sweets.  He verbalized understanding.  No  RN CM concerns.          SDOH assessments and interventions completed:  Yes     Care Coordination Interventions:  Yes, provided   Follow up plan: Follow up call scheduled for March    Encounter Outcome:  Patient Visit Completed   Bary Leriche RN, MSN [facility], [facility] RN Care Manager Direct Dial: [phone]  Fax: [phone] Website: [email or web address]

## 2023-04-02 NOTE — Patient Instructions (Signed)
Visit Information  Thank you for taking time to visit with me today. Please don't hesitate to contact me if I can be of assistance to you.   Following are the goals we discussed today:   Goals Addressed             This Visit's Progress    Heart Failure and Diabetes Management       Patient Goals/Self Care Activities: -Patient/Caregiver will self-administer medications as prescribed as evidenced by self-report/primary caregiver report  -Patient/Caregiver will attend all scheduled provider appointments as evidenced by clinician review of documented attendance to scheduled appointments and patient/caregiver report -Patient/Caregiver will call provider office for new concerns or questions as evidenced by review of documented incoming telephone call notes and patient report  -wear comfortable, well-fitting shoes -Weigh daily and record (notify MD with 3 lb weight gain over night or 5 lb in a week) -Follow CHF Action Plan -Adhere to low sodium diet   -Patient weight down to 283 lbs.  He states he is back on his diet and is leaving the sodas alone.  Reiterated importance of diet and how it can cause weight gain.  He verbalized understanding.  He had reached out to CM earlier but did not leave a message.  He states he was trying to change his MD appointment due to the weather.  He did get to change his appointment.  He continues to wear compression hose. -Diabetes management continued.  Continues to limit carbohydrates and sweets.  He verbalized understanding.  No  RN CM concerns.          Our next appointment is by telephone on 04/15/23 at 1030  Please call the care guide team at [phone] if you need to cancel or reschedule your appointment.   If you are experiencing a Mental Health or Behavioral Health Crisis or need someone to talk to, please call the Suicide and Crisis Lifeline: 988   Patient verbalizes understanding of instructions and care plan provided today and agrees to view in  MyChart. Active MyChart status and patient understanding of how to access instructions and care plan via MyChart confirmed with patient.     The patient has been provided with contact information for the care management team and has been advised to call with any health related questions or concerns.   Bary Leriche RN, MSN [facility], [facility] RN Care Manager Direct Dial: [phone]  Fax: [phone] Website: [email or web address]

## 2023-04-09 DIAGNOSIS — J45998 Other asthma: Secondary | ICD-10-CM | POA: Diagnosis not present

## 2023-04-13 ENCOUNTER — Other Ambulatory Visit: Payer: Self-pay | Admitting: Cardiology

## 2023-04-13 ENCOUNTER — Other Ambulatory Visit: Payer: Self-pay | Admitting: Family Medicine

## 2023-04-15 ENCOUNTER — Ambulatory Visit: Payer: Self-pay

## 2023-04-15 ENCOUNTER — Ambulatory Visit: Payer: Self-pay | Admitting: Family Medicine

## 2023-04-15 NOTE — Telephone Encounter (Signed)
 Message from [place] C sent at 04/15/2023 10:54 AM EST  Copied From CRM [phone]. Reason for Triage: pt called and stated that he is having bad knee pain. Rated the pain level at a 10. Says he can barely move it. Pt declined scheduling and asked to speak with a nurse first. Please call and advise.    Chief Complaint: left knee pain Symptoms: severe pain with walking and sitting Frequency: ongoing (Monday) Pertinent Negatives: Patient denies calf pain, swelling or redness Disposition: [] ED /[] Urgent Care (no appt availability in office) / [x] Appointment(In office/virtual)/ []  [facility] [facility]/ [] Home Care/ [] Refused Recommended Disposition /[] [facility] [place] [facility]/ []  Follow-up with PCP Additional Notes: pt stated unable tocome in for treatment tomorrow. Pt stated he will call back. Made appointment for Friday. Pt advised to call if cannot come to get any care.   Reason for Disposition  [1] SEVERE pain (e.g., excruciating, unable to walk) AND [2] not improved after 2 hours of pain medicine  Answer Assessment - Initial Assessment Questions 1. LOCATION and RADIATION: "Where is the pain located?"     Left knee  2. QUALITY: "What does the pain feel like?"  (e.g., sharp, dull, aching, burning)     Ache  3. SEVERITY: "How bad is the pain?" "What does it keep you from doing?"   (Scale 1-10; or mild, moderate, severe)   -  MILD (1-3): doesn't interfere with normal activities    -  MODERATE (4-7): interferes with normal activities (e.g., work or school) or awakens from sleep, limping    -  SEVERE (8-10): excruciating pain, unable to do any normal activities, unable to walk     10/10 4. ONSET: "When did the pain start?" "Does it come and go, or is it there all the time?"     Monday  6. SETTING: "Has there been any recent work, exercise or other activity that involved that part of the body?"      Walking sitting 7. AGGRAVATING FACTORS: "What makes the knee pain worse?" (e.g., walking, climbing  stairs, running)     walking 8. ASSOCIATED SYMPTOMS: "Is there any swelling or redness of the knee?"     No  9. OTHER SYMPTOMS: "Do you have any other symptoms?" (e.g., chest pain, difficulty breathing, fever, calf pain)     no  Protocols used: Knee Pain-A-AH

## 2023-04-15 NOTE — Patient Instructions (Signed)
 Visit Information  Thank you for taking time to visit with me today. Please don't hesitate to contact me if I can be of assistance to you.   Following are the goals we discussed today:   Goals Addressed             This Visit's Progress    Heart Failure and Diabetes Management       Care Coordination Interventions: Provided education to patient about basic DM disease process Review of patient status, including review of consultants reports, relevant laboratory and other test results, and medications completed  Patient Goals/Self Care Activities: -Patient/Caregiver will self-administer medications as prescribed as evidenced by self-report/primary caregiver report  -Patient/Caregiver will attend all scheduled provider appointments as evidenced by clinician review of documented attendance to scheduled appointments and patient/caregiver report -Patient/Caregiver will call provider office for new concerns or questions as evidenced by review of documented incoming telephone call notes and patient report  -wear comfortable, well-fitting shoes -Weigh daily and record (notify MD with 3 lb weight gain over night or 5 lb in a week) -Follow CHF Action Plan -Adhere to low sodium diet   -Patient reports doing okay.  He reports some left knee pain today.  He rates at 10/10.  He takes tylenol for pain and uses rubs and creams to help.  He states if it continues he will get back in touch with his orthopedic.  He states he needs surgery but he is not a candidate.  Current weight 287 lbs.  He denies swelling or shortness of breath.  Discussed that weight is creeping back up.  Really warned him about using seasoning that contain salt and also foods that contain lots of salt.  He verbalized understanding.  Blood sugar this am 178.  He reports his sugars have been good but goes up depending on what he eats.  Reviewed diabetes management.        Our next appointment is by telephone on 05/13/23 at 1030  am  Please call the care guide team at [phone] if you need to cancel or reschedule your appointment.   If you are experiencing a Mental Health or Behavioral Health Crisis or need someone to talk to, please call the Suicide and Crisis Lifeline: 988   Patient verbalizes understanding of instructions and care plan provided today and agrees to view in MyChart. Active MyChart status and patient understanding of how to access instructions and care plan via MyChart confirmed with patient.     The patient has been provided with contact information for the care management team and has been advised to call with any health related questions or concerns.   Bary Leriche RN, MSN [facility], [facility] Health RN Care Manager Direct Dial: [phone]  Fax: [phone] Website: [email or web address]

## 2023-04-15 NOTE — Telephone Encounter (Signed)
 Patient has an OV on 04/17/2023

## 2023-04-15 NOTE — Patient Outreach (Signed)
 Care Coordination   Follow Up Visit Note   04/15/2023 Name: DOROTEO NICKOLSON MRN: [id number] DOB: 11/28/1955  GRYFFIN ALTICE is a 68 y.o. year old male who sees [name], MD for primary care. I spoke with  [name] by phone today.  What matters to the patients health and wellness today?  Left knee pain    Goals Addressed             This Visit's Progress    Heart Failure and Diabetes Management       Care Coordination Interventions: Provided education to patient about basic DM disease process Review of patient status, including review of consultants reports, relevant laboratory and other test results, and medications completed  Patient Goals/Self Care Activities: -Patient/Caregiver will self-administer medications as prescribed as evidenced by self-report/primary caregiver report  -Patient/Caregiver will attend all scheduled provider appointments as evidenced by clinician review of documented attendance to scheduled appointments and patient/caregiver report -Patient/Caregiver will call provider office for new concerns or questions as evidenced by review of documented incoming telephone call notes and patient report  -wear comfortable, well-fitting shoes -Weigh daily and record (notify MD with 3 lb weight gain over night or 5 lb in a week) -Follow CHF Action Plan -Adhere to low sodium diet   -Patient reports doing okay.  He reports some left knee pain today.  He rates at 10/10.  He takes tylenol for pain and uses rubs and creams to help.  He states if it continues he will get back in touch with his orthopedic.  He states he needs surgery but he is not a candidate.  Current weight 287 lbs.  He denies swelling or shortness of breath.  Discussed that weight is creeping back up.  Really warned him about using seasoning that contain salt and also foods that contain lots of salt.  He verbalized understanding.  Blood sugar this am 178.  He reports his sugars have been good but goes  up depending on what he eats.  Reviewed diabetes management.        SDOH assessments and interventions completed:  Yes     Care Coordination Interventions:  Yes, provided   Follow up plan: Follow up call scheduled for April    Encounter Outcome:  Patient Visit Completed   Bary Leriche RN, MSN [facility], [facility] Health RN Care Manager Direct Dial: [phone]  Fax: [phone] Website: [email or web address]

## 2023-04-16 DIAGNOSIS — E1165 Type 2 diabetes mellitus with hyperglycemia: Secondary | ICD-10-CM | POA: Diagnosis not present

## 2023-04-16 DIAGNOSIS — E876 Hypokalemia: Secondary | ICD-10-CM | POA: Diagnosis not present

## 2023-04-16 DIAGNOSIS — Z794 Long term (current) use of insulin: Secondary | ICD-10-CM | POA: Diagnosis not present

## 2023-04-16 DIAGNOSIS — E1142 Type 2 diabetes mellitus with diabetic polyneuropathy: Secondary | ICD-10-CM | POA: Diagnosis not present

## 2023-04-16 DIAGNOSIS — Z8719 Personal history of other diseases of the digestive system: Secondary | ICD-10-CM | POA: Diagnosis not present

## 2023-04-16 LAB — CBC: RBC: 4.22 (ref 3.87–5.11)

## 2023-04-16 LAB — CBC AND DIFFERENTIAL
HCT: 41 (ref 41–53)
Hemoglobin: 14.2 (ref 13.5–17.5)
Platelets: 216 10*3/uL (ref 150–400)
WBC: 8.7

## 2023-04-16 LAB — LAB REPORT - SCANNED: A1c: 12

## 2023-04-16 LAB — HEMOGLOBIN A1C: Hemoglobin A1C: 12

## 2023-04-17 ENCOUNTER — Encounter: Payer: Self-pay | Admitting: Family Medicine

## 2023-04-17 ENCOUNTER — Telehealth: Payer: Self-pay | Admitting: Pharmacist

## 2023-04-17 ENCOUNTER — Other Ambulatory Visit: Payer: Self-pay | Admitting: Pharmacist

## 2023-04-17 ENCOUNTER — Ambulatory Visit: Admitting: Family Medicine

## 2023-04-17 VITALS — BP 110/60 | HR 84 | Temp 97.7°F | Ht 71.0 in | Wt 291.0 lb

## 2023-04-17 DIAGNOSIS — E1169 Type 2 diabetes mellitus with other specified complication: Secondary | ICD-10-CM | POA: Diagnosis not present

## 2023-04-17 DIAGNOSIS — I25708 Atherosclerosis of coronary artery bypass graft(s), unspecified, with other forms of angina pectoris: Secondary | ICD-10-CM

## 2023-04-17 DIAGNOSIS — I10 Essential (primary) hypertension: Secondary | ICD-10-CM

## 2023-04-17 DIAGNOSIS — M1990 Unspecified osteoarthritis, unspecified site: Secondary | ICD-10-CM

## 2023-04-17 DIAGNOSIS — Z794 Long term (current) use of insulin: Secondary | ICD-10-CM

## 2023-04-17 MED ORDER — NITROGLYCERIN 0.4 MG SL SUBL: 0.4000 mg | SUBLINGUAL_TABLET | SUBLINGUAL | 3 refills | Status: DC | PRN

## 2023-04-17 MED ORDER — FAMOTIDINE 20 MG PO TABS
20.0000 mg | ORAL_TABLET | Freq: Every day | ORAL | 3 refills | Status: DC
Start: 2023-04-17 — End: 2023-04-20

## 2023-04-17 MED ORDER — PREDNISONE 50 MG PO TABS: ORAL_TABLET | ORAL | 0 refills | Status: DC

## 2023-04-17 NOTE — Assessment & Plan Note (Signed)
 Blood pressure at goal today on regimen per cardiology with Metropol tartrate 12.5 mg twice daily and spironolactone 12.5 mg daily.

## 2023-04-17 NOTE — Assessment & Plan Note (Signed)
 Left knee pain consistent with flareup of his osteoarthritis.  No red flag signs or symptoms.  Cannot take NSAIDs due to being anticoagulated.  Will give prednisone burst.  Advised him to follow-up with sports medicine early next week.  Also recommended ice and compression.

## 2023-04-17 NOTE — Assessment & Plan Note (Signed)
 Saw endocrinology yesterday.  A1c has been at goal.  Do not think he will have any issue with prednisone burst as above.  HE has done well for this in the past as well.

## 2023-04-17 NOTE — Progress Notes (Signed)
   [name] [name] is a 68 y.o. male who presents today for an office visit.  Assessment/Plan:  Chronic Problems Addressed Today: Osteoarthritis Left knee pain consistent with flareup of his osteoarthritis.  No red flag signs or symptoms.  Cannot take NSAIDs due to being anticoagulated.  Will give prednisone burst.  Advised him to follow-up with sports medicine early next week.  Also recommended ice and compression.  HTN (hypertension) Blood pressure at goal today on regimen per cardiology with Metropol tartrate 12.5 mg twice daily and spironolactone 12.5 mg daily.  Type 2 diabetes mellitus with other specified complication (HCC), followed by Dr. Anne Ng endocrinology yesterday.  A1c has been at goal.  Do not think he will have any issue with prednisone burst as above.  HE has done well for this in the past as well.     Subjective:  HPI:  Patient here with left knee pain. Flared up about 5 days ago. No obvious injuries or precipitating events. Tried voltaren and tylenol without much improvement. He has had some swelling. Getting a little better. He last saw sports medicine about 4 months ago. Had steroid shot at that time which helped.  He has known history of osteoarthritis in both of his knees.  No fevers or chills.  No redness to the area.  Mild swelling.   See A/P for status of chronic conditions.       Objective:  Physical Exam: BP 110/60 (BP Location: Right Arm, Patient Position: Sitting, Cuff Size: Large)   Pulse 84   Temp 97.7 F (36.5 C) (Temporal)   Ht 5\' 11"  (1.803 m)   Wt 291 lb (132 kg)   SpO2 94%   BMI 40.59 kg/m   Gen: No acute distress, resting comfortably MUSCULOSKELETAL - Left Leg: No deformities.  Knee with tenderness to palpation along medial joint line.  Mild effusion noted.  Limited range of motion due to pain.  No redness or warmth noted.  Neurovascular intact distally. Neuro: Grossly normal, moves all extremities Psych: Normal affect and thought  content      Alverna Fawley M. [name], MD 04/17/2023 10:57 AM

## 2023-04-17 NOTE — Telephone Encounter (Signed)
 Attempt was made to contact patient by phone today for follow up by Clinical Pharmacist regarding diabetes.  Unable to reach patient. LM on VM with my contact number [phone].

## 2023-04-17 NOTE — Patient Instructions (Signed)
 It was very nice to see you today!  I think the arthritis is flared up in your knee.  Please try the prednisone.  Please try using compression to the area.  Also use ice as tolerated.  Return if symptoms worsen or fail to improve.   Take care, Dr [name]  PLEASE NOTE:  If you had any lab tests, please let us know if you have not heard back within a few days. You may see your results on mychart before we have a chance to review them but we will give you a call once they are reviewed by [place].   If we ordered any referrals today, please let us know if you have not heard from their office within the next week.   If you had any urgent prescriptions sent in today, please check with the pharmacy within an hour of our visit to make sure the prescription was transmitted appropriately.   Please try these tips to maintain a healthy lifestyle:  Eat at least 3 REAL meals and 1-2 snacks per day.  Aim for no more than 5 hours between eating.  If you eat breakfast, please do so within one hour of getting up.   Each meal should contain half fruits/vegetables, one quarter protein, and one quarter carbs (no bigger than a computer mouse)  Cut down on sweet beverages. This includes juice, soda, and sweet tea.   Drink at least 1 glass of water with each meal and aim for at least 8 glasses per day  Exercise at least 150 minutes every week.

## 2023-04-20 ENCOUNTER — Other Ambulatory Visit: Payer: Self-pay

## 2023-04-20 DIAGNOSIS — E782 Mixed hyperlipidemia: Secondary | ICD-10-CM

## 2023-04-20 MED ORDER — POTASSIUM CHLORIDE CRYS ER 20 MEQ PO TBCR: 40.0000 meq | EXTENDED_RELEASE_TABLET | Freq: Two times a day (BID) | ORAL | 2 refills | Status: DC

## 2023-04-20 MED ORDER — TORSEMIDE 20 MG PO TABS: 20.0000 mg | ORAL_TABLET | Freq: Two times a day (BID) | ORAL | 2 refills | Status: DC

## 2023-04-20 MED ORDER — FAMOTIDINE 20 MG PO TABS: 20.0000 mg | ORAL_TABLET | Freq: Every day | ORAL | 2 refills | Status: DC

## 2023-04-20 MED ORDER — ROSUVASTATIN CALCIUM 20 MG PO TABS: 20.0000 mg | ORAL_TABLET | Freq: Every evening | ORAL | 1 refills | Status: DC

## 2023-04-20 MED ORDER — SPIRONOLACTONE 25 MG PO TABS: 25.0000 mg | ORAL_TABLET | Freq: Every day | ORAL | 2 refills | Status: DC

## 2023-04-22 ENCOUNTER — Other Ambulatory Visit (HOSPITAL_COMMUNITY): Payer: Self-pay | Admitting: Cardiology

## 2023-04-22 ENCOUNTER — Other Ambulatory Visit: Payer: Self-pay | Admitting: Family Medicine

## 2023-04-23 ENCOUNTER — Encounter: Payer: Self-pay | Admitting: Family Medicine

## 2023-04-25 NOTE — Progress Notes (Deleted)
 HPI  male former smoker followed for OSA, chronic bronchitis, SAR, CAD/MI/CABG/ CHF, HBP, DM, cirrhosis liver, Continues oxygen 2 L for sleep/ [employer]. PFT 06/18/2011-moderate restriction. Flows are normal for measure volume with FEV1/FVC 0.84 and insignificant response to bronchodilator. DLCO 52%. HST-08/05/2016-AHI 37.5/hour, desaturation to 67%, body weight 294 pounds HST 09/08/18- AHI 32.4/ hr, desaturation to 73%- avg 89%, body weight 298 lbs -------------------------------------------------------------------------------  01/27/23-  68 year old male former smoker followed for OSA, chronic bronchitis, SAR, CAD/MI/CABG, dCHF, DM2, Tricuspid Regurgitation, PVD, hepatic cirrhosis, Morbid Obesity,  HST 09/08/18- AHI 32.4/ hr, desaturation to 73%- avg 89%, body weight 298 lbs CPAP auto 5-20/ [employer]  Ordered 11/01/2018   Not using    Download- compliance  Body weight today-281 lbs He says he feels well and denies dyspnea walking. Little cough or wheeze. Needs refills. Discussed OSA management. Wife confirms he snores. He hasn't been using CPAP- complains of mask fit. Discussed Inspire- I think he is too heavy now. Agrees to update sleep study to requalify. CT chest 04/18/22 IMPRESSION: 1. No acute intrathoracic process. 2. Stable cardiomegaly, with prominent biatrial dilatation. 3.  Aortic Atherosclerosis (ICD10-I70.0).  04/27/23- 68 year old male former smoker followed for OSA, chronic bronchitis, SAR, CAD/MI/CABG, dCHF, DM2, Tricuspid Regurgitation, PVD, hepatic cirrhosis, Morbid Obesity,  HST 09/08/18- AHI 32.4/ hr, desaturation to 73%- avg 89%, body weight 298 lbs CPAP auto 5-20/ [employer]  Ordered 11/01/2018   Not using    HST scheduled at last visit- not done? Download- compliance  Body weight today-  ROS-see HPI   + = positive Constitutional:   No-   weight loss, night sweats, fevers, chills, + fatigue, lassitude. HEENT:   No-  headaches, difficulty swallowing, tooth/dental problems, sore  throat,       No-  sneezing, itching, ear ache, nasal congestion, post nasal drip,  CV:  No-   chest pain, orthopnea, PND, + swelling in lower extremities, anasarca,  dizziness, palpitations Resp: + Shortness of breath with exertion or at rest.              No-   productive cough,  No non-productive cough,  No- coughing up of blood.              No-   change in color of mucus.  + Occasional wheezing.   Skin: No-   rash or lesions. GI:  No-   heartburn, indigestion, abdominal pain, nausea, vomiting,  GU: . MS:  No-   joint pain or swelling.   Neuro-     nothing unusual Psych:  No- change in mood or affect. No depression or anxiety.  No memory loss.   OBJ- Physical Exam General- Alert, Oriented, Affect-appropriate, Distress- none acute, +obese,  Skin- +stasis changes legs, + Increased skin turgor? Edema Lymphadenopathy- none Head- atraumatic            Eyes- Gross vision intact, PERRLA, conjunctivae and secretions clear            Ears- Hearing, canals-normal            Nose- Clear, no-Septal dev, mucus, polyps, erosion, perforation             Throat- Mallampati IV , mucosa clear , drainage- none, tonsils- atrophic, +missing teeth Neck- flexible , trachea midline, no stridor , thyroid nl, carotid no bruit Chest - symmetrical excursion , unlabored           Heart/CV- RRR , no murmur , no gallop  , no rub, nl s1 s2                           -  JVD- none , edema- none, stasis changes- none, varices- none           Lung- clear to P&A, wheeze- none, cough- none , dullness-none, rub- none           Chest wall- sternotomy scar Abd-  Br/ Gen/ Rectal- Not done, not indicated Extrem- cyanosis- none, clubbing, none, atrophy- none, strength- nl. Vascular graft donor site scar left forearm. + Cane, + chronic brawny peripheral edema w stasis changes in calves. Neuro- grossly intact to observation

## 2023-04-27 ENCOUNTER — Encounter: Payer: Self-pay | Admitting: Internal Medicine

## 2023-04-27 ENCOUNTER — Ambulatory Visit: Payer: 59 | Admitting: Internal Medicine

## 2023-04-27 ENCOUNTER — Telehealth: Payer: Self-pay | Admitting: Pharmacist

## 2023-04-27 NOTE — Telephone Encounter (Signed)
 Called patient to discuss blood glucose readings. We received letter with most recent A1c from his endocrinology office - Dr Sharl Ma with [facility].  His A1c was 12% 04/16/2023 which is an increase compared to 8.4% 10/2022.  Patient states he has not discussed with Dr Sharl Ma yet, he was planning to call them today to get results.  He is taking insulin glargine 22 units daily and insulin lispro per sliding scale 18 units _ correction based on blood glucose.  Patient tried Continuous Glucose Monitor several years ago but had difficulty getting sensor to stick.    Encouraged patient to contact endo to review labs and discuss any recommended medication changes.  Also discussed newer Continuous Glucose sensors that are smaller and might stick better than in the past.  Patient considering retrial of Continuous Glucose Monitor. He will call me after he talks with Dr Sharl Ma.

## 2023-05-01 ENCOUNTER — Encounter: Payer: Self-pay | Admitting: *Deleted

## 2023-05-02 ENCOUNTER — Other Ambulatory Visit: Payer: Self-pay | Admitting: Cardiology

## 2023-05-02 DIAGNOSIS — I10 Essential (primary) hypertension: Secondary | ICD-10-CM

## 2023-05-06 ENCOUNTER — Telehealth (HOSPITAL_COMMUNITY): Payer: Self-pay | Admitting: Vascular Surgery

## 2023-05-06 NOTE — Telephone Encounter (Signed)
 Lvm giving f/u appt, asked pt to confirm 4/14 @ 140 w/ Adi

## 2023-05-07 ENCOUNTER — Other Ambulatory Visit: Payer: Self-pay | Admitting: *Deleted

## 2023-05-07 DIAGNOSIS — J45998 Other asthma: Secondary | ICD-10-CM | POA: Diagnosis not present

## 2023-05-07 MED ORDER — METOPROLOL TARTRATE 25 MG PO TABS: 12.5000 mg | ORAL_TABLET | Freq: Two times a day (BID) | ORAL | 1 refills | Status: DC

## 2023-05-13 ENCOUNTER — Ambulatory Visit: Payer: Self-pay

## 2023-05-13 ENCOUNTER — Other Ambulatory Visit: Payer: Self-pay

## 2023-05-14 NOTE — Patient Outreach (Signed)
  Chronic Care Management   CCM RN Visit Note  05/14/2023 Name: [name] [name] MRN: [id number] DOB: [birth year]  Subjective: [name] [name] is a 68 y.o. year old male who is a primary care patient of [name], MD. The patient was referred to the Chronic Care Management team for assistance with care management needs subsequent to provider initiation of CCM services and plan of care.    Today's Visit:  Engaged with patient by telephone for follow up visit.        Goals Addressed             This Visit's Progress    Heart Failure and Diabetes Management   On track    Care Coordination Interventions: Advised patient to weigh each morning after emptying bladder Discussed importance of daily weight and advised patient to weigh and record daily Educated on calling Cardiology for weight gains Reviewed medications with patient and discussed importance of medication adherence Discussed plans with patient for ongoing care management follow up and provided patient with direct contact information for care management team Reviewed scheduled/upcoming provider appointments including: 4/14 Cardio, 5/27 PCP Discussed following insulin sliding scale as ordered by Endocrinologist due to increase in A1C 12.0 04/16/23 (previous on 05/25/22 was 8.4)    Patient Goals/Self Care Activities: -Patient/Caregiver will take medications as prescribed   -Patient/Caregiver will attend all scheduled provider appointments -Patient/Caregiver will call provider office for new concerns or questions   -Follows a low sodium diet/DASH diet -Weigh daily and record (notify MD with 3 lb weight gain over night or 5 lb in a week) -Follow CHF Action Plan -Adhere to low sodium diet  *Use an ice pack or frozen bag of vegetables in painful areas. Leave the cold pack on for less than 20 minutes. Ice can burn your skin.   *patient will attend all scheduled medical appointments: 4/14 Cardio, 5/27 PCP. Patient will call Ortho  office for appointment if knee pain persists.          Monitor and Manage My Blood Sugar-Diabetes Type 2   On track       Plan:Telephone follow up appointment with care management team member scheduled for:  Friday, April 4    [facility] Rey Fors BSN, CCM [place]  [facility] Population Health RN Care Manager Direct Dial: [phone]  Fax: [phone]

## 2023-05-14 NOTE — Patient Instructions (Addendum)
 Visit Information  Thank you for taking time to visit with me today. Please don't hesitate to contact me if I can be of assistance to you before our next scheduled appointment.   Please review the enclosed information on Diabetes Diet - the Plate method is very helpful when planning meals to cut down on carbs/sugars.    Our next appointment is by telephone in one month on May 7th  at 10am.    Please call the care guide team at [phone] if you need to cancel or reschedule your appointment.   Please call the Suicide and Crisis Lifeline: 988 if you are experiencing a Mental Health or Behavioral Health Crisis or need someone to talk to.  Jeani Hawking BSN, CCM [place]  VBCI Population Health RN Care Manager Direct Dial: [phone]  Fax: [phone]

## 2023-05-21 ENCOUNTER — Other Ambulatory Visit (HOSPITAL_COMMUNITY): Payer: Self-pay | Admitting: Cardiology

## 2023-05-21 ENCOUNTER — Other Ambulatory Visit: Payer: Self-pay | Admitting: Cardiology

## 2023-05-25 ENCOUNTER — Encounter (HOSPITAL_COMMUNITY): Admitting: Cardiology

## 2023-05-28 NOTE — Progress Notes (Addendum)
 ADVANCED HEART FAILURE CLINIC NOTE  Referring Physician: Kennyth Worth HERO, MD  Primary Care: [name] Worth HERO, MD Primary Cardiologist: Dr. Jeffrie  CC: Severe tricuspid regurgitation  HPI: [name] [name] is a 68 y.o. male with coronary artery disease, ectopic atrial tachycardia, uncontrolled hypertension, hyperlipidemia, obstructive sleep apnea, history of pulmonary embolism and severe RV enlargement with severe tricuspid regurgitation presenting today to establish care.  He was admitted in 3/24 with acute hypoxic respiratory failure in the setting of RV failure, strep bacteremia and new onset severe tricuspid regurgitation with a VQ scan suggestive for pulmonary embolism.  Interval hx:  - Reports that his dyspnea has improved significantly since discharge from the hospital. Today he walked from the emergency department parking lot to our clinic without significant dyspnea or limitiations. He uses a rolling walker for support.  - Currently taking torsemide  20mg  BID with 10mg  at lunch when he feels short of breath.    PHYSICAL EXAM: Vitals:   05/29/23 1118  BP: (!) 94/58  Pulse: 85  SpO2: 95%   GENERAL: NAD Lungs- CTA CARDIAC:  JVP: 10 cm          Normal rate with regular rhythm. NO murmur.  Pulses 2+. 1+ edema.  ABDOMEN: Soft, non-tender, non-distended.  EXTREMITIES: Warm and well perfused.  NEUROLOGIC: No obvious FND   DATA REVIEW  ECG: Normal sinus rhythm  As per my personal interpretation  ECHO: 10/07/22: LVEF 55-60%, severely dilated RV, severely dilated RA, severe TR As per my personal interpretation  CATH: 01/09/23:  HEMODYNAMICS: RA:                  6 mmHg with large V waves RV:                  40/7 mmHg PA:                  41/18 mmHg (22 mean) PCWP:            10 mmHg (mean)                                      Estimated Fick CO/CI   8.3 L/min, 3.4 L/min/m2 Thermodilution CO/CI   6.6 L/min, 2.7 L/min/m2                                              TPG                  12  mmHg                                            PVR                 1.4-1.8 Wood Units  PAPi                3.8       IMPRESSION: Normal pre and post capillary filling pressures Normal PVR Normal cardiac output & index Large V waves in RA consistent with severe TR   ASSESSMENT & PLAN:  Severe tricuspid regurgitation / RV failure - TTE dating back to 05/07/2017 with mild TR; normal  leaflet coaptation.  - Admitted in 04/2022 w/ hypoxic respiratory failure with V/Q scan + for PE; blood cx with strep pyogenous. TTE during this time with RV failure & now severe tricuspid regurgitation with no signs of endocarditis effect the TV by TEE. I suspect his TR is secondary to poor cooaptation from acute dilation of the RV. Will repeat TTE before next follow up. His PVR was only 1.4-1.8 wood units. Will discuss case with Dr. Thompson for triclip.  - spironolactone  25mg  daily  - currently on torsemide  20mg  BID; repeat BMP/BNP today. Will plan on increasing to 40mg  in the morning with 20 at night for the next 3 days.  - Repeat echocardiogram before next follow up.  - ReDs reading: 37 %, abnormal  Ectopic atrial tachycardia - EKG today with normal sinus rhythm - metoprolol  tartrate 12.5mg  BID  Coronary artery disease status post CABG -  CABG x 4 in 2005; will need to obtain records.  - continue ASA 81mg  daily & crestor  20mg  daily.  - no chest pain.   Pulmonary embolism - Diagnosed by V/Q scan in 04/2022; lower extremity dopplers negative.  - continue apixaban  5mg  BID for time being; will plan to discontinue.   Hypertension - Previously unable to tolerate GDMT; spironolactone  25mg  daily only.   Hyperlipidemia - crestor  20mg  daily   Chronic kidney disease - Repeat BMP today. sCr down to 1.19 in 11/24. Will repeat today   Obstructive sleep apnea -waiting on CPAP  type 2 diabetes - Followed by his PCP  I spent 45 minutes caring for this patient today including face to face  time, ordering and reviewing labs, reviewing records noted aobve, seeing the patient, documenting in the record, and arranging follow ups.   [name] [name] Advanced Heart Failure Mechanical Circulatory Support

## 2023-05-29 ENCOUNTER — Ambulatory Visit (HOSPITAL_COMMUNITY)
Admission: RE | Admit: 2023-05-29 | Discharge: 2023-05-29 | Disposition: A | Discharge status: Home / Self Care | Source: Ambulatory Visit | Attending: Cardiology | Admitting: Cardiology

## 2023-05-29 ENCOUNTER — Encounter (HOSPITAL_COMMUNITY): Payer: Self-pay | Admitting: Cardiology

## 2023-05-29 VITALS — BP 94/58 | HR 85 | Wt 286.0 lb

## 2023-05-29 DIAGNOSIS — E782 Mixed hyperlipidemia: Secondary | ICD-10-CM

## 2023-05-29 DIAGNOSIS — I50812 Chronic right heart failure: Secondary | ICD-10-CM | POA: Diagnosis not present

## 2023-05-29 DIAGNOSIS — I071 Rheumatic tricuspid insufficiency: Secondary | ICD-10-CM | POA: Diagnosis not present

## 2023-05-29 DIAGNOSIS — Z79899 Other long term (current) drug therapy: Secondary | ICD-10-CM | POA: Diagnosis not present

## 2023-05-29 DIAGNOSIS — I2510 Atherosclerotic heart disease of native coronary artery without angina pectoris: Secondary | ICD-10-CM | POA: Diagnosis not present

## 2023-05-29 DIAGNOSIS — Z86711 Personal history of pulmonary embolism: Secondary | ICD-10-CM | POA: Diagnosis not present

## 2023-05-29 DIAGNOSIS — G4733 Obstructive sleep apnea (adult) (pediatric): Secondary | ICD-10-CM | POA: Diagnosis not present

## 2023-05-29 DIAGNOSIS — I2609 Other pulmonary embolism with acute cor pulmonale: Secondary | ICD-10-CM

## 2023-05-29 DIAGNOSIS — Z951 Presence of aortocoronary bypass graft: Secondary | ICD-10-CM | POA: Insufficient documentation

## 2023-05-29 DIAGNOSIS — I130 Hypertensive heart and chronic kidney disease with heart failure and stage 1 through stage 4 chronic kidney disease, or unspecified chronic kidney disease: Secondary | ICD-10-CM | POA: Insufficient documentation

## 2023-05-29 DIAGNOSIS — I25708 Atherosclerosis of coronary artery bypass graft(s), unspecified, with other forms of angina pectoris: Secondary | ICD-10-CM | POA: Diagnosis not present

## 2023-05-29 DIAGNOSIS — E1169 Type 2 diabetes mellitus with other specified complication: Secondary | ICD-10-CM | POA: Diagnosis not present

## 2023-05-29 DIAGNOSIS — E785 Hyperlipidemia, unspecified: Secondary | ICD-10-CM | POA: Insufficient documentation

## 2023-05-29 DIAGNOSIS — I50810 Right heart failure, unspecified: Secondary | ICD-10-CM | POA: Diagnosis not present

## 2023-05-29 DIAGNOSIS — E1122 Type 2 diabetes mellitus with diabetic chronic kidney disease: Secondary | ICD-10-CM | POA: Insufficient documentation

## 2023-05-29 DIAGNOSIS — I4719 Other supraventricular tachycardia: Secondary | ICD-10-CM | POA: Diagnosis not present

## 2023-05-29 LAB — CBC
HCT: 41.5 % (ref 39.0–52.0)
Hemoglobin: 13.9 g/dL (ref 13.0–17.0)
MCH: 32.1 pg (ref 26.0–34.0)
MCHC: 33.5 g/dL (ref 30.0–36.0)
MCV: 95.8 fL (ref 80.0–100.0)
Platelets: 222 10*3/uL (ref 150–400)
RBC: 4.33 MIL/uL (ref 4.22–5.81)
RDW: 13.9 % (ref 11.5–15.5)
WBC: 8.6 10*3/uL (ref 4.0–10.5)
nRBC: 0 % (ref 0.0–0.2)

## 2023-05-29 LAB — BASIC METABOLIC PANEL WITH GFR
Anion gap: 12 (ref 5–15)
BUN: 18 mg/dL (ref 8–23)
CO2: 25 mmol/L (ref 22–32)
Calcium: 8.9 mg/dL (ref 8.9–10.3)
Chloride: 99 mmol/L (ref 98–111)
Creatinine, Ser: 1.3 mg/dL — ABNORMAL HIGH (ref 0.61–1.24)
GFR, Estimated: 59 mL/min — ABNORMAL LOW (ref >60)
Glucose, Bld: 249 mg/dL — ABNORMAL HIGH (ref 70–99)
Potassium: 3.2 mmol/L — ABNORMAL LOW (ref 3.5–5.1)
Sodium: 136 mmol/L (ref 135–145)

## 2023-05-29 LAB — BRAIN NATRIURETIC PEPTIDE: B Natriuretic Peptide: 63.1 pg/mL (ref 0.0–100.0)

## 2023-05-29 NOTE — Patient Instructions (Addendum)
 Medication Changes:  TAKE TORSEMIDE  40MG  IN THE MORNING FOR 3 DAYS WITH 20MG  AT NIGHT   THEN RETURN TO 20MG  TWICE DAILY   Lab Work:  Labs done today, your results will be available in MyChart, we will contact you for abnormal readings.  Follow-Up in: IN 2 MONTHS AS SCHEDULED WITH AN ECHO WITH DR. Bruce Caper   At the [facility] Failure Clinic, you and your health needs are our priority. We have a designated team specialized in the treatment of Heart Failure. This Care Team includes your primary Heart Failure Specialized Cardiologist (physician), Advanced Practice Providers (APPs- Physician Assistants and Nurse Practitioners), and Pharmacist who all work together to provide you with the care you need, when you need it.   You may see any of the following providers on your designated Care Team at your next follow up:  Dr. Jules Oar Dr. Peder Bourdon Dr. Alwin Baars Dr. Judyth Nunnery Nieves Bars, NP Ruddy Corral, [place] [facility] [place], [place] Dennise Fitz, NP [place] Lee, NP Luster Salters, PharmD   Please be sure to bring in all your medications bottles to every appointment.   Need to Contact Us :  If you have any questions or concerns before your next appointment please send us  a message through [place] or call our office at [phone].    TO LEAVE A MESSAGE FOR THE NURSE SELECT OPTION 2, PLEASE LEAVE A MESSAGE INCLUDING: YOUR NAME DATE OF BIRTH CALL BACK NUMBER REASON FOR CALL**this is important as we prioritize the call backs  YOU WILL RECEIVE A CALL BACK THE SAME DAY AS LONG AS YOU CALL BEFORE 4:00 PM

## 2023-05-29 NOTE — Progress Notes (Signed)
 ReDS Vest / Clip - 05/29/23 1200       ReDS Vest / Clip   Station Marker D    Ruler Value 44    ReDS Value Range Moderate volume overload    ReDS Actual Value 37

## 2023-05-30 ENCOUNTER — Other Ambulatory Visit: Payer: Self-pay | Admitting: Family Medicine

## 2023-06-02 MED ORDER — HUMALOG 100 UNIT/ML IJ SOLN: 20.0000 [IU] | Freq: Three times a day (TID) | INTRAMUSCULAR | 1 refills | Status: DC

## 2023-06-03 ENCOUNTER — Ambulatory Visit: Admitting: Sports Medicine

## 2023-06-03 ENCOUNTER — Ambulatory Visit (INDEPENDENT_AMBULATORY_CARE_PROVIDER_SITE_OTHER)

## 2023-06-03 VITALS — HR 89 | Ht 71.0 in | Wt 286.0 lb

## 2023-06-03 DIAGNOSIS — M25562 Pain in left knee: Secondary | ICD-10-CM

## 2023-06-03 DIAGNOSIS — M5030 Other cervical disc degeneration, unspecified cervical region: Secondary | ICD-10-CM

## 2023-06-03 DIAGNOSIS — M542 Cervicalgia: Secondary | ICD-10-CM | POA: Diagnosis not present

## 2023-06-03 DIAGNOSIS — W19XXXA Unspecified fall, initial encounter: Secondary | ICD-10-CM

## 2023-06-03 DIAGNOSIS — G8929 Other chronic pain: Secondary | ICD-10-CM

## 2023-06-03 DIAGNOSIS — M47812 Spondylosis without myelopathy or radiculopathy, cervical region: Secondary | ICD-10-CM | POA: Diagnosis not present

## 2023-06-03 DIAGNOSIS — M1712 Unilateral primary osteoarthritis, left knee: Secondary | ICD-10-CM | POA: Diagnosis not present

## 2023-06-03 MED ORDER — TRAMADOL HCL 50 MG PO TABS: 50.0000 mg | ORAL_TABLET | Freq: Two times a day (BID) | ORAL | 0 refills | Status: AC | PRN

## 2023-06-03 NOTE — Progress Notes (Signed)
 [name] [name] D.[facility] [address] [place] [address] Phone: [phone]   Assessment and Plan:     1. Neck pain 2. DDD (degenerative disc disease), cervical  4. Fall, initial encounter - Chronic with exacerbation, initial visit - New, worsening left-sided pain and left upper extremity radicular symptoms after fall backwards onto grass last week - X-ray obtained at today's visit.  My interpretation: Decreased vertebral height C5, C6, C7.  Cortical changes to right-sided C4-C5 on AP view.  Unclear if these changes are acute or chronic in nature - Based on worsening left-sided radicular symptoms, neck pain after fall, recommend further evaluation with C-spine MRI to evaluate for vertebral compression fracture, disc herniation, neurologic impingement - Start Tylenol  500 to 1000 mg tablets 2-3 times a day for day-to-day pain relief - May use tramadol  50 mg twice daily as needed for severe breakthrough pain. - Do not recommend prescription NSAID or prednisone  use due to past medical history of DM type II and chronic anticoagulation on Eliquis  -Start gentle HEP for neck - If bilateral upper extremity radicular symptoms present, recommend immediate ER evaluation  15 additional minutes spent for educating Therapeutic Home Exercise Program.  This included exercises focusing on stretching, strengthening, with focus on eccentric aspects.   Long term goals include an improvement in range of motion, strength, endurance as well as avoiding reinjury. Patient's frequency would include in 1-2 times a day, 3-5 times a week for a duration of 6-12 weeks. Proper technique shown and discussed handout in great detail with ATC.  All questions were discussed and answered.       3. Primary osteoarthritis of left knee 5. Chronic pain of left knee -Chronic, stable, subsequent visit - Patient is still receiving pain relief from intra-articular CSI performed in December  2024.  He does have intermittent recurrences of intra-articular effusion leading to knee pressure, though no repeat pain at this time.  Without recurrence of pain, I do not recommend aspiration and injection at today's visit.  Could be considered at future visit if pain returns.   Pertinent previous records reviewed include none  Follow Up: 1 week after MRI to review results and discuss treatment plan   Subjective:   I, [name] [name], am serving as a Neurosurgeon for Doctor Ulysees Gander  Chief Complaint: neck pain   HPI:   06/03/23 Patient is a 68 year old male with neck pain. Patient states he fell last week. Neck pain that radiates to his head and ears. Decreased ROM due to pain. Feels like a cramp. Icy hot and tylenol  dont help. Pain does go down to his arm , but hx of neuropathy. Pain does radiate down his back    Relevant Historical Information: DM type II, hypertension, elevated BMI, PVD, chronic anticoagulation on Eliquis    Additional pertinent review of systems negative.   Current Outpatient Medications:    Accu-Chek Softclix Lancets lancets, TEST MORNING, NOON AND BEDTIME, Disp: 100 each, Rfl: 3   albuterol  (ACCUNEB ) 0.63 MG/3ML nebulizer solution, Take 1 ampule by nebulization every 6 (six) hours as needed for wheezing., Disp: , Rfl:    albuterol  (VENTOLIN  HFA) 108 (90 Base) MCG/ACT inhaler, INHALE TWO PUFFS BY MOUTH EVERY SIX HOURS AS NEEDED FOR WHEEZING OR SHORTNESS OF BREATH, Disp: 8.5 g, Rfl: 5   aspirin  EC (ASPIRIN  LOW DOSE) 81 MG tablet, TAKE ONE TABLET BY MOUTH EVERY MORNING, Disp: 90 tablet, Rfl: 2   B-D ULTRAFINE III SHORT PEN 31G  X 8 MM MISC, USE AS DIRECTED, Disp: 100 each, Rfl: 5   Blood Glucose Monitoring Suppl DEVI, 1 each by Does not apply route in the morning, at noon, and at bedtime. May substitute to any manufacturer covered by patient's insurance.Dx E11.69, Disp: 1 each, Rfl: 0   diclofenac  Sodium (VOLTAREN ) 1 % GEL, APPLY 2 GRAMS TOPICALLY 4 TIMES DAILY TO  KNEE, Disp: 100 g, Rfl: 2   ELIQUIS  5 MG TABS tablet, TAKE ONE TABLET BY MOUTH TWICE DAILY (AM+PM), Disp: 60 tablet, Rfl: 1   famotidine  (PEPCID ) 20 MG tablet, Take 1 tablet (20 mg total) by mouth daily., Disp: 90 tablet, Rfl: 2   fluticasone  (FLONASE ) 50 MCG/ACT nasal spray, Place 1 spray into both nostrils daily., Disp: 16 g, Rfl: 12   Fluticasone -Umeclidin-Vilant (TRELEGY ELLIPTA ) 100-62.5-25 MCG/ACT AEPB, INHALE 1 PUFF INTO THE LUNGS DAILY, Disp: 180 each, Rfl: 4   formoterol  (PERFOROMIST ) 20 MCG/2ML nebulizer solution, Take 20 mcg by nebulization every 12 (twelve) hours., Disp: , Rfl:    glucose blood (ONETOUCH ULTRA) test strip, TESTING 8 TIMES DAILY, Disp: 800 strip, Rfl: 1   HUMALOG  100 UNIT/ML injection, Inject 0.2 mLs (20 Units total) into the skin 3 (three) times daily with meals. Sliding scale, Disp: 60 mL, Rfl: 1   hydroxypropyl methylcellulose / hypromellose (ISOPTO TEARS / GONIOVISC) 2.5 % ophthalmic solution, Place 2 drops into both eyes as needed for dry eyes., Disp: 15 mL, Rfl: 12   insulin  glargine (LANTUS ) 100 UNIT/ML injection, Inject 24 Units into the skin daily., Disp: , Rfl:    lidocaine  (LIDODERM ) 5 %, Place 1 patch onto the skin daily. Remove & Discard patch within 12 hours or as directed by MD, Disp: 5 patch, Rfl: 0   linaclotide  (LINZESS ) 290 MCG CAPS capsule, Take 290 mcg by mouth as needed., Disp: , Rfl:    losartan  (COZAAR ) 100 MG tablet, TAKE 1 TABLET (100 MG TOTAL) BY MOUTH DAILY (AM), Disp: 30 tablet, Rfl: 1   MAGNESIUM -OXIDE 400 (241.3 Mg) MG tablet, Take 1 tablet by mouth daily., Disp: , Rfl:    metoprolol  tartrate (LOPRESSOR ) 25 MG tablet, Take 0.5 tablets (12.5 mg total) by mouth 2 (two) times daily., Disp: 48 tablet, Rfl: 1   nitroGLYCERIN  (NITROSTAT ) 0.4 MG SL tablet, Place 1 tablet (0.4 mg total) under the tongue every 5 (five) minutes as needed for chest pain., Disp: 25 tablet, Rfl: 3   potassium chloride  SA (KLOR-CON  M) 20 MEQ tablet, Take 2 tablets (40 mEq  total) by mouth 2 (two) times daily., Disp: 360 tablet, Rfl: 2   rosuvastatin  (CRESTOR ) 20 MG tablet, Take 1 tablet (20 mg total) by mouth every evening., Disp: 30 tablet, Rfl: 1   silver  sulfADIAZINE  (SSD) 1 % cream, APPLY PEA SIZED AMOUNT TOPICALLY TO WOUND DAILY, Disp: 50 g, Rfl: 0   spironolactone  (ALDACTONE ) 25 MG tablet, Take 1 tablet (25 mg total) by mouth daily., Disp: 90 tablet, Rfl: 2   torsemide  (DEMADEX ) 20 MG tablet, Take 1 tablet (20 mg total) by mouth 2 (two) times daily., Disp: 180 tablet, Rfl: 2   Objective:     Vitals:   06/03/23 1614  Pulse: 89  SpO2: 98%  Weight: 286 lb (129.7 kg)  Height: 5\' 11"  (1.803 m)      Body mass index is 39.89 kg/m.    Physical Exam:    Neck Exam: Cervical Spine- Posture normal Skin- normal, intact  Neuro:  Strength-  Right Left   Deltoid (C5) 5/5 5/5  Bicep/Brachioradialis (C5/6) 5/5  5/5  Wrist Extension (C6) 5/5 5/5  Tricep (C7) 5/5 5/5  Wrist Flexion (C7) 5/5 5/5  Grip (C8) 5/5 5/5  Finger Abduction (T1) 5/5 5/5   Sensation: Decreased sensation in the left fingers compared to right the patient described as baseline.  Otherwise, sensation intact to light touch in upper extremities  Spurling's:  negative bilaterally Neck ROM: Full active ROM TTP: Left cervical paraspinal, left trapezius NTTP: cervical spinous processes, right cervical paraspinal, thoracic paraspinal, right trapezius    Electronically signed by:  Marshall Skeeter D.[facility] 4:39 PM 06/03/23

## 2023-06-03 NOTE — Patient Instructions (Addendum)
 MRI [place]  Follow up 1 week after MRI to discuss results If you start having numbness and tingling in both arms go to ER immediately  Tylenol  [phone] mg 2-3 times a day for pain relief  Tramadol  2x daily for severe breakthrough pain . Only if tylenol  is not helping

## 2023-06-08 ENCOUNTER — Encounter (HOSPITAL_COMMUNITY): Payer: Self-pay

## 2023-06-10 ENCOUNTER — Telehealth (HOSPITAL_COMMUNITY): Payer: Self-pay | Admitting: *Deleted

## 2023-06-10 ENCOUNTER — Ambulatory Visit: Payer: Self-pay

## 2023-06-10 NOTE — Telephone Encounter (Signed)
 Noted Patient has an OV with PCP schedule on 06/11/2023

## 2023-06-10 NOTE — Telephone Encounter (Signed)
 Chief Complaint: boil on testicle Symptoms: pain, redness with white spot Frequency: about 3 days Pertinent Negatives: Patient denies fever, Disposition: [] ED /[] Urgent Care (no appt availability in office) / [x] Appointment(In office/virtual)/ []  [place] [facility]/ [] Home Care/ [] Refused Recommended Disposition /[]  Mobile Bus/ []  Follow-up with PCP Additional Notes: Pt states that he has had boils on his testicles in the past. Per his wife the spot is about the size of a penny red, and has a white center. Pt states that it was draining yesterday. Pt states that he is DM2. Pt scheduled for tomorrow as pt requested to see pcp. Copied from CRM [id number]. Topic: Clinical - Red Word Triage >> Jun 10, 2023  8:08 AM Kita Perish H wrote: [facility] that prompted transfer to Nurse Triage: Boil on side of testicles, painful Reason for Disposition  [1] Boil > 1/2 inch across (> 12 mm; larger than a marble) AND [2] center is soft or pus colored  Answer Assessment - Initial Assessment Questions 1. APPEARANCE of BOIL: "What does the boil look like?"      White spot with redness 2. LOCATION: "Where is the boil located?"      One on the testicle 3. NUMBER: "How many boils are there?"      one 4. SIZE: "How big is the boil?" (e.g., inches, cm; compare to size of a coin or other object)     Pt states about the size of a penny 5. ONSET: "When did the boil start?"     Three days 6. PAIN: "Is there any pain?" If Yes, ask: "How bad is the pain?"   (Scale 1-10; or mild, moderate, severe)     Hurts when I close my leg 7. FEVER: "Do you have a fever?" If Yes, ask: "What is it, how was it measured, and when did it start?"      denies 8. SOURCE: "Have you been around anyone with boils or other Staph infections?" "Have you ever had boils before?"     Had boils before in testicle and buttocks 9. OTHER SYMPTOMS: "Do you have any other symptoms?" (e.g., shaking chills, weakness, rash elsewhere on body) Was  draining a little bit yesterday  Protocols used: Boil (Skin Abscess)-A-AH

## 2023-06-10 NOTE — Telephone Encounter (Signed)
 Called patient per Dr. Bruce Caper with following lab results and instructions:  "Can we ask him to take KCL 40meq daily for the next two days. ( Additional to whatever he is currently taking)"  Pt verbalized understanding of same.

## 2023-06-11 ENCOUNTER — Ambulatory Visit (INDEPENDENT_AMBULATORY_CARE_PROVIDER_SITE_OTHER): Admitting: Family Medicine

## 2023-06-11 ENCOUNTER — Encounter: Payer: Self-pay | Admitting: Family Medicine

## 2023-06-11 VITALS — BP 112/69 | HR 80 | Temp 97.0°F | Ht 71.0 in | Wt 296.6 lb

## 2023-06-11 DIAGNOSIS — I10 Essential (primary) hypertension: Secondary | ICD-10-CM

## 2023-06-11 DIAGNOSIS — G5622 Lesion of ulnar nerve, left upper limb: Secondary | ICD-10-CM

## 2023-06-11 DIAGNOSIS — L03314 Cellulitis of groin: Secondary | ICD-10-CM

## 2023-06-11 MED ORDER — DOXYCYCLINE HYCLATE 100 MG PO TABS: 100.0000 mg | ORAL_TABLET | Freq: Two times a day (BID) | ORAL | 0 refills | Status: DC

## 2023-06-11 MED ORDER — TRAMADOL HCL 50 MG PO TABS: 50.0000 mg | ORAL_TABLET | Freq: Three times a day (TID) | ORAL | 0 refills | Status: AC | PRN

## 2023-06-11 NOTE — Assessment & Plan Note (Signed)
 Blood pressure at goal today on metoprolol  tartrate 12.5 mg twice daily and spironolactone  12.5 mg daily.

## 2023-06-11 NOTE — Patient Instructions (Signed)
 It was very nice to see you today!  Please start the antibiotic.  Use the tramadol  as needed.  The area should continue to improve over the next several days.  Please let us  know if you develop any new symptoms or if your symptoms do not continue to improve.  Return if symptoms worsen or fail to improve.   Take care, Dr Daneil Dunker  PLEASE NOTE:  If you had any lab tests, please let us  know if you have not heard back within a few days. You may see your results on mychart before we have a chance to review them but we will give you a call once they are reviewed by us .   If we ordered any referrals today, please let us  know if you have not heard from their office within the next week.   If you had any urgent prescriptions sent in today, please check with the pharmacy within an hour of our visit to make sure the prescription was transmitted appropriately.   Please try these tips to maintain a healthy lifestyle:  Eat at least 3 REAL meals and 1-2 snacks per day.  Aim for no more than 5 hours between eating.  If you eat breakfast, please do so within one hour of getting up.   Each meal should contain half fruits/vegetables, one quarter protein, and one quarter carbs (no bigger than a computer mouse)  Cut down on sweet beverages. This includes juice, soda, and sweet tea.   Drink at least 1 glass of water with each meal and aim for at least 8 glasses per day  Exercise at least 150 minutes every week.

## 2023-06-11 NOTE — Assessment & Plan Note (Signed)
 Following with sports medicine.  Has upcoming MRI to look for cervical impingement.  He was previously on Lyrica  however discontinued this a year or 2 ago.  He would like to avoid gabapentin .  We did discuss restarting Lyrica  however he deferred for today.

## 2023-06-11 NOTE — Progress Notes (Signed)
   [name] [name] is a 68 y.o. male who presents today for an office visit.  Assessment/Plan:  New/Acute Problems: Left groin cellulitis No red flags.  No signs of systemic illness.  Exam today consistent with cellulitis in the left inguinal crease.  Does have a small area that is draining purulent drainage however no other areas amenable to I&D.  No signs or symptoms of Fournier gangrene or deep abscess.  He has had similar abscesses in the past however this was several years ago most recently.  We will start doxycycline .  We discussed wound care.  Will give small supply of tramadol  to use as needed for pain control.  He will let us  know if not improving in the next several days.  We discussed reasons to return to care or seek emergent care.  Chronic Problems Addressed Today: HTN (hypertension) Blood pressure at goal today on metoprolol  tartrate 12.5 mg twice daily and spironolactone  12.5 mg daily.  Ulnar neuritis, left Following with sports medicine.  Has upcoming MRI to look for cervical impingement.  He was previously on Lyrica  however discontinued this a year or 2 ago.  He would like to avoid gabapentin .  We did discuss restarting Lyrica  however he deferred for today.     Subjective:  HPI:  See A/P for status of chronic conditions.  Patient is here today with concern for abscess in groin. This started about aweek go. Feels like previous infections. He has tried using a hot compress on the area. He is getting some draining from the area. No fevers or chills. His last abscess was about 6 or 7 years ago.        Objective:  Physical Exam: BP 112/69   Pulse 80   Temp (!) 97 F (36.1 C) (Temporal)   Ht 5\' 11"  (1.803 m)   Wt 296 lb 9.6 oz (134.5 kg)   SpO2 95%   BMI 41.37 kg/m   Gen: No acute distress, resting comfortably GU: Inflamed indurated area in left inguinal crease approximately 5 x 6 cm with purulent drainage present. Neuro: Grossly normal, moves all extremities Psych:  Normal affect and thought content      Halee Glynn M. Daneil Dunker, MD 06/11/2023 12:55 PM

## 2023-06-12 ENCOUNTER — Encounter: Payer: Self-pay | Admitting: Sports Medicine

## 2023-06-16 ENCOUNTER — Other Ambulatory Visit: Payer: Self-pay | Admitting: Cardiology

## 2023-06-17 ENCOUNTER — Other Ambulatory Visit

## 2023-06-18 ENCOUNTER — Other Ambulatory Visit

## 2023-06-18 ENCOUNTER — Telehealth: Payer: Self-pay | Admitting: Cardiology

## 2023-06-18 DIAGNOSIS — J45998 Other asthma: Secondary | ICD-10-CM | POA: Diagnosis not present

## 2023-06-18 MED ORDER — TORSEMIDE 20 MG PO TABS: 20.0000 mg | ORAL_TABLET | Freq: Two times a day (BID) | ORAL | 1 refills | Status: DC

## 2023-06-18 NOTE — Telephone Encounter (Signed)
*  STAT* If patient is at the pharmacy, call can be transferred to refill team.   1. Which medications need to be refilled? (please list name of each medication and dose if known)   torsemide  (DEMADEX ) 20 MG tablet     2. Would you like to learn more about the convenience, safety, & potential cost savings by using the [facility]? No   3. Are you open to using the [facility] (Type [facility].) No   4. Which pharmacy/location (including street and city if local pharmacy) is medication to be sent to? [facility] [phone] - [place], [place] - [address]    5. Do they need a 30 day or 90 day supply? 90 day   Previous request was sent to wrong pharmacy

## 2023-06-18 NOTE — Telephone Encounter (Signed)
 Pt's medication was sent to pt's pharmacy as requested. Confirmation received.

## 2023-06-19 ENCOUNTER — Other Ambulatory Visit

## 2023-06-22 ENCOUNTER — Telehealth: Payer: Self-pay | Admitting: *Deleted

## 2023-06-22 NOTE — Telephone Encounter (Signed)
 Spoke with patient, aware need to contact Hugh Madura, MD for refills  Patient stated got his refills already

## 2023-06-22 NOTE — Telephone Encounter (Signed)
 Copied from CRM [id number]. Topic: Clinical - Prescription Issue >> Jun 18, 2023  3:55 PM Adaysia C wrote: Reason for CRM: Patient called to inform PCP that his pharmacy is not releasing the RX refill for torsemide  (DEMADEX ) 20 MG tablet; please follow up with patients pharmacy to approve the release of the medication; patient is also out of the medication [id number] [place], [place] - [address] AT [facility] [address], [place] [address] Phone: [phone]  Fax: [phone]   Please follow up with patient after speaking with patients pharmacy [phone]    Left message to return call to our office at their convenience.  Arad Burston,RMA

## 2023-06-22 NOTE — Telephone Encounter (Signed)
 Patient returned call. Requests to be called at ph# [phone]

## 2023-06-23 ENCOUNTER — Other Ambulatory Visit: Payer: Self-pay | Admitting: Family Medicine

## 2023-06-23 DIAGNOSIS — E782 Mixed hyperlipidemia: Secondary | ICD-10-CM

## 2023-07-07 ENCOUNTER — Encounter: Payer: 59 | Admitting: Family Medicine

## 2023-07-08 ENCOUNTER — Telehealth: Payer: Self-pay | Admitting: *Deleted

## 2023-07-08 NOTE — Telephone Encounter (Signed)
 [name] [name]

## 2023-07-08 NOTE — Telephone Encounter (Signed)
 Copied from CRM [id number]. Topic: Clinical - Medication Question >> Jul 08, 2023  9:03 AM Kita Perish H wrote: Reason for CRM: Patient following up with Dr. Daneil Dunker regarding some medication Dr. Daneil Dunker was supposed to call in for the nerves in his hands. Patient not sure of medication name.  [place] [phone]  Please advise  Horald Lyme

## 2023-07-09 NOTE — Telephone Encounter (Signed)
 Spoke with patient, stated he will like to restart Rx  Lyrica 

## 2023-07-09 NOTE — Telephone Encounter (Signed)
 Can we clarify? Was this supposed to go to sports medicine?

## 2023-07-10 MED ORDER — PREGABALIN 25 MG PO CAPS: 25.0000 mg | ORAL_CAPSULE | Freq: Two times a day (BID) | ORAL | 5 refills | Status: AC

## 2023-07-10 NOTE — Telephone Encounter (Signed)
 Prescription sent in.I would like for him to follow up with us  in a couple weeks.  Jinny Mounts. Daneil Dunker, MD 07/10/2023 12:46 PM

## 2023-07-14 ENCOUNTER — Encounter: Payer: Self-pay | Admitting: Podiatry

## 2023-07-14 ENCOUNTER — Ambulatory Visit (INDEPENDENT_AMBULATORY_CARE_PROVIDER_SITE_OTHER): Admitting: Podiatry

## 2023-07-14 DIAGNOSIS — M79672 Pain in left foot: Secondary | ICD-10-CM | POA: Diagnosis not present

## 2023-07-14 DIAGNOSIS — B351 Tinea unguium: Secondary | ICD-10-CM

## 2023-07-14 DIAGNOSIS — E1151 Type 2 diabetes mellitus with diabetic peripheral angiopathy without gangrene: Secondary | ICD-10-CM | POA: Diagnosis not present

## 2023-07-14 DIAGNOSIS — I70209 Unspecified atherosclerosis of native arteries of extremities, unspecified extremity: Secondary | ICD-10-CM

## 2023-07-14 DIAGNOSIS — M79671 Pain in right foot: Secondary | ICD-10-CM | POA: Diagnosis not present

## 2023-07-14 NOTE — Progress Notes (Signed)
 Patient presents for evaluation and treatment of tenderness and some redness around nails feet.  Tenderness around toes with walking and wearing shoes.  Physical exam:  General appearance: Alert, pleasant, and in no acute distress.  Vascular: Pedal pulses: DP 2/4, PT 0/4.  Severe edema lower legs bilaterally  Neurological:  Does not complain of paresthesias or burning.  Dermatologic:  Nails thickened, disfigured, discolored 1-5 BL with subungual debris.  Redness and hypertrophic nail folds along nail folds bilaterally but no signs of drainage or infection.  Musculoskeletal:  Hammertoes 2 through 5 bilaterally   Diagnosis: 1. Painful onychomycotic nails 1 through 5 bilaterally. 2. Pain toes 1 through 5 bilaterally. 3.  Diabetes mellitus type 2 with PVD  Plan: Debrided onychomycotic nails 1 through 5 bilaterally.  Return 3 months

## 2023-07-17 ENCOUNTER — Other Ambulatory Visit: Payer: Self-pay

## 2023-07-19 NOTE — Patient Instructions (Signed)
 Visit Information  Thank you for taking time to visit with me today. Please don't hesitate to contact me if I can be of assistance to you before our next scheduled telephone appointment.  Our next appointment is by telephone on 6/27 at 10am.  Following is a copy of your care plan:   Goals Addressed             This Visit's Progress    VBCI RN Care Plan       Problems:  Chronic Disease Management support and education needs related to CHF and DMII  Goal: Over the next 5 months the Patient will continue to work with RN Care Manager and/or Social Worker to address care management and care coordination needs related to CHF and DMII as evidenced by adherence to care management team scheduled appointments      Interventions:   Heart Failure Interventions: Provided education on low sodium diet Advised patient to weigh each morning after emptying bladder Discussed importance of daily weight and advised patient to weigh and record daily Reviewed role of diuretics in prevention of fluid overload and management of heart failure; Discussed the importance of keeping all appointments with provider Provided patient with education about the role of exercise in the management of heart failure Screening for signs and symptoms of depression related to chronic disease state  Assessed social determinant of health barriers   Diabetes Interventions: Assessed patient's understanding of A1c goal: <7% Provided education to patient about basic DM disease process Reviewed medications with patient and discussed importance of medication adherence Counseled on importance of regular laboratory monitoring as prescribed Discussed plans with patient for ongoing care management follow up and provided patient with direct contact information for care management team Review of patient status, including review of consultants reports, relevant laboratory and other test results, and medications completed Lab Results   Component Value Date   HGBA1C 12 04/16/2023    Patient Self-Care Activities:  Attend all scheduled provider appointments Call pharmacy for medication refills 3-7 days in advance of running out of medications Call provider office for new concerns or questions  Perform all self care activities independently  Take medications as prescribed   Work with the pharmacist to address medication management needs and will continue to work with the clinical team to address health care and disease management related needs call office if I gain more than 2 pounds in one day or 5 pounds in one week do ankle pumps when sitting keep legs up while sitting track weight in diary use salt in moderation watch for swelling in feet, ankles and legs every day begin a heart failure diary follow rescue plan if symptoms flare-up eat more whole grains, fruits and vegetables, lean meats and healthy fats track symptoms and what helps feel better or worse dress right for the weather, hot or cold schedule appointment with eye doctor check feet daily for cuts, sores or redness enter blood sugar readings and medication or insulin  into daily log set goal weight trim toenails straight across drink 6 to 8 glasses of water each day eat fish at least once per week fill half of plate with vegetables limit fast food meals to no more than 1 per week manage portion size prepare main meal at home 3 to 5 days each week set a realistic goal switch to low-fat or skim milk switch to sugar-free drinks wear comfortable, cotton socks wear comfortable, well-fitting shoes  Plan:  The patient has been provided with contact information  for the care management team and has been advised to call with any health related questions or concerns.              Patient verbalizes understanding of instructions and care plan provided today and agrees to view in MyChart. Active MyChart status and patient understanding of how to  access instructions and care plan via MyChart confirmed with patient.     The patient has been provided with contact information for the care management team and has been advised to call with any health related questions or concerns.   Please call the care guide team at [phone] if you need to cancel or reschedule your appointment.   Please call 1-800-273-TALK (toll free, 24 hour hotline) if you are experiencing a Mental Health or Behavioral Health Crisis or need someone to talk to.  Zamoria Boss A. Saverio Curling RN, BA, [facility], CRRN [place]  [facility] Population Health RN Care Manager Direct Dial: [phone]  Fax: [phone]

## 2023-07-20 NOTE — Patient Outreach (Signed)
 Complex Care Management   Visit Note  07/17/2023  Name:  [name] [name] MRN: [id number] DOB: [birth year]  Situation: Referral received for Complex Care Management related to Heart Failure and Diabetes with Complications I obtained verbal consent from Patient.  Visit completed with Patient  on the phone  Background:   Past Medical History:  Diagnosis Date   Acute myocardial infarction, unspecified site, episode of care unspecified 2005   Acute pancreatitis    CAD (coronary artery disease)    a. CABG in 2005 w LIMA to LAD, left radial to second circumflex marginal, saphenous vein graft to PDA, saphenous vein graft to lateral subbrach of ramus intermediate, and sequential saphenous vein graft to the medial subbranch of ramus intermediate.   Chronic diastolic CHF (congestive heart failure) (HCC)    Cirrhosis of liver without mention of alcohol    CKD (chronic kidney disease), stage II    Complications affecting other specified body systems, hypertension    Ectopic atrial rhythm    Essential hypertension    Hyperlipidemia    Hypokalemia    a. intermitttent noncompliance with potassium supplement.   Hypomagnesemia    Morbid obesity (HCC)    Neuropathy    Other and unspecified hyperlipidemia    Proteinuria    RBBB    Sleep apnea    Type II or unspecified type diabetes mellitus without mention of complication, not stated as uncontrolled    Varicose veins of both lower extremities     Assessment: Patient Reported Symptoms:  Cognitive Cognitive Status: Alert and oriented to person, place, and time, Insightful and able to interpret abstract concepts, Normal speech and language skills Cognitive/Intellectual Conditions Management [RPT]: None reported or documented in medical history or problem list   Health Maintenance Behaviors: Annual physical exam, Healthy diet Healing Pattern: Unsure Health Facilitated by: Healthy diet  Neurological Neurological Review of Symptoms: No symptoms  reported Neurological Conditions:  (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 Takes metoprolol  tartrate 12.5 mg twice daily and spironolactone  12.5 mg daily for control.) Cardiovascular Conditions: Heart failure, Coronary artery disease, Hypertension, Valvular disease, High blood cholesterol Cardiovascular Management Strategies: Coping strategies, Routine screening, Medication therapy, Diet modification, Fluid modification, Adequate rest Cardiovascular Self-Management Outcome: 3 (uncertain)  Respiratory Respiratory Symptoms Reported: No symptoms reported Respiratory Conditions: Seasonal allergies, Sleep disordered breathing Respiratory Self-Management Outcome: 4 (good)  Endocrine Is patient diabetic?: Yes Is patient checking blood sugars at home?: Yes Endocrine Conditions: Diabetes Endocrine Management Strategies: Medication therapy, Coping strategies, Diet modification Endocrine Self-Management Outcome: 3 (uncertain)  Gastrointestinal Gastrointestinal Symptoms Reported: No symptoms reported   Nutrition Risk Screen (CP): No indicators present  Genitourinary Genitourinary Symptoms Reported: No symptoms reported    Integumentary Integumentary Symptoms Reported: No symptoms reported Additional Integumentary Details: suffers from intermittent stasis dermatitis, recently saw PCP on 06/11/23 for treatment of cellulitis to left inguinal crease, placed on course of Doxycycline  Skin Conditions: Other Other Skin  Conditions: stasis dermatitis, intertrigo Skin Management Strategies: Routine screening, Coping strategies, Medication therapy Skin Self-Management Outcome: 3 (uncertain)  Musculoskeletal Musculoskelatal Symptoms Reviewed: Unsteady gait, Difficulty walking Additional Musculoskeletal Details: increased neck pain after recent fall, has upcoming  MRI to look for cervical impingement Musculoskeletal Conditions: Osteoarthritis, Other Other Musculoskeletal Conditions: DDD cervical Musculoskeletal Management Strategies: Medical device, Medication therapy, Coping strategies, Routine screening, Adequate rest Musculoskeletal Self-Management Outcome: 3 (uncertain) Falls in the past year?: Yes Number of falls in past year: 1 or less Was there an injury with Fall?: Yes Fall Risk Category Calculator: 2 Patient Fall Risk Level: Moderate Fall Risk Patient at Risk for Falls Due to: History of fall(s), Impaired balance/gait, Impaired mobility Fall risk Follow up: Falls prevention discussed, Education provided, Falls evaluation completed  Psychosocial Psychosocial Symptoms Reported: No symptoms reported            06/11/2023   10:58 AM  Depression screen PHQ 2/9  Decreased Interest 0  Down, Depressed, Hopeless 0  PHQ - 2 Score 0    There were no vitals filed for this visit.  Medications Reviewed Today     Reviewed by Randye Buttner, RN (Registered Nurse) on 07/17/23 at 1144  Med List Status: <None>   Medication Order Taking? Sig Documenting Provider Last Dose Status Informant  Accu-Chek Softclix Lancets lancets [id number] Yes TEST MORNING, NOON AND BEDTIME Rodney Clamp, MD Taking Active Self  albuterol  (ACCUNEB ) 0.63 MG/3ML nebulizer solution [id number] Yes Take 1 ampule by nebulization every 6 (six) hours as needed for wheezing. [provider] Taking Active Self  albuterol  (VENTOLIN  HFA) 108 (90 Base) MCG/ACT inhaler [id number] Yes INHALE TWO PUFFS BY MOUTH EVERY SIX HOURS AS NEEDED  FOR WHEEZING OR SHORTNESS OF Arlander Bellman D, MD Taking Active   aspirin  EC (ASPIRIN  LOW DOSE) 81 MG tablet [id number] Yes TAKE ONE TABLET BY MOUTH EVERY MORNING Hugh Madura, MD Taking Active   B-D ULTRAFINE III SHORT PEN 31G X 8 MM MISC [id number] Yes USE AS DIRECTED Rodney Clamp, MD Taking Active Self  Blood Glucose Monitoring Suppl DEVI [id number] Yes 1 each by Does not apply route in the morning, at noon, and at bedtime. May substitute to any manufacturer covered by patient's insurance.Dx E11.69 Rodney Clamp, MD Taking Active Self  diclofenac  Sodium (VOLTAREN ) 1 % GEL [id number] Yes APPLY 2 GRAMS TOPICALLY 4 TIMES DAILY TO KNEE Rodney Clamp, MD Taking Active Self  doxycycline  (VIBRA -TABS) 100 MG tablet [id number]  Take 1 tablet (100 mg total) by mouth 2 (two) times daily.  Patient not taking: Reported on 07/14/2023   Rodney Clamp, MD  Active            Med Note [facility], [place] A   Fri Jul 17, 2023 11:40 AM) Has completed this course of antibiotics  ELIQUIS  5 MG TABS tablet [id number] Yes TAKE ONE TABLET BY MOUTH TWICE DAILY (AM+PM) Rodney Clamp, MD Taking Active   famotidine  (PEPCID ) 20 MG tablet [id number] Yes Take 1 tablet (20 mg total) by mouth daily. Hugh Madura, MD Taking Active   fluticasone  (FLONASE ) 50 MCG/ACT nasal spray [id number] Yes Place 1 spray into both nostrils daily. Rodney Clamp, MD Taking Active   Fluticasone -Umeclidin-Vilant (TRELEGY ELLIPTA ) 100-62.5-25 MCG/ACT AEPB [id number] Yes INHALE 1 PUFF INTO THE LUNGS DAILY Rosa College D, MD Taking Active   formoterol  (PERFOROMIST ) 20 MCG/2ML nebulizer solution [id number] Yes Take 20 mcg by nebulization every 12 (twelve) hours. [provider] Taking Active Self  glucose blood (ONETOUCH ULTRA) test strip [id number] Yes TESTING 8 TIMES DAILY Rodney Clamp, MD Taking Active   HUMALOG  100 UNIT/ML injection [id number] Yes Inject 0.2 mLs (20 Units total) into the skin 3 (three)  times daily with meals.  Sliding scale Rodney Clamp, MD Taking Active   hydroxypropyl methylcellulose / hypromellose (ISOPTO TEARS / GONIOVISC) 2.5 % ophthalmic solution [id number] Yes Place 2 drops into both eyes as needed for dry eyes. Rodney Clamp, MD Taking Active Self  insulin  glargine (LANTUS ) 100 UNIT/ML injection [id number] Yes Inject 24 Units into the skin daily. [provider] Taking Active   lidocaine  (LIDODERM ) 5 % [id number] Yes Place 1 patch onto the skin daily. Remove & Discard patch within 12 hours or as directed by MD Edson Graces, MD Taking Active Self  linaclotide  (LINZESS ) 290 MCG CAPS capsule [id number] Yes Take 290 mcg by mouth as needed. [provider] Taking Active   losartan  (COZAAR ) 100 MG tablet [id number] Yes TAKE 1 TABLET (100 MG TOTAL) BY MOUTH DAILY (AM) Sabharwal, Aditya, DO Taking Active   MAGNESIUM -OXIDE 400 (241.3 Mg) MG tablet [id number] Yes Take 1 tablet by mouth daily. [provider] Taking Active Self  metoprolol  tartrate (LOPRESSOR ) 25 MG tablet [id number] Yes TAKE 1/2 TABLET BY MOUTH TWICE A DAY (1/2AM+1/2PM) Rodney Clamp, MD Taking Active   nitroGLYCERIN  (NITROSTAT ) 0.4 MG SL tablet [id number] Yes Place 1 tablet (0.4 mg total) under the tongue every 5 (five) minutes as needed for chest pain. Rodney Clamp, MD Taking Active   potassium chloride  SA (KLOR-CON  M) 20 MEQ tablet [id number] Yes Take 2 tablets (40 mEq total) by mouth 2 (two) times daily. Hugh Madura, MD Taking Active   pregabalin  (LYRICA ) 25 MG capsule [id number] Yes Take 1 capsule (25 mg total) by mouth 2 (two) times daily. Rodney Clamp, MD Taking Active   rosuvastatin  (CRESTOR ) 20 MG tablet [id number] Yes TAKE 1 TABLET (20 MG TOTAL) BY MOUTH EVERY EVENING. Rodney Clamp, MD Taking Active   silver  sulfADIAZINE  (SSD) 1 % cream [id number] Yes APPLY PEA SIZED AMOUNT TOPICALLY TO WOUND DAILY Rodney Clamp, MD Taking Active   spironolactone  (ALDACTONE ) 25 MG tablet [id number] Yes Take 1  tablet (25 mg total) by mouth daily. Hugh Madura, MD Taking Active   torsemide  (DEMADEX ) 20 MG tablet [id number] Yes Take 1 tablet (20 mg total) by mouth 2 (two) times daily. Hugh Madura, MD Taking Active             Recommendation:   PCP Follow-up as scheduled Follow up with Cardiologist, Dr. Leita Purdue re CHF w/ follow-up ECHO on 07/31/23  Follow Up Plan:   Telephone follow up appointment date/time:  08/07/23 @ 10am  Maris Bena A. Saverio Curling RN, BA, [facility], CRRN [place]  [facility] Population Health RN Care Manager Direct Dial: [phone]  Fax: [phone]

## 2023-07-21 ENCOUNTER — Other Ambulatory Visit: Payer: Self-pay | Admitting: Physician Assistant

## 2023-07-21 ENCOUNTER — Telehealth: Payer: Self-pay | Admitting: Cardiology

## 2023-07-21 ENCOUNTER — Other Ambulatory Visit (HOSPITAL_COMMUNITY): Payer: Self-pay | Admitting: Cardiology

## 2023-07-21 NOTE — Telephone Encounter (Signed)
*  STAT* If patient is at the pharmacy, call can be transferred to refill team.   1. Which medications need to be refilled? (please list name of each medication and dose if known) MAGNESIUM -OXIDE 400 (241.3 Mg) MG tablet   2. Which pharmacy/location (including street and city if local pharmacy) is medication to be sent to? [facility] - [place], [place] - [address]   3. Do they need a 30 day or 90 day supply? 90

## 2023-07-21 NOTE — Telephone Encounter (Signed)
 Pt is requesting a refill on medication magnesium -oxide 400 mg tablet. Would Dr. Renna Cary like to refill this medication? Please address

## 2023-07-21 NOTE — Telephone Encounter (Signed)
*  STAT* If patient is at the pharmacy, call can be transferred to refill team.   1. Which medications need to be refilled? (please list name of each medication and dose if known)   MAGNESIUM -OXIDE 400 (241.3 Mg) MG tablet     2. Would you like to learn more about the convenience, safety, & potential cost savings by using the [facility]? NO   3. Are you open to using the [facility] NO   4. Which pharmacy/location (including street and city if local pharmacy) is medication to be sent to? [facility] - [place], [place] - [address]    5. Do they need a 30 day or 90 day supply? 90

## 2023-07-21 NOTE — Telephone Encounter (Signed)
 Pt is requesting a refill on medication magnesium -oxide 400 mg tablet, pt calling again. Would Dr. Renna Cary like to refill this medication? Please address

## 2023-07-21 NOTE — Telephone Encounter (Signed)
 This medication does not look to have ever been prescribed by Dr Renna Cary and the last record we have of medication stops in 2021. I would recommend patient reach out to PCP if still taking this medication.

## 2023-07-22 ENCOUNTER — Other Ambulatory Visit: Payer: Self-pay | Admitting: *Deleted

## 2023-07-22 MED ORDER — SILVER SULFADIAZINE 1 % EX CREA: TOPICAL_CREAM | CUTANEOUS | 0 refills | Status: AC

## 2023-07-29 MED ORDER — MAGNESIUM OXIDE 400 MG PO TABS: 400.0000 mg | ORAL_TABLET | Freq: Every day | ORAL | 3 refills | Status: AC

## 2023-07-30 NOTE — Progress Notes (Signed)
 ADVANCED HEART FAILURE CLINIC NOTE  Referring Physician: Kennyth Worth HERO, MD  Primary Care: Kennyth Worth HERO, MD Primary Cardiologist: Dr. Jeffrie  CC: Severe tricuspid regurgitation  HPI: [name] [name] is a 68 y.o. male with coronary artery disease, ectopic atrial tachycardia, uncontrolled hypertension, hyperlipidemia, obstructive sleep apnea, history of pulmonary embolism and severe RV enlargement with severe tricuspid regurgitation presenting today to establish care.  He was admitted in 3/24 with acute hypoxic respiratory failure in the setting of RV failure, strep bacteremia and new onset severe tricuspid regurgitation with a VQ scan suggestive for pulmonary embolism.  Interval hx:  Reports that he has gained several pounds over the last week.  Increased his torsemide  to 40 mg twice daily from 20 mg twice daily with mild improvement in symptoms.  He has had slight worsening of shortness of breath and abdominal protuberance.  PHYSICAL EXAM: Vitals:   07/31/23 1003  BP: 108/60  Pulse: 77  SpO2: 92%   GENERAL: NAD Lungs- CTA CARDIAC:  JVP: 7 cm          Normal rate with regular rhythm. no murmur.  Pulses 2+. no edema.  ABDOMEN: Soft, non-tender, non-distended.  EXTREMITIES: Warm and well perfused.  NEUROLOGIC: No obvious FND   DATA REVIEW  ECG: Normal sinus rhythm  As per my personal interpretation  ECHO: 10/07/22: LVEF 55-60%, severely dilated RV, severely dilated RA, severe TR As per my personal interpretation  CATH: 01/09/23:  HEMODYNAMICS: RA:                  6 mmHg with large V waves RV:                  40/7 mmHg PA:                  41/18 mmHg (22 mean) PCWP:            10 mmHg (mean)                                      Estimated Fick CO/CI   8.3 L/min, 3.4 L/min/m2 Thermodilution CO/CI   6.6 L/min, 2.7 L/min/m2                                              TPG                 12  mmHg                                            PVR                 1.4-1.8  Wood Units  PAPi                3.8       IMPRESSION: Normal pre and post capillary filling pressures Normal PVR Normal cardiac output & index Large V waves in RA consistent with severe TR   ASSESSMENT & PLAN:  Severe tricuspid regurgitation / RV failure - TTE dating back to 05/07/2017 with mild TR; normal leaflet coaptation.  - Admitted in 04/2022 w/ hypoxic respiratory failure with V/Q scan +  for PE; blood cx with strep pyogenous. TTE during this time with RV failure & now severe tricuspid regurgitation with no signs of endocarditis effect the TV by TEE. I suspect his TR is secondary to poor cooaptation from acute dilation of the RV. Will repeat TTE before next follow up. His PVR was only 1.4-1.8 wood units. Will discuss case with Dr. Thompson for triclip.  - spironolactone  25mg  daily  - Currently taking torsemide  40mg  BID. Last week he was taking torsemide  20mg  BID.  Will continue current dose of torsemide .  Will try to enroll patient in aqua [place] trial next week if symptoms do not improve. - Repeat echocardiogram with RV failure, IVC > 2.1cm. - ReDs reading: 34 %, normal   Ectopic atrial tachycardia - EKG today with normal sinus rhythm - Continue metoprolol  tartrate 12.5 mg twice daily.  Transition to succinate L  Coronary artery disease status post CABG -  CABG x 4 in 2005; will need to obtain records.  - continue ASA 81mg  daily & crestor  20mg  daily.  - no chest pain.   Pulmonary embolism - Diagnosed by V/Q scan in 04/2022; lower extremity dopplers negative.  - Diagnosed with pulmonary embolism in March 2024.  I spoke to him about discontinuing apixaban .  We discussed risks and benefits.  He would like to think about it.  Will plan to discontinue in the future  Hypertension - Previously unable to tolerate GDMT; spironolactone  25mg  daily only.   Hyperlipidemia - crestor  20mg  daily   Chronic kidney disease - Repeat BMP today. sCr down to 1.19 in 11/24. Will repeat today    Obstructive sleep apnea -waiting on CPAP  type 2 diabetes - Followed by his PCP  I spent 30 minutes minutes caring for this patient today including face to face time, ordering and reviewing labs, discussing discontinuation of anticoagulation, discussing clinical trials for diuresis, seeing the patient, documenting in the record, and arranging follow ups.  Loletta Harper Advanced Heart Failure Mechanical Circulatory Support

## 2023-07-31 ENCOUNTER — Ambulatory Visit (HOSPITAL_COMMUNITY): Payer: Self-pay | Admitting: Cardiology

## 2023-07-31 ENCOUNTER — Encounter (HOSPITAL_COMMUNITY): Payer: Self-pay | Admitting: Cardiology

## 2023-07-31 ENCOUNTER — Ambulatory Visit (HOSPITAL_BASED_OUTPATIENT_CLINIC_OR_DEPARTMENT_OTHER)
Admission: RE | Admit: 2023-07-31 | Discharge: 2023-07-31 | Disposition: A | Discharge status: Home / Self Care | Source: Ambulatory Visit | Attending: Cardiology | Admitting: Cardiology

## 2023-07-31 ENCOUNTER — Ambulatory Visit (HOSPITAL_COMMUNITY)
Admission: RE | Admit: 2023-07-31 | Discharge: 2023-07-31 | Disposition: A | Discharge status: Home / Self Care | Source: Ambulatory Visit | Attending: Cardiology | Admitting: Cardiology

## 2023-07-31 VITALS — BP 108/60 | HR 77 | Wt 299.0 lb

## 2023-07-31 DIAGNOSIS — N189 Chronic kidney disease, unspecified: Secondary | ICD-10-CM | POA: Insufficient documentation

## 2023-07-31 DIAGNOSIS — Z951 Presence of aortocoronary bypass graft: Secondary | ICD-10-CM | POA: Diagnosis not present

## 2023-07-31 DIAGNOSIS — I2609 Other pulmonary embolism with acute cor pulmonale: Secondary | ICD-10-CM | POA: Diagnosis not present

## 2023-07-31 DIAGNOSIS — I50812 Chronic right heart failure: Secondary | ICD-10-CM | POA: Diagnosis not present

## 2023-07-31 DIAGNOSIS — I2510 Atherosclerotic heart disease of native coronary artery without angina pectoris: Secondary | ICD-10-CM | POA: Diagnosis not present

## 2023-07-31 DIAGNOSIS — I130 Hypertensive heart and chronic kidney disease with heart failure and stage 1 through stage 4 chronic kidney disease, or unspecified chronic kidney disease: Secondary | ICD-10-CM | POA: Diagnosis not present

## 2023-07-31 DIAGNOSIS — Z86711 Personal history of pulmonary embolism: Secondary | ICD-10-CM | POA: Diagnosis present

## 2023-07-31 DIAGNOSIS — I5032 Chronic diastolic (congestive) heart failure: Secondary | ICD-10-CM | POA: Diagnosis not present

## 2023-07-31 DIAGNOSIS — I4719 Other supraventricular tachycardia: Secondary | ICD-10-CM | POA: Insufficient documentation

## 2023-07-31 DIAGNOSIS — I361 Nonrheumatic tricuspid (valve) insufficiency: Secondary | ICD-10-CM | POA: Diagnosis not present

## 2023-07-31 DIAGNOSIS — E785 Hyperlipidemia, unspecified: Secondary | ICD-10-CM | POA: Insufficient documentation

## 2023-07-31 DIAGNOSIS — G4733 Obstructive sleep apnea (adult) (pediatric): Secondary | ICD-10-CM | POA: Diagnosis not present

## 2023-07-31 DIAGNOSIS — R0602 Shortness of breath: Secondary | ICD-10-CM | POA: Diagnosis present

## 2023-07-31 DIAGNOSIS — E1122 Type 2 diabetes mellitus with diabetic chronic kidney disease: Secondary | ICD-10-CM | POA: Insufficient documentation

## 2023-07-31 LAB — CBC
HCT: 39.3 % (ref 39.0–52.0)
Hemoglobin: 13.4 g/dL (ref 13.0–17.0)
MCH: 32 pg (ref 26.0–34.0)
MCHC: 34.1 g/dL (ref 30.0–36.0)
MCV: 93.8 fL (ref 80.0–100.0)
Platelets: 235 10*3/uL (ref 150–400)
RBC: 4.19 MIL/uL — ABNORMAL LOW (ref 4.22–5.81)
RDW: 14.3 % (ref 11.5–15.5)
WBC: 7.1 10*3/uL (ref 4.0–10.5)
nRBC: 0 % (ref 0.0–0.2)

## 2023-07-31 LAB — BASIC METABOLIC PANEL WITH GFR
Anion gap: 13 (ref 5–15)
BUN: 13 mg/dL (ref 8–23)
CO2: 26 mmol/L (ref 22–32)
Calcium: 9.1 mg/dL (ref 8.9–10.3)
Chloride: 98 mmol/L (ref 98–111)
Creatinine, Ser: 1.2 mg/dL (ref 0.61–1.24)
GFR, Estimated: >60 mL/min (ref >60)
Glucose, Bld: 108 mg/dL — ABNORMAL HIGH (ref 70–99)
Potassium: 2.8 mmol/L — ABNORMAL LOW (ref 3.5–5.1)
Sodium: 137 mmol/L (ref 135–145)

## 2023-07-31 LAB — BRAIN NATRIURETIC PEPTIDE: B Natriuretic Peptide: 78 pg/mL (ref 0.0–100.0)

## 2023-07-31 LAB — ECHOCARDIOGRAM COMPLETE: S' Lateral: 2.6 cm

## 2023-07-31 NOTE — Progress Notes (Addendum)
 ReDS Vest / Clip - 07/31/23 1100       ReDS Vest / Clip   Station Marker D    Ruler Value 44    ReDS Value Range Low volume    ReDS Actual Value 34

## 2023-07-31 NOTE — Patient Instructions (Signed)
  Lab Work:  Labs done today, your results will be available in MyChart, we will contact you for abnormal readings.  Follow-Up in: 1 MONTH AS SCHEDULED   At the Advanced [facility] Failure [facility], you and your health needs are our priority. We have a designated team specialized in the treatment of Heart Failure. This Care Team includes your primary Heart Failure Specialized Cardiologist (physician), Advanced Practice Providers (APPs- Physician Assistants and Nurse Practitioners), and Pharmacist who all work together to provide you with the care you need, when you need it.   You may see any of the following providers on your designated Care Team at your next follow up:  Dr. Jules Oar Dr. Peder Bourdon Dr. Alwin Baars Dr. Judyth Nunnery Nieves Bars, NP Ruddy Corral, [place] [facility] [place], [place] Dennise Fitz, NP [place] Lee, NP Luster Salters, PharmD   Please be sure to bring in all your medications bottles to every appointment.   Need to Contact Us :  If you have any questions or concerns before your next appointment please send us  a message through [place] or call our office at [phone].    TO LEAVE A MESSAGE FOR THE NURSE SELECT OPTION 2, PLEASE LEAVE A MESSAGE INCLUDING: YOUR NAME DATE OF BIRTH CALL BACK NUMBER REASON FOR CALL**this is important as we prioritize the call backs  YOU WILL RECEIVE A CALL BACK THE SAME DAY AS LONG AS YOU CALL BEFORE 4:00 PM

## 2023-08-04 NOTE — Telephone Encounter (Signed)
 Spoke with patient confirms he is taking 40mEq KCL bid. Gave him instructions to take an extra 60 mEq KCL today. Patient verbalized understanding

## 2023-08-07 ENCOUNTER — Other Ambulatory Visit

## 2023-08-18 ENCOUNTER — Other Ambulatory Visit: Payer: Self-pay | Admitting: Family Medicine

## 2023-08-18 ENCOUNTER — Other Ambulatory Visit: Payer: Self-pay | Admitting: Physician Assistant

## 2023-08-18 DIAGNOSIS — E782 Mixed hyperlipidemia: Secondary | ICD-10-CM

## 2023-08-25 ENCOUNTER — Telehealth: Payer: Self-pay | Admitting: Cardiology

## 2023-08-25 ENCOUNTER — Ambulatory Visit: Payer: Self-pay

## 2023-08-25 NOTE — Telephone Encounter (Signed)
 Spoke to pt an hour ago. Pt made aware his reported symptoms sound like reflux, pt confirms he thinks it it.  Pt will call PCP to further discuss.  He appreciates our return  call.

## 2023-08-25 NOTE — Telephone Encounter (Signed)
 Pt states that he is starting to have a burning sensation in his chest on/off x 2 weeks like heart burn. No cp, sob. Requesting cb

## 2023-08-25 NOTE — Telephone Encounter (Signed)
 Copied from CRM [id number]. Topic: Clinical - Medication Question >> Aug 25, 2023  5:02 PM Leah C wrote: Reason for CRM: Patient called in and stated that he spoke with his heart doctor/nurse, and they think the burning in his chest could be due to heartburn and they advised him to follow up with Dr. Kennyth. Patient is asking if Dr. Kennyth could prescribe a heartburn medication for him.  [phone].

## 2023-08-25 NOTE — Telephone Encounter (Signed)
 Multiple attempts to contact pt, unsuccessful. LVMCB.

## 2023-09-09 ENCOUNTER — Telehealth: Payer: Self-pay

## 2023-09-09 NOTE — Telephone Encounter (Signed)
 Patient was identified as falling into the [place] Measure - Diabetes.   Patient was: Patient is not currently using our practice. Pt seeing [facility] for Diabetes Management. Per pt next appt scheduled.

## 2023-09-15 ENCOUNTER — Other Ambulatory Visit (HOSPITAL_COMMUNITY): Payer: Self-pay | Admitting: Cardiology

## 2023-09-15 ENCOUNTER — Other Ambulatory Visit: Payer: Self-pay | Admitting: Family Medicine

## 2023-09-24 ENCOUNTER — Telehealth: Payer: Self-pay | Admitting: Family Medicine

## 2023-09-24 NOTE — Telephone Encounter (Unsigned)
 Copied from CRM #[id number]. Topic: Clinical - Order For Equipment >> Sep 24, 2023  4:02 PM Martinique E wrote: Reason for CRM: Patient called in stating that the brakes on his walker broke, questioning if PCP could send in a order for a new 4-wheeled large walker over to [employer]. Callback number for patient is [phone].

## 2023-09-25 ENCOUNTER — Other Ambulatory Visit: Payer: Self-pay | Admitting: *Deleted

## 2023-09-25 DIAGNOSIS — R5381 Other malaise: Secondary | ICD-10-CM

## 2023-09-25 NOTE — Telephone Encounter (Signed)
DME faxed to [phone] 

## 2023-10-02 ENCOUNTER — Telehealth (HOSPITAL_COMMUNITY): Payer: Self-pay | Admitting: Cardiology

## 2023-10-02 NOTE — Telephone Encounter (Signed)
 Patient called to report weight gain Weight 8/21 297 Weight 8/22 301  Reports BLE Denies SOB or CP  Reports excellent medication compliance (uses pill packs)  Reports he has increased PO intake (drinking a lot of sodas)   Please advise

## 2023-10-05 ENCOUNTER — Telehealth (HOSPITAL_COMMUNITY): Payer: Self-pay | Admitting: Licensed Clinical Social Worker

## 2023-10-05 NOTE — Telephone Encounter (Signed)
 CSW consulted to speak with pt regarding transportation to appt on Wednesday of this week- left VM on both phone numbers listed for patient  Will continue to attempt contact  Annais Crafts H. Darin Redmann, LCSW Clinical Social Worker [facility] Desk#: [phone] Cell#: [phone]

## 2023-10-05 NOTE — Telephone Encounter (Signed)
 H&V Care Navigation CSW Progress Note  Clinical Social Worker informed pt unable to get ride to clinic appt on Wednesday.  Pt reports he normally drives himself but car is currently broken down.  States that he called insurance but it was too close to appt for them to arrange.  CSW set up taxi to bring to appt- pick up from home 1:15pm.   SDOH Screenings   Food Insecurity: No Food Insecurity (07/17/2023)  Housing: Low Risk  (07/17/2023)  Transportation Needs: No Transportation Needs (07/17/2023)  Utilities: Not At Risk (07/17/2023)  Alcohol Screen: Low Risk  (03/13/2023)  Depression (PHQ2-9): Low Risk  (06/11/2023)  Financial Resource Strain: Low Risk  (12/04/2022)  Physical Activity: Insufficiently Active (03/13/2023)  Social Connections: Socially Isolated (02/18/2023)  Stress: No Stress Concern Present (12/04/2022)  Tobacco Use: Medium Risk (07/31/2023)  Health Literacy: Adequate Health Literacy (07/17/2023)    Andriette HILARIO Leech, LCSW Clinical Social Worker [facility] Desk#: [phone] Cell#: [phone]

## 2023-10-06 ENCOUNTER — Telehealth (HOSPITAL_COMMUNITY): Payer: Self-pay | Admitting: Licensed Clinical Social Worker

## 2023-10-06 NOTE — Telephone Encounter (Signed)
 Entered in error

## 2023-10-07 ENCOUNTER — Encounter (HOSPITAL_COMMUNITY): Payer: Self-pay | Admitting: Cardiology

## 2023-10-07 ENCOUNTER — Ambulatory Visit (HOSPITAL_COMMUNITY)
Admission: RE | Admit: 2023-10-07 | Discharge: 2023-10-07 | Disposition: A | Discharge status: Home / Self Care | Source: Ambulatory Visit | Attending: Cardiology | Admitting: Cardiology

## 2023-10-07 VITALS — BP 110/70 | HR 69 | Wt 304.0 lb

## 2023-10-07 DIAGNOSIS — Z7982 Long term (current) use of aspirin: Secondary | ICD-10-CM | POA: Diagnosis not present

## 2023-10-07 DIAGNOSIS — E785 Hyperlipidemia, unspecified: Secondary | ICD-10-CM | POA: Diagnosis not present

## 2023-10-07 DIAGNOSIS — E1122 Type 2 diabetes mellitus with diabetic chronic kidney disease: Secondary | ICD-10-CM | POA: Diagnosis not present

## 2023-10-07 DIAGNOSIS — I130 Hypertensive heart and chronic kidney disease with heart failure and stage 1 through stage 4 chronic kidney disease, or unspecified chronic kidney disease: Secondary | ICD-10-CM | POA: Diagnosis not present

## 2023-10-07 DIAGNOSIS — I2510 Atherosclerotic heart disease of native coronary artery without angina pectoris: Secondary | ICD-10-CM | POA: Diagnosis not present

## 2023-10-07 DIAGNOSIS — R609 Edema, unspecified: Secondary | ICD-10-CM | POA: Diagnosis not present

## 2023-10-07 DIAGNOSIS — I4719 Other supraventricular tachycardia: Secondary | ICD-10-CM | POA: Diagnosis not present

## 2023-10-07 DIAGNOSIS — I071 Rheumatic tricuspid insufficiency: Secondary | ICD-10-CM | POA: Insufficient documentation

## 2023-10-07 DIAGNOSIS — I5032 Chronic diastolic (congestive) heart failure: Secondary | ICD-10-CM | POA: Diagnosis not present

## 2023-10-07 DIAGNOSIS — N189 Chronic kidney disease, unspecified: Secondary | ICD-10-CM | POA: Diagnosis not present

## 2023-10-07 DIAGNOSIS — Z79899 Other long term (current) drug therapy: Secondary | ICD-10-CM | POA: Diagnosis not present

## 2023-10-07 DIAGNOSIS — Z951 Presence of aortocoronary bypass graft: Secondary | ICD-10-CM | POA: Insufficient documentation

## 2023-10-07 DIAGNOSIS — Z86711 Personal history of pulmonary embolism: Secondary | ICD-10-CM | POA: Diagnosis not present

## 2023-10-07 DIAGNOSIS — I25708 Atherosclerosis of coronary artery bypass graft(s), unspecified, with other forms of angina pectoris: Secondary | ICD-10-CM | POA: Diagnosis not present

## 2023-10-07 DIAGNOSIS — G4733 Obstructive sleep apnea (adult) (pediatric): Secondary | ICD-10-CM | POA: Diagnosis not present

## 2023-10-07 LAB — BASIC METABOLIC PANEL WITH GFR
Anion gap: 12 (ref 5–15)
BUN: 21 mg/dL (ref 8–23)
CO2: 24 mmol/L (ref 22–32)
Calcium: 9.5 mg/dL (ref 8.9–10.3)
Chloride: 99 mmol/L (ref 98–111)
Creatinine, Ser: 1.5 mg/dL — ABNORMAL HIGH (ref 0.61–1.24)
GFR, Estimated: 50 mL/min — ABNORMAL LOW (ref >60)
Glucose, Bld: 232 mg/dL — ABNORMAL HIGH (ref 70–99)
Potassium: 3.7 mmol/L (ref 3.5–5.1)
Sodium: 135 mmol/L (ref 135–145)

## 2023-10-07 LAB — BRAIN NATRIURETIC PEPTIDE: B Natriuretic Peptide: 148.4 pg/mL — ABNORMAL HIGH (ref 0.0–100.0)

## 2023-10-07 MED ORDER — NITROGLYCERIN 0.4 MG SL SUBL
0.4000 mg | SUBLINGUAL_TABLET | SUBLINGUAL | 3 refills | Status: AC | PRN
Start: 2023-10-07 — End: ?

## 2023-10-07 NOTE — Progress Notes (Signed)
 [facility] NOTE  Referring Physician: Kennyth Worth HERO, MD  Primary Care: Kennyth Worth HERO, MD Primary Cardiologist: Dr. Jeffrie  CC: Severe tricuspid regurgitation  HPI: [name] [name] is a 68 y.o. male with coronary artery disease, ectopic atrial tachycardia, uncontrolled hypertension, hyperlipidemia, obstructive sleep apnea, history of pulmonary embolism and severe RV enlargement with severe tricuspid regurgitation presenting today for follow up.   He was admitted in 3/24 with acute hypoxic respiratory failure in the setting of RV failure, strep bacteremia and new onset severe tricuspid regurgitation with a VQ scan suggestive for pulmonary embolism.  Interval hx:  Reports that he has gained several pounds over the last week.  Increased his torsemide  to 40 mg twice daily from 20 mg twice daily with mild improvement in symptoms.  He has had slight worsening of shortness of breath and abdominal protuberance. His weight at home is generally around 294lbs; now up to 304lbs.   PHYSICAL EXAM: Vitals:   10/07/23 1348  BP: 110/70  Pulse: 69  SpO2: 93%   GENERAL: NAD Lungs- CTA CARDIAC:  JVP: 12 cm          Normal rate with regular rhythm. 3/6 systolic murmur.  Pulses 2+. 3+ edema.  ABDOMEN: Soft, non-tender, non-distended.  EXTREMITIES: Warm and well perfused.  NEUROLOGIC: No obvious FND   DATA REVIEW  ECG: Normal sinus rhythm  As per my personal interpretation  ECHO: 10/07/22: LVEF 55-60%, severely dilated RV, severely dilated RA, severe TR As per my personal interpretation  CATH: 01/09/23:  HEMODYNAMICS: RA:                  6 mmHg with large V waves RV:                  40/7 mmHg PA:                  41/18 mmHg (22 mean) PCWP:            10 mmHg (mean)                                      Estimated Fick CO/CI   8.3 L/min, 3.4 L/min/m2 Thermodilution CO/CI   6.6 L/min, 2.7 L/min/m2                                              TPG                 12   mmHg                                            PVR                 1.4-1.8 Wood Units  PAPi                3.8       IMPRESSION: Normal pre and post capillary filling pressures Normal PVR Normal cardiac output & index Large V waves in RA consistent with severe TR   ASSESSMENT & PLAN:  Severe tricuspid regurgitation / RV failure - TTE dating back to 05/07/2017 with mild TR;  normal leaflet coaptation.  - Admitted in 04/2022 w/ hypoxic respiratory failure with V/Q scan + for PE; blood cx with strep pyogenous. TTE during this time with RV failure & now severe tricuspid regurgitation with no signs of endocarditis effect the TV by TEE. I suspect his TR is secondary to poor cooaptation from acute dilation of the RV. Will repeat TTE before next follow up. His PVR was only 1.4-1.8 wood units. Will discuss case with Dr. Thompson for triclip.  - spironolactone  25mg  daily  - He presents volume overloaded today with 2-3+ pitting edema to the knees. He can walk up to the ER from our clinic before needing to stop for dyspnea;  overall symptoms appear to be NYHA III. Currently compliant with all meds including torsemide  20mg  BID.  - Will try to enroll in Aquapass trial; labs pending. Otherwise plan to rapidly increase oral diuretics.   Ectopic atrial tachycardia -EKG previously NSR  Coronary artery disease status post CABG -  CABG x 4 in 2005; will need to obtain records.  - continue ASA 81mg  daily & crestor  20mg  daily.  - no chest pain.   Pulmonary embolism - Diagnosed by V/Q scan in 04/2022; lower extremity dopplers negative.  - Diagnosed with pulmonary embolism in March 2024. I spoke to him about discontinuing apixaban ; he has previously wanted to hold off and continue for the time being.   Hypertension - Previously unable to tolerate GDMT; spironolactone  25mg  daily only.   Hyperlipidemia - crestor  20mg  daily   Chronic kidney disease - Repeat BMP today.   Obstructive sleep apnea -waiting  on CPAP  type 2 diabetes - Followed by his PCP  I spent 40 minutes caring for this patient today including face to face time, ordering and reviewing labs, discussing enrollment into clinical trials (Aquapass), counseling on low sodium diet, seeing the patient, documenting in the record, and arranging follow ups.   [name] [name] [facility] Mechanical Circulatory Support

## 2023-10-07 NOTE — Patient Instructions (Signed)
 There has been no changes to your medications.  Labs done today, your results will be available in MyChart, we will contact you for abnormal readings.  Your physician recommends that you schedule a follow-up appointment in: 1 month.  If you have any questions or concerns before your next appointment please send us  a message through [place] or call our office at [phone].    TO LEAVE A MESSAGE FOR THE NURSE SELECT OPTION 2, PLEASE LEAVE A MESSAGE INCLUDING: YOUR NAME DATE OF BIRTH CALL BACK NUMBER REASON FOR CALL**this is important as we prioritize the call backs  YOU WILL RECEIVE A CALL BACK THE SAME DAY AS LONG AS YOU CALL BEFORE 4:00 PM  At the [facility], you and your health needs are our priority. As part of our continuing mission to provide you with exceptional heart care, we have created designated Provider Care Teams. These Care Teams include your primary Cardiologist (physician) and Advanced Practice Providers (APPs- Physician Assistants and Nurse Practitioners) who all work together to provide you with the care you need, when you need it.   You may see any of the following providers on your designated Care Team at your next follow up: Dr Jules Oar Dr Peder Bourdon Dr. Alwin Baars Dr. Arta Lark Amy Marijane Shoulders, NP Ruddy Corral, [place] [facility] [place], [place] Dennise Fitz, NP [place] Lee, NP Shawnee Dellen, NP Luster Salters, PharmD Bevely Brush, PharmD   Please be sure to bring in all your medications bottles to every appointment.    Thank you for choosing [facility]

## 2023-10-08 NOTE — Telephone Encounter (Signed)
 WILL CLOSE ENCOUNTER, MULTIPLE UNSUCCESSFUL ATTEMPTS  PT SEEN IN OFFICE 8/27

## 2023-10-09 ENCOUNTER — Encounter

## 2023-10-09 VITALS — BP 106/48 | HR 93 | Resp 18

## 2023-10-09 DIAGNOSIS — Z006 Encounter for examination for normal comparison and control in clinical research program: Secondary | ICD-10-CM

## 2023-10-09 NOTE — Research (Addendum)
 Reform Informed Consent   Subject Name: [name] [name]  Subject met inclusion and exclusion criteria.  The informed consent form, study requirements and expectations were reviewed with the subject and questions and concerns were addressed prior to the signing of the consent form.  The subject verbalized understanding of the trial requirements.  The subject agreed to participate in the Reform trial and signed the informed consent at 1000 on 10/09/2023.  The informed consent was obtained prior to performance of any protocol-specific procedures for the subject.  A copy of the signed informed consent was given to the subject and a copy was placed in the subject's medical record.   Eligha Kmetz    Subject is a Screen Fail due to low NT Pro Bnp <600

## 2023-10-10 LAB — BASIC METABOLIC PANEL WITH GFR
BUN/Creatinine Ratio: 12 (ref 10–24)
BUN: 17 mg/dL (ref 8–27)
CO2: 23 mmol/L (ref 20–29)
Calcium: 9.1 mg/dL (ref 8.6–10.2)
Chloride: 100 mmol/L (ref 96–106)
Creatinine, Ser: 1.39 mg/dL — ABNORMAL HIGH (ref 0.76–1.27)
Glucose: 221 mg/dL — ABNORMAL HIGH (ref 70–99)
Potassium: 3.7 mmol/L (ref 3.5–5.2)
Sodium: 139 mmol/L (ref 134–144)
eGFR: 55 mL/min/1.73 — ABNORMAL LOW (ref >59)

## 2023-10-10 LAB — PRO B NATRIURETIC PEPTIDE: NT-Pro BNP: 296 pg/mL (ref 0–376)

## 2023-10-13 ENCOUNTER — Ambulatory Visit (HOSPITAL_COMMUNITY): Payer: Self-pay | Admitting: Cardiology

## 2023-10-13 DIAGNOSIS — I5032 Chronic diastolic (congestive) heart failure: Secondary | ICD-10-CM

## 2023-10-13 NOTE — Telephone Encounter (Signed)
 Called patient per Dr. Gardenia with following lab results and instructions:  Can we please ask him to double AM torsemide  for the next 5 days. He will not be a candidate for the aquapass trial. He needs to double his potassium during this time also. Repeat BMP next week.  Pt verbalized understanding of same. Repeat lab ordered and scheduled.

## 2023-10-14 ENCOUNTER — Ambulatory Visit (INDEPENDENT_AMBULATORY_CARE_PROVIDER_SITE_OTHER): Admitting: Podiatry

## 2023-10-14 ENCOUNTER — Encounter: Payer: Self-pay | Admitting: Podiatry

## 2023-10-14 ENCOUNTER — Other Ambulatory Visit: Payer: Self-pay | Admitting: Family Medicine

## 2023-10-14 DIAGNOSIS — M79671 Pain in right foot: Secondary | ICD-10-CM

## 2023-10-14 DIAGNOSIS — M79672 Pain in left foot: Secondary | ICD-10-CM | POA: Diagnosis not present

## 2023-10-14 DIAGNOSIS — E782 Mixed hyperlipidemia: Secondary | ICD-10-CM

## 2023-10-14 DIAGNOSIS — B351 Tinea unguium: Secondary | ICD-10-CM

## 2023-10-14 NOTE — Progress Notes (Signed)
 Patient presents for evaluation and treatment of tenderness and some redness around nails feet.  Tenderness around toes with walking and wearing shoes.  Physical exam:  General appearance: Alert, pleasant, and in no acute distress.  Vascular: Pedal pulses: DP 2/4 B/L, PT 0/4 B/L.  Severe edema lower legs bilaterally.  Neu  Dermatologic:  Nails thickened, disfigured, discolored 1-5 BL with subungual debris.  Redness and hypertrophic nail folds along nail folds bilaterally but no signs of drainage or infection.  Musculoskeletal:     Diagnosis: 1. Painful onychomycotic nails 1 through 5 bilaterally. 2. Pain toes 1 through 5 bilaterally.  Plan: -Debrided onychomycotic nails 1 through 5 bilaterally.  Sharply debrided nails with nail clipper and reduced with a power bur.  Return 3 months [facility]

## 2023-10-15 ENCOUNTER — Telehealth (HOSPITAL_COMMUNITY): Payer: Self-pay | Admitting: Licensed Clinical Social Worker

## 2023-10-15 NOTE — Telephone Encounter (Signed)
 H&V Care Navigation CSW Progress Note  Clinical Social Worker consulted to speak with pt regarding transportation to lab appt next week.  Pt confirms his car is still out of service and needs a ride.  Educated pt on his insurance benefit for transportation- CSW texted pt appt details and he will reach out to insurance to arrange.  Encouraged pt to inform CSW if he has any issues arranging the ride.   SDOH Screenings   Food Insecurity: No Food Insecurity (07/17/2023)  Housing: Low Risk  (07/17/2023)  Transportation Needs: No Transportation Needs (10/05/2023)  Utilities: Not At Risk (07/17/2023)  Alcohol Screen: Low Risk  (03/13/2023)  Depression (PHQ2-9): Low Risk  (06/11/2023)  Financial Resource Strain: Low Risk  (12/04/2022)  Physical Activity: Insufficiently Active (03/13/2023)  Social Connections: Socially Isolated (02/18/2023)  Stress: No Stress Concern Present (12/04/2022)  Tobacco Use: Medium Risk (10/14/2023)  Health Literacy: Adequate Health Literacy (07/17/2023)   Andriette HILARIO Leech, LCSW Clinical Social Worker [facility] Desk#: [phone] Cell#: [phone]

## 2023-10-19 DIAGNOSIS — Z794 Long term (current) use of insulin: Secondary | ICD-10-CM | POA: Diagnosis not present

## 2023-10-19 DIAGNOSIS — E1165 Type 2 diabetes mellitus with hyperglycemia: Secondary | ICD-10-CM | POA: Diagnosis not present

## 2023-10-19 DIAGNOSIS — E1142 Type 2 diabetes mellitus with diabetic polyneuropathy: Secondary | ICD-10-CM | POA: Diagnosis not present

## 2023-10-19 DIAGNOSIS — Z8719 Personal history of other diseases of the digestive system: Secondary | ICD-10-CM | POA: Diagnosis not present

## 2023-10-19 LAB — HEMOGLOBIN A1C
A1c: 10.9
Hemoglobin A1C: 10.9

## 2023-10-22 ENCOUNTER — Telehealth: Payer: Self-pay | Admitting: *Deleted

## 2023-10-22 ENCOUNTER — Ambulatory Visit: Payer: Self-pay

## 2023-10-22 ENCOUNTER — Ambulatory Visit (HOSPITAL_COMMUNITY)
Admission: RE | Admit: 2023-10-22 | Discharge: 2023-10-22 | Disposition: A | Discharge status: Home / Self Care | Source: Ambulatory Visit | Attending: Cardiology | Admitting: Cardiology

## 2023-10-22 DIAGNOSIS — I5032 Chronic diastolic (congestive) heart failure: Secondary | ICD-10-CM | POA: Insufficient documentation

## 2023-10-22 LAB — BASIC METABOLIC PANEL WITH GFR
Anion gap: 10 (ref 5–15)
BUN: 21 mg/dL (ref 8–23)
CO2: 23 mmol/L (ref 22–32)
Calcium: 9.1 mg/dL (ref 8.9–10.3)
Chloride: 100 mmol/L (ref 98–111)
Creatinine, Ser: 1.36 mg/dL — ABNORMAL HIGH (ref 0.61–1.24)
GFR, Estimated: 56 mL/min — ABNORMAL LOW (ref >60)
Glucose, Bld: 214 mg/dL — ABNORMAL HIGH (ref 70–99)
Potassium: 3.9 mmol/L (ref 3.5–5.1)
Sodium: 133 mmol/L — ABNORMAL LOW (ref 135–145)

## 2023-10-22 NOTE — Telephone Encounter (Addendum)
 3 attempts made to contact pt, no contact made. LVMTCB x3. Routing to clinic.   Message from Morton D sent at 10/22/2023 10:07 AM EDT  pt calling due to blister on leg pt put ointment and bandage on leg but is worried of infection pt would like to know what to do

## 2023-10-22 NOTE — Telephone Encounter (Signed)
 Copied from CRM [id number]. Topic: Clinical - Medical Advice >> Oct 21, 2023  2:44 PM Chiquita SQUIBB wrote: Reason for CRM: Patient is calling in with a large blister on his leg and is not sure what he should do and if he should bandage it. Patient denied scheduling due to transportation.  Spoke with patient schedule an OV with PCP for 10/26/2023 [facility]

## 2023-10-22 NOTE — Telephone Encounter (Signed)
 FYI Only or Action Required?: FYI only for provider.  Patient was last seen in primary care on 06/11/2023 by Kennyth Worth HERO, MD.  Called Nurse Triage reporting Blister.  Symptoms began yesterday.  Interventions attempted: Prescription medications: silver .  Symptoms are: stable.  Triage Disposition: Home Care  Patient/caregiver understands and will follow disposition?: Yes    Copied from CRM [id number]. Topic: Clinical - Pink Word Triage >> Oct 22, 2023 10:06 AM Carlyon D wrote: Reason for Triage:  pt calling due to blister on leg pt put ointment and bandage on leg but is worried of infection pt would like to know what to do >> Oct 22, 2023 10:07 AM Paige D wrote: pt calling due to blister on leg pt put ointment and bandage on leg but is worried of infection pt would like to know what to do Reason for Disposition  Normal hot spot from friction  Answer Assessment - Initial Assessment Questions 1. APPEARANCE of BLISTER: What does it look like?     Blister is open now and has covered with wound cream, band aid on 2. SIZE: How large is the blister? (e.g., inches, cm or compare to coins)     Not even an inch big, like grease hit it 3. LOCATION: Where are the blisters located?      Back of left leg 4. WHEN: When did the blister happen?     yesterday 5. CAUSE: What do you think caused the blister?     grease 6. PAIN: Does it hurt? If Yes, ask: How bad is the pain?  (Scale 0-10; or none, mild, moderate, severe)     denies 7. OTHER SYMPTOMS: Do you have any other symptoms? (e.g., fever)     no  Protocols used: Blister - Foot and Hand-A-AH

## 2023-10-26 ENCOUNTER — Other Ambulatory Visit: Payer: Self-pay

## 2023-10-26 ENCOUNTER — Telehealth: Payer: Self-pay | Admitting: *Deleted

## 2023-10-26 ENCOUNTER — Ambulatory Visit (HOSPITAL_COMMUNITY)
Admission: EM | Admit: 2023-10-26 | Discharge: 2023-10-26 | Disposition: A | Discharge status: Home / Self Care | Attending: Family Medicine | Admitting: Family Medicine

## 2023-10-26 ENCOUNTER — Ambulatory Visit: Admitting: Family Medicine

## 2023-10-26 ENCOUNTER — Encounter (HOSPITAL_COMMUNITY): Payer: Self-pay | Admitting: Emergency Medicine

## 2023-10-26 DIAGNOSIS — I872 Venous insufficiency (chronic) (peripheral): Secondary | ICD-10-CM

## 2023-10-26 DIAGNOSIS — L97821 Non-pressure chronic ulcer of other part of left lower leg limited to breakdown of skin: Secondary | ICD-10-CM | POA: Diagnosis not present

## 2023-10-26 MED ORDER — CEPHALEXIN 500 MG PO CAPS
500.0000 mg | ORAL_CAPSULE | Freq: Two times a day (BID) | ORAL | 0 refills | Status: AC
Start: 2023-10-26 — End: 2023-11-03

## 2023-10-26 MED ORDER — BACITRACIN ZINC 500 UNIT/GM EX OINT
TOPICAL_OINTMENT | CUTANEOUS | Status: AC
  Filled 2023-10-26: qty 0.9

## 2023-10-26 MED ORDER — MUPIROCIN 2 % EX OINT: 1.0000 | TOPICAL_OINTMENT | Freq: Two times a day (BID) | CUTANEOUS | 0 refills | Status: DC

## 2023-10-26 NOTE — Progress Notes (Unsigned)
 Complex Care Management Care Guide Note  10/26/2023 Name: [name] [name] MRN: [id number] DOB: [birth year]  [name] [name] is a 68 y.o. year old male who is a primary care patient of Kennyth Worth HERO, MD and is actively engaged with the care management team. I reached out to [name] [name] by phone today to assist with re-scheduling  with the RN Case Manager.  Follow up plan: Unsuccessful telephone outreach attempt made. A HIPAA compliant phone message was left for the patient providing contact information and requesting a return call.  [name] [name], CMA [place]  [facility], [facility] Guide Direct Dial: [phone]  Fax: [phone] Website: [email or web address]

## 2023-10-26 NOTE — Telephone Encounter (Signed)
 Appt today

## 2023-10-26 NOTE — Discharge Instructions (Signed)
 Cephalexin  500 mg --1 tablet by mouth 2 times daily for 7 days.  Wash the wounds twice daily with soapy water and then put new antibiotic ointment on  Put mupirocin  ointment on the sore areas twice daily until improved  Please follow-up with your primary care about this issue

## 2023-10-26 NOTE — ED Provider Notes (Addendum)
 [facility]    CSN: [id number] Arrival date & time: 10/26/23  1505      History   Chief Complaint No chief complaint on file.   HPI KALIJAH ZEISS is a 68 y.o. male.   HPI Here for wounds on his left lower leg.  He states he noticed blisters last week and then the wounds have been weeping in the last day or 2 and have opened up.  There is 1 on the front of his left lower leg and 1 on the back of his left lower leg.  He calls them burns, and on questioning states that he thinks maybe some grease popped out on the leg.  It turns out though, he is not really sure.  No fever or chills  He has poorly controlled diabetes.  He states his primary care has just stopped prescribing the fluid pills.  He has chronic pedal edema.  Ibuprofen causes stomach pain and empaglifozin causes yeast infections Past Medical History:  Diagnosis Date   Acute myocardial infarction, unspecified site, episode of care unspecified 2005   Acute pancreatitis    CAD (coronary artery disease)    a. CABG in 2005 w LIMA to LAD, left radial to second circumflex marginal, saphenous vein graft to PDA, saphenous vein graft to lateral subbrach of ramus intermediate, and sequential saphenous vein graft to the medial subbranch of ramus intermediate.   Chronic diastolic CHF (congestive heart failure) (HCC)    Cirrhosis of liver without mention of alcohol    CKD (chronic kidney disease), stage II    Complications affecting other specified body systems, hypertension    Ectopic atrial rhythm    Essential hypertension    Hyperlipidemia    Hypokalemia    a. intermitttent noncompliance with potassium supplement.   Hypomagnesemia    Morbid obesity (HCC)    Neuropathy    Other and unspecified hyperlipidemia    Proteinuria    RBBB    Sleep apnea    Type II or unspecified type diabetes mellitus without mention of complication, not stated as uncontrolled    Varicose veins of both lower extremities      Patient Active Problem List   Diagnosis Date Noted   Stasis dermatitis 06/13/2022   Constipation 05/30/2022   Debility 05/30/2022   Severe tricuspid regurgitation 04/19/2022   Pulmonary embolism (HCC) 04/19/2022   Chronic right-sided heart failure (HCC) 04/19/2022   Decreased hearing 04/15/2022   Preoperative clearance 08/27/2021   Ectopic atrial tachycardia (HCC) 02/12/2021   Simple chronic bronchitis (HCC) 12/17/2020   PND (post-nasal drip) 12/17/2020   Cough 10/31/2019   Intertrigo 10/31/2019   Coronary artery disease of bypass graft of native heart with stable angina pectoris (HCC)    Type 2 diabetes mellitus with other specified complication (HCC), followed by Dr. Faythe 10/14/2018   Chronic heart failure with preserved ejection fraction (HCC) 08/29/2017   Varicose vein of leg 04/13/2017   Polyneuropathy associated with underlying disease (HCC)    Osteoarthritis 01/21/2017   Seasonal and perennial allergic rhinitis 07/17/2016   Ulnar neuritis, left 03/27/2016   PVD (peripheral vascular disease) (HCC) 04/30/2009   Morbid obesity (HCC)    HLD (hyperlipidemia) 01/04/2007   Obstructive sleep apnea 01/04/2007   HTN (hypertension) 01/04/2007    Past Surgical History:  Procedure Laterality Date   CORONARY ARTERY BYPASS GRAFT     2005   I & D EXTREMITY Left 09/02/2019   Procedure: IRRIGATION AND DEBRIDEMENT 4th TOE DEBRIDIMENT;  Surgeon:  Gretel Ozell PARAS, DPM;  Location: [facility];  Service: Podiatry;  Laterality: Left;   RIGHT HEART CATH N/A 01/09/2023   Procedure: RIGHT HEART CATH;  Surgeon: Gardenia Led, DO;  Location: [facility];  Service: Cardiovascular;  Laterality: N/A;   TEE WITHOUT CARDIOVERSION N/A 04/25/2022   Procedure: TRANSESOPHAGEAL ECHOCARDIOGRAM (TEE);  Surgeon: Pietro Redell RAMAN, MD;  Location: [facility];  Service: Cardiovascular;  Laterality: N/A;       Home Medications    Prior to Admission medications   Medication Sig Start Date End  Date Taking? Authorizing Provider  cephALEXin  (KEFLEX ) 500 MG capsule Take 1 capsule (500 mg total) by mouth 2 (two) times daily for 7 days. 10/26/23 11/02/23 Yes BanisterSharlet POUR, MD  mupirocin  ointment (BACTROBAN ) 2 % Apply 1 Application topically 2 (two) times daily. To affected area till better 10/26/23  Yes Khyler Urda, Sharlet POUR, MD  Accu-Chek Softclix Lancets lancets TEST MORNING, NOON AND BEDTIME 11/17/22   Kennyth Worth HERO, MD  albuterol  (ACCUNEB ) 0.63 MG/3ML nebulizer solution Take 1 ampule by nebulization every 6 (six) hours as needed for wheezing.    [provider]  albuterol  (VENTOLIN  HFA) 108 (90 Base) MCG/ACT inhaler INHALE TWO PUFFS BY MOUTH EVERY SIX HOURS AS NEEDED FOR WHEEZING OR SHORTNESS OF BREATH 02/23/23   Neysa Rama D, MD  aspirin  EC (ASPIRIN  LOW DOSE) 81 MG tablet TAKE ONE TABLET BY MOUTH EVERY MORNING 05/21/23   Jeffrie Oneil BROCKS, MD  B-D ULTRAFINE III SHORT PEN 31G X 8 MM MISC USE AS DIRECTED 09/22/22   Kennyth Worth HERO, MD  Blood Glucose Monitoring Suppl DEVI 1 each by Does not apply route in the morning, at noon, and at bedtime. May substitute to any manufacturer covered by patient's insurance.Dx E11.69 10/15/22   Kennyth Worth HERO, MD  diclofenac  Sodium (VOLTAREN ) 1 % GEL APPLY 2 GRAMS TOPICALLY 4 TIMES DAILY TO KNEE 05/13/22   Kennyth Worth HERO, MD  ELIQUIS  5 MG TABS tablet TAKE ONE TABLET BY MOUTH TWICE DAILY (AM+PM) 10/15/23   Kennyth Worth HERO, MD  famotidine  (PEPCID ) 20 MG tablet Take 1 tablet (20 mg total) by mouth daily. 04/20/23   Jeffrie Oneil BROCKS, MD  fluticasone  (FLONASE ) 50 MCG/ACT nasal spray Place 1 spray into both nostrils daily. 01/16/23   Kennyth Worth HERO, MD  Fluticasone -Umeclidin-Vilant (TRELEGY ELLIPTA ) 100-62.5-25 MCG/ACT AEPB INHALE 1 PUFF INTO THE LUNGS DAILY 01/27/23   Neysa Rama D, MD  formoterol  (PERFOROMIST ) 20 MCG/2ML nebulizer solution Take 20 mcg by nebulization every 12 (twelve) hours.    [provider]  glucose blood (ONETOUCH ULTRA) test strip  TESTING 8 TIMES DAILY 04/14/23   Kennyth Worth HERO, MD  hydroxypropyl methylcellulose / hypromellose (ISOPTO TEARS / GONIOVISC) 2.5 % ophthalmic solution Place 2 drops into both eyes as needed for dry eyes. 04/15/22   Kennyth Worth HERO, MD  insulin  glargine (LANTUS ) 100 UNIT/ML injection Inject 24 Units into the skin daily.    [provider]  insulin  lispro (HUMALOG ) 100 UNIT/ML injection Inject 22 Units into the skin 3 (three) times daily before meals.    [provider]  lidocaine  (LIDODERM ) 5 % Place 1 patch onto the skin daily. Remove & Discard patch within 12 hours or as directed by MD 04/17/22   Theadore Ozell HERO, MD  linaclotide  (LINZESS ) 290 MCG CAPS capsule Take 290 mcg by mouth as needed.    [provider]  losartan  (COZAAR ) 100 MG tablet Take 1 tablet (100 mg total) by  mouth daily. PLEASE SCHEDULE APPOINTMENT FOR MORE REFILLS 09/15/23   Sabharwal, Aditya, DO  magnesium  oxide (MAG-OX) 400 MG tablet Take 1 tablet (400 mg total) by mouth daily. 07/29/23   Jeffrie Oneil BROCKS, MD  metoprolol  tartrate (LOPRESSOR ) 25 MG tablet TAKE 1/2 TABLET BY MOUTH TWICE A DAY (1/2AM+1/2PM) 09/15/23   Kennyth Worth HERO, MD  nitroGLYCERIN  (NITROSTAT ) 0.4 MG SL tablet Place 1 tablet (0.4 mg total) under the tongue every 5 (five) minutes as needed for chest pain. 10/07/23   Sabharwal, Aditya, DO  potassium chloride  SA (KLOR-CON  M) 20 MEQ tablet Take 2 tablets (40 mEq total) by mouth 2 (two) times daily. Please call the office at [phone] to schedule an appointment with Dr. Jeffrie for future refills. Thank you. 08/20/23   Jeffrie Oneil BROCKS, MD  pregabalin  (LYRICA ) 25 MG capsule Take 1 capsule (25 mg total) by mouth 2 (two) times daily. 07/10/23   Kennyth Worth HERO, MD  rosuvastatin  (CRESTOR ) 20 MG tablet TAKE 1 TABLET (20 MG TOTAL) BY MOUTH EVERY EVENING. 10/15/23   Kennyth Worth HERO, MD  silver  sulfADIAZINE  (SSD) 1 % cream APPLY PEA SIZED AMOUNT TOPICALLY TO WOUND DAILY 07/22/23   Kennyth Worth HERO, MD  spironolactone   (ALDACTONE ) 25 MG tablet Take 1 tablet (25 mg total) by mouth daily. 07/21/23   Jeffrie Oneil BROCKS, MD  torsemide  (DEMADEX ) 20 MG tablet Take 1 tablet (20 mg total) by mouth 2 (two) times daily. 06/18/23   Jeffrie Oneil BROCKS, MD    Family History Family History  Problem Relation Age of Onset   Breast cancer Mother    Prostate cancer Father    Coronary artery disease Father        underwent CABG    Social History Social History   Tobacco Use   Smoking status: Former    Current packs/day: 0.00    Average packs/day: 1.5 packs/day for 30.0 years (45.0 ttl pk-yrs)    Types: Cigarettes    Start date: 10/21/1974    Quit date: 10/20/2004    Years since quitting: 19.0    Passive exposure: Never   Smokeless tobacco: Never  Vaping Use   Vaping status: Never Used  Substance Use Topics   Alcohol use: Yes    Comment: occasional   Drug use: No     Allergies   Empagliflozin  and Ibuprofen   Review of Systems Review of Systems   Physical Exam Triage Vital Signs ED Triage Vitals [10/26/23 1734]  Encounter Vitals Group     BP 128/76     Girls Systolic BP Percentile      Girls Diastolic BP Percentile      Boys Systolic BP Percentile      Boys Diastolic BP Percentile      Pulse Rate 76     Resp 20     Temp 97.6 F (36.4 C)     Temp Source Oral     SpO2 95 %     Weight      Height      Head Circumference      Peak Flow      Pain Score      Pain Loc      Pain Education      Exclude from Growth Chart    No data found.  Updated Vital Signs BP 128/76 (BP Location: Right Arm) Comment (BP Location): large cuff  Pulse 76   Temp 97.6 F (36.4 C) (Oral)   Resp 20   SpO2 95%  Visual Acuity Right Eye Distance:   Left Eye Distance:   Bilateral Distance:    Right Eye Near:   Left Eye Near:    Bilateral Near:     Physical Exam Vitals reviewed.  Constitutional:      General: He is not in acute distress.    Appearance: He is not toxic-appearing.  Musculoskeletal:      Comments: There is pedal edema of both lower extremities, more so in the left.  There is an open wound without raised edges that is about 2.5 cm in diameter.  Please see photos  There is also an open wound that is smaller in about 1 x 1.5 cm on his lower left leg on the posterior leg near the Achilles tendon.  Skin:    Coloration: Skin is not pale.     Comments: There is dusky erythema of the entire left lower leg.  Neurological:     General: No focal deficit present.     Mental Status: He is alert and oriented to person, place, and time.  Psychiatric:        Behavior: Behavior normal.        UC Treatments / Results  Labs (all labs ordered are listed, but only abnormal results are displayed) Labs Reviewed - No data to display  EKG   Radiology No results found.  Procedures Procedures (including critical care time)  Medications Ordered in UC Medications - No data to display  Initial Impression / Assessment and Plan / UC Course  I have reviewed the triage vital signs and the nursing notes.  Pertinent labs & imaging results that were available during my care of the patient were reviewed by me and considered in my medical decision making (see chart for details).     Staff will bandage leg with bacitracin .  I have sent Bactroban  to the pharmacy for him to do wound care in the short-term.  I have asked him to please reschedule with his primary care so that they can see him and evaluate him for treatment for his venous stasis  Keflex  is sent in to treat possible cellulitis.  Final diagnoses:  Venous stasis ulcer of other part of left lower leg limited to breakdown of skin without varicose veins (HCC)     Discharge Instructions      Cephalexin  500 mg --1 tablet by mouth 2 times daily for 7 days.  Wash the wounds twice daily with soapy water and then put new antibiotic ointment on  Put mupirocin  ointment on the sore areas twice daily until improved  Please follow-up  with your primary care about this issue        ED Prescriptions     Medication Sig Dispense Auth. Provider   cephALEXin  (KEFLEX ) 500 MG capsule Take 1 capsule (500 mg total) by mouth 2 (two) times daily for 7 days. 14 capsule Breiana Stratmann K, MD   mupirocin  ointment (BACTROBAN ) 2 % Apply 1 Application topically 2 (two) times daily. To affected area till better 22 g Vonna Sharlet POUR, MD      PDMP not reviewed this encounter.   Vonna Sharlet POUR, MD 10/26/23 1755    Vonna Sharlet POUR, MD 10/26/23 [phone]

## 2023-10-26 NOTE — ED Triage Notes (Addendum)
 Patient has neuropathy in lower extremities.  Patient has 2 wounds to left lower leg.  Patient refers to burns, but he does not remember getting any burn.    Patient noticed blisters last week Patient reports noticing wound weeping today.  Patient was supposed to see pcp today, but did not have any transportation.  Patient has been using a cream to this leg.  There is a wound to back of lower leg and left anterior lower leg.  Patient does have bilateral ankle/lower leg edema

## 2023-10-27 ENCOUNTER — Telehealth: Payer: Self-pay | Admitting: *Deleted

## 2023-10-27 NOTE — Telephone Encounter (Signed)
 Copied from CRM [id number]. Topic: Clinical - Medical Advice >> Oct 27, 2023  3:31 PM [place] A wrote: Reason for CRM: Patient would like for Nurse to call him regarding his trip to the ED yesterday at [facility] for his leg.please call   Patient need an appt F/U ED visit with PCP [facility]

## 2023-10-27 NOTE — Progress Notes (Signed)
 Complex Care Management Care Guide Note  10/27/2023 Name: [name] MRN: [id number] DOB: [birth year]  [name] [name] is a 68 y.o. year old male who is a primary care patient of Kennyth Worth HERO, MD and is actively engaged with the care management team. I reached out to Fairy LITTIE Gay by phone today to assist with re-scheduling  with the RN Case Manager.  Follow up plan: Unsuccessful telephone outreach attempt made. A HIPAA compliant phone message was left for the patient providing contact information and requesting a return call. No further outreach attempts will be made due to inability to maintain patient contact.   Thedford Franks, CMA [place]  [facility], [facility] Guide Direct Dial: [phone]  Fax: [phone] Website: [email or web address]

## 2023-10-31 NOTE — Progress Notes (Unsigned)
 Patient ID: [name] [name] MRN: [id number] DOB/AGE: 03/30/1955 68 y.o.  Primary Care Physician:Parker, Worth HERO, [name] [name] AHF Cardiologist: Gardenia  CC: Tricuspid valvular disease management     FOCUSED PROBLEM LIST:   Tricuspid regurgitation Severe likely atrial functional TR, normal RV function with severe RV enlargement TTE June 2025 Tri score 3 out of 12; 5% in-hospital mortality (low risk for isolated TV surgery) Echo coupling index (TAPSE/PASP)= 16/43.7=0.36 (favorable >0.40) Invasive coupling index (TAPSE/PASP) =16/41=0.39 (favorable > 0.40) Large V waves RA tracing, PVR 1.4-1.8 WU, PA pulsatility index greater than 3 RHC 2024 Coronary artery disease CABG 2005 LIMA to LAD, radial to OM 2, VG to PDA, VG to ramus lateral branch sequential to ramus medial branch Obstructive sleep apnea T2DM On insulin  Intolerant of SGLT2 inhibitors Hypertension Hyperlipidemia Aortic atherosclerosis Chest CT 2024 Ectopic atrial tachycardia PE March 2024 On apixaban  5 mg twice daily CKD stage IIIa BMI 39  September 2025:  Patient consents to use of AI scribe. The patient is 68 year old male with above listed medical problems referred for recommendations regarding the patient's severe tricuspid regurgitation in the context of RV dilatation with preserved RV function.  The patient has been followed by Dr. Gardenia for some time.  He underwent right heart catheterization in 2024 which demonstrated a PVR of less than 3 Woods units with elevated V waves on right atrial pressure tracing.  Patient's cardiac output and index were preserved.  PA pulsatility index was greater than 3.  His torsemide  20 mg twice daily was increased to 40 mg in the morning and 20 mg at night for pulse of 3 days in April.  He was seen in June and spironolactone  was added to his regimen.  His torsemide  was increased to 40 mg twice daily.  His metoprolol  tartrate was changed to succinate.  He was seen back again in August and  his weight had increased by 10 pounds.  He screen failed for the aqua pass trial       Past Medical History:  Diagnosis Date   Acute myocardial infarction, unspecified site, episode of care unspecified 2005   Acute pancreatitis    CAD (coronary artery disease)    a. CABG in 2005 w LIMA to LAD, left radial to second circumflex marginal, saphenous vein graft to PDA, saphenous vein graft to lateral subbrach of ramus intermediate, and sequential saphenous vein graft to the medial subbranch of ramus intermediate.   Chronic diastolic CHF (congestive heart failure) (HCC)    Cirrhosis of liver without mention of alcohol    CKD (chronic kidney disease), stage II    Complications affecting other specified body systems, hypertension    Ectopic atrial rhythm    Essential hypertension    Hyperlipidemia    Hypokalemia    a. intermitttent noncompliance with potassium supplement.   Hypomagnesemia    Morbid obesity (HCC)    Neuropathy    Other and unspecified hyperlipidemia    Proteinuria    RBBB    Sleep apnea    Type II or unspecified type diabetes mellitus without mention of complication, not stated as uncontrolled    Varicose veins of both lower extremities     Past Surgical History:  Procedure Laterality Date   CORONARY ARTERY BYPASS GRAFT     2005   I & D EXTREMITY Left 09/02/2019   Procedure: IRRIGATION AND DEBRIDEMENT 4th TOE DEBRIDIMENT;  Surgeon: Gretel Ozell PARAS, DPM;  Location: [facility];  Service: Podiatry;  Laterality: Left;  RIGHT HEART CATH N/A 01/09/2023   Procedure: RIGHT HEART CATH;  Surgeon: Gardenia Led, DO;  Location: [facility] INVASIVE CV [facility];  Service: Cardiovascular;  Laterality: N/A;   TEE WITHOUT CARDIOVERSION N/A 04/25/2022   Procedure: TRANSESOPHAGEAL ECHOCARDIOGRAM (TEE);  Surgeon: Pietro Redell RAMAN, [name] [name];  Location: [facility] ENDOSCOPY;  Service: Cardiovascular;  Laterality: N/A;    Family History  Problem Relation Age of Onset   Breast cancer Mother    Prostate cancer  Father    Coronary artery disease Father        underwent CABG    Social History   Socioeconomic History   Marital status: Significant Other    Spouse name: Not on file   Number of children: Not on file   Years of education: Not on file   Highest education level: Not on file  Occupational History   Occupation: Disabled     Comment: previously worked in delivery   Tobacco Use   Smoking status: Former    Current packs/day: 0.00    Average packs/day: 1.5 packs/day for 30.0 years (45.0 ttl pk-yrs)    Types: Cigarettes    Start date: 10/21/1974    Quit date: 10/20/2004    Years since quitting: 19.0    Passive exposure: Never   Smokeless tobacco: Never  Vaping Use   Vaping status: Never Used  Substance and Sexual Activity   Alcohol use: Yes    Comment: occasional   Drug use: No   Sexual activity: Not Currently  Other Topics Concern   Not on file  Social History Narrative   Lives with significant other x 30+ years, apartment friends smoke. Disability. Is disabled secondary to his work-related injury.       Has over 20 grandchildren    Social Drivers of Corporate investment banker Strain: Low Risk  (12/04/2022)   Overall Financial Resource Strain (CARDIA)    Difficulty of Paying Living Expenses: Not hard at all  Food Insecurity: No Food Insecurity (07/17/2023)   Hunger Vital Sign    Worried About Running Out of Food in the Last Year: Never true    Ran Out of Food in the Last Year: Never true  Transportation Needs: No Transportation Needs (10/05/2023)   PRAPARE - Administrator, Civil Service (Medical): No    Lack of Transportation (Non-Medical): No  Physical Activity: Insufficiently Active (03/13/2023)   Exercise Vital Sign    Days of Exercise per Week: 5 days    Minutes of Exercise per Session: 10 min  Stress: No Stress Concern Present (12/04/2022)   [employer] of Occupational Health - Occupational Stress Questionnaire    Feeling of Stress : Not at all   Social Connections: Socially Isolated (02/18/2023)   Social Connection and Isolation Panel    Frequency of Communication with Friends and Family: Three times a week    Frequency of Social Gatherings with Friends and Family: Three times a week    Attends Religious Services: Never    Active Member of Clubs or Organizations: No    Attends Banker Meetings: Never    Marital Status: Never married  Intimate Partner Violence: Not At Risk (07/17/2023)   Humiliation, Afraid, Rape, and Kick questionnaire    Fear of Current or Ex-Partner: No    Emotionally Abused: No    Physically Abused: No    Sexually Abused: No     Prior to Admission medications   Medication Sig Start Date End Date Taking? Authorizing Provider  Accu-Chek Softclix Lancets lancets TEST MORNING, NOON AND BEDTIME 11/17/22   [name] Worth HERO, [name] [name]  albuterol  (ACCUNEB ) 0.63 MG/3ML nebulizer solution Take 1 ampule by nebulization every 6 (six) hours as needed for wheezing.    Provider, Historical, [name] [name]  albuterol  (VENTOLIN  HFA) 108 (90 Base) MCG/ACT inhaler INHALE TWO PUFFS BY MOUTH EVERY SIX HOURS AS NEEDED FOR WHEEZING OR SHORTNESS OF BREATH 02/23/23   Neysa Rama D, [name] [name]  aspirin  EC (ASPIRIN  LOW DOSE) 81 MG tablet TAKE ONE TABLET BY MOUTH EVERY MORNING 05/21/23   Jeffrie Oneil BROCKS, [name] [name]  B-D ULTRAFINE III SHORT PEN 31G X 8 MM MISC USE AS DIRECTED 09/22/22   [name] Worth HERO, [name] [name]  Blood Glucose Monitoring Suppl DEVI 1 each by Does not apply route in the morning, at noon, and at bedtime. May substitute to any manufacturer covered by patient's insurance.Dx E11.69 10/15/22   [name] Worth HERO, [name] [name]  cephALEXin  (KEFLEX ) 500 MG capsule Take 1 capsule (500 mg total) by mouth 2 (two) times daily for 7 days. 10/26/23 11/02/23  Vonna Sharlet POUR, [name] [name]  diclofenac  Sodium (VOLTAREN ) 1 % GEL APPLY 2 GRAMS TOPICALLY 4 TIMES DAILY TO KNEE 05/13/22   [name] Worth HERO, [name] [name]  ELIQUIS  5 MG TABS tablet TAKE ONE TABLET BY MOUTH TWICE DAILY (AM+PM) 10/15/23   [name] Worth HERO,  [name] [name]  famotidine  (PEPCID ) 20 MG tablet Take 1 tablet (20 mg total) by mouth daily. 04/20/23   Jeffrie Oneil BROCKS, [name] [name]  fluticasone  (FLONASE ) 50 MCG/ACT nasal spray Place 1 spray into both nostrils daily. 01/16/23   [name] Worth HERO, [name] [name]  Fluticasone -Umeclidin-Vilant (TRELEGY ELLIPTA ) 100-62.5-25 MCG/ACT AEPB INHALE 1 PUFF INTO THE LUNGS DAILY 01/27/23   Neysa Rama D, [name] [name]  formoterol  (PERFOROMIST ) 20 MCG/2ML nebulizer solution Take 20 mcg by nebulization every 12 (twelve) hours.    Provider, Historical, [name] [name]  glucose blood (ONETOUCH ULTRA) test strip TESTING 8 TIMES DAILY 04/14/23   [name] Worth HERO, [name] [name]  hydroxypropyl methylcellulose / hypromellose (ISOPTO TEARS / GONIOVISC) 2.5 % ophthalmic solution Place 2 drops into both eyes as needed for dry eyes. 04/15/22   [name] Worth HERO, [name] [name]  insulin  glargine (LANTUS ) 100 UNIT/ML injection Inject 24 Units into the skin daily.    Provider, Historical, [name] [name]  insulin  lispro (HUMALOG ) 100 UNIT/ML injection Inject 22 Units into the skin 3 (three) times daily before meals.    Provider, Historical, [name] [name]  lidocaine  (LIDODERM ) 5 % Place 1 patch onto the skin daily. Remove & Discard patch within 12 hours or as directed by [name] [name] 04/17/22   Theadore Ozell HERO, [name] [name]  linaclotide  (LINZESS ) 290 MCG CAPS capsule Take 290 mcg by mouth as needed.    Provider, Historical, [name] [name]  losartan  (COZAAR ) 100 MG tablet Take 1 tablet (100 mg total) by mouth daily. PLEASE SCHEDULE APPOINTMENT FOR MORE REFILLS 09/15/23   Sabharwal, Aditya, DO  magnesium  oxide (MAG-OX) 400 MG tablet Take 1 tablet (400 mg total) by mouth daily. 07/29/23   Jeffrie Oneil BROCKS, [name] [name]  metoprolol  tartrate (LOPRESSOR ) 25 MG tablet TAKE 1/2 TABLET BY MOUTH TWICE A DAY (1/2AM+1/2PM) 09/15/23   [name] Worth HERO, [name] [name]  mupirocin  ointment (BACTROBAN ) 2 % Apply 1 Application topically 2 (two) times daily. To affected area till better 10/26/23   Vonna Sharlet POUR, [name] [name]  nitroGLYCERIN  (NITROSTAT ) 0.4 MG SL tablet Place 1 tablet (0.4 mg total) under the tongue every 5  (five) minutes as needed for chest pain. 10/07/23   Sabharwal, Aditya, DO  potassium chloride  SA (KLOR-CON  M) 20 MEQ tablet  Take 2 tablets (40 mEq total) by mouth 2 (two) times daily. Please call the office at [phone] to schedule an appointment with Dr. Jeffrie for future refills. Thank you. 08/20/23   Jeffrie Oneil BROCKS, [name] [name]  pregabalin  (LYRICA ) 25 MG capsule Take 1 capsule (25 mg total) by mouth 2 (two) times daily. 07/10/23   [name] Worth HERO, [name] [name]  rosuvastatin  (CRESTOR ) 20 MG tablet TAKE 1 TABLET (20 MG TOTAL) BY MOUTH EVERY EVENING. 10/15/23   [name] Worth HERO, [name] [name]  silver  sulfADIAZINE  (SSD) 1 % cream APPLY PEA SIZED AMOUNT TOPICALLY TO WOUND DAILY 07/22/23   [name] Worth HERO, [name] [name]  spironolactone  (ALDACTONE ) 25 MG tablet Take 1 tablet (25 mg total) by mouth daily. 07/21/23   Jeffrie Oneil BROCKS, [name] [name]  torsemide  (DEMADEX ) 20 MG tablet Take 1 tablet (20 mg total) by mouth 2 (two) times daily. 06/18/23   Jeffrie Oneil BROCKS, [name] [name]    Allergies  Allergen Reactions   Empagliflozin  Rash    Patient reports yeast.    Ibuprofen Other (See Comments)    Stomach pain    REVIEW OF SYSTEMS:  General: no fevers/chills/night sweats Eyes: no blurry vision, diplopia, or amaurosis ENT: no sore throat or hearing loss Resp: no cough, wheezing, or hemoptysis CV: no edema or palpitations GI: no abdominal pain, nausea, vomiting, diarrhea, or constipation GU: no dysuria, frequency, or hematuria Skin: no rash Neuro: no headache, numbness, tingling, or weakness of extremities Musculoskeletal: no joint pain or swelling Heme: no bleeding, DVT, or easy bruising Endo: no polydipsia or polyuria  There were no vitals taken for this visit.  PHYSICAL EXAM: GEN:  AO x 3 in no acute distress HEENT: normal Dentition: Normal*** Neck: JVP normal. +2***carotid upstrokes without bruits. No thyromegaly. Lungs: equal expansion, clear bilaterally CV: Apex is discrete and nondisplaced, RRR without murmur or gallop*** Abd: soft, non-tender,  non-distended; no bruit; positive bowel sounds Ext: no edema, ecchymoses, or cyanosis Vascular: 2+ femoral pulses, 2+ radial pulses       Skin: warm and dry without rash Neuro: CN II-XII grossly intact; motor and sensory grossly intact    DATA AND STUDIES:  EKG:  EKG Interpretation Date/Time:    Ventricular Rate:    PR Interval:    QRS Duration:    QT Interval:    QTC Calculation:   R Axis:      Text Interpretation:          CARDIAC STUDIES: Refer to CV Procedures and Imaging Tabs  07/31/2023: Hemoglobin 13.4; Platelets 235 10/07/2023: B Natriuretic Peptide 148.4 10/09/2023: NT-Pro BNP 296 10/22/2023: BUN 21; Creatinine, Ser 1.36; Potassium 3.9; Sodium 133   STS RISK CALCULATOR: Pending  NHYA CLASS: ***     ASSESSMENT AND PLAN:   1. Severe tricuspid regurgitation   2. Chronic right-sided heart failure (HCC)   3. Hx of CABG   4. Type 2 diabetes mellitus with complication, with long-term current use of insulin  (HCC)   5. Hypertension associated with diabetes (HCC)   6. Hyperlipidemia associated with type 2 diabetes mellitus (HCC)   7. Aortic atherosclerosis (HCC)   8. CKD stage 3 due to type 2 diabetes mellitus (HCC)     Tricuspid regurgitation: Reviewed the patient's TEE.  He likely has developed atrial functional tricuspid regurgitation due to atrial tachycardia, obstructive sleep apnea, and perhaps an additional insult from his PE that he sustained in March 2024.  Fortunately while his RV is dilated his systolic function by echo and by right heart catheterization seems to  be preserved.  Unfortunately his invasive and echo derived coupling indexes are mildly low.  This is associated with a slightly less favorable outcome with transcatheter tricuspid edge-to-edge repair. Chronic right-sided heart failure: Not a candidate for SGLT2 inhibitor; continue losartan  100, Lopressor  12.5 twice daily, spironolactone  25 daily, torsemide *** History of CABG: Discontinue aspirin ,  continue Eliquis  5 twice daily, rosuvastatin  20 mg daily*** T2DM: Continue Eliquis  5 twice daily, losartan  100 daily, rosuvastatin  20 daily, defer SGLT2 inhibitor.  Consider GLP-1 receptor agonist. Hypertension: Continue losartan  100 mg daily, Lopressor  12.5 twice daily, spironolactone  25 mg Hyperlipidemia: Continue rosuvastatin  20 mg daily Aortic atherosclerosis: Continue Eliquis  5 mg twice daily, atorvastatin 20 mg daily CKD stage IIIa: Continue losartan  100 mg daily  I have personally reviewed the patients imaging data as summarized above.  I have reviewed the natural history of mitral regurgitation with the patient and family members who are present today. We have discussed the limitations of medical therapy and the poor prognosis associated with symptomatic tricuspid regurgitation. We have also reviewed potential treatment options, including palliative medical therapy, conventional tricuspid surgery, and transcatheter tricuspid edge-to-edge repair. We discussed treatment options in the context of this patient's specific comorbid medical conditions.   All of the patient's questions were answered today. Will make further recommendations based on the results of studies outlined above.   I spent *** minutes reviewing all clinical data during and prior to this visit including all relevant imaging studies, laboratories, clinical information from other health systems and prior notes from both Cardiology and other specialties, interviewing the patient, conducting a complete physical examination, and coordinating care in order to formulate a comprehensive and personalized evaluation and treatment plan.   Maizey Menendez K Seiji Wiswell, [name] [name]  10/31/2023 7:08 PM    [facility] [address] [place], [place], [place]  [address] Phone: [phone]; Fax: [phone]

## 2023-11-03 ENCOUNTER — Ambulatory Visit: Attending: Internal Medicine | Admitting: Internal Medicine

## 2023-11-03 ENCOUNTER — Inpatient Hospital Stay: Admitting: Family Medicine

## 2023-11-03 ENCOUNTER — Encounter: Payer: Self-pay | Admitting: Internal Medicine

## 2023-11-03 VITALS — BP 104/58 | HR 83 | Ht 71.0 in | Wt 295.4 lb

## 2023-11-03 DIAGNOSIS — E1122 Type 2 diabetes mellitus with diabetic chronic kidney disease: Secondary | ICD-10-CM

## 2023-11-03 DIAGNOSIS — I4719 Other supraventricular tachycardia: Secondary | ICD-10-CM | POA: Diagnosis not present

## 2023-11-03 DIAGNOSIS — I50812 Chronic right heart failure: Secondary | ICD-10-CM

## 2023-11-03 DIAGNOSIS — E785 Hyperlipidemia, unspecified: Secondary | ICD-10-CM

## 2023-11-03 DIAGNOSIS — E1159 Type 2 diabetes mellitus with other circulatory complications: Secondary | ICD-10-CM

## 2023-11-03 DIAGNOSIS — I152 Hypertension secondary to endocrine disorders: Secondary | ICD-10-CM

## 2023-11-03 DIAGNOSIS — Z951 Presence of aortocoronary bypass graft: Secondary | ICD-10-CM

## 2023-11-03 DIAGNOSIS — I2699 Other pulmonary embolism without acute cor pulmonale: Secondary | ICD-10-CM

## 2023-11-03 DIAGNOSIS — E1169 Type 2 diabetes mellitus with other specified complication: Secondary | ICD-10-CM

## 2023-11-03 DIAGNOSIS — E118 Type 2 diabetes mellitus with unspecified complications: Secondary | ICD-10-CM

## 2023-11-03 DIAGNOSIS — I071 Rheumatic tricuspid insufficiency: Secondary | ICD-10-CM | POA: Diagnosis not present

## 2023-11-03 DIAGNOSIS — Z794 Long term (current) use of insulin: Secondary | ICD-10-CM | POA: Diagnosis not present

## 2023-11-03 DIAGNOSIS — I700 Atherosclerosis of aorta: Secondary | ICD-10-CM | POA: Diagnosis not present

## 2023-11-03 DIAGNOSIS — N183 Chronic kidney disease, stage 3 unspecified: Secondary | ICD-10-CM

## 2023-11-03 MED ORDER — TORSEMIDE 20 MG PO TABS: ORAL_TABLET | ORAL | 3 refills | Status: DC

## 2023-11-03 NOTE — Patient Instructions (Addendum)
 Medication Instructions:  STOP Aspirin   CHANGE how you take Torsemide  *Alternate each day taking 20 mg twice daily and then 40 mg twice daily the next day. Continue this regimen. *If you need a refill on your cardiac medications before your next appointment, please call your pharmacy*  Lab Work: To be completed in 1 week: BMP  If you have labs (blood work) drawn today and your tests are completely normal, you will receive your results only by: MyChart Message (if you have MyChart) OR A paper copy in the mail If you have any lab test that is abnormal or we need to change your treatment, we will call you to review the results.  Testing/Procedures: Your physician has requested that you have an echocardiogram. Echocardiography is a painless test that uses sound waves to create images of your heart. It provides your doctor with information about the size and shape of your heart and how well your heart's chambers and valves are working. This procedure takes approximately one hour. There are no restrictions for this procedure. Please do NOT wear cologne, perfume, aftershave, or lotions (deodorant is allowed). Please arrive 15 minutes prior to your appointment time.  Please note: We ask at that you not bring children with you during ultrasound (echo/ vascular) testing. Due to room size and safety concerns, children are not allowed in the ultrasound rooms during exams. Our front office staff cannot provide observation of children in our lobby area while testing is being conducted. An adult accompanying a patient to their appointment will only be allowed in the ultrasound room at the discretion of the ultrasound technician under special circumstances. We apologize for any inconvenience.   Follow-Up: At [facility], you and your health needs are our priority.  As part of our continuing mission to provide you with exceptional heart care, our providers are all part of one team.  This team  includes your primary Cardiologist (physician) and Advanced Practice Providers or APPs (Physician Assistants and Nurse Practitioners) who all work together to provide you with the care you need, when you need it.  Your next appointment:   6 month(s)  Provider:   Arun Thukkani, MD

## 2023-11-06 ENCOUNTER — Encounter: Payer: Self-pay | Admitting: Family Medicine

## 2023-11-06 ENCOUNTER — Ambulatory Visit: Admitting: Family Medicine

## 2023-11-06 VITALS — BP 102/63 | HR 72 | Temp 97.3°F | Ht 71.0 in | Wt 295.6 lb

## 2023-11-06 DIAGNOSIS — G63 Polyneuropathy in diseases classified elsewhere: Secondary | ICD-10-CM

## 2023-11-06 DIAGNOSIS — K219 Gastro-esophageal reflux disease without esophagitis: Secondary | ICD-10-CM | POA: Diagnosis not present

## 2023-11-06 DIAGNOSIS — Z23 Encounter for immunization: Secondary | ICD-10-CM | POA: Diagnosis not present

## 2023-11-06 DIAGNOSIS — Z794 Long term (current) use of insulin: Secondary | ICD-10-CM

## 2023-11-06 DIAGNOSIS — L0390 Cellulitis, unspecified: Secondary | ICD-10-CM

## 2023-11-06 DIAGNOSIS — I10 Essential (primary) hypertension: Secondary | ICD-10-CM

## 2023-11-06 DIAGNOSIS — I872 Venous insufficiency (chronic) (peripheral): Secondary | ICD-10-CM | POA: Diagnosis not present

## 2023-11-06 DIAGNOSIS — M1990 Unspecified osteoarthritis, unspecified site: Secondary | ICD-10-CM

## 2023-11-06 DIAGNOSIS — S81802D Unspecified open wound, left lower leg, subsequent encounter: Secondary | ICD-10-CM | POA: Diagnosis not present

## 2023-11-06 DIAGNOSIS — R5381 Other malaise: Secondary | ICD-10-CM | POA: Diagnosis not present

## 2023-11-06 DIAGNOSIS — E1169 Type 2 diabetes mellitus with other specified complication: Secondary | ICD-10-CM

## 2023-11-06 MED ORDER — LIDOCAINE 5 % EX PTCH: 1.0000 | MEDICATED_PATCH | CUTANEOUS | 0 refills | Status: AC

## 2023-11-06 MED ORDER — FAMOTIDINE 20 MG PO TABS: 20.0000 mg | ORAL_TABLET | Freq: Every day | ORAL | 2 refills | Status: DC

## 2023-11-06 MED ORDER — CEPHALEXIN 500 MG PO CAPS: 500.0000 mg | ORAL_CAPSULE | Freq: Four times a day (QID) | ORAL | 0 refills | Status: AC

## 2023-11-06 MED ORDER — TRAMADOL HCL 50 MG PO TABS: 50.0000 mg | ORAL_TABLET | Freq: Three times a day (TID) | ORAL | 0 refills | Status: AC | PRN

## 2023-11-06 NOTE — Assessment & Plan Note (Signed)
 Stable on famotidine  twice daily.  Will refill today.

## 2023-11-06 NOTE — Assessment & Plan Note (Signed)
 Blood pressure at goal today on regimen per cardiology with metoprolol  tartrate 12.5 mg twice daily and spironolactone  12.5 mg daily.

## 2023-11-06 NOTE — Assessment & Plan Note (Signed)
 Follows with endocrinology ?

## 2023-11-06 NOTE — Assessment & Plan Note (Signed)
 Recently saw cardiology.  They upped his torsemide  and he has had some improvement with his chronic bilateral lower extremity edema.  We discussed that this is very important we continue to manage his lower extremity edema as this is causing his frequent leg ulcers with subsequent cellulitis.  He has been consistent with taking his torsemide  and has had some improvement thus far.  Due to his frequent lower extremity ulcers and cellulitis would be reasonable for him to see wound care for ongoing management to prevent complications.  He is unable to go to the wound care clinic due to difficulty with ambulation and transportation.  Will place order home health as above.

## 2023-11-06 NOTE — Assessment & Plan Note (Signed)
 Patient uses tramadol  intermittently as needed.  Last refill for this is about 3 months or so ago.  Cannot take NSAIDs due to cardiac issues and being anticoagulated.  Database was reviewed without red flags.  Will refill tramadol  today.

## 2023-11-06 NOTE — Assessment & Plan Note (Signed)
 Multifactorial in setting of CHF and diabetic neuropathy.  Additionally has transportation issues.  Will be placing referral to home health as above.

## 2023-11-06 NOTE — Assessment & Plan Note (Signed)
 On Lyrica  25 mg twice daily.  Will be refilling his tramadol  as above today as well.

## 2023-11-06 NOTE — Progress Notes (Signed)
 [name] [name] is a 68 y.o. male who presents today for an office visit.  Assessment/Plan:  New/Acute Problems: Cellulitis  No red flags.  No signs of systemic illness.  Still has a small amount of erythema and warmth around his to lower extremity ulcers on his left leg.  Will extend course of Keflex  for another 10 days.  He has been doing well.  We discussed wound care and wound management.  Will place referral to home health for ongoing wound care.  He is not interested in going to wound care clinic at this time.  We discussed reasons to return to care and seek emergent care.  We discussed the importance of reducing lower extremity edema as below.  Chronic Problems Addressed Today: Stasis dermatitis Recently saw cardiology.  They upped his torsemide  and he has had some improvement with his chronic bilateral lower extremity edema.  We discussed that this is very important we continue to manage his lower extremity edema as this is causing his frequent leg ulcers with subsequent cellulitis.  He has been consistent with taking his torsemide  and has had some improvement thus far.  Due to his frequent lower extremity ulcers and cellulitis would be reasonable for him to see wound care for ongoing management to prevent complications.  He is unable to go to the wound care clinic due to difficulty with ambulation and transportation.  Will place order home health as above.  Type 2 diabetes mellitus with other specified complication (HCC), followed by Dr. Faythe Abide with endocrinology.  Debility Multifactorial in setting of CHF and diabetic neuropathy.  Additionally has transportation issues.  Will be placing referral to home health as above.  GERD (gastroesophageal reflux disease) Stable on famotidine  twice daily.  Will refill today.  HTN (hypertension) Blood pressure at goal today on regimen per cardiology with metoprolol  tartrate 12.5 mg twice daily and spironolactone  12.5 mg  daily.  Osteoarthritis Patient uses tramadol  intermittently as needed.  Last refill for this is about 3 months or so ago.  Cannot take NSAIDs due to cardiac issues and being anticoagulated.  Database was reviewed without red flags.  Will refill tramadol  today.  Polyneuropathy associated with underlying disease On Lyrica  25 mg twice daily.  Will be refilling his tramadol  as above today as well.  Flu vaccine given today.     Subjective:  HPI:  See assessment / plan for status of chronic conditions.  Patient is here today for urgent care follow-up.  Went to urgent care 11 days ago with a wound on his lower leg.  He had a few open wounds and blisters as well.  He was started on topical bacitracin  and Keflex .  Discussed the use of AI scribe software for clinical note transcription with the patient, who gave verbal consent to proceed.  History of Present Illness [name] [name] is a 68 year old male who presents with chronic leg swelling and recent leg wounds.  He presents for follow-up after visiting urgent care eleven days ago for a wound on his lower leg. At that time, he had multiple open wounds and blisters. He was started on topical vasopressin and Keflex . He reports that he finished the antibiotics and that the infection cleared up, but he continues to monitor for warmth in the area. He is currently using Bactrimab cream on his wounds and has been applying a large bandage, which he finds tight. He continues to have ulcers on both legs.  He has been experiencing swelling  in his legs, which he attributes to fluid retention. He is currently on torsemide , taking one pill in the morning and one in the evening every other day, and two pills in the morning and two in the evening on alternate days. The medication helps with the swelling, but he still struggles to put on a sock due to the bandage and swelling. He saw a cardiologist three days ago, who is preparing him for a procedure to address  a leaky valve on the right side of his heart. His weight has decreased from 295 to 291 pounds, and he aims to reach 286 pounds.  He describes experiencing pain from what he suspects might be varicose veins, with pain radiating down to the top of his foot. He also mentions having spurs that cause discomfort. He requests refills for Pepcid , lidocaine , and tramadol , which he uses for pain management. He also mentions having reflux issues and is on famotidine  for this condition.  He does not have transportation currently as his truck is broken down.         Objective:  Physical Exam: BP 102/63   Pulse 72   Temp (!) 97.3 F (36.3 C) (Temporal)   Ht 5' 11 (1.803 m)   Wt 295 lb 9.6 oz (134.1 kg)   SpO2 97%   BMI 41.23 kg/m   Wt Readings from Last 3 Encounters:  11/06/23 295 lb 9.6 oz (134.1 kg)  11/03/23 295 lb 6.4 oz (134 kg)  10/07/23 (!) 304 lb (137.9 kg)    Gen: No acute distress, resting comfortably Skin: 2 shallow ulcers on left lower extremity approximately 2 to 3 cm in diameter on left anterior leg and right posterior lower leg.  Small amount of surrounding erythema.  Warm to touch.  No purulent drainage present.  Neuro vastly intact distally. MUSCULOSKELETAL: Venous stasis changes noted bilaterally in legs with 3+ edema to knees bilaterally. Neuro: Grossly normal, moves all extremities Psych: Normal affect and thought content  Time Spent: 45 minutes of total time was spent on the date of the encounter performing the following actions: chart review prior to seeing the patient including recent urgent care visit, obtaining history, performing a medically necessary exam, counseling on the treatment plan, placing orders, and documenting in our EHR.        Worth HERO. Kennyth, MD 11/06/2023 11:30 AM

## 2023-11-06 NOTE — Patient Instructions (Signed)
 It was very nice to see you today!  VISIT SUMMARY: You came in for a follow-up visit regarding your chronic leg swelling and recent leg wounds. We discussed your ongoing issues with leg ulcers, swelling, and pain, as well as your heart condition and reflux disease.  YOUR PLAN: CHRONIC LOWER EXTREMITY WOUNDS WITH CELLULITIS, VENOUS INSUFFICIENCY, AND EDEMA: You have chronic wounds on your legs with signs of infection and swelling. -We will prescribe another week of antibiotics to address the infection. -A home health nurse will visit you to provide wound care. -We applied a new bandage and provided tape for you to use.  HEART FAILURE WITH LOWER EXTREMITY EDEMA: You have heart failure which is causing swelling in your legs. -Continue taking torsemide  as prescribed to manage the swelling. -Monitor your weight and fluid status regularly.  CHRONIC PAIN: You are experiencing chronic pain, which is being managed with tramadol . -We have refilled your tramadol  prescription.  GASTROESOPHAGEAL REFLUX DISEASE (GERD): You have reflux issues that are being managed with famotidine . -We have refilled your famotidine  prescription.  Return if symptoms worsen or fail to improve.   Take care, Dr Kennyth  PLEASE NOTE:  If you had any lab tests, please let us  know if you have not heard back within a few days. You may see your results on mychart before we have a chance to review them but we will give you a call once they are reviewed by us .   If we ordered any referrals today, please let us  know if you have not heard from their office within the next week.   If you had any urgent prescriptions sent in today, please check with the pharmacy within an hour of our visit to make sure the prescription was transmitted appropriately.   Please try these tips to maintain a healthy lifestyle:  Eat at least 3 REAL meals and 1-2 snacks per day.  Aim for no more than 5 hours between eating.  If you eat breakfast,  please do so within one hour of getting up.   Each meal should contain half fruits/vegetables, one quarter protein, and one quarter carbs (no bigger than a computer mouse)  Cut down on sweet beverages. This includes juice, soda, and sweet tea.   Drink at least 1 glass of water with each meal and aim for at least 8 glasses per day  Exercise at least 150 minutes every week.

## 2023-11-10 ENCOUNTER — Other Ambulatory Visit (HOSPITAL_COMMUNITY): Payer: Self-pay | Admitting: Cardiology

## 2023-11-11 ENCOUNTER — Telehealth (HOSPITAL_COMMUNITY): Payer: Self-pay | Admitting: Cardiology

## 2023-11-11 ENCOUNTER — Telehealth (HOSPITAL_COMMUNITY): Payer: Self-pay | Admitting: Licensed Clinical Social Worker

## 2023-11-11 NOTE — Telephone Encounter (Signed)
 Called to confirm/remind patient of their appointment at [facility] on 11/11/2023.   Appointment:   [x] Confirmed  [] Left mess   [] No answer/No voice mail  [] VM Full/unable to leave message  [] Phone not in service  Patient reminded to bring all medications and/or complete list.  Confirmed patient has transportation. Gave directions, instructed to utilize valet parking.

## 2023-11-11 NOTE — Telephone Encounter (Signed)
 CSW called to to discuss transportation barriers.  Pt reports he has no ride tomorrow but states he cannot get in and out of a vehicle reliably by himself- states sometimes he needs help getting his legs in and out of the car.  Pt is agreeable to rescheduling appt so he can set up medical transport through his insurance- forwarded call to scheduler.  Andriette HILARIO Leech, LCSW Clinical Social Worker [facility] Desk#: [phone] Cell#: [phone]

## 2023-11-12 ENCOUNTER — Encounter (HOSPITAL_COMMUNITY): Admitting: Cardiology

## 2023-11-17 ENCOUNTER — Ambulatory Visit: Payer: Self-pay

## 2023-11-17 ENCOUNTER — Other Ambulatory Visit: Payer: Self-pay | Admitting: Cardiology

## 2023-11-17 NOTE — Telephone Encounter (Signed)
 FYI Only or Action Required?: Action required by provider: update on patient condition. Patient requesting Tramadol  and Mupirocin  refill prior to 10/27 FU for painful healing wound  Patient was last seen in primary care on 11/06/2023 by Kennyth Worth HERO, MD.  Called Nurse Triage reporting Leg Pain.  Symptoms began several weeks ago.  Interventions attempted: Prescription medications: mupirocin ,tramadol , abx.  Symptoms are: gradually improving.  Triage Disposition: See PCP When Office is Open (Within 3 Days)  Patient/caregiver understands and will follow disposition?: No Reason for Disposition  [1] MODERATE pain (e.g., interferes with normal activities, limping) AND [2] present > 3 days  Answer Assessment - Initial Assessment Questions Patient with healing left lower leg ulcer that was evaluated on 9/26. Patient states it is making progress but is still painful and asking for refill of Mupirocin  and Tramadol .  Patient has follow up with Dr Kennyth at the end of October to follow up on wound. Denies any new symptoms, fever, SOB, or CP.   Call back/ED precautions given and understood.   1. ONSET: When did the pain start?      Prior to 9/26 appt  2. LOCATION: Where is the pain located?      Left lower leg radiating around the ankle 3. PAIN: How bad is the pain?    (Scale 1-10; or mild, moderate, severe)     8/10- shooting pain  5. CAUSE: What do you think is causing the leg pain?     Healing wound- pt with increased activity  6. OTHER SYMPTOMS: Do you have any other symptoms? (e.g., chest pain, back pain, breathing difficulty, swelling, rash, fever, numbness, weakness)     denies  Protocols used: Leg Pain-A-AH

## 2023-11-17 NOTE — Telephone Encounter (Signed)
 Copied from CRM #[id number]. Topic: Clinical - Medication Question >> Nov 17, 2023  3:49 PM Tysheama G wrote: Reason for CRM: Patient is asking if Dr.Parker can prescribe him some more of mupirocin  ointment (BACTROBAN ) 2 % and traMADol  (ULTRAM ) 50 MG tablet

## 2023-11-18 NOTE — Telephone Encounter (Signed)
 See note

## 2023-11-19 ENCOUNTER — Other Ambulatory Visit: Payer: Self-pay | Admitting: Family Medicine

## 2023-11-19 ENCOUNTER — Telehealth: Payer: Self-pay | Admitting: Pharmacy Technician

## 2023-11-19 MED ORDER — MUPIROCIN 2 % EX OINT: 1.0000 | TOPICAL_OINTMENT | Freq: Two times a day (BID) | CUTANEOUS | 0 refills | Status: AC

## 2023-11-19 MED ORDER — TRAMADOL HCL 50 MG PO TABS: 50.0000 mg | ORAL_TABLET | Freq: Three times a day (TID) | ORAL | 0 refills | Status: AC | PRN

## 2023-11-19 NOTE — Progress Notes (Signed)
   11/19/2023  Patient ID: [name] [name], male   DOB: January 17, 1956, 68 y.o.   MRN: [id number]  Patient engaged with clinical pharmacist for management of diabetes on 04/27/2023. Outreach by [employer] technician was requested.   Outreached patient to discuss diabetes medication management. Left voicemail for patient to return my call at their convenience.    Kawhi Diebold, CPhT [facility] Office: [phone] Email: [email or web address]

## 2023-11-19 NOTE — Telephone Encounter (Signed)
 Prescription sent in.  Worth HERO. Kennyth, MD 11/19/2023 8:49 AM

## 2023-11-23 ENCOUNTER — Telehealth: Payer: Self-pay | Admitting: Pharmacy Technician

## 2023-11-23 NOTE — Progress Notes (Signed)
   11/23/2023 Name: [name] [name] MRN: [id number] DOB: 01-28-1956  Patient is appearing on a report for [employer] [place] Metric Diabetes and last engaged with the clinical pharmacist to discuss diabetes on 04/27/2023. Contacted patient today to discuss diabetes management and completed medication review.   Diabetes Plan from last clinical pharmacist appointment:  Called patient to discuss blood glucose readings. We received letter with most recent A1c from his endocrinology office - Dr Faythe with [facility].  His A1c was 12% 04/16/2023 which is an increase compared to 8.4% 10/2022.  Patient states he has not discussed with Dr Faythe yet, he was planning to call them today to get results.  He is taking insulin  glargine 22 units daily and insulin  lispro per sliding scale 18 units _ correction based on blood glucose.  Patient tried Continuous Glucose Monitor several years ago but had difficulty getting sensor to stick.   Encouraged patient to contact endo to review labs and discuss any recommended medication changes.  Also discussed newer Continuous Glucose sensors that are smaller and might stick better than in the past.  Patient considering retrial of Continuous Glucose Monitor. He will call me after he talks with Dr Faythe.    Medication Adherence Barriers Identified:  Patient made recommended medication changes per plan: Patient is followed by Dr Faythe at [facility]. Patient informs he was unable to make his appointment to Dr Faythe last week on 11/19/23 and is calling them to reschedule. He reports taking Lantus  daily and Humalog  sliding scale. He was not sure of the doses but thinks it is Lantus  22 units daily and Humalog  as sliding scale Access issues with any new medication or testing device: Yes He reports leaving his Dexcom reader in an [place] but when he called the driver back they were unable to find it. Per Dr Annemarie fill history, Lantus  last filled for 45ml (90 day supply) on 08/21/23, Humalog   last filled for 90ml (100 day supply) on 10/20/23 and a quantity of 9 Dexcom sensors were also filled on 10/20/2023. Patient is checking blood sugars as prescribed: Yes Patient informs since he does not currently have a Dexcom reader, he is doing fingerstick checks. He informs he checks blood sugar multiple times per day. He did not have access to his readings at the time of the call but reports readings as low as 125 sometimes but mainly in the 188 range with it sometimes going up to 300 something during the day after eating.  Patient informs his leg wound is healing nicely. He informs he will be taking his last dose of antibiotics today. Patient informs he has a cardiology appointment next week and they have been making adjustments to his Torsemide .   Medication Adherence Barriers Addressed/Actions Taken: Reviewed medication changes per plan from last clinical pharmacist note Medication Access for Needs a Dexcom reader Will discuss medication access concerns with pharmacist Educated patient to contact pharmacy regarding new prescriptions Reviewed instructions for monitoring blood sugars at home and reminded patient to keep a written log to review with pharmacist Reminded patient of date/time of upcoming clinical pharmacist follow up and any upcoming PCP/specialists visits. Patient denies transportation barriers to the appointment. No  Next clinical pharmacist appointment is scheduled for: 12/17/2023  Tanja Gift, CPhT [facility] Office: [phone] Email: [email or web address]

## 2023-11-25 NOTE — Progress Notes (Signed)
 LYNDELL ALLAIRE                                          MRN: [id number]   11/25/2023   The VBCI Quality Team Specialist reviewed this patient medical record for the purposes of chart review for care gap closure. The following were reviewed: chart review for care gap closure-glycemic status assessment and kidney health evaluation for diabetes:eGFR  and uACR.    VBCI Quality Team

## 2023-11-30 ENCOUNTER — Ambulatory Visit (HOSPITAL_COMMUNITY)
Admission: RE | Admit: 2023-11-30 | Discharge: 2023-11-30 | Disposition: A | Discharge status: Home / Self Care | Source: Ambulatory Visit | Attending: Cardiology | Admitting: Cardiology

## 2023-11-30 ENCOUNTER — Ambulatory Visit: Payer: Self-pay | Admitting: Internal Medicine

## 2023-11-30 DIAGNOSIS — I361 Nonrheumatic tricuspid (valve) insufficiency: Secondary | ICD-10-CM

## 2023-11-30 DIAGNOSIS — E1159 Type 2 diabetes mellitus with other circulatory complications: Secondary | ICD-10-CM | POA: Diagnosis not present

## 2023-11-30 DIAGNOSIS — I071 Rheumatic tricuspid insufficiency: Secondary | ICD-10-CM | POA: Insufficient documentation

## 2023-11-30 DIAGNOSIS — I50812 Chronic right heart failure: Secondary | ICD-10-CM | POA: Insufficient documentation

## 2023-11-30 DIAGNOSIS — I152 Hypertension secondary to endocrine disorders: Secondary | ICD-10-CM | POA: Diagnosis not present

## 2023-11-30 LAB — ECHOCARDIOGRAM COMPLETE
AR max vel: 2.24 cm2
AV Area VTI: 2.2 cm2
AV Area mean vel: 2.45 cm2
AV Mean grad: 5 mmHg
AV Peak grad: 8.8 mmHg
Ao pk vel: 1.48 m/s
Area-P 1/2: 4.74 cm2
S' Lateral: 2.42 cm

## 2023-11-30 LAB — BASIC METABOLIC PANEL WITH GFR
BUN/Creatinine Ratio: 16 (ref 10–24)
BUN: 21 mg/dL (ref 8–27)
CO2: 23 mmol/L (ref 20–29)
Calcium: 9.5 mg/dL (ref 8.6–10.2)
Chloride: 96 mmol/L (ref 96–106)
Creatinine, Ser: 1.35 mg/dL — ABNORMAL HIGH (ref 0.76–1.27)
Glucose: 219 mg/dL — ABNORMAL HIGH (ref 70–99)
Potassium: 4.2 mmol/L (ref 3.5–5.2)
Sodium: 136 mmol/L (ref 134–144)
eGFR: 57 mL/min/1.73 — ABNORMAL LOW (ref >59)

## 2023-12-02 ENCOUNTER — Other Ambulatory Visit: Payer: Self-pay | Admitting: Family Medicine

## 2023-12-07 ENCOUNTER — Encounter: Admitting: Family Medicine

## 2023-12-08 ENCOUNTER — Other Ambulatory Visit: Payer: Self-pay | Admitting: Cardiology

## 2023-12-08 ENCOUNTER — Other Ambulatory Visit: Payer: Self-pay | Admitting: Family Medicine

## 2023-12-08 ENCOUNTER — Telehealth: Payer: Self-pay

## 2023-12-08 DIAGNOSIS — E782 Mixed hyperlipidemia: Secondary | ICD-10-CM

## 2023-12-08 MED ORDER — ALBUTEROL SULFATE 0.63 MG/3ML IN NEBU: 1.0000 | INHALATION_SOLUTION | Freq: Four times a day (QID) | RESPIRATORY_TRACT | 3 refills | Status: DC | PRN

## 2023-12-08 NOTE — Telephone Encounter (Signed)
 Copied from CRM #[id number]. Topic: Clinical - Prescription Issue >> Dec 07, 2023  2:14 PM Celestine FALCON wrote: Reason for CRM: Melissa with [facility] [place], [place] - [address]. Was calling on behalf of the pt regarding a prescription sent over on 12/03/2023 for  formoterol  (PERFOROMIST ) 20 MCG/2ML nebulizer solution & albuterol  (ACCUNEB ) 0.63 MG/3ML nebulizer solution. She is requesting an update for the pt and wanted a call back at [phone] fax [phone].   ATC x1. Went straight to vm & unable to leave message.  RX sent to pharmacy.

## 2023-12-09 ENCOUNTER — Telehealth (HOSPITAL_COMMUNITY): Payer: Self-pay

## 2023-12-09 NOTE — Progress Notes (Signed)
 [facility] NOTE  Referring Physician: Kennyth Worth HERO, MD  Primary Care: Kennyth Worth HERO, MD Primary Cardiologist: Dr. Jeffrie  CC: Severe tricuspid regurgitation  HPI: [name] [name] is a 68 y.o. male with coronary artery disease, ectopic atrial tachycardia, uncontrolled hypertension, hyperlipidemia, obstructive sleep apnea, history of pulmonary embolism and severe RV enlargement with severe tricuspid regurgitation presenting today for follow up.   He was admitted in 3/24 with acute hypoxic respiratory failure in the setting of RV failure, strep bacteremia and new onset severe tricuspid regurgitation with a VQ scan suggestive for pulmonary embolism.   Today he returns for HF follow up.Overall feeling fine. Denies SOB/PND/Orthopnea. Appetite ok. No fever or chills. Weight at home  pounds. Taking all medications   PHYSICAL EXAM: There were no vitals filed for this visit.  General:   No resp difficulty Neck: no JVD.  Cor: Regular rate & rhythm.  Lungs: clear Abdomen: soft, nontender, nondistended.  Extremities: no  edema Neuro: alert & oriented x3   DATA REVIEW  ECG: Normal sinus rhythm  As per my personal interpretation  ECHO: 10/07/22: LVEF 55-60%, severely dilated RV, severely dilated RA, severe TR As per my personal interpretation  CATH: 01/09/23:  HEMODYNAMICS: RA:                  6 mmHg with large V waves RV:                  40/7 mmHg PA:                  41/18 mmHg (22 mean) PCWP:            10 mmHg (mean)                                      Estimated Fick CO/CI   8.3 L/min, 3.4 L/min/m2 Thermodilution CO/CI   6.6 L/min, 2.7 L/min/m2                                              TPG                 12  mmHg                                            PVR                 1.4-1.8 Wood Units  PAPi                3.8       IMPRESSION: Normal pre and post capillary filling pressures Normal PVR Normal cardiac output & index Large V waves in  RA consistent with severe TR   ASSESSMENT & PLAN:  Severe tricuspid regurgitation / RV failure - TTE dating back to 05/07/2017 with mild TR; normal leaflet coaptation.  - Admitted in 04/2022 w/ hypoxic respiratory failure with V/Q scan + for PE; blood cx with strep pyogenous. TTE during this time with RV failure & now severe tricuspid regurgitation with no signs of endocarditis effect the TV by TEE. I suspect his TR is secondary to poor cooaptation  from acute dilation of the RV. Will repeat TTE before next follow up. His PVR was only 1.4-1.8 wood units.  ? Triclip NYHA  - spironolactone  25mg  daily  -  Ectopic atrial tachycardia  Coronary artery disease status post CABG -  CABG x 4 in 2005; - continue ASA 81mg  daily & crestor  20mg  daily.   Pulmonary embolism - Diagnosed by V/Q scan in 04/2022; lower extremity dopplers negative.  - Diagnosed with pulmonary embolism in March 2024. Dr Gardenia spoke to him about discontinuing apixaban ; he has previously wanted to hold off and continue for the time being.   Hypertension - Previously unable to tolerate GDMT; spironolactone  25mg  daily only.   Hyperlipidemia - crestor  20mg  daily   Chronic kidney disease  Obstructive sleep apnea -waiting on CPAP  type 2 diabetes - Followed by his PCP   Follow up in ***  [name] 7:13 PM

## 2023-12-09 NOTE — Telephone Encounter (Signed)
 Called to confirm/remind patient of their appointment at the [facility] on 12/09/2023.   Appointment:   [x] Confirmed  [] Left mess   [] No answer/No voice mail  [] VM Full/unable to leave message  [] Phone not in service  Patient reminded to bring all medications and/or complete list.  Confirmed patient has transportation. Gave directions, instructed to utilize valet parking.

## 2023-12-10 ENCOUNTER — Telehealth (HOSPITAL_COMMUNITY): Payer: Self-pay

## 2023-12-10 ENCOUNTER — Ambulatory Visit: Payer: 59

## 2023-12-10 ENCOUNTER — Ambulatory Visit (HOSPITAL_COMMUNITY)
Admission: RE | Admit: 2023-12-10 | Discharge: 2023-12-10 | Disposition: A | Discharge status: Home / Self Care | Source: Ambulatory Visit | Attending: Adult Health | Admitting: Adult Health

## 2023-12-10 ENCOUNTER — Encounter (HOSPITAL_COMMUNITY): Payer: Self-pay | Admitting: Cardiology

## 2023-12-10 VITALS — BP 138/68 | HR 83 | Ht 71.0 in | Wt 296.0 lb

## 2023-12-10 DIAGNOSIS — I071 Rheumatic tricuspid insufficiency: Secondary | ICD-10-CM | POA: Diagnosis not present

## 2023-12-10 DIAGNOSIS — Z7982 Long term (current) use of aspirin: Secondary | ICD-10-CM | POA: Insufficient documentation

## 2023-12-10 DIAGNOSIS — Z86711 Personal history of pulmonary embolism: Secondary | ICD-10-CM | POA: Diagnosis not present

## 2023-12-10 DIAGNOSIS — Z79899 Other long term (current) drug therapy: Secondary | ICD-10-CM | POA: Diagnosis not present

## 2023-12-10 DIAGNOSIS — E1122 Type 2 diabetes mellitus with diabetic chronic kidney disease: Secondary | ICD-10-CM | POA: Diagnosis not present

## 2023-12-10 DIAGNOSIS — N1831 Chronic kidney disease, stage 3a: Secondary | ICD-10-CM | POA: Insufficient documentation

## 2023-12-10 DIAGNOSIS — I129 Hypertensive chronic kidney disease with stage 1 through stage 4 chronic kidney disease, or unspecified chronic kidney disease: Secondary | ICD-10-CM | POA: Diagnosis not present

## 2023-12-10 DIAGNOSIS — G4733 Obstructive sleep apnea (adult) (pediatric): Secondary | ICD-10-CM | POA: Insufficient documentation

## 2023-12-10 DIAGNOSIS — J45998 Other asthma: Secondary | ICD-10-CM | POA: Diagnosis not present

## 2023-12-10 DIAGNOSIS — I5032 Chronic diastolic (congestive) heart failure: Secondary | ICD-10-CM | POA: Diagnosis not present

## 2023-12-10 DIAGNOSIS — E785 Hyperlipidemia, unspecified: Secondary | ICD-10-CM | POA: Diagnosis not present

## 2023-12-10 DIAGNOSIS — I4719 Other supraventricular tachycardia: Secondary | ICD-10-CM | POA: Insufficient documentation

## 2023-12-10 DIAGNOSIS — I50812 Chronic right heart failure: Secondary | ICD-10-CM | POA: Diagnosis not present

## 2023-12-10 DIAGNOSIS — I2510 Atherosclerotic heart disease of native coronary artery without angina pectoris: Secondary | ICD-10-CM | POA: Diagnosis not present

## 2023-12-10 DIAGNOSIS — R0602 Shortness of breath: Secondary | ICD-10-CM | POA: Diagnosis not present

## 2023-12-10 DIAGNOSIS — Z951 Presence of aortocoronary bypass graft: Secondary | ICD-10-CM | POA: Insufficient documentation

## 2023-12-10 MED ORDER — TORSEMIDE 20 MG PO TABS: 40.0000 mg | ORAL_TABLET | Freq: Two times a day (BID) | ORAL | 3 refills | Status: DC

## 2023-12-10 NOTE — Patient Instructions (Addendum)
 Medication Changes:  INCREASE TORSEMIDE  TO 40MG  TWICE DAILY   Lab Work:  RETURN FOR LABS AS SCHEDULED NEXT WEEK   Referrals:  YOU HAVE BEEN REFERRED TO PARAMEDICINE THEY WILL REACH OUT TO YOU OR CALL TO ARRANGE THIS. PLEASE CALL US  WITH ANY CONCERNS   Follow-Up in: 6 WEEKS AS SCHEDULED  At the [facility], you and your health needs are our priority. We have a designated team specialized in the treatment of Heart Failure. This Care Team includes your primary Heart Failure Specialized Cardiologist (physician), Advanced Practice Providers (APPs- Physician Assistants and Nurse Practitioners), and Pharmacist who all work together to provide you with the care you need, when you need it.   You may see any of the following providers on your designated Care Team at your next follow up:  Dr. Toribio Fuel Dr. Ezra Shuck Dr. Odis Brownie Greig Mosses, NP Caffie Shed, [place] [facility] [place], [place] Beckey Coe, NP Jordan Lee, NP Tinnie Redman, PharmD   Please be sure to bring in all your medications bottles to every appointment.   Need to Contact Us :  If you have any questions or concerns before your next appointment please send us  a message through [place] or call our office at [phone].    TO LEAVE A MESSAGE FOR THE NURSE SELECT OPTION 2, PLEASE LEAVE A MESSAGE INCLUDING: YOUR NAME DATE OF BIRTH CALL BACK NUMBER REASON FOR CALL**this is important as we prioritize the call backs  YOU WILL RECEIVE A CALL BACK THE SAME DAY AS LONG AS YOU CALL BEFORE 4:00 PM

## 2023-12-10 NOTE — Telephone Encounter (Addendum)
 Received new referral for this patient. I called both numbers listed in his chart and the cell phone went straight to a VM and the home number rang once and went to message and I was able to LVM asking for him to return my call.   Update-he did call me back within a few min of that message I left and we have appoint set for Thursday nov 6th around clyda Izetta Quivers, EMT-Paramedic  [phone] 12/10/2023

## 2023-12-11 ENCOUNTER — Telehealth (HOSPITAL_COMMUNITY): Payer: Self-pay | Admitting: Licensed Clinical Social Worker

## 2023-12-11 NOTE — Telephone Encounter (Signed)
 CSW informed pt has been referred to [facility]- CSW called to complete psychosocial assessment- left VM requesting return call  Pattye Meda H. Celeste Tavenner, LCSW Clinical Social Worker [facility] Desk#: [phone] Cell#: [phone]

## 2023-12-15 ENCOUNTER — Ambulatory Visit: Payer: Self-pay

## 2023-12-15 NOTE — Telephone Encounter (Signed)
 FYI Only or Action Required?: FYI only for provider: new patient appointment scheduled with sports medicine.  Patient was last seen in primary care on 11/06/2023 by Kennyth Worth HERO, MD.  Called Nurse Triage reporting Neck Pain.  Symptoms began several weeks ago.  Interventions attempted: OTC medications: Tylenol , icy hot.  Symptoms are: neck pain radiates up to headgradually worsening.  Triage Disposition: Call Specialist Now (overriding See Physician Within 24 Hours)  Patient/caregiver understands and will follow disposition?: Yes                Copied from CRM #[id number]. Topic: Clinical - Red Word Triage >> Dec 15, 2023  9:19 AM Adelita E wrote: [facility] that prompted transfer to Nurse Triage: Neck pain. Patient stated when he turns his head to the side, there is a shooting pain from his neck to the top of head. Patient has been putting icy-hot on it without improvement. Looking to get into sports medicine. Reason for Disposition  Numbness in an arm or hand (i.e., loss of sensation)  Answer Assessment - Initial Assessment Questions Patient calling in on [facility] triage line, trying to establish care as a new patient at [facility] Sports [facility]. He is requesting an appointment with Dr Leonce. RN offered appointment phone # for their office and patient requested if RN would make appointment online for patient. Appointment made online with Sports Medicine next week, advised patient to call to check for sooner appointments.   1. ONSET: When did the pain begin?      3 weeks.  2. LOCATION: Where does it hurt?      Moves from the left, middle and right. Mostly on left back side of neck.  3. PATTERN Does the pain come and go, or has it been constant since it started?      Intermittent.  4. SEVERITY: How bad is the pain?  (Scale 0-10; or none or slight stiffness, mild, moderate, severe)     7-9/10. He states if he sits down and relaxes, it is a  7/10. Treating with Tylenol .  5. RADIATION: Does the pain go anywhere else, shoot into your arms?     Radiates up to the top the head, sharp pain if he turns a certain way.  6. CORD SYMPTOMS: Any weakness or numbness of the arms or legs?     He states he has numbness in both hands, all 5 fiver fingers on left hand and on the right hand pinky and ring finger.  7. CAUSE: What do you think is causing the neck pain?     Pinched nerve. He states he had a fall back in April and was supposed to have an MRI but has not been able to get it done yet, he states due to his cardiologist.  8. NECK OVERUSE: Any recent activities that involved turning or twisting the neck?     No.  9. OTHER SYMPTOMS: Do you have any other symptoms? (e.g., headache, fever, chest pain, difficulty breathing, neck swelling)     Denies chest pain, difficulty breathing, fever, nausea, vomiting. He states he has some intermittent swelling on the right shoulder and side of his neck.  Protocols used: Neck Pain or Stiffness-A-AH

## 2023-12-16 ENCOUNTER — Telehealth: Payer: Self-pay | Admitting: *Deleted

## 2023-12-16 NOTE — Telephone Encounter (Signed)
 That is ok

## 2023-12-16 NOTE — Telephone Encounter (Signed)
 Spoke with Lauraine at [phone]  [facility] to continue with nurse visit per Dr Kennyth

## 2023-12-16 NOTE — Telephone Encounter (Signed)
 Copied from CRM #[id number]. Topic: General - Other >> Dec 15, 2023  3:41 PM Franky GRADE wrote: Reason for CRM: Sarah with [facility] is calling because they received a referral for nursing and home health aide and patient is scheduled to be seen tomorrow; however, Lauraine wanted to inform that they don't have a home health aide in the area for the patient but can see her for nursing and wanted to verify if that is okay. [phone] voice mail is secure.   See note  Elora KRAFT

## 2023-12-17 ENCOUNTER — Telehealth: Payer: Self-pay | Admitting: Pharmacist

## 2023-12-17 ENCOUNTER — Other Ambulatory Visit: Payer: Self-pay | Admitting: Pharmacist

## 2023-12-17 ENCOUNTER — Telehealth: Payer: Self-pay | Admitting: *Deleted

## 2023-12-17 ENCOUNTER — Other Ambulatory Visit (HOSPITAL_COMMUNITY): Payer: Self-pay

## 2023-12-17 ENCOUNTER — Ambulatory Visit (HOSPITAL_COMMUNITY)

## 2023-12-17 NOTE — Telephone Encounter (Signed)
 Copied from CRM #[id number]. Topic: Clinical - Home Health Verbal Orders >> Dec 17, 2023  9:12 AM Treva T wrote: Caller/Agency: April, [facility]  Callback Number: [phone]  Service Requested: Skilled Nursing  Frequency: once a week for 6 weeks  Any new concerns about the patient? Yes, swelling in legs, and cardiac management education  Wants to also notify of medication interaction of Potassium, and spironolactone .   Please advise  Kaelynn Igo,RMA

## 2023-12-17 NOTE — Progress Notes (Signed)
 Paramedicine Encounter    Patient ID: [name] [name], male    DOB: 12/18/1955, 68 y.o.   MRN: [id number]   Complaints-denies  Edema-yes  Compliance with meds-yes  Pill box filled-some bottles/some in bubble packs If so, by whom-pharmacy   Refills needed-none    SOCIAL/MEDICAL BARRIERS:  PHARMACY USED -[facility]    MED ISSUES:  AFFORDABILITY-no   PT ASSIST APPS NEEDED-no  PCP-yes   INSURANCE-yes   SOURCE OF INCOME-ssi/ssd   TRANSPORTATION-car broke down-[employer] txp   FOOD INSECURITIES/NEEDS-no  FOOD STAMPS-yes   REVIEWED DIET/FLUID/SALT RESTRICTIONS-yes   RENT/OWN HOME ISSUES-no   SOCIAL SUPPORT-lives with friend-brother and sister live in [place]  SAFETY/DOMESTIC ISSUES-no   SUBSTANCE ABUSE-denies   DAILY WEIGHTS-yes   EDUCATE ON DISEASE PROCESS/SYMPTOMS/PURPOSES OF MEDS-yes  Also diabetic. He checks his cbg's.   First visit with the pt.  He has HHN for PT.   Pt reports he is feeling ok. He denies increased sob.  His car is broke down but he uses insurance txp unless its a short notice.  He uses iphone to keep up with his HR.  He denies c/p, no dizziness.  Pt denies feeling bloated.  He does have a lot swelling to his legs,  he is wearing his compression stockings. He says his legs look better than last week.   Pt understands limiting his fluids and weighing daily. He did not understand about why to limit the sodium-he thought it was based on his lab results and not related to it making his retain fluid, so that was explained to him.  He seems to have a good grip on most of the things. Will work on getting his pills moved back to [facility]-he has some there that he gets in a bubble pack and some bottles from [facility]. He is [facility].  He missed his labs today, so sent kamilah message about that and he will need a ride due to txp issues, can receive and send texts right now, but no  calls. Sent jenna message ref that as well, it is late in the day so hope to hear back soon to let him know and get that worked out.  Good urination.  No bleeding issues.   Will f/u with pt next week.    CBG's-150-200+ BP 110/60   Pulse 74   Resp 17   Wt 290 lb (131.5 kg)   SpO2 94%   BMI 40.45 kg/m  Weight yesterday-290 Last visit weight--296  Patient Care Team: Kennyth Worth HERO, MD as PCP - General (Family Medicine) Jeffrie Oneil BROCKS, MD as PCP - Cardiology (Cardiology) Faythe Purchase, MD as Consulting Physician (Endocrinology) Loreda Hacker, DPM as Consulting Physician (Podiatry) Neysa Reggy BIRCH, MD as Consulting Physician (Pulmonary Disease) [name], Elvie LITTIE, NP (Inactive) as Nurse Practitioner (Vascular Surgery) [facility] as Consulting Physician Carla Milling, RPH-CPP (Pharmacist)  Patient Active Problem List   Diagnosis Date Noted   GERD (gastroesophageal reflux disease) 11/06/2023   Stasis dermatitis 06/13/2022   Constipation 05/30/2022   Debility 05/30/2022   Severe tricuspid regurgitation 04/19/2022   Pulmonary embolism (HCC) 04/19/2022   Chronic right-sided heart failure (HCC) 04/19/2022   Decreased hearing 04/15/2022   Ectopic atrial tachycardia 02/12/2021   Simple chronic bronchitis (HCC) 12/17/2020   PND (post-nasal drip) 12/17/2020   Cough 10/31/2019   Intertrigo 10/31/2019   Coronary artery disease of bypass graft of native heart with stable angina  pectoris    Type 2 diabetes mellitus with other specified complication [facility]), followed by Dr. Faythe 10/14/2018   Chronic heart failure with preserved ejection fraction (HCC) 08/29/2017   Varicose vein of leg 04/13/2017   Polyneuropathy associated with underlying disease    Osteoarthritis 01/21/2017   Seasonal and perennial allergic rhinitis 07/17/2016   Ulnar neuritis, left 03/27/2016   PVD (peripheral vascular disease) (HCC) 04/30/2009   Morbid obesity (HCC)    HLD (hyperlipidemia)  01/04/2007   Obstructive sleep apnea 01/04/2007   HTN (hypertension) 01/04/2007    Current Outpatient Medications:    Accu-Chek Softclix Lancets lancets, TEST MORNING, NOON AND BEDTIME, Disp: 100 each, Rfl: 3   albuterol  (ACCUNEB ) 0.63 MG/3ML nebulizer solution, Take 3 mLs (0.63 mg total) by nebulization every 6 (six) hours as needed for wheezing., Disp: 75 mL, Rfl: 3   albuterol  (VENTOLIN  HFA) 108 (90 Base) MCG/ACT inhaler, INHALE TWO PUFFS BY MOUTH EVERY SIX HOURS AS NEEDED FOR WHEEZING OR SHORTNESS OF BREATH, Disp: 8.5 g, Rfl: 5   B-D ULTRAFINE III SHORT PEN 31G X 8 MM MISC, USE AS DIRECTED, Disp: 100 each, Rfl: 5   Blood Glucose Monitoring Suppl DEVI, 1 each by Does not apply route in the morning, at noon, and at bedtime. May substitute to any manufacturer covered by patient's insurance.Dx E11.69, Disp: 1 each, Rfl: 0   diclofenac  Sodium (VOLTAREN ) 1 % GEL, APPLY 2 GRAMS TOPICALLY 4 TIMES DAILY TO KNEE, Disp: 100 g, Rfl: 2   ELIQUIS  5 MG TABS tablet, TAKE ONE TABLET BY MOUTH TWICE DAILY (AM+PM), Disp: 60 tablet, Rfl: 1   famotidine  (PEPCID ) 20 MG tablet, Take 1 tablet (20 mg total) by mouth daily., Disp: 90 tablet, Rfl: 2   fluticasone  (FLONASE ) 50 MCG/ACT nasal spray, Place 1 spray into both nostrils daily., Disp: 16 g, Rfl: 12   Fluticasone -Umeclidin-Vilant (TRELEGY ELLIPTA ) 100-62.5-25 MCG/ACT AEPB, INHALE 1 PUFF INTO THE LUNGS DAILY, Disp: 180 each, Rfl: 4   formoterol  (PERFOROMIST ) 20 MCG/2ML nebulizer solution, Take 20 mcg by nebulization every 12 (twelve) hours., Disp: , Rfl:    glucose blood (ONETOUCH ULTRA) test strip, TESTING 8 TIMES DAILY, Disp: 800 strip, Rfl: 1   hydroxypropyl methylcellulose / hypromellose (ISOPTO TEARS / GONIOVISC) 2.5 % ophthalmic solution, Place 2 drops into both eyes as needed for dry eyes., Disp: 15 mL, Rfl: 12   insulin  glargine (LANTUS ) 100 UNIT/ML injection, Inject 24 Units into the skin daily., Disp: , Rfl:    insulin  lispro (HUMALOG ) 100 UNIT/ML  injection, Inject 22 Units into the skin 3 (three) times daily before meals., Disp: , Rfl:    lidocaine  (LIDODERM ) 5 %, Place 1 patch onto the skin daily. Remove & Discard patch within 12 hours or as directed by MD, Disp: 5 patch, Rfl: 0   linaclotide  (LINZESS ) 290 MCG CAPS capsule, Take 290 mcg by mouth as needed., Disp: , Rfl:    losartan  (COZAAR ) 100 MG tablet, TAKE 1 TABLET (100 MG TOTAL) BY MOUTH DAILY(AM), Disp: 30 tablet, Rfl: 1   magnesium  oxide (MAG-OX) 400 MG tablet, Take 1 tablet (400 mg total) by mouth daily., Disp: 90 tablet, Rfl: 3   metoprolol  tartrate (LOPRESSOR ) 25 MG tablet, TAKE 1/2 TABLET BY MOUTH TWICE A DAY (1/2AM+1/2PM), Disp: 48 tablet, Rfl: 1   mupirocin  ointment (BACTROBAN ) 2 %, Apply 1 Application topically 2 (two) times daily. To affected area till better, Disp: 22 g, Rfl: 0   nitroGLYCERIN  (NITROSTAT ) 0.4 MG SL tablet, Place 1 tablet (0.4 mg  total) under the tongue every 5 (five) minutes as needed for chest pain., Disp: 25 tablet, Rfl: 3   potassium chloride  SA (KLOR-CON  M) 20 MEQ tablet, TAKE 2 TABLETS(40 MEQ) BY MOUTH TWICE DAILY, Disp: 360 tablet, Rfl: 3   rosuvastatin  (CRESTOR ) 20 MG tablet, TAKE 1 TABLET (20 MG TOTAL) BY MOUTH EVERY EVENING., Disp: 30 tablet, Rfl: 1   silver  sulfADIAZINE  (SSD) 1 % cream, APPLY PEA SIZED AMOUNT TOPICALLY TO WOUND DAILY, Disp: 50 g, Rfl: 0   spironolactone  (ALDACTONE ) 25 MG tablet, Take 1 tablet (25 mg total) by mouth daily., Disp: 90 tablet, Rfl: 3   torsemide  (DEMADEX ) 20 MG tablet, Take 2 tablets (40 mg total) by mouth 2 (two) times daily., Disp: 120 tablet, Rfl: 3   pregabalin  (LYRICA ) 25 MG capsule, Take 1 capsule (25 mg total) by mouth 2 (two) times daily. (Patient not taking: Reported on 12/17/2023), Disp: 60 capsule, Rfl: 5 Allergies  Allergen Reactions   Empagliflozin  Rash    Patient reports yeast.    Ibuprofen Other (See Comments)    Stomach pain      Social History   Socioeconomic History   Marital status:  Significant Other    Spouse name: Not on file   Number of children: Not on file   Years of education: Not on file   Highest education level: Not on file  Occupational History   Occupation: Disabled     Comment: previously worked in delivery   Tobacco Use   Smoking status: Former    Current packs/day: 0.00    Average packs/day: 1.5 packs/day for 30.0 years (45.0 ttl pk-yrs)    Types: Cigarettes    Start date: 10/21/1974    Quit date: 10/20/2004    Years since quitting: 19.1    Passive exposure: Never   Smokeless tobacco: Never  Vaping Use   Vaping status: Never Used  Substance and Sexual Activity   Alcohol use: Yes    Comment: occasional   Drug use: No   Sexual activity: Not Currently  Other Topics Concern   Not on file  Social History Narrative   Lives with significant other x 30+ years, apartment friends smoke. Disability. Is disabled secondary to his work-related injury.       Has over 20 grandchildren    Social Drivers of Corporate Investment Banker Strain: Low Risk  (12/04/2022)   Overall Financial Resource Strain (CARDIA)    Difficulty of Paying Living Expenses: Not hard at all  Food Insecurity: No Food Insecurity (07/17/2023)   Hunger Vital Sign    Worried About Running Out of Food in the Last Year: Never true    Ran Out of Food in the Last Year: Never true  Transportation Needs: No Transportation Needs (10/05/2023)   PRAPARE - Administrator, Civil Service (Medical): No    Lack of Transportation (Non-Medical): No  Physical Activity: Insufficiently Active (03/13/2023)   Exercise Vital Sign    Days of Exercise per Week: 5 days    Minutes of Exercise per Session: 10 min  Stress: No Stress Concern Present (12/04/2022)   [employer] of Occupational Health - Occupational Stress Questionnaire    Feeling of Stress : Not at all  Social Connections: Socially Isolated (02/18/2023)   Social Connection and Isolation Panel    Frequency of Communication with  Friends and Family: Three times a week    Frequency of Social Gatherings with Friends and Family: Three times a week  Attends Religious Services: Never    Active Member of Clubs or Organizations: No    Attends Banker Meetings: Never    Marital Status: Never married  Intimate Partner Violence: Not At Risk (07/17/2023)   Humiliation, Afraid, Rape, and Kick questionnaire    Fear of Current or Ex-Partner: No    Emotionally Abused: No    Physically Abused: No    Sexually Abused: No    Physical Exam      Future Appointments  Date Time Provider Department Center  12/22/2023 11:00 AM Carla Milling, RPH-CPP [facility] None  12/24/2023 11:15 AM Leonce Katz, DO [facility] None  01/13/2024 10:00 AM Christine Rush, DPM [facility]  01/28/2024 10:40 AM Bensimhon, Toribio SAUNDERS, MD [facility] None       Izetta Quivers, Paramedic [phone] [facility] Paramedic  12/17/23

## 2023-12-17 NOTE — Telephone Encounter (Signed)
 Ok with me. Please place any necessary orders.

## 2023-12-17 NOTE — Telephone Encounter (Signed)
 Attempt was made to contact patient by phone today for follow up by Clinical Pharmacist regarding uncontrolled type 2 DM.  Unable to reach patient. Tried both numbers in chart and both rang several times and then I received the follow up message this call cannot be completed at this time. Please try your call again later

## 2023-12-18 ENCOUNTER — Ambulatory Visit (HOSPITAL_COMMUNITY)
Admission: RE | Admit: 2023-12-18 | Discharge: 2023-12-18 | Disposition: A | Discharge status: Home / Self Care | Source: Ambulatory Visit | Attending: Cardiology | Admitting: Cardiology

## 2023-12-18 ENCOUNTER — Ambulatory Visit (HOSPITAL_COMMUNITY): Payer: Self-pay | Admitting: Adult Health

## 2023-12-18 ENCOUNTER — Telehealth (HOSPITAL_COMMUNITY): Payer: Self-pay | Admitting: Licensed Clinical Social Worker

## 2023-12-18 DIAGNOSIS — I5032 Chronic diastolic (congestive) heart failure: Secondary | ICD-10-CM | POA: Insufficient documentation

## 2023-12-18 LAB — BASIC METABOLIC PANEL WITH GFR
Anion gap: 12 (ref 5–15)
BUN: 20 mg/dL (ref 8–23)
CO2: 25 mmol/L (ref 22–32)
Calcium: 8.9 mg/dL (ref 8.9–10.3)
Chloride: 98 mmol/L (ref 98–111)
Creatinine, Ser: 1.41 mg/dL — ABNORMAL HIGH (ref 0.61–1.24)
GFR, Estimated: 54 mL/min — ABNORMAL LOW (ref >60)
Glucose, Bld: 197 mg/dL — ABNORMAL HIGH (ref 70–99)
Potassium: 4.2 mmol/L (ref 3.5–5.1)
Sodium: 135 mmol/L (ref 135–145)

## 2023-12-18 NOTE — Telephone Encounter (Signed)
 H&V Care Navigation CSW Progress Note  Clinical Social Worker consulted to assist with transportation to todays lab appt- able to arrange [employer] taxi to pick up at 11:30am.  12/18/2023  [name] [name] DOB: 1956-01-15 MRN: [id number]   RIDER WAIVER AND RELEASE OF LIABILITY  For the purposes of helping with transportation needs, [facility] partners with outside transportation providers (taxi companies, [place], catering manager.) to give [facility] patients or other approved people the choice of on-demand rides Public Librarian) to our buildings for non-emergency visits.  By using [employer], I, the person signing this document, on behalf of myself and/or any legal minors (in my care using the [employer]), agree:  Science Writer given to me are supplied by independent, outside transportation providers who do not work for, or have any affiliation with, [employer]. [place] is not a transportation company. [facility] has no control over the quality or safety of the rides I get using [employer]. [facility] has no control over whether any outside ride will happen on time or not. [facility] gives no guarantee on the reliability, quality, safety, or availability on any rides, or that no mistakes will happen. I know and accept that traveling by vehicle (car, truck, SVU, fleeta, bus, taxi, etc.) has risks of serious injuries such as disability, being paralyzed, and death. I know and agree the risk of using [employer] is mine alone, and not [employer]. [employer] are provided as is and as are available. The transportation providers are in charge for all inspections and care of the vehicles used to provide these rides. I agree not to take legal action against [place], its agents, employees, officers, directors, representatives, insurers, attorneys, assigns, successors, subsidiaries, and affiliates at any time for any reasons related directly or indirectly to  using [employer]. I also agree not to take legal action against [place] [facility] or its affiliates for any injury, death, or damage to property caused by or related to using [employer]. I have read this Waiver and Release of Liability, and I understand the terms used in it and their legal meaning. This Waiver is freely and voluntarily given with the understanding that my right (or any legal minors) to legal action against  relating to [employer] is knowingly given up to use these services.   I attest that I read the Ride Waiver and Release of Liability to [name] [name], gave Mr. Franko the opportunity to ask questions and answered the questions asked (if any). I affirm that [name] [name] then provided consent for assistance with transportation.     Mechelle Pates H Chancellor [place]

## 2023-12-22 ENCOUNTER — Other Ambulatory Visit: Payer: Self-pay | Admitting: Pharmacist

## 2023-12-22 NOTE — Progress Notes (Signed)
 12/22/2023 Name: [name] [name] MRN: [id number] DOB: 07/05/1955  Chief Complaint  Patient presents with   Diabetes   Medication Management    [name] is a 68 y.o. year old male who presented for a telephone visit.   They were referred to the pharmacist by a quality report for assistance in managing diabetes and complex medication management.    Subjective:  Care Team: Primary Care Provider: Kennyth Worth HERO, MD ; Next Scheduled Visit: Not currently scheduled - missed appt 12/07/2023 Cardiologist: Bensimhon; Next Scheduled Visit: 01/28/2024 Endocrinologist Dr Faythe; Next Scheduled Visit: unable to see future visits Sports Medicine - Dr Leonce  - next appointment 12/24/2023 Podiatry - Dr Augusta - Next appointment 01/13/2024  Medication Access/Adherence  Current Pharmacy:  [facility] [phone] - [place], [place] - [address] AT [facility] [address] [facility] [address] [place] [address] Phone: [phone] Fax: [phone]  [facility] - [place]  [place], [place] - [address] [place] [address] [place] [address] Phone: [phone] Fax: [phone]  [facility] - [place], [place] - [address] [place] [address] Phone: [phone] Fax: [phone]   Patient reports affordability concerns with their medications: No  Patient reports access/transportation concerns to their pharmacy: Yes  - mostly uses [facility] because they deliver.  He is worried that he will reach the max number of rides that he is able to use for medical visits before the end of 2025.  Patient reports adherence concerns with their medications:  Yes  - has not been able to use DexCom sensors because he lost his Reader in an [place].    Diabetes: Managed by Dr Faythe at [facility] - last visit was 10/19/2023  Current medications: Lantus  22 units once a day in the morning Humalog  - 21 units 3 times a day +  correction based on sliding scale prior to meals.   No GLP1 due to history of pancreatitis.  Current glucose readings:  Checking blood glucose with finger stick meter for now.  Patient reports highest reading 243. Usually max is around 170's but had a few highs in the 200's recently.  He reports he feels low at 90.   Macrovascular and Microvascular Risk Reduction:  Statin? yes (rosuvastatin  last refilled for 30 days 12/08/2023 ); ACEi/ARB? yes (losartan  - last filled for 30 days 12/08/2023) Last urinary albumin /creatinine ratio:  Needed.  Last eye exam:  Lab Results  Component Value Date   HMDIABEYEEXA No Retinopathy 11/03/2022   Last foot exam: No foot exam found Tobacco Use:  Tobacco Use: Medium Risk (12/10/2023)   Patient History    Smoking Tobacco Use: Former    Smokeless Tobacco Use: Never    Passive Exposure: Never     Objective:  BP Readings from Last 3 Encounters:  12/17/23 110/60  12/10/23 138/68  11/06/23 102/63     Lab Results  Component Value Date   HGBA1C 10.9 10/19/2023    Lab Results  Component Value Date   CREATININE 1.41 (H) 12/18/2023   BUN 20 12/18/2023   NA 135 12/18/2023   K 4.2 12/18/2023   CL 98 12/18/2023   CO2 25 12/18/2023    Lab Results  Component Value Date   CHOL 130 04/08/2022   HDL 46 04/08/2022   LDLCALC 53 04/08/2022   LDLDIRECT 71.0 10/07/2018   TRIG 186 (H) 04/08/2022   CHOLHDL 2.8 04/08/2022  Medications Reviewed Today     Reviewed by Carla Milling, RPH-CPP (Pharmacist) on 12/22/23 at 1135  Med List Status: <None>   Medication Order Taking? Sig Documenting Provider Last Dose Status Informant  Accu-Chek Softclix Lancets lancets [id number] Yes TEST MORNING, NOON AND BEDTIME Kennyth Worth HERO, MD  Active Self    Discontinued 12/22/23 1127   albuterol  (VENTOLIN  HFA) 108 (90 Base) MCG/ACT inhaler [id number] Yes INHALE TWO PUFFS BY MOUTH EVERY SIX HOURS AS NEEDED FOR WHEEZING OR SHORTNESS OF BREATH Neysa Reggy BIRCH, MD   Active   B-D ULTRAFINE III SHORT PEN 31G X 8 MM MISC [id number]  USE AS DIRECTED Kennyth Worth HERO, MD  Active Self  Blood Glucose Monitoring Suppl DEVI [id number]  1 each by Does not apply route in the morning, at noon, and at bedtime. May substitute to any manufacturer covered by patient's insurance.Dx E11.69 Parker, Caleb M, MD  Active Self  diclofenac  Sodium (VOLTAREN ) 1 % GEL [id number] Yes APPLY 2 GRAMS TOPICALLY 4 TIMES DAILY TO KNEE Kennyth Worth HERO, MD  Active Self  ELIQUIS  5 MG TABS tablet [id number] Yes TAKE ONE TABLET BY MOUTH TWICE DAILY (AM+PM) Kennyth Worth HERO, MD  Active   famotidine  (PEPCID ) 20 MG tablet [id number] Yes Take 1 tablet (20 mg total) by mouth daily. Kennyth Worth HERO, MD  Active   fluticasone  (FLONASE ) 50 MCG/ACT nasal spray [id number]  Place 1 spray into both nostrils daily. Kennyth Worth HERO, MD  Active   Fluticasone -Umeclidin-Vilant (TRELEGY ELLIPTA ) 100-62.5-25 MCG/ACT AEPB [id number] Yes INHALE 1 PUFF INTO THE LUNGS DAILY Neysa Reggy D, MD  Active   formoterol  (PERFOROMIST ) 20 MCG/2ML nebulizer solution [id number] Yes Take 20 mcg by nebulization every 12 (twelve) hours. [provider]  Active Self  glucose blood (ONETOUCH ULTRA) test strip [id number]  TESTING 8 TIMES DAILY Kennyth Worth HERO, MD  Active   hydroxypropyl methylcellulose / hypromellose (ISOPTO TEARS / GONIOVISC) 2.5 % ophthalmic solution [id number] Yes Place 2 drops into both eyes as needed for dry eyes. Kennyth Worth HERO, MD  Active Self  insulin  glargine (LANTUS ) 100 UNIT/ML injection [id number] Yes Inject 24 Units into the skin daily. [provider]  Active   insulin  lispro (HUMALOG ) 100 UNIT/ML injection [id number] Yes Inject 22 Units into the skin 3 (three) times daily before meals.  Patient taking differently: Inject 22 Units into the skin 3 (three) times daily before meals. Takes per sliding scale provided by Dr Faythe.   [provider]  Active   lidocaine  (LIDODERM ) 5 % [id number] Yes Place  1 patch onto the skin daily. Remove & Discard patch within 12 hours or as directed by MD Kennyth Worth HERO, MD  Active   linaclotide  (LINZESS ) 290 MCG CAPS capsule [id number] Yes Take 290 mcg by mouth as needed. [provider]  Active   losartan  (COZAAR ) 100 MG tablet [id number] Yes TAKE 1 TABLET (100 MG TOTAL) BY MOUTH DAILY(AM) Sabharwal, Aditya, DO  Active   magnesium  oxide (MAG-OX) 400 MG tablet [id number] Yes Take 1 tablet (400 mg total) by mouth daily. Jeffrie Oneil BROCKS, MD  Active   metoprolol  tartrate (LOPRESSOR ) 25 MG tablet [id number] Yes TAKE 1/2 TABLET BY MOUTH TWICE A DAY (1/2AM+1/2PM)  Patient taking differently: Take 12.5 mg by mouth 2 (two) times daily.   Kennyth Worth HERO, MD  Active   mupirocin  ointment (BACTROBAN ) 2 % [id number]  Apply 1 Application topically 2 (two) times daily. To affected area till better Kennyth Worth HERO, MD  Active  nitroGLYCERIN  (NITROSTAT ) 0.4 MG SL tablet [id number]  Place 1 tablet (0.4 mg total) under the tongue every 5 (five) minutes as needed for chest pain. Sabharwal, Aditya, DO  Active   potassium chloride  SA (KLOR-CON  M) 20 MEQ tablet [id number] Yes TAKE 2 TABLETS(40 MEQ) BY MOUTH TWICE DAILY Thukkani, Arun K, MD  Active   pregabalin  (LYRICA ) 25 MG capsule [id number]  Take 1 capsule (25 mg total) by mouth 2 (two) times daily.  Patient not taking: Reported on 12/22/2023   Kennyth Worth HERO, MD  Active   rosuvastatin  (CRESTOR ) 20 MG tablet [id number] Yes TAKE 1 TABLET (20 MG TOTAL) BY MOUTH EVERY EVENING. Kennyth Worth HERO, MD  Active   silver  sulfADIAZINE  (SSD) 1 % cream [id number]  APPLY PEA SIZED AMOUNT TOPICALLY TO WOUND DAILY Kennyth Worth HERO, MD  Active   spironolactone  (ALDACTONE ) 25 MG tablet [id number] Yes Take 1 tablet (25 mg total) by mouth daily. Jeffrie Oneil BROCKS, MD  Active   torsemide  (DEMADEX ) 20 MG tablet [id number] Yes Take 2 tablets (40 mg total) by mouth 2 (two) times daily. Lenetta No D, NP  Active   traMADol  (ULTRAM ) 50 MG tablet [id number] Yes  Take 1 tablet by mouth 3 (three) times daily as needed for severe pain (pain score 7-10). [provider]  Active               Assessment/Plan:   Diabetes: - Currently uncontrolled; goal A1c <8%. Cardiorenal risk reduction is opportunities for improvement.. Blood pressure is at goal <130/80. LDL is at goal.  - Reviewed long term cardiovascular and renal outcomes of uncontrolled blood sugar. and Reviewed goal A1c, goal fasting, and goal 2 hour post prandial glucose. Recommended to check glucose prior to meals.  - Sent request to PCP for new DexCom reader. Patient will restart DexCom sensors after he gets new reader.  - Recommend to continue insulin  regimen per Dr Micheline instruction. - Due to have UACR and A1c rechecked but patient is worried that he will run out of transportation services due to having several visits before February 10, 2024.  - Discussed annual diabatic eye exam - patient has appointment scheduled for January 2026.   - Patient will continue to get medications packaged thru [facility]. He will continue to get the following outside of packaging - magnesium , potassium, torsemide  and famotidine .   Follow Up Plan: 2 weeks  Madelin Ray, PharmD Clinical Pharmacist [facility]  Population Health [phone]

## 2023-12-22 NOTE — Telephone Encounter (Signed)
 Called [name] [name]  at [phone] LVM with VO

## 2023-12-23 NOTE — Progress Notes (Signed)
 [name] [name] [name] [address] [place] [address] Phone: [phone]   Assessment and Plan:     1. Neck pain (Primary) 2. DDD (degenerative disc disease), cervical -Chronic with exacerbation, subsequent visit - Continued, chronic, worsening neck pain with left-sided radicular symptoms that worsened after fall in April 2025 - Patient had x-ray at previous office visit and was planned for cervical spine MRI, however patient never got C-spine MRI due to life circumstances and other medical concerns.  Recommend proceeding with C-spine MRI due to pain >6 weeks, traumatic MOI, pain >6/10, pain affecting day-to-day activities - Use Tylenol  500 to 1000 mg tablets 2-3 times a day for day-to-day pain relief - May use tramadol  50 mg daily as needed for severe breakthrough pain - Do not recommend prescription NSAIDs or prednisone  due to past medical history of DM type II and chronic anticoagulation on Eliquis  - Patient has had negative side effects including drowsiness and hallucinations with gabapentin  and muscle relaxer use in the past, so do not recommend repeating their use at this time - Continue gentle HEP for neck    Pertinent previous records reviewed include none   Follow Up: 1 week after MRI to review results and discuss treatment plan which could include epidural if appropriate   Subjective:   I, [name] [name], am serving as a neurosurgeon for Doctor [employer]   Chief Complaint: neck pain    HPI:    06/03/23 Patient is a 68 year old male with neck pain. Patient states he fell last week. Neck pain that radiates to his head and ears. Decreased ROM due to pain. Feels like a cramp. Icy hot and tylenol  dont help. Pain does go down to his arm , but hx of neuropathy. Pain does radiate down his back    12/24/2023 Patient states pain has gotten worse since the last time he was in    Relevant Historical Information: DM type II,  hypertension, elevated BMI, PVD, chronic anticoagulation on Eliquis     Additional pertinent review of systems negative.      Current Outpatient Medications:    Accu-Chek Softclix Lancets lancets, TEST MORNING, NOON AND BEDTIME, Disp: 100 each, Rfl: 3   albuterol  (VENTOLIN  HFA) 108 (90 Base) MCG/ACT inhaler, INHALE TWO PUFFS BY MOUTH EVERY SIX HOURS AS NEEDED FOR WHEEZING OR SHORTNESS OF BREATH, Disp: 8.5 g, Rfl: 5   B-D ULTRAFINE III SHORT PEN 31G X 8 MM MISC, USE AS DIRECTED, Disp: 100 each, Rfl: 5   Blood Glucose Monitoring Suppl DEVI, 1 each by Does not apply route in the morning, at noon, and at bedtime. May substitute to any manufacturer covered by patient's insurance.Dx E11.69, Disp: 1 each, Rfl: 0   diclofenac  Sodium (VOLTAREN ) 1 % GEL, APPLY 2 GRAMS TOPICALLY 4 TIMES DAILY TO KNEE, Disp: 100 g, Rfl: 2   ELIQUIS  5 MG TABS tablet, TAKE ONE TABLET BY MOUTH TWICE DAILY (AM+PM), Disp: 60 tablet, Rfl: 1   famotidine  (PEPCID ) 20 MG tablet, Take 1 tablet (20 mg total) by mouth daily., Disp: 90 tablet, Rfl: 2   fluticasone  (FLONASE ) 50 MCG/ACT nasal spray, Place 1 spray into both nostrils daily., Disp: 16 g, Rfl: 12   Fluticasone -Umeclidin-Vilant (TRELEGY ELLIPTA ) 100-62.5-25 MCG/ACT AEPB, INHALE 1 PUFF INTO THE LUNGS DAILY, Disp: 180 each, Rfl: 4   formoterol  (PERFOROMIST ) 20 MCG/2ML nebulizer solution, Take 20 mcg by nebulization every 12 (twelve) hours., Disp: , Rfl:    glucose blood (ONETOUCH ULTRA)  test strip, TESTING 8 TIMES DAILY, Disp: 800 strip, Rfl: 1   hydroxypropyl methylcellulose / hypromellose (ISOPTO TEARS / GONIOVISC) 2.5 % ophthalmic solution, Place 2 drops into both eyes as needed for dry eyes., Disp: 15 mL, Rfl: 12   insulin  glargine (LANTUS ) 100 UNIT/ML injection, Inject 24 Units into the skin daily., Disp: , Rfl:    insulin  lispro (HUMALOG ) 100 UNIT/ML injection, Inject 22 Units into the skin 3 (three) times daily before meals. (Patient taking differently: Inject 22 Units  into the skin 3 (three) times daily before meals. Takes per sliding scale provided by Dr Faythe.), Disp: , Rfl:    lidocaine  (LIDODERM ) 5 %, Place 1 patch onto the skin daily. Remove & Discard patch within 12 hours or as directed by MD, Disp: 5 patch, Rfl: 0   linaclotide  (LINZESS ) 290 MCG CAPS capsule, Take 290 mcg by mouth as needed., Disp: , Rfl:    losartan  (COZAAR ) 100 MG tablet, TAKE 1 TABLET (100 MG TOTAL) BY MOUTH DAILY(AM), Disp: 30 tablet, Rfl: 1   magnesium  oxide (MAG-OX) 400 MG tablet, Take 1 tablet (400 mg total) by mouth daily., Disp: 90 tablet, Rfl: 3   metoprolol  tartrate (LOPRESSOR ) 25 MG tablet, TAKE 1/2 TABLET BY MOUTH TWICE A DAY (1/2AM+1/2PM) (Patient taking differently: Take 12.5 mg by mouth 2 (two) times daily.), Disp: 48 tablet, Rfl: 1   mupirocin  ointment (BACTROBAN ) 2 %, Apply 1 Application topically 2 (two) times daily. To affected area till better, Disp: 22 g, Rfl: 0   nitroGLYCERIN  (NITROSTAT ) 0.4 MG SL tablet, Place 1 tablet (0.4 mg total) under the tongue every 5 (five) minutes as needed for chest pain., Disp: 25 tablet, Rfl: 3   potassium chloride  SA (KLOR-CON  M) 20 MEQ tablet, TAKE 2 TABLETS(40 MEQ) BY MOUTH TWICE DAILY, Disp: 360 tablet, Rfl: 3   pregabalin  (LYRICA ) 25 MG capsule, Take 1 capsule (25 mg total) by mouth 2 (two) times daily. (Patient not taking: Reported on 12/22/2023), Disp: 60 capsule, Rfl: 5   rosuvastatin  (CRESTOR ) 20 MG tablet, TAKE 1 TABLET (20 MG TOTAL) BY MOUTH EVERY EVENING., Disp: 30 tablet, Rfl: 1   silver  sulfADIAZINE  (SSD) 1 % cream, APPLY PEA SIZED AMOUNT TOPICALLY TO WOUND DAILY, Disp: 50 g, Rfl: 0   spironolactone  (ALDACTONE ) 25 MG tablet, Take 1 tablet (25 mg total) by mouth daily., Disp: 90 tablet, Rfl: 3   torsemide  (DEMADEX ) 20 MG tablet, Take 2 tablets (40 mg total) by mouth 2 (two) times daily., Disp: 120 tablet, Rfl: 3   traMADol  (ULTRAM ) 50 MG tablet, Take 1 tablet by mouth 3 (three) times daily as needed for severe pain (pain score  7-10)., Disp: , Rfl:    Objective:     Vitals:   12/24/23 1048  Pulse: 67  SpO2: 97%  Weight: 290 lb (131.5 kg)  Height: 5' 11 (1.803 m)      Body mass index is 40.45 kg/m.    Physical Exam:    Neck Exam: Cervical Spine- Posture normal Skin- normal, intact   Neuro:  Strength-   Right Left  Deltoid (C5) 5/5 5/5 Bicep/Brachioradialis (C5/6) 5/5  5/5 Wrist Extension (C6) 5/5 5/5 Tricep (C7) 5/5 5/5 Wrist Flexion (C7) 5/5 5/5 Grip (C8) 5/5 5/5 Finger Abduction (T1) 5/5 5/5   Sensation: Decreased sensation in the left fingers compared to right the patient described as baseline.  Otherwise, sensation intact to light touch in upper extremities   Spurling's:  negative bilaterally Neck ROM: Full active ROM TTP: Left  cervical paraspinal, left trapezius NTTP: cervical spinous processes, right cervical paraspinal, thoracic paraspinal, right trapezius       Electronically signed by:  Odis Mace [name] 11:13 AM 12/24/23

## 2023-12-24 ENCOUNTER — Ambulatory Visit: Admitting: Sports Medicine

## 2023-12-24 VITALS — HR 67 | Ht 71.0 in | Wt 290.0 lb

## 2023-12-24 DIAGNOSIS — M542 Cervicalgia: Secondary | ICD-10-CM

## 2023-12-24 DIAGNOSIS — M5030 Other cervical disc degeneration, unspecified cervical region: Secondary | ICD-10-CM

## 2023-12-24 NOTE — Patient Instructions (Addendum)
 MRI referral   Tylenol  [phone] mg 2-3 times a day for pain relief   Tramadol  as needed for severe breakthrough pain   Follow up 1 week after MRI

## 2023-12-28 MED ORDER — DEXCOM G7 SENSOR MISC: 1 refills | Status: AC

## 2023-12-28 MED ORDER — DEXCOM G7 RECEIVER DEVI: 0 refills | Status: AC

## 2023-12-29 ENCOUNTER — Other Ambulatory Visit (HOSPITAL_COMMUNITY): Payer: Self-pay

## 2023-12-29 NOTE — Progress Notes (Signed)
 Paramedicine Encounter    Patient ID: [name] [name], male    DOB: 1955-06-20, 68 y.o.   MRN: [id number]   Complaints-denies  Edema-yes  Compliance with meds-yes  Pill box filled-1/2 bubble packs and 1/2 bottles If so, [facility]  Refills needed-none at this time      Pt reports he is doing ok. He denies increased sob, no dizziness, no c/p. He has had 3lb weight gain overnight-he ate chicken livers, chips and high salt foods. He said he knew better than to eat that stuff and he had soup and sandwich last night for dinner.  He does have some swelling to his legs-he is wearing compression stockings.   He had his meds today.  He is confused on the effect of insulin /cbg and fluid. He keeps thinking his weight gain is b/c of his high blood sugars.  So we have to continue education on that. Tried to explain this to him but I dont think he has a grasp on this concept yet.  I asked him what he does when he notices gain on his scales. He is saying he takes his insulin .  He is planning on eating a salad for lunch. And I advised him to cut back on his fluids and watch his salt intake and to be cautious what goes on the salad. We talked about that as well.  He said he would.  His weight is down from last visit too.  He still has HHN come out, she is measuring his legs and swelling.   [facility] is willing to [facility] to [facility] so [facility] to [facility]-with the exception of his inhalers, insulin  and supplies.   CBG PTA-400 this am  Had to palpate his right arm for the b/p and it was 106/palpated- he used his auto cuff to get reading as listed below.  His left arm does not have pulses routinely due to the vein surgery on that arm.   Will f/u with pt next week.  I told him to reach out to me tomor and let me know what is weight is.   BP 120/67   Pulse 85   Resp 16   Wt 285 lb (129.3 kg)   SpO2 95%   BMI 39.75 kg/m  Weight  yesterday-282 Last visit weight-290  Patient Care Team: Kennyth Worth HERO, MD as PCP - General (Family Medicine) Jeffrie Oneil BROCKS, MD as PCP - Cardiology (Cardiology) Faythe Purchase, MD as Consulting Physician (Endocrinology) Loreda Hacker, DPM as Consulting Physician (Podiatry) Neysa Reggy BIRCH, MD as Consulting Physician (Pulmonary Disease) [name], Elvie LITTIE, NP (Inactive) as Nurse Practitioner (Vascular Surgery) [facility] as Consulting Physician Carla Milling, RPH-CPP (Pharmacist)  Patient Active Problem List   Diagnosis Date Noted   GERD (gastroesophageal reflux disease) 11/06/2023   Stasis dermatitis 06/13/2022   Constipation 05/30/2022   Debility 05/30/2022   Severe tricuspid regurgitation 04/19/2022   Pulmonary embolism (HCC) 04/19/2022   Chronic right-sided heart failure (HCC) 04/19/2022   Decreased hearing 04/15/2022   Ectopic atrial tachycardia 02/12/2021   Simple chronic bronchitis (HCC) 12/17/2020   PND (post-nasal drip) 12/17/2020   Cough 10/31/2019   Intertrigo 10/31/2019   Coronary artery disease of bypass graft of native heart with stable angina pectoris    Type 2 diabetes mellitus with other specified complication (HCC), followed by Dr. Faythe 10/14/2018   Chronic heart failure with preserved ejection fraction (HCC) 08/29/2017   Varicose vein  of leg 04/13/2017   Polyneuropathy associated with underlying disease    Osteoarthritis 01/21/2017   Seasonal and perennial allergic rhinitis 07/17/2016   Ulnar neuritis, left 03/27/2016   PVD (peripheral vascular disease) (HCC) 04/30/2009   Morbid obesity (HCC)    HLD (hyperlipidemia) 01/04/2007   Obstructive sleep apnea 01/04/2007   HTN (hypertension) 01/04/2007    Current Outpatient Medications:    Accu-Chek Softclix Lancets lancets, TEST MORNING, NOON AND BEDTIME, Disp: 100 each, Rfl: 3   albuterol  (VENTOLIN  HFA) 108 (90 Base) MCG/ACT inhaler, INHALE TWO PUFFS BY MOUTH EVERY SIX HOURS AS NEEDED FOR  WHEEZING OR SHORTNESS OF BREATH, Disp: 8.5 g, Rfl: 5   B-D ULTRAFINE III SHORT PEN 31G X 8 MM MISC, USE AS DIRECTED, Disp: 100 each, Rfl: 5   Blood Glucose Monitoring Suppl DEVI, 1 each by Does not apply route in the morning, at noon, and at bedtime. May substitute to any manufacturer covered by patient's insurance.Dx E11.69, Disp: 1 each, Rfl: 0   Continuous Glucose Receiver (DEXCOM G7 RECEIVER) DEVI, Use to check blood glucose several times per day, Disp: 1 each, Rfl: 0   Continuous Glucose Sensor (DEXCOM G7 SENSOR) MISC, Use to check blood glucose several times per day, Disp: 3 each, Rfl: 1   diclofenac  Sodium (VOLTAREN ) 1 % GEL, APPLY 2 GRAMS TOPICALLY 4 TIMES DAILY TO KNEE, Disp: 100 g, Rfl: 2   ELIQUIS  5 MG TABS tablet, TAKE ONE TABLET BY MOUTH TWICE DAILY (AM+PM), Disp: 60 tablet, Rfl: 1   famotidine  (PEPCID ) 20 MG tablet, Take 1 tablet (20 mg total) by mouth daily., Disp: 90 tablet, Rfl: 2   fluticasone  (FLONASE ) 50 MCG/ACT nasal spray, Place 1 spray into both nostrils daily., Disp: 16 g, Rfl: 12   Fluticasone -Umeclidin-Vilant (TRELEGY ELLIPTA ) 100-62.5-25 MCG/ACT AEPB, INHALE 1 PUFF INTO THE LUNGS DAILY, Disp: 180 each, Rfl: 4   formoterol  (PERFOROMIST ) 20 MCG/2ML nebulizer solution, Take 20 mcg by nebulization every 12 (twelve) hours., Disp: , Rfl:    glucose blood (ONETOUCH ULTRA) test strip, TESTING 8 TIMES DAILY, Disp: 800 strip, Rfl: 1   hydroxypropyl methylcellulose / hypromellose (ISOPTO TEARS / GONIOVISC) 2.5 % ophthalmic solution, Place 2 drops into both eyes as needed for dry eyes., Disp: 15 mL, Rfl: 12   insulin  glargine (LANTUS ) 100 UNIT/ML injection, Inject 24 Units into the skin daily., Disp: , Rfl:    insulin  lispro (HUMALOG ) 100 UNIT/ML injection, Inject 22 Units into the skin 3 (three) times daily before meals. (Patient taking differently: Inject 22 Units into the skin 3 (three) times daily before meals. Takes per sliding scale provided by Dr Faythe.), Disp: , Rfl:    lidocaine   (LIDODERM ) 5 %, Place 1 patch onto the skin daily. Remove & Discard patch within 12 hours or as directed by MD, Disp: 5 patch, Rfl: 0   linaclotide  (LINZESS ) 290 MCG CAPS capsule, Take 290 mcg by mouth as needed., Disp: , Rfl:    losartan  (COZAAR ) 100 MG tablet, TAKE 1 TABLET (100 MG TOTAL) BY MOUTH DAILY(AM), Disp: 30 tablet, Rfl: 1   magnesium  oxide (MAG-OX) 400 MG tablet, Take 1 tablet (400 mg total) by mouth daily., Disp: 90 tablet, Rfl: 3   metoprolol  tartrate (LOPRESSOR ) 25 MG tablet, TAKE 1/2 TABLET BY MOUTH TWICE A DAY (1/2AM+1/2PM) (Patient taking differently: Take 12.5 mg by mouth 2 (two) times daily.), Disp: 48 tablet, Rfl: 1   mupirocin  ointment (BACTROBAN ) 2 %, Apply 1 Application topically 2 (two) times daily. To affected area till  better, Disp: 22 g, Rfl: 0   nitroGLYCERIN  (NITROSTAT ) 0.4 MG SL tablet, Place 1 tablet (0.4 mg total) under the tongue every 5 (five) minutes as needed for chest pain., Disp: 25 tablet, Rfl: 3   potassium chloride  SA (KLOR-CON  M) 20 MEQ tablet, TAKE 2 TABLETS(40 MEQ) BY MOUTH TWICE DAILY, Disp: 360 tablet, Rfl: 3   pregabalin  (LYRICA ) 25 MG capsule, Take 1 capsule (25 mg total) by mouth 2 (two) times daily. (Patient not taking: Reported on 12/22/2023), Disp: 60 capsule, Rfl: 5   rosuvastatin  (CRESTOR ) 20 MG tablet, TAKE 1 TABLET (20 MG TOTAL) BY MOUTH EVERY EVENING., Disp: 30 tablet, Rfl: 1   silver  sulfADIAZINE  (SSD) 1 % cream, APPLY PEA SIZED AMOUNT TOPICALLY TO WOUND DAILY, Disp: 50 g, Rfl: 0   spironolactone  (ALDACTONE ) 25 MG tablet, Take 1 tablet (25 mg total) by mouth daily., Disp: 90 tablet, Rfl: 3   torsemide  (DEMADEX ) 20 MG tablet, Take 2 tablets (40 mg total) by mouth 2 (two) times daily., Disp: 120 tablet, Rfl: 3   traMADol  (ULTRAM ) 50 MG tablet, Take 1 tablet by mouth 3 (three) times daily as needed for severe pain (pain score 7-10)., Disp: , Rfl:  Allergies  Allergen Reactions   Empagliflozin  Rash    Patient reports yeast.    Ibuprofen Other  (See Comments)    Stomach pain      Social History   Socioeconomic History   Marital status: Significant Other    Spouse name: Not on file   Number of children: Not on file   Years of education: Not on file   Highest education level: Not on file  Occupational History   Occupation: Disabled     Comment: previously worked in delivery   Tobacco Use   Smoking status: Former    Current packs/day: 0.00    Average packs/day: 1.5 packs/day for 30.0 years (45.0 ttl pk-yrs)    Types: Cigarettes    Start date: 10/21/1974    Quit date: 10/20/2004    Years since quitting: 19.2    Passive exposure: Never   Smokeless tobacco: Never  Vaping Use   Vaping status: Never Used  Substance and Sexual Activity   Alcohol use: Yes    Comment: occasional   Drug use: No   Sexual activity: Not Currently  Other Topics Concern   Not on file  Social History Narrative   Lives with significant other x 30+ years, apartment friends smoke. Disability. Is disabled secondary to his work-related injury.       Has over 20 grandchildren    Social Drivers of Corporate Investment Banker Strain: Low Risk  (12/04/2022)   Overall Financial Resource Strain (CARDIA)    Difficulty of Paying Living Expenses: Not hard at all  Food Insecurity: No Food Insecurity (07/17/2023)   Hunger Vital Sign    Worried About Running Out of Food in the Last Year: Never true    Ran Out of Food in the Last Year: Never true  Transportation Needs: Unmet Transportation Needs (12/18/2023)   PRAPARE - Administrator, Civil Service (Medical): Yes    Lack of Transportation (Non-Medical): No  Physical Activity: Insufficiently Active (03/13/2023)   Exercise Vital Sign    Days of Exercise per Week: 5 days    Minutes of Exercise per Session: 10 min  Stress: No Stress Concern Present (12/04/2022)   [employer] of Occupational Health - Occupational Stress Questionnaire    Feeling of Stress : Not at  all  Social Connections:  Socially Isolated (02/18/2023)   Social Connection and Isolation Panel    Frequency of Communication with Friends and Family: Three times a week    Frequency of Social Gatherings with Friends and Family: Three times a week    Attends Religious Services: Never    Active Member of Clubs or Organizations: No    Attends Banker Meetings: Never    Marital Status: Never married  Intimate Partner Violence: Not At Risk (07/17/2023)   Humiliation, Afraid, Rape, and Kick questionnaire    Fear of Current or Ex-Partner: No    Emotionally Abused: No    Physically Abused: No    Sexually Abused: No    Physical Exam      Future Appointments  Date Time Provider Department Center  01/05/2024 11:00 AM Carla Milling, RPH-CPP [facility] None  01/13/2024 10:00 AM Christine Rush, DPM [facility]  01/28/2024 10:40 AM Bensimhon, Toribio SAUNDERS, MD [facility] None  02/02/2024 11:30 AM GI-315 MR 1 GI-315MRI [facility] Izetta Quivers, Paramedic [phone] [facility] Paramedic  12/29/23

## 2024-01-01 ENCOUNTER — Telehealth: Payer: Self-pay | Admitting: Family Medicine

## 2024-01-01 NOTE — Telephone Encounter (Signed)
 [facility] faxed  Home Health Certificate (Order [place] [id number]), to be filled out by provider. Patient requested to send it back via Fax within ASAP. Document is located in providers tray at front office.Please advise at [phone].

## 2024-01-04 NOTE — Telephone Encounter (Signed)
Form placed in PCP office to be reviewed  

## 2024-01-05 ENCOUNTER — Telehealth: Payer: Self-pay | Admitting: Pharmacist

## 2024-01-05 ENCOUNTER — Other Ambulatory Visit (HOSPITAL_COMMUNITY): Payer: Self-pay

## 2024-01-05 ENCOUNTER — Other Ambulatory Visit: Payer: Self-pay | Admitting: Cardiology

## 2024-01-05 ENCOUNTER — Other Ambulatory Visit: Admitting: Pharmacist

## 2024-01-05 NOTE — Progress Notes (Signed)
 Paramedicine Encounter    Patient ID: [name] [name], male    DOB: 11/25/1955, 68 y.o.   MRN: [id number]   Complaints-denies   Edema-yes  Compliance with meds-yes  Pill box filled-no If so, by whom-n/a  Refills needed-old bubble packs from [facility] -needs refills for all     Pt reports he is doing ok. He denies increased sob, no dizziness, no c/p.  He said he called in refills for his meds from [facility], so I am going to reach out to see what is being filled b/c some are in bubble packs and some are in bottles.  Last visit he was moving all pills to [facility]. I sent pharmacy a list of his pills that need to be placed in pill box.  He had fried chicken nuggets last night for dinner. So we talked about that again and how the high sodium in fried foods will make him fluid retention worse. He said he understands that but seems to want too enjoy what he is eating.   He does have swelling to his legs, but seems the same as last time. He does have a HHN that comes out to measure his legs.  He does not have his compression socks right now. She will be out tomor.  It sounded like he wears his compression socks for about 2 days and then takes a day off and it seemed like he worn them to bed as well.  So I advised him they were not needed at bedtime, wear them when he wakes up until he goes to bed.   He is going to f/u with pulm to get his sleep study done.   Due to some meds still having several wk supply left,  the torsemide , potassium and pepcid  will not be able to be placed in bubble packs right now.   Had to palpate his b/p-my cuff is too small for his upper arm- he used his auto cuff here and got  107/67  BP (!) 98/0   Pulse 75   Resp 16   Wt 286 lb (129.7 kg)   SpO2 94%   BMI 39.89 kg/m  Weight yesterday-285 Last visit weight-285  Patient Care Team: Kennyth Worth HERO, MD as PCP - General (Family Medicine) Jeffrie Oneil BROCKS, MD as PCP - Cardiology  (Cardiology) Faythe Purchase, MD as Consulting Physician (Endocrinology) Loreda Hacker, DPM as Consulting Physician (Podiatry) Neysa Reggy BIRCH, MD as Consulting Physician (Pulmonary Disease) [name], Elvie LITTIE, NP (Inactive) as Nurse Practitioner (Vascular Surgery) [facility] as Consulting Physician Carla Milling, RPH-CPP (Pharmacist)  Patient Active Problem List   Diagnosis Date Noted   GERD (gastroesophageal reflux disease) 11/06/2023   Stasis dermatitis 06/13/2022   Constipation 05/30/2022   Debility 05/30/2022   Severe tricuspid regurgitation 04/19/2022   Pulmonary embolism (HCC) 04/19/2022   Chronic right-sided heart failure (HCC) 04/19/2022   Decreased hearing 04/15/2022   Ectopic atrial tachycardia 02/12/2021   Simple chronic bronchitis (HCC) 12/17/2020   PND (post-nasal drip) 12/17/2020   Cough 10/31/2019   Intertrigo 10/31/2019   Coronary artery disease of bypass graft of native heart with stable angina pectoris    Type 2 diabetes mellitus with other specified complication (HCC), followed by Dr. Faythe 10/14/2018   Chronic heart failure with preserved ejection fraction (HCC) 08/29/2017   Varicose vein of leg 04/13/2017   Polyneuropathy associated with underlying disease    Osteoarthritis 01/21/2017   Seasonal and perennial allergic rhinitis 07/17/2016  Ulnar neuritis, left 03/27/2016   PVD (peripheral vascular disease) (HCC) 04/30/2009   Morbid obesity (HCC)    HLD (hyperlipidemia) 01/04/2007   Obstructive sleep apnea 01/04/2007   HTN (hypertension) 01/04/2007    Current Outpatient Medications:    ELIQUIS  5 MG TABS tablet, TAKE ONE TABLET BY MOUTH TWICE DAILY (AM+PM), Disp: 60 tablet, Rfl: 1   famotidine  (PEPCID ) 20 MG tablet, Take 1 tablet (20 mg total) by mouth daily., Disp: 90 tablet, Rfl: 2   losartan  (COZAAR ) 100 MG tablet, TAKE 1 TABLET (100 MG TOTAL) BY MOUTH DAILY(AM), Disp: 30 tablet, Rfl: 1   magnesium  oxide (MAG-OX) 400 MG tablet, Take 1  tablet (400 mg total) by mouth daily., Disp: 90 tablet, Rfl: 3   potassium chloride  SA (KLOR-CON  M) 20 MEQ tablet, TAKE 2 TABLETS(40 MEQ) BY MOUTH TWICE DAILY, Disp: 360 tablet, Rfl: 3   rosuvastatin  (CRESTOR ) 20 MG tablet, TAKE 1 TABLET (20 MG TOTAL) BY MOUTH EVERY EVENING., Disp: 30 tablet, Rfl: 1   spironolactone  (ALDACTONE ) 25 MG tablet, Take 1 tablet (25 mg total) by mouth daily., Disp: 90 tablet, Rfl: 3   torsemide  (DEMADEX ) 20 MG tablet, Take 2 tablets (40 mg total) by mouth 2 (two) times daily., Disp: 120 tablet, Rfl: 3   traMADol  (ULTRAM ) 50 MG tablet, Take 1 tablet by mouth 3 (three) times daily as needed for severe pain (pain score 7-10)., Disp: , Rfl:    Accu-Chek Softclix Lancets lancets, TEST MORNING, NOON AND BEDTIME, Disp: 100 each, Rfl: 3   albuterol  (VENTOLIN  HFA) 108 (90 Base) MCG/ACT inhaler, INHALE TWO PUFFS BY MOUTH EVERY SIX HOURS AS NEEDED FOR WHEEZING OR SHORTNESS OF BREATH, Disp: 8.5 g, Rfl: 5   B-D ULTRAFINE III SHORT PEN 31G X 8 MM MISC, USE AS DIRECTED, Disp: 100 each, Rfl: 5   Blood Glucose Monitoring Suppl DEVI, 1 each by Does not apply route in the morning, at noon, and at bedtime. May substitute to any manufacturer covered by patient's insurance.Dx E11.69, Disp: 1 each, Rfl: 0   Continuous Glucose Receiver (DEXCOM G7 RECEIVER) DEVI, Use to check blood glucose several times per day, Disp: 1 each, Rfl: 0   Continuous Glucose Sensor (DEXCOM G7 SENSOR) MISC, Use to check blood glucose several times per day, Disp: 3 each, Rfl: 1   diclofenac  Sodium (VOLTAREN ) 1 % GEL, APPLY 2 GRAMS TOPICALLY 4 TIMES DAILY TO KNEE, Disp: 100 g, Rfl: 2   fluticasone  (FLONASE ) 50 MCG/ACT nasal spray, Place 1 spray into both nostrils daily., Disp: 16 g, Rfl: 12   Fluticasone -Umeclidin-Vilant (TRELEGY ELLIPTA ) 100-62.5-25 MCG/ACT AEPB, INHALE 1 PUFF INTO THE LUNGS DAILY, Disp: 180 each, Rfl: 4   formoterol  (PERFOROMIST ) 20 MCG/2ML nebulizer solution, Take 20 mcg by nebulization every 12 (twelve)  hours., Disp: , Rfl:    glucose blood (ONETOUCH ULTRA) test strip, TESTING 8 TIMES DAILY, Disp: 800 strip, Rfl: 1   hydroxypropyl methylcellulose / hypromellose (ISOPTO TEARS / GONIOVISC) 2.5 % ophthalmic solution, Place 2 drops into both eyes as needed for dry eyes., Disp: 15 mL, Rfl: 12   insulin  glargine (LANTUS ) 100 UNIT/ML injection, Inject 24 Units into the skin daily., Disp: , Rfl:    insulin  lispro (HUMALOG ) 100 UNIT/ML injection, Inject 22 Units into the skin 3 (three) times daily before meals. (Patient taking differently: Inject 22 Units into the skin 3 (three) times daily before meals. Takes per sliding scale provided by Dr Faythe.), Disp: , Rfl:    lidocaine  (LIDODERM ) 5 %, Place 1 patch  onto the skin daily. Remove & Discard patch within 12 hours or as directed by MD, Disp: 5 patch, Rfl: 0   linaclotide  (LINZESS ) 290 MCG CAPS capsule, Take 290 mcg by mouth as needed., Disp: , Rfl:    metoprolol  tartrate (LOPRESSOR ) 25 MG tablet, TAKE 1/2 TABLET BY MOUTH TWICE A DAY (1/2AM+1/2PM) (Patient taking differently: Take 12.5 mg by mouth 2 (two) times daily.), Disp: 48 tablet, Rfl: 1   mupirocin  ointment (BACTROBAN ) 2 %, Apply 1 Application topically 2 (two) times daily. To affected area till better, Disp: 22 g, Rfl: 0   nitroGLYCERIN  (NITROSTAT ) 0.4 MG SL tablet, Place 1 tablet (0.4 mg total) under the tongue every 5 (five) minutes as needed for chest pain., Disp: 25 tablet, Rfl: 3   pregabalin  (LYRICA ) 25 MG capsule, Take 1 capsule (25 mg total) by mouth 2 (two) times daily. (Patient not taking: Reported on 12/22/2023), Disp: 60 capsule, Rfl: 5   silver  sulfADIAZINE  (SSD) 1 % cream, APPLY PEA SIZED AMOUNT TOPICALLY TO WOUND DAILY, Disp: 50 g, Rfl: 0 Allergies  Allergen Reactions   Empagliflozin  Rash    Patient reports yeast.    Ibuprofen Other (See Comments)    Stomach pain      Social History   Socioeconomic History   Marital status: Significant Other    Spouse name: Not on file    Number of children: Not on file   Years of education: Not on file   Highest education level: Not on file  Occupational History   Occupation: Disabled     Comment: previously worked in delivery   Tobacco Use   Smoking status: Former    Current packs/day: 0.00    Average packs/day: 1.5 packs/day for 30.0 years (45.0 ttl pk-yrs)    Types: Cigarettes    Start date: 10/21/1974    Quit date: 10/20/2004    Years since quitting: 19.2    Passive exposure: Never   Smokeless tobacco: Never  Vaping Use   Vaping status: Never Used  Substance and Sexual Activity   Alcohol use: Yes    Comment: occasional   Drug use: No   Sexual activity: Not Currently  Other Topics Concern   Not on file  Social History Narrative   Lives with significant other x 30+ years, apartment friends smoke. Disability. Is disabled secondary to his work-related injury.       Has over 20 grandchildren    Social Drivers of Corporate Investment Banker Strain: Low Risk  (12/04/2022)   Overall Financial Resource Strain (CARDIA)    Difficulty of Paying Living Expenses: Not hard at all  Food Insecurity: No Food Insecurity (07/17/2023)   Hunger Vital Sign    Worried About Running Out of Food in the Last Year: Never true    Ran Out of Food in the Last Year: Never true  Transportation Needs: Unmet Transportation Needs (12/18/2023)   PRAPARE - Administrator, Civil Service (Medical): Yes    Lack of Transportation (Non-Medical): No  Physical Activity: Insufficiently Active (03/13/2023)   Exercise Vital Sign    Days of Exercise per Week: 5 days    Minutes of Exercise per Session: 10 min  Stress: No Stress Concern Present (12/04/2022)   [employer] of Occupational Health - Occupational Stress Questionnaire    Feeling of Stress : Not at all  Social Connections: Socially Isolated (02/18/2023)   Social Connection and Isolation Panel    Frequency of Communication with Friends and Family: Three  times a week     Frequency of Social Gatherings with Friends and Family: Three times a week    Attends Religious Services: Never    Active Member of Clubs or Organizations: No    Attends Banker Meetings: Never    Marital Status: Never married  Intimate Partner Violence: Not At Risk (07/17/2023)   Humiliation, Afraid, Rape, and Kick questionnaire    Fear of Current or Ex-Partner: No    Emotionally Abused: No    Physically Abused: No    Sexually Abused: No    Physical Exam      Future Appointments  Date Time Provider Department Center  01/05/2024 11:00 AM Carla Milling, RPH-CPP [facility] None  01/13/2024 10:00 AM Christine Rush, DPM [facility]  01/28/2024 10:40 AM Bensimhon, Toribio SAUNDERS, MD [facility] None  02/02/2024 11:30 AM GI-315 MR 1 [id number] GI-[address] Izetta Quivers, Paramedic [phone] [facility] Paramedic  01/05/24

## 2024-01-05 NOTE — Telephone Encounter (Signed)
 Spoke with pharmacist at [facility] - they are requesting updated Rxs for Potassium and famotidine . These prescriptions were last filled at [facility] in September and October for 90 days. Unfortunately because patient has bottles of these meds at home what should have medication remaining in them to last until December 2025 and January 2026 they are not able to be refilled yet.   Potassium - filled for #360 = 90 DS on 11/19/2023 Famotidine  20mg  - filled for #90 = 90 DS on 11/10/2023  I am requesting updated Rx's to be sent to [facility] from his cardiologist office so that they might be added to either his December or January adherence packs.   He will need torsmide soon  Torsemide  - filled for #180 tablets on 11/27/2023 - at the time this was a 90 DS supply but based on current directions it is a 45 day supply. (Would last until around 01/10/2024)   Discussed above with Izetta Quivers - paramedic / CMA that visited with patient today.  I have not been able to reach patient after 3 attempts - LM on Vm with my CB # [phone]

## 2024-01-06 NOTE — Telephone Encounter (Signed)
 Papers have been reviewed and signed by Dr. Kennyth. Forms have been faxed back to [phone] and placed in the scan folder to be sent to bulk scanning and into patients chart.

## 2024-01-11 ENCOUNTER — Telehealth: Payer: Self-pay | Admitting: Sports Medicine

## 2024-01-11 ENCOUNTER — Other Ambulatory Visit: Payer: Self-pay | Admitting: Sports Medicine

## 2024-01-11 DIAGNOSIS — M5030 Other cervical disc degeneration, unspecified cervical region: Secondary | ICD-10-CM

## 2024-01-11 DIAGNOSIS — M542 Cervicalgia: Secondary | ICD-10-CM

## 2024-01-11 MED ORDER — TRAMADOL HCL 50 MG PO TABS: 50.0000 mg | ORAL_TABLET | Freq: Three times a day (TID) | ORAL | 0 refills | Status: DC | PRN

## 2024-01-11 NOTE — Telephone Encounter (Signed)
 Pt states Dr. Leonce was going to prescribe Tramadol  for breakthrough pain but I do not see this. Please send Tramadol  rx to [facility] as Tylenol  is not controlling the pain.

## 2024-01-11 NOTE — Telephone Encounter (Signed)
Patient informed via phone.

## 2024-01-11 NOTE — Progress Notes (Signed)
 Tramadol  sent into pharmacy to be used for severe breakthrough pain.

## 2024-01-13 ENCOUNTER — Encounter: Payer: Self-pay | Admitting: Podiatry

## 2024-01-13 ENCOUNTER — Ambulatory Visit: Admitting: Podiatry

## 2024-01-13 DIAGNOSIS — M79671 Pain in right foot: Secondary | ICD-10-CM | POA: Diagnosis not present

## 2024-01-13 DIAGNOSIS — M79672 Pain in left foot: Secondary | ICD-10-CM | POA: Diagnosis not present

## 2024-01-13 DIAGNOSIS — B351 Tinea unguium: Secondary | ICD-10-CM | POA: Diagnosis not present

## 2024-01-13 MED ORDER — POTASSIUM CHLORIDE CRYS ER 20 MEQ PO TBCR: 40.0000 meq | EXTENDED_RELEASE_TABLET | Freq: Two times a day (BID) | ORAL | 1 refills | Status: DC

## 2024-01-13 MED ORDER — TORSEMIDE 20 MG PO TABS: 40.0000 mg | ORAL_TABLET | Freq: Two times a day (BID) | ORAL | 1 refills | Status: AC

## 2024-01-13 NOTE — Progress Notes (Signed)
 Patient presents for evaluation and treatment of tenderness and some redness around nails feet.  Tenderness around toes with walking and wearing shoes.  Physical exam:  General appearance: Alert, pleasant, and in no acute distress.  Vascular: Pedal pulses: DP 2/4 B/L, PT 0/4 B/L.  Severe edema lower legs bilaterally.  Capillary refill time immediate bilaterally  Neurologic:  Dermatologic:  Nails thickened, disfigured, discolored 1-5 BL with subungual debris.  Redness and hypertrophic nail folds along nail folds bilaterally but no signs of drainage or infection.  Small resolving hematoma blister on the dorsal aspect of the fourth toe left no signs of infection.  Musculoskeletal:     Diagnosis: 1. Painful onychomycotic nails 1 through 5 bilaterally. 2. Pain toes 1 through 5 bilaterally.  Plan: -Debrided onychomycotic nails 1 through 5 bilaterally.  Sharply debrided nails with nail clipper and reduced with a power bur.  Return 3 months [facility]

## 2024-01-21 ENCOUNTER — Other Ambulatory Visit (HOSPITAL_COMMUNITY): Payer: Self-pay

## 2024-01-21 NOTE — Progress Notes (Signed)
 Paramedicine Encounter    Patient ID: [name] [name], male    DOB: 09-03-1955, 68 y.o.   MRN: [id number]   Complaints-none   Edema-slight- not worn compression stockings today  Compliance with meds-yes  Pill box filled-n/a  If so, by whom-bubble packs   Refills needed-none     Pt reports he is doing ok, just having issues with constipation.  He denies increased sob, no dizziness, no c/p.   He has HHN come out, she was out here on Tuesday.  He has clinic appoint next week.   He has his bubble packs now, but its missing the potassium and the acid reducer for now, he got a 90 day supply not long ago and once the insurance will approve it again then it will be added back in bubble packs.  B/p lower again today.  HHN got 120 systolic this week.  No bleeding issues. Pt reports sleeping ok, appetite ok.  No increased sob.  No open wounds on legs.  Pt is going to reach out to insurance to see if he has rides left to use this year. If not, he will let me know.    CBG PTA-236  BP 94/62   Pulse 84   Resp 18   Wt 286 lb (129.7 kg)   SpO2 93%   BMI 39.89 kg/m  Weight yesterday-289 Last visit weight-286  Patient Care Team: Kennyth Worth HERO, MD as PCP - General (Family Medicine) Jeffrie Oneil BROCKS, MD as PCP - Cardiology (Cardiology) Faythe Purchase, MD as Consulting Physician (Endocrinology) Loreda Hacker, DPM as Consulting Physician (Podiatry) Neysa Reggy BIRCH, MD as Consulting Physician (Pulmonary Disease) [name], Elvie LITTIE, NP (Inactive) as Nurse Practitioner (Vascular Surgery) [facility] as Consulting Physician Carla Milling, RPH-CPP (Pharmacist)  Patient Active Problem List   Diagnosis Date Noted   GERD (gastroesophageal reflux disease) 11/06/2023   Stasis dermatitis 06/13/2022   Constipation 05/30/2022   Debility 05/30/2022   Severe tricuspid regurgitation 04/19/2022   Pulmonary embolism (HCC) 04/19/2022   Chronic right-sided heart failure (HCC)  04/19/2022   Decreased hearing 04/15/2022   Ectopic atrial tachycardia 02/12/2021   Simple chronic bronchitis (HCC) 12/17/2020   PND (post-nasal drip) 12/17/2020   Cough 10/31/2019   Intertrigo 10/31/2019   Coronary artery disease of bypass graft of native heart with stable angina pectoris    Type 2 diabetes mellitus with other specified complication (HCC), followed by Dr. Faythe 10/14/2018   Chronic heart failure with preserved ejection fraction (HCC) 08/29/2017   Varicose vein of leg 04/13/2017   Polyneuropathy associated with underlying disease    Osteoarthritis 01/21/2017   Seasonal and perennial allergic rhinitis 07/17/2016   Ulnar neuritis, left 03/27/2016   PVD (peripheral vascular disease) (HCC) 04/30/2009   Morbid obesity (HCC)    HLD (hyperlipidemia) 01/04/2007   Obstructive sleep apnea 01/04/2007   HTN (hypertension) 01/04/2007   Current Medications[1] Allergies[2]    Social History   Socioeconomic History   Marital status: Significant Other    Spouse name: Not on file   Number of children: Not on file   Years of education: Not on file   Highest education level: Not on file  Occupational History   Occupation: Disabled     Comment: previously worked in delivery   Tobacco Use   Smoking status: Former    Current packs/day: 0.00    Average packs/day: 1.5 packs/day for 30.0 years (45.0 ttl pk-yrs)    Types: Cigarettes    Start date: 10/21/1974  Quit date: 10/20/2004    Years since quitting: 19.2    Passive exposure: Never   Smokeless tobacco: Never  Vaping Use   Vaping status: Never Used  Substance and Sexual Activity   Alcohol use: Yes    Comment: occasional   Drug use: No   Sexual activity: Not Currently  Other Topics Concern   Not on file  Social History Narrative   Lives with significant other x 30+ years, apartment friends smoke. Disability. Is disabled secondary to his work-related injury.       Has over 20 grandchildren    Social Drivers of  Health   Tobacco Use: Medium Risk (01/13/2024)   Patient History    Smoking Tobacco Use: Former    Smokeless Tobacco Use: Never    Passive Exposure: Never  Physicist, Medical Strain: Low Risk (12/04/2022)   Overall Financial Resource Strain (CARDIA)    Difficulty of Paying Living Expenses: Not hard at all  Food Insecurity: No Food Insecurity (07/17/2023)   Hunger Vital Sign    Worried About Running Out of Food in the Last Year: Never true    Ran Out of Food in the Last Year: Never true  Transportation Needs: Unmet Transportation Needs (12/18/2023)   Epic    Lack of Transportation (Medical): Yes    Lack of Transportation (Non-Medical): No  Physical Activity: Insufficiently Active (03/13/2023)   Exercise Vital Sign    Days of Exercise per Week: 5 days    Minutes of Exercise per Session: 10 min  Stress: No Stress Concern Present (12/04/2022)   [employer] of Occupational Health - Occupational Stress Questionnaire    Feeling of Stress : Not at all  Social Connections: Socially Isolated (02/18/2023)   Social Connection and Isolation Panel    Frequency of Communication with Friends and Family: Three times a week    Frequency of Social Gatherings with Friends and Family: Three times a week    Attends Religious Services: Never    Active Member of Clubs or Organizations: No    Attends Banker Meetings: Never    Marital Status: Never married  Intimate Partner Violence: Not At Risk (07/17/2023)   Humiliation, Afraid, Rape, and Kick questionnaire    Fear of Current or Ex-Partner: No    Emotionally Abused: No    Physically Abused: No    Sexually Abused: No  Depression (PHQ2-9): Low Risk (11/06/2023)   Depression (PHQ2-9)    PHQ-2 Score: 0  Alcohol Screen: Low Risk (03/13/2023)   Alcohol Screen    Last Alcohol Screening Score (AUDIT): 0  Housing: Low Risk (07/17/2023)   Housing Stability Vital Sign    Unable to Pay for Housing in the Last Year: No    Number of Times Moved in  the Last Year: 0    Homeless in the Last Year: No  Utilities: Not At Risk (07/17/2023)   AHC Utilities    Threatened with loss of utilities: No  Health Literacy: Adequate Health Literacy (07/17/2023)   B1300 Health Literacy    Frequency of need for help with medical instructions: Never    Physical Exam      Future Appointments  Date Time Provider Department Center  01/28/2024 10:40 AM Bensimhon, Toribio SAUNDERS, MD [facility] None  01/29/2024  2:45 PM Carla Milling, RPH-CPP [facility] None  02/02/2024 11:30 AM [facility] MR 1 [facility] [facility]  04/12/2024 10:15 AM Christine Rush, DPM [facility]       Izetta Quivers, Paramedic [phone] [facility] Paramedic  01/21/2024     [1]  Current Outpatient Medications:    Accu-Chek Softclix Lancets lancets, TEST MORNING, NOON AND BEDTIME, Disp: 100 each, Rfl: 3   albuterol  (VENTOLIN  HFA) 108 (90 Base) MCG/ACT inhaler, INHALE TWO PUFFS BY MOUTH EVERY SIX HOURS AS NEEDED FOR WHEEZING OR SHORTNESS OF BREATH, Disp: 8.5 g, Rfl: 5   B-D ULTRAFINE III SHORT PEN 31G X 8 MM MISC, USE AS DIRECTED, Disp: 100 each, Rfl: 5   Blood Glucose Monitoring Suppl DEVI, 1 each by Does not apply route in the morning, at noon, and at bedtime. May substitute to any manufacturer covered by patient's insurance.Dx E11.69, Disp: 1 each, Rfl: 0   ELIQUIS  5 MG TABS tablet, TAKE ONE TABLET BY MOUTH TWICE DAILY (AM+PM), Disp: 60 tablet, Rfl: 1   famotidine  (PEPCID ) 20 MG tablet, Take 1 tablet (20 mg total) by mouth daily., Disp: 90 tablet, Rfl: 2   Fluticasone -Umeclidin-Vilant (TRELEGY ELLIPTA ) 100-62.5-25 MCG/ACT AEPB, INHALE 1 PUFF INTO THE LUNGS DAILY, Disp: 180 each, Rfl: 4   formoterol  (PERFOROMIST ) 20 MCG/2ML nebulizer solution, Take 20 mcg by nebulization every 12 (twelve) hours., Disp: , Rfl:    insulin  glargine (LANTUS ) 100 UNIT/ML injection, Inject 24 Units into the skin daily., Disp: , Rfl:    losartan  (COZAAR ) 100 MG tablet, TAKE 1 TABLET (100 MG TOTAL) BY  MOUTH DAILY(AM), Disp: 30 tablet, Rfl: 1   magnesium  oxide (MAG-OX) 400 MG tablet, Take 1 tablet (400 mg total) by mouth daily., Disp: 90 tablet, Rfl: 3   potassium chloride  SA (KLOR-CON  M) 20 MEQ tablet, Take 2 tablets (40 mEq total) by mouth 2 (two) times daily., Disp: 120 tablet, Rfl: 1   rosuvastatin  (CRESTOR ) 20 MG tablet, TAKE 1 TABLET (20 MG TOTAL) BY MOUTH EVERY EVENING., Disp: 30 tablet, Rfl: 1   spironolactone  (ALDACTONE ) 25 MG tablet, Take 1 tablet (25 mg total) by mouth daily., Disp: 90 tablet, Rfl: 3   torsemide  (DEMADEX ) 20 MG tablet, Take 2 tablets (40 mg total) by mouth 2 (two) times daily., Disp: 120 tablet, Rfl: 1   Continuous Glucose Receiver (DEXCOM G7 RECEIVER) DEVI, Use to check blood glucose several times per day, Disp: 1 each, Rfl: 0   Continuous Glucose Sensor (DEXCOM G7 SENSOR) MISC, Use to check blood glucose several times per day, Disp: 3 each, Rfl: 1   diclofenac  Sodium (VOLTAREN ) 1 % GEL, APPLY 2 GRAMS TOPICALLY 4 TIMES DAILY TO KNEE, Disp: 100 g, Rfl: 2   fluticasone  (FLONASE ) 50 MCG/ACT nasal spray, Place 1 spray into both nostrils daily., Disp: 16 g, Rfl: 12   glucose blood (ONETOUCH ULTRA) test strip, TESTING 8 TIMES DAILY, Disp: 800 strip, Rfl: 1   hydroxypropyl methylcellulose / hypromellose (ISOPTO TEARS / GONIOVISC) 2.5 % ophthalmic solution, Place 2 drops into both eyes as needed for dry eyes., Disp: 15 mL, Rfl: 12   insulin  lispro (HUMALOG ) 100 UNIT/ML injection, Inject 22 Units into the skin 3 (three) times daily before meals. (Patient taking differently: Inject 22 Units into the skin 3 (three) times daily before meals. Takes per sliding scale provided by Dr Faythe.), Disp: , Rfl:    lidocaine  (LIDODERM ) 5 %, Place 1 patch onto the skin daily. Remove & Discard patch within 12 hours or as directed by MD, Disp: 5 patch, Rfl: 0   linaclotide  (LINZESS ) 290 MCG CAPS capsule, Take 290 mcg by mouth as needed., Disp: , Rfl:    metoprolol  tartrate (LOPRESSOR ) 25 MG  tablet, TAKE 1/2 TABLET  BY MOUTH TWICE A DAY (1/2AM+1/2PM) (Patient taking differently: Take 12.5 mg by mouth 2 (two) times daily.), Disp: 48 tablet, Rfl: 1   mupirocin  ointment (BACTROBAN ) 2 %, Apply 1 Application topically 2 (two) times daily. To affected area till better, Disp: 22 g, Rfl: 0   nitroGLYCERIN  (NITROSTAT ) 0.4 MG SL tablet, Place 1 tablet (0.4 mg total) under the tongue every 5 (five) minutes as needed for chest pain., Disp: 25 tablet, Rfl: 3   pregabalin  (LYRICA ) 25 MG capsule, Take 1 capsule (25 mg total) by mouth 2 (two) times daily. (Patient not taking: Reported on 01/13/2024), Disp: 60 capsule, Rfl: 5   silver  sulfADIAZINE  (SSD) 1 % cream, APPLY PEA SIZED AMOUNT TOPICALLY TO WOUND DAILY, Disp: 50 g, Rfl: 0 [2]  Allergies Allergen Reactions   Empagliflozin  Rash    Patient reports yeast.    Ibuprofen Other (See Comments)    Stomach pain

## 2024-01-28 ENCOUNTER — Encounter (HOSPITAL_COMMUNITY): Payer: Self-pay | Admitting: Internal Medicine

## 2024-01-28 ENCOUNTER — Other Ambulatory Visit: Payer: Self-pay | Admitting: Internal Medicine

## 2024-01-28 ENCOUNTER — Ambulatory Visit (HOSPITAL_COMMUNITY)
Admission: RE | Admit: 2024-01-28 | Discharge: 2024-01-28 | Disposition: A | Discharge status: Home / Self Care | Source: Ambulatory Visit | Attending: Internal Medicine | Admitting: Internal Medicine

## 2024-01-28 VITALS — BP 100/60 | HR 73 | Wt 286.0 lb

## 2024-01-28 DIAGNOSIS — N1831 Chronic kidney disease, stage 3a: Secondary | ICD-10-CM | POA: Insufficient documentation

## 2024-01-28 DIAGNOSIS — I2510 Atherosclerotic heart disease of native coronary artery without angina pectoris: Secondary | ICD-10-CM | POA: Insufficient documentation

## 2024-01-28 DIAGNOSIS — I071 Rheumatic tricuspid insufficiency: Secondary | ICD-10-CM | POA: Diagnosis not present

## 2024-01-28 DIAGNOSIS — Z7982 Long term (current) use of aspirin: Secondary | ICD-10-CM | POA: Insufficient documentation

## 2024-01-28 DIAGNOSIS — Z86711 Personal history of pulmonary embolism: Secondary | ICD-10-CM | POA: Diagnosis not present

## 2024-01-28 DIAGNOSIS — I272 Pulmonary hypertension, unspecified: Secondary | ICD-10-CM | POA: Diagnosis not present

## 2024-01-28 DIAGNOSIS — I50812 Chronic right heart failure: Secondary | ICD-10-CM | POA: Diagnosis present

## 2024-01-28 DIAGNOSIS — I361 Nonrheumatic tricuspid (valve) insufficiency: Secondary | ICD-10-CM | POA: Insufficient documentation

## 2024-01-28 DIAGNOSIS — E785 Hyperlipidemia, unspecified: Secondary | ICD-10-CM | POA: Insufficient documentation

## 2024-01-28 DIAGNOSIS — Z8619 Personal history of other infectious and parasitic diseases: Secondary | ICD-10-CM | POA: Diagnosis not present

## 2024-01-28 DIAGNOSIS — Z951 Presence of aortocoronary bypass graft: Secondary | ICD-10-CM | POA: Insufficient documentation

## 2024-01-28 DIAGNOSIS — G4733 Obstructive sleep apnea (adult) (pediatric): Secondary | ICD-10-CM | POA: Insufficient documentation

## 2024-01-28 DIAGNOSIS — E1122 Type 2 diabetes mellitus with diabetic chronic kidney disease: Secondary | ICD-10-CM | POA: Insufficient documentation

## 2024-01-28 DIAGNOSIS — I130 Hypertensive heart and chronic kidney disease with heart failure and stage 1 through stage 4 chronic kidney disease, or unspecified chronic kidney disease: Secondary | ICD-10-CM | POA: Diagnosis not present

## 2024-01-28 DIAGNOSIS — Z79899 Other long term (current) drug therapy: Secondary | ICD-10-CM | POA: Diagnosis not present

## 2024-01-28 DIAGNOSIS — E118 Type 2 diabetes mellitus with unspecified complications: Secondary | ICD-10-CM

## 2024-01-28 DIAGNOSIS — I4719 Other supraventricular tachycardia: Secondary | ICD-10-CM | POA: Insufficient documentation

## 2024-01-28 DIAGNOSIS — Z794 Long term (current) use of insulin: Secondary | ICD-10-CM

## 2024-01-28 LAB — BASIC METABOLIC PANEL WITH GFR
Anion gap: 11 (ref 5–15)
BUN: 25 mg/dL — ABNORMAL HIGH (ref 8–23)
CO2: 26 mmol/L (ref 22–32)
Calcium: 9.5 mg/dL (ref 8.9–10.3)
Chloride: 99 mmol/L (ref 98–111)
Creatinine, Ser: 1.34 mg/dL — ABNORMAL HIGH (ref 0.61–1.24)
GFR, Estimated: 57 mL/min — ABNORMAL LOW (ref >60)
Glucose, Bld: 158 mg/dL — ABNORMAL HIGH (ref 70–99)
Potassium: 3.3 mmol/L — ABNORMAL LOW (ref 3.5–5.1)
Sodium: 136 mmol/L (ref 135–145)

## 2024-01-28 LAB — PRO BRAIN NATRIURETIC PEPTIDE: Pro Brain Natriuretic Peptide: 219 pg/mL (ref <300.0)

## 2024-01-28 NOTE — Telephone Encounter (Signed)
 Copied from CRM #[id number]. Topic: Clinical - Medication Refill >> Jan 28, 2024  2:09 PM Devaughn RAMAN wrote: Medication: Fluticasone -Umeclidin-Vilant (TRELEGY ELLIPTA ) 100-62.5-25 MCG/ACT AEPB  Has the patient contacted their pharmacy? Yes (Agent: If no, request that the patient contact the pharmacy for the refill. If patient does not wish to contact the pharmacy document the reason why and proceed with request.) (Agent: If yes, when and what did the pharmacy advise?)  This is the patient's preferred pharmacy:  [facility] [address] [place], [place], [place] [address] [phone]  Is this the correct pharmacy for this prescription? Yes If no, delete pharmacy and type the correct one.   Has the prescription been filled recently? No  Is the patient out of the medication? Yes  Has the patient been seen for an appointment in the last year OR does the patient have an upcoming appointment? Yes  Can we respond through MyChart? Yes  Agent: Please be advised that Rx refills may take up to 3 business days. We ask that you follow-up with your pharmacy.

## 2024-01-28 NOTE — Addendum Note (Signed)
 Encounter addended by: Lillianna Sabel B, RN on: 01/28/2024 11:27 AM  Actions taken: Clinical Note Signed, Order list changed, Diagnosis association updated, Charge Capture section accepted

## 2024-01-28 NOTE — Patient Instructions (Signed)
 Medication Changes:  No Changes In Medications at this time.   Lab Work:  Labs done today, your results will be available in MyChart, we will contact you for abnormal readings.  Testing/Procedures:  VQ SCAN--SCHEDULING WILL CALL TO ARRANGE ONCE APPROVED WITH INSURANCE   Follow-Up in: 3 MONTHS AS SCHEDULED   At the [facility], you and your health needs are our priority. We have a designated team specialized in the treatment of Heart Failure. This Care Team includes your primary Heart Failure Specialized Cardiologist (physician), Advanced Practice Providers (APPs- Physician Assistants and Nurse Practitioners), and Pharmacist who all work together to provide you with the care you need, when you need it.   You may see any of the following providers on your designated Care Team at your next follow up:  Dr. Toribio Fuel Dr. Ezra Shuck Dr. Odis Brownie Greig Mosses, NP Caffie Shed, [place] [facility] [place], [place] Beckey Coe, NP Jordan Lee, NP Tinnie Redman, PharmD   Please be sure to bring in all your medications bottles to every appointment.   Need to Contact Us :  If you have any questions or concerns before your next appointment please send us  a message through [place] or call our office at [phone].    TO LEAVE A MESSAGE FOR THE NURSE SELECT OPTION 2, PLEASE LEAVE A MESSAGE INCLUDING: YOUR NAME DATE OF BIRTH CALL BACK NUMBER REASON FOR CALL**this is important as we prioritize the call backs  YOU WILL RECEIVE A CALL BACK THE SAME DAY AS LONG AS YOU CALL BEFORE 4:00 PM

## 2024-01-28 NOTE — Progress Notes (Signed)
 [facility] NOTE  Referring Physician: Kennyth Worth HERO, MD  Primary Care: Kennyth Worth HERO, MD Primary Cardiologist: Dr. Jeffrie  CC: Severe tricuspid regurgitation  HPI: [name] [name] is a 68 y.o. male with coronary artery disease, ectopic atrial tachycardia, uncontrolled hypertension, hyperlipidemia, obstructive sleep apnea no longer using CPAP, history of pulmonary embolism and severe RV enlargement with severe tricuspid regurgitation.  He was admitted in 3/24 with acute hypoxic respiratory failure in the setting of RV failure, strep bacteremia and new onset severe tricuspid regurgitation with a VQ scan suggestive for right upper lobe pulmonary embolism.  Today he returns for HF follow up. Now followed by Paramedicine. Feels much better. Can do all ADLs with Rollator. LE edema has improved but still with some swelling to LLE. No CP Compliant with meds. No    PHYSICAL EXAM: Vitals:   01/28/24 1028  BP: 100/60  Pulse: 73  SpO2: 94%   Wt Readings from Last 3 Encounters:  01/28/24 129.7 kg (286 lb)  01/21/24 129.7 kg (286 lb)  01/05/24 129.7 kg (286 lb)    General:  Sitting up. No resp difficulty HEENT: normal Neck: supple. no JVD.  Cor: Regular rate & rhythm. No rubs, gallops or murmurs. Lungs: clear Abdomen: obese soft, nontender, nondistended.Good bowel sounds. Thickened skin on back  Extremities: no cyanosis, clubbing, rash, tr woody edema Neuro: alert & orientedx3, cranial nerves grossly intact. moves all 4 extremities w/o difficulty. Affect pleasant  DATA REVIEW  ECG: N/a  ECHO: 10/07/22: LVEF 55-60%, severely dilated RV, severely dilated RA, severe TR As per my personal interpretation  CATH: 01/09/23:  HEMODYNAMICS: RA:                  6 mmHg with large V waves RV:                  40/7 mmHg PA:                  41/18 mmHg (22 mean) PCWP:            10 mmHg (mean)                                      Estimated Fick CO/CI   8.3 L/min, 3.4  L/min/m2 Thermodilution CO/CI   6.6 L/min, 2.7 L/min/m2                                              TPG                 12  mmHg                                            PVR                 1.4-1.8 Wood Units  PAPi                3.8       IMPRESSION: Normal pre and post capillary filling pressures Normal PVR Normal cardiac output & index Large V waves in RA consistent with severe TR   ASSESSMENT & PLAN:  Severe  tricuspid regurgitation / RV failure - TTE dating back to 05/07/2017 with mild TR; normal leaflet coaptation.  - Admitted in 04/2022 w/ hypoxic respiratory failure with V/Q scan + for PE; blood cx with strep pyogenous. TTE during this time with RV failure & severe tricuspid regurgitation with no signs of endocarditis effect the TV by TEE. Suspect TR is secondary to poor cooaptation from acute dilation of the RV.  - Echo 10/25 EF 60-65% RV mod reduced flattened septum moderate TR RVSP Personally reviewed - suspect TR is functional from dilated RV/RA. Now moderate. Can consider referral for Tri-Clip as needed  - NYHA II-III Volume ok  - spironolactone  25mg  daily  - failed Jardiance  due to yeast infections   2. Pulmonary HTN  - RHC cath as above with mild PAH with normal PVR - ? Likely WHO GROUP III but question component of CTEPH - Needs CPAP - Repeat VQ   Coronary artery disease status post CABG -  CABG x 4 in 2005 -  No s/s angina Continue ASA 81mg  daily & crestor  20mg  daily.   Pulmonary embolism - Diagnosed by V/Q scan in 04/2022 with right upper lobe PE; lower extremity dopplers negative.  - Diagnosed with pulmonary embolism in March 2024.  Hypertension - Blood pressure well controlled. Continue current regimen.   CKD Stage IIIa - Scr baseline 1.5 - unable to tolerate Jardiance  - labs today  Obstructive sleep apnea - Has f/u with PulmonaryHe needs to get restarted on CPAP. Previously had hard time paying for CPAP. - I encouraged him to f/u on this     DM II -Hgb A1C 12 b-> 10.0 in 11/15 Personally reviewed - manged PCP  Continue HF Paramedicine.   Toribio Fuel MD 11:05 AM

## 2024-01-29 ENCOUNTER — Other Ambulatory Visit: Payer: Self-pay | Admitting: Family Medicine

## 2024-01-29 ENCOUNTER — Telehealth: Payer: Self-pay | Admitting: Pharmacist

## 2024-01-29 ENCOUNTER — Other Ambulatory Visit: Payer: Self-pay | Admitting: Internal Medicine

## 2024-01-29 ENCOUNTER — Other Ambulatory Visit: Admitting: Pharmacist

## 2024-01-29 DIAGNOSIS — E782 Mixed hyperlipidemia: Secondary | ICD-10-CM

## 2024-01-29 MED ORDER — POTASSIUM CHLORIDE CRYS ER 20 MEQ PO TBCR: EXTENDED_RELEASE_TABLET | ORAL | 5 refills | Status: AC

## 2024-01-29 NOTE — Telephone Encounter (Signed)
 Patient is due to refill adherence packs thru [facility] 02/01/2024 - needs updated Rx's for rosuvastatin , Losartan  and Eliquis . Rx's pending for PCP to approve.

## 2024-01-29 NOTE — Telephone Encounter (Signed)
 Attempt was made to contact patient by phone today for follow up by Clinical Pharmacist regarding medication management.  Unable to reach patient. LM on VM with my contact number [phone].   I called his pharmacy to see when next adherence packs would be delivered - plan per [facility] is to fill 12/22 and delivery same day.  They did mention he needs an updated Rx sent in for losartan  - Sent request to his cardio office.

## 2024-01-29 NOTE — Addendum Note (Signed)
 Addended by: TITA AGE B on: 01/29/2024 03:04 PM   Modules accepted: Orders

## 2024-02-01 MED ORDER — LOSARTAN POTASSIUM 100 MG PO TABS: 100.0000 mg | ORAL_TABLET | Freq: Every day | ORAL | 1 refills | Status: DC

## 2024-02-01 NOTE — Telephone Encounter (Signed)
 Courtesy refill, pt need an ov for further refills.

## 2024-02-02 ENCOUNTER — Ambulatory Visit
Admission: RE | Admit: 2024-02-02 | Discharge: 2024-02-02 | Disposition: A | Discharge status: Home / Self Care | Source: Ambulatory Visit | Attending: Sports Medicine | Admitting: Sports Medicine

## 2024-02-02 DIAGNOSIS — M542 Cervicalgia: Secondary | ICD-10-CM

## 2024-02-02 DIAGNOSIS — M5030 Other cervical disc degeneration, unspecified cervical region: Secondary | ICD-10-CM

## 2024-02-08 ENCOUNTER — Other Ambulatory Visit: Payer: Self-pay | Admitting: Sports Medicine

## 2024-02-08 DIAGNOSIS — M542 Cervicalgia: Secondary | ICD-10-CM

## 2024-02-08 DIAGNOSIS — M5030 Other cervical disc degeneration, unspecified cervical region: Secondary | ICD-10-CM

## 2024-02-15 ENCOUNTER — Encounter (HOSPITAL_COMMUNITY)
Admission: RE | Admit: 2024-02-15 | Discharge: 2024-02-15 | Disposition: A | Discharge status: Home / Self Care | Source: Ambulatory Visit | Attending: Internal Medicine | Admitting: Internal Medicine

## 2024-02-15 ENCOUNTER — Ambulatory Visit (HOSPITAL_COMMUNITY)
Admission: RE | Admit: 2024-02-15 | Discharge: 2024-02-15 | Disposition: A | Discharge status: Home / Self Care | Source: Ambulatory Visit | Attending: Internal Medicine | Admitting: Internal Medicine

## 2024-02-15 DIAGNOSIS — I50812 Chronic right heart failure: Secondary | ICD-10-CM | POA: Diagnosis present

## 2024-02-15 MED ORDER — TECHNETIUM TO 99M ALBUMIN AGGREGATED
4.4000 | Freq: Once | INTRAVENOUS | Status: AC | PRN
  Administered 2024-02-15: 4.4 via INTRAVENOUS

## 2024-02-16 ENCOUNTER — Ambulatory Visit: Payer: Self-pay | Admitting: Sports Medicine

## 2024-02-18 ENCOUNTER — Encounter: Payer: Self-pay | Admitting: Internal Medicine

## 2024-02-18 ENCOUNTER — Other Ambulatory Visit (HOSPITAL_COMMUNITY): Payer: Self-pay

## 2024-02-18 NOTE — Progress Notes (Signed)
 Paramedicine Encounter    Patient ID: [name] [name], male    DOB: 1955/08/28, 69 y.o.   MRN: [id number]   Complaints-denies  Edema-slight   Compliance with meds-yes  Pill box filled-bubble packs  If so, by whom-pharmacy   Refills needed-none  Pt reports he's doing ok. He reports weight is up a couple lbs but he has been drinking a lot of fluids and eating in middle of night b/c of his CBG's dropping some.  He was seen at clinic and his potassium was increased. Pharmacy made adjustments in pill box for that.  Pt denies c/p, no dizziness, no sob.   He has had some neck issues and pain and he had MRI and has f/u for that issue.   His nurse is done with visits.  He wears compression stockings.  Still have some swelling but not bad.  He's been using garlic salt to season his food, but I advised him to stop using the salt and get the powder.   Will f/u in a couple wks.    BP 112/66   Pulse 91   Resp 16   Wt 289 lb (131.1 kg)   SpO2 95%   BMI 40.31 kg/m  Weight yesterday-289 Last visit weight-286 @ clinic   Patient Care Team: Kennyth Worth HERO, MD as PCP - General (Family Medicine) Jeffrie Oneil BROCKS, MD as PCP - Cardiology (Cardiology) Faythe Purchase, MD as Consulting Physician (Endocrinology) Loreda Hacker, DPM as Consulting Physician (Podiatry) Neysa Reggy BIRCH, MD as Consulting Physician (Pulmonary Disease) [name], Elvie LITTIE, NP (Inactive) as Nurse Practitioner (Vascular Surgery) [facility] as Consulting Physician Carla Milling, RPH-CPP (Pharmacist)  Patient Active Problem List   Diagnosis Date Noted   GERD (gastroesophageal reflux disease) 11/06/2023   Stasis dermatitis 06/13/2022   Constipation 05/30/2022   Debility 05/30/2022   Severe tricuspid regurgitation 04/19/2022   Pulmonary embolism (HCC) 04/19/2022   Chronic right-sided heart failure (HCC) 04/19/2022   Decreased hearing 04/15/2022   Ectopic atrial tachycardia 02/12/2021   Simple  chronic bronchitis (HCC) 12/17/2020   PND (post-nasal drip) 12/17/2020   Cough 10/31/2019   Intertrigo 10/31/2019   Coronary artery disease of bypass graft of native heart with stable angina pectoris    Type 2 diabetes mellitus with other specified complication (HCC), followed by Dr. Faythe 10/14/2018   Chronic heart failure with preserved ejection fraction (HCC) 08/29/2017   Varicose vein of leg 04/13/2017   Polyneuropathy associated with underlying disease    Osteoarthritis 01/21/2017   Seasonal and perennial allergic rhinitis 07/17/2016   Ulnar neuritis, left 03/27/2016   PVD (peripheral vascular disease) (HCC) 04/30/2009   Morbid obesity (HCC)    HLD (hyperlipidemia) 01/04/2007   Obstructive sleep apnea 01/04/2007   HTN (hypertension) 01/04/2007   Current Medications[1] Allergies[2]    Social History   Socioeconomic History   Marital status: Significant Other    Spouse name: Not on file   Number of children: Not on file   Years of education: Not on file   Highest education level: Not on file  Occupational History   Occupation: Disabled     Comment: previously worked in delivery   Tobacco Use   Smoking status: Former    Current packs/day: 0.00    Average packs/day: 1.5 packs/day for 30.0 years (45.0 ttl pk-yrs)    Types: Cigarettes    Start date: 10/21/1974    Quit date: 10/20/2004    Years since quitting: 19.3    Passive exposure: Never  Smokeless tobacco: Never  Vaping Use   Vaping status: Never Used  Substance and Sexual Activity   Alcohol use: Yes    Comment: occasional   Drug use: No   Sexual activity: Not Currently  Other Topics Concern   Not on file  Social History Narrative   Lives with significant other x 30+ years, apartment friends smoke. Disability. Is disabled secondary to his work-related injury.       Has over 20 grandchildren    Social Drivers of Health   Tobacco Use: Medium Risk (01/28/2024)   Patient History    Smoking Tobacco Use:  Former    Smokeless Tobacco Use: Never    Passive Exposure: Never  Physicist, Medical Strain: Low Risk (12/04/2022)   Overall Financial Resource Strain (CARDIA)    Difficulty of Paying Living Expenses: Not hard at all  Food Insecurity: No Food Insecurity (07/17/2023)   Hunger Vital Sign    Worried About Running Out of Food in the Last Year: Never true    Ran Out of Food in the Last Year: Never true  Transportation Needs: Unmet Transportation Needs (12/18/2023)   Epic    Lack of Transportation (Medical): Yes    Lack of Transportation (Non-Medical): No  Physical Activity: Insufficiently Active (03/13/2023)   Exercise Vital Sign    Days of Exercise per Week: 5 days    Minutes of Exercise per Session: 10 min  Stress: No Stress Concern Present (12/04/2022)   [employer] of Occupational Health - Occupational Stress Questionnaire    Feeling of Stress : Not at all  Social Connections: Socially Isolated (02/18/2023)   Social Connection and Isolation Panel    Frequency of Communication with Friends and Family: Three times a week    Frequency of Social Gatherings with Friends and Family: Three times a week    Attends Religious Services: Never    Active Member of Clubs or Organizations: No    Attends Banker Meetings: Never    Marital Status: Never married  Intimate Partner Violence: Not At Risk (07/17/2023)   Humiliation, Afraid, Rape, and Kick questionnaire    Fear of Current or Ex-Partner: No    Emotionally Abused: No    Physically Abused: No    Sexually Abused: No  Depression (PHQ2-9): Low Risk (11/06/2023)   Depression (PHQ2-9)    PHQ-2 Score: 0  Alcohol Screen: Low Risk (03/13/2023)   Alcohol Screen    Last Alcohol Screening Score (AUDIT): 0  Housing: Low Risk (07/17/2023)   Housing Stability Vital Sign    Unable to Pay for Housing in the Last Year: No    Number of Times Moved in the Last Year: 0    Homeless in the Last Year: No  Utilities: Not At Risk (07/17/2023)   [facility]  Utilities    Threatened with loss of utilities: No  Health Literacy: Adequate Health Literacy (07/17/2023)   B1300 Health Literacy    Frequency of need for help with medical instructions: Never    Physical Exam      Future Appointments  Date Time Provider Department Center  02/23/2024 10:15 AM Leonce Katz, DO [facility] None  02/24/2024  8:30 AM Stretch, Lamar PARAS, MD [facility] [address]  03/01/2024  1:45 PM Carla Milling, RPH-CPP [facility] None  04/12/2024 10:15 AM Christine Rush, DPM [facility]  04/27/2024  9:30 AM [facility] PA/NP [facility] None       Izetta Quivers, Paramedic [phone] [facility] Paramedic  02/18/2024     [  1]  Current Outpatient Medications:    Accu-Chek Softclix Lancets lancets, TEST MORNING, NOON AND BEDTIME, Disp: 100 each, Rfl: 3   albuterol  (VENTOLIN  HFA) 108 (90 Base) MCG/ACT inhaler, INHALE TWO PUFFS BY MOUTH EVERY SIX HOURS AS NEEDED FOR WHEEZING OR SHORTNESS OF BREATH, Disp: 8.5 g, Rfl: 5   B-D ULTRAFINE III SHORT PEN 31G X 8 MM MISC, USE AS DIRECTED, Disp: 100 each, Rfl: 5   Blood Glucose Monitoring Suppl DEVI, 1 each by Does not apply route in the morning, at noon, and at bedtime. May substitute to any manufacturer covered by patient's insurance.Dx E11.69, Disp: 1 each, Rfl: 0   Continuous Glucose Receiver (DEXCOM G7 RECEIVER) DEVI, Use to check blood glucose several times per day, Disp: 1 each, Rfl: 0   Continuous Glucose Sensor (DEXCOM G7 SENSOR) MISC, Use to check blood glucose several times per day, Disp: 3 each, Rfl: 1   diclofenac  Sodium (VOLTAREN ) 1 % GEL, APPLY 2 GRAMS TOPICALLY 4 TIMES DAILY TO KNEE, Disp: 100 g, Rfl: 2   ELIQUIS  5 MG TABS tablet, TAKE ONE TABLET BY MOUTH TWICE DAILY (AM+PM), Disp: 60 tablet, Rfl: 1   famotidine  (PEPCID ) 20 MG tablet, Take 1 tablet (20 mg total) by mouth daily., Disp: 90 tablet, Rfl: 2   fluticasone  (FLONASE ) 50 MCG/ACT nasal spray, Place 1 spray into both nostrils daily., Disp: 16 g, Rfl: 12    Fluticasone -Umeclidin-Vilant (TRELEGY ELLIPTA ) 100-62.5-25 MCG/ACT AEPB, INHALE 1 PUFF INTO THE LUNGS DAILY, Disp: 180 each, Rfl: 0   formoterol  (PERFOROMIST ) 20 MCG/2ML nebulizer solution, Take 20 mcg by nebulization every 12 (twelve) hours., Disp: , Rfl:    glucose blood (ONETOUCH ULTRA) test strip, TESTING 8 TIMES DAILY, Disp: 800 strip, Rfl: 1   hydroxypropyl methylcellulose / hypromellose (ISOPTO TEARS / GONIOVISC) 2.5 % ophthalmic solution, Place 2 drops into both eyes as needed for dry eyes., Disp: 15 mL, Rfl: 12   insulin  glargine (LANTUS ) 100 UNIT/ML injection, Inject 24 Units into the skin daily., Disp: , Rfl:    insulin  lispro (HUMALOG ) 100 UNIT/ML injection, Inject 22 Units into the skin 3 (three) times daily before meals. (Patient taking differently: Inject 24 Units into the skin 3 (three) times daily before meals. Takes per sliding scale provided by Dr Faythe.), Disp: , Rfl:    lidocaine  (LIDODERM ) 5 %, Place 1 patch onto the skin daily. Remove & Discard patch within 12 hours or as directed by MD, Disp: 5 patch, Rfl: 0   linaclotide  (LINZESS ) 290 MCG CAPS capsule, Take 290 mcg by mouth as needed., Disp: , Rfl:    losartan  (COZAAR ) 100 MG tablet, Take 1 tablet (100 mg total) by mouth daily., Disp: 30 tablet, Rfl: 1   magnesium  oxide (MAG-OX) 400 MG tablet, Take 1 tablet (400 mg total) by mouth daily., Disp: 90 tablet, Rfl: 3   metoprolol  tartrate (LOPRESSOR ) 25 MG tablet, TAKE 1/2 TABLET BY MOUTH TWICE A DAY (1/2AM+1/2PM), Disp: 48 tablet, Rfl: 1   mupirocin  ointment (BACTROBAN ) 2 %, Apply 1 Application topically 2 (two) times daily. To affected area till better, Disp: 22 g, Rfl: 0   nitroGLYCERIN  (NITROSTAT ) 0.4 MG SL tablet, Place 1 tablet (0.4 mg total) under the tongue every 5 (five) minutes as needed for chest pain., Disp: 25 tablet, Rfl: 3   potassium chloride  SA (KLOR-CON  M) 20 MEQ tablet, Take 3 tablets (60 mEq total) by mouth in the morning AND 2 tablets (40 mEq total) every  evening., Disp: 180 tablet, Rfl: 5  pregabalin  (LYRICA ) 25 MG capsule, Take 1 capsule (25 mg total) by mouth 2 (two) times daily., Disp: 60 capsule, Rfl: 5   rosuvastatin  (CRESTOR ) 20 MG tablet, TAKE 1 TABLET (20 MG TOTAL) BY MOUTH EVERY EVENING., Disp: 30 tablet, Rfl: 1   silver  sulfADIAZINE  (SSD) 1 % cream, APPLY PEA SIZED AMOUNT TOPICALLY TO WOUND DAILY, Disp: 50 g, Rfl: 0   spironolactone  (ALDACTONE ) 25 MG tablet, Take 1 tablet (25 mg total) by mouth daily., Disp: 90 tablet, Rfl: 3   torsemide  (DEMADEX ) 20 MG tablet, Take 2 tablets (40 mg total) by mouth 2 (two) times daily., Disp: 120 tablet, Rfl: 1 [2]  Allergies Allergen Reactions   Empagliflozin  Rash    Patient reports yeast.    Ibuprofen Other (See Comments)    Stomach pain

## 2024-02-22 NOTE — Progress Notes (Unsigned)
 "               Odis Mace D.CLEMENTEEN [name] [address] [place] [address] Phone: [phone]   Assessment and Plan:     ***    Pertinent previous records reviewed include ***   Follow Up: ***     Subjective:   I, [name] [name], am serving as a neurosurgeon for Doctor Morene Mace   Chief Complaint: neck pain    HPI:    06/03/23 Patient is a 69 year old male with neck pain. Patient states he fell last week. Neck pain that radiates to his head and ears. Decreased ROM due to pain. Feels like a cramp. Icy hot and tylenol  dont help. Pain does go down to his arm , but hx of neuropathy. Pain does radiate down his back     12/24/2023 Patient states pain has gotten worse since the last time he was in   02/23/2024 Patient states    Relevant Historical Information: DM type II, hypertension, elevated BMI, PVD, chronic anticoagulation on Eliquis    Additional pertinent review of systems negative.  Current Medications[1]   Objective:     There were no vitals filed for this visit.    There is no height or weight on file to calculate BMI.    Physical Exam:    ***   Electronically signed by:  Odis Mace D.[facility]MENT[name] 12:38 PM 02/22/2024    [1]  Current Outpatient Medications:    Accu-Chek Softclix Lancets lancets, TEST MORNING, NOON AND BEDTIME, Disp: 100 each, Rfl: 3   albuterol  (VENTOLIN  HFA) 108 (90 Base) MCG/ACT inhaler, INHALE TWO PUFFS BY MOUTH EVERY SIX HOURS AS NEEDED FOR WHEEZING OR SHORTNESS OF BREATH, Disp: 8.5 g, Rfl: 5   B-D ULTRAFINE III SHORT PEN 31G X 8 MM MISC, USE AS DIRECTED, Disp: 100 each, Rfl: 5   Blood Glucose Monitoring Suppl DEVI, 1 each by Does not apply route in the morning, at noon, and at bedtime. May substitute to any manufacturer covered by patient's insurance.Dx E11.69, Disp: 1 each, Rfl: 0   Continuous Glucose Receiver (DEXCOM G7 RECEIVER) DEVI, Use to check blood glucose several times per  day, Disp: 1 each, Rfl: 0   Continuous Glucose Sensor (DEXCOM G7 SENSOR) MISC, Use to check blood glucose several times per day, Disp: 3 each, Rfl: 1   diclofenac  Sodium (VOLTAREN ) 1 % GEL, APPLY 2 GRAMS TOPICALLY 4 TIMES DAILY TO KNEE, Disp: 100 g, Rfl: 2   ELIQUIS  5 MG TABS tablet, TAKE ONE TABLET BY MOUTH TWICE DAILY (AM+PM), Disp: 60 tablet, Rfl: 1   famotidine  (PEPCID ) 20 MG tablet, Take 1 tablet (20 mg total) by mouth daily., Disp: 90 tablet, Rfl: 2   fluticasone  (FLONASE ) 50 MCG/ACT nasal spray, Place 1 spray into both nostrils daily., Disp: 16 g, Rfl: 12   Fluticasone -Umeclidin-Vilant (TRELEGY ELLIPTA ) 100-62.5-25 MCG/ACT AEPB, INHALE 1 PUFF INTO THE LUNGS DAILY, Disp: 180 each, Rfl: 0   formoterol  (PERFOROMIST ) 20 MCG/2ML nebulizer solution, Take 20 mcg by nebulization every 12 (twelve) hours., Disp: , Rfl:    glucose blood (ONETOUCH ULTRA) test strip, TESTING 8 TIMES DAILY, Disp: 800 strip, Rfl: 1   hydroxypropyl methylcellulose / hypromellose (ISOPTO TEARS / GONIOVISC) 2.5 % ophthalmic solution, Place 2 drops into both eyes as needed for dry eyes., Disp: 15 mL, Rfl: 12   insulin  glargine (LANTUS ) 100 UNIT/ML injection, Inject 24 Units into the skin daily., Disp: , Rfl:  insulin  lispro (HUMALOG ) 100 UNIT/ML injection, Inject 22 Units into the skin 3 (three) times daily before meals. (Patient taking differently: Inject 24 Units into the skin 3 (three) times daily before meals. Takes per sliding scale provided by Dr Faythe.), Disp: , Rfl:    lidocaine  (LIDODERM ) 5 %, Place 1 patch onto the skin daily. Remove & Discard patch within 12 hours or as directed by MD, Disp: 5 patch, Rfl: 0   linaclotide  (LINZESS ) 290 MCG CAPS capsule, Take 290 mcg by mouth as needed., Disp: , Rfl:    losartan  (COZAAR ) 100 MG tablet, Take 1 tablet (100 mg total) by mouth daily., Disp: 30 tablet, Rfl: 1   magnesium  oxide (MAG-OX) 400 MG tablet, Take 1 tablet (400 mg total) by mouth daily., Disp: 90 tablet, Rfl: 3    metoprolol  tartrate (LOPRESSOR ) 25 MG tablet, TAKE 1/2 TABLET BY MOUTH TWICE A DAY (1/2AM+1/2PM), Disp: 48 tablet, Rfl: 1   mupirocin  ointment (BACTROBAN ) 2 %, Apply 1 Application topically 2 (two) times daily. To affected area till better, Disp: 22 g, Rfl: 0   nitroGLYCERIN  (NITROSTAT ) 0.4 MG SL tablet, Place 1 tablet (0.4 mg total) under the tongue every 5 (five) minutes as needed for chest pain., Disp: 25 tablet, Rfl: 3   potassium chloride  SA (KLOR-CON  M) 20 MEQ tablet, Take 3 tablets (60 mEq total) by mouth in the morning AND 2 tablets (40 mEq total) every evening., Disp: 180 tablet, Rfl: 5   pregabalin  (LYRICA ) 25 MG capsule, Take 1 capsule (25 mg total) by mouth 2 (two) times daily., Disp: 60 capsule, Rfl: 5   rosuvastatin  (CRESTOR ) 20 MG tablet, TAKE 1 TABLET (20 MG TOTAL) BY MOUTH EVERY EVENING., Disp: 30 tablet, Rfl: 1   silver  sulfADIAZINE  (SSD) 1 % cream, APPLY PEA SIZED AMOUNT TOPICALLY TO WOUND DAILY, Disp: 50 g, Rfl: 0   spironolactone  (ALDACTONE ) 25 MG tablet, Take 1 tablet (25 mg total) by mouth daily., Disp: 90 tablet, Rfl: 3   torsemide  (DEMADEX ) 20 MG tablet, Take 2 tablets (40 mg total) by mouth 2 (two) times daily., Disp: 120 tablet, Rfl: 1  "

## 2024-02-23 ENCOUNTER — Ambulatory Visit: Admitting: Sports Medicine

## 2024-02-24 ENCOUNTER — Encounter: Payer: Self-pay | Admitting: Pulmonary Disease

## 2024-02-24 ENCOUNTER — Ambulatory Visit (INDEPENDENT_AMBULATORY_CARE_PROVIDER_SITE_OTHER): Admitting: Pulmonary Disease

## 2024-02-24 VITALS — BP 96/52 | HR 71 | Temp 97.7°F | Ht 71.0 in | Wt 294.2 lb

## 2024-02-24 DIAGNOSIS — G4733 Obstructive sleep apnea (adult) (pediatric): Secondary | ICD-10-CM

## 2024-02-24 DIAGNOSIS — R0902 Hypoxemia: Secondary | ICD-10-CM

## 2024-02-24 DIAGNOSIS — Z87891 Personal history of nicotine dependence: Secondary | ICD-10-CM

## 2024-02-24 DIAGNOSIS — I2729 Other secondary pulmonary hypertension: Secondary | ICD-10-CM

## 2024-02-24 DIAGNOSIS — J42 Unspecified chronic bronchitis: Secondary | ICD-10-CM

## 2024-02-24 DIAGNOSIS — G4736 Sleep related hypoventilation in conditions classified elsewhere: Secondary | ICD-10-CM

## 2024-02-24 DIAGNOSIS — I2781 Cor pulmonale (chronic): Secondary | ICD-10-CM

## 2024-02-24 NOTE — Patient Instructions (Signed)
" °  You will receive a call to schedule your Sleep Study.  ----------   Please schedule a follow up appointment with me when you check out today.  ----------   "

## 2024-02-24 NOTE — Progress Notes (Signed)
 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 Hx of SAR, CAD/MI/CABG, RBBB, CHF, HBP, T2DM, and cirrhosis.  On oxygen  2 L for sleep (DME: [facility]).  PFT 06/18/2011-moderate restriction. Flows are normal for measure volume with FEV1/FVC 0.84 and insignificant response to bronchodilator. DLCO 52%. HST-08/05/2016-AHI 37.5/hour, desaturation to 67%, body weight 294 pounds HST 09/08/18- AHI 32.4/ hr, desaturation to 73%- avg 89%, body weight 298 lbs TTE 07/31/2023- Normal RV systolic function, severely enlarged RV, with normal LVEF of 65%. Estimated PASP 44. Flattening of interventricular septum. TTE 11/30/2023- Mod reduced RV systolic function, severely enlarged RV size, and flattening of interventricular septum in systole and diastole, but normal LVEF of 60-65%, consistent with cor pulmonale. At least mod TR seen. Severe RA dilation. Estimated PASP 51.  CC:  Chief Complaint  Patient presents with   Medical Management of Chronic Issues    Pt is requesting a new CPAP machine. States new machine is over 33 years old. Has not used CPAP since heart surgery. Previously used [facility]    [name] [name] is a 69 y.o. patient who presents for follow up.  Last seen 01/2023 at which time he wasn't using CPAP.  Since last seen in Pulmonary clinic: - Most recent echo with new moderate reduction in RV systolic function c/f cor pulmonale.  Severe OSA: - Not on CPAP for 1-2 years. - Mask: Prefers nasal pillows mask. - Wants to resume CPAP use. Understands importance of CPAP for his cardiac health, particularly in his case (cor pulmonale, PH, sleep-related hypoxemia, OSA).  Hypoxemia: - Not on O2 at baseline, but historically has  needed 2 LPM at night (CPAP bleed-in).  Pulm Questionnaires:     11/01/2018    2:05 PM  Results of the Epworth flowsheet  Sitting and reading 2  Watching TV 2  Sitting, inactive in a public place (e.g. a theatre or a meeting) 3  As a passenger in a car for an hour without a break 3  Lying down to rest in the afternoon when circumstances permit 3  Sitting and talking to someone 1  Sitting quietly after a lunch without alcohol 2  In a car, while stopped for a few minutes in traffic 1  Total score 17        No data to display           ROS   Current Medications[1]      Objective:  BP (!) 96/52   Pulse 71   Temp 97.7 F (36.5 C)   Ht 5' 11 (1.803 m) Comment: Per pt  Wt 294 lb 3.2 oz (133.4 kg)   SpO2 94% Comment: RA  BMI 41.03 kg/m    Physical Exam Vitals reviewed.  Constitutional:      Appearance: Normal appearance. He is obese.  HENT:     Head: Normocephalic and atraumatic.     Mouth/Throat:     Mouth: Mucous membranes are moist.     Pharynx: Oropharynx is clear. No oropharyngeal exudate or posterior oropharyngeal erythema.  Eyes:     General: No scleral icterus.       Right eye: No discharge.        Left eye: No discharge.     Conjunctiva/sclera:  Conjunctivae normal.  Pulmonary:     Effort: Pulmonary effort is normal.     Breath sounds: No stridor. No wheezing, rhonchi or rales.  Musculoskeletal:     Right lower leg: Edema present.     Left lower leg: Edema present.     Comments: Requires walking frame; 2+ bilateral pitting edema.  Neurological:     Mental Status: He is alert and oriented to person, place, and time. Mental status is at baseline.  Psychiatric:        Behavior: Behavior normal.        Thought Content: Thought content normal.        Judgment: Judgment normal.      Diagnostic Review:    Serum HCO3: CO2  Date/Time Value Ref Range Status  01/28/2024 11:37 AM 26 22 - 32 mmol/L Final  12/18/2023 12:13 PM 25 22 - 32 mmol/L Final   11/30/2023 08:54 AM 23 20 - 29 mmol/L Final    Hemoglobin A1c:    Component Value Date/Time   HGBA1C 10.9 10/19/2023 0000   HGBA1C 12 04/16/2023 0000   HGBA1C 8.2 05/25/2022 0000    TSH: TSH  Date/Time Value Ref Range Status  10/07/2018 02:47 PM 3.06 0.35 - 4.50 uIU/mL Final  03/23/2018 12:00 AM 2.99 0.41 - 5.90 Final  03/23/2018 12:00 AM 2.99 0.41 - 5.90 Final  03/20/2017 03:14 PM 1.71 0.40 - 4.50 mIU/L Final    Iron Studies: No results found for: IRON, TIBC, IRONPCTSAT, FERRITIN  ABG:    Component Value Date/Time   HCO3 27.4 01/09/2023 0901   TCO2 29 01/09/2023 0901   O2SAT 74 01/09/2023 0901     VBG:    Component Value Date/Time   PHVEN 7.456 (H) 01/09/2023 0901   PCO2VEN 39.0 (L) 01/09/2023 0901   PO2VEN 37 01/09/2023 0901        Assessment & Plan:   Assessment & Plan OSA (obstructive sleep apnea) Ordered Split Night PSG to requalify patient for CPAP and determine optimal pressure and O2 bleed-in rate. Patient amenable to restarting CPAP. Prefers to use P-10 nasal pillows mask which is what he believes he used in the past. Poor surgical candidate for a solution such as Inspire which requires general anesthesia. Orders:   Split night study; Future  Hypoxemia associated with sleep Bleed-in O2 with CPAP following Split Night PSG above to determine updated needs.    Chronic bronchitis, unspecified chronic bronchitis type (HCC) Rx: Acapella device to assist with mucus clearance (patient endorses difficulty with this). Orders:   For home use only DME Other see comment  Other secondary pulmonary hypertension (HCC) Address Group III contributions with CPAP + bleed-in O2 as above. Following closely with cardiology.    Cor pulmonale (chronic) (HCC) See above.     Return in about 3 months (around 05/24/2024) for Follow up: CPAP.   Time spent on day of this encounter (includes time spent face-to-face with the patient as well as time spent the  same day as the encounter reviewing existing data and notes, and/or documenting my findings and the plan of care): 45 minutes  Extended duration of this follow up visit was due to this being my first visit with this patient (he was previously managed by my colleague Dr. Neysa who has since retired), combined with the fact that the patient has multiple serious and interrelated cardiopulmonary conditions that require careful review of multiple pieces of data / prior studies and notes from other providers.   Lamar JINNY Dales,  MD     [1]  Current Outpatient Medications:    Accu-Chek Softclix Lancets lancets, TEST MORNING, NOON AND BEDTIME, Disp: 100 each, Rfl: 3   albuterol  (VENTOLIN  HFA) 108 (90 Base) MCG/ACT inhaler, INHALE TWO PUFFS BY MOUTH EVERY SIX HOURS AS NEEDED FOR WHEEZING OR SHORTNESS OF BREATH, Disp: 8.5 g, Rfl: 5   B-D ULTRAFINE III SHORT PEN 31G X 8 MM MISC, USE AS DIRECTED, Disp: 100 each, Rfl: 5   Blood Glucose Monitoring Suppl DEVI, 1 each by Does not apply route in the morning, at noon, and at bedtime. May substitute to any manufacturer covered by patient's insurance.Dx E11.69, Disp: 1 each, Rfl: 0   Continuous Glucose Receiver (DEXCOM G7 RECEIVER) DEVI, Use to check blood glucose several times per day, Disp: 1 each, Rfl: 0   Continuous Glucose Sensor (DEXCOM G7 SENSOR) MISC, Use to check blood glucose several times per day, Disp: 3 each, Rfl: 1   diclofenac  Sodium (VOLTAREN ) 1 % GEL, APPLY 2 GRAMS TOPICALLY 4 TIMES DAILY TO KNEE, Disp: 100 g, Rfl: 2   ELIQUIS  5 MG TABS tablet, TAKE ONE TABLET BY MOUTH TWICE DAILY (AM+PM), Disp: 60 tablet, Rfl: 1   fluticasone  (FLONASE ) 50 MCG/ACT nasal spray, Place 1 spray into both nostrils daily., Disp: 16 g, Rfl: 12   Fluticasone -Umeclidin-Vilant (TRELEGY ELLIPTA ) 100-62.5-25 MCG/ACT AEPB, INHALE 1 PUFF INTO THE LUNGS DAILY, Disp: 180 each, Rfl: 0   formoterol  (PERFOROMIST ) 20 MCG/2ML nebulizer solution, Take 20 mcg by nebulization every 12  (twelve) hours., Disp: , Rfl:    glucose blood (ONETOUCH ULTRA) test strip, TESTING 8 TIMES DAILY, Disp: 800 strip, Rfl: 1   hydroxypropyl methylcellulose / hypromellose (ISOPTO TEARS / GONIOVISC) 2.5 % ophthalmic solution, Place 2 drops into both eyes as needed for dry eyes., Disp: 15 mL, Rfl: 12   insulin  glargine (LANTUS ) 100 UNIT/ML injection, Inject 24 Units into the skin daily., Disp: , Rfl:    insulin  lispro (HUMALOG ) 100 UNIT/ML injection, Inject 22 Units into the skin 3 (three) times daily before meals. (Patient taking differently: Inject 24 Units into the skin 3 (three) times daily before meals. Takes per sliding scale provided by Dr Faythe.), Disp: , Rfl:    lidocaine  (LIDODERM ) 5 %, Place 1 patch onto the skin daily. Remove & Discard patch within 12 hours or as directed by MD, Disp: 5 patch, Rfl: 0   linaclotide  (LINZESS ) 290 MCG CAPS capsule, Take 290 mcg by mouth as needed., Disp: , Rfl:    losartan  (COZAAR ) 100 MG tablet, Take 1 tablet (100 mg total) by mouth daily., Disp: 30 tablet, Rfl: 1   magnesium  oxide (MAG-OX) 400 MG tablet, Take 1 tablet (400 mg total) by mouth daily., Disp: 90 tablet, Rfl: 3   metoprolol  tartrate (LOPRESSOR ) 25 MG tablet, TAKE 1/2 TABLET BY MOUTH TWICE A DAY (1/2AM+1/2PM), Disp: 48 tablet, Rfl: 1   mupirocin  ointment (BACTROBAN ) 2 %, Apply 1 Application topically 2 (two) times daily. To affected area till better, Disp: 22 g, Rfl: 0   nitroGLYCERIN  (NITROSTAT ) 0.4 MG SL tablet, Place 1 tablet (0.4 mg total) under the tongue every 5 (five) minutes as needed for chest pain., Disp: 25 tablet, Rfl: 3   potassium chloride  SA (KLOR-CON  M) 20 MEQ tablet, Take 3 tablets (60 mEq total) by mouth in the morning AND 2 tablets (40 mEq total) every evening., Disp: 180 tablet, Rfl: 5   pregabalin  (LYRICA ) 25 MG capsule, Take 1 capsule (25 mg total) by mouth 2 (two) times  daily., Disp: 60 capsule, Rfl: 5   rosuvastatin  (CRESTOR ) 20 MG tablet, TAKE 1 TABLET (20 MG TOTAL) BY MOUTH  EVERY EVENING., Disp: 30 tablet, Rfl: 1   silver  sulfADIAZINE  (SSD) 1 % cream, APPLY PEA SIZED AMOUNT TOPICALLY TO WOUND DAILY, Disp: 50 g, Rfl: 0   spironolactone  (ALDACTONE ) 25 MG tablet, Take 1 tablet (25 mg total) by mouth daily., Disp: 90 tablet, Rfl: 3   torsemide  (DEMADEX ) 20 MG tablet, Take 2 tablets (40 mg total) by mouth 2 (two) times daily., Disp: 120 tablet, Rfl: 1   famotidine  (PEPCID ) 20 MG tablet, Take 1 tablet (20 mg total) by mouth daily. (Patient not taking: Reported on 02/24/2024), Disp: 90 tablet, Rfl: 2  "

## 2024-02-26 NOTE — Progress Notes (Unsigned)
 "               [name] [name] D.[name] [address] [place] [address] Phone: [phone]   Assessment and Plan:     1. DDD (degenerative disc disease), cervical (Primary) 2. Spinal stenosis in cervical region 3. Cervical disc disorder with radiculopathy of cervical region -Chronic with exacerbation, subsequent visit - Continued, chronic, progressive neck pain with left-sided radicular symptoms.  Consistent with multiple findings on C-spine MRI - Reviewed C-spine MRI with patient in clinic.  Discussed multiple findings of cervical stenosis, disc herniations likely causing neck pain, decreased neck ROM, left-sided upper extremity neuropathy - Use Tylenol  500 to 1000 mg tablets 2-3 times a day for day-to-day pain relief - May use tramadol  50 mg every 8 hours as needed for severe breakthrough pain.  Refill provided - Do not recommend prescribing NSAIDs or prednisone  due to past medical history of DM type II and chronic anticoagulation on Eliquis  - Recommend epidural CSI to left-sided C6-7.  If deemed appropriate, patient will need assistance with cardiology to safely hold Eliquis  for procedure.  Order placed - Patient has had negative side effects including drowsiness and hallucinations with gabapentin , muscle relaxers in the past, so I do not recommend repeating their use at this time - Continue HEP for neck  Pertinent previous records reviewed include C-spine MRI 02/02/2024 Impression: 1. Right paracentral disc protrusion at C3-4 with resultant mild to moderate spinal stenosis. 2. Multifactorial degenerative changes with resultant multilevel foraminal narrowing as above. Notable findings include moderate right worse than left C4 foraminal stenosis, severe left with moderate right C5 and C6 foraminal narrowing, with moderate to severe left greater than right C7 foraminal stenosis.   Follow Up: 2 weeks after epidural to review benefit.  Could consider  repeat epidural if necessary.  If no significant improvement, could consider neurosurgery versus pain management referral   Subjective:   I, Thomasene Dubow, am serving as a neurosurgeon for Doctor Morene [name]   Chief Complaint: neck pain    HPI:    06/03/23 Patient is a 69 year old male with neck pain. Patient states he fell last week. Neck pain that radiates to his head and ears. Decreased ROM due to pain. Feels like a cramp. Icy hot and tylenol  dont help. Pain does go down to his arm , but hx of neuropathy. Pain does radiate down his back     12/24/2023 Patient states pain has gotten worse since the last time he was in   02/29/2024 Patient states neck pain is back but has gotten worse    Relevant Historical Information: DM type II, hypertension, elevated BMI, PVD, chronic anticoagulation on Eliquis    Additional pertinent review of systems negative.  Current Medications[1]   Objective:     Vitals:   02/29/24 1003  Pulse: 94  SpO2: 96%  Weight: 294 lb (133.4 kg)  Height: 5' 11 (1.803 m)      Body mass index is 41 kg/m.    Physical Exam:    Neck Exam: Cervical Spine- Posture normal Skin- normal, intact   Neuro:  Strength-   Right Left  Deltoid (C5) 5/5 5/5 Bicep/Brachioradialis (C5/6) 5/5  5/5 Wrist Extension (C6) 5/5 5/5 Tricep (C7) 5/5 5/5 Wrist Flexion (C7) 5/5 5/5 Grip (C8) 5/5 5/5 Finger Abduction (T1) 5/5 5/5   Sensation: Decreased sensation in the left fingers compared to right the patient described as baseline.  Otherwise, sensation intact to light touch in  upper extremities   Spurling's:  negative bilaterally Neck ROM: Full active ROM TTP: Left cervical paraspinal, left trapezius NTTP: cervical spinous processes, right cervical paraspinal, thoracic paraspinal, right trapezius     Electronically signed by:  [name] [name] D.[name] 11:35 AM 02/29/24     [1]  Current Outpatient Medications:    Accu-Chek Softclix  Lancets lancets, TEST MORNING, NOON AND BEDTIME, Disp: 100 each, Rfl: 3   albuterol  (VENTOLIN  HFA) 108 (90 Base) MCG/ACT inhaler, INHALE TWO PUFFS BY MOUTH EVERY SIX HOURS AS NEEDED FOR WHEEZING OR SHORTNESS OF BREATH, Disp: 8.5 g, Rfl: 5   B-D ULTRAFINE III SHORT PEN 31G X 8 MM MISC, USE AS DIRECTED, Disp: 100 each, Rfl: 5   Blood Glucose Monitoring Suppl DEVI, 1 each by Does not apply route in the morning, at noon, and at bedtime. May substitute to any manufacturer covered by patient's insurance.Dx E11.69, Disp: 1 each, Rfl: 0   Continuous Glucose Receiver (DEXCOM G7 RECEIVER) DEVI, Use to check blood glucose several times per day, Disp: 1 each, Rfl: 0   Continuous Glucose Sensor (DEXCOM G7 SENSOR) MISC, Use to check blood glucose several times per day, Disp: 3 each, Rfl: 1   diclofenac  Sodium (VOLTAREN ) 1 % GEL, APPLY 2 GRAMS TOPICALLY 4 TIMES DAILY TO KNEE, Disp: 100 g, Rfl: 2   ELIQUIS  5 MG TABS tablet, TAKE ONE TABLET BY MOUTH TWICE DAILY (AM+PM), Disp: 60 tablet, Rfl: 1   fluticasone  (FLONASE ) 50 MCG/ACT nasal spray, Place 1 spray into both nostrils daily., Disp: 16 g, Rfl: 12   Fluticasone -Umeclidin-Vilant (TRELEGY ELLIPTA ) 100-62.5-25 MCG/ACT AEPB, INHALE 1 PUFF INTO THE LUNGS DAILY, Disp: 180 each, Rfl: 0   formoterol  (PERFOROMIST ) 20 MCG/2ML nebulizer solution, Take 20 mcg by nebulization every 12 (twelve) hours., Disp: , Rfl:    glucose blood (ONETOUCH ULTRA) test strip, TESTING 8 TIMES DAILY, Disp: 800 strip, Rfl: 1   hydroxypropyl methylcellulose / hypromellose (ISOPTO TEARS / GONIOVISC) 2.5 % ophthalmic solution, Place 2 drops into both eyes as needed for dry eyes., Disp: 15 mL, Rfl: 12   insulin  glargine (LANTUS ) 100 UNIT/ML injection, Inject 24 Units into the skin daily., Disp: , Rfl:    insulin  lispro (HUMALOG ) 100 UNIT/ML injection, Inject 22 Units into the skin 3 (three) times daily before meals. (Patient taking differently: Inject 24 Units into the skin 3 (three) times daily before  meals. Takes per sliding scale provided by Dr Faythe.), Disp: , Rfl:    lidocaine  (LIDODERM ) 5 %, Place 1 patch onto the skin daily. Remove & Discard patch within 12 hours or as directed by MD, Disp: 5 patch, Rfl: 0   linaclotide  (LINZESS ) 290 MCG CAPS capsule, Take 290 mcg by mouth as needed., Disp: , Rfl:    losartan  (COZAAR ) 100 MG tablet, Take 1 tablet (100 mg total) by mouth daily., Disp: 30 tablet, Rfl: 1   magnesium  oxide (MAG-OX) 400 MG tablet, Take 1 tablet (400 mg total) by mouth daily., Disp: 90 tablet, Rfl: 3   metoprolol  tartrate (LOPRESSOR ) 25 MG tablet, TAKE 1/2 TABLET BY MOUTH TWICE A DAY (1/2AM+1/2PM), Disp: 48 tablet, Rfl: 1   mupirocin  ointment (BACTROBAN ) 2 %, Apply 1 Application topically 2 (two) times daily. To affected area till better, Disp: 22 g, Rfl: 0   nitroGLYCERIN  (NITROSTAT ) 0.4 MG SL tablet, Place 1 tablet (0.4 mg total) under the tongue every 5 (five) minutes as needed for chest pain., Disp: 25 tablet, Rfl: 3   potassium chloride   SA (KLOR-CON  M) 20 MEQ tablet, Take 3 tablets (60 mEq total) by mouth in the morning AND 2 tablets (40 mEq total) every evening., Disp: 180 tablet, Rfl: 5   pregabalin  (LYRICA ) 25 MG capsule, Take 1 capsule (25 mg total) by mouth 2 (two) times daily., Disp: 60 capsule, Rfl: 5   rosuvastatin  (CRESTOR ) 20 MG tablet, TAKE 1 TABLET (20 MG TOTAL) BY MOUTH EVERY EVENING., Disp: 30 tablet, Rfl: 1   silver  sulfADIAZINE  (SSD) 1 % cream, APPLY PEA SIZED AMOUNT TOPICALLY TO WOUND DAILY, Disp: 50 g, Rfl: 0   spironolactone  (ALDACTONE ) 25 MG tablet, Take 1 tablet (25 mg total) by mouth daily., Disp: 90 tablet, Rfl: 3   torsemide  (DEMADEX ) 20 MG tablet, Take 2 tablets (40 mg total) by mouth 2 (two) times daily., Disp: 120 tablet, Rfl: 1   traMADol  (ULTRAM ) 50 MG tablet, Take 1 tablet (50 mg total) by mouth every 8 (eight) hours as needed for up to 5 days., Disp: 15 tablet, Rfl: 0  "

## 2024-02-29 ENCOUNTER — Other Ambulatory Visit: Payer: Self-pay | Admitting: Family Medicine

## 2024-02-29 ENCOUNTER — Ambulatory Visit: Admitting: Sports Medicine

## 2024-02-29 VITALS — HR 94 | Ht 71.0 in | Wt 294.0 lb

## 2024-02-29 DIAGNOSIS — M5010 Cervical disc disorder with radiculopathy, unspecified cervical region: Secondary | ICD-10-CM

## 2024-02-29 DIAGNOSIS — M5030 Other cervical disc degeneration, unspecified cervical region: Secondary | ICD-10-CM

## 2024-02-29 DIAGNOSIS — M4802 Spinal stenosis, cervical region: Secondary | ICD-10-CM

## 2024-02-29 MED ORDER — TRAMADOL HCL 50 MG PO TABS: 50.0000 mg | ORAL_TABLET | Freq: Three times a day (TID) | ORAL | 0 refills | Status: AC | PRN

## 2024-02-29 NOTE — Patient Instructions (Signed)
 Tylenol  [phone] mg 2-3 times a day for pain relief   Tramadol  as needed for severe breakthrough pain   Epidural left c6-7  Follow up 2 weeks after to discuss results

## 2024-03-01 ENCOUNTER — Telehealth: Payer: Self-pay | Admitting: Pharmacist

## 2024-03-01 ENCOUNTER — Other Ambulatory Visit: Payer: Self-pay | Admitting: Pharmacist

## 2024-03-01 NOTE — Telephone Encounter (Signed)
 Second outreach attempt was made to contact patient by phone today for follow up by Clinical Pharmacist regarding medication adherence / management.  Unable to reach patient. LM on VM with my contact number [phone].   I did speak with [facility]. They package his oral meds in bubble packs.  Reviewed med list and directions with [facility]. Verified patient's bubble packs match his current medication list. Tramdol is not included in bubble packs because he is to take as needed.

## 2024-03-11 ENCOUNTER — Other Ambulatory Visit (HOSPITAL_COMMUNITY): Payer: Self-pay

## 2024-03-11 NOTE — Progress Notes (Signed)
 Paramedicine Encounter    Patient ID: [name] [name], male    DOB: [birth year], 69 y.o.   MRN: [id number]   Complaints-neck pain-arthritis/disc pain   Edema-chronic   Compliance with meds-yes   Pill box filled-n/a If so, by whom-bubble packs -checked those and it was correct   Refills needed-none   Pt reports he is doing good. He denies increased sob,no dizziness, no c/p.  He reports neck/shoulder pains that he is being followed for with his disc issues and nerve pain. He was talking about getting a shot in his neck for this pain.  I checked his bubble packs and it was all good and accurate.  Reviewed upcoming appointments  I feel he can be d/c from paramedicine.  He has met all needs and no longer needs assistance from paramedicine. He is independent and functions well and manages his healthcare.    CBG-90-100s  BP (!) 110/58   Pulse 77   Resp 17   Wt 289 lb (131.1 kg)   SpO2 96%   BMI 40.31 kg/m  Weight yesterday-289  Last visit weight-289  Patient is now discharged from [facility].  Patient has/has not met the following goals:  Yes :Patient expresses basic understanding of medications and what they are for Yes :Patient able to verbalize heart failure specific dietary/fluid restrictions Yes :Patient is aware of who to call if they have medical concerns or if they need to schedule or change appts Yes :Patient has a scale for daily weights and weighs regularly Yes :Patient able to verbalize concerning symptoms when they should call the HF clinic (weight gain ranges, etc) Yes :Patient has a PCP and has seen within the past year or has upcoming appt Yes :Patient has reliable access to getting their medications Yes :Patient has shown they are able to reorder medications reliably No :Patient has had admission in past 30 days- if yes how many? No :Patient has had admission in past 90 days- if yes how many?    Patient Care Team: Kennyth Worth HERO, MD as  PCP - General (Family Medicine) Jeffrie Oneil BROCKS, MD as PCP - Cardiology (Cardiology) Faythe Purchase, MD as Consulting Physician (Endocrinology) Loreda Hacker, DPM as Consulting Physician (Podiatry) Neysa Reggy BIRCH, MD as Consulting Physician (Pulmonary Disease) [name], Elvie LITTIE, NP (Inactive) as Nurse Practitioner (Vascular Surgery) [facility] as Consulting Physician Carla Milling, RPH-CPP (Pharmacist)  Patient Active Problem List   Diagnosis Date Noted   GERD (gastroesophageal reflux disease) 11/06/2023   Stasis dermatitis 06/13/2022   Constipation 05/30/2022   Debility 05/30/2022   Severe tricuspid regurgitation 04/19/2022   Pulmonary embolism (HCC) 04/19/2022   Chronic right-sided heart failure (HCC) 04/19/2022   Decreased hearing 04/15/2022   Ectopic atrial tachycardia 02/12/2021   Simple chronic bronchitis (HCC) 12/17/2020   PND (post-nasal drip) 12/17/2020   Cough 10/31/2019   Intertrigo 10/31/2019   Coronary artery disease of bypass graft of native heart with stable angina pectoris    Type 2 diabetes mellitus with other specified complication (HCC), followed by Dr. Faythe 10/14/2018   Chronic heart failure with preserved ejection fraction (HCC) 08/29/2017   Varicose vein of leg 04/13/2017   Polyneuropathy associated with underlying disease    Osteoarthritis 01/21/2017   Seasonal and perennial allergic rhinitis 07/17/2016   Ulnar neuritis, left 03/27/2016   PVD (peripheral vascular disease) (HCC) 04/30/2009   Morbid obesity (HCC)    HLD (hyperlipidemia) 01/04/2007   Obstructive sleep apnea 01/04/2007   HTN (hypertension) 01/04/2007  Current Medications[1] Allergies[2]    Social History   Socioeconomic History   Marital status: Significant Other    Spouse name: Not on file   Number of children: Not on file   Years of education: Not on file   Highest education level: Not on file  Occupational History   Occupation: Disabled     Comment:  previously worked in delivery   Tobacco Use   Smoking status: Former    Current packs/day: 0.00    Average packs/day: 1.5 packs/day for 30.0 years (45.0 ttl pk-yrs)    Types: Cigarettes    Start date: 10/21/1974    Quit date: 10/20/2004    Years since quitting: 19.4    Passive exposure: Never   Smokeless tobacco: Never   Tobacco comments:    Smoked for about 45 years. Smoked about 1ppd. Quit 2006.  Vaping Use   Vaping status: Never Used  Substance and Sexual Activity   Alcohol use: Yes    Comment: occasional   Drug use: No   Sexual activity: Not Currently  Other Topics Concern   Not on file  Social History Narrative   Lives with significant other x 30+ years, apartment friends smoke. Disability. Is disabled secondary to his work-related injury.       Has over 20 grandchildren    Social Drivers of Health   Tobacco Use: Medium Risk (02/24/2024)   Patient History    Smoking Tobacco Use: Former    Smokeless Tobacco Use: Never    Passive Exposure: Never  Physicist, Medical Strain: Low Risk (12/04/2022)   Overall Financial Resource Strain (CARDIA)    Difficulty of Paying Living Expenses: Not hard at all  Food Insecurity: No Food Insecurity (07/17/2023)   Hunger Vital Sign    Worried About Running Out of Food in the Last Year: Never true    Ran Out of Food in the Last Year: Never true  Transportation Needs: Unmet Transportation Needs (12/18/2023)   Epic    Lack of Transportation (Medical): Yes    Lack of Transportation (Non-Medical): No  Physical Activity: Insufficiently Active (03/13/2023)   Exercise Vital Sign    Days of Exercise per Week: 5 days    Minutes of Exercise per Session: 10 min  Stress: No Stress Concern Present (12/04/2022)   [employer] of Occupational Health - Occupational Stress Questionnaire    Feeling of Stress : Not at all  Social Connections: Socially Isolated (02/18/2023)   Social Connection and Isolation Panel    Frequency of Communication with  Friends and Family: Three times a week    Frequency of Social Gatherings with Friends and Family: Three times a week    Attends Religious Services: Never    Active Member of Clubs or Organizations: No    Attends Banker Meetings: Never    Marital Status: Never married  Intimate Partner Violence: Not At Risk (07/17/2023)   Humiliation, Afraid, Rape, and Kick questionnaire    Fear of Current or Ex-Partner: No    Emotionally Abused: No    Physically Abused: No    Sexually Abused: No  Depression (PHQ2-9): Low Risk (11/06/2023)   Depression (PHQ2-9)    PHQ-2 Score: 0  Alcohol Screen: Low Risk (03/13/2023)   Alcohol Screen    Last Alcohol Screening Score (AUDIT): 0  Housing: Low Risk (07/17/2023)   Housing Stability Vital Sign    Unable to Pay for Housing in the Last Year: No    Number of Times Moved in  the Last Year: 0    Homeless in the Last Year: No  Utilities: Not At Risk (07/17/2023)   AHC Utilities    Threatened with loss of utilities: No  Health Literacy: Adequate Health Literacy (07/17/2023)   B1300 Health Literacy    Frequency of need for help with medical instructions: Never    Physical Exam      Future Appointments  Date Time Provider Department Center  04/12/2024 10:15 AM Christine Rush, DPM [facility]  04/25/2024  8:00 PM Stretch, Lamar PARAS, MD [facility]  04/27/2024  9:30 AM [facility] PA/NP [facility] None  05/26/2024  9:30 AM Stretch, Lamar PARAS, MD [facility] [id number]       Izetta Quivers, Paramedic [phone] [facility]  03/11/24     [1]  Current Outpatient Medications:    Accu-Chek Softclix Lancets lancets, TEST MORNING, NOON AND BEDTIME, Disp: 100 each, Rfl: 3   albuterol  (VENTOLIN  HFA) 108 (90 Base) MCG/ACT inhaler, INHALE TWO PUFFS BY MOUTH EVERY SIX HOURS AS NEEDED FOR WHEEZING OR SHORTNESS OF BREATH, Disp: 8.5 g, Rfl: 5   B-D ULTRAFINE III SHORT PEN 31G X 8 MM MISC, USE AS DIRECTED, Disp: 100 each, Rfl: 5   Blood  Glucose Monitoring Suppl DEVI, 1 each by Does not apply route in the morning, at noon, and at bedtime. May substitute to any manufacturer covered by patient's insurance.Dx E11.69, Disp: 1 each, Rfl: 0   Continuous Glucose Receiver (DEXCOM G7 RECEIVER) DEVI, Use to check blood glucose several times per day, Disp: 1 each, Rfl: 0   Continuous Glucose Sensor (DEXCOM G7 SENSOR) MISC, Use to check blood glucose several times per day, Disp: 3 each, Rfl: 1   diclofenac  Sodium (VOLTAREN ) 1 % GEL, APPLY 2 GRAMS TOPICALLY 4 TIMES DAILY TO KNEE, Disp: 100 g, Rfl: 2   ELIQUIS  5 MG TABS tablet, TAKE ONE TABLET BY MOUTH TWICE DAILY (AM+PM), Disp: 60 tablet, Rfl: 1   famotidine  (PEPCID ) 20 MG tablet, Take 20 mg by mouth daily., Disp: , Rfl:    fluticasone  (FLONASE ) 50 MCG/ACT nasal spray, Place 1 spray into both nostrils daily., Disp: 16 g, Rfl: 12   Fluticasone -Umeclidin-Vilant (TRELEGY ELLIPTA ) 100-62.5-25 MCG/ACT AEPB, INHALE 1 PUFF INTO THE LUNGS DAILY, Disp: 180 each, Rfl: 0   formoterol  (PERFOROMIST ) 20 MCG/2ML nebulizer solution, Take 20 mcg by nebulization every 12 (twelve) hours., Disp: , Rfl:    glucose blood (ONETOUCH ULTRA) test strip, TESTING 8 TIMES DAILY, Disp: 800 strip, Rfl: 1   insulin  glargine (LANTUS ) 100 UNIT/ML injection, Inject 24 Units into the skin daily., Disp: , Rfl:    lidocaine  (LIDODERM ) 5 %, Place 1 patch onto the skin daily. Remove & Discard patch within 12 hours or as directed by MD, Disp: 5 patch, Rfl: 0   linaclotide  (LINZESS ) 290 MCG CAPS capsule, Take 290 mcg by mouth as needed., Disp: , Rfl:    losartan  (COZAAR ) 100 MG tablet, TAKE 1 TABLET (100 MG TOTAL) BY MOUTH DAILY.(AM), Disp: 30 tablet, Rfl: 1   magnesium  oxide (MAG-OX) 400 MG tablet, Take 1 tablet (400 mg total) by mouth daily., Disp: 90 tablet, Rfl: 3   metoprolol  tartrate (LOPRESSOR ) 25 MG tablet, TAKE 1/2 TABLET BY MOUTH TWICE A DAY (1/2AM+1/2PM), Disp: 48 tablet, Rfl: 1   mupirocin  ointment (BACTROBAN ) 2 %, Apply 1  Application topically 2 (two) times daily. To affected area till better, Disp: 22 g, Rfl: 0   potassium chloride  SA (KLOR-CON  M) 20 MEQ tablet, Take 3  tablets (60 mEq total) by mouth in the morning AND 2 tablets (40 mEq total) every evening., Disp: 180 tablet, Rfl: 5   rosuvastatin  (CRESTOR ) 20 MG tablet, TAKE 1 TABLET (20 MG TOTAL) BY MOUTH EVERY EVENING., Disp: 30 tablet, Rfl: 1   spironolactone  (ALDACTONE ) 25 MG tablet, Take 1 tablet (25 mg total) by mouth daily., Disp: 90 tablet, Rfl: 3   torsemide  (DEMADEX ) 20 MG tablet, Take 2 tablets (40 mg total) by mouth 2 (two) times daily., Disp: 120 tablet, Rfl: 1   hydroxypropyl methylcellulose / hypromellose (ISOPTO TEARS / GONIOVISC) 2.5 % ophthalmic solution, Place 2 drops into both eyes as needed for dry eyes., Disp: 15 mL, Rfl: 12   insulin  lispro (HUMALOG ) 100 UNIT/ML injection, Inject 22 Units into the skin 3 (three) times daily before meals. (Patient taking differently: Inject 24 Units into the skin 3 (three) times daily before meals. Takes per sliding scale provided by Dr Faythe.), Disp: , Rfl:    nitroGLYCERIN  (NITROSTAT ) 0.4 MG SL tablet, Place 1 tablet (0.4 mg total) under the tongue every 5 (five) minutes as needed for chest pain., Disp: 25 tablet, Rfl: 3   pregabalin  (LYRICA ) 25 MG capsule, Take 1 capsule (25 mg total) by mouth 2 (two) times daily. (Patient not taking: Reported on 03/11/2024), Disp: 60 capsule, Rfl: 5   silver  sulfADIAZINE  (SSD) 1 % cream, APPLY PEA SIZED AMOUNT TOPICALLY TO WOUND DAILY, Disp: 50 g, Rfl: 0 [2]  Allergies Allergen Reactions   Empagliflozin  Rash    Patient reports yeast.    Ibuprofen Other (See Comments)    Stomach pain

## 2024-03-14 ENCOUNTER — Encounter (HOSPITAL_COMMUNITY): Payer: Self-pay | Admitting: Adult Health

## 2024-03-14 NOTE — Progress Notes (Unsigned)
" °  HF [facility] Discharge  Discussed with HF Paramedic and HF Team.   Goals achieved.Can refer again if new needs identified.   Stable for discharge. Patient aware.   Simi Briel NP-C  8:18 AM   "

## 2024-03-17 ENCOUNTER — Telehealth: Payer: Self-pay | Admitting: *Deleted

## 2024-03-17 ENCOUNTER — Telehealth: Payer: Self-pay | Admitting: Sports Medicine

## 2024-03-17 DIAGNOSIS — M5010 Cervical disc disorder with radiculopathy, unspecified cervical region: Secondary | ICD-10-CM

## 2024-03-17 DIAGNOSIS — M4802 Spinal stenosis, cervical region: Secondary | ICD-10-CM

## 2024-03-17 DIAGNOSIS — M5030 Other cervical disc degeneration, unspecified cervical region: Secondary | ICD-10-CM

## 2024-03-17 DIAGNOSIS — M542 Cervicalgia: Secondary | ICD-10-CM

## 2024-03-17 MED ORDER — TRAMADOL HCL 50 MG PO TABS: 50.0000 mg | ORAL_TABLET | Freq: Three times a day (TID) | ORAL | 2 refills | Status: AC | PRN

## 2024-03-17 NOTE — Telephone Encounter (Addendum)
 Copied from CRM [id number]. Topic: Clinical - Medication Question >> Mar 17, 2024 11:48 AM Revonda D wrote: Reason for CRM: Pt is requesting to speak with Dr.Parker's nurse in regards to an injection that his sports medicine provider is giving him. Pt would like a callback today if possible.   Left message to return call to our office at their convenience.  [name] [name],RMA  Spoke with patient. Stated he is having a steroid Inj in his back and want to know if is ok for him to proceed with inj. Has to stop Eliquis  2 days prior to steroid Inj  Please advise  [name] [name],RMA

## 2024-03-17 NOTE — Telephone Encounter (Signed)
 Patient called the office asking to speak to Dr Leonce assistant in regards to his neck. Call was transferred to [facility].

## 2024-03-18 NOTE — Telephone Encounter (Signed)
 Yes it is ok for him to hold for a couple of days.

## 2024-03-18 NOTE — Telephone Encounter (Signed)
 Patient aware  it is ok for him to hold for a couple of days. Verbalized understanding

## 2024-03-21 ENCOUNTER — Other Ambulatory Visit

## 2024-03-22 ENCOUNTER — Other Ambulatory Visit

## 2024-04-12 ENCOUNTER — Ambulatory Visit: Admitting: Podiatry

## 2024-04-25 ENCOUNTER — Ambulatory Visit (HOSPITAL_BASED_OUTPATIENT_CLINIC_OR_DEPARTMENT_OTHER): Admitting: Pulmonary Disease

## 2024-04-27 ENCOUNTER — Ambulatory Visit (HOSPITAL_COMMUNITY)

## 2024-05-26 ENCOUNTER — Ambulatory Visit: Admitting: Pulmonary Disease
